# Patient Record
Sex: Male | Born: 1935 | Race: White | Hispanic: No
Health system: Southern US, Community
[De-identification: ages and names within clinical notes are randomized; demographics above are authoritative.]

## PROBLEM LIST (undated history)

## (undated) ENCOUNTER — Emergency Department (HOSPITAL_COMMUNITY): Payer: Self-pay | Source: Home / Self Care

## (undated) DIAGNOSIS — L578 Other skin changes due to chronic exposure to nonionizing radiation: Secondary | ICD-10-CM

## (undated) DIAGNOSIS — E785 Hyperlipidemia, unspecified: Secondary | ICD-10-CM

## (undated) DIAGNOSIS — I10 Essential (primary) hypertension: Secondary | ICD-10-CM

## (undated) DIAGNOSIS — I712 Thoracic aortic aneurysm, without rupture, unspecified: Secondary | ICD-10-CM

## (undated) DIAGNOSIS — K635 Polyp of colon: Secondary | ICD-10-CM

## (undated) DIAGNOSIS — K648 Other hemorrhoids: Secondary | ICD-10-CM

## (undated) DIAGNOSIS — I4891 Unspecified atrial fibrillation: Secondary | ICD-10-CM

## (undated) DIAGNOSIS — Z79899 Other long term (current) drug therapy: Secondary | ICD-10-CM

## (undated) DIAGNOSIS — J189 Pneumonia, unspecified organism: Secondary | ICD-10-CM

## (undated) DIAGNOSIS — I493 Ventricular premature depolarization: Secondary | ICD-10-CM

## (undated) DIAGNOSIS — I739 Peripheral vascular disease, unspecified: Secondary | ICD-10-CM

## (undated) DIAGNOSIS — R911 Solitary pulmonary nodule: Secondary | ICD-10-CM

## (undated) DIAGNOSIS — E7800 Pure hypercholesterolemia, unspecified: Secondary | ICD-10-CM

## (undated) DIAGNOSIS — K5730 Diverticulosis of large intestine without perforation or abscess without bleeding: Secondary | ICD-10-CM

## (undated) DIAGNOSIS — M1990 Unspecified osteoarthritis, unspecified site: Secondary | ICD-10-CM

## (undated) DIAGNOSIS — J449 Chronic obstructive pulmonary disease, unspecified: Secondary | ICD-10-CM

## (undated) DIAGNOSIS — C61 Malignant neoplasm of prostate: Secondary | ICD-10-CM

## (undated) DIAGNOSIS — I509 Heart failure, unspecified: Secondary | ICD-10-CM

## (undated) HISTORY — DX: Thoracic aortic aneurysm, without rupture, unspecified: I71.20

## (undated) HISTORY — DX: Pure hypercholesterolemia, unspecified: E78.00

## (undated) HISTORY — DX: Heart failure, unspecified: I50.9

## (undated) HISTORY — DX: Polyp of colon: K63.5

## (undated) HISTORY — DX: Diverticulosis of large intestine without perforation or abscess without bleeding: K57.30

## (undated) HISTORY — DX: Other hemorrhoids: K64.8

## (undated) HISTORY — DX: Other long term (current) drug therapy: Z79.899

## (undated) HISTORY — DX: Malignant neoplasm of prostate: C61

## (undated) HISTORY — DX: Solitary pulmonary nodule: R91.1

## (undated) HISTORY — DX: Unspecified atrial fibrillation: I48.91

## (undated) HISTORY — DX: Essential (primary) hypertension: I10

## (undated) HISTORY — DX: Hyperlipidemia, unspecified: E78.5

## (undated) HISTORY — DX: Ventricular premature depolarization: I49.3

## (undated) HISTORY — DX: Chronic obstructive pulmonary disease, unspecified: J44.9

## (undated) HISTORY — DX: Peripheral vascular disease, unspecified: I73.9

## (undated) HISTORY — DX: Pneumonia, unspecified organism: J18.9

## (undated) HISTORY — PX: JOINT REPLACEMENT: SHX530

## (undated) HISTORY — PX: APPENDECTOMY: SHX54

## (undated) HISTORY — DX: Other skin changes due to chronic exposure to nonionizing radiation: L57.8

## (undated) HISTORY — DX: Thoracic aortic aneurysm, without rupture: I71.2

## (undated) HISTORY — PX: TOTAL KNEE ARTHROPLASTY: SHX125

## (undated) HISTORY — DX: Unspecified osteoarthritis, unspecified site: M19.90

## (undated) HISTORY — PX: INGUINAL HERNIA REPAIR: SUR1180

---

## 1996-09-25 HISTORY — PX: ABDOMINAL AORTIC ANEURYSM REPAIR: SUR1152

## 1999-02-22 ENCOUNTER — Other Ambulatory Visit: Admission: RE | Admit: 1999-02-22 | Discharge: 1999-02-22 | Payer: Self-pay | Admitting: Internal Medicine

## 2003-10-13 ENCOUNTER — Inpatient Hospital Stay (HOSPITAL_COMMUNITY): Admission: RE | Admit: 2003-10-13 | Discharge: 2003-10-16 | Payer: Self-pay | Admitting: Orthopedic Surgery

## 2004-07-29 ENCOUNTER — Ambulatory Visit: Payer: Self-pay

## 2004-08-12 ENCOUNTER — Ambulatory Visit: Payer: Self-pay | Admitting: Cardiovascular Disease

## 2004-08-12 ENCOUNTER — Ambulatory Visit (HOSPITAL_COMMUNITY): Admission: RE | Admit: 2004-08-12 | Discharge: 2004-08-12 | Payer: Self-pay | Admitting: Cardiovascular Disease

## 2004-11-30 ENCOUNTER — Ambulatory Visit: Payer: Self-pay | Admitting: Pulmonary Disease

## 2005-01-11 ENCOUNTER — Ambulatory Visit: Payer: Self-pay | Admitting: Pulmonary Disease

## 2005-04-12 ENCOUNTER — Ambulatory Visit: Payer: Self-pay | Admitting: Pulmonary Disease

## 2005-04-14 ENCOUNTER — Ambulatory Visit: Payer: Self-pay | Admitting: Internal Medicine

## 2005-05-11 ENCOUNTER — Encounter: Admission: RE | Admit: 2005-05-11 | Discharge: 2005-05-11 | Payer: Self-pay | Admitting: Cardiothoracic Surgery

## 2005-05-31 ENCOUNTER — Ambulatory Visit: Payer: Self-pay | Admitting: Internal Medicine

## 2005-07-12 ENCOUNTER — Emergency Department (HOSPITAL_COMMUNITY): Admission: EM | Admit: 2005-07-12 | Discharge: 2005-07-12 | Payer: Self-pay | Admitting: Emergency Medicine

## 2005-08-14 ENCOUNTER — Ambulatory Visit: Payer: Self-pay | Admitting: Pulmonary Disease

## 2006-02-12 ENCOUNTER — Ambulatory Visit: Payer: Self-pay | Admitting: Pulmonary Disease

## 2006-02-13 ENCOUNTER — Ambulatory Visit: Payer: Self-pay | Admitting: Pulmonary Disease

## 2006-05-17 ENCOUNTER — Encounter: Admission: RE | Admit: 2006-05-17 | Discharge: 2006-05-17 | Payer: Self-pay | Admitting: Cardiothoracic Surgery

## 2006-08-15 ENCOUNTER — Ambulatory Visit: Payer: Self-pay | Admitting: Pulmonary Disease

## 2007-02-13 ENCOUNTER — Ambulatory Visit: Payer: Self-pay | Admitting: Pulmonary Disease

## 2007-02-13 LAB — CONVERTED CEMR LAB
ALT: 35 units/L (ref 0–40)
AST: 43 units/L — ABNORMAL HIGH (ref 0–37)
Albumin: 4 g/dL (ref 3.5–5.2)
Alkaline Phosphatase: 109 units/L (ref 39–117)
BUN: 16 mg/dL (ref 6–23)
Basophils Absolute: 0.1 10*3/uL (ref 0.0–0.1)
Basophils Relative: 1.3 % — ABNORMAL HIGH (ref 0.0–1.0)
Bilirubin, Direct: 0.4 mg/dL — ABNORMAL HIGH (ref 0.0–0.3)
CO2: 30 meq/L (ref 19–32)
Calcium: 9.4 mg/dL (ref 8.4–10.5)
Chloride: 108 meq/L (ref 96–112)
Cholesterol: 118 mg/dL (ref 0–200)
Creatinine, Ser: 1.1 mg/dL (ref 0.4–1.5)
Eosinophils Absolute: 0.1 10*3/uL (ref 0.0–0.6)
Eosinophils Relative: 1.7 % (ref 0.0–5.0)
GFR calc Af Amer: 83 mL/min
GFR calc non Af Amer: 68 mL/min
Glucose, Bld: 88 mg/dL (ref 70–99)
HCT: 44.8 % (ref 39.0–52.0)
HDL: 30.9 mg/dL — ABNORMAL LOW (ref >39.0)
Hemoglobin: 15.4 g/dL (ref 13.0–17.0)
LDL Cholesterol: 74 mg/dL (ref 0–99)
Lymphocytes Relative: 28.3 % (ref 12.0–46.0)
MCHC: 34.3 g/dL (ref 30.0–36.0)
MCV: 93.4 fL (ref 78.0–100.0)
Monocytes Absolute: 0.8 10*3/uL — ABNORMAL HIGH (ref 0.2–0.7)
Monocytes Relative: 13 % — ABNORMAL HIGH (ref 3.0–11.0)
Neutro Abs: 3.2 10*3/uL (ref 1.4–7.7)
Neutrophils Relative %: 55.7 % (ref 43.0–77.0)
PSA: 3.52 ng/mL (ref 0.10–4.00)
Platelets: 196 10*3/uL (ref 150–400)
Potassium: 4.8 meq/L (ref 3.5–5.1)
RBC: 4.8 M/uL (ref 4.22–5.81)
RDW: 13.2 % (ref 11.5–14.6)
Sodium: 142 meq/L (ref 135–145)
TSH: 2.81 microintl units/mL (ref 0.35–5.50)
Total Bilirubin: 2.9 mg/dL — ABNORMAL HIGH (ref 0.3–1.2)
Total CHOL/HDL Ratio: 3.8
Total Protein: 7 g/dL (ref 6.0–8.3)
Triglycerides: 66 mg/dL (ref 0–149)
VLDL: 13 mg/dL (ref 0–40)
WBC: 5.9 10*3/uL (ref 4.5–10.5)

## 2007-03-18 ENCOUNTER — Ambulatory Visit (HOSPITAL_COMMUNITY): Admission: RE | Admit: 2007-03-18 | Discharge: 2007-03-18 | Payer: Self-pay | Admitting: Urology

## 2007-04-01 ENCOUNTER — Ambulatory Visit: Admission: RE | Admit: 2007-04-01 | Discharge: 2007-06-25 | Payer: Self-pay | Admitting: Radiation Oncology

## 2007-07-25 ENCOUNTER — Ambulatory Visit: Payer: Self-pay | Admitting: Cardiothoracic Surgery

## 2007-07-25 ENCOUNTER — Encounter: Admission: RE | Admit: 2007-07-25 | Discharge: 2007-07-25 | Payer: Self-pay | Admitting: Cardiothoracic Surgery

## 2007-08-16 ENCOUNTER — Ambulatory Visit: Payer: Self-pay | Admitting: Pulmonary Disease

## 2007-08-19 ENCOUNTER — Encounter: Payer: Self-pay | Admitting: Pulmonary Disease

## 2007-08-19 DIAGNOSIS — E7800 Pure hypercholesterolemia, unspecified: Secondary | ICD-10-CM | POA: Insufficient documentation

## 2007-08-19 DIAGNOSIS — I10 Essential (primary) hypertension: Secondary | ICD-10-CM | POA: Insufficient documentation

## 2007-08-19 DIAGNOSIS — M1990 Unspecified osteoarthritis, unspecified site: Secondary | ICD-10-CM | POA: Insufficient documentation

## 2007-10-23 ENCOUNTER — Encounter: Payer: Self-pay | Admitting: Pulmonary Disease

## 2007-10-25 ENCOUNTER — Ambulatory Visit: Admission: RE | Admit: 2007-10-25 | Discharge: 2008-01-23 | Payer: Self-pay | Admitting: Radiation Oncology

## 2007-10-29 ENCOUNTER — Encounter: Payer: Self-pay | Admitting: Pulmonary Disease

## 2007-12-12 ENCOUNTER — Encounter: Payer: Self-pay | Admitting: Pulmonary Disease

## 2008-01-23 ENCOUNTER — Ambulatory Visit: Admission: RE | Admit: 2008-01-23 | Discharge: 2008-02-09 | Payer: Self-pay | Admitting: Radiation Oncology

## 2008-02-04 ENCOUNTER — Encounter: Payer: Self-pay | Admitting: Pulmonary Disease

## 2008-02-12 ENCOUNTER — Ambulatory Visit: Payer: Self-pay | Admitting: Pulmonary Disease

## 2008-02-12 DIAGNOSIS — I739 Peripheral vascular disease, unspecified: Secondary | ICD-10-CM | POA: Insufficient documentation

## 2008-02-12 DIAGNOSIS — J4489 Other specified chronic obstructive pulmonary disease: Secondary | ICD-10-CM | POA: Insufficient documentation

## 2008-02-12 DIAGNOSIS — K5730 Diverticulosis of large intestine without perforation or abscess without bleeding: Secondary | ICD-10-CM | POA: Insufficient documentation

## 2008-02-12 DIAGNOSIS — L578 Other skin changes due to chronic exposure to nonionizing radiation: Secondary | ICD-10-CM | POA: Insufficient documentation

## 2008-02-12 DIAGNOSIS — D126 Benign neoplasm of colon, unspecified: Secondary | ICD-10-CM | POA: Insufficient documentation

## 2008-02-12 DIAGNOSIS — J449 Chronic obstructive pulmonary disease, unspecified: Secondary | ICD-10-CM | POA: Insufficient documentation

## 2008-02-12 DIAGNOSIS — C61 Malignant neoplasm of prostate: Secondary | ICD-10-CM | POA: Insufficient documentation

## 2008-02-16 LAB — CONVERTED CEMR LAB
ALT: 32 units/L (ref 0–53)
AST: 40 units/L — ABNORMAL HIGH (ref 0–37)
Albumin: 4.1 g/dL (ref 3.5–5.2)
Alkaline Phosphatase: 95 units/L (ref 39–117)
BUN: 15 mg/dL (ref 6–23)
Basophils Absolute: 0 10*3/uL (ref 0.0–0.1)
Basophils Relative: 0.2 % (ref 0.0–1.0)
Bilirubin, Direct: 0.4 mg/dL — ABNORMAL HIGH (ref 0.0–0.3)
CO2: 30 meq/L (ref 19–32)
Calcium: 9.8 mg/dL (ref 8.4–10.5)
Chloride: 105 meq/L (ref 96–112)
Cholesterol: 126 mg/dL (ref 0–200)
Creatinine, Ser: 1.3 mg/dL (ref 0.4–1.5)
Eosinophils Absolute: 0.1 10*3/uL (ref 0.0–0.7)
Eosinophils Relative: 1.6 % (ref 0.0–5.0)
GFR calc Af Amer: 68 mL/min
GFR calc non Af Amer: 56 mL/min
Glucose, Bld: 108 mg/dL — ABNORMAL HIGH (ref 70–99)
HCT: 48.5 % (ref 39.0–52.0)
HDL: 37.1 mg/dL — ABNORMAL LOW (ref >39.0)
Hemoglobin: 16.1 g/dL (ref 13.0–17.0)
LDL Cholesterol: 75 mg/dL (ref 0–99)
Lymphocytes Relative: 14.4 % (ref 12.0–46.0)
MCHC: 33.3 g/dL (ref 30.0–36.0)
MCV: 96.1 fL (ref 78.0–100.0)
Monocytes Absolute: 0.9 10*3/uL (ref 0.1–1.0)
Monocytes Relative: 11.5 % (ref 3.0–12.0)
Neutro Abs: 5.5 10*3/uL (ref 1.4–7.7)
Neutrophils Relative %: 72.3 % (ref 43.0–77.0)
Platelets: 176 10*3/uL (ref 150–400)
Potassium: 5.1 meq/L (ref 3.5–5.1)
RBC: 5.04 M/uL (ref 4.22–5.81)
RDW: 13.5 % (ref 11.5–14.6)
Sodium: 142 meq/L (ref 135–145)
TSH: 3 microintl units/mL (ref 0.35–5.50)
Total Bilirubin: 2.6 mg/dL — ABNORMAL HIGH (ref 0.3–1.2)
Total CHOL/HDL Ratio: 3.4
Total Protein: 7.3 g/dL (ref 6.0–8.3)
Triglycerides: 71 mg/dL (ref 0–149)
VLDL: 14 mg/dL (ref 0–40)
WBC: 7.6 10*3/uL (ref 4.5–10.5)

## 2008-03-11 ENCOUNTER — Encounter: Payer: Self-pay | Admitting: Pulmonary Disease

## 2008-03-16 ENCOUNTER — Telehealth (INDEPENDENT_AMBULATORY_CARE_PROVIDER_SITE_OTHER): Payer: Self-pay | Admitting: *Deleted

## 2008-03-17 ENCOUNTER — Ambulatory Visit: Payer: Self-pay | Admitting: Pulmonary Disease

## 2008-03-17 ENCOUNTER — Encounter: Payer: Self-pay | Admitting: Adult Health

## 2008-03-17 DIAGNOSIS — I4949 Other premature depolarization: Secondary | ICD-10-CM | POA: Insufficient documentation

## 2008-03-18 ENCOUNTER — Ambulatory Visit: Payer: Self-pay | Admitting: Cardiovascular Disease

## 2008-03-24 ENCOUNTER — Ambulatory Visit: Payer: Self-pay

## 2008-03-24 ENCOUNTER — Encounter: Payer: Self-pay | Admitting: Cardiovascular Disease

## 2008-03-24 ENCOUNTER — Encounter: Payer: Self-pay | Admitting: Pulmonary Disease

## 2008-05-07 ENCOUNTER — Ambulatory Visit: Payer: Self-pay | Admitting: Cardiovascular Disease

## 2008-05-07 LAB — CONVERTED CEMR LAB
BUN: 22 mg/dL (ref 6–23)
CO2: 28 meq/L (ref 19–32)
Calcium: 9.4 mg/dL (ref 8.4–10.5)
Chloride: 107 meq/L (ref 96–112)
Creatinine, Ser: 1.1 mg/dL (ref 0.4–1.5)
GFR calc Af Amer: 82 mL/min
GFR calc non Af Amer: 68 mL/min
Glucose, Bld: 98 mg/dL (ref 70–99)
Potassium: 4.4 meq/L (ref 3.5–5.1)
Sodium: 141 meq/L (ref 135–145)

## 2008-07-29 ENCOUNTER — Ambulatory Visit: Payer: Self-pay | Admitting: Cardiovascular Disease

## 2008-08-07 ENCOUNTER — Encounter: Payer: Self-pay | Admitting: Pulmonary Disease

## 2008-08-18 ENCOUNTER — Encounter: Payer: Self-pay | Admitting: Pulmonary Disease

## 2008-08-26 ENCOUNTER — Ambulatory Visit: Payer: Self-pay | Admitting: Pulmonary Disease

## 2009-01-14 ENCOUNTER — Encounter: Payer: Self-pay | Admitting: Pulmonary Disease

## 2009-01-14 ENCOUNTER — Ambulatory Visit: Payer: Self-pay | Admitting: Cardiothoracic Surgery

## 2009-01-14 ENCOUNTER — Encounter: Admission: RE | Admit: 2009-01-14 | Discharge: 2009-01-14 | Payer: Self-pay | Admitting: Cardiothoracic Surgery

## 2009-02-08 ENCOUNTER — Encounter: Payer: Self-pay | Admitting: Pulmonary Disease

## 2009-02-10 ENCOUNTER — Ambulatory Visit: Payer: Self-pay | Admitting: Cardiovascular Disease

## 2009-03-15 ENCOUNTER — Ambulatory Visit: Payer: Self-pay | Admitting: Pulmonary Disease

## 2009-03-17 LAB — CONVERTED CEMR LAB
ALT: 30 units/L (ref 0–53)
AST: 41 units/L — ABNORMAL HIGH (ref 0–37)
Albumin: 3.8 g/dL (ref 3.5–5.2)
Alkaline Phosphatase: 120 units/L — ABNORMAL HIGH (ref 39–117)
BUN: 16 mg/dL (ref 6–23)
Basophils Absolute: 0 10*3/uL (ref 0.0–0.1)
Basophils Relative: 0.1 % (ref 0.0–3.0)
Bilirubin, Direct: 0.4 mg/dL — ABNORMAL HIGH (ref 0.0–0.3)
CO2: 30 meq/L (ref 19–32)
Calcium: 9.2 mg/dL (ref 8.4–10.5)
Chloride: 104 meq/L (ref 96–112)
Cholesterol: 116 mg/dL (ref 0–200)
Creatinine, Ser: 1.1 mg/dL (ref 0.4–1.5)
Eosinophils Absolute: 0.2 10*3/uL (ref 0.0–0.7)
Eosinophils Relative: 3.3 % (ref 0.0–5.0)
GFR calc non Af Amer: 67.88 mL/min (ref >60)
Glucose, Bld: 99 mg/dL (ref 70–99)
HCT: 42.4 % (ref 39.0–52.0)
HDL: 39.7 mg/dL (ref >39.00)
Hemoglobin: 14.9 g/dL (ref 13.0–17.0)
LDL Cholesterol: 62 mg/dL (ref 0–99)
Lymphocytes Relative: 19.6 % (ref 12.0–46.0)
Lymphs Abs: 1.4 10*3/uL (ref 0.7–4.0)
MCHC: 35.1 g/dL (ref 30.0–36.0)
MCV: 95.8 fL (ref 78.0–100.0)
Monocytes Absolute: 0.7 10*3/uL (ref 0.1–1.0)
Monocytes Relative: 10.4 % (ref 3.0–12.0)
Neutro Abs: 4.6 10*3/uL (ref 1.4–7.7)
Neutrophils Relative %: 66.6 % (ref 43.0–77.0)
Platelets: 171 10*3/uL (ref 150.0–400.0)
Potassium: 4.6 meq/L (ref 3.5–5.1)
RBC: 4.43 M/uL (ref 4.22–5.81)
RDW: 13.5 % (ref 11.5–14.6)
Sodium: 143 meq/L (ref 135–145)
TSH: 2.61 microintl units/mL (ref 0.35–5.50)
Total Bilirubin: 2.8 mg/dL — ABNORMAL HIGH (ref 0.3–1.2)
Total CHOL/HDL Ratio: 3
Total Protein: 7.1 g/dL (ref 6.0–8.3)
Triglycerides: 73 mg/dL (ref 0.0–149.0)
VLDL: 14.6 mg/dL (ref 0.0–40.0)
WBC: 6.9 10*3/uL (ref 4.5–10.5)

## 2009-08-11 ENCOUNTER — Encounter: Payer: Self-pay | Admitting: Pulmonary Disease

## 2009-09-13 ENCOUNTER — Ambulatory Visit: Payer: Self-pay | Admitting: Pulmonary Disease

## 2009-10-13 ENCOUNTER — Telehealth (INDEPENDENT_AMBULATORY_CARE_PROVIDER_SITE_OTHER): Payer: Self-pay | Admitting: *Deleted

## 2009-10-13 ENCOUNTER — Ambulatory Visit: Payer: Self-pay | Admitting: Pulmonary Disease

## 2009-10-25 ENCOUNTER — Ambulatory Visit: Payer: Self-pay | Admitting: Pulmonary Disease

## 2009-10-25 ENCOUNTER — Telehealth (INDEPENDENT_AMBULATORY_CARE_PROVIDER_SITE_OTHER): Payer: Self-pay | Admitting: *Deleted

## 2009-10-25 DIAGNOSIS — R0602 Shortness of breath: Secondary | ICD-10-CM | POA: Insufficient documentation

## 2009-10-25 LAB — CONVERTED CEMR LAB
BUN: 15 mg/dL (ref 6–23)
Basophils Absolute: 0 10*3/uL (ref 0.0–0.1)
Basophils Relative: 0 % (ref 0.0–3.0)
Bilirubin Urine: NEGATIVE
CO2: 30 meq/L (ref 19–32)
Calcium: 9.6 mg/dL (ref 8.4–10.5)
Chloride: 103 meq/L (ref 96–112)
Creatinine, Ser: 1.2 mg/dL (ref 0.4–1.5)
Eosinophils Absolute: 0.1 10*3/uL (ref 0.0–0.7)
Eosinophils Relative: 1.2 % (ref 0.0–5.0)
Folate: 16.5 ng/mL
GFR calc non Af Amer: 61.3 mL/min (ref >60)
Glucose, Bld: 92 mg/dL (ref 70–99)
HCT: 44.1 % (ref 39.0–52.0)
Hemoglobin, Urine: NEGATIVE
Hemoglobin: 14.3 g/dL (ref 13.0–17.0)
Iron: 75 ug/dL (ref 42–165)
Ketones, ur: NEGATIVE mg/dL
Leukocytes, UA: NEGATIVE
Lymphocytes Relative: 16 % (ref 12.0–46.0)
Lymphs Abs: 1.3 10*3/uL (ref 0.7–4.0)
MCHC: 32.4 g/dL (ref 30.0–36.0)
MCV: 99.8 fL (ref 78.0–100.0)
Monocytes Absolute: 0.9 10*3/uL (ref 0.1–1.0)
Monocytes Relative: 10.9 % (ref 3.0–12.0)
Neutro Abs: 6 10*3/uL (ref 1.4–7.7)
Neutrophils Relative %: 71.9 % (ref 43.0–77.0)
Nitrite: NEGATIVE
Platelets: 179 10*3/uL (ref 150.0–400.0)
Potassium: 4.8 meq/L (ref 3.5–5.1)
Pro B Natriuretic peptide (BNP): 586 pg/mL — ABNORMAL HIGH (ref 0.0–100.0)
RBC: 4.42 M/uL (ref 4.22–5.81)
RDW: 13.3 % (ref 11.5–14.6)
Saturation Ratios: 22.2 % (ref 20.0–50.0)
Sodium: 141 meq/L (ref 135–145)
Specific Gravity, Urine: 1.015 (ref 1.000–1.030)
TSH: 3.52 microintl units/mL (ref 0.35–5.50)
Total Protein, Urine: 30 mg/dL
Transferrin: 241.7 mg/dL (ref 212.0–360.0)
Urine Glucose: NEGATIVE mg/dL
Urobilinogen, UA: 1 (ref 0.0–1.0)
Vitamin B-12: 652 pg/mL (ref 211–911)
WBC: 8.3 10*3/uL (ref 4.5–10.5)
pH: 6.5 (ref 5.0–8.0)

## 2009-10-26 ENCOUNTER — Ambulatory Visit: Payer: Self-pay | Admitting: Cardiovascular Disease

## 2009-10-29 ENCOUNTER — Telehealth: Payer: Self-pay | Admitting: Cardiovascular Disease

## 2009-10-29 ENCOUNTER — Ambulatory Visit: Payer: Self-pay | Admitting: Cardiology

## 2009-10-29 ENCOUNTER — Inpatient Hospital Stay (HOSPITAL_COMMUNITY): Admission: EM | Admit: 2009-10-29 | Discharge: 2009-11-05 | Payer: Self-pay | Admitting: Emergency Medicine

## 2009-10-30 ENCOUNTER — Encounter (INDEPENDENT_AMBULATORY_CARE_PROVIDER_SITE_OTHER): Payer: Self-pay | Admitting: Dermatology

## 2009-11-02 ENCOUNTER — Encounter: Payer: Self-pay | Admitting: Cardiovascular Disease

## 2009-11-02 ENCOUNTER — Ambulatory Visit: Payer: Self-pay | Admitting: Cardiothoracic Surgery

## 2009-11-03 ENCOUNTER — Encounter: Payer: Self-pay | Admitting: Cardiology

## 2009-11-04 ENCOUNTER — Encounter (INDEPENDENT_AMBULATORY_CARE_PROVIDER_SITE_OTHER): Payer: Self-pay | Admitting: *Deleted

## 2009-11-05 ENCOUNTER — Encounter: Payer: Self-pay | Admitting: Cardiology

## 2009-11-05 ENCOUNTER — Telehealth (INDEPENDENT_AMBULATORY_CARE_PROVIDER_SITE_OTHER): Payer: Self-pay | Admitting: *Deleted

## 2009-11-08 ENCOUNTER — Ambulatory Visit: Payer: Self-pay | Admitting: Cardiology

## 2009-11-08 DIAGNOSIS — I4891 Unspecified atrial fibrillation: Secondary | ICD-10-CM | POA: Insufficient documentation

## 2009-11-08 LAB — CONVERTED CEMR LAB
INR: 12.6 (ref 0.8–1.0)
POC INR: 12.6
Prothrombin Time: 128.3 s (ref 9.1–11.7)

## 2009-11-11 ENCOUNTER — Ambulatory Visit: Payer: Self-pay | Admitting: Cardiology

## 2009-11-11 LAB — CONVERTED CEMR LAB: POC INR: 3.3

## 2009-11-16 ENCOUNTER — Ambulatory Visit: Payer: Self-pay | Admitting: Cardiology

## 2009-11-16 ENCOUNTER — Ambulatory Visit: Payer: Self-pay | Admitting: Pulmonary Disease

## 2009-11-16 DIAGNOSIS — I509 Heart failure, unspecified: Secondary | ICD-10-CM | POA: Insufficient documentation

## 2009-11-16 LAB — CONVERTED CEMR LAB
BUN: 17 mg/dL (ref 6–23)
CO2: 31 meq/L (ref 19–32)
Calcium: 9.5 mg/dL (ref 8.4–10.5)
Chloride: 102 meq/L (ref 96–112)
Creatinine, Ser: 1.4 mg/dL (ref 0.4–1.5)
GFR calc non Af Amer: 51.31 mL/min (ref >60)
Glucose, Bld: 98 mg/dL (ref 70–99)
POC INR: 2.6
Potassium: 4.1 meq/L (ref 3.5–5.1)
Pro B Natriuretic peptide (BNP): 630 pg/mL — ABNORMAL HIGH (ref 0.0–100.0)
Sodium: 141 meq/L (ref 135–145)

## 2009-11-23 ENCOUNTER — Ambulatory Visit: Payer: Self-pay | Admitting: Cardiovascular Disease

## 2009-11-23 LAB — CONVERTED CEMR LAB: POC INR: 2.1

## 2009-11-30 ENCOUNTER — Ambulatory Visit: Payer: Self-pay | Admitting: Cardiology

## 2009-11-30 LAB — CONVERTED CEMR LAB: POC INR: 2.4

## 2009-12-07 ENCOUNTER — Ambulatory Visit: Payer: Self-pay | Admitting: Cardiology

## 2009-12-07 LAB — CONVERTED CEMR LAB: POC INR: 2.3

## 2009-12-16 ENCOUNTER — Ambulatory Visit: Payer: Self-pay | Admitting: Cardiovascular Disease

## 2009-12-16 ENCOUNTER — Ambulatory Visit: Payer: Self-pay | Admitting: Cardiology

## 2009-12-16 LAB — CONVERTED CEMR LAB: POC INR: 1.9

## 2009-12-23 ENCOUNTER — Ambulatory Visit: Payer: Self-pay | Admitting: Cardiovascular Disease

## 2009-12-23 ENCOUNTER — Ambulatory Visit: Payer: Self-pay | Admitting: Cardiology

## 2009-12-23 LAB — CONVERTED CEMR LAB
BUN: 16 mg/dL (ref 6–23)
CO2: 29 meq/L (ref 19–32)
Calcium: 9.4 mg/dL (ref 8.4–10.5)
Chloride: 101 meq/L (ref 96–112)
Creatinine, Ser: 1.3 mg/dL (ref 0.4–1.5)
GFR calc non Af Amer: 55.87 mL/min (ref >60)
Glucose, Bld: 86 mg/dL (ref 70–99)
INR: 2.1 — ABNORMAL HIGH (ref 0.8–1.0)
POC INR: 2
Potassium: 4.1 meq/L (ref 3.5–5.1)
Prothrombin Time: 21.9 s — ABNORMAL HIGH (ref 9.1–11.7)
Sodium: 140 meq/L (ref 135–145)

## 2009-12-24 ENCOUNTER — Ambulatory Visit (HOSPITAL_COMMUNITY): Admission: RE | Admit: 2009-12-24 | Discharge: 2009-12-24 | Payer: Self-pay | Admitting: Cardiovascular Disease

## 2009-12-24 ENCOUNTER — Ambulatory Visit: Payer: Self-pay | Admitting: Cardiovascular Disease

## 2009-12-31 ENCOUNTER — Ambulatory Visit: Payer: Self-pay | Admitting: Internal Medicine

## 2009-12-31 LAB — CONVERTED CEMR LAB: POC INR: 2.6

## 2010-01-06 ENCOUNTER — Ambulatory Visit: Payer: Self-pay | Admitting: Cardiovascular Disease

## 2010-01-21 ENCOUNTER — Inpatient Hospital Stay (HOSPITAL_COMMUNITY): Admission: EM | Admit: 2010-01-21 | Discharge: 2010-01-22 | Payer: Self-pay | Admitting: Emergency Medicine

## 2010-01-24 ENCOUNTER — Telehealth (INDEPENDENT_AMBULATORY_CARE_PROVIDER_SITE_OTHER): Payer: Self-pay | Admitting: *Deleted

## 2010-01-28 ENCOUNTER — Ambulatory Visit: Payer: Self-pay | Admitting: Cardiology

## 2010-01-28 LAB — CONVERTED CEMR LAB
INR: 7.1
INR: 7.1 (ref 0.8–1.0)
POC INR: 6
Prothrombin Time: 72.8 s (ref 9.1–11.7)

## 2010-01-31 ENCOUNTER — Telehealth: Payer: Self-pay | Admitting: Cardiovascular Disease

## 2010-02-01 ENCOUNTER — Ambulatory Visit: Payer: Self-pay | Admitting: Cardiology

## 2010-02-01 LAB — CONVERTED CEMR LAB: POC INR: 3.6

## 2010-02-03 ENCOUNTER — Ambulatory Visit: Payer: Self-pay | Admitting: Pulmonary Disease

## 2010-02-10 ENCOUNTER — Ambulatory Visit: Payer: Self-pay | Admitting: Internal Medicine

## 2010-02-10 LAB — CONVERTED CEMR LAB: POC INR: 2.9

## 2010-02-11 ENCOUNTER — Encounter: Payer: Self-pay | Admitting: Pulmonary Disease

## 2010-02-25 ENCOUNTER — Ambulatory Visit: Payer: Self-pay | Admitting: Cardiovascular Disease

## 2010-03-03 ENCOUNTER — Ambulatory Visit: Payer: Self-pay | Admitting: Cardiovascular Disease

## 2010-03-10 ENCOUNTER — Ambulatory Visit: Payer: Self-pay | Admitting: Pulmonary Disease

## 2010-03-10 ENCOUNTER — Telehealth (INDEPENDENT_AMBULATORY_CARE_PROVIDER_SITE_OTHER): Payer: Self-pay | Admitting: *Deleted

## 2010-03-10 LAB — CONVERTED CEMR LAB
INR: 5.8 (ref 0.8–1.0)
Prothrombin Time: 62 s

## 2010-03-11 ENCOUNTER — Ambulatory Visit: Payer: Self-pay | Admitting: Cardiology

## 2010-03-11 LAB — CONVERTED CEMR LAB
BUN: 17 mg/dL (ref 6–23)
Basophils Absolute: 0.1 10*3/uL (ref 0.0–0.1)
Basophils Relative: 0.8 % (ref 0.0–3.0)
CO2: 30 meq/L (ref 19–32)
Calcium: 9.4 mg/dL (ref 8.4–10.5)
Chloride: 106 meq/L (ref 96–112)
Creatinine, Ser: 1.2 mg/dL (ref 0.4–1.5)
Eosinophils Absolute: 0.4 10*3/uL (ref 0.0–0.7)
Eosinophils Relative: 4.3 % (ref 0.0–5.0)
GFR calc non Af Amer: 63.06 mL/min (ref >60)
Glucose, Bld: 93 mg/dL (ref 70–99)
HCT: 41.5 % (ref 39.0–52.0)
Hemoglobin: 13.9 g/dL (ref 13.0–17.0)
Lymphocytes Relative: 18.7 % (ref 12.0–46.0)
Lymphs Abs: 1.6 10*3/uL (ref 0.7–4.0)
MCHC: 33.3 g/dL (ref 30.0–36.0)
MCV: 100 fL (ref 78.0–100.0)
Monocytes Absolute: 0.8 10*3/uL (ref 0.1–1.0)
Monocytes Relative: 9.6 % (ref 3.0–12.0)
Neutro Abs: 5.7 10*3/uL (ref 1.4–7.7)
Neutrophils Relative %: 66.6 % (ref 43.0–77.0)
Platelets: 221 10*3/uL (ref 150.0–400.0)
Potassium: 4.8 meq/L (ref 3.5–5.1)
RBC: 4.15 M/uL — ABNORMAL LOW (ref 4.22–5.81)
RDW: 16.5 % — ABNORMAL HIGH (ref 11.5–14.6)
Sodium: 143 meq/L (ref 135–145)
WBC: 8.6 10*3/uL (ref 4.5–10.5)

## 2010-03-15 ENCOUNTER — Ambulatory Visit: Payer: Self-pay | Admitting: Pulmonary Disease

## 2010-03-15 ENCOUNTER — Encounter: Payer: Self-pay | Admitting: Cardiology

## 2010-03-15 DIAGNOSIS — J984 Other disorders of lung: Secondary | ICD-10-CM | POA: Insufficient documentation

## 2010-03-16 ENCOUNTER — Telehealth (INDEPENDENT_AMBULATORY_CARE_PROVIDER_SITE_OTHER): Payer: Self-pay | Admitting: *Deleted

## 2010-03-16 LAB — CONVERTED CEMR LAB
ALT: 31 units/L (ref 0–53)
AST: 43 units/L — ABNORMAL HIGH (ref 0–37)
Albumin: 3.8 g/dL (ref 3.5–5.2)
Alkaline Phosphatase: 125 units/L — ABNORMAL HIGH (ref 39–117)
BUN: 19 mg/dL (ref 6–23)
Basophils Absolute: 0.1 10*3/uL (ref 0.0–0.1)
Basophils Relative: 0.7 % (ref 0.0–3.0)
Bilirubin, Direct: 0.5 mg/dL — ABNORMAL HIGH (ref 0.0–0.3)
CEA: 7.5 ng/mL — ABNORMAL HIGH (ref 0.0–5.0)
CO2: 29 meq/L (ref 19–32)
Calcium: 9 mg/dL (ref 8.4–10.5)
Chloride: 106 meq/L (ref 96–112)
Creatinine, Ser: 1.1 mg/dL (ref 0.4–1.5)
Eosinophils Absolute: 0.3 10*3/uL (ref 0.0–0.7)
Eosinophils Relative: 3.1 % (ref 0.0–5.0)
GFR calc non Af Amer: 67.71 mL/min (ref >60)
Glucose, Bld: 95 mg/dL (ref 70–99)
HCT: 40.9 % (ref 39.0–52.0)
Hemoglobin: 13.8 g/dL (ref 13.0–17.0)
INR: 1.4 — ABNORMAL HIGH (ref 0.8–1.0)
Lymphocytes Relative: 13.7 % (ref 12.0–46.0)
Lymphs Abs: 1.2 10*3/uL (ref 0.7–4.0)
MCHC: 33.8 g/dL (ref 30.0–36.0)
MCV: 99.2 fL (ref 78.0–100.0)
Monocytes Absolute: 0.8 10*3/uL (ref 0.1–1.0)
Monocytes Relative: 8.9 % (ref 3.0–12.0)
Neutro Abs: 6.4 10*3/uL (ref 1.4–7.7)
Neutrophils Relative %: 73.6 % (ref 43.0–77.0)
Platelets: 182 10*3/uL (ref 150.0–400.0)
Potassium: 4.1 meq/L (ref 3.5–5.1)
Prothrombin Time: 15.5 s — ABNORMAL HIGH (ref 9.7–11.8)
RBC: 4.12 M/uL — ABNORMAL LOW (ref 4.22–5.81)
RDW: 16.1 % — ABNORMAL HIGH (ref 11.5–14.6)
Sed Rate: 27 mm/hr — ABNORMAL HIGH (ref 0–22)
Sodium: 143 meq/L (ref 135–145)
Total Bilirubin: 2.6 mg/dL — ABNORMAL HIGH (ref 0.3–1.2)
Total Protein: 7.2 g/dL (ref 6.0–8.3)
WBC: 8.7 10*3/uL (ref 4.5–10.5)

## 2010-03-23 ENCOUNTER — Ambulatory Visit: Payer: Self-pay | Admitting: Cardiology

## 2010-03-23 LAB — CONVERTED CEMR LAB: POC INR: 1.4

## 2010-03-31 ENCOUNTER — Ambulatory Visit: Payer: Self-pay | Admitting: Internal Medicine

## 2010-03-31 LAB — CONVERTED CEMR LAB: POC INR: 3.4

## 2010-04-11 ENCOUNTER — Ambulatory Visit: Payer: Self-pay | Admitting: Cardiovascular Disease

## 2010-04-11 LAB — CONVERTED CEMR LAB: POC INR: 3.9

## 2010-04-25 ENCOUNTER — Ambulatory Visit: Payer: Self-pay | Admitting: Internal Medicine

## 2010-04-25 LAB — CONVERTED CEMR LAB: POC INR: 4.3

## 2010-05-04 ENCOUNTER — Ambulatory Visit: Payer: Self-pay | Admitting: Pulmonary Disease

## 2010-05-04 LAB — CONVERTED CEMR LAB: CEA: 6.5 ng/mL — ABNORMAL HIGH (ref 0.0–5.0)

## 2010-05-05 ENCOUNTER — Encounter (INDEPENDENT_AMBULATORY_CARE_PROVIDER_SITE_OTHER): Payer: Self-pay | Admitting: *Deleted

## 2010-05-09 ENCOUNTER — Ambulatory Visit: Payer: Self-pay | Admitting: Cardiology

## 2010-05-09 ENCOUNTER — Telehealth: Payer: Self-pay | Admitting: Pulmonary Disease

## 2010-05-09 LAB — CONVERTED CEMR LAB: POC INR: 3.2

## 2010-05-20 ENCOUNTER — Encounter: Payer: Self-pay | Admitting: Pulmonary Disease

## 2010-05-23 ENCOUNTER — Ambulatory Visit: Payer: Self-pay | Admitting: Cardiology

## 2010-05-23 LAB — CONVERTED CEMR LAB: POC INR: 2.8

## 2010-05-25 ENCOUNTER — Telehealth (INDEPENDENT_AMBULATORY_CARE_PROVIDER_SITE_OTHER): Payer: Self-pay | Admitting: *Deleted

## 2010-05-27 ENCOUNTER — Ambulatory Visit: Payer: Self-pay | Admitting: Cardiovascular Disease

## 2010-05-27 ENCOUNTER — Ambulatory Visit: Payer: Self-pay | Admitting: Cardiology

## 2010-05-27 LAB — CONVERTED CEMR LAB: POC INR: 2.2

## 2010-06-13 ENCOUNTER — Ambulatory Visit: Payer: Self-pay | Admitting: Internal Medicine

## 2010-06-13 LAB — CONVERTED CEMR LAB: POC INR: 2.8

## 2010-06-22 ENCOUNTER — Telehealth (INDEPENDENT_AMBULATORY_CARE_PROVIDER_SITE_OTHER): Payer: Self-pay | Admitting: *Deleted

## 2010-06-22 IMAGING — CR DG CHEST 2V
2 series · 2 of 2 positions shown · non-contrast
Comparison: 01/20/2010

CLINICAL DATA: Follow-up pneumonia.  COPD.  Cough.

CHEST - 2 VIEW

[view not recorded (1 of 2)]
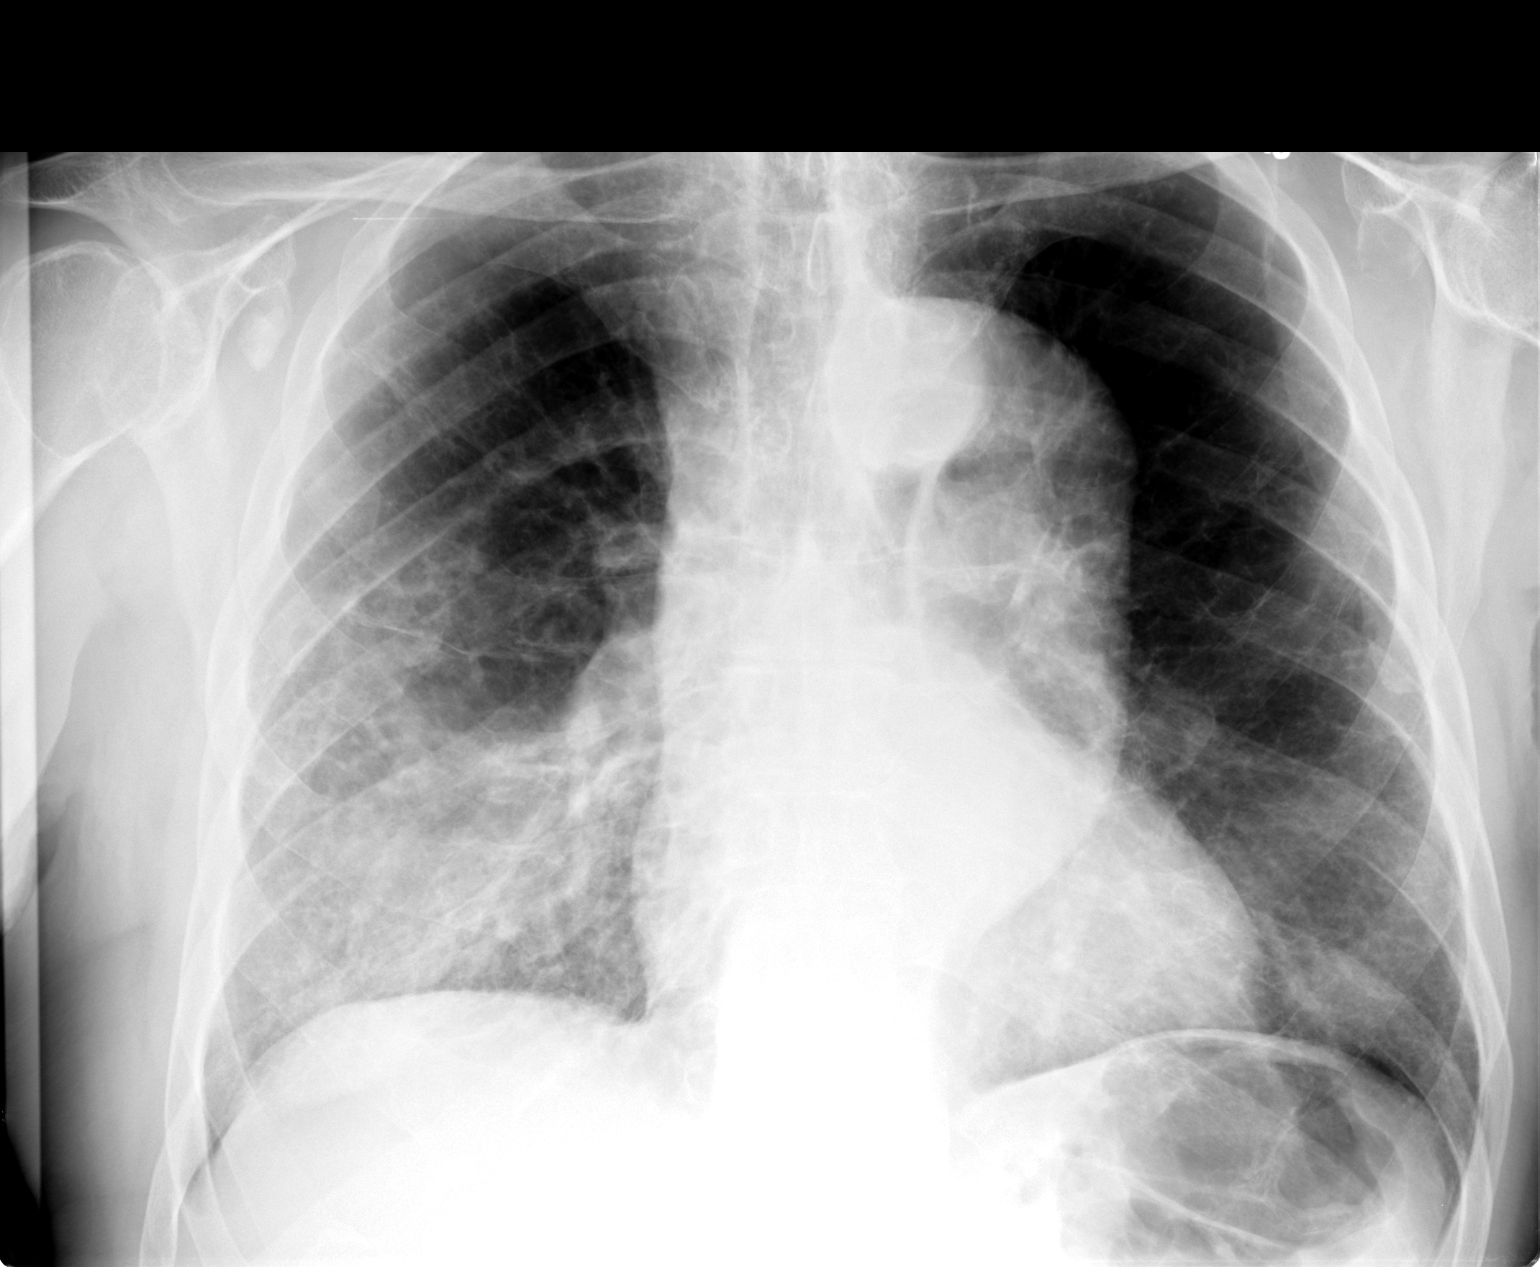

[view not recorded (2 of 2)]
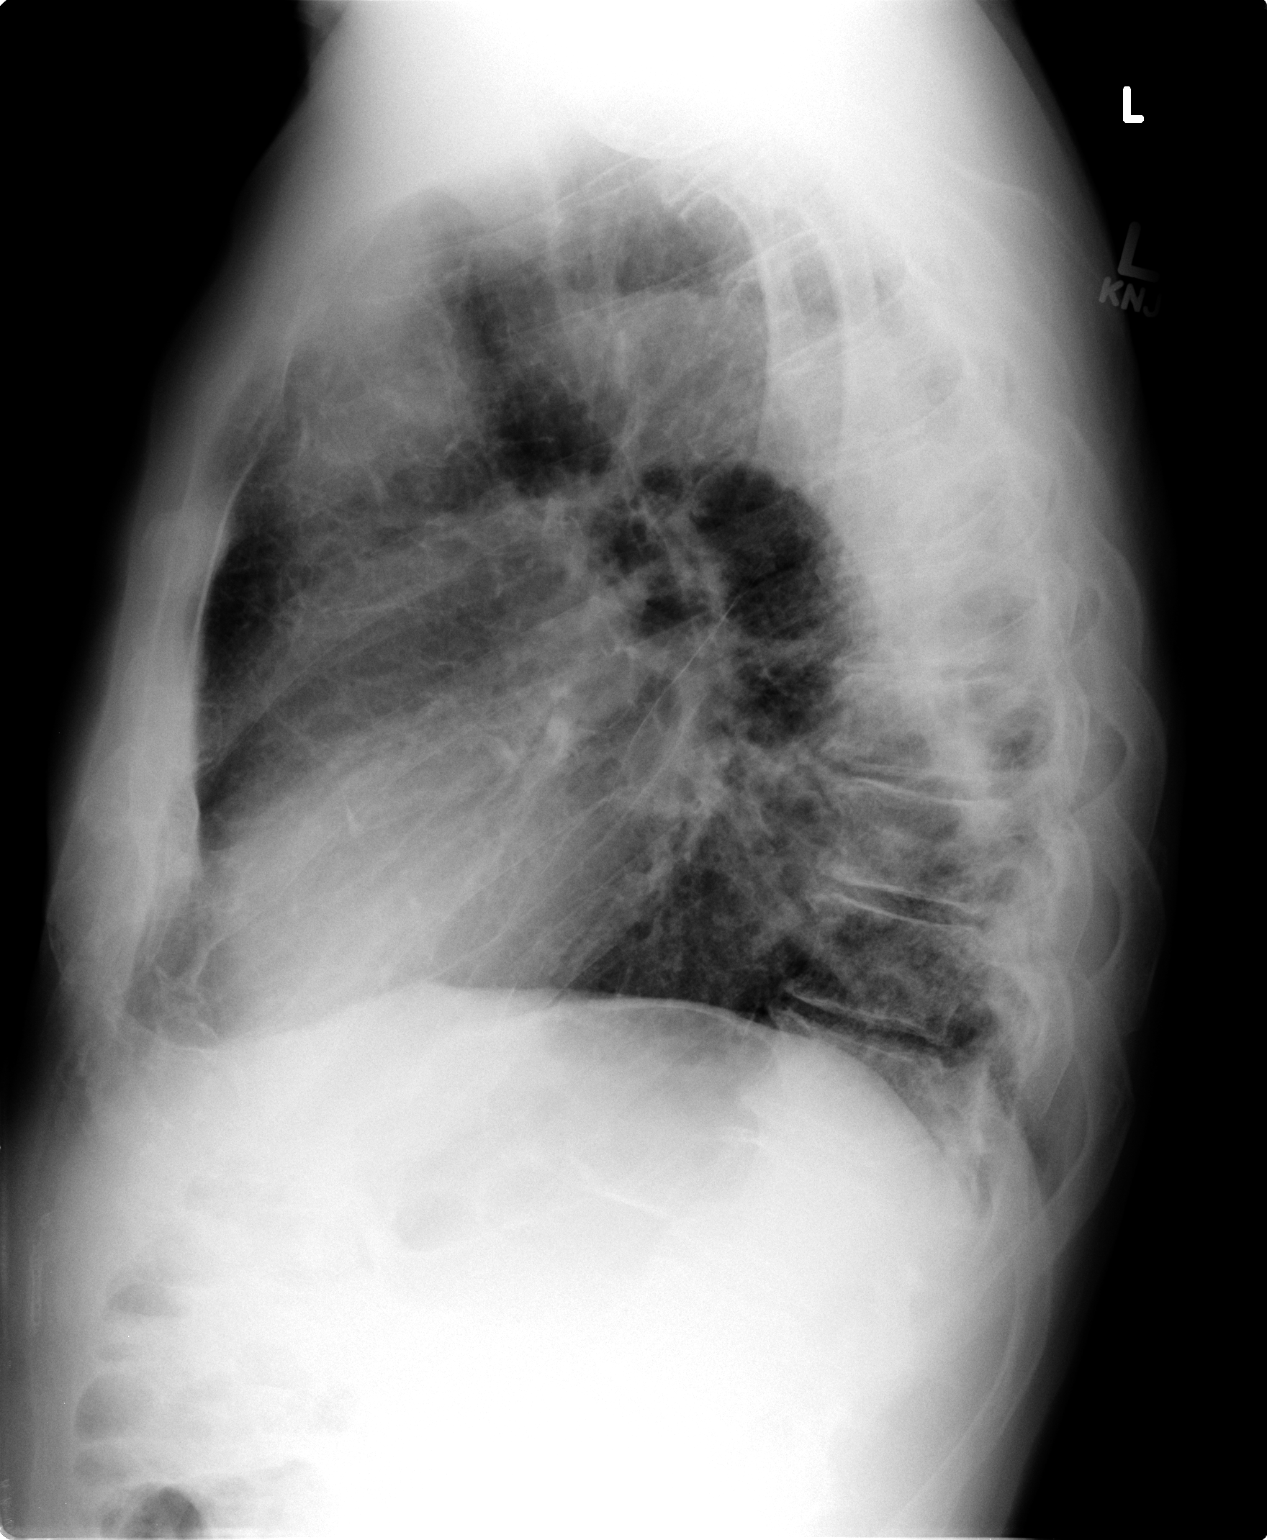

[2 of 2 positions shown; findings below may reference images not displayed]

FINDINGS: Further mild increase in right lower lobe airspace
disease is seen since previous study.  No evidence of pleural
effusion.  Heart size is normal.  Ectasia of the thoracic aorta
remains stable.
IMPRESSION: Further interval worsening of right lower lobe airspace disease.
Consider chest CT with contrast for further evaluation.

## 2010-07-04 ENCOUNTER — Ambulatory Visit: Payer: Self-pay | Admitting: Cardiology

## 2010-07-04 ENCOUNTER — Ambulatory Visit: Payer: Self-pay | Admitting: Pulmonary Disease

## 2010-07-04 LAB — CONVERTED CEMR LAB
BUN: 14 mg/dL (ref 6–23)
CO2: 26 meq/L (ref 19–32)
Calcium: 9.6 mg/dL (ref 8.4–10.5)
Chloride: 104 meq/L (ref 96–112)
Creatinine, Ser: 1.1 mg/dL (ref 0.4–1.5)
GFR calc non Af Amer: 66.96 mL/min (ref >60)
Glucose, Bld: 92 mg/dL (ref 70–99)
POC INR: 3.4
Potassium: 4.2 meq/L (ref 3.5–5.1)
Sodium: 140 meq/L (ref 135–145)

## 2010-07-12 ENCOUNTER — Ambulatory Visit: Payer: Self-pay | Admitting: Internal Medicine

## 2010-07-14 ENCOUNTER — Ambulatory Visit: Payer: Self-pay | Admitting: Cardiothoracic Surgery

## 2010-07-14 ENCOUNTER — Encounter: Payer: Self-pay | Admitting: Pulmonary Disease

## 2010-07-15 ENCOUNTER — Ambulatory Visit: Payer: Self-pay | Admitting: Pulmonary Disease

## 2010-07-15 DIAGNOSIS — R1319 Other dysphagia: Secondary | ICD-10-CM | POA: Insufficient documentation

## 2010-07-20 ENCOUNTER — Ambulatory Visit (HOSPITAL_COMMUNITY): Admission: RE | Admit: 2010-07-20 | Discharge: 2010-07-20 | Payer: Self-pay | Admitting: Pulmonary Disease

## 2010-07-25 ENCOUNTER — Ambulatory Visit: Payer: Self-pay | Admitting: Internal Medicine

## 2010-07-25 LAB — CONVERTED CEMR LAB: POC INR: 1.5

## 2010-08-15 ENCOUNTER — Telehealth: Payer: Self-pay | Admitting: Cardiovascular Disease

## 2010-08-16 ENCOUNTER — Ambulatory Visit: Payer: Self-pay | Admitting: Cardiology

## 2010-08-16 LAB — CONVERTED CEMR LAB: POC INR: 3

## 2010-08-17 ENCOUNTER — Telehealth (INDEPENDENT_AMBULATORY_CARE_PROVIDER_SITE_OTHER): Payer: Self-pay | Admitting: *Deleted

## 2010-08-24 ENCOUNTER — Ambulatory Visit: Payer: Self-pay | Admitting: Internal Medicine

## 2010-08-24 LAB — CONVERTED CEMR LAB: POC INR: 3.2

## 2010-09-02 ENCOUNTER — Ambulatory Visit: Payer: Self-pay | Admitting: Internal Medicine

## 2010-09-02 LAB — CONVERTED CEMR LAB: POC INR: 2.5

## 2010-09-13 ENCOUNTER — Ambulatory Visit: Payer: Self-pay | Admitting: Cardiology

## 2010-09-13 LAB — CONVERTED CEMR LAB: POC INR: 3.3

## 2010-10-04 ENCOUNTER — Ambulatory Visit: Admission: RE | Admit: 2010-10-04 | Discharge: 2010-10-04 | Payer: Self-pay | Source: Home / Self Care

## 2010-10-04 LAB — CONVERTED CEMR LAB: POC INR: 2.8

## 2010-10-15 ENCOUNTER — Encounter: Payer: Self-pay | Admitting: Pulmonary Disease

## 2010-10-16 ENCOUNTER — Encounter: Payer: Self-pay | Admitting: Cardiothoracic Surgery

## 2010-10-25 NOTE — Medication Information (Signed)
Summary: rov/sp  Anticoagulant Therapy  Managed by: Weston Brass, PharmD Referring MD: [name] MD, Lavera Guise MD: Gala Romney MD, Reuel Boom Indication 1: Atrial Fibrillation Lab Used: [facility] [place] Site: [address] INR POC 6.0 INR RANGE 2.0-3.0  Dietary changes: no    Health status changes: no    Bleeding/hemorrhagic complications: no    Recent/future hospitalizations: yes       Details: discharged 4/30 with PNA  Any changes in medication regimen? yes       Details: was on Avelox for 5 days last week for PNA  Recent/future dental: no  Any missed doses?: no       Is patient compliant with meds? yes       Allergies: No Known Drug Allergies  Anticoagulation Management History:      The patient is taking warfarin and comes in today for a routine follow up visit.  Positive risk factors for bleeding include an age of 75 years or older.  The bleeding index is 'intermediate risk'.  Positive CHADS2 values include History of CHF, History of HTN, and Age > 102 years old.  His last INR was 2.1 ratio and today's INR is 7.1.  Anticoagulation responsible provider: Bensimhon MD, Reuel Boom.  INR POC: 6.0.  Exp: 01/2011.    Anticoagulation Management Assessment/Plan:      The patient's current anticoagulation dose is Warfarin sodium 2.5 mg tabs: take as directed.  The target INR is 2.0-3.0.  The next INR is due 02/01/2010.  Anticoagulation instructions were given to patient.  Results were reviewed/authorized by Weston Brass, PharmD.  He was notified by Weston Brass PharmD.         Prior Anticoagulation Instructions: INR 2.6  Continue same dose of 1 tablet every day except 1/2 tablet on Friday   Current Anticoagulation Instructions: INR 6.0- sent to Lab INR 7.1 from lab  Spoke with pt.  Hold Coumadin today, tomorrow, and Sunday then take 1/2 tablet on Monday.  Appt with [facility]in Clinic on Tuesday.  He is aware to go to ER with any signs of bleeding

## 2010-10-25 NOTE — Medication Information (Signed)
Summary: rov/sp  Anticoagulant Therapy  Managed by: Romeo Rabon, PharmD Referring MD: Johnsie Cancel MD, Evelena Asa MD: Haroldine Laws MD, Quillian Quince Indication 1: Atrial Fibrillation Lab Used: [facility] Site: [place] INR POC 2.9 INR RANGE 2.0-3.0  Dietary changes: no    Health status changes: no    Bleeding/hemorrhagic complications: no    Recent/future hospitalizations: no    Any changes in medication regimen? no    Recent/future dental: no  Any missed doses?: no       Is patient compliant with meds? yes       Current Medications (verified): 1)  Advair Diskus 250-50 Mcg/dose Aepb (Fluticasone-Salmeterol) .[name].. 1 Inhalation Two Times A Day... 2)  Spiriva Handihaler 18 Mcg Caps (Tiotropium Bromide Monohydrate) .... Inhale Contents of One Capsule By Handihaler Daily 3)  Warfarin Sodium 2.5 Mg Tabs (Warfarin Sodium) .... Take As Directed 4)  Adult Aspirin Ec Low Strength 81 Mg  Tbec (Aspirin) .... Once Daily 5)  Atenolol 50 Mg  Tabs (Atenolol) .... Take 1 Tab By Mouth Once Daily.Marland KitchenMarland Kitchen 6)  Losartan Potassium 100 Mg Tabs (Losartan Potassium) .... Take 1 Tab By Mouth Once Daily.Marland KitchenMarland Kitchen 7)  Lasix 20 Mg Tabs (Furosemide) .... Take 2 Tabs By Mouth Each Am... 8)  Klor-Con 10 10 Meq Cr-Tabs (Potassium Chloride) .... Take 1 Tablet By Mouth Once A Day 9)  Lipitor 10 Mg  Tabs (Atorvastatin Calcium) .... Take 1 Tab By Mouth Once Daily... 10)  Multivitamins  Tabs (Multiple Vitamin) .... Take 1 Tablet By Mouth Once A Day 11)  Mucinex D 60-600 Mg Xr12h-Tab (Pseudoephedrine-Guaifenesin) .... Take 1-2 Tablets Every 12 Hours As Needed  Allergies (verified): No Known Drug Allergies  Anticoagulation Management History:      The patient is taking warfarin and comes in today for a routine follow up visit.  Positive risk factors for bleeding include an age of 75 years or older.  The bleeding index is 'intermediate risk'.  Positive CHADS2 values include History of CHF, History of  HTN, and Age > 75 years old.  His last INR was 7.1 ratio.  Anticoagulation responsible provider: Nevae Pinnix MD, Quillian Quince.  INR POC: 2.9.  Cuvette Lot#: [id number].  Exp: 04/2011.    Anticoagulation Management Assessment/Plan:      The patient's current anticoagulation dose is Warfarin sodium 2.5 mg tabs: take as directed.  The target INR is 2.0-3.0.  The next INR is due 03/03/2010.  Anticoagulation instructions were given to patient.  Results were reviewed/authorized by Romeo Rabon, PharmD.  He was notified by Romeo Rabon, PharmD.         Prior Anticoagulation Instructions: INR 3.6  Skip today's dose of Coumadin then decrease dose to 1 tablet every day except 1/2 tablet on Monday and Friday   Current Anticoagulation Instructions: The patient is to continue with the same dose of coumadin.  This dosage includes: '5mg'$  daily except 2.'5mg'$  Mon & Fri.

## 2010-10-25 NOTE — Assessment & Plan Note (Signed)
Summary: PER CHECK OUT/SF   CC:  no complaints, pt states he develped a little tremer in hand. I noticed it in this left hand, pt states hes doing ok, and he states he thinks its related to medication.  History of Present Illness: Ryan Hess is seen today for F/ U of afib.  he was recently hospitalized for CHF/  He had a normal cath with EF around 40%.  We attempted a TEE [facility] but he had LAA clot.  he was anticoagulated for another 5 weeks and repeat TEE showed no clot with moderate biatrial enlargement, mild MR, EF 40%.  He was cardioverted with a single 200J biphasic shock and converted.  He has felt well since D/C and his INR is Rx.  He has no bleeding problems or TIA symptoms.  His breathing is better and is now limited by COPD with his inhalers helping the most.  He denies, PND, orthopnea, edema, palpitations or syncope.   Current Problems (verified): 1)  Dyspnea  (ICD-786.05) 2)  Weakness  (ICD-780.79) 3)  COPD  (ICD-496) 4)  Hypertension  (ICD-401.9) 5)  Congestive Heart Failure  (ICD-428.0) 6)  Atrial Fibrillation  (ICD-427.31) 7)  Premature Ventricular Contractions, Frequent  (ICD-427.69) 8)  Peripheral Vascular Disease  (ICD-443.9) 9)  Thoracic Aortic Aneurysm  (ICD-441.2) 10)  Hypercholesterolemia  (ICD-272.0) 11)  Diverticulosis of Colon  (ICD-562.10) 12)  Colonic Polyps  (ICD-211.3) 13)  Prostate Cancer  (ICD-185) 14)  Degenerative Joint Disease  (ICD-715.90) 15)  Actinic Skin Damage  (ICD-692.70)  Current Medications (verified): 1)  Advair Diskus 250-50 Mcg/dose Aepb (Fluticasone-Salmeterol) .[name].. 1 Inhalation Two Times A Day... 2)  Spiriva Handihaler 18 Mcg Caps (Tiotropium Bromide Monohydrate) .... Inhale Contents of One Capsule By Handihaler Daily 3)  Warfarin Sodium 2.5 Mg Tabs (Warfarin Sodium) .... Take As Directed 4)  Adult Aspirin Ec Low Strength 81 Mg  Tbec (Aspirin) .... Once Daily 5)  Atenolol 50 Mg  Tabs (Atenolol) .... Take 1 Tab By Mouth Once Daily.Marland KitchenMarland Kitchen 6)  Losartan  Potassium 100 Mg Tabs (Losartan Potassium) .... Take 1 Tab By Mouth Once Daily.Marland KitchenMarland Kitchen 7)  Lasix 20 Mg Tabs (Furosemide) .... Take 2 Tabs By Mouth Each Am... 8)  Klor-Con 10 10 Meq Cr-Tabs (Potassium Chloride) .... Take 1 Tablet By Mouth Once A Day 9)  Lipitor 10 Mg  Tabs (Atorvastatin Calcium) .... Take 1 Tab By Mouth Once Daily... 10)  Multivitamins  Tabs (Multiple Vitamin) .... Take 1 Tablet By Mouth Once A Day  Allergies (verified): No Known Drug Allergies  Past History:  Past Medical History: Last updated: 11/16/2009  COPD (ICD-496) HYPERTENSION (ICD-401.9) CONGESTIVE HEART FAILURE (ICD-428.0) ATRIAL FIBRILLATION (ICD-427.31) PREMATURE VENTRICULAR CONTRACTIONS, FREQUENT (ICD-427.69) PERIPHERAL VASCULAR DISEASE (ICD-443.9) THORACIC AORTIC ANEURYSM (ICD-441.2) HYPERCHOLESTEROLEMIA (ICD-272.0) DIVERTICULOSIS OF COLON (ICD-562.10) COLONIC POLYPS (ICD-211.3) PROSTATE CANCER (ICD-185) DEGENERATIVE JOINT DISEASE (ICD-715.90) ACTINIC SKIN DAMAGE (ICD-692.70)  Past Surgical History: Last updated: 11/16/2009 S/P bilat TKR's S/P Abdominal Aortic Aneurysm Repair w/ Ao Bi-iliac graft in 1998 S/P appendectomy & left inguinal hernia repair  Family History: Last updated: 04-12-09 mother died at age 43 w/ senile dementia father died at age 68 from a stroke 6 siblings: 2 Bothers died- one age 99 w/ DM, vasc dis, heart dis; one age 63 w/ obesity & heart problems... 4 Sisters died- alzheimer's, heart dis, cancer ("in her back" ?myeloma)...  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   Vital Signs:  Patient profile:   77  year old male Height:      69 inches Weight:      195 pounds Pulse rate:   74 / minute Pulse rhythm:   regular Resp:     14 per minute BP sitting:   118 / 70  (left arm)  Vitals Entered By: Kem Parkinson (January 06, 2010 2:36 PM)  Physical Exam  General:  Affect appropriate Healthy:  appears stated age HEENT: normal Neck supple with no adenopathy JVP normal no bruits no thyromegaly Lungs clear with no wheezing and good diaphragmatic motion Heart:  S1/S2 no murmur,rub, gallop or click PMI normal Abdomen: benighn, BS positve, no tenderness, no AAA no bruit.  No HSM or HJR Distal pulses intact with no bruits No edema Neuro non-focal Skin warm and dry    Impression & Recommendations:  Problem # 1:  ATRIAL FIBRILLATION (ICD-427.31) Succesful DCC on 4/1 with resolution of LAA clot.  Continue coumadin  F/U 8 weeks His updated medication list for this problem includes:    Warfarin Sodium 2.5 Mg Tabs (Warfarin sodium) .[name]... Take as directed    Adult Aspirin Ec Low Strength 81 Mg Tbec (Aspirin) ..... Once daily    Atenolol 50 Mg Tabs (Atenolol) .[name]... Take 1 tab by mouth once daily...  Problem # 2:  COPD (ICD-496) F/U [place] and continue inhalers.  Add claritin or zyrtec during spring season His updated medication list for this problem includes:    Advair Diskus 250-50 Mcg/dose Aepb (Fluticasone-salmeterol) .[name]... 1 inhalation two times a day...    Spiriva Handihaler 18 Mcg Caps (Tiotropium bromide monohydrate) ..... Inhale contents of one capsule by handihaler daily  Problem # 3:  HYPERTENSION (ICD-401.9) Well contorlled His updated medication list for this problem includes:    Adult Aspirin Ec Low Strength 81 Mg Tbec (Aspirin) ..... Once daily    Atenolol 50 Mg Tabs (Atenolol) .[name]... Take 1 tab by mouth once daily...    Losartan Potassium 100 Mg Tabs (Losartan potassium) .[name]... Take 1 tab by mouth once daily...    Lasix 20 Mg Tabs (Furosemide) .[name]... Take 2 tabs by mouth each  am...  Problem # 4:  CONGESTIVE HEART FAILURE (ICD-428.0) Appears euvolemic on current dose of Lasix.  Consdier BNP 8 weeks post [facility].  Continue BB and ARB His updated medication list for this problem includes:    Warfarin Sodium 2.5 Mg Tabs (Warfarin sodium) .[name]... Take as directed    Adult Aspirin Ec Low Strength 81 Mg Tbec (Aspirin) ..... Once daily    Atenolol 50 Mg Tabs (Atenolol) .[name]... Take 1 tab by mouth once daily...    Losartan Potassium 100 Mg Tabs (Losartan potassium) .[name]... Take 1 tab by mouth once daily...    Lasix 20 Mg Tabs (Furosemide) .[name]... Take 2 tabs by mouth each am...  Problem # 5:  COUMADIN THERAPY (ICD-V58.61) Given age, moderate LAE, previous LAA clot may keep on long term.  F/U coumadin clinic 4 weeks  Patient Instructions: 1)  Your physician recommends that you schedule a follow-up appointment in: 8 WEEKS   Echocardiogram Report  Procedure date:  01/06/2010  Findings:      NSR low atrial focus Occasional PVC Inferolateral T wave changes QT 414/437

## 2010-10-25 NOTE — Letter (Signed)
Summary: [facility]   Imported By: Sherian Rein 02/24/2010 14:45:45  _____________________________________________________________________  External Attachment:    Type:   Image     Comment:   External Document

## 2010-10-25 NOTE — Letter (Signed)
Summary: [facility]  [facility]   Imported By: Bubba Hales 02/15/2009 07:34:04  _____________________________________________________________________  External Attachment:    Type:   Image     Comment:   External Document

## 2010-10-25 NOTE — Medication Information (Signed)
Summary: rov-tp  Anticoagulant Therapy  Managed by: Elaina Pattee, PharmD Referring MD: [name] MD, Lavera Guise MD: [name] MD, Theron Arista Indication 1: Atrial Fibrillation Lab Used: [facility] [place] Site: [address] INR RANGE 2.0-3.0  Dietary changes: no    Health status changes: no    Bleeding/hemorrhagic complications: no    Recent/future hospitalizations: no    Any changes in medication regimen? no    Recent/future dental: no  Any missed doses?: no       Is patient compliant with meds? yes       Allergies: No Known Drug Allergies  Anticoagulation Management History:      The patient is taking warfarin and comes in today for a routine follow up visit.  Positive risk factors for bleeding include an age of 75 years or older.  The bleeding index is 'intermediate risk'.  Positive CHADS2 values include History of CHF, History of HTN, and Age > 49 years old.  His last INR was 7.1 ratio.  Anticoagulation responsible provider: [name] MD, Theron Arista.  Cuvette Lot#: [id number].  Exp: 04/2011.    Anticoagulation Management Assessment/Plan:      The patient's current anticoagulation dose is Warfarin sodium 2.5 mg tabs: take as directed.  The target INR is 2.0-3.0.  The next INR is due 03/17/2010.  Anticoagulation instructions were given to patient.  Results were reviewed/authorized by Elaina Pattee, PharmD.  He was notified by Elaina Pattee, PharmD.         Prior Anticoagulation Instructions: The patient is to continue with the same dose of coumadin.  This dosage includes: 5mg  daily except 2.5mg  Mon & Fri.  Current Anticoagulation Instructions: INR 3.3. Take 1/2 tablet on Tues, Thurs, Sat and 1 tablet on other days.  Recheck in 2-3 weeks.

## 2010-10-25 NOTE — Medication Information (Signed)
Summary: [facility]  Anticoagulant Therapy  Managed by: Inactive Referring MD: [name] MD, Lavera Guise MD: [name] MD, Theron Arista Indication 1: Atrial Fibrillation Lab Used: [facility] [place] Site: [address] INR RANGE 2.0-3.0          Comments: Coumadin on hold per Dr. Kriste Basque due to pt coughing up blood.  Pending lung biopsy.  Dr. Jodelle Green office to call when/if Coumadin restarted.   Allergies: No Known Drug Allergies  Anticoagulation Management History:      Positive risk factors for bleeding include an age of 75 years or older.  The bleeding index is 'intermediate risk'.  Positive CHADS2 values include History of CHF, History of HTN, and Age > 19 years old.  His last INR was 5.8 ratio.  Anticoagulation responsible provider: [name] MD, Theron Arista.  Exp: 04/2011.    Anticoagulation Management Assessment/Plan:      The patient's current anticoagulation dose is Warfarin sodium 2.5 mg tabs: *** HOLD ***.  The target INR is 2.0-3.0.  The next INR is due 03/17/2010.  Anticoagulation instructions were given to patient.  Results were reviewed/authorized by Inactive.         Prior Anticoagulation Instructions: INR 3.3. Take 1/2 tablet on Tues, Thurs, Sat and 1 tablet on other days.  Recheck in 2-3 weeks.

## 2010-10-25 NOTE — Letter (Signed)
Summary: [facility]   Imported By: Edmonia James 01/11/2009 14:00:52  _____________________________________________________________________  External Attachment:    Type:   Image     Comment:   External Document

## 2010-10-25 NOTE — Assessment & Plan Note (Signed)
Summary: 6 month/apc   CC:  6 month ROV 7 review of mult medical problems....  History of Present Illness: 77 y/o WM here for a follow up visit... he has multiple medical problems as noted below...     ~  March 15, 2009:  58mofollow up doing well- no new complaints or concerns... he saw DrGearhart 4/10- f/u thoracoabdominal aneurysm (s/p AAA &right common iliac aneurysm repair 1998 by DSheryn Bison... 164moT scan showed  ~no change in the 5cm aneurysm & he rec keep BP under control & f/u in another 1885mo he also had f/u DrWrenn for Urology- f/u Prostate Cancer s/p XRT finished 5/09... doing well w/o symptoms and PSA was down to 0.30- plans f/u another 31mo52mofinally saw DrNiCherly Hensen 50yr 65yriac f/u doing well- no change in meds and yearly ROV is planned...   ~  September 13, 2009:  he's had a good 31mo- 38mo c/o some right hip pain... known severe DJD w/ bilat TKR's DrOlin & pt will continue Tylenol/ OTC meds and f/u w/ Ortho... BP controlled on the Aten/ Norvasc/ Atacand but would like cheaper subst to the ARB- change to LOSARTAN... Chol well controlled on diet + Lip10... OK Flu shot today...    Current Problem List:  COPD (ICD-[id number] is an ex-smoker, having quit in 1990 after 40 yrs of smoking... he has been exercising regularly and walking daily  ~1mi 5-98mays per week...  ~  baseline CXR & CTChest w/ marked emphysema, atheromatous calcif, 5cm desc thorAA...   ~  6/10: denies cough, sputum, hemoptysis, worsening dyspnea, wheezing, chest pains, snoring, daytime hypersomnolence, etc...   HYPERTENSION (ICD-401.9) - on ATENOLOL '50mg'$ /d, NORVASC '10mg'$ /d, & ATACAND '8mg'$ /d... BP= 142/84 and feeling well... home BP checks are "all good" w/ high= 140... hx ACE cough in the past on Lisinopril... denies HA, fatigue, visual changes, CP, palipit, syncope, dyspnea, edema, etc...  ~  NuclearStressTest 1/05 was neg- no ischemia or infarct (+diaphrag attenuation), EF=56%...  ~  repeat Nuclear study 6/09 was neg-  no ischemia, mild inferoapic thinning, not gated due to PVCs...  ~  2DEcho 6/09 showed mild dilated LV w/ EF= 45-50% but no regional wall motion abn, mild AoV calcif & AI, LA mild dil...  ~  12/10:  changed from Atacand to LOSARTAN to save $$  PREMATURE VENTRICULAR CONTRACTIONS, FREQUENT (ICD-427.69) - followed by DrNishaCherly Hensenable on above meds... he is asymptomatic w/o CP, palpit, dizzy, etc...  PERIPHERAL VASCULAR DISEASE (ICD-443.9) - on ASA '81mg'$ /d... he is s/p infrarenal AAA repair w/ right common iliac aneurysm repair (via Ao Bi-iliac graft) in 1998 by DrLawson... also has a known desc thor AA measuring  ~5cm and followed by DrGearhardt every 18 months...   ~  seen by DrGearhart 4/10 w/ CT scan showing  ~5cm thoracoabdominal aneurysm w/o signif change... f/u planned in another 53mo...331moERCHOLESTEROLEMIA (ICD-272.0) - on LIPITOR '10mg'$ /d...   ~  [place] TChol 115, Tg 58, HDL 36, LDL 67  ~  FLP 5/08 showed TChol 118, TG 66, HDL 31, LDL 74  ~  FLP 5/09 showed TChol 126, TG 71, HDL 37, LDL 75  ~  FLP 6/10 showed TChol 116, TG 73, HDL 40, LDL 62  DIVERTICULOSIS OF COLON (ICD-562.10) & COLONIC POLYPS (ICD-211.3) - hx polyps in 2003 = tubular adenoma...  ~  last colonoscopy 9/06 showed divertics, hems, no polyps.  PROSTATE CANCER (ICD-185) - eval by DrWrenn and they decided on XRT by DrKinard- finished  5/09 and he states that DrKinard "released me"... saw DrWrenn 11/09 and was doing well- f/u planned Q71mo..  ~  5/10: f/u by DrWrenn doing well... PSA= 0.30  ~  11/10:  f/u DrWrenn w/ PSA= 0.04  DEGENERATIVE JOINT DISEASE (ICD-715.90) - s/p bilat TKR's, [facility]- DrOlin...  ACTINIC SKIN DAMAGE (ICD-692.70) - followed by DBrunetta Jeans he knows to avoid sun exposure, use sun screen, etc...    Allergies (verified): No Known Drug Allergies  Comments:  Nurse/Medical Assistant: The patient's medications and allergies were reviewed with the patient and were updated in the  Medication and Allergy Lists.  Past History:  Past Medical History: COPD (ICD-496) HYPERTENSION (ICD-401.9) PREMATURE VENTRICULAR CONTRACTIONS, FREQUENT (ICD-427.69) PERIPHERAL VASCULAR DISEASE (ICD-443.9) THORACIC AORTIC ANEURYSM (ICD-441.2) HYPERCHOLESTEROLEMIA (ICD-272.0) DIVERTICULOSIS OF COLON (ICD-562.10) COLONIC POLYPS (ICD-211.3) PROSTATE CANCER (ICD-185) DEGENERATIVE JOINT DISEASE (ICD-715.90) ACTINIC SKIN DAMAGE (ICD-692.70)  Past Surgical History: S/P bilat TKR's...  S/P Abdominal Aortic Aneurysm Repair w/ Ao Bi-iliac graft in 1998 S/P appendectomy & left inguinal hernia repair  Family History: Reviewed history from 03/15/2009 and no changes required. mother died at age 5457w/ senile dementia father died at age 4664from a stroke 6 siblings: 2 Bothers died- one age 536w/ DM, vasc dis, heart dis; one age 4679w/ obesity & heart problems... 4 Sisters died- alzheimer's, heart dis, cancer ("in her back" ?myeloma)...  Social History: Reviewed history from 03/15/2009 and no changes required. Married to [employer]3 children Ex-smoker- quit 1990 Social alcohol Retired sAdministrator, Civil Service Review of Systems      See HPI       The patient complains of dyspnea on exertion.  The patient denies anorexia, fever, weight loss, weight gain, vision loss, decreased hearing, hoarseness, chest pain, syncope, peripheral edema, prolonged cough, headaches, hemoptysis, abdominal pain, melena, hematochezia, severe indigestion/heartburn, hematuria, incontinence, muscle weakness, suspicious skin lesions, transient blindness, difficulty walking, depression, unusual weight change, abnormal bleeding, enlarged lymph nodes, and angioedema.    Vital Signs:  Patient profile:   77year old male Height:      69 inches Weight:      218 pounds O2 Sat:      96 % on Room air Temp:     97.0 degrees F oral Pulse rate:   77 / minute BP sitting:   142 / 84  (left arm) Cuff size:   regular  Vitals Entered  By: LElita BooneCMA (September 13, 2009 11:16 AM)  O2 Sat at Rest %:  96 O2 Flow:  Room air CC: 6 month ROV 7 review of mult medical problems... Comments NO CHANGES IN MEDS   Physical Exam  Additional Exam:  WD, WN, 77y/o WM in NAD... GENERAL:  Alert & oriented; pleasant & cooperative... HEENT:  [place]/AT, EOM-wnl, PERRLA, EACs-clear, TMs-wnl, NOSE-clear, THROAT-clear & wnl. NECK:  Supple w/ fairROM; no JVD; normal carotid impulses w/o bruits; no thyromegaly or nodules palpated; no lymphadenopathy. CHEST:  decr BS bilat, clear to P & A; without wheezes/ rales/ or rhonchi. HEART:  regular rhythm, gr1/6 SEM without rubs or gallops heard... ABDOMEN:  Soft & nontender; normal bowel sounds; no organomegaly or masses detected. EXT: s/p bilat TKR's, mod arthritic changes; no varicose veins/ +venous insuffic/ no edema. NEURO:  CN's intact;  gait abn; no focal neuro deficits... DERM:  No lesions noted; no rash etc...     Impression & Recommendations:  Problem # 1:  COPD (ICD-496) Stable-  continue exercise program...  Problem # 2:  HYPERTENSION (ICD-401.9)  Controlled-  we will change the Atacand to LOSARTAN100... His updated medication list for this problem includes:    Atenolol 50 Mg Tabs (Atenolol) .[name]... Take 1 tab by mouth once daily...    Norvasc 10 Mg Tabs (Amlodipine besylate) .[name]... Take 1/2 tab by mouth once daily...    Losartan Potassium 100 Mg Tabs (Losartan potassium) .[name]... Take 1 tab by mouth once daily...  Problem # 3:  PERIPHERAL VASCULAR DISEASE (ICD-443.9) Followed by DrGearhart... notes reviewed.  Problem # 4:  HYPERCHOLESTEROLEMIA (ICD-272.0) Stable on diet + Lip10... His updated medication list for this problem includes:    Lipitor 10 Mg Tabs (Atorvastatin calcium) .[name]... Take 1 tab by mouth once daily...  Problem # 5:  PROSTATE CANCER (ICD-185) Followed by DrWrenn & stable...  Problem # 6:  DEGENERATIVE JOINT DISEASE (ICD-715.90) He will f/u w/ drOlin Prn... His  updated medication list for this problem includes:    Adult Aspirin Ec Low Strength 81 Mg Tbec (Aspirin) ..... Once daily  Problem # 7:  OTHER MEDICAL PROBLEMS AS LISTED>>> OK flu shot...  Complete Medication List: 1)  Adult Aspirin Ec Low Strength 81 Mg Tbec (Aspirin) .... Once daily 2)  Atenolol 50 Mg Tabs (Atenolol) .... Take 1 tab by mouth once daily.Marland KitchenMarland Kitchen 3)  Norvasc 10 Mg Tabs (Amlodipine besylate) .... Take 1/2 tab by mouth once daily.Marland KitchenMarland Kitchen 4)  Losartan Potassium 100 Mg Tabs (Losartan potassium) .... Take 1 tab by mouth once daily.Marland KitchenMarland Kitchen 5)  Lipitor 10 Mg Tabs (Atorvastatin calcium) .... Take 1 tab by mouth once daily.Marland KitchenMarland Kitchen 6)  Multivitamins Tabs (Multiple vitamin) .... Take 1 tablet by mouth once a day  Other Orders: Admin 1st Vaccine [id number]) Flu Vaccine 7yr + ([id number]  Patient Instructions: 1)  Today we updated your med list- see below.... 2)  We decided to change the Atacand to generic LOSARTAN to save $$$... watch your BP at home and call for any problems..Marland KitchenMarland Kitchen3)  We gave you the 2010 Flu vaccine today... 4)  Call for any problems..Marland KitchenMarland Kitchen5)  Please schedule a follow-up appointment in 6 months, and we will plan FASTING blood work at that time... Prescriptions: LOSARTAN POTASSIUM 100 MG TABS (LOSARTAN POTASSIUM) take 1 tab by mouth once daily...  #90 x 4   Entered and Authorized by:   SNoralee SpaceMD   Signed by:   SNoralee SpaceMD on 09/13/2009   Method used:   Print then Give to Patient   RxID:[id number] Flu Vaccine Consent Questions     Do you have a history of severe allergic reactions to this vaccine? no    Any prior history of allergic reactions to egg and/or gelatin? no    Do you have a sensitivity to the preservative Thimersol? no    Do you have a past history of Guillan-Barre Syndrome? no    Do you currently have an acute febrile illness? no    Have you ever had a severe reaction to latex? no    Vaccine information given and explained to patient? yes    Are you  currently pregnant? no    Lot Number:[id number]   Exp Date:03/24/2010   Site Given  Left Deltoid IMlbflu   LElita BooneCMA  September 13, 2009 12:09 PM

## 2010-10-25 NOTE — Letter (Signed)
Summary: PSA fallen0.77;voiding wo complaints; prostate ca/[facility]  PSA fallen0.77;voiding wo complaints; prostate ca/[facility]   Imported By: Bubba Hales 08/18/2008 13:54:01  _____________________________________________________________________  External Attachment:    Type:   Image     Comment:   External Document

## 2010-10-25 NOTE — Medication Information (Signed)
Summary: rov/jk  Anticoagulant Therapy  Managed by: Cloyde Reams, RN, BSN Referring MD: [name] MD, Lavera Guise MD: Myrtis Ser MD, Tinnie Gens Indication 1: Atrial Fibrillation Lab Used: [facility] [place] Site: [address] INR POC 2.8 INR RANGE 2.0-3.0  Dietary changes: no    Health status changes: no    Bleeding/hemorrhagic complications: no    Recent/future hospitalizations: no    Any changes in medication regimen? no    Recent/future dental: no  Any missed doses?: no       Is patient compliant with meds? yes       Allergies: No Known Drug Allergies  Anticoagulation Management History:      The patient is taking warfarin and comes in today for a routine follow up visit.  Positive risk factors for bleeding include an age of 75 years or older.  The bleeding index is 'intermediate risk'.  Positive CHADS2 values include History of CHF, History of HTN, and Age > 65 years old.  His last INR was 1.4 ratio.  Anticoagulation responsible provider: Myrtis Ser MD, Tinnie Gens.  INR POC: 2.8.  Cuvette Lot#: [id number].  Exp: 06/2011.    Anticoagulation Management Assessment/Plan:      The patient's current anticoagulation dose is Warfarin sodium 2.5 mg tabs: as directed  by the coumadin clinic.  The target INR is 2.0-3.0.  The next INR is due 06/13/2010.  Anticoagulation instructions were given to patient.  Results were reviewed/authorized by Cloyde Reams, RN, BSN.  He was notified by Cloyde Reams RN.         Prior Anticoagulation Instructions: INR 3.2  Take 1/2 tablet (2.5mg ) every day except take 1 tablet (5mg ) on Fridays.  Recheck in 2 weeks.     Current Anticoagulation Instructions: INR 2.8  Continue on same dosage 1/2 tablet daily except 1 tablet on Fridays. Recheck in 3 weeks.

## 2010-10-25 NOTE — Letter (Signed)
Summary: Ryan Hess   Imported By: Jerrye Noble D'jimraou 12/30/2007 14:06:03  _____________________________________________________________________  External Attachment:    Type:   Image     Comment:   External Document

## 2010-10-25 NOTE — Assessment & Plan Note (Signed)
Summary: 6 month follow up/lw   CC:  6 month ROV & review of mult medical problems....  History of Present Illness: 77 y/o WM here for a follow up visit... he has multiple medical problems as noted below...     ~  March 15, 2009:  8mofollow up doing well- no new complaints or concerns... he saw DrGearhart 4/10- f/u thoracoabdominal aneurysm (s/p AAA &right common iliac aneurysm repair 1998 by DSheryn Bison... 199moT scan showed  ~no change in the 5cm aneurysm & he rec keep BP under control & f/u in another 1849mo he also had f/u DrWrenn for Urology- f/u Prostate Cancer s/p XRT finished 5/09... doing well w/o symptoms and PSA was down to 0.30- plans f/u another 41mo27mofinally saw DrNiCherly Hensen 89yr 2106yriac f/u doing well- no change in meds and yearly ROV is planned...    Current Problem List:  COPD (ICD-[id number] is an ex-smoker, having quit in 1990 after 40 yrs of smoking... he has been exercising regularly and walking daily  ~1mi 525mdays per week...  ~  baseline CXR & CTChest w/ marked emphysema, atheromatous calcif, 5cm desc thorAA...   ~  6/10: denies cough, sputum, hemoptysis, worsening dyspnea,  wheezing, chest pains, snoring, daytime hypersomnolence, etc...   HYPERTENSION (ICD-401.9) - on ATENOLOL '50mg'$ /d, NORVASC '10mg'$ /d, & ATACAND '8mg'$ /d... BP= 136/80 and feeling well... home BP checks are "all good" w/ high= 140... hx ACE cough in the past on Lisinopril... denies HA, fatigue, visual changes, CP, palipit, syncope, dyspnea, edema, etc...  ~  NuclearStressTest 1/05 was neg- no ischemia or infarct (+diaphrag attenuation), EF=56%...  ~  repeat Nuclear study 6/09 was neg- no ischemia, mild inferoapic thinning, not gated due to PVCs...  ~  2DEcho 6/09 showed mild dilated LV w/ EF= 45-50% but no regional wall motion abn, mild AoV calcif & AI, LA mild dil...  PREMATURE VENTRICULAR CONTRACTIONS, FREQUENT (ICD-427.69) - followed by DrNishCherly Hensentable on above meds... he is asymptomatic w/o CP,  palpit, dizzy, etc...  PERIPHERAL VASCULAR DISEASE (ICD-443.9) - on ASA '81mg'$ /d... he is s/p infrarenal AAA repair w/ right common iliac aneurysm repair (via Ao Bi-iliac graft) in 1998 by DrLawson... also has a known desc thor AA measuring  ~5cm and followed by DrGearhardt every 18 months...   ~  seen by DrGearhart 4/10 w/ CT scan showing  ~5cm thoracoabdominal aneurysm w/o signif change... f/u planned in another 18mo..441moPERCHOLESTEROLEMIA (ICD-272.0) - on LIPITOR '10mg'$ /d...   ~  FLP 5/0[place] TChol 115, Tg 58, HDL 36, LDL 67...  ~  [place] T[place]ol 118, TG 66, HDL 31, LDL 74...  ~  FLP 5/0[place] TChol 126, TG 71, HDL 37, LDL 75...  ~  FLP 6/10 showed TChol =   DIVERTICULOSIS OF COLON (ICD-562.10) & COLONIC POLYPS (ICD-211.3) - hx polyps in 2003 = tubular adenoma...  ~  last colonoscopy 9/06 showed divertics, hems, no polyps.  PROSTATE CANCER (ICD-185) - eval by DrWrenn and they decided on XRT by DrKinard- finished 5/09 and he states that DrKinard "released me"... saw DrWrenn 11/09 and was doing well- f/u planned Q41mo... 19mo5/10: f/u by DrWrenn doing well... PSA= 0.30  DEGENERATIVE JOINT DISEASE (ICD-715.90) - s/p bilat TKR's...  ACTINIC SKIN DAMAGE (ICD-692.70) - followed by DrHoustoBrunetta Jeansws to avoid sun exposure, use sun screen, etc...   Allergies (verified): No Known Drug Allergies  Comments:  Nurse/Medical Assistant: The patient's medications and allergies were reviewed with the patient and  were updated in the Medication and Allergy Lists.  Past History:  Past Medical History: COPD (ICD-496) HYPERTENSION (ICD-401.9) PREMATURE VENTRICULAR CONTRACTIONS, FREQUENT (ICD-427.69) PERIPHERAL VASCULAR DISEASE (ICD-443.9) THORACIC AORTIC ANEURYSM (ICD-441.2) HYPERCHOLESTEROLEMIA (ICD-272.0) DIVERTICULOSIS OF COLON (ICD-562.10) COLONIC POLYPS (ICD-211.3) PROSTATE CANCER (ICD-185) DEGENERATIVE JOINT DISEASE (ICD-715.90) ACTINIC SKIN DAMAGE (ICD-692.70)  Past  Surgical History: S/P bilat TKR's...  S/P Abdominal Aortic Aneurysm Repair w/ Ao Bi-iliac graft in 1998 S/P appendectomy & left inguinal hernia repair  Family History: mother died at age 57 w/ senile dementia father died at age 65 from a stroke 6 siblings: 2 Bothers died- one age 71 w/ DM, vasc dis, heart dis; one age 61 w/ obesity & heart problems... 4 Sisters died- alzheimer's, heart dis, cancer ("in her back" ?myeloma)...  Social History: Married to [employer] 3 children Ex-smoker- quit 1990 Social alcohol Retired Administrator, Civil Service  Review of Systems      See HPI  The patient denies anorexia, fever, weight loss, weight gain, vision loss, decreased hearing, hoarseness, chest pain, syncope, dyspnea on exertion, peripheral edema, prolonged cough, headaches, hemoptysis, abdominal pain, melena, hematochezia, severe indigestion/heartburn, hematuria, incontinence, muscle weakness, suspicious skin lesions, transient blindness, difficulty walking, depression, unusual weight change, abnormal bleeding, enlarged lymph nodes, and angioedema.    Vital Signs:  Patient profile:   77 year old male Height:      69 inches Weight:      214.25 pounds O2 Sat:      94 % on Room air Temp:     97.6 degrees F oral Pulse rate:   57 / minute BP sitting:   136 / 80  (right arm) Cuff size:   regular  Vitals Entered By: Ramiro Harvest CMA (March 15, 2009 2:07 PM)  O2 Sat at Rest %:  94 O2 Flow:  Room air CC: 6 month ROV & review of mult medical problems... Is Patient Diabetic? No Pain Assessment Patient in pain? no      Comments no changes in meds   Physical Exam  Additional Exam:  WD, WN, 77 y/o WM in NAD... GENERAL:  Alert & oriented; pleasant & cooperative... HEENT:  /AT, EOM-wnl, PERRLA, EACs-clear, TMs-wnl, NOSE-clear, THROAT-clear & wnl. NECK:  Supple w/ fairROM; no JVD; normal carotid impulses w/o bruits; no thyromegaly or nodules palpated; no lymphadenopathy. CHEST:  decr BS bilat,  clear to P & A; without wheezes/ rales/ or rhonchi. HEART:  regular rhythm, gr1/6 SEM without rubs or gallops heard... ABDOMEN:  Soft & nontender; normal bowel sounds; no organomegaly or masses detected. EXT: s/p bilat TKR's, mod arthritic changes; no varicose veins/ +venous insuffic/ no edema. NEURO:  CN's intact;  gait abn; no focal neuro deficits... DERM:  No lesions noted; no rash etc...     Impression & Recommendations:  Problem # 1:  COPD (ICD-496) Stable-  he walks 1 mi daily and not SOB...  Problem # 2:  HYPERTENSION (ICD-401.9) Controlled on Rx... he monitors at home.[name] His updated medication list for this problem includes:    Atenolol 50 Mg Tabs (Atenolol) .[name]... Take 1 tab by mouth once daily...    Norvasc 10 Mg Tabs (Amlodipine besylate) .[name]... Take 1/2 tab by mouth once daily...    Atacand 8 Mg Tabs (Candesartan cilexetil) .[name]... Take 1 tab by mouth once daily  Orders: Venipuncture [id number]) TLB-Lipid Panel (80061-LIPID) TLB-BMP (Basic Metabolic Panel-BMET) (99991111) TLB-CBC Platelet - w/Differential (85025-CBCD) TLB-Hepatic/Liver Function Pnl (80076-HEPATIC) TLB-TSH (Thyroid Stimulating Hormone) (84443-TSH)  Problem # 3:  THORACIC AORTIC ANEURYSM (ICD-441.2) Followed  by DrGearhart as noted-  stable CT Angio...  Problem # 4:  HYPERCHOLESTEROLEMIA (ICD-272.0) Due for f/u FLP... His updated medication list for this problem includes:    Lipitor 10 Mg Tabs (Atorvastatin calcium) .[name]... Take 1 tab by mouth once daily...  Problem # 5:  DIVERTICULOSIS OF COLON (ICD-562.10) GI is stable...  Problem # 6:  PROSTATE CANCER (ICD-185) GU is stable and followed by Urology...  Problem # 7:  DEGENERATIVE JOINT DISEASE (ICD-715.90) He gets along very well onhis bionic knees! His updated medication list for this problem includes:    Adult Aspirin Ec Low Strength 81 Mg Tbec (Aspirin) ..... Once daily  Complete Medication List: 1)  Adult Aspirin Ec Low Strength 81 Mg Tbec  (Aspirin) .... Once daily 2)  Atenolol 50 Mg Tabs (Atenolol) .... Take 1 tab by mouth once daily.Marland KitchenMarland Kitchen 3)  Norvasc 10 Mg Tabs (Amlodipine besylate) .... Take 1/2 tab by mouth once daily.Marland KitchenMarland Kitchen 4)  Atacand 8 Mg Tabs (Candesartan cilexetil) .... Take 1 tab by mouth once daily 5)  Lipitor 10 Mg Tabs (Atorvastatin calcium) .... Take 1 tab by mouth once daily.Marland KitchenMarland Kitchen 6)  Multivitamins Tabs (Multiple vitamin) .... Take 1 tablet by mouth once a day  Patient Instructions: 1)  Today we updated your med list- see below.... 2)  Continue your current meds the same... 3)  Keep up the great work w/ your exercise program... 4)  Today we did your follow up FASTING blood work... please call the "phone tree" in a few days for your lab results.Marland KitchenMarland Kitchen 5)  Call for any problems.Marland KitchenMarland Kitchen 6)  Please schedule a follow-up appointment in 6 months.

## 2010-10-25 NOTE — Assessment & Plan Note (Signed)
Summary: 6-8 week follow up--cxr prior to ov/la   CC:  7 week ROV w/ f/u CXR....  History of Present Illness: 77 y/o WM here for a follow up visit... he has multiple medical problems as noted below...     ~  Jun10:  70mo follow up doing well- no new complaints or concerns... he saw DrGearhart 4/10- f/u thoracoabdominal aneurysm (s/p AAA &right common iliac aneurysm repair 1998 by Windell Moulding)... 39mo CT scan showed  ~no change in the 5cm aneurysm & he rec keep BP under control & f/u in another 39mo... he also had f/u DrWrenn for Urology- f/u Prostate Cancer s/p XRT finished 5/09... doing well w/o symptoms and PSA was down to 0.30- plans f/u another 70mo... finally saw Walker Kehr for 68yr cardiac f/u doing well- no change in meds and yearly ROV is planned...   ~  November 16, 2009:  Hosp 2/4 - 11/05/09 for CP- felt to be noncardiac in origin w/ cath showing nonobstructive CAD but he had new AFib, CHF w/ EF ~45% w/ HK & TEE showing left atrial appendage clot... he was diuresed & meds adjusted, placed on Coumadin w/ careful f/u by Walker Kehr & the CC... he had PV f/u w/ CT to check his thoracoabd aneurysm= 5.6cm (min larger serially) & f/u planned 70mo...  from the pulm standpoint he had a bronchitic exac treated w/ Doxy, and reminded to take the Advair/ Spiriva regularly...  currently feeling better w/ Lasix/ KCl added & using inhalers regularly...   ~  March 15, 2010:  he was hosp 4/11 by [facility] w/ RLL Pneum- NOS, treated empirically w/ Vanc/ Zosyn, then Po Avelox & outpt follow up... RLL has not cleared & he developed some hemoptysis (on Coumadin for AFib) & CT Chest 03/11/10 showed emphysema w/ 6cm cavitary lesion RLL & a nodular component inferiorly, +right hilar adenop, no mediastinal adenop, ThorAA measures 5.8cm, etc...  he is an ex-smoker quit 1990 w/ 75yrs up to 2ppd prior... states he feels pretty good- no CP, mild cough, beige phlegm, no blood off the Coumadin x several days now... **we discussed f/u labs  (OK-reviewed), check tumor markers CEA=7.5, Ca19-9=11), contact IR re: needle bx of this lesion;  in the interim> Rx w/ Augmentin 875mg Bid.   **NOTE: IR, DrSchick, felt bx was hi risk w/ his emphysema (prob get pneumothorax, chest tube, etc) & rec holding off on bx for now.   ~  May 04, 2010:  continues to feel well- "I'm doing good"... no CP, no SOB, min cough w/ yellow sput (no change, no blood), walking for exercise... f/u CXR looks similar w/ no change in RLL opacity & we discussed f/u CEA level today and ROV 32mo w/ CT Chest at that time.   Current Problem List:  COPD (ICD-496) PNEUMONIA, ORGANISM UNSPECIFIED (ICD-486) PULMONARY NODULE (ICD-518.89) - he is an ex-smoker, having quit in 1990 after 40 yrs of smoking... he has been exercising regularly and walking daily  ~45mi 5-6 days per week... on ADVAIR 250Bid & SPIRIVA daily, +MUCINEX 2Bid w/ Fluids...  ~  baseline CXR & CTChest w/ marked emphysema, atheromatous calcif, 5cm desc thorAA...   ~  CXR & CTA 2/11 in hosp showed cardiomeg + coronary calcif, advanced atherosclerosis of Ao w/ aneuryms 5.8cm into abd, underlying emphysema w/ interst edema & bilat effusions, no PE...  ~  4/11:  RLL pneumonia which was slow to clear;  ~  6/11:  f/u CXR & CT Chest 6/11 w/ 6cm cavitary area RLL &  nodular component inferiorly, +hilar adenopathy, no mediastinal nodes, etc; Tumor markers showed CEA=7.5 & Ca19-9= 10.8; > IR felt lesion was too hi risk for bx, therefore contin Rx & observe.  ~  8/11:  f/u CXR w/o change in RLL opac (no worsening)>> repeat CEA=    ; plan f/u CT Chest in 72mo.  HYPERTENSION (ICD-401.9)                               **followed by Walker Kehr for Cards... CONGESTIVE HEART FAILURE (ICD-428.0) ATRIAL FIBRILLATION (ICD-427.31) w/ clot in left atrial appendage >> resolved on f/u TEE, on COUMADIN per Cards. PREMATURE VENTRICULAR CONTRACTIONS, FREQUENT (ICD-427.69) - on ATENOLOL 50mg /d, LOSARTAN 100mg /d, & LASIX 20mg -2AM + KCl 7mEq/d  (prev Norvasc discontinued)... BP= 116/64 and feeling better... home BP checks are "all good" w/ high= 140... hx ACE cough in the past on Lisinopril... denies HA, fatigue, visual changes, recurrentCP, palipit, syncope, dyspnea, edema, etc...  ~  NuclearStressTest 1/05 was neg- no ischemia or infarct (+diaphrag attenuation), EF=56%...  ~  repeat Nuclear study 6/09 was neg- no ischemia, mild inferoapic thinning, not gated due to PVCs...  ~  2DEcho 6/09 showed mild dilated LV w/ EF= 45-50% but no regional wall motion abn, mild AoV calcif & AI, LA mild dil...  ~  12/10:  changed from Atacand to LOSARTAN to save $$  ~  2/11:  in hosp- Norvasc stopped & Lasix started... on Coumadin for AFib.  PERIPHERAL VASCULAR DISEASE (ICD-443.9) - on ASA 81mg /d... he is s/p infrarenal AAA repair w/ right common iliac aneurysm repair (via Ao Bi-iliac graft) in 1998 by DrLawson... also has a known desc thor AA measuring  ~5+cm and followed by DrGearhardt every 18 months...   ~  seen by DrGearhart 4/10 w/ CT scan showing  ~5cm thoracoabdominal aneurysm w/o signif change...   ~  seen by DrGearhart 2/11 in hosp w/ sl incr size of AAA- f/u planned 107mo.  ~  also followed by [facility]- DrCooper.  HYPERCHOLESTEROLEMIA (ICD-272.0) - on LIPITOR 10mg /d...   ~  FLP 5/07 showed TChol 115, Tg 58, HDL 36, LDL 67  ~  FLP 5/08 showed TChol 118, TG 66, HDL 31, LDL 74  ~  FLP 5/09 showed TChol 126, TG 71, HDL 37, LDL 75  ~  FLP 6/10 showed TChol 116, TG 73, HDL 40, LDL 62  ~  2/11:  FLP not checked during the hospitalization...  ~  FLP 4/11 in hosp showed TChol 94, TG 52, HDL 23, LDL 61  DIVERTICULOSIS OF COLON (ICD-562.10) & COLONIC POLYPS (ICD-211.3) - hx polyps in 2003 = tubular adenoma...  ~  last colonoscopy 9/06 showed divertics, hems, no polyps.  PROSTATE CANCER (ICD-185) - eval by DrWrenn and they decided on XRT by DrKinard- finished 5/09 and he states that DrKinard "released me"... saw DrWrenn 11/09 and was doing well- f/u planned  Q89mo...  ~  5/10: f/u by DrWrenn doing well... PSA= 0.30  ~  11/10:  f/u DrWrenn w/ PSA= 0.04  DEGENERATIVE JOINT DISEASE (ICD-715.90) - s/p bilat TKR's, [facility]- DrOlin...  ACTINIC SKIN DAMAGE (ICD-692.70) - followed by Elnora Morrison, he knows to avoid sun exposure, use sun screen, etc...   Preventive Screening-Counseling & Management  Alcohol-Tobacco     Smoking Status: quit     Year Quit: 1990  Caffeine-Diet-Exercise     Does Patient Exercise: yes  Allergies (verified): No Known Drug Allergies  Comments:  Nurse/Medical  Assistant: The patient's medications and allergies were reviewed with the patient and were updated in the Medication and Allergy Lists.  Past History:  Past Medical History: COPD (ICD-496) PNEUMONIA, ORGANISM UNSPECIFIED (ICD-486) PULMONARY NODULE (ICD-518.89) HYPERTENSION (ICD-401.9) CONGESTIVE HEART FAILURE (ICD-428.0) ATRIAL FIBRILLATION (ICD-427.31) COUMADIN THERAPY (ICD-V58.61) PREMATURE VENTRICULAR CONTRACTIONS, FREQUENT (ICD-427.69) PERIPHERAL VASCULAR DISEASE (ICD-443.9) THORACIC AORTIC ANEURYSM (ICD-441.2) HYPERCHOLESTEROLEMIA (ICD-272.0) DIVERTICULOSIS OF COLON (ICD-562.10) COLONIC POLYPS (ICD-211.3) PROSTATE CANCER (ICD-185) DEGENERATIVE JOINT DISEASE (ICD-715.90) ACTINIC SKIN DAMAGE (ICD-692.70)  Past Surgical History: S/P bilat TKR's S/P Abdominal Aortic Aneurysm Repair w/ Ao Bi-iliac graft in 1998 S/P appendectomy & left inguinal hernia repair  Family History: Reviewed history from 03/15/2009 and no changes required. mother died at age 36 w/ senile dementia father died at age 22 from a stroke 6 siblings: 2 Bothers died- one age 22 w/ DM, vasc dis, heart dis; one age 44 w/ obesity & heart problems... 4 Sisters died- alzheimer's, heart dis, cancer ("in her back" ?myeloma)...  Social History: Reviewed history from 10/25/2009 and no changes required. 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  The patient denies anorexia, fever, weight loss, weight gain, vision loss, decreased hearing, hoarseness, chest pain, syncope, peripheral edema, prolonged cough, headaches, hemoptysis, abdominal pain, melena, hematochezia, severe indigestion/heartburn, hematuria, incontinence, muscle weakness, suspicious skin lesions, transient blindness, difficulty walking, depression, unusual weight change, abnormal bleeding, enlarged lymph nodes, and angioedema.    Vital Signs:  Patient profile:   77 year old male Height:      69 inches Weight:      191 pounds BMI:     28.31 O2 Sat:      95 % on Room air Temp:     98.5 degrees F oral Pulse rate:   53 / minute BP sitting:   116 / 64  (right arm) Cuff size:   regular  Vitals Entered By: Randell Loop CMA (May 04, 2010 2:55 PM)  O2 Sat at Rest %:  95 O2 Flow:  Room air CC: 7 week ROV w/ f/u CXR... Is Patient Diabetic? No Pain Assessment Patient in pain? no      Comments meds updated today with pt   Physical Exam  Additional Exam:  WD, WN, 77 y/o WM in NAD... GENERAL:  Alert & oriented; pleasant & cooperative... HEENT:  /AT, EOM-wnl, PERRLA, EACs-clear, TMs-wnl, NOSE-clear, THROAT-clear & wnl. NECK:  Supple w/ fairROM; no JVD; normal carotid impulses w/o bruits; no thyromegaly or nodules palpated; no lymphadenopathy. CHEST:  decr BS bilat, scat bibasilar rales, w/o wheezing/ rhonchi/ signs of consolidation. HEART:  irregular rhythm, gr1/6 diast murmur in Ao area, without rubs or gallops detected... ABDOMEN:  Soft & nontender; normal bowel sounds; no organomegaly or masses palpated... EXT: s/p bilat TKR's, mod arthritic changes; no varicose veins/ +venous insuffic/ no edema. NEURO:  CN's intact;  gait abn; no focal neuro deficits... DERM:  No lesions noted; no rash etc...    CXR  Procedure date:   05/04/2010  Findings:      CHEST - 2 VIEW Comparison: 03/11/2010   Findings: Stable patchy right mid and lower lung zone airspace opacities.  Lungs are hyperaerated.  Aorta remains markedly tortuous.  Heart is upper normal in size.  No pneumothorax. Irregular pleural thickening at the left apex is unchanged. Irregular linear densities  at the left base are stable.   IMPRESSION: Stable right lung airspace disease.   Read By:  Jolaine Click,  M.D.   Impression & Recommendations:  Problem # 1:  PULMONARY NODULE (ICD-518.89) He has COPD, Hx Pneumonia w/ unresolved XRay and CT showing emphysema, cavitary RLL lesion & a nodular component inferiorly... labs showed sl elev CEA at 7.5, otherw neg... IR declined Bx (felt to be high risk) therefore following this area conservatively>> we discussed f/u CEA to see if trending up, and plan f/u CT Chest in 2 mo... Orders: TLB-CEA (Carcinoembryonic Antigen) ([id number])  Problem # 2:  HYPERTENSION (ICD-401.9) BP controlled>  same meds. His updated medication list for this problem includes:    Atenolol 50 Mg Tabs (Atenolol) .[name]... Take 1 tab by mouth once daily...    Losartan Potassium 100 Mg Tabs (Losartan potassium) .[name]... Take 1 tab by mouth once daily...    Lasix 20 Mg Tabs (Furosemide) .[name]... Take 2 tabs by mouth each am...  Problem # 3:  ATRIAL FIBRILLATION (ICD-427.31) Continue meds and Coumadin per Cards... The following medications were removed from the medication list:    Adult Aspirin Ec Low Strength 81 Mg Tbec (Aspirin) ..... Once daily His updated medication list for this problem includes:    Warfarin Sodium 2.5 Mg Tabs (Warfarin sodium) .[name]... As directed  by the coumadin clinic    Atenolol 50 Mg Tabs (Atenolol) .[name]... Take 1 tab by mouth once daily...  Problem # 4:  THORACIC AORTIC ANEURYSM (ICD-441.2) Known ASPVD w/ ThorAA followed by DrCooper & DrGearhardt...  Problem # 5:  MULT MEDICAL PROBLEMS AS NOTED>>>  Complete Medication  List: 1)  Advair Diskus 250-50 Mcg/dose Aepb (Fluticasone-salmeterol) .[name].. 1 inhalation two times a day... 2)  Spiriva Handihaler 18 Mcg Caps (Tiotropium bromide monohydrate) .... Inhale contents of one capsule by handihaler daily 3)  Warfarin Sodium 2.5 Mg Tabs (Warfarin sodium) .... As directed  by the coumadin clinic 4)  Atenolol 50 Mg Tabs (Atenolol) .... Take 1 tab by mouth once daily.Marland KitchenMarland Kitchen 5)  Losartan Potassium 100 Mg Tabs (Losartan potassium) .... Take 1 tab by mouth once daily.Marland KitchenMarland Kitchen 6)  Lasix 20 Mg Tabs (Furosemide) .... Take 2 tabs by mouth each am... 7)  Klor-con 10 10 Meq Cr-tabs (Potassium chloride) .... Take 1 tablet by mouth once a day 8)  Lipitor 10 Mg Tabs (Atorvastatin calcium) .... Take 1 tab by mouth once daily.Marland KitchenMarland Kitchen 9)  Multivitamins Tabs (Multiple vitamin) .... Take 1 tablet by mouth once a day 10)  Mucinex 600 Mg Xr12h-tab (Guaifenesin) .... Take 2 tabs by mouth two times a day w/ plenty of fluids...  Patient Instructions: 1)  Today we updated your med list- see below.... 2)  Continue your current meds the same... 3)  Today we did a follow up on the CEA level (tumor marker) & we will call you w/ this result when avail.Marland KitchenMarland Kitchen 4)  Let's plan a follow up visit in about 2 months w/ a CT Chest scan (w/ contrast)  to be done 2-3d before that visit.Marland KitchenMarland Kitchen 5)  Let me know if you are having any new or recurrent problems in the interim.Marland KitchenMarland Kitchen

## 2010-10-25 NOTE — Medication Information (Signed)
Summary: rov/ewj  Anticoagulant Therapy  Managed by: Weston Brass, PharmD Referring MD: [name] MD, Lavera Guise MD: Tenny Craw MD, Gunnar Fusi Indication 1: Atrial Fibrillation Lab Used: [facility] [place] Site: [address] INR POC 2.8 INR RANGE 2.0-3.0  Dietary changes: no    Health status changes: no    Bleeding/hemorrhagic complications: no    Recent/future hospitalizations: no    Any changes in medication regimen? no    Recent/future dental: no  Any missed doses?: no       Is patient compliant with meds? yes       Allergies: No Known Drug Allergies  Anticoagulation Management History:      Positive risk factors for bleeding include an age of 15 years or older.  The bleeding index is 'intermediate risk'.  Positive CHADS2 values include History of CHF, History of HTN, and Age > 67 years old.  His last INR was 1.4 ratio.  Anticoagulation responsible provider: Tenny Craw MD, Gunnar Fusi.  INR POC: 2.8.  Cuvette Lot#: [id number].  Exp: 07/2011.    Anticoagulation Management Assessment/Plan:      The patient's current anticoagulation dose is Warfarin sodium 2.5 mg tabs: as directed  by the coumadin clinic.  The target INR is 2.0-3.0.  The next INR is due 07/04/2010.  Anticoagulation instructions were given to patient.  Results were reviewed/authorized by Weston Brass, PharmD.  He was notified by Kennieth Francois.         Prior Anticoagulation Instructions: INR 2.2 Continue 2.5mg s daily except 5mg s on Friday. Recheck on 05/13/10.   Current Anticoagulation Instructions: INR 2.8  Continue taking one-half tablet every day except for one tablet on Friday.  Return to clinic in three weeks.

## 2010-10-25 NOTE — Consult Note (Signed)
Summary: follow up note/[facility]  follow up note/[facility]   Imported By: Bubba Hales 04/17/2008 11:18:31  _____________________________________________________________________  External Attachment:    Type:   Image     Comment:   External Document

## 2010-10-25 NOTE — Progress Notes (Signed)
Summary: talk to nurse  Phone Note Call from Patient Call back at [phone]   Caller: Vara Guardian (daughter) Call For: nadel Summary of Call: pt not getting any better. she wants to talk to a nurse ONLY beacuse she wants to be advised.   Initial call taken by: Valinda Hoar,  Jan 24, 2010 1:00 PM  Follow-up for Phone Call        Spoke with pt' daughter.  She states that pt was just d/c'ed from hospital on 4/30.  Now pt is c/o chest tightness since this am.  This is new for him so she is very concerned.  Advised ov- sched him to see TP this afternoon at 4:15 pm and advised ER sooner if she feels that this is needed. Follow-up by: Vernie Murders,  Jan 24, 2010 2:24 PM

## 2010-10-25 NOTE — Medication Information (Signed)
Summary: rov/sp  Anticoagulant Therapy  Managed by: Bethena Midget, RN, BSN Referring MD: [name] MD, Lavera Guise MD: Riley Kill MD, Maisie Fus Indication 1: Atrial Fibrillation Lab Used: [facility] [place] Site: [address] INR POC 1.4 INR RANGE 2.0-3.0  Dietary changes: no    Health status changes: no    Bleeding/hemorrhagic complications: no    Recent/future hospitalizations: no    Any changes in medication regimen? yes       Details: Augmentin 1 tab BID started last Wednesday.   Recent/future dental: no  Any missed doses?: no       Is patient compliant with meds? yes      Comments: Restarted on 03/16/10  Allergies: No Known Drug Allergies  Anticoagulation Management History:      The patient is taking warfarin and comes in today for a routine follow up visit.  Positive risk factors for bleeding include an age of 75 years or older.  The bleeding index is 'intermediate risk'.  Positive CHADS2 values include History of CHF, History of HTN, and Age > 56 years old.  His last INR was 1.4 ratio.  Anticoagulation responsible provider: Riley Kill MD, Maisie Fus.  INR POC: 1.4.  Cuvette Lot#: [id number].  Exp: 05/2011.    Anticoagulation Management Assessment/Plan:      The patient's current anticoagulation dose is Warfarin sodium 2.5 mg tabs: *** HOLD ***.  The target INR is 2.0-3.0.  The next INR is due 03/31/2010.  Anticoagulation instructions were given to patient.  Results were reviewed/authorized by Bethena Midget, RN, BSN.  He was notified by Bethena Midget, RN, BSN.         Prior Anticoagulation Instructions: INR 3.3. Take 1/2 tablet on Tues, Thurs, Sat and 1 tablet on other days.  Recheck in 2-3 weeks.  Current Anticoagulation Instructions: INR 1.4 Take 5mg s tomorrow then resume 5mg s everyday except 2.5mg s on Tuesdays, Thursdays and Saturdays. Recheck in one week.

## 2010-10-25 NOTE — Progress Notes (Signed)
Summary: appointment  Phone Note Call from Patient   Caller: Patient Call For: Ryan Hess Summary of Call: dr [name] want pt to see dr Kriste Basque in 2 weeks Initial call taken by: Rickard Patience,  November 05, 2009 4:40 PM  Follow-up for Phone Call        Please advise when I can add pt to sched. Vernie Murders  November 05, 2009 4:49 PM   called and spoke with pt and explained to him that he already has appt with SN on feb 22 at 3pm. Randell Loop CMA  November 05, 2009 4:58 PM

## 2010-10-25 NOTE — Letter (Signed)
Summary: [place]   Imported By: Edmonia James 10/05/2008 15:21:28  _____________________________________________________________________  External Attachment:    Type:   Image     Comment:   External Document

## 2010-10-25 NOTE — Progress Notes (Signed)
Summary: hemoptysis  Phone Note Call from Patient Call back at Home Phone [phone]   Caller: Patient Call For: nadel Summary of Call: pt has been coughing up blood. says that it is red in color. says it began a wk ago with "a little red discharge" but has increased in size ea day. blood in each cough. pt had pna 2 months ago and was coughing up blood at that time as well. denies fever. no N or V. he has been using singulair and advair ea day- twice daily.  Initial call taken by: Tivis Ringer, CNA,  March 10, 2010 12:15 PM  Follow-up for Phone Call        pt states he has been coughing up blood on and off x 1 week, about 3 hrs ago had coughing spell with over a tsp of blood and bright red, pt states his b/p is low too and just doesn't feel good  ov with tp today at 3:15 Follow-up by: Philipp Deputy CMA,  March 10, 2010 1:59 PM

## 2010-10-25 NOTE — Medication Information (Signed)
Summary: rov/sp  Anticoagulant Therapy  Managed by: Weston Brass, PharmD Referring MD: [name] MD, Lavera Guise MD: Tenny Craw MD, Gunnar Fusi Indication 1: Atrial Fibrillation Lab Used: [facility] [place] Site: [address] INR POC 1.5 INR RANGE 2.0-3.0  Dietary changes: no    Health status changes: no    Bleeding/hemorrhagic complications: no    Recent/future hospitalizations: yes       Details: Had a CAT scan at the hospital, but doesn't recall why.  Pt reports everything was normal, except some mornings he wakes up with a dry throat and has some trouble swallowing.   Any changes in medication regimen? yes       Details: Pt has been confused on his coumadin dose, because his wife is also on Coumadin and he says there was an error at the drug store with mismatching the doses between his and his wifes prescriptions.    Recent/future dental: no  Any missed doses?: no       Is patient compliant with meds? yes       Allergies: No Known Drug Allergies  Anticoagulation Management History:      The patient is taking warfarin and comes in today for a routine follow up visit.  Positive risk factors for bleeding include an age of 35 years or older.  The bleeding index is 'intermediate risk'.  Positive CHADS2 values include History of CHF, History of HTN, and Age > 27 years old.  His last INR was 1.4 ratio.  Anticoagulation responsible provider: Tenny Craw MD, Gunnar Fusi.  INR POC: 1.5.  Cuvette Lot#: [id number].  Exp: 07/2011.    Anticoagulation Management Assessment/Plan:      The patient's current anticoagulation dose is Warfarin sodium 2.5 mg tabs: as directed  by the coumadin clinic.  The target INR is 2.0-3.0.  The next INR is due 08/16/2010.  Anticoagulation instructions were given to patient.  Results were reviewed/authorized by Weston Brass, PharmD.  He was notified by Haynes Hoehn, PharmD Candidate.         Prior Anticoagulation Instructions: INR 3.4  Skip today's dose of Coumadin  then resume same dose of 1/2 tablet every day except 1 tablet on Friday.  Recheck INR in 3 weeks.   Current Anticoagulation Instructions: INR 1.5  Today, take 2 tablets.  Thereafter, take 1 tablet every day of the week, except 2 tablets on Friday. Return to clinic in 2-3 weeks.

## 2010-10-25 NOTE — Assessment & Plan Note (Signed)
Summary: 2 week follow up///Ryan Hess   CC:  Post hospital ROV....  History of Present Illness: 77 y/o WM here for a follow up visit... he has multiple medical problems as noted below...     ~  March 15, 2009:  39mofollow up doing well- no new complaints or concerns... he saw DrGearhart 4/10- f/u thoracoabdominal aneurysm (s/p AAA &right common iliac aneurysm repair 1998 by DSheryn Bison... 134moT scan showed  ~no change in the 5cm aneurysm & he rec keep BP under control & f/u in another 1859mo he also had f/u DrWrenn for Urology- f/u Prostate Cancer s/p XRT finished 5/09... doing well w/o symptoms and PSA was down to 0.30- plans f/u another 62mo14mofinally saw DrNiCherly Hensen 54yr 352yriac f/u doing well- no change in meds and yearly ROV is planned...   ~  September 13, 2009:  he's had a good 62mo- 48mo c/o some right hip pain... known severe DJD w/ bilat TKR's DrOlin & pt will continue Tylenol/ OTC meds and f/u w/ Ortho... BP controlled on the Aten/ Norvasc/ Atacand but would like cheaper subst to the [facility]- change to LOSARTAN... Chol well controlled on diet + Lip10... OK Flu shot today...   ~  November 16, 2009:  Hosp 2/4 - 11/05/09 for CP- felt to be noncardiac in origin w/ cath showing nonobstructive CAD but he had new AFib, CHF w/ EF ~45% w/ HK & TEE showing left atrial appendage clot... he was diuresed & meds adjusted, placed on Coumadin w/ careful f/u by DrNishCherly Hensen CC... he had PV f/u w/ CT to check his thoracoabd aneurysm= 5.6cm (min larger serially) & f/u planned 62mo...37moom the pulm standpoint he had a bronchitic exac treated w/ Doxy, and reminded to take the Advair/ Spiriva regularly...  currently feeling better w/ Lasix/ KCl added & using inhalers regularly...  we will check BMet/ BNP today- all questions answered.    Current Problem List:  COPD (ICD-[id number] an ex-smoker, having quit in 1990 after 40 yrs of smoking... he has been exercising regularly and walking daily  ~1mi 5-669mys per week...  on ADVAIR 2[place].  ~  baseline CXR & CTChest w/ marked emphysema, atheromatous calcif, 5cm desc thorAA...   ~  CXR & CTA 2/11 in hosp showed cardiomeg + coronary calcif, advanced atherosclerosis of Ao w/ aneuryms 5.8cm into abd, underlying emphysema w/ interst edema & bilat effusions, no PE...  HYPERTENSION (ICD-401.9)                               **followed by DrNishanCherly Hensends... CONGESTIVE HEART FAILURE (ICD-428.0) ATRIAL FIBRILLATION (ICD-427.31) w/ clot in left atrial appendage... PREMATURE VENTRICULAR CONTRACTIONS, FREQUENT (ICD-427.69) - on ATENOLOL '50mg'$ /d, LOSARTAN '100mg'$ /d, & LASIx '20mg'$ Bid + KCl 10mEq/d 20mv Norvasc discontinued)... BP= 116/68 and feeling betterl... home BP checks are "all good" w/ high= 140... hx ACE cough in the past on Lisinopril... denies HA, fatigue, visual changes, recurrentCP, palipit, syncope, dyspnea, edema, etc...  ~  NuclearStressTest 1/05 was neg- no ischemia or infarct (+diaphrag attenuation), EF=56%...  ~  repeat Nuclear study 6/09 was neg- no ischemia, mild inferoapic thinning, not gated due to PVCs...  ~  2DEcho 6/09 showed mild dilated LV w/ EF= 45-50% but no regional wall motion abn, mild AoV calcif & AI, LA mild dil...  ~  12/10:  changed from Atacand to LOSARTAN to save $$  ~  2/11:  in [place] stopped & Lasix started...  PERIPHERAL VASCULAR DISEASE (ICD-443.9) - on ASA '81mg'$ /d... he is s/p infrarenal AAA repair w/ right common iliac aneurysm repair (via Ao Bi-iliac graft) in 1998 by DrLawson... also has a known desc thor AA measuring  ~5+cm and followed by DrGearhardt every 18 months...   ~  seen by DrGearhart 4/10 w/ CT scan showing  ~5cm thoracoabdominal aneurysm w/o signif change...   ~  seen by DrGearhart 2/11 in hosp w/ sl incr size of AAA- f/u planned 44mo  HYPERCHOLESTEROLEMIA (ICD-272.0) - on LIPITOR '10mg'$ /d...   ~  [place]/07 showed TChol 115, Tg 58, HDL 36, LDL 67  ~  FLP 5/08 showed TChol 118, TG 66, HDL 31, LDL 74   ~  FLP 5/09 showed TChol 126, TG 71, HDL 37, LDL 75  ~  FLP 6/10 showed TChol 116, TG 73, HDL 40, LDL 62  ~  2/11:  FLP not checked during the hospitalization...  DIVERTICULOSIS OF COLON (ICD-562.10) & COLONIC POLYPS (ICD-211.3) - hx polyps in 2003 = tubular adenoma...  ~  last colonoscopy 9/06 showed divertics, hems, no polyps.  PROSTATE CANCER (ICD-185) - eval by DrWrenn and they decided on XRT by DrKinard- finished 5/09 and he states that DrKinard "released me"... saw DrWrenn 11/09 and was doing well- f/u planned Q633mo.  ~  5/10: f/u by DrWrenn doing well... PSA= 0.30  ~  11/10:  f/u DrWrenn w/ PSA= 0.04  DEGENERATIVE JOINT DISEASE (ICD-715.90) - s/p bilat TKR's, [facility]- DrOlin...  ACTINIC SKIN DAMAGE (ICD-692.70) - followed by DrBrunetta Jeanshe knows to avoid sun exposure, use sun screen, etc...   Allergies (verified): No Known Drug Allergies  Past History:  Past Medical History:  COPD (ICD-496) HYPERTENSION (ICD-401.9) CONGESTIVE HEART FAILURE (ICD-428.0) ATRIAL FIBRILLATION (ICD-427.31) PREMATURE VENTRICULAR CONTRACTIONS, FREQUENT (ICD-427.69) PERIPHERAL VASCULAR DISEASE (ICD-443.9) THORACIC AORTIC ANEURYSM (ICD-441.2) HYPERCHOLESTEROLEMIA (ICD-272.0) DIVERTICULOSIS OF COLON (ICD-562.10) COLONIC POLYPS (ICD-211.3) PROSTATE CANCER (ICD-185) DEGENERATIVE JOINT DISEASE (ICD-715.90) ACTINIC SKIN DAMAGE (ICD-692.70)  Past Surgical History: S/P bilat TKR's S/P Abdominal Aortic Aneurysm Repair w/ Ao Bi-iliac graft in 1998 S/P appendectomy & left inguinal hernia repair  Family History: Reviewed history from 03/15/2009 and no changes required. mother died at age 75/ senile dementia father died at age 5842rom a stroke 6 siblings: 2 Bothers died- one age 929/ DM, vasc dis, heart dis; one age 75/ obesity & heart problems... 4 Sisters died- alzheimer's, heart dis, cancer ("in her back" ?myeloma)...  Social History: Reviewed history from 10/25/2009 and no changes  required. Married to [employer] children Ex-smoker- quit 1990, [id number] Social alcohol Retired steAdministrator, [employer]      See HPI       The patient complains of decreased hearing, dyspnea on exertion, and difficulty walking.  The patient denies anorexia, fever, weight loss, weight gain, vision loss, hoarseness, chest pain, syncope, peripheral edema, prolonged cough, headaches, hemoptysis, abdominal pain, melena, hematochezia, severe indigestion/heartburn, hematuria, incontinence, muscle weakness, suspicious skin lesions, transient blindness, depression, unusual weight change, abnormal bleeding, enlarged lymph nodes, and angioedema.    Vital Signs:  Patient profile:   86 7ar old male Height:      69 inches Weight:      202.50 pounds O2 Sat:      95 % on Room air Temp:     96.8 degrees F oral Pulse rate:   89 / minute BP sitting:   116 /  68  (left arm) Cuff size:   regular  Vitals Entered By: Elita Boone CMA (November 16, 2009 2:51 PM)  O2 Sat at Rest %:  95 O2 Flow:  Room air CC: Post hospital ROV... Is Patient Diabetic? No Pain Assessment Patient in pain? no      Comments meds updated today   Physical Exam  Additional Exam:  WD, WN, 77 y/o WM in NAD... GENERAL:  Alert & oriented; pleasant & cooperative... HEENT:  [place]/AT, EOM-wnl, PERRLA, EACs-clear, TMs-wnl, NOSE-clear, THROAT-clear & wnl. NECK:  Supple w/ fairROM; no JVD; normal carotid impulses w/o bruits; no thyromegaly or nodules palpated; no lymphadenopathy. CHEST:  decr BS bilat, clear to P & A; without wheezes/ rales/ or rhonchi. HEART:  irregular rhythm, gr1/6 SEM without rubs or gallops heard... ABDOMEN:  Soft & nontender; normal bowel sounds; no organomegaly or masses detected. EXT: s/p bilat TKR's, mod arthritic changes; no varicose veins/ +venous insuffic/ no edema. NEURO:  CN's intact;  gait abn; no focal neuro deficits... DERM:  No lesions noted; no rash  etc...    MISC. Report  Procedure date:  11/16/2009  Findings:      BMP (METABOL)   Sodium                    141 mEq/L                   135-145   Potassium                 4.1 mEq/L                   3.5-5.1   Chloride                  102 mEq/L                   96-112   Carbon Dioxide            31 mEq/L                    19-32   Glucose                   98 mg/dL                    70-99   BUN                       17 mg/dL                    6-23   Creatinine                1.4 mg/dL                   0.4-1.5   Calcium                   9.5 mg/dL                   8.4-10.5   GFR                       51.31 mL/min                >60   B-Type Natiuretic Peptide (BNPR)  B-Type Natriuetic Peptide                        [  H]  630.0 pg/mL                 0.0-100.0   Impression & Recommendations:  Problem # 1:  COPD (ICD-496) Stable w/ severe disease... continue Advair & Spiriva... His updated medication list for this problem includes:    Advair Diskus 250-50 Mcg/dose Aepb (Fluticasone-salmeterol) .[name]... 1 inhalation two times a day...    Spiriva Handihaler 18 Mcg Caps (Tiotropium bromide monohydrate) ..... Inhale contents of one capsule by handihaler daily  Problem # 2:  HYPERTENSION (ICD-401.9) Controlled-  same meds... The following medications were removed from the medication list:    Norvasc 10 Mg Tabs (Amlodipine besylate) .[name]... Take 1/2 tab by mouth once daily... His updated medication list for this problem includes:    Atenolol 50 Mg Tabs (Atenolol) .[name]... Take 1 tab by mouth once daily...    Losartan Potassium 100 Mg Tabs (Losartan potassium) .[name]... Take 1 tab by mouth once daily...    Lasix 20 Mg Tabs (Furosemide) .[name]... Take 2 tabs by mouth each am...  Orders: TLB-BMP (Basic Metabolic Panel-BMET) (99991111) TLB-BNP (B-Natriuretic Peptide) (83880-BNPR)  Problem # 3:  ATRIAL FIBRILLATION (ICD-427.31) Per Cherly Hensen-  on coumadin via CC, etc... The following  medications were removed from the medication list:    Norvasc 10 Mg Tabs (Amlodipine besylate) .[name]... Take 1/2 tab by mouth once daily... His updated medication list for this problem includes:    Warfarin Sodium 2.5 Mg Tabs (Warfarin sodium) .[name]... Take as directed    Adult Aspirin Ec Low Strength 81 Mg Tbec (Aspirin) ..... Once daily    Atenolol 50 Mg Tabs (Atenolol) .[name]... Take 1 tab by mouth once daily...  Problem # 4:  PERIPHERAL VASCULAR DISEASE (ICD-443.9) Followed by DrGearhart & felt to be a poor surg candidaste...  Problem # 5:  HYPERCHOLESTEROLEMIA (ICD-272.0) Controlled... same med + diet... His updated medication list for this problem includes:    Lipitor 10 Mg Tabs (Atorvastatin calcium) .[name]... Take 1 tab by mouth once daily...  Problem # 6:  PROSTATE CANCER (ICD-185) Followed by Urology & stable...  Problem # 7:  OTHER MEDICAL PROBLEMS AS NOTED>>> New CHF as noted>> labs show BNP still elevated at 630... REC-  change Lasix to '40mg'$  Qam, no salt, etc... He has f/u w/ Cherly Hensen for post hosp soon...  Complete Medication List: 1)  Advair Diskus 250-50 Mcg/dose Aepb (Fluticasone-salmeterol) .[name].. 1 inhalation two times a day... 2)  Spiriva Handihaler 18 Mcg Caps (Tiotropium bromide monohydrate) .... Inhale contents of one capsule by handihaler daily 3)  Warfarin Sodium 2.5 Mg Tabs (Warfarin sodium) .... Take as directed 4)  Adult Aspirin Ec Low Strength 81 Mg Tbec (Aspirin) .... Once daily 5)  Atenolol 50 Mg Tabs (Atenolol) .... Take 1 tab by mouth once daily.Marland KitchenMarland Kitchen 6)  Losartan Potassium 100 Mg Tabs (Losartan potassium) .... Take 1 tab by mouth once daily.Marland KitchenMarland Kitchen 7)  Lasix 20 Mg Tabs (Furosemide) .... Take 2 tabs by mouth each am... 8)  Klor-con 10 10 Meq Cr-tabs (Potassium chloride) .... Take 1 tablet by mouth once a day 9)  Lipitor 10 Mg Tabs (Atorvastatin calcium) .... Take 1 tab by mouth once daily... 10)  Multivitamins Tabs (Multiple vitamin) .... Take 1 tablet by mouth once a  day  Other Orders: Prescription Created Electronically [phone])  Patient Instructions: 1)  Today we updated your med list- see below.... 2)  We refilled your meds as well... 3)  today we did your follow up blood chemistries... please call the "  phone tree" in a few days for your lab results.Marland KitchenMarland Kitchen 4)  Continue your follow up in the coumadin clinic for careful regulation of your blood thinner... 5)  Call for any questions.Marland KitchenMarland Kitchen 6)  Keep your follow up appt in June... Prescriptions: KLOR-CON 10 10 MEQ CR-TABS (POTASSIUM CHLORIDE) Take 1 tablet by mouth once a day  #30 x prn   Entered and Authorized by:   Noralee Space MD   Signed by:   Noralee Space MD on 11/16/2009   Method used:   Print then Give to Patient   RxID:   [id number] LASIX 20 MG TABS (FUROSEMIDE) take 1 tab by mouth two times a day as directed...  #60 x prn   Entered and Authorized by:   Noralee Space MD   Signed by:   Noralee Space MD on 11/16/2009   Method used:   Print then Give to Patient   RxID:   [id number] SPIRIVA HANDIHALER 18 MCG CAPS (TIOTROPIUM BROMIDE MONOHYDRATE) inhale contents of one capsule by handihaler daily  #30 x prn   Entered and Authorized by:   Noralee Space MD   Signed by:   Noralee Space MD on 11/16/2009   Method used:   Print then Give to Patient   RxID:   [id number] ADVAIR DISKUS 250-50 MCG/DOSE AEPB (FLUTICASONE-SALMETEROL) 1 inhalation two times a day...  #1 disc x prn   Entered and Authorized by:   Noralee Space MD   Signed by:   Noralee Space MD on 11/16/2009   Method used:   Print then Give to Patient   RxID:   [id number]

## 2010-10-25 NOTE — Progress Notes (Signed)
Summary: come off coumadin- tooth extraction  Phone Note From Other Clinic Call back at [facility] Phone [phone]   Caller: lisa office [phone] Request: Talk with Nurse Summary of Call: pt need to come off coumadin - extraction of tooth.  Initial call taken by: Lorne Skeens,  August 15, 2010 3:28 PM  Follow-up for Phone Call        spoke with lisa at dr Onalee Hua taylor's office, pt has a tooth that has broken off and they are going to have to go into the bone to get it out. they would like to know if pt can hold coumadin for the procedure. will foward for dr [name] review .fax # [phone] Deliah Goody, RN  August 15, 2010 4:22 PM   Additional Follow-up for Phone Call Additional follow up Details #1::        Needs to coordinate lovenox overlap as he has had documented LAA clot in the past. Additional Follow-up by: Colon Branch, MD, [facility],  August 16, 2010 6:14 PM

## 2010-10-25 NOTE — Progress Notes (Signed)
Summary: 90 day refill on Advair to [facility]  Phone Note Call from Patient Call back at [phone]   Caller: Patient Call For: nadel Reason for Call: Refill Medication Summary of Call: Need rx for advair diskus 250-30mcg.//[facility]  Initial call taken by: Darletta Moll,  June 22, 2010 9:46 AM  Follow-up for Phone Call        per EMR, we sent rx for this to [facility] on 02/02/2010.  Called and spoke with pt.  Pt states he never got his meds and therefore requests a new rx be sent to [facility].  Informed pt rx sent.  Aundra Millet Reynolds LPN  June 22, 2010 10:14 AM     Prescriptions: ADVAIR DISKUS 250-50 MCG/DOSE AEPB (FLUTICASONE-SALMETEROL) 1 inhalation two times a day...  #180 x 3   Entered by:   Arman Filter LPN   Authorized by:   Michele Mcalpine MD   Signed by:   Arman Filter LPN on 45/40/9811   Method used:   Electronically to        [facility]* (retail)             ,          Ph: [phone]       Fax: [phone]   RxID:   [id number]

## 2010-10-25 NOTE — Medication Information (Signed)
Summary: rov/ln  Anticoagulant Therapy  Managed by: Bethena Midget, RN, BSN Referring MD: [name] MD, Lavera Guise MD: Gala Romney MD, Reuel Boom Indication 1: Atrial Fibrillation Lab Used: [facility] [place] Site: [address] INR POC 4.3 INR RANGE 2.0-3.0  Dietary changes: no    Health status changes: no    Bleeding/hemorrhagic complications: no    Recent/future hospitalizations: no    Any changes in medication regimen? no    Recent/future dental: no  Any missed doses?: no       Is patient compliant with meds? yes       Allergies: No Known Drug Allergies  Anticoagulation Management History:      The patient is taking warfarin and comes in today for a routine follow up visit.  Positive risk factors for bleeding include an age of 75 years or older.  The bleeding index is 'intermediate risk'.  Positive CHADS2 values include History of CHF, History of HTN, and Age > 15 years old.  His last INR was 1.4 ratio.  Anticoagulation responsible provider: Bensimhon MD, Reuel Boom.  INR POC: 4.3.  Cuvette Lot#: [id number].  Exp: 06/2011.    Anticoagulation Management Assessment/Plan:      The patient's current anticoagulation dose is Warfarin sodium 2.5 mg tabs: *** HOLD ***.  The target INR is 2.0-3.0.  The next INR is due 05/09/2010.  Anticoagulation instructions were given to patient.  Results were reviewed/authorized by Bethena Midget, RN, BSN.  He was notified by Bethena Midget, RN, BSN.         Prior Anticoagulation Instructions: INR 3.9  Hold Coumadin today and then change to 1/2 tab on Sunday, Tuesday, Thursday, and Saturday and 1 tab on Monday, Wednesday, and Friday.  Re-check INR in 2 weeks.     Current Anticoagulation Instructions: INR 4.3 Skip todays dose then change dose to 2.5mg s  everyday except 5mg s on Mondays and Fridays. Recheck in 2 weeks.   Appended Document: Orders Update    Clinical Lists Changes  Orders: Added new Test order of T-2 View CXR  ([id number]) - Signed

## 2010-10-25 NOTE — Assessment & Plan Note (Signed)
Summary: FOLLOW UP/ MBW   Chief Complaint:  6 month ROV....  History of Present Illness: 77 y/o WM here for a follow up visit... he has mult med problems- and has been stable over the last 6 months, and he has just completed his 40th and last XRT treatmernt for his prostate cancer...   Current Problem List:  COPD ([id number]) - he is an ex-smoker, having quit in 1990 after 40 yrs of smoking... he has been exercising regularly and walking daily...  ~  baseline CXR & CTChest w/ marked emphysema, atheromatous calcif, 5cm desc thorAA...   HYPERTENSION (ICD-401.9) - on ATENOLOL '50mg'$ /d, NORVASC '10mg'$ /d, & VERAPAMIL '240mg'$ /d... BP= 120/72 and feeling well... denies HA, fatigue, visual changes, CP, palipit, dizziness, syncope, dyspnea, edema, etc...  ~  NuclearStressTest 1/05 was neg- no ischemia or infarct (+diaphrag attenuation), EF=56%...  PERIPHERAL VASCULAR DISEASE (ICD-443.9) - on ASA '81mg'$ /d... he is s/p AAA repair and Ao-bifem bypass in 1998 by DrLawson... also hasa known desc thor AA measuring 4.9cm and followed by DrGearhardt... last CTChest = 10/08 no change...  HYPERCHOLESTEROLEMIA (ICD-272.0) - on LIPITOR '10mg'$ /d...   ~  [place] 5/07 showed TChol 115, Tg 58, HDL 36, LDL 67...  DIVERTICULOSIS OF COLON (ICD-562.10) COLONIC POLYPS (ICD-211.3) - hx polyps in 2003 = tubular adenoma...  ~  last colonoscopy 9/06 showed divertics, hems, no polyps.  PROSTATE CANCER (ICD-185) - eval by DrWrenn and they decided on XRT- currently finishing his 40th treatment from [facility]...  DEGENERATIVE JOINT DISEASE (ICD-715.90) - s/p bilat TKR's...  ACTINIC SKIN DAMAGE (ICD-692.70)       Current Allergies: No known allergies   Past Medical History:        COPD (ICD-496)    HYPERTENSION (ICD-401.9)    PERIPHERAL VASCULAR DISEASE (ICD-443.9)    HYPERCHOLESTEROLEMIA (ICD-272.0)    DIVERTICULOSIS OF COLON (ICD-562.10)    COLONIC POLYPS (ICD-211.3)    PROSTATE CANCER (ICD-185)    DEGENERATIVE JOINT  DISEASE (ICD-715.90)    ACTINIC SKIN DAMAGE (ICD-692.70)      Past Surgical History:    S/P bilat TKR's...     S/P Abdominal Aortic Aneurysm Repair    S/P appendectomy & left inguinal hernia repair   Family History:    mother died at age 15    father died at age 65 from a stroke    2 siblings    1 brother died at age 65 from heart problems    1 brother died at age 43  Social History:    pt Administrator, Civil Service    quit smoking in 1990    pt is married to [employer]    3 children    Review of Systems  The patient denies anorexia, fever, weight loss, weight gain, vision loss, decreased hearing, hoarseness, chest pain, syncope, peripheral edema, prolonged cough, hemoptysis, abdominal pain, melena, hematochezia, severe indigestion/heartburn, hematuria, incontinence, muscle weakness, suspicious skin lesions, transient blindness, difficulty walking, depression, unusual weight change, abnormal bleeding, enlarged lymph nodes, and angioedema.     Vital Signs:  Patient Profile:   77 Years Old Male Weight:      214.25 pounds O2 Sat:      95 % O2 treatment:    Room Air Temp:     97.0 degrees F oral Pulse rate:   54 / minute BP sitting:   120 / 72  (left arm)  Vitals Entered By: Ramiro Harvest CMA (Feb 12, 2008 10:44 AM)  Physical Exam  WD, WN, 77 y/o WM in NAD... GENERAL:  Alert & oriented; pleasant & cooperative... HEENT:  [place]/AT, EOM-wnl, PERRLA, EACs-clear, TMs-wnl, NOSE-clear, THROAT-clear & wnl. NECK:  Supple w/ fairROM; no JVD; normal carotid impulses w/o bruits; no thyromegaly or nodules palpated; no lymphadenopathy. CHEST:  decr BS bilat, clear to P & A; without wheezes/ rales/ or rhonchi. HEART:  Regular Rhythm;  gr1/6 SEM without rubs or gallops heard... ABDOMEN:  Soft & nontender; normal bowel sounds; no organomegaly or masses detected. EXT: s/p bilat TKR's, mod arthritic changes; no varicose veins/ +venous insuffic/ no edema. NEURO:  CN's intact;  gait  abn; no focal neuro deficits... DERM:  No lesions noted; no rash etc...       Impression & Recommendations:  Problem # 1:  COPD (ICD-496) Assessment: Unchanged Stable... continue same. Orders: Venipuncture [id number]) TLB-Lipid Panel (80061-LIPID) TLB-BMP (Basic Metabolic Panel-BMET) (99991111) TLB-CBC Platelet - w/Differential (85025-CBCD) TLB-Hepatic/Liver Function Pnl (80076-HEPATIC) TLB-TSH (Thyroid Stimulating Hormone) (84443-TSH)   Problem # 2:  HYPERTENSION (ICD-401.9) Assessment: Unchanged Stable... same meds. His updated medication list for this problem includes:    Atenolol 50 Mg Tabs (Atenolol) .[name]... Take 1 tab by mouth once daily...    Norvasc 10 Mg Tabs (Amlodipine besylate) .[name]... Take 1/2 tab by mouth once daily...    Verapamil Hcl Cr 240 Mg Cp24 (Verapamil hcl) .[name]... Take 1 tablet by mouth at bedtime  Problem # 3:  PERIPHERAL VASCULAR DISEASE (ICD-443.9) Assessment: Unchanged His ThorAA is stable and being followed...  Problem # 4:  HYPERCHOLESTEROLEMIA (ICD-272.0) Due for f/u FLP... His updated medication list for this problem includes:    Lipitor 10 Mg Tabs (Atorvastatin calcium) .[name]... Take 1 tab by mouth once daily...   Problem # 5:  PROSTATE CANCER (ICD-185) Assessment: Unchanged Currently finishing XRT from DrKinard...  Problem # 6:  DEGENERATIVE JOINT DISEASE (ICD-715.90) S/P bilat TKR's doing well... His updated medication list for this problem includes:    Adult Aspirin Ec Low Strength 81 Mg Tbec (Aspirin) ..... Once daily   Complete Medication List: 1)  Adult Aspirin Ec Low Strength 81 Mg Tbec (Aspirin) .... Once daily 2)  Atenolol 50 Mg Tabs (Atenolol) .... Take 1 tab by mouth once daily.Marland KitchenMarland Kitchen 3)  Norvasc 10 Mg Tabs (Amlodipine besylate) .... Take 1/2 tab by mouth once daily.Marland KitchenMarland Kitchen 4)  Verapamil Hcl Cr 240 Mg Cp24 (Verapamil hcl) .... Take 1 tablet by mouth at bedtime 5)  Lipitor 10 Mg Tabs (Atorvastatin calcium) .... Take 1 tab by mouth once  daily...   Patient Instructions: 1)  Today we updated your med list and refilled your perscriptions for 2009... 2)  We also did your follow up blood work... please call the "phone tree" in a few days for your lab results.Marland KitchenMarland Kitchen 3)  Stay as active as you possible can... 4)  Call fior any problems.... 5)  Please schedule a follow-up appointment in 6 months.   Prescriptions: LIPITOR 10 MG  TABS (ATORVASTATIN CALCIUM) take 1 tab by mouth once daily...  #30 x prn   Entered and Authorized by:   Noralee Space MD   Signed by:   Noralee Space MD on 02/12/2008   Method used:   Print then Give to Patient   RxID:   [id number] VERAPAMIL HCL CR 240 MG  CP24 (VERAPAMIL HCL) Take 1 tablet by mouth at bedtime  #30 x prn   Entered and Authorized by:   Noralee Space MD   Signed by:   Nicki Reaper  Roddie Mc MD on 02/12/2008   Method used:   Print then Give to Patient   RxID:   [id number] NORVASC 10 MG  TABS (AMLODIPINE BESYLATE) take 1/2 tab by mouth once daily...  #30 x prn   Entered and Authorized by:   Noralee Space MD   Signed by:   Noralee Space MD on 02/12/2008   Method used:   Print then Give to Patient   RxID:   [id number] ATENOLOL 50 MG  TABS (ATENOLOL) take 1 tab by mouth once daily...  #30 x prn   Entered and Authorized by:   Noralee Space MD   Signed by:   Noralee Space MD on 02/12/2008   Method used:   Print then Give to Patient   RxIDHX:[id number]  ]

## 2010-10-25 NOTE — Medication Information (Signed)
Summary: rov/tm  Anticoagulant Therapy  Managed by: Cloyde Reams, RN, BSN Referring MD: [name] MD, Lavera Guise MD: Ladona Ridgel MD, Sharlot Gowda Indication 1: Atrial Fibrillation Lab Used: [facility] [place] Site: [address] INR POC 3.4 INR RANGE 2.0-3.0  Dietary changes: no    Health status changes: no    Bleeding/hemorrhagic complications: no    Recent/future hospitalizations: no    Any changes in medication regimen? no    Recent/future dental: no  Any missed doses?: no       Is patient compliant with meds? yes       Allergies: No Known Drug Allergies  Anticoagulation Management History:      The patient is taking warfarin and comes in today for a routine follow up visit.  Positive risk factors for bleeding include an age of 75 years or older.  The bleeding index is 'intermediate risk'.  Positive CHADS2 values include History of CHF, History of HTN, and Age > 15 years old.  His last INR was 1.4 ratio.  Anticoagulation responsible provider: Ladona Ridgel MD, Sharlot Gowda.  INR POC: 3.4.  Cuvette Lot#: [id number].  Exp: 05/2011.    Anticoagulation Management Assessment/Plan:      The patient's current anticoagulation dose is Warfarin sodium 2.5 mg tabs: *** HOLD ***.  The target INR is 2.0-3.0.  The next INR is due 04/11/2010.  Anticoagulation instructions were given to patient.  Results were reviewed/authorized by Cloyde Reams, RN, BSN.  He was notified by Cloyde Reams RN.         Prior Anticoagulation Instructions: INR 1.4 Take 5mg s tomorrow then resume 5mg s everyday except 2.5mg s on Tuesdays, Thursdays and Saturdays. Recheck in one week.   Current Anticoagulation Instructions: INR 3.4  Skip tomorrow's dosage of coumadin, then resume same dosage 1 tablet daily except 1/2 tablet on Tuesdays, Thursdays, and Saturdays.  Recheck 10 days.

## 2010-10-25 NOTE — Progress Notes (Signed)
Summary: chest pain  Phone Note Call from Patient Call back at Home Phone [phone]   Caller: Daughter Reason for Call: Talk to Nurse Summary of Call: Per pt daughter Lupita Leash. pt had really bad chest pain last night with SOB. Pt still having severe SOB.  Initial call taken by: Edman Circle,  October 29, 2009 12:56 PM  Follow-up for Phone Call        I spoke with the pt and he said around 9:30 last night he had an unbearable pain in the center of his chest, "felt like I got shot in the chest". Family member also said he was SOB "like hyperventilating" with episode. The pt's BP was 143/77 and he developed chills.  The pt wrapped up in blankets, got in bed and the   episode resolved after a couple of hours.  Today the pt does not feel like getting out of bed.  The pt states he feels a tightness and thickness in his chest when he breaths.  Vitals at this time pulse 104, BP 123/87.  The pt is coughing up clear and yellow sputum.  The pt denies fever (has not checked temperature).  SOB is about the same as when he saw Dr [name].  I will discuss this pt with the DOD. Follow-up by: Julieta Gutting, RN, BSN,  October 29, 2009 1:36 PM  Additional Follow-up for Phone Call Additional follow up Details #1::        Please refer the patient to the ER.  Given his "severe" chest pain, I am concerned for ACS.  He has been seen twice  (Dr [name] and Ms Clent Ridges) within the last week as an outpatient without resolution.  If his symptoms are escalating, then he should go to the ER.  Otherwise, he will follow as scheduled with Dr [name] on Monday. Additional Follow-up by: Hillis Range, MD,  October 29, 2009 1:48 PM    Additional Follow-up for Phone Call Additional follow up Details #2::    I spoke with the pt's daughter and she will take the pt to the [facility] ER for evaluation.  Ward Givens notified. Follow-up by: Julieta Gutting, RN, BSN,  October 29, 2009 1:52 PM

## 2010-10-25 NOTE — Medication Information (Signed)
Summary: rov/jm  Anticoagulant Therapy  Managed by: Weston Brass, PharmD Referring MD: [name] MD, Lavera Guise MD: Antoine Poche MD, Fayrene Fearing Indication 1: Atrial Fibrillation Lab Used: [facility] [place] Site: [address] INR POC 3.4 INR RANGE 2.0-3.0  Dietary changes: no    Health status changes: no    Bleeding/hemorrhagic complications: no    Recent/future hospitalizations: no    Any changes in medication regimen? no    Recent/future dental: no  Any missed doses?: no       Is patient compliant with meds? yes       Allergies: No Known Drug Allergies  Anticoagulation Management History:      The patient is taking warfarin and comes in today for a routine follow up visit.  Positive risk factors for bleeding include an age of 51 years or older.  The bleeding index is 'intermediate risk'.  Positive CHADS2 values include History of CHF, History of HTN, and Age > 63 years old.  His last INR was 1.4 ratio.  Anticoagulation responsible provider: Antoine Poche MD, Fayrene Fearing.  INR POC: 3.4.  Cuvette Lot#: [id number].  Exp: 07/2011.    Anticoagulation Management Assessment/Plan:      The patient's current anticoagulation dose is Warfarin sodium 2.5 mg tabs: as directed  by the coumadin clinic.  The target INR is 2.0-3.0.  The next INR is due 07/25/2010.  Anticoagulation instructions were given to patient.  Results were reviewed/authorized by Weston Brass, PharmD.  He was notified by Weston Brass PharmD.         Prior Anticoagulation Instructions: INR 2.8  Continue taking one-half tablet every day except for one tablet on Friday.  Return to clinic in three weeks.    Current Anticoagulation Instructions: INR 3.4  Skip today's dose of Coumadin then resume same dose of 1/2 tablet every day except 1 tablet on Friday.  Recheck INR in 3 weeks.

## 2010-10-25 NOTE — Assessment & Plan Note (Signed)
Summary: Acute NP office visit - SOB   CC:  increased SOB, wheezing, and prod cough with clear/yellow mucus x1week - denies f/c/s.  History of Present Illness: 77 y/o WM  with known hx of HTN, PVD  09/13/09--here for a follow up visit... he has mult med problems- including HTN - on ATENOLOL '50mg'$ /d, NORVASC '10mg'$ /d, & VERAPAMIL '240mg'$ /d... ~  NuclearStressTest 1/05 was neg- no ischemia or infarct (+diaphrag attenuation), EF=56%...and PERIPHERAL VASCULAR DISEASE (ICD-443.9) - on ASA '81mg'$ /d... he is s/p AAA repair and Ao-bifem bypass in 1998 by DrLawson... also hasa known desc thor AA measuring 4.9cm and followed by DrGearhardt... last CTChest = 10/08 no change... and has been stable over the last 6 months, and he has just completed his 40th and last XRT treatmernt for his prostate cancer..  October 13, 2009 --Presents for increased SOB, wheezing, prod cough with clear/yellow mucus x1week. OTC not helping. Denies chest pain, orthopnea, hemoptysis, fever, n/v/d, edema, headache,recent travel or antibiotics.  Wears out easily. Congestion is thick at times.  Symptoms worse last 2 days. Got dyspneic taking trash out this am.     Medications Prior to Update: 1)  Adult Aspirin Ec Low Strength 81 Mg  Tbec (Aspirin) .... Once Daily 2)  Atenolol 50 Mg  Tabs (Atenolol) .... Take 1 Tab By Mouth Once Daily.Marland KitchenMarland Kitchen 3)  Norvasc 10 Mg  Tabs (Amlodipine Besylate) .... Take 1/2 Tab By Mouth Once Daily.Marland KitchenMarland Kitchen 4)  Losartan Potassium 100 Mg Tabs (Losartan Potassium) .... Take 1 Tab By Mouth Once Daily.Marland KitchenMarland Kitchen 5)  Lipitor 10 Mg  Tabs (Atorvastatin Calcium) .... Take 1 Tab By Mouth Once Daily.Marland KitchenMarland Kitchen 6)  Multivitamins  Tabs (Multiple Vitamin) .... Take 1 Tablet By Mouth Once A Day  Current Medications (verified): 1)  Adult Aspirin Ec Low Strength 81 Mg  Tbec (Aspirin) .... Once Daily 2)  Atenolol 50 Mg  Tabs (Atenolol) .... Take 1 Tab By Mouth Once Daily.Marland KitchenMarland Kitchen 3)  Norvasc 10 Mg  Tabs (Amlodipine Besylate) .... Take 1/2 Tab By Mouth Once  Daily.Marland KitchenMarland Kitchen 4)  Losartan Potassium 100 Mg Tabs (Losartan Potassium) .... Take 1 Tab By Mouth Once Daily.Marland KitchenMarland Kitchen 5)  Lipitor 10 Mg  Tabs (Atorvastatin Calcium) .... Take 1 Tab By Mouth Once Daily.Marland KitchenMarland Kitchen 6)  Multivitamins  Tabs (Multiple Vitamin) .... Take 1 Tablet By Mouth Once A Day  Allergies (verified): No Known Drug Allergies  Past History:  Past Medical History: Last updated: 09/13/2009 COPD (ICD-496) HYPERTENSION (ICD-401.9) PREMATURE VENTRICULAR CONTRACTIONS, FREQUENT (ICD-427.69) PERIPHERAL VASCULAR DISEASE (ICD-443.9) THORACIC AORTIC ANEURYSM (ICD-441.2) HYPERCHOLESTEROLEMIA (ICD-272.0) DIVERTICULOSIS OF COLON (ICD-562.10) COLONIC POLYPS (ICD-211.3) PROSTATE CANCER (ICD-185) DEGENERATIVE JOINT DISEASE (ICD-715.90) ACTINIC SKIN DAMAGE (ICD-692.70)  Past Surgical History: Last updated: 09/13/2009 S/P bilat TKR's...  S/P Abdominal Aortic Aneurysm Repair w/ Ao Bi-iliac graft in 1998 S/P appendectomy & left inguinal hernia repair  Family History: Last updated: 03-20-2009 mother died at age 59 w/ senile dementia father died at age 68 from a stroke 6 siblings: 2 Bothers died- one age 84 w/ DM, vasc dis, heart dis; one age 77 w/ obesity & heart problems... 4 Sisters died- alzheimer's, heart dis, cancer ("in her back" ?myeloma)...  Social History: Last updated: 03/20/2009 Married to Gilmore Tsuda 3 children Ex-smoker- quit 1990 Social alcohol Retired Administrator, [employer] Service  Risk Factors: Exercise: yes (02/09/2009)  Risk Factors: Smoking Status: quit (08/26/2008)  Review of Systems      See HPI  Vital Signs:  Patient profile:   77 year old male Height:      69 inches  Weight:      210 pounds O2 Sat:      97 % on Room air Temp:     97 degrees F oral Pulse rate:   50 / minute BP sitting:   130 / 74  (left arm) Cuff size:   regular  Vitals Entered By: Parke Poisson CNA (October 13, 2009 10:11 AM)  O2 Flow:  Room air CC: increased SOB, wheezing, prod cough with clear/yellow  mucus x1week - denies f/c/s Is Patient Diabetic? No Comments Medications reviewed with patient Daytime contact number verified with patient. Parke Poisson CNA  October 13, 2009 10:11 AM    Physical Exam  Additional Exam:  WD, WN, 77 y/o WM in NAD... GENERAL:  Alert & oriented; pleasant & cooperative... HEENT:  [place]/AT, EOM-wnl, PERRLA, EACs-clear, TMs-wnl, NOSE-clear, THROAT-clear & wnl. NECK:  Supple w/ fairROM; no JVD; normal carotid impulses w/o bruits; no thyromegaly or nodules palpated; no lymphadenopathy. CHEST:  decr BS bilat, clear to P & A; without wheezes/ rales/ or rhonchi. HEART:  regular rhythm, gr1/6 SEM without rubs or gallops heard... ABDOMEN:  Soft & nontender; normal bowel sounds; no organomegaly or masses detected. EXT: s/p bilat TKR's, mod arthritic changes; no varicose veins/ +venous insuffic/ no edema. NEURO:  CN's intact;  gait abn; no focal neuro deficits... DERM:  No lesions noted; no rash etc...     Impression & Recommendations:  Problem # 1:  COPD (ICD-496) Exacerbation REC: xopenex neb today in office  Augmentin '875mg'$  two times a day for 7 days Mucinex DM two times a day as needed cough/congestion Please contact office for sooner follow up if symptoms do not improve or worsen  follow up Dr. Lenna Gilford as scheduled.   Medications Added to Medication List This Visit: 1)  Augmentin 875-125 Mg Tabs (Amoxicillin-pot clavulanate) .[name].. 1 by mouth two times a day  Complete Medication List: 1)  Adult Aspirin Ec Low Strength 81 Mg Tbec (Aspirin) .... Once daily 2)  Atenolol 50 Mg Tabs (Atenolol) .... Take 1 tab by mouth once daily.Marland KitchenMarland Kitchen 3)  Norvasc 10 Mg Tabs (Amlodipine besylate) .... Take 1/2 tab by mouth once daily.Marland KitchenMarland Kitchen 4)  Losartan Potassium 100 Mg Tabs (Losartan potassium) .... Take 1 tab by mouth once daily.Marland KitchenMarland Kitchen 5)  Lipitor 10 Mg Tabs (Atorvastatin calcium) .... Take 1 tab by mouth once daily.Marland KitchenMarland Kitchen 6)  Multivitamins Tabs (Multiple vitamin) .... Take 1 tablet by mouth  once a day 7)  Augmentin 875-125 Mg Tabs (Amoxicillin-pot clavulanate) .[name].. 1 by mouth two times a day  Other Orders: Est. Patient Level III [id number])  Patient Instructions: 1)  Augmentin '875mg'$  two times a day for 7 days 2)  Mucinex DM two times a day as needed cough/congestion 3)  Please contact office for sooner follow up if symptoms do not improve or worsen  4)  follow up Dr. Lenna Gilford as scheduled.  Prescriptions: AUGMENTIN 875-125 MG TABS (AMOXICILLIN-POT CLAVULANATE) 1 by mouth two times a day  #14 x 0   Entered and Authorized by:   Rexene Edison NP   Signed by:   Jazzmin Newbold NP on 10/13/2009   Method used:   Electronically to        [facility]. # [id number]* (retail)       [address]       [place], [place]  [address]       Ph: [phone] or [phone]       Fax: [phone]  [id number]    Immunization History:  Pneumovax Immunization History:    Pneumovax:  historical (09/25/2006)   Appended Document: neb treatment documentation    Clinical Lists Changes  Orders: Added new Service order of Nebulizer Tx [phone]) - Signed

## 2010-10-25 NOTE — Progress Notes (Signed)
Summary: restart coumdain//follow up appt in august  Phone Note Other Incoming   Summary of Call: per SN----he is starting pt back on the coumadin therapy---will start with coumdain 5mg    1/2 tab x 3 days--monday, wednesday and friday and 1 tablet x 4 days--tuesday, thursday, saturday and sunday---called and spoke with sally at coumadin clinic and she is aware of restart---SN would like to keep the INR around 2.0 and made an appt for wednesday 6-29 at 11:45 for 1 week recheck.    2.   called [facility] on [address] and given 1 refill to the augmentin 875.   3.   appt made for 6-8 wk follow up with SN for 8-10 at 3pm.  pt is aware to come in early for repeat cxr.

## 2010-10-25 NOTE — Medication Information (Signed)
Summary: rov/sp  Anticoagulant Therapy  Managed by: Weston Brass, PharmD Referring MD: [name] MD, Lavera Guise MD: Tenny Craw MD, Gunnar Fusi Indication 1: Atrial Fibrillation Lab Used: [facility] [place] Site: [address] INR POC 2.5 INR RANGE 2.0-3.0  Dietary changes: no    Health status changes: no    Bleeding/hemorrhagic complications: no    Recent/future hospitalizations: no    Any changes in medication regimen? yes       Details: Has been on Lovenox injections  Recent/future dental: no  Any missed doses?: yes     Details: Restarted Coumadin on Tuesday after procedure  Is patient compliant with meds? yes       Allergies: No Known Drug Allergies  Anticoagulation Management History:      Positive risk factors for bleeding include an age of 75 years or older.  The bleeding index is 'intermediate risk'.  Positive CHADS2 values include History of CHF, History of HTN, and Age > 75 years old.  His last INR was 1.4 ratio.  Anticoagulation responsible provider: Tenny Craw MD, Gunnar Fusi.  INR POC: 2.5.  Exp: 07/2011.    Anticoagulation Management Assessment/Plan:      The patient's current anticoagulation dose is Warfarin sodium 2.5 mg tabs: as directed  by the coumadin clinic.  The target INR is 2.0-3.0.  The next INR is due 09/13/2010.  Anticoagulation instructions were given to patient.  Results were reviewed/authorized by Weston Brass, PharmD.  He was notified by Weston Brass PharmD.         Prior Anticoagulation Instructions: INR 3.2  11/30- Coumadin 1/2 tablet  12/1- Coumadin 1 tablet 12/2- No Coumadin or Lovenox 12/3- Inject Lovenox 120mg  syringe into stomach once daily in AM 12/4- Lovenox 120mg  injection in AM  12/5- Lovenox 120mg  injection in AM (prior to 10am) 12/6- Day of Procedure.  That pm, restart Coumadin with 2 tablets. 12/7- Lovenox 120mg  injection once daily and Coumadin 2 tablets 12/8- Lovenox 120mg  injection once daily and Coumadin 1 tablet 12/9- Coumadin  Clinic Appt.   Current Anticoagulation Instructions: INR 2.5  Stop Lovenox.  Continue same dose of Coumadin- 1 tablet every day except 2 tablets on Friday.  Recheck INR in 2 weeks.

## 2010-10-25 NOTE — Progress Notes (Signed)
Summary: SOB  Phone Note Call from Patient Call back at Home Phone [phone]   Caller: Patient Call For: nadel Reason for Call: Talk to Nurse Summary of Call: pt says he is having trouble sleeping, sob. Initial call taken by: Eugene Gavia,  October 13, 2009 8:30 AM  Follow-up for Phone Call        called, spoke with pt.  Pt states approx 1 wk ago he started to notice SOB and has never had this problem before.  states he will wake up in the middle of the night and "can't breath."  Is also having some SOB with activty but it's worse when laying down.  Informed pt he should come in to be seen since this is a new onset.  He is ok with this.  OV schedule with TP for this morning at 10:15.  Pt aware.   Follow-up by: Gweneth Dimitri RN,  October 13, 2009 9:28 AM

## 2010-10-25 NOTE — Medication Information (Signed)
Summary: rov/sp  Anticoagulant Therapy  Managed by: Weston Brass, PharmD Referring MD: [name] MD, Lavera Guise MD: Jens Som MD, Arlys John Indication 1: Atrial Fibrillation Lab Used: [facility] [place] Site: [address] INR POC 3.6 INR RANGE 2.0-3.0  Dietary changes: no    Health status changes: no    Bleeding/hemorrhagic complications: no    Recent/future hospitalizations: no    Any changes in medication regimen? yes       Details: off antibiotics   Recent/future dental: no  Any missed doses?: yes     Details: held Coumadin x 3 days after INR 7.1  Is patient compliant with meds? yes       Allergies: No Known Drug Allergies  Anticoagulation Management History:      The patient is taking warfarin and comes in today for a routine follow up visit.  Positive risk factors for bleeding include an age of 7 years or older.  The bleeding index is 'intermediate risk'.  Positive CHADS2 values include History of CHF, History of HTN, and Age > 85 years old.  His last INR was 7.1 ratio.  Anticoagulation responsible provider: Jens Som MD, Arlys John.  INR POC: 3.6.  Cuvette Lot#: [id number].  Exp: 04/2011.    Anticoagulation Management Assessment/Plan:      The patient's current anticoagulation dose is Warfarin sodium 2.5 mg tabs: take as directed.  The target INR is 2.0-3.0.  The next INR is due 02/10/2010.  Anticoagulation instructions were given to patient.  Results were reviewed/authorized by Weston Brass, PharmD.  He was notified by Weston Brass PharmD.         Prior Anticoagulation Instructions: INR 6.0- sent to Lab INR 7.1 from lab  Spoke with pt.  Hold Coumadin today, tomorrow, and Sunday then take 1/2 tablet on Monday.  Appt with [facility] on Tuesday.  He is aware to go to ER with any signs of bleeding  Current Anticoagulation Instructions: INR 3.6  Skip today's dose of Coumadin then decrease dose to 1 tablet every day except 1/2 tablet on Monday and Friday

## 2010-10-25 NOTE — Medication Information (Signed)
Summary: rov/eac  Anticoagulant Therapy  Managed by: Cloyde Reams, RN, BSN Referring MD: [name] MD, Lavera Guise MD: Myrtis Ser MD, Tinnie Gens Indication 1: Atrial Fibrillation Lab Used: [facility] [place] Site: [address] INR POC 2.4 INR RANGE 2.0-3.0  Dietary changes: no    Health status changes: no    Bleeding/hemorrhagic complications: no    Recent/future hospitalizations: no    Any changes in medication regimen? no    Recent/future dental: no  Any missed doses?: no       Is patient compliant with meds? yes       Allergies (verified): No Known Drug Allergies  Anticoagulation Management History:      The patient is taking warfarin and comes in today for a routine follow up visit.  Positive risk factors for bleeding include an age of 75 years or older.  The bleeding index is 'intermediate risk'.  Positive CHADS2 values include History of CHF, History of HTN, and Age > 75 years old.  His last INR was 12.6 ratio.  Anticoagulation responsible provider: Myrtis Ser MD, Tinnie Gens.  INR POC: 2.4.  Cuvette Lot#: [id number].  Exp: 01/2011.    Anticoagulation Management Assessment/Plan:      The patient's current anticoagulation dose is Warfarin sodium 2.5 mg tabs: take as directed.  The target INR is 2.0-3.0.  The next INR is due 12/07/2009.  Anticoagulation instructions were given to patient.  Results were reviewed/authorized by Cloyde Reams, RN, BSN.  He was notified by Cloyde Reams RN.         Prior Anticoagulation Instructions: INR 2.1  Take 1 tablet today.  Then take 1/2 tablet on Friday and Sunday and take 1 tablet all other days.  Return to clinic in 1 week.    Current Anticoagulation Instructions: INR 2.4  Continue on same dosage 1 tablet daily except 1/2 tablet on Sundays and Fridays.  Recheck in 1 week.

## 2010-10-25 NOTE — Medication Information (Signed)
Summary: rov/tm  Anticoagulant Therapy  Managed by: Weston Brass, PharmD Referring MD: [name] MD, Lavera Guise MD: Myrtis Ser MD, Tinnie Gens Indication 1: Atrial Fibrillation Lab Used: [facility] [place] Site: [address] INR POC 3.2 INR RANGE 2.0-3.0  Dietary changes: yes       Details: Has eaten extra leafy greens this past week to pull down INR  Health status changes: no    Bleeding/hemorrhagic complications: no    Recent/future hospitalizations: no    Any changes in medication regimen? no    Recent/future dental: no  Any missed doses?: no       Is patient compliant with meds? yes       Allergies: No Known Drug Allergies  Anticoagulation Management History:      The patient is taking warfarin and comes in today for a routine follow up visit.  Positive risk factors for bleeding include an age of 55 years or older.  The bleeding index is 'intermediate risk'.  Positive CHADS2 values include History of CHF, History of HTN, and Age > 11 years old.  His last INR was 1.4 ratio.  Anticoagulation responsible provider: Myrtis Ser MD, Tinnie Gens.  INR POC: 3.2.  Cuvette Lot#: [id number].  Exp: 06/2011.    Anticoagulation Management Assessment/Plan:      The patient's current anticoagulation dose is Warfarin sodium 2.5 mg tabs: as directed  by the coumadin clinic.  The target INR is 2.0-3.0.  The next INR is due 05/23/2010.  Anticoagulation instructions were given to patient.  Results were reviewed/authorized by Weston Brass, PharmD.  He was notified by Gweneth Fritter, PharmD Candidate.         Prior Anticoagulation Instructions: INR 4.3 Skip todays dose then change dose to 2.5mg s  everyday except 5mg s on Mondays and Fridays. Recheck in 2 weeks.   Current Anticoagulation Instructions: INR 3.2  Take 1/2 tablet (2.5mg ) every day except take 1 tablet (5mg ) on Fridays.  Recheck in 2 weeks.

## 2010-10-25 NOTE — Medication Information (Signed)
Summary: rov/ewj  Anticoagulant Therapy  Managed by: Weston Brass, PharmD Referring MD: [name] MD, Lavera Guise MD: [name] MD, Theron Arista Indication 1: Atrial Fibrillation Lab Used: [facility] [place] Site: [address] INR POC 3.9 INR RANGE 2.0-3.0  Dietary changes: yes       Details: pt states he may have been eating more greens than usual  Health status changes: no    Bleeding/hemorrhagic complications: no    Recent/future hospitalizations: no    Any changes in medication regimen? no    Recent/future dental: no  Any missed doses?: no       Is patient compliant with meds? yes       Allergies: No Known Drug Allergies  Anticoagulation Management History:      The patient is taking warfarin and comes in today for a routine follow up visit.  Positive risk factors for bleeding include an age of 47 years or older.  The bleeding index is 'intermediate risk'.  Positive CHADS2 values include History of CHF, History of HTN, and Age > 52 years old.  His last INR was 1.4 ratio.  Anticoagulation responsible provider: [name] MD, Theron Arista.  INR POC: 3.9.  Cuvette Lot#: [id number].  Exp: 06/2011.    Anticoagulation Management Assessment/Plan:      The patient's current anticoagulation dose is Warfarin sodium 2.5 mg tabs: *** HOLD ***.  The target INR is 2.0-3.0.  The next INR is due 04/25/2010.  Anticoagulation instructions were given to patient.  Results were reviewed/authorized by Weston Brass, PharmD.  He was notified by Dillard Cannon.         Prior Anticoagulation Instructions: INR 3.4  Skip tomorrow's dosage of coumadin, then resume same dosage 1 tablet daily except 1/2 tablet on Tuesdays, Thursdays, and Saturdays.  Recheck 10 days.  Current Anticoagulation Instructions: INR 3.9  Hold Coumadin today and then change to 1/2 tab on Sunday, Tuesday, Thursday, and Saturday and 1 tab on Monday, Wednesday, and Friday.  Re-check INR in 2 weeks.

## 2010-10-25 NOTE — Letter (Signed)
[facility]: [facility]   Imported By: Sherian Rein 05/31/2010 14:15:40  _____________________________________________________________________  External Attachment:    Type:   Image     Comment:   External Document

## 2010-10-25 NOTE — Medication Information (Signed)
Summary: Ryan Hess  Anticoagulant Therapy  Managed by: Weston Brass, PharmD Referring MD: [name] MD, Lavera Guise MD: Gala Romney MD, Reuel Boom Indication 1: Atrial Fibrillation Lab Used: [facility] [place] Site: [address] INR POC 2.6 INR RANGE 2.0-3.0  Dietary changes: no    Health status changes: no    Bleeding/hemorrhagic complications: no    Recent/future hospitalizations: yes       Details: DCCV last week; f/u with Dr. [name] next week   Any changes in medication regimen? no    Recent/future dental: no  Any missed doses?: no       Is patient compliant with meds? yes       Allergies: No Known Drug Allergies  Anticoagulation Management History:      The patient is taking warfarin and comes in today for a routine follow up visit.  Positive risk factors for bleeding include an age of 75 years or older.  The bleeding index is 'intermediate risk'.  Positive CHADS2 values include History of CHF, History of HTN, and Age > 61 years old.  His last INR was 2.1 ratio.  Anticoagulation responsible provider: Bensimhon MD, Reuel Boom.  INR POC: 2.6.  Cuvette Lot#: [id number].  Exp: 01/2011.    Anticoagulation Management Assessment/Plan:      The patient's current anticoagulation dose is Warfarin sodium 2.5 mg tabs: take as directed.  The target INR is 2.0-3.0.  The next INR is due 01/28/2010.  Anticoagulation instructions were given to patient.  Results were reviewed/authorized by Weston Brass, PharmD.  He was notified by Weston Brass PharmD.         Prior Anticoagulation Instructions: INR 2.0  Take 2 tablets today then resume same dosage 1 tablet daily except 1/2 tablet on Fridays.  Recheck in 1 week.    Current Anticoagulation Instructions: INR 2.6  Continue same dose of 1 tablet every day except 1/2 tablet on Friday

## 2010-10-25 NOTE — Assessment & Plan Note (Signed)
Summary: F3M/DM   Visit Type:  3 months follow up  CC:   coughing up blood with mucus.  History of Present Illness: Ryan Hess is seen today in F/U for afib.  He was initially seen in 4/11 during a pneumonia where he had afib.  Initial TEE showed LAA clot.  F/U TEE about a month ago showed no clot and he was successfully cardioverted.  He has been doing well with baseline dyspnea from his lung disease.  He denies SSCP, palpitations, syncope, edema or TIA's  ECG today confirms maint. of NSR with occasional PVC's.  His INR's have been Rx.  He had a non-ischemic myovue with normal EF on 1/11.  He has a history of prostate CA and sees Dr Annabell Howells.  His PSA has bumped slightly and is being followed closely.  Since his pneumonia he has lost about 15 lbs but is stable now.  He has had some hemoptysis and was just started on Augmentin by Jeanmarie Plant.  He needs his PT rechecked today.  He has a history of aortobifem for aneurysm and this is followed by Dr Hart Rochester  Current Problems (verified): 1)  Dyspnea  (ICD-786.05) 2)  Weakness  (ICD-780.79) 3)  COPD  (ICD-496) 4)  Pneumonia, Organism Unspecified  (ICD-486) 5)  Pulmonary Nodule  (ICD-518.89) 6)  Hypertension  (ICD-401.9) 7)  Congestive Heart Failure  (ICD-428.0) 8)  Atrial Fibrillation  (ICD-427.31) 9)  Coumadin Therapy  (ICD-V58.61) 10)  Premature Ventricular Contractions, Frequent  (ICD-427.69) 11)  Peripheral Vascular Disease  (ICD-443.9) 12)  Thoracic Aortic Aneurysm  (ICD-441.2) 13)  Hypercholesterolemia  (ICD-272.0) 14)  Diverticulosis of Colon  (ICD-562.10) 15)  Colonic Polyps  (ICD-211.3) 16)  Prostate Cancer  (ICD-185) 17)  Degenerative Joint Disease  (ICD-715.90) 18)  Actinic Skin Damage  (ICD-692.70)  Current Medications (verified): 1)  Advair Diskus 250-50 Mcg/dose Aepb (Fluticasone-Salmeterol) .[name].. 1 Inhalation Two Times A Day... 2)  Spiriva Handihaler 18 Mcg Caps (Tiotropium Bromide Monohydrate) .... Inhale Contents of One Capsule By  Handihaler Daily 3)  Warfarin Sodium 2.5 Mg Tabs (Warfarin Sodium) .... As Directed  By [facility] 4)  Atenolol 50 Mg  Tabs (Atenolol) .... Take 1 Tab By Mouth Once Daily.Marland KitchenMarland Kitchen 5)  Losartan Potassium 100 Mg Tabs (Losartan Potassium) .... Take 1 Tab By Mouth Once Daily.Marland KitchenMarland Kitchen 6)  Lasix 20 Mg Tabs (Furosemide) .... Take 2 Tabs By Mouth Each Am... 7)  Klor-Con 10 10 Meq Cr-Tabs (Potassium Chloride) .... Take 1 Tablet By Mouth Once A Day 8)  Lipitor 10 Mg  Tabs (Atorvastatin Calcium) .... Take 1 Tab By Mouth Once Daily.Marland KitchenMarland Kitchen 9)  Multivitamins  Tabs (Multiple Vitamin) .... Take 1 Tablet By Mouth Once A Day 10)  Mucinex 600 Mg Xr12h-Tab (Guaifenesin) .... Take 2 Tabs By Mouth Two Times A Day W/ Plenty of Fluids... 11)  Augmentin 875-125 Mg Tabs (Amoxicillin-Pot Clavulanate) .... Take 1 Tablet By Mouth Two Times A Day X10days 12)  Aspirin 81 Mg Tbec (Aspirin) .... Take One Tablet By Mouth Daily  Allergies (verified): No Known Drug Allergies  Past History:  Past Medical History: Last updated: 05/04/2010 COPD (ICD-496) PNEUMONIA, ORGANISM UNSPECIFIED (ICD-486) PULMONARY NODULE (ICD-518.89) HYPERTENSION (ICD-401.9) CONGESTIVE HEART FAILURE (ICD-428.0) ATRIAL FIBRILLATION (ICD-427.31) COUMADIN THERAPY (ICD-V58.61) PREMATURE VENTRICULAR CONTRACTIONS, FREQUENT (ICD-427.69) PERIPHERAL VASCULAR DISEASE (ICD-443.9) THORACIC AORTIC ANEURYSM (ICD-441.2) HYPERCHOLESTEROLEMIA (ICD-272.0) DIVERTICULOSIS OF COLON (ICD-562.10) COLONIC POLYPS (ICD-211.3) PROSTATE CANCER (ICD-185) DEGENERATIVE JOINT DISEASE (ICD-715.90) ACTINIC SKIN DAMAGE (ICD-692.70)  Past Surgical History: Last updated: 05/04/2010 S/P bilat TKR's S/P Abdominal  Aortic Aneurysm Repair w/ Ao Bi-iliac graft in 1998 S/P appendectomy & left inguinal hernia repair  Family History: Last updated: May 16, 2010 mother died at age 8 w/ senile dementia father died at age 71 from a stroke 6 siblings: 2 Bothers died- one age 27 w/ DM, vasc  dis, heart dis; one age 18 w/ obesity & heart problems... 4 Sisters died- alzheimer's, heart dis, cancer ("in her back" ?myeloma)...  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   Vital Signs:  Patient profile:   77 year old male Height:      69 inches Weight:      192.50 pounds BMI:     28.53 Pulse rate:   76 / minute Pulse rhythm:   irregular Resp:     18 per minute BP sitting:   114 / 64  (left arm) Cuff size:   large  Vitals Entered By: Vikki Ports (May 27, 2010 11:47 AM)  Physical Exam  General:  Affect appropriate Healthy:  appears stated age HEENT: normal Neck supple with no adenopathy JVP normal no bruits no thyromegaly Lungs rhonchi with no wheezing and good diaphragmatic motion Heart:  S1/S2 no murmur,rub, gallop or click PMI normal Abdomen: benighn, BS positve, no tenderness, no AAA no bruit.  No HSM or HJR Distal pulses intact with no bruits No edema Neuro non-focal Skin warm and dry    Impression & Recommendations:  Problem # 1:  DYSPNEA (ICD-786.05) F/U pulmonry continue Augmentin His updated medication list for this problem includes:    Atenolol 50 Mg Tabs (Atenolol) .[name]... Take 1 tab by mouth once daily...    Losartan Potassium 100 Mg Tabs (Losartan potassium) .[name]... Take 1 tab by mouth once daily...    Lasix 20 Mg Tabs (Furosemide) .[name]... Take 2 tabs by mouth each am...    Aspirin 81 Mg Tbec (Aspirin) .[name]... Take one tablet by mouth daily  Problem # 2:  HYPERTENSION (ICD-401.9) Well contorlled His updated medication list for this problem includes:    Atenolol 50 Mg Tabs (Atenolol) .[name]... Take  1 tab by mouth once daily...    Losartan Potassium 100 Mg Tabs (Losartan potassium) .[name]... Take 1 tab by mouth once daily...    Lasix 20 Mg Tabs (Furosemide) .[name]... Take 2 tabs by mouth each am...    Aspirin 81 Mg Tbec (Aspirin) .[name]... Take one tablet by mouth daily  Problem # 3:  ATRIAL FIBRILLATION (ICD-427.31) S/P DCC continue coumadin  Check today since he is on antibiotics.  INR 2.8 on Monday His updated medication list for this problem includes:    Warfarin Sodium 2.5 Mg Tabs (Warfarin sodium) .[name]... As directed  by the coumadin clinic    Atenolol 50 Mg Tabs (Atenolol) .[name]... Take 1 tab by mouth once daily...    Aspirin 81 Mg Tbec (Aspirin) .[name]... Take one tablet by mouth daily  Problem # 4:  PERIPHERAL VASCULAR DISEASE (ICD-443.9) Stable with no palp mass and ambulating F/U CT surveilance per Dr Hart Rochester

## 2010-10-25 NOTE — Medication Information (Signed)
Summary: ccn/ gd  Anticoagulant Therapy  Managed by: Shelby Dubin, PharmD, BCPS, CPP Referring MD: [name] MD, Lavera Guise MD: Shirlee Latch MD, Otis Burress Indication 1: Atrial Fibrillation Lab Used: [facility] [place] Site: [address] INR POC 12.6  Dietary changes: no    Health status changes: no    Bleeding/hemorrhagic complications: yes       Details: Scant amt on pillow this morning  Recent/future hospitalizations: no    Any changes in medication regimen? no    Recent/future dental: no  Any missed doses?: no       Is patient compliant with meds? yes      Comments: New Pat. seen today for 1st cvrr visit. Hx: Pulm HTN, CHF, CAD, Afib, Lt. Atrial Appendage, Prostate CA, hyperlipidemia, ThoracoAbdominal Aneurysm, Premature Vent. contractions.  States he was educated in hospital, but I still then new pt. education again.   Allergies: No Known Drug Allergies  Anticoagulation Management History:      The patient comes in today for his initial visit for anticoagulation therapy.  Positive risk factors for bleeding include an age of 45 years or older.  The bleeding index is 'intermediate risk'.  Positive CHADS2 values include History of HTN and Age > 25 years old.  Anticoagulation responsible provider: Shirlee Latch MD, Helana Macbride.  INR POC: 12.6.  Cuvette Lot#: [id number].  Exp: 12/2010.    Anticoagulation Management Assessment/Plan:      The next INR is due 11/11/2009.  Anticoagulation instructions were given to patient.  Results were reviewed/authorized by Shelby Dubin, PharmD, BCPS, CPP.  He was notified by Shelby Dubin PharmD, BCPS, CPP.         Current Anticoagulation Instructions: INR 8.0 sent to lab. D/c on 11/05/09 alt. 5mg s w/2.5mg s. Took 5mg s, 2.5mg s, and 5mg s. None today. He's aware to hold. Bethena Midget, RN, BSN  November 08, 2009 3:47 PM  Received lab call at 550 pm 11/08/2009.  Called pt at 552 pm 11/08/2009.  No bleeding noted.  Patient verbalizes understanding that if he  notes bleeding, he must report to the ER / seek emergency help immediately.  He agrees to hold warfarin for 3 days and return to clinic for recheck on Thursday.

## 2010-10-25 NOTE — Assessment & Plan Note (Signed)
Summary: Acute NP office visit - hemoptysis, bradycardia   CC:  hemoptysis on and off x 1 week mixed with brownish mucus.  states about 3 hrs ago had coughing spell with over a tsp of blood and bright red.  History of Present Illness: 77 y/o WM  with known hx of HTN, PVD  09/13/09--here for a follow up visit... he has mult med problems- including HTN - on ATENOLOL 50mg /d, NORVASC 10mg /d, & VERAPAMIL 240mg /d... ~  NuclearStressTest 1/05 was neg- no ischemia or infarct (+diaphrag attenuation), EF=56%...and PERIPHERAL VASCULAR DISEASE (ICD-443.9) - on ASA 81mg /d... he is s/p AAA repair and Ao-bifem bypass in 1998 by DrLawson... also hasa known desc thor AA measuring 4.9cm and followed by DrGearhardt... last CTChest = 10/08 no change... and has been stable over the last 6 months, and he has just completed his 40th and last XRT treatmernt for his prostate cancer..  October 13, 2009 --Presents for increased SOB, wheezing, prod cough with clear/yellow mucus x1week. OTC not helping. Denies chest pain, orthopnea, hemoptysis, fever, n/v/d, edema, headache,recent travel or antibiotics.  Wears out easily. Congestion is thick at times.  Symptoms worse last 2 days. Got dyspneic taking trash out this am.   October 25, 2009--Returns for persistent symptoms of dyspnea. Last visit with Bronchitic changes- tx w/ Augmentin. Cough and congestion is better. But dyspnea is not. Over the last 2 months, he has had a gradual decline. Prior to holidays he walked  1 mile daily without stopping. Now he can barely walk 100 ft without stopping d/t dyspnea. In office today, he had some desaturations but only down to 89% on room air. Does wear out easily. No dyspnea at rest. No associated chest pain. Dyspnea is worse with actiivty and during night. Wakes him up in middle of night short of breath. He has not noticed increased leg swelling. Last echo showed EF 45-50% w/ no wall motion defects , mild dilated LV in 6/09. No new meds ,  recent travel or calf pain. Denies chest pain,  hemoptysis, fever, n/v/d, edema, headache.    Feb 03, 2010--Returns for post hospital follow up. He was admitted to hospital 2 weeks ago for RLL Pneumonia, tx as HCAP (recent hospitalization for cardioversion for afib). Tx w/ IV abx, discharged on additional Avelox. He is feeling better but still weak. has some congestion, mainly clear mucus. Denies chest pain, dyspnea, orthopnea, hemoptysis, fever, n/v/d, edema, headache.    March 10, 2010--Presents for acute work in visit. Complains that he has coughed up blood on/off for 1 week. Seen last month for post hospital for PNA. Xray showed residual RLL infiltrate. Pt is on coumadin, today his INR was supratherapeutic at 5.8. He says he feels fines and regaining his strength. Mucus is mixed w/ clear/brown/blood tinged mucus.  Denies chest pain, dyspnea, orthopnea,  fever, n/v/d, edema, headache, bloody stools, or hematuria. Cough is getting better but not gone. Herby Abraham shows persistent RLL aspdz.    Medications Prior to Update: 1)  Advair Diskus 250-50 Mcg/dose Aepb (Fluticasone-Salmeterol) .[name].. 1 Inhalation Two Times A Day... 2)  Spiriva Handihaler 18 Mcg Caps (Tiotropium Bromide Monohydrate) .... Inhale Contents of One Capsule By Handihaler Daily 3)  Warfarin Sodium 2.5 Mg Tabs (Warfarin Sodium) .... Take As Directed 4)  Adult Aspirin Ec Low Strength 81 Mg  Tbec (Aspirin) .... Once Daily 5)  Atenolol 50 Mg  Tabs (Atenolol) .... Take 1 Tab By Mouth Once Daily.Marland KitchenMarland Kitchen 6)  Losartan Potassium 100 Mg Tabs (Losartan Potassium) .Marland KitchenMarland KitchenMarland Kitchen  Take 1 Tab By Mouth Once Daily.Marland KitchenMarland Kitchen 7)  Lasix 20 Mg Tabs (Furosemide) .... Take 2 Tabs By Mouth Each Am... 8)  Klor-Con 10 10 Meq Cr-Tabs (Potassium Chloride) .... Take 1 Tablet By Mouth Once A Day 9)  Lipitor 10 Mg  Tabs (Atorvastatin Calcium) .... Take 1 Tab By Mouth Once Daily... 10)  Multivitamins  Tabs (Multiple Vitamin) .... Take 1 Tablet By Mouth Once A Day 11)  Mucinex D 60-600 Mg  Xr12h-Tab (Pseudoephedrine-Guaifenesin) .... Take 1-2 Tablets Every 12 Hours As Needed  Current Medications (verified): 1)  Advair Diskus 250-50 Mcg/dose Aepb (Fluticasone-Salmeterol) .[name].. 1 Inhalation Two Times A Day... 2)  Spiriva Handihaler 18 Mcg Caps (Tiotropium Bromide Monohydrate) .... Inhale Contents of One Capsule By Handihaler Daily 3)  Warfarin Sodium 2.5 Mg Tabs (Warfarin Sodium) .... Take As Directed 4)  Adult Aspirin Ec Low Strength 81 Mg  Tbec (Aspirin) .... Once Daily 5)  Atenolol 50 Mg  Tabs (Atenolol) .... Take 1 Tab By Mouth Once Daily.Marland KitchenMarland Kitchen 6)  Losartan Potassium 100 Mg Tabs (Losartan Potassium) .... Take 1 Tab By Mouth Once Daily.Marland KitchenMarland Kitchen 7)  Lasix 20 Mg Tabs (Furosemide) .... Take 2 Tabs By Mouth Each Am... 8)  Klor-Con 10 10 Meq Cr-Tabs (Potassium Chloride) .... Take 1 Tablet By Mouth Once A Day 9)  Lipitor 10 Mg  Tabs (Atorvastatin Calcium) .... Take 1 Tab By Mouth Once Daily... 10)  Multivitamins  Tabs (Multiple Vitamin) .... Take 1 Tablet By Mouth Once A Day 11)  Mucinex D 60-600 Mg Xr12h-Tab (Pseudoephedrine-Guaifenesin) .... Take 1-2 Tablets Every 12 Hours As Needed  Allergies (verified): No Known Drug Allergies  Past History:  Family History: Last updated: 03-21-2009 mother died at age 61 w/ senile dementia father died at age 41 from a stroke 6 siblings: 2 Bothers died- one age 66 w/ DM, vasc dis, heart dis; one age 35 w/ obesity & heart problems... 4 Sisters died- alzheimer's, heart dis, cancer ("in her back" ?myeloma)...  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 .. on ADVAIR 250Bid & SPIRIVA daily...  ~  baseline CXR & CTChest w/  marked emphysema, atheromatous calcif, 5cm desc thorAA...   ~  CXR & CTA 2/11 in hosp showed cardiomeg + coronary calcif, advanced atherosclerosis of Ao w/ aneuryms 5.8cm into abd, underlying emphysema w/ interst edema & bilat effusions, no PE...  HYPERTENSION (ICD-401.9)                               **followed by Walker Kehr for Cards... CONGESTIVE HEART FAILURE (ICD-428.0) ATRIAL FIBRILLATION (ICD-427.31) w/ clot in left atrial appendage... PREMATURE VENTRICULAR CONTRACTIONS, FREQUENT (ICD-427.69) - on ATENOLOL 50mg /d, LOSARTAN 100mg /d, & LASIx 20mg Bid + KCl 24mEq/d (prev Norvasc discontinued)...   ~  NuclearStressTest 1/05 was neg- no ischemia or infarct (+diaphrag attenuation), EF=56%...  ~  repeat Nuclear study 6/09 was neg- no ischemia, mild inferoapic thinning, not gated due to PVCs...  ~  2DEcho 6/09 showed mild dilated LV w/ EF= 45-50% but no regional wall motion abn, mild AoV calcif & AI, LA mild dil...  ~  12/10:  changed from Atacand to LOSARTAN to save $$  ~  2/11:  in hosp- Norvasc stopped & Lasix started...  PERIPHERAL VASCULAR DISEASE (ICD-443.9) - on ASA 81mg /d... he is s/p infrarenal AAA repair w/ right common iliac aneurysm repair (via Ao Bi-iliac graft) in 1998 by DrLawson... also has a known desc thor AA measuring  ~5+cm and followed by DrGearhardt every 18 months...   ~  seen by DrGearhart 4/10 w/ CT scan showing  ~5cm thoracoabdominal aneurysm w/o signif change...   ~  seen by DrGearhart 2/11 in hosp w/ sl incr size of AAA- f/u planned 71mo.  HYPERCHOLESTEROLEMIA (ICD-272.0) - on LIPITOR 10mg /d...     Past Surgical History: PMH CONTINUED ---------------------------------------------------------------------- DIVERTICULOSIS OF COLON (ICD-562.10) & COLONIC POLYPS (ICD-211.3) - hx polyps in 2003 = tubular adenoma...  ~  last colonoscopy 9/06 showed divertics, hems, no polyps.  PROSTATE CANCER (ICD-185) - eval by DrWrenn and they decided on XRT by DrKinard- finished 5/09 and  he states that DrKinard "released me"... saw DrWrenn 11/09 and was doing well- f/u planned Q20mo...  ~  5/10: f/u by DrWrenn doing well... PSA= 0.30  ~  11/10:  f/u DrWrenn w/ PSA= 0.04  DEGENERATIVE JOINT DISEASE (ICD-715.90) - s/p bilat TKR's, [facility]- DrOlin...  ACTINIC SKIN DAMAGE (ICD-692.70) - followed by Elnora Morrison, he knows to avoid sun exposure, use sun screen, etc...   SURGICAL HX BEGINS-----------------------------------  S/P bilat TKR's S/P Abdominal Aortic Aneurysm Repair w/ Ao Bi-iliac graft in 1998 S/P appendectomy & left inguinal hernia repair  Review of Systems      See HPI  Vital Signs:  Patient profile:   77 year old male Height:      69 inches Weight:      191 pounds BMI:     28.31 O2 Sat:      95 % on Room air Temp:     98.2 degrees F oral Pulse rate:   41 / minute BP sitting:   96 / 58  (left arm) Cuff size:   regular  Vitals Entered By: Boone Master CNA/MA (March 10, 2010 3:36 PM)  O2 Flow:  Room air CC: hemoptysis on and off x 1 week mixed with brownish mucus.  states about 3 hrs ago had coughing spell with over a tsp of blood and bright red Is Patient Diabetic? No Comments Medications reviewed with patient Daytime contact number verified with patient. Boone Master CNA/MA  March 10, 2010 3:36 PM    Physical Exam  Additional Exam:  WD, WN, 77 y/o WM in NAD... GENERAL:  Alert & oriented; pleasant & cooperative... HEENT:  [place]/AT, EOM-wnl, PERRLA, EACs-clear, TMs-wnl, NOSE-clear, THROAT-clear & wnl. NECK:  Supple w/ fairROM; no JVD; normal carotid impulses w/o bruits; no thyromegaly or nodules palpated; no lymphadenopathy. CHEST:  decr BS bilat, clear to P & A; without wheezes/ rales/ or rhonchi. HEART:  regular rhythm, etopic beats noted. , gr1/6 SEM without rubs or gallops heard... ABDOMEN:  Soft & nontender; normal bowel sounds; no organomegaly or masses detected. EXT: s/p bilat TKR's, mod arthritic changes; no varicose veins/ +venous insuffic/  no edema. NEURO:  CN's intact;  gait abn; no focal neuro deficits... DERM:  No lesions noted; no rash etc...        Impression & Recommendations:  Problem # 1:  PNEUMONIA, ORGANISM UNSPECIFIED (ICD-486) Persistent RLL aspdz now w/ hemoptysis. His hemoptysis suspect is due to supratherpeutic coumdain.  will hold coumadin for few days then repeat.  He had recent PNA early this year with improvement w/ CT chest at that time  w/ no mention of nodule/mass.  follow up cxr showed impromvent in bilateral aspdz w/ residual atx . Then he was hospitlaized again at end of April  w/ New PNA in RLL. xray cont w /minimal improvment. Will go ahead and rescan w/ CT chest.  and follow up . He does have hx of smoking. Also if CT is ok, may need to consider possible aspiration ? for recurrent PNA.   Orders: Radiology Referral (Radiology) TLB-PT (Protime) (85610-PTP) TLB-CBC Platelet - w/Differential (85025-CBCD) TLB-BMP (Basic Metabolic Panel-BMET) (80048-METABOL) T-2 View CXR (71020TC) Est. Patient Level V (96295)  Problem # 2:  COUMADIN THERAPY (ICD-V58.61) Supratherapeutic on coumadin, now w/ hemoptysis case discussed w/ Dr. Kriste Basque ,  pt to hold coumadin and asa until seen back in office on 6/21  will have repeat on 6/21 w/ Dr. Kriste Basque  Orders: TLB-PT (Protime) (85610-PTP) Est. Patient Level V (28413)  Problem # 3:  ATRIAL FIBRILLATION (ICD-427.31)  Hx of A. fib now in sinus rhythm.  EKG shows bigemny w/ HR of 54. He is on beta blocker. will cont to montor and follow up w/ cards.  reviewed EKG w/ Dr. Kriste Basque  His updated medication list for this problem includes:    Warfarin Sodium 2.5 Mg Tabs (Warfarin sodium) .[name]... Take as directed    Adult Aspirin Ec Low Strength 81 Mg Tbec (Aspirin) ..... Once daily    Atenolol 50 Mg Tabs (Atenolol) .[name]... Take 1 tab by mouth once daily...  Orders: Est. Patient Level V (24401)  Complete Medication List: 1)  Advair Diskus 250-50 Mcg/dose Aepb  (Fluticasone-salmeterol) .[name].. 1 inhalation two times a day... 2)  Spiriva Handihaler 18 Mcg Caps (Tiotropium bromide monohydrate) .... Inhale contents of one capsule by handihaler daily 3)  Warfarin Sodium 2.5 Mg Tabs (Warfarin sodium) .... Take as directed 4)  Adult Aspirin Ec Low Strength 81 Mg Tbec (Aspirin) .... Once daily 5)  Atenolol 50 Mg Tabs (Atenolol) .... Take 1 tab by mouth once daily.Marland KitchenMarland Kitchen 6)  Losartan Potassium 100 Mg Tabs (Losartan potassium) .... Take 1 tab by mouth once daily.Marland KitchenMarland Kitchen 7)  Lasix 20 Mg Tabs (Furosemide) .... Take 2 tabs by mouth each am... 8)  Klor-con 10 10 Meq Cr-tabs (Potassium chloride) .... Take 1 tablet by mouth once a day 9)  Lipitor 10 Mg Tabs (Atorvastatin calcium) .... Take 1 tab by mouth once daily... 10)  Multivitamins Tabs (Multiple vitamin) .... Take 1 tablet by mouth once a day 11)  Mucinex D 60-600 Mg Xr12h-tab (Pseudoephedrine-guaifenesin) .... Take 1-2 tablets every 12 hours as needed  Patient Instructions: 1)  Hold coumadin and aspirin until seen back in office 03/15/10 for physical w/ Dr. Kriste Basque  2)  We are setting you up for a CT scan of your lungs.  We will discuss results at follow up next week.  3)  I bleeding increases or worsens contact us immediately or go to ER.  4)  Please contact office for sooner follow up if symptoms do not improve or worsen    Card[employer]Perfect ECG  ID: [id number] Patient: TEX, CONROY DOB: [birth year] Age: 77 Years Old Sex: Male Race: White Physician: Rubye Oaks NP Technician: Boone Master CNA/MA Height: 69 Weight: 191 Status: Unconfirmed Past Medical History:   COPD (ICD-496) HYPERTENSION (ICD-401.9) CONGESTIVE HEART FAILURE (ICD-428.0) ATRIAL FIBRILLATION (ICD-427.31) PREMATURE VENTRICULAR CONTRACTIONS, FREQUENT (ICD-427.69) PERIPHERAL VASCULAR DISEASE (ICD-443.9) THORACIC AORTIC ANEURYSM (ICD-441.2) HYPERCHOLESTEROLEMIA (ICD-272.0) DIVERTICULOSIS OF COLON (ICD-562.10) COLONIC POLYPS  (ICD-211.3) PROSTATE CANCER (ICD-185) DEGENERATIVE JOINT DISEASE (ICD-715.90) ACTINIC SKIN DAMAGE (  ICD-692.70)  Recorded: 03/10/2010 3:53 PM P/PR: 152 ms / 193 ms - Heart rate (maximum exercise) QRS: 94 QT/QTc/QTd: 430 ms / 420 ms / 87 ms - Heart rate (maximum exercise)  P/QRS/T axis: 62 deg / -27 deg / 31 deg - Heart rate (maximum exercise)  Heartrate: 54 bpm  Interpretation:   sinus rhythm  premature ventricular complexes  intra-atrial conduction delay  horizontal axis  minimal left-precordial repolarization disturbance, probably aspecific change   flat or low negative T in V5 V6   Borderline ECG

## 2010-10-25 NOTE — Progress Notes (Signed)
Summary: hemoptysis - augmentin rx   Phone Note Call from Patient   Caller: Patient Call For: nadel Summary of Call: coughing up blood with mucus [facility] [address] Initial call taken by: Rickard Patience,  May 25, 2010 10:01 AM  Follow-up for Phone Call        called spoke with patient who c/o hemoptysis 3-4 times a day, bright red mixed with yellow mucus x2days.  denies wheezing, SOB, f/c/s.    patient is on coumadin, takes it at 4pm every day.  had INR checked monday and it was 2.8.  TP is out of office today, SN has no openings.  Additional Follow-up for Phone Call Additional follow up Details #1::        per SN----if not pcn allergy---give augmemtin 875mg   #20  1 by mouth two times a day until gone. thanks Randell Loop CMA  May 25, 2010 11:23 AM   called spoke with patient, advised of SN's recs as stated above.  pt verbalized his understanding.  pt aware that if his symptoms do not improve or worsen to call or go to the ER.  rx sent to pt's verified pharmacy. Additional Follow-up by: Boone Master CNA/MA,  May 25, 2010 11:33 AM    New/Updated Medications: AUGMENTIN 875-125 MG TABS (AMOXICILLIN-POT CLAVULANATE) Take 1 tablet by mouth two times a day x10days Prescriptions: AUGMENTIN 875-125 MG TABS (AMOXICILLIN-POT CLAVULANATE) Take 1 tablet by mouth two times a day x10days  #20 x 0   Entered by:   Boone Master CNA/MA   Authorized by:   Michele Mcalpine MD   Signed by:   Boone Master CNA/MA on 05/25/2010   Method used:   Electronically to        [facility]  [address] [id number]* (retail)       [address]       [place], [place]  [address]       Ph: [phone] or [phone]       Fax: [phone]   RxID:   [id number]

## 2010-10-25 NOTE — Letter (Signed)
Summary: [facility]   Imported By: Jerrye Noble D'jimraou 03/10/2008 13:46:13  _____________________________________________________________________  External Attachment:    Type:   Image     Comment:   External Document

## 2010-10-25 NOTE — Letter (Signed)
[facility]   Imported By: Bubba Hales 03/09/2009 09:38:05  _____________________________________________________________________  External Attachment:    Type:   Image     Comment:   External Document

## 2010-10-25 NOTE — Letter (Signed)
Summary: [facility]   Imported By: Phillis Knack 08/23/2009 08:05:01  _____________________________________________________________________  External Attachment:    Type:   Image     Comment:   External Document

## 2010-10-25 NOTE — Assessment & Plan Note (Signed)
Summary: Hospital follow up   CC:  post hospital. .  History of Present Illness: 77 y/o WM  with known hx of HTN, PVD  09/13/09--here for a follow up visit... he has mult med problems- including HTN - on ATENOLOL 50mg /d, NORVASC 10mg /d, & VERAPAMIL 240mg /d... ~  NuclearStressTest 1/05 was neg- no ischemia or infarct (+diaphrag attenuation), EF=56%...and PERIPHERAL VASCULAR DISEASE (ICD-443.9) - on ASA 81mg /d... he is s/p AAA repair and Ao-bifem bypass in 1998 by DrLawson... also hasa known desc thor AA measuring 4.9cm and followed by DrGearhardt... last CTChest = 10/08 no change... and has been stable over the last 6 months, and he has just completed his 40th and last XRT treatmernt for his prostate cancer..  October 13, 2009 --Presents for increased SOB, wheezing, prod cough with clear/yellow mucus x1week. OTC not helping. Denies chest pain, orthopnea, hemoptysis, fever, n/v/d, edema, headache,recent travel or antibiotics.  Wears out easily. Congestion is thick at times.  Symptoms worse last 2 days. Got dyspneic taking trash out this am.   October 25, 2009--Returns for persistent symptoms of dyspnea. Last visit with Bronchitic changes- tx w/ Augmentin. Cough and congestion is better. But dyspnea is not. Over the last 2 months, he has had a gradual decline. Prior to holidays he walked  1 mile daily without stopping. Now he can barely walk 100 ft without stopping d/t dyspnea. In office today, he had some desaturations but only down to 89% on room air. Does wear out easily. No dyspnea at rest. No associated chest pain. Dyspnea is worse with actiivty and during night. Wakes him up in middle of night short of breath. He has not noticed increased leg swelling. Last echo showed EF 45-50% w/ no wall motion defects , mild dilated LV in 6/09. No new meds , recent travel or calf pain. Denies chest pain,  hemoptysis, fever, n/v/d, edema, headache.   Feb 03, 2010--Returns for post hospital follow up. He was  admitted to hospital 2 weeks ago for RLL Pneumonia, tx as HCAP (recent hospitalization for cardioversion for afib). Tx w/ IV abx, discharged on additional Avelox. He is feeling better but still weak. has some congestion, mainly clear mucus. Denies chest pain, dyspnea, orthopnea, hemoptysis, fever, n/v/d, edema, headache.    Medications Prior to Update: 1)  Advair Diskus 250-50 Mcg/dose Aepb (Fluticasone-Salmeterol) .[name].. 1 Inhalation Two Times A Day... 2)  Spiriva Handihaler 18 Mcg Caps (Tiotropium Bromide Monohydrate) .... Inhale Contents of One Capsule By Handihaler Daily 3)  Warfarin Sodium 2.5 Mg Tabs (Warfarin Sodium) .... Take As Directed 4)  Adult Aspirin Ec Low Strength 81 Mg  Tbec (Aspirin) .... Once Daily 5)  Atenolol 50 Mg  Tabs (Atenolol) .... Take 1 Tab By Mouth Once Daily.Marland KitchenMarland Kitchen 6)  Losartan Potassium 100 Mg Tabs (Losartan Potassium) .... Take 1 Tab By Mouth Once Daily.Marland KitchenMarland Kitchen 7)  Lasix 20 Mg Tabs (Furosemide) .... Take 2 Tabs By Mouth Each Am... 8)  Klor-Con 10 10 Meq Cr-Tabs (Potassium Chloride) .... Take 1 Tablet By Mouth Once A Day 9)  Lipitor 10 Mg  Tabs (Atorvastatin Calcium) .... Take 1 Tab By Mouth Once Daily... 10)  Multivitamins  Tabs (Multiple Vitamin) .... Take 1 Tablet By Mouth Once A Day  Current Medications (verified): 1)  Advair Diskus 250-50 Mcg/dose Aepb (Fluticasone-Salmeterol) .[name].. 1 Inhalation Two Times A Day... 2)  Spiriva Handihaler 18 Mcg Caps (Tiotropium Bromide Monohydrate) .... Inhale Contents of One Capsule By Handihaler Daily 3)  Warfarin Sodium 2.5 Mg Tabs (Warfarin Sodium) .Marland KitchenMarland KitchenMarland Kitchen  Take As Directed 4)  Adult Aspirin Ec Low Strength 81 Mg  Tbec (Aspirin) .... Once Daily 5)  Atenolol 50 Mg  Tabs (Atenolol) .... Take 1 Tab By Mouth Once Daily.Marland KitchenMarland Kitchen 6)  Losartan Potassium 100 Mg Tabs (Losartan Potassium) .... Take 1 Tab By Mouth Once Daily.Marland KitchenMarland Kitchen 7)  Lasix 20 Mg Tabs (Furosemide) .... Take 2 Tabs By Mouth Each Am... 8)  Klor-Con 10 10 Meq Cr-Tabs (Potassium Chloride) ....  Take 1 Tablet By Mouth Once A Day 9)  Lipitor 10 Mg  Tabs (Atorvastatin Calcium) .... Take 1 Tab By Mouth Once Daily... 10)  Multivitamins  Tabs (Multiple Vitamin) .... Take 1 Tablet By Mouth Once A Day 11)  Mucinex D 60-600 Mg Xr12h-Tab (Pseudoephedrine-Guaifenesin) .... Take 1-2 Tablets Every 12 Hours As Needed  Allergies (verified): No Known Drug Allergies  Past History:  Past Medical History: Last updated: 11/16/2009  COPD (ICD-496) HYPERTENSION (ICD-401.9) CONGESTIVE HEART FAILURE (ICD-428.0) ATRIAL FIBRILLATION (ICD-427.31) PREMATURE VENTRICULAR CONTRACTIONS, FREQUENT (ICD-427.69) PERIPHERAL VASCULAR DISEASE (ICD-443.9) THORACIC AORTIC ANEURYSM (ICD-441.2) HYPERCHOLESTEROLEMIA (ICD-272.0) DIVERTICULOSIS OF COLON (ICD-562.10) COLONIC POLYPS (ICD-211.3) PROSTATE CANCER (ICD-185) DEGENERATIVE JOINT DISEASE (ICD-715.90) ACTINIC SKIN DAMAGE (ICD-692.70)  Past Surgical History: Last updated: 11/16/2009 S/P bilat TKR's S/P Abdominal Aortic Aneurysm Repair w/ Ao Bi-iliac graft in 1998 S/P appendectomy & left inguinal hernia repair  Family History: Last updated: 04/07/2009 mother died at age 44 w/ senile dementia father died at age 47 from a stroke 6 siblings: 2 Bothers died- one age 43 w/ DM, vasc dis, heart dis; one age 60 w/ obesity & heart problems... 4 Sisters died- alzheimer's, heart dis, cancer ("in her back" ?myeloma)...  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  Is Patient Diabetic? No Comments Medications reviewed with patient Daytime contact number verified with patient. Boone Master CNA  Feb 03, 2010 2:58 PM    Physical Exam  Additional Exam:  WD, WN, 77 y/o WM in NAD... GENERAL:  Alert & oriented; pleasant & cooperative... HEENT:  [place]/AT, EOM-wnl, PERRLA, EACs-clear, TMs-wnl, NOSE-clear, THROAT-clear & wnl. NECK:  Supple w/ fairROM; no JVD; normal carotid impulses w/o bruits; no thyromegaly or nodules palpated; no lymphadenopathy. CHEST:  decr BS bilat, clear to P & A; without wheezes/ rales/ or rhonchi. HEART:  regular rhythm, etopic beats noted. , gr1/6 SEM without rubs or gallops heard... ABDOMEN:  Soft & nontender; normal bowel sounds; no organomegaly or masses detected. EXT: s/p bilat TKR's, mod arthritic changes; no varicose veins/ +venous insuffic/ no edema. NEURO:  CN's intact;  gait abn; no focal neuro deficits... DERM:  No lesions noted; no rash etc...        Impression & Recommendations:  Problem # 1:  PNEUMONIA, ORGANISM  UNSPECIFIED (ICD-486) Recent RLL infiltrate c/w PNA, now improving w/ abx will repeat xray today for clearance.  REC:  Continue on current regimen.  Advance activity slowly and as tolerated.  I will call with xray results.  follow up Dr. Kriste Basque in 4 weeks  Please contact office for sooner follow up if symptoms do not improve or worsen   Orders: T-2 View CXR (71020TC) Est. Patient Level IV (59563)  Medications Added to Medication List This Visit: 1)  Mucinex D 60-600 Mg Xr12h-tab (Pseudoephedrine-guaifenesin) .... Take 1-2 tablets every 12 hours as needed  Complete Medication List: 1)  Advair Diskus 250-50 Mcg/dose Aepb (Fluticasone-salmeterol) .[name].. 1 inhalation two times a day... 2)  Spiriva Handihaler 18 Mcg Caps (Tiotropium bromide monohydrate) .... Inhale contents of one  capsule by handihaler daily 3)  Warfarin Sodium 2.5 Mg Tabs (Warfarin sodium) .... Take as directed 4)  Adult Aspirin Ec Low Strength 81 Mg Tbec (Aspirin) .... Once daily 5)  Atenolol 50 Mg Tabs (Atenolol) .... Take 1 tab by mouth once daily.Marland KitchenMarland Kitchen 6)  Losartan Potassium 100 Mg Tabs (Losartan potassium) .... Take 1 tab by mouth once daily.Marland KitchenMarland Kitchen 7)  Lasix 20 Mg Tabs (Furosemide) .... Take 2 tabs by mouth each am... 8)  Klor-con 10 10 Meq Cr-tabs (Potassium chloride) .... Take 1 tablet by mouth once a day 9)  Lipitor 10 Mg Tabs (Atorvastatin calcium) .... Take 1 tab by mouth once daily... 10)  Multivitamins Tabs (Multiple vitamin) .... Take 1 tablet by mouth once a day 11)  Mucinex D 60-600 Mg Xr12h-tab (Pseudoephedrine-guaifenesin) .... Take 1-2 tablets every 12 hours as needed  Patient Instructions: 1)  Continue on current regimen.  2)  Advance activity slowly and as tolerated.  3)  I will call with xray results.  4)  follow up Dr. Kriste Basque in 4 weeks  5)  Please contact office for sooner follow up if symptoms do not improve or worsen

## 2010-10-25 NOTE — Letter (Signed)
Summary: Appointment - Missed  [facility], Main Office  [address]   [place], [place] [address]   Phone: [phone]  Fax: [phone]     November 04, 2009 MRN: [id number]   LAKSHYA MCGILLICUDDY [address] [place], [place]  [address]   Dear Mr. Everetts,  Our records indicate you missed your appointment on  11/01/2009 with Dr. [name]. It is very important that we reach you to reschedule this appointment. We look forward to participating in your health care needs. Please contact us at the number listed above at your earliest convenience to reschedule this appointment.     Sincerely,   Migdalia Dk [facility] Scheduling Team

## 2010-10-25 NOTE — Progress Notes (Signed)
Summary: CT  Phone Note Call from Patient Call back at Home Phone [phone]   Caller: Patient Call For: Atilla Zollner Reason for Call: Talk to Nurse Summary of Call: pt called and has a CT of chest schedulled w/Gerhardt on 05/26/2010 @ Dr. Abran Duke office.  Does pt still need CT done in October for SN at [facility]?  Please notify pt and PCC. Initial call taken by: Eugene Gavia,  May 09, 2010 11:20 AM  Follow-up for Phone Call        Pt has annual follow up CT chest with contrast and abdomen/pelvic with contrast. Okay to cancel SN order for CT chest with contrast 07/12/2010? Please advise. Thanks. Zackery Barefoot CMA  May 09, 2010 11:51 AM   Per Dr. Kriste Basque, he spoke with Dr. Tyrone Sage about CT Chest, Abd & Pelvis scheduled for 05/26/10. Per Dr. Tyrone Sage this is an 39 month f/u and we can cancel his CT Chest, Abd & Pelvis if we just add an Abd and Pelvis with contrast to our CT Chest in Oct. 18. Will call [facility] and schedule CT Abd & Pelvis and see if I can cancel Dr. Dennie Maizes CT's. If not, will need to contact his office to do this. Will call pt and let him know after this process is completed. Alfonso Ramus  May 10, 2010 5:03 PM   Additional Follow-up for Phone Call Additional follow up Details #1::        Spoke with Revonda Standard at Dr. Dennie Maizes office and she stated that she would cancel the CT Chest, Abd & Pelvis for 05/26/10 at [facility]. Called pt and advised him that the CT for Dr. Tyrone Sage has been cancelled and we would add CT Abd/Pelvis to his CT Chest order on 07/12/10 at [facility]. Advised pt that he would need to pick up 2 bottles of oral contrast and that he could do that on the day he came for labs. Just be sure he came up to our floor, ask for Bjorn Loser or Almyra Free to get this contrast before appt on 07/12/10. Pt verbalized understanding. Advised pt that Revonda Standard would call and r/s his appt with dr gerhardt for 07/14/10 to follow up on his issue. Alfonso Ramus  May 11, 2010 10:59 AM  Additional  Follow-up by: Alfonso Ramus,  May 11, 2010 10:59 AM     Appended Document: Orders Update    Clinical Lists Changes  Orders: Added new Referral order of Radiology Referral (Radiology) - Signed

## 2010-10-25 NOTE — Progress Notes (Signed)
  Phone Note Outgoing Call   Call placed by: Deliah Goody, RN,  August 17, 2010 10:09 AM Summary of Call: pt needing lovenox bridge prior to dental procedure. left message for pt to call. he will need to start the lovenox 5-7 days prior to the procedure Deliah Goody, RN  August 17, 2010 10:11 AM   Follow-up for Phone Call        pt returning call- [phone] Glynda Jaeger  August 17, 2010 10:17 AM  spoke with pt, he will see the coumadin clinic wednesday 08-24-10 to get started on lovenox. dr taylor's office aware of need to do procedure in 5-7 days after starting lovenox Deliah Goody, RN  August 17, 2010 11:23 AM

## 2010-10-25 NOTE — Medication Information (Signed)
Summary: ROV/CS  Anticoagulant Therapy  Managed by: Reina Fuse, PharmD Referring MD: [name] MD, Lavera Guise MD: Shirlee Latch MD, Hjalmer Iovino Indication 1: Atrial Fibrillation Lab Used: [facility] Site: [address] INR POC 3.0 INR RANGE 2.0-3.0  Dietary changes: no    Health status changes: no    Bleeding/hemorrhagic complications: no    Recent/future hospitalizations: no    Any changes in medication regimen? no    Recent/future dental: no  Any missed doses?: no       Is patient compliant with meds? yes      Comments: Upcoming dental procedure to repair tooth. Requested that pt come off Coumadin. Appt has not been scheduled yet.   Allergies: No Known Drug Allergies  Anticoagulation Management History:      The patient is taking warfarin and comes in today for a routine follow up visit.  Positive risk factors for bleeding include an age of 75 years or older.  The bleeding index is 'intermediate risk'.  Positive CHADS2 values include History of CHF, History of HTN, and Age > 75 years old.  His last INR was 1.4 ratio.  Anticoagulation responsible provider: Shirlee Latch MD, Saabir Blyth.  INR POC: 3.0.  Cuvette Lot#: [id number].  Exp: 07/2011.    Anticoagulation Management Assessment/Plan:      The patient's current anticoagulation dose is Warfarin sodium 2.5 mg tabs: as directed  by the coumadin clinic.  The target INR is 2.0-3.0.  The next INR is due 09/13/2010.  Anticoagulation instructions were given to patient.  Results were reviewed/authorized by Reina Fuse, PharmD.  He was notified by Reina Fuse PharmD.         Prior Anticoagulation Instructions: INR 1.5  Today, take 2 tablets.  Thereafter, take 1 tablet every day of the week, except 2 tablets on Friday. Return to clinic in 2-3 weeks.   Current Anticoagulation Instructions: INR 3.0  Continue taking Coumadin 1 tab (2.5 mg) on all days except for Coumadin 2 tabs (5 mg) on Fridays. Return to clinic in 4 weeks.

## 2010-10-25 NOTE — Assessment & Plan Note (Signed)
Summary: low pulse rate/td   Chief Complaint:  bradycardia.  History of Present Illness: 77 y/o WM here for a follow up visit... he has mult med problems- including HTN - on ATENOLOL '50mg'$ /d, NORVASC '10mg'$ /d, & VERAPAMIL '240mg'$ /d... ~  NuclearStressTest 1/05 was neg- no ischemia or infarct (+diaphrag attenuation), EF=56%...and PERIPHERAL VASCULAR DISEASE (ICD-443.9) - on ASA '81mg'$ /d... he is s/p AAA repair and Ao-bifem bypass in 1998 by DrLawson... also hasa known desc thor AA measuring 4.9cm and followed by DrGearhardt... last CTChest = 10/08 no change... and has been stable over the last 6 months, and he has just completed his 40th and last XRT treatmernt for his prostate cancer.Ryan Hess today for low heart rate over last 2 days Has felt tired since finishing prostate XRT. yesterday checked blood pressure and noticed pulse in low 30's. Denies chest pain, dyspnea, orthopnea, hemoptysis, fever, n/v/d, edema.          Prior Medication List:  ADULT ASPIRIN EC LOW STRENGTH 81 MG  TBEC (ASPIRIN) once daily ATENOLOL 50 MG  TABS (ATENOLOL) take 1 tab by mouth once daily... NORVASC 10 MG  TABS (AMLODIPINE BESYLATE) take 1/2 tab by mouth once daily... VERAPAMIL HCL CR 240 MG  CP24 (VERAPAMIL HCL) Take 1 tablet by mouth at bedtime LIPITOR 10 MG  TABS (ATORVASTATIN CALCIUM) take 1 tab by mouth once daily...   Current Allergies (reviewed today): No known allergies   Past Medical History:    Reviewed history from 02/12/2008 and no changes required:       COPD (ICD-496) - he is an ex-smoker, having quit in 1990 after 40 yrs of smoking... he has been exercising regularly and walking daily...        ~  baseline CXR & CTChest w/ marked emphysema, atheromatous calcif, 5cm desc thorAA...               HYPERTENSION (ICD-401.9) - on ATENOLOL '50mg'$ /d, NORVASC '10mg'$ /d, & VERAPAMIL '240mg'$ /d... BP= 120/72 and feeling well... denies HA, fatigue, visual changes, CP, palipit, dizziness, syncope, dyspnea, edema,  etc...        ~  NuclearStressTest 1/05 was neg- no ischemia or infarct (+diaphrag attenuation), EF=56%...              PERIPHERAL VASCULAR DISEASE (ICD-443.9) - on ASA '81mg'$ /d... he is s/p AAA repair and Ao-bifem bypass in 1998 by DrLawson... also hasa known desc thor AA measuring 4.9cm and followed by DrGearhardt... last CTChest = 10/08 no change...              HYPERCHOLESTEROLEMIA (ICD-272.0) - on LIPITOR '10mg'$ /d...         ~  [place] 5/07 showed TChol 115, Tg 58, HDL 36, LDL 67...              DIVERTICULOSIS OF COLON (ICD-562.10)       COLONIC POLYPS (ICD-211.3) - hx polyps in 2003 = tubular adenoma...        ~  last colonoscopy 9/06 showed divertics, hems, no polyps.              PROSTATE CANCER (ICD-185) - eval by DrWrenn and they finished XRT x 40 tx 5/09          Family History:    Reviewed history from 02/12/2008 and no changes required:       mother died at age 54       father died at age 36 from a stroke       2 siblings  1 brother died at age 53 from heart problems       1 brother died at age 26  Social History:    Reviewed history from 02/12/2008 and no changes required:       pt Administrator, Civil Service       quit smoking in 1990       pt is married to [employer]       3 children   Risk Factors: Tobacco use:  quit   Review of Systems      See HPI   Vital Signs:  Patient Profile:   77 Years Old Male Weight:      217 pounds O2 Sat:      95 % O2 treatment:    Room Air Temp:     97.1 degrees F oral Pulse rate:   68 / minute BP sitting:   126 / 68  (left arm)  Vitals Entered By: Freddrick March RN (March 17, 2008 2:34 PM)             Comments Pt is here today for bradycardia.Recently completed radiation tx for prostate cancer.  Pt sttea HR down to 30 felt very tired, fatigued.  Walks a mile everyday, unable to do yesterday d/t decr HR. Medications reviewed Freddrick March RN  March 17, 2008 2:42 PM      Physical Exam  WD, WN, 77 y/o WM in NAD... GENERAL:  Alert  & oriented; pleasant & cooperative... HEENT:  [place]/AT, EOM-wnl, PERRLA, EACs-clear, TMs-wnl, NOSE-clear, THROAT-clear & wnl. NECK:  Supple w/ fairROM; no JVD; normal carotid impulses w/o bruits; no thyromegaly or nodules palpated; no lymphadenopathy. CHEST:  decr BS bilat, clear to P & A; without wheezes/ rales/ or rhonchi. HEART:  SB;  gr1/6 SEM without rubs or gallops heard... ABDOMEN:  Soft & nontender; normal bowel sounds; no organomegaly or masses detected. EXT: s/p bilat TKR's, mod arthritic changes; no varicose veins/ +venous insuffic/ no edema. NEURO:  CN's intact;  gait abn; no focal neuro deficits... DERM:  No lesions noted; no rash etc...       Impression & Recommendations:  Problem # 1:  PREMATURE VENTRICULAR CONTRACTIONS, FREQUENT (ICD-427.69) Frequent PVC on EKG-trigemny- unclear etiology with associated symptamology:  REC: Refer to cardiology.  Avoid decongestants, decrease caffeine.  Please contact office for sooner follow up if symptoms do not improve or worsen    His updated medication list for this problem includes:    Adult Aspirin Ec Low Strength 81 Mg Tbec (Aspirin) ..... Once daily    Atenolol 50 Mg Tabs (Atenolol) .[name]... Take 1 tab by mouth once daily...  Orders: Cardiology Referral (Cardiology) Est. Patient Level III [id number])  Labs Reviewed: CL: 105 (02/12/2008)    Ca: 9.8 (02/12/2008)   TSH: 3.00 (02/12/2008)      Problem # 2:  HYPERTENSION (ICD-401.9) Controlled on rx His updated medication list for this problem includes:    Atenolol 50 Mg Tabs (Atenolol) .[name]... Take 1 tab by mouth once daily...    Norvasc 10 Mg Tabs (Amlodipine besylate) .[name]... Take 1/2 tab by mouth once daily...    Verapamil Hcl Cr 240 Mg Cp24 (Verapamil hcl) .[name]... Take 1 tablet by mouth at bedtime  BP today: 126/68 Prior BP: 120/72 (02/12/2008)  Labs Reviewed: Creat: 1.3 (02/12/2008) Chol: 126 (02/12/2008)   HDL: 37.1 (02/12/2008)   LDL: 75 (02/12/2008)   TG: 71  (02/12/2008)    Patient Instructions: 1)  follow up cardiology as scheduled.  2)  Avoid decongestants,  decrease caffeine.  3)  Please contact office for sooner follow up if symptoms do not improve or worsen    ]

## 2010-10-25 NOTE — Assessment & Plan Note (Signed)
Summary: 6 MONTH ROV/SL      Allergies Added: NKDA  CC:  no compliants pt is unsure of dosages  of meds does not no the dose of his atenolol 25 or 50.  History of Present Illness: Ryan Hess is seen today in followup for hypertension hypercholesterolemia and vascular disease.  He is status post AAA repair.  He has a residual thoracic aneurysm.  He is followed closely by Ryan Hess.  His residual aneurysm is 5 3 x 5.1.  His been stable.  I reviewed his last CT scan from 422 2010.  Is not having significant chest pain PND or orthopnea he is still active.  His wife's health is fairly poor.  They've been married 75 years.  He is due to see Ryan Hess next month to check his cholesterol and liver.  Because his residual aneurysm involves the thoracic aorta not sure he would be a stent candidate.  Otherwise he walks a regular basis.  He does all activities of daily living.  He is quite busy doing yard work including heavy gardening with his cameillia's  Current Problems (verified): 1)  COPD  (ICD-496) 2)  Hypertension  (ICD-401.9) 3)  Premature Ventricular Contractions, Frequent  (ICD-427.69) 4)  Peripheral Vascular Disease  (ICD-443.9) 5)  Hypercholesterolemia  (ICD-272.0) 6)  Diverticulosis of Colon  (ICD-562.10) 7)  Colonic Polyps  (ICD-211.3) 8)  Prostate Cancer  (ICD-185) 9)  Degenerative Joint Disease  (ICD-715.90) 10)  Actinic Skin Damage  (ICD-692.70)  Current Medications (verified): 1)  Adult Aspirin Ec Low Strength 81 Mg  Tbec (Aspirin) .... Once Daily 2)  Atenolol 50 Mg  Tabs (Atenolol) .... Take 1 Tab By Mouth Once Daily.Marland KitchenMarland Kitchen 3)  Norvasc 10 Mg  Tabs (Amlodipine Besylate) .... Take 1/2 Tab By Mouth Once Daily.Marland KitchenMarland Kitchen 4)  Atacand 8 Mg Tabs (Candesartan Cilexetil) .... Take 1 Tab By Mouth Once Daily 5)  Lipitor 10 Mg  Tabs (Atorvastatin Calcium) .... Take 1 Tab By Mouth Once Daily.Marland KitchenMarland Kitchen 6)  Multivitamins  Tabs (Multiple Vitamin) .... Take 1 Tablet By Mouth Once A Day  Allergies (verified): No  Known Drug Allergies  Past History:  Past Medical History:    COPD (ICD-496)    HYPERTENSION (ICD-401.9)    PREMATURE VENTRICULAR CONTRACTIONS, FREQUENT (ICD-427.69)    PERIPHERAL VASCULAR DISEASE (ICD-443.9)    HYPERCHOLESTEROLEMIA (ICD-272.0)    DIVERTICULOSIS OF COLON (ICD-562.10)    COLONIC POLYPS (ICD-211.3)    PROSTATE CANCER (ICD-185)    DEGENERATIVE JOINT DISEASE (ICD-715.90)    ACTINIC SKIN DAMAGE (ICD-692.70\par    Thoracic Aneurysm:    AAA Repair     (02/09/2009)  Past Surgical History:    S/P bilat TKR's...     S/P Abdominal Aortic Aneurysm Repair w/ Ao Bi-iliac graft in 1998    S/P appendectomy & left inguinal hernia repair     (09-17-08)  Family History:    mother died at age 61    father died at age 58 from a stroke    2 siblings:    1 brother died at age 22 from heart problems    1 brother died at age 42 (September 17, 2008)  Social History:    pt Administrator, Civil Service    quit smoking in 1990    pt is married to Ryan Hess    3 children    Alcohol Use - no    Regular Exercise - yes    Drug Use - no     (02/09/2009)  Review of Systems  Denies fever, malais, weight loss, blurry vision, decreased visual acuity, cough, sputum, SOB, hemoptysis, pleuritic pain, palpitaitons, heartburn, abdominal pain, melena, lower extremity edema, claudication, or rash. All other systems reviewed and negative  Vital Signs:  Patient profile:   77 year old male Height:      69 inches Weight:      213 pounds BMI:     31.57 Pulse rate:   70 / minute Resp:     12 per minute BP sitting:   142 / 68  (left arm)  Vitals Entered By: Ryan Hess (Feb 10, 2009 2:20 PM)  Physical Exam  General:  Affect appropriate Healthy:  appears stated age 77: normal Neck supple with no adenopathy JVP normal no bruits no thyromegaly Lungs clear with no wheezing and good diaphragmatic motion Heart:  S1/S2 no murmur,rub, gallop or click PMI normal Abdomen: benighn, BS positve, no  tenderness, no AAA repair scar no bruit.  No HSM or HJR Distal pulses intact with no bruits No edema Neuro non-focal Skin warm and dry    Impression & Recommendations:  Problem # 1:  HYPERTENSION (ICD-401.9) Well controlled continue low sodium diet His updated medication list for this problem includes:    Adult Aspirin Ec Low Strength 81 Mg Tbec (Aspirin) ..... Once daily    Atenolol 50 Mg Tabs (Atenolol) .[name]... Take 1 tab by mouth once daily...    Norvasc 10 Mg Tabs (Amlodipine besylate) .[name]... Take 1/2 tab by mouth once daily...    Atacand 8 Mg Tabs (Candesartan cilexetil) .[name]... Take 1 tab by mouth once daily  Problem # 2:  PREMATURE VENTRICULAR CONTRACTIONS, FREQUENT (ICD-427.69) Stable with no NSVT or syncope.  Continue BB His updated medication list for this problem includes:    Adult Aspirin Ec Low Strength 81 Mg Tbec (Aspirin) ..... Once daily    Atenolol 50 Mg Tabs (Atenolol) .[name]... Take 1 tab by mouth once daily...    Norvasc 10 Mg Tabs (Amlodipine besylate) .[name]... Take 1/2 tab by mouth once daily...  Problem # 3:  HYPERCHOLESTEROLEMIA (ICD-272.0) F/U lipid and liver with Ryan Hess in 4 weeks His updated medication list for this problem includes:    Lipitor 10 Mg Tabs (Atorvastatin calcium) .[name]... Take 1 tab by mouth once daily...  Problem # 4:  THORACIC AORTIC ANEURYSM (ICD-441.2) F/.U Ryan Hess.  CT recently done and reviewed.  No symptoms.  Good BP control  Patient Instructions: 1)  F/U Ryan Hess 1 year

## 2010-10-25 NOTE — Consult Note (Signed)
Summary: [facility]   Imported By: Roderic Ovens 11/15/2009 14:18:51  _____________________________________________________________________  External Attachment:    Type:   Image     Comment:   External Document

## 2010-10-25 NOTE — Medication Information (Signed)
Summary: rov/tm  Anticoagulant Therapy  Managed by: Freddrick March, RN, BSN Referring MD: Johnsie Cancel MD, Evelena Asa MD: Ron Parker MD, Dellis Filbert Indication 1: Atrial Fibrillation Lab Used: [facility] [place] Site: [place] INR POC 3.3 INR RANGE 2.0-3.0  Dietary changes: no    Health status changes: no    Bleeding/hemorrhagic complications: no    Recent/future hospitalizations: no    Any changes in medication regimen? no    Recent/future dental: no  Any missed doses?: yes     Details: Holding coumadin currently.    Is patient compliant with meds? yes      Comments: Verified pt has '5mg'$  tablets.  Pt was alternating 2 tablets/1 tablet every other day.  So pt was actually taking '10mg'$ /'5mg'$  alternating!  Allergies: No Known Drug Allergies  Anticoagulation Management History:      The patient is taking warfarin and comes in today for a routine follow up visit.  Positive risk factors for bleeding include an age of 75 years or older.  The bleeding index is 'intermediate risk'.  Positive CHADS2 values include History of HTN and Age > 67 years old.  His last INR was 12.6 ratio.  Anticoagulation responsible provider: Ron Parker MD, Dellis Filbert.  INR POC: 3.3.  Cuvette Lot#: [id number].  Exp: 12/2010.    Anticoagulation Management Assessment/Plan:      The target INR is 2.0-3.0.  The next INR is due 11/16/2009.  Anticoagulation instructions were given to patient.  Results were reviewed/authorized by Freddrick March, RN, BSN.  He was notified by Freddrick March RN.         Prior Anticoagulation Instructions: INR 8.0 sent to lab. D/c on 11/05/09 alt. '5mg'$ s w/2.'5mg'$ s. Took '5mg'$ s, 2.'5mg'$ s, and '5mg'$ s. None today. He's aware to hold. Tula Nakayama, RN, BSN  November 08, 2009 3:47 PM  Received lab call at 550 pm 11/08/2009.  Called pt at 552 pm 11/08/2009.  No bleeding noted.  Patient verbalizes understanding that if he notes bleeding, he must report to the ER / seek emergency help immediately.  He agrees  to hold warfarin for 3 days and return to clinic for recheck on Thursday.     Current Anticoagulation Instructions: INR 3.3    Start alternating 1whole tablet and 1/2 tablet every other day. Recheck on 11/16/09.

## 2010-10-25 NOTE — Medication Information (Signed)
Summary: rov/ewj  Anticoagulant Therapy  Managed by: Cloyde Reams, RN, BSN Referring MD: [name] MD, Lavera Guise MD: Daleen Squibb MD, Maisie Fus Indication 1: Atrial Fibrillation Lab Used: [facility] [place] Site: [address] INR POC 2.6 INR RANGE 2.0-3.0  Dietary changes: no    Health status changes: no    Bleeding/hemorrhagic complications: no    Recent/future hospitalizations: no    Any changes in medication regimen? no    Recent/future dental: no  Any missed doses?: no       Is patient compliant with meds? yes       Allergies (verified): No Known Drug Allergies  Anticoagulation Management History:      The patient is taking warfarin and comes in today for a routine follow up visit.  Positive risk factors for bleeding include an age of 75 years or older.  The bleeding index is 'intermediate risk'.  Positive CHADS2 values include History of HTN and Age > 75 years old.  His last INR was 12.6 ratio.  Anticoagulation responsible provider: Daleen Squibb MD, Maisie Fus.  INR POC: 2.6.  Cuvette Lot#: [id number].  Exp: 12/2010.    Anticoagulation Management Assessment/Plan:      The target INR is 2.0-3.0.  The next INR is due 11/23/2009.  Anticoagulation instructions were given to patient.  Results were reviewed/authorized by Cloyde Reams, RN, BSN.  He was notified by Cloyde Reams RN.         Prior Anticoagulation Instructions: INR 3.3    Start alternating 1whole tablet and 1/2 tablet every other day. Recheck on 11/16/09.    Current Anticoagulation Instructions: INR 2.6  Continue on same dosage alternating 1 whole tablet and 1/2 tablet every other day.  Recheck in 1 week.

## 2010-10-25 NOTE — Assessment & Plan Note (Signed)
Summary: rov/apc   Chief Complaint:  7 month ROV....  History of Present Illness: 77 y/o WM here for a follow up visit... he has multiple medical problems as noted below...  he has had several visits w/ Cherly Hensen for Cardiology in the interim w/ some medication adjustments- off Verapamil and tried on Lisinopril, but developed ACE cough, therefore switched to Atacand... he had some PVC's , mild bradycardia, & weakness- now improved but notes some intermittent dizziness that seems to be positional... we will Rx w/ Meclizine Prn.    Current Problem List:  COPD ([id number]) - he is an ex-smoker, having quit in 1990 after 40 yrs of smoking... he has been exercising regularly and walking daily...  ~  baseline CXR & CTChest w/ marked emphysema, atheromatous calcif, 5cm desc thorAA...   HYPERTENSION (ICD-401.9) - on ATENOLOL '50mg'$ /d, NORVASC '10mg'$ /d, & ATACAND '8mg'$ /d... BP= 116/82 and feeling well... denies HA, fatigue, visual changes, CP, palipit, syncope, dyspnea, edema, etc...  ~  NuclearStressTest 1/05 was neg- no ischemia or infarct (+diaphrag attenuation), EF=56%...  ~  repeat Nuclear study 6/09 was neg- no ischemia, mild inferoapic thinning, not gated due to PVCs...  ~  2DEcho 6/09 showed mild dilated LV w/ EF= 45-50% but no regional wall motion abn, mild AoV calcif & AI, LA mild dil...  PERIPHERAL VASCULAR DISEASE (ICD-443.9) - on ASA '81mg'$ /d... he is s/p infrarenal AAA repair w/ right common iliac aneurysm repair (via Ao Bi-iliac graft) in 1998 by DrLawson... also has a known desc thor AA measuring 4.9cm and followed by DrGearhardt every 18 months... last CTChest = 10/08 no change...  HYPERCHOLESTEROLEMIA (ICD-272.0) - on LIPITOR '10mg'$ /d...   ~  [place] 5/07 showed TChol 115, Tg 58, HDL 36, LDL 67...  ~  [place] 5/08 showed TChol 118, TG 66, HDL 31, LDL 74...  ~  [place] 5/09 showed TChol 126, TG 71, HDL 37, LDL 75...  DIVERTICULOSIS OF COLON (ICD-562.10) & COLONIC POLYPS (ICD-211.3) - hx polyps in 2003 =  tubular adenoma...  ~  last colonoscopy 9/06 showed divertics, hems, no polyps.  PROSTATE CANCER (ICD-185) - eval by DrWrenn and they decided on XRT by DrKinard- finished 5/09 and he states that DrKinard "released me"... saw DrWrenn 11/09 and was doing well- f/u planned Q89mo..  DEGENERATIVE JOINT DISEASE (ICD-715.90) - s/p bilat TKR's...  ACTINIC SKIN DAMAGE (ICD-692.70)      Current Allergies (reviewed today): No known allergies   Past Medical History:        COPD (ICD-496)    HYPERTENSION (ICD-401.9)    PREMATURE VENTRICULAR CONTRACTIONS, FREQUENT (ICD-427.69)    PERIPHERAL VASCULAR DISEASE (ICD-443.9)    HYPERCHOLESTEROLEMIA (ICD-272.0)    DIVERTICULOSIS OF COLON (ICD-562.10)    COLONIC POLYPS (ICD-211.3)    PROSTATE CANCER (ICD-185)    DEGENERATIVE JOINT DISEASE (ICD-715.90)    ACTINIC SKIN DAMAGE (ICD-692.70)      Past Surgical History:    S/P bilat TKR's...     S/P Abdominal Aortic Aneurysm Repair w/ Ao Bi-iliac graft in 1998    S/P appendectomy & left inguinal hernia repair   Family History:    Reviewed history from 02/12/2008 and no changes required:       mother died at age 75      father died at age 2268from a stroke       2 siblings:       1 brother died at age 228from heart problems       1 brother died at age 867  Social History:    Reviewed history from 02/12/2008 and no changes required:       pt Administrator, Civil Service       quit smoking in 1990       pt is married to peggy Hennon       3 children   Risk Factors:  Tobacco use:  quit   Review of Systems       The patient complains of dyspnea on exertion.  The patient denies anorexia, fever, weight loss, weight gain, vision loss, decreased hearing, hoarseness, chest pain, syncope, peripheral edema, prolonged cough, headaches, hemoptysis, abdominal pain, melena, hematochezia, severe indigestion/heartburn, hematuria, incontinence, muscle weakness, suspicious skin lesions, transient blindness, difficulty  walking, depression, unusual weight change, abnormal bleeding, enlarged lymph nodes, and angioedema.     Vital Signs:  Patient Profile:   77 Years Old Male Weight:      217 pounds O2 Sat:      98 % O2 treatment:    Room Air Temp:     96.9 degrees F oral Pulse rate:   62 / minute BP sitting:   116 / 82  (left arm) Cuff size:   regular  Vitals Entered By: Ramiro Harvest CMA (August 26, 2008 4:02 PM)                 Physical Exam  WD, WN, 77 y/o WM in NAD... GENERAL:  Alert & oriented; pleasant & cooperative... HEENT:  /AT, EOM-wnl, PERRLA, EACs-clear, TMs-wnl, NOSE-clear, THROAT-clear & wnl. NECK:  Supple w/ fairROM; no JVD; normal carotid impulses w/o bruits; no thyromegaly or nodules palpated; no lymphadenopathy. CHEST:  decr BS bilat, clear to P & A; without wheezes/ rales/ or rhonchi. HEART:  SB;  gr1/6 SEM without rubs or gallops heard... ABDOMEN:  Soft & nontender; normal bowel sounds; no organomegaly or masses detected. EXT: s/p bilat TKR's, mod arthritic changes; no varicose veins/ +venous insuffic/ no edema. NEURO:  CN's intact;  gait abn; no focal neuro deficits... DERM:  No lesions noted; no rash etc...        Impression & Recommendations:  Problem # 1:  COPD (ICD-496) Stable w/ mod severe underlying COPD/ Emphysema... he is very well compensated... continue exerc program.  Problem # 2:  HYPERTENSION (ICD-401.9) Meds adjusted by drNishan... he is stable- continue same. His updated medication list for this problem includes:    Atenolol 50 Mg Tabs (Atenolol) .[name]... Take 1 tab by mouth once daily...    Norvasc 10 Mg Tabs (Amlodipine besylate) .[name]... Take 1/2 tab by mouth once daily...    Atacand 8 Mg Tabs (Candesartan cilexetil) .[name]... Take 1 tab by mouth once daily   Problem # 3:  PERIPHERAL VASCULAR DISEASE (ICD-443.9) He will f/u w/ DrGearhardt in 2010... same meds.  Problem # 4:  HYPERCHOLESTEROLEMIA (ICD-272.0) Stable-  continue Lipitor. His  updated medication list for this problem includes:    Lipitor 10 Mg Tabs (Atorvastatin calcium) .[name]... Take 1 tab by mouth once daily...   Problem # 5:  PROSTATE CANCER (ICD-185) Stable-  followed Q75moby DrWrenn...  Problem # 6:  DEGENERATIVE JOINT DISEASE (ICD-715.90) Stable w/ severe DJD, s/p bilat TKRs... His updated medication list for this problem includes:    Adult Aspirin Ec Low Strength 81 Mg Tbec (Aspirin) ..... Once daily   Problem # 7:  DIZZINESS--- We will try Meclizine '25mg'$ - 1/2 to 1 tab by mouth Prn dizziness...  Complete Medication List: 1)  Adult Aspirin Ec Low Strength 81 Mg  Tbec (Aspirin) .... Once daily 2)  Atenolol 50 Mg Tabs (Atenolol) .... Take 1 tab by mouth once daily.Marland KitchenMarland Kitchen 3)  Norvasc 10 Mg Tabs (Amlodipine besylate) .... Take 1/2 tab by mouth once daily.Marland KitchenMarland Kitchen 4)  Atacand 8 Mg Tabs (Candesartan cilexetil) .... Take 1 tab by mouth once daily 5)  Lipitor 10 Mg Tabs (Atorvastatin calcium) .... Take 1 tab by mouth once daily.Marland KitchenMarland Kitchen 6)  Multivitamins Tabs (Multiple vitamin) .... Take 1 tablet by mouth once a day 7)  Meclizine Hcl 25 Mg Tabs (Meclizine hcl) .... Take 1/2 to 1 tab by mouth every 6 h as needed for dizziness...   Patient Instructions: 1)  Today we updated your med list- see below.... 2)  Continue your same medications... 3)  We wrote a new perscription for Meclizine to take 1/2 - 1 tab as needed for dizziness... 4)  Call for any problems.Marland KitchenMarland Kitchen 5)  Let's plan a follow up appt in 6 months, and we will check FASTING blood work at that time.Marland KitchenMarland Kitchen 6)  HAPPY HOLIDAYS!!!   Prescriptions: MECLIZINE HCL 25 MG TABS (MECLIZINE HCL) take 1/2 to 1 tab by mouth every 6 H as needed for dizziness...  #30 x prn   Entered and Authorized by:   Noralee Space MD   Signed by:   Noralee Space MD on 08/26/2008   Method used:   Print then Give to Patient   RxID:   [id number]  ]

## 2010-10-25 NOTE — Medication Information (Signed)
Summary: rov/tm  Anticoagulant Therapy  Managed by: Bethena Midget, RN, BSN Referring MD: [name] MD, Lavera Guise MD: Daleen Squibb MD, Maisie Fus Indication 1: Atrial Fibrillation Lab Used: [facility] [place] Site: [address] INR POC 1.9 INR RANGE 2.0-3.0  Dietary changes: no    Health status changes: no    Bleeding/hemorrhagic complications: no    Recent/future hospitalizations: no    Any changes in medication regimen? no    Recent/future dental: no  Any missed doses?: no       Is patient compliant with meds? yes      Comments: Pending TEE DCCV on 12/24/09 with Dr. [name]. Pt. saw Dr [name] today.   Allergies: No Known Drug Allergies  Anticoagulation Management History:      The patient is taking warfarin and comes in today for a routine follow up visit.  Positive risk factors for bleeding include an age of 27 years or older.  The bleeding index is 'intermediate risk'.  Positive CHADS2 values include History of CHF, History of HTN, and Age > 86 years old.  His last INR was 12.6 ratio.  Anticoagulation responsible provider: Daleen Squibb MD, Maisie Fus.  INR POC: 1.9.  Cuvette Lot#: [id number].  Exp: 01/2011.    Anticoagulation Management Assessment/Plan:      The patient's current anticoagulation dose is Warfarin sodium 2.5 mg tabs: take as directed.  The target INR is 2.0-3.0.  The next INR is due 12/23/2009.  Anticoagulation instructions were given to patient.  Results were reviewed/authorized by Bethena Midget, RN, BSN.  He was notified by Bethena Midget, RN, BSN.         Prior Anticoagulation Instructions: INR 2.3 Continue  1 pill everyday except 1/2 pill on Sundays and Fridays. Recheck in one week.   Current Anticoagulation Instructions: INR 1.9 Today take 1.5 tablets then change dose to 1 tablet everyday except 1/2 on Fridays. Recheck INR in one week.

## 2010-10-25 NOTE — Progress Notes (Signed)
Summary: talk to nurse  Phone Note Call from Patient Call back at Home Phone [phone]   Reason for Call: Talk to Nurse Summary of Call: pt checked their blood pressure and pulse this morning and their puls was kinda low it was 30. he did not want an apt to see tammy but just wanted to talk to a nurse about this.  Initial call taken by: Adin Hector,  March 16, 2008 9:13 AM  Follow-up for Phone Call        ov scheduled with tammy parrett for 6/23 at 2:45 told pt to go to er if further symptoms or if he feels someone needs to address this today Follow-up by: [place],  March 16, 2008 1:07 PM

## 2010-10-25 NOTE — Progress Notes (Signed)
Summary: pre-med before dental work  Phone Note From Other Clinic   Caller: lisa dr[facility]david taylor office [phone] Request: Talk with Nurse Summary of Call: pt has appt on 5/25 does pt need pre-med, also does pt need to come off coumadin. Initial call taken by: Lorne Skeens,  Jan 31, 2010 11:59 AM  Follow-up for Phone Call        spoke with lisa, pt does not need pre-med for dental work and he does not need to stop coumadin for general cleaning. they wanted to know if he could hold his coumadin prior to a tooth extraction. will foward to dr [name] for his review Deliah Goody, RN  Feb 01, 2010 4:19 PM   Additional Follow-up for Phone Call Additional follow up Details #1::        He was just cardioverted 12/24/09  I prefer him to not stop his coumadin for at least 8 weeks post cardioversion.  Additional Follow-up by: Colon Branch, MD, [facility],  Feb 02, 2010 9:11 AM     Appended Document: pre-med before dental work spoke with lisa, she is aware pt needs to cont coumadin for 8 weeks

## 2010-10-25 NOTE — Letter (Signed)
Summary: Cardioversion/TEE Instructions  Architectural technologist, Main Office  [address]   [place], [place] [address]   Phone: [phone]  Fax: [phone]    Cardioversion / TEE Cardioversion Instructions  You are scheduled for a Cardioversion / TEE Cardioversion on Friday, December 24, 2009 with Dr. [name].   Please arrive at the [facility] of [facility] at 10:30 a.m.  on the day of your procedure.  1)   DIET:  A)   Nothing to eat or drink after midnight except your medications with a sip of water.   2)   Come to the [place] office on December 23, 2009 for lab work. The lab at [facility] is open from 8:30 a.m. to 1:30 p.m. and 2:30 p.m. to 5:00 p.m. The lab at [facility] is open from 7:30 a.m. to 5:30 p.m. You do not have to be fasting.  3)   MAKE SURE YOU TAKE YOUR COUMADIN.  4)   A)   DO NOT TAKE these medications before your procedure:      Hold Atenolol morning of procedure.  B)   YOU MAY TAKE ALL of your remaining medications with a small amount of water.   5)  Must have a responsible person to drive you home.  6)   Bring a current list of your medications and current insurance cards.   * Special Note:  Every effort is made to have your procedure done on time. Occasionally there are emergencies that present themselves at the hospital that may cause delays. Please be patient if a delay does occur.  * If you have any questions after you get home, please call the office at [phone].

## 2010-10-25 NOTE — Assessment & Plan Note (Signed)
Summary: 2 month follow up--review ct scan/la   CC:  2 month ROV & review of recent f/u CT scan....  History of Present Illness: 77 y/o WM here for a follow up visit... he has multiple medical problems as noted below...     ~  Jun10:  23mo follow up doing well- no new complaints or concerns... he saw DrGearhart 4/10- f/u thoracoabdominal aneurysm (s/p AAA &right common iliac aneurysm repair 1998 by Windell Moulding)... 68mo CT scan showed  ~no change in the 5cm aneurysm & he rec keep BP under control & f/u in another 68mo... he also had f/u DrWrenn for Urology- f/u Prostate Cancer s/p XRT finished 5/09... doing well w/o symptoms and PSA was down to 0.30- plans f/u another 23mo... finally saw Walker Kehr for 24yr cardiac f/u doing well- no change in meds and yearly ROV is planned...   ~  November 16, 2009:  Hosp 2/4 - 11/05/09 for CP- felt to be noncardiac in origin w/ cath showing nonobstructive CAD but he had new AFib, CHF w/ EF ~45% w/ HK & TEE showing left atrial appendage clot... he was diuresed & meds adjusted, placed on Coumadin w/ careful f/u by Walker Kehr & the CC... he had PV f/u w/ CT to check his thoracoabd aneurysm= 5.6cm (min larger serially) & f/u planned 23mo...  from the pulm standpoint he had a bronchitic exac treated w/ Doxy, and reminded to take the Advair/ Spiriva regularly...  currently feeling better w/ Lasix/ KCl added & using inhalers regularly...   ~  March 15, 2010:  he was hosp 4/11 by [facility] w/ RLL Pneum- NOS, treated empirically w/ Vanc/ Zosyn, then Po Avelox & outpt follow up... RLL has not cleared & he developed some hemoptysis (on Coumadin for AFib) & CT Chest 03/11/10 showed emphysema w/ 6cm cavitary lesion RLL & a nodular component inferiorly, +right hilar adenop, no mediastinal adenop, ThorAA measures 5.8cm, etc...  he is an ex-smoker quit 1990 w/ 105yrs up to 2ppd prior... states he feels pretty good- no CP, mild cough, beige phlegm, no blood off the Coumadin x several days now... **we  discussed f/u labs (OK-reviewed), check tumor markers CEA=7.5, Ca19-9=11), contact IR re: needle bx of this lesion;  in the interim> Rx w/ Augmentin 875mg Bid.   **NOTE: IR, DrSchick, felt bx was hi risk w/ his emphysema (prob get pneumothorax, chest tube, etc) & rec holding off on bx for now.   ~  May 04, 2010:  continues to feel well- "I'm doing good"... no CP, no SOB, min cough w/ yellow sput (no change, no blood), walking for exercise... f/u CXR looks similar w/ no change in RLL opacity & we discussed f/u CEA level (6.5) and ROV 39mo w/ CT Chest at that time.   ~  July 15, 2010:  CT Chest shows interval decrease in size of RLL cavitary lesion & the inferiorly placed soft tissue component; otherw there is severe emphysema, cardiomeg, coronary calcif, thoracoabd aneurysm w/o change; there was an air-fluid level in a dilated esoph & pt notes some intermittent choking w/ liquids & "when my mouth is dry"> we decided to proceed w/ Ba Esophagram (he wants to hold on speech path swallowing eval for now)... he continues to feel well- min yellow phlegm, w/o cough/ CP/ ch in dyspnea/ fever/ etc...   He saw DrWrenn 8/11- f/u PSA was back down to 0.07 & plans f/u 6 mo.   He saw Walker Kehr 9/11- cardiac stable, BP controlled, holding NSR s/p DCC on  Coumadin.   He saw DrGearhardt yest- thoracoabd aneurysm stable & f/u 1 yr.   Current Problem List:  COPD (ICD-496) Hx of PNEUMONIA, ORGANISM UNSPECIFIED (ICD-486) PULMONARY NODULE & CAVITY RLL (ICD-518.89) - he is an ex-smoker, having quit in 1990 after 40 yrs of smoking... he has been exercising regularly and walking daily  ~70mi 5-6 days per week... on ADVAIR 250Bid & SPIRIVA daily, +MUCINEX 2Bid w/ Fluids...  ~  baseline CXR & CTChest w/ marked emphysema, atheromatous calcif, 5cm desc thorAA...   ~  CXR & CTA 2/11 in hosp showed cardiomeg + coronary calcif, advanced atherosclerosis of Ao w/ aneuryms 5.8cm into abd, underlying emphysema w/ interst edema & bilat  effusions, no PE...  ~  4/11:  RLL pneumonia which was slow to clear...  ~  6/11:  f/u CXR & CT Chest 6/11 w/ 6cm cavitary area RLL & nodular component inferiorly, +hilar adenopathy, no mediastinal nodes, etc; Tumor markers showed CEA=7.5 & Ca19-9= 10.8; > IR felt lesion was too hi risk for bx, therefore contin Rx & observe.  ~  8/11:  f/u CXR w/o change in RLL opac (no worsening)>> repeat CEA=6.5; plan f/u CT Chest in 170mo.  ~  10/11: f/u CT Chest showed interval decrease in size of RLL cavitary lesion & the inferiorly placed soft tissue component; otherw there is severe emphysema, cardiomeg, coronary calcif, thoracoabd aneurysm w/o change; there was an air-fluid level in a dilated esoph> check Ba Esophagram- pending.  HYPERTENSION (ICD-401.9)                               **followed by Walker Kehr for Cards... CONGESTIVE HEART FAILURE (ICD-428.0) ATRIAL FIBRILLATION (ICD-427.31) w/ clot in left atrial appendage >> resolved on f/u TEE, on COUMADIN per Cards & cardioverted. PREMATURE VENTRICULAR CONTRACTIONS, FREQUENT (ICD-427.69) - on ATENOLOL 50mg /d, LOSARTAN 100mg /d, & LASIX 20mg -2AM + KCl 53mEq/d (prev Norvasc discontinued)... BP= 126/82 and feeling better... home BP checks are "all good" w/ high= 140... hx ACE cough in the past on Lisinopril... denies HA, fatigue, visual changes, recurrentCP, palipit, syncope, dyspnea, edema, etc...  ~  NuclearStressTest 1/05 was neg- no ischemia or infarct (+diaphrag attenuation), EF=56%...  ~  repeat Nuclear study 6/09 was neg- no ischemia, mild inferoapic thinning, not gated due to PVCs...  ~  2DEcho 6/09 showed mild dilated LV w/ EF= 45-50% but no regional wall motion abn, mild AoV calcif & AI, LA mild dil...  ~  12/10:  changed from Atacand to LOSARTAN to save $$  ~  2/11:  in hosp- Cath= mild nonobstructive 3 vessel CAD, mod LVD w/ EF=40-45%Norvasc stopped & Lasix started... on Coumadin for AFib.  ~  Subseq successful [facility] & holding NSR w/ PVCs...  PERIPHERAL  VASCULAR DISEASE (ICD-443.9) - on ASA 81mg /d... he is s/p infrarenal AAA repair w/ right common iliac aneurysm repair (via Ao Bi-iliac graft) in 1998 by DrLawson... also has a known desc thor AA measuring  ~5+cm and followed by DrGearhardt every 18 months...   ~  seen by DrGearhart 4/10 w/ CT scan showing  ~5cm thoracoabdominal aneurysm w/o signif change...   ~  seen by DrGearhart 2/11 in hosp w/ sl incr size of AAA- f/u planned 70mo.  ~  also followed by PV- DrCooper.  ~  seen by DrGearhardt 10/11 & stable, no change, f/u 1 yr.  HYPERCHOLESTEROLEMIA (ICD-272.0) - on LIPITOR 10mg /d...   ~  FLP 5/07 showed TChol 115, Tg 58,  HDL 36, LDL 67  ~  FLP 5/08 showed TChol 118, TG 66, HDL 31, LDL 74  ~  FLP 5/09 showed TChol 126, TG 71, HDL 37, LDL 75  ~  FLP 6/10 showed TChol 116, TG 73, HDL 40, LDL 62  ~  2/11:  FLP not checked during the hospitalization...  ~  FLP 4/11 in hosp showed TChol 94, TG 52, HDL 23, LDL 61  DIVERTICULOSIS OF COLON (ICD-562.10) & COLONIC POLYPS (ICD-211.3) - hx polyps in 2003 = tubular adenoma...  ~  last colonoscopy 9/06 showed divertics, hems, no polyps.  PROSTATE CANCER (ICD-185) - eval by DrWrenn and they decided on XRT by DrKinard- finished 5/09 and he states that DrKinard "released me"... he sees DrWrenn every 6 months & they follow PSA closely (notes reviewed)...  DEGENERATIVE JOINT DISEASE (ICD-715.90) - s/p bilat TKR's, [facility]- DrOlin...  ACTINIC SKIN DAMAGE (ICD-692.70) - followed by Elnora Morrison, he knows to avoid sun exposure, use sun screen, etc...   Preventive Screening-Counseling & Management  Alcohol-Tobacco     Smoking Status: quit     Packs/Day: 2.0     Year Started: 1944     Year Quit: 1990  Caffeine-Diet-Exercise     Does Patient Exercise: yes  Allergies (verified): No Known Drug Allergies  Comments:  Nurse/Medical Assistant: The patient's medications and allergies were reviewed with the patient and were updated in the Medication and  Allergy Lists.  Past History:  Past Medical History: COPD (ICD-496) PNEUMONIA, ORGANISM UNSPECIFIED (ICD-486) PULMONARY NODULE (ICD-518.89) HYPERTENSION (ICD-401.9) CONGESTIVE HEART FAILURE (ICD-428.0) ATRIAL FIBRILLATION (ICD-427.31) COUMADIN THERAPY (ICD-V58.61) PREMATURE VENTRICULAR CONTRACTIONS, FREQUENT (ICD-427.69) PERIPHERAL VASCULAR DISEASE (ICD-443.9) THORACIC AORTIC ANEURYSM (ICD-441.2) HYPERCHOLESTEROLEMIA (ICD-272.0) DIVERTICULOSIS OF COLON (ICD-562.10) COLONIC POLYPS (ICD-211.3) PROSTATE CANCER (ICD-185) DEGENERATIVE JOINT DISEASE (ICD-715.90) ACTINIC SKIN DAMAGE (ICD-692.70)  Past Surgical History: S/P bilat TKR's S/P Abdominal Aortic Aneurysm Repair w/ Ao Bi-iliac graft in 1998 S/P appendectomy & left inguinal hernia repair  Social History: Packs/Day:  2.0  Vital Signs:  Patient profile:   77 year old male Height:      69 inches Weight:      195 pounds O2 Sat:      97 % on Room air Temp:     97.0 degrees F oral Pulse rate:   75 / minute BP sitting:   126 / 82  (left arm) Cuff size:   regular  Vitals Entered By: Randell Loop CMA (July 15, 2010 12:05 PM)  O2 Sat at Rest %:  97 O2 Flow:  Room air CC: 2 month ROV & review of recent f/u CT scan... Is Patient Diabetic? No Pain Assessment Patient in pain? no      Comments meds updated today with pt    Impression & Recommendations:  Problem # 1:  PULMONARY NODULE & CAVITY (ICD-518.89) Recent CT Chest showed improvement in cavitary RLL lesion & the inferiorly placed nodular component... we will continue to follow w/ planned CXR in 3 mo & another CT 28mo after that if all goes well...  Problem # 2:  OTHER DYSPHAGIA (ICD-787.29) CT showed air-fluid level in esoph & on careful questioning he has some mild intermittent dysphagia/ choking on liquids... we discussed poss Ba Esophagram & poss Speech Path swallowing eval> he agrees to proceed w/ the former but wants to hold off on the latter for  now... Orders: Radiology Referral (Radiology)  Problem # 3:  COPD (ICD-496) Assessment: Deteriorated He has severe emphysema on the scan & Rx w/ Advair, Spiriva, Mucinex.Marland KitchenMarland Kitchen  His updated medication list for this problem includes:    Advair Diskus 250-50 Mcg/dose Aepb (Fluticasone-salmeterol) .[name]... 1 inhalation two times a day...    Spiriva Handihaler 18 Mcg Caps (Tiotropium bromide monohydrate) ..... Inhale contents of one capsule by handihaler daily  Problem # 4:  HYPERTENSION (ICD-401.9) Controlled>  same Rx. His updated medication list for this problem includes:    Atenolol 50 Mg Tabs (Atenolol) .[name]... Take 1 tab by mouth once daily...    Losartan Potassium 100 Mg Tabs (Losartan potassium) .[name]... Take 1 tab by mouth once daily...    Lasix 20 Mg Tabs (Furosemide) .[name]... Take 2 tabs by mouth each am...  Problem # 5:  ATRIAL FIBRILLATION (ICD-427.31) He was successfully cardioverted>  maintaining NSR w/ PVCs noted... His updated medication list for this problem includes:    Aspirin 81 Mg Tbec (Aspirin) .[name]... Take one tablet by mouth daily    Warfarin Sodium 2.5 Mg Tabs (Warfarin sodium) .[name]... As directed  by the coumadin clinic    Atenolol 50 Mg Tabs (Atenolol) .[name]... Take 1 tab by mouth once daily...  Problem # 6:  OTHER MEDICAL PROBLEMS AS NOTED>>>  Complete Medication List: 1)  Advair Diskus 250-50 Mcg/dose Aepb (Fluticasone-salmeterol) .[name].. 1 inhalation two times a day... 2)  Spiriva Handihaler 18 Mcg Caps (Tiotropium bromide monohydrate) .... Inhale contents of one capsule by handihaler daily 3)  Mucinex 600 Mg Xr12h-tab (Guaifenesin) .... Take 2 tabs by mouth two times a day w/ plenty of fluids.Marland KitchenMarland Kitchen 4)  Aspirin 81 Mg Tbec (Aspirin) .... Take one tablet by mouth daily 5)  Warfarin Sodium 2.5 Mg Tabs (Warfarin sodium) .... As directed  by the coumadin clinic 6)  Atenolol 50 Mg Tabs (Atenolol) .... Take 1 tab by mouth once daily.Marland KitchenMarland Kitchen 7)  Losartan Potassium 100 Mg Tabs (Losartan potassium)  .... Take 1 tab by mouth once daily.Marland KitchenMarland Kitchen 8)  Lasix 20 Mg Tabs (Furosemide) .... Take 2 tabs by mouth each am... 9)  Klor-con 10 10 Meq Cr-tabs (Potassium chloride) .... Take 1 tablet by mouth once a day 10)  Lipitor 10 Mg Tabs (Atorvastatin calcium) .... Take 1 tab by mouth once daily... 11)  Multivitamins Tabs (Multiple vitamin) .... Take 1 tablet by mouth once a day  Patient Instructions: 1)  Today we updated your med list- see below.... 2)  Continue your current meds the same for now... 3)  We will arrange for a Barium Esophagram to check your swallowing mechanism & we will call you w/ the results.Marland KitchenMarland Kitchen 4)  Let's plan a follow up visit w/ CXR & labs in 3 months.Marland KitchenMarland Kitchen

## 2010-10-25 NOTE — Progress Notes (Signed)
Summary: breathing problem  Phone Note Call from Patient   Caller: Patient Call For: parrett Summary of Call: difficulty breathing [facility] [address] Initial call taken by: Rickard Patience,  October 25, 2009 8:36 AM  Follow-up for Phone Call        called, spoke with pt.  Pt states he finished a 7 day coarse of augmentin on Jan 26 but is still having SOB in the middle of the night and with exertion, still wheezing at night, and still having a prod cough with clear mucus.  ok to come in today to see TP.  ov scheduled for today at 11:00.  Pt aware.  Follow-up by: Gweneth Dimitri RN,  October 25, 2009 9:04 AM

## 2010-10-25 NOTE — Assessment & Plan Note (Signed)
Summary: Acute NP office visit - dyspnea   CC:  pt last seen 10-13-09, states still having dyspnea, and wheezing.  still prod cough but mucus has cleared.  finished augmentin 10-20-09.  denies f/c/s.  History of Present Illness: 77 y/o WM  with known hx of HTN, PVD  09/13/09--here for a follow up visit... he has mult med problems- including HTN - on ATENOLOL 50mg /d, NORVASC 10mg /d, & VERAPAMIL 240mg /d... ~  NuclearStressTest 1/05 was neg- no ischemia or infarct (+diaphrag attenuation), EF=56%...and PERIPHERAL VASCULAR DISEASE (ICD-443.9) - on ASA 81mg /d... he is s/p AAA repair and Ao-bifem bypass in 1998 by DrLawson... also hasa known desc thor AA measuring 4.9cm and followed by DrGearhardt... last CTChest = 10/08 no change... and has been stable over the last 6 months, and he has just completed his 40th and last XRT treatmernt for his prostate cancer..  October 13, 2009 --Presents for increased SOB, wheezing, prod cough with clear/yellow mucus x1week. OTC not helping. Denies chest pain, orthopnea, hemoptysis, fever, n/v/d, edema, headache,recent travel or antibiotics.  Wears out easily. Congestion is thick at times.  Symptoms worse last 2 days. Got dyspneic taking trash out this am.   October 25, 2009--Returns for persistent symptoms of dyspnea. Last visit with Bronchitic changes- tx w/ Augmentin. Cough and congestion is better. But dyspnea is not. Over the last 2 months, he has had a gradual decline. Prior to holidays he walked  1 mile daily without stopping. Now he can barely walk 100 ft without stopping d/t dyspnea. In office today, he had some desaturations but only down to 89% on room air. Does wear out easily. No dyspnea at rest. No associated chest pain. Dyspnea is worse with actiivty and during night. Wakes him up in middle of night short of breath. He has not noticed increased leg swelling. Last echo showed EF 45-50% w/ no wall motion defects , mild dilated LV in 6/09. No new meds , recent  travel or calf pain. Denies chest pain,  hemoptysis, fever, n/v/d, edema, headache.     Medications Prior to Update: 1)  Adult Aspirin Ec Low Strength 81 Mg  Tbec (Aspirin) .... Once Daily 2)  Atenolol 50 Mg  Tabs (Atenolol) .... Take 1 Tab By Mouth Once Daily.Marland KitchenMarland Kitchen 3)  Norvasc 10 Mg  Tabs (Amlodipine Besylate) .... Take 1/2 Tab By Mouth Once Daily.Marland KitchenMarland Kitchen 4)  Losartan Potassium 100 Mg Tabs (Losartan Potassium) .... Take 1 Tab By Mouth Once Daily.Marland KitchenMarland Kitchen 5)  Lipitor 10 Mg  Tabs (Atorvastatin Calcium) .... Take 1 Tab By Mouth Once Daily.Marland KitchenMarland Kitchen 6)  Multivitamins  Tabs (Multiple Vitamin) .... Take 1 Tablet By Mouth Once A Day 7)  Augmentin 875-125 Mg Tabs (Amoxicillin-Pot Clavulanate) .[name].. 1 By Mouth Two Times A Day  Current Medications (verified): 1)  Adult Aspirin Ec Low Strength 81 Mg  Tbec (Aspirin) .... Once Daily 2)  Atenolol 50 Mg  Tabs (Atenolol) .... Take 1 Tab By Mouth Once Daily.Marland KitchenMarland Kitchen 3)  Norvasc 10 Mg  Tabs (Amlodipine Besylate) .... Take 1/2 Tab By Mouth Once Daily.Marland KitchenMarland Kitchen 4)  Losartan Potassium 100 Mg Tabs (Losartan Potassium) .... Take 1 Tab By Mouth Once Daily.Marland KitchenMarland Kitchen 5)  Lipitor 10 Mg  Tabs (Atorvastatin Calcium) .... Take 1 Tab By Mouth Once Daily.Marland KitchenMarland Kitchen 6)  Multivitamins  Tabs (Multiple Vitamin) .... Take 1 Tablet By Mouth Once A Day  Allergies (verified): No Known Drug Allergies  Past History:  Past Surgical History: Last updated: 09/13/2009 S/P bilat TKR's...  S/P Abdominal Aortic Aneurysm Repair w/ Ao  Bi-iliac graft in 1998 S/P appendectomy & left inguinal hernia repair  Family History: Last updated: 04/12/09 mother died at age 39 w/ senile dementia father died at age 30 from a stroke 6 siblings: 2 Bothers died- one age 51 w/ DM, vasc dis, heart dis; one age 55 w/ obesity & heart problems... 4 Sisters died- alzheimer's, heart dis, cancer ("in her back" ?myeloma)...  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 he has been exercising regularly and walking daily  ~11mi 5-6 days per week...  ~  baseline CXR & CTChest w/ marked emphysema, atheromatous calcif, 5cm desc thorAA...   ~  6/10: denies cough, sputum, hemoptysis, worsening dyspnea, wheezing, chest pains, snoring, daytime hypersomnolence, etc...   HYPERTENSION (ICD-401.9) - on ATENOLOL 50mg /d, NORVASC 10mg /d, & ATACAND 8mg /d... BP= 142/84 and feeling well... home BP checks are "all good" w/ high= 140... hx ACE cough in the past on Lisinopril... denies HA, fatigue, visual changes, CP, palipit, syncope, dyspnea, edema, etc...  ~  NuclearStressTest 1/05 was neg- no ischemia or infarct (+diaphrag attenuation), EF=56%...  ~  repeat Nuclear study 6/09 was neg- no ischemia, mild inferoapic thinning, not gated due to PVCs...  ~  2DEcho 6/09 showed mild dilated LV w/ EF= 45-50% but no regional wall motion abn, mild AoV calcif & AI, LA mild dil...  ~  12/10:  changed from Atacand to LOSARTAN to save $$  PREMATURE VENTRICULAR CONTRACTIONS, FREQUENT (ICD-427.69) - followed by Walker Kehr a   PERIPHERAL VASCULAR DISEASE (ICD-443.9) - on ASA 81mg /d... he is s/p infrarenal AAA repair w/ right common iliac aneurysm repair (via Ao Bi-iliac graft) in 1998 by DrLawson... also has a known desc thor AA measuring  ~5cm and followed by DrGearhardt every 18 months...   ~  seen by DrGearhart 4/10 w/ CT scan showing  ~5cm thoracoabdominal aneurysm w/o signif change... f/u planned in another 55mo...  HYPERCHOLESTEROLEMIA (ICD-272.0) - on LIPITOR 10mg /d...   ~  FLP 5/07 showed TChol 115, Tg 58, HDL 36, LDL 67  ~  FLP 5/08 showed TChol 118, TG 66, HDL 31, LDL 74  ~  FLP 5/09 showed TChol 126, TG 71, HDL 37, LDL 75  ~  FLP 6/10  showed TChol 116, TG 73, HDL 40, LDL 62  DIVERTICULOSIS OF COLON (ICD-562.10) & COLONIC POLYPS (ICD-211.3) - hx polyps in 2003 = tubular adenoma...    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  still prod cough but mucus has cleared.  finished augmentin 10-20-09.  denies f/c/s Is Patient Diabetic? No Comments Medications reviewed with patient Daytime contact number verified with patient. Boone Master CNA  October 25, 2009 10:53 AM    Ambulatory Pulse Oximetry  Resting; HR_84____    02 Sat__93%___  Lap1 (185 feet)   HR_93____   02 Sat_92%____ Lap2 (185 feet)   HR_96____   02 Sat_89%____    Lap3 (185 feet)   HR_94____   02 Sat_90%____  _XX__Test Completed without Difficulty ___Test Stopped due to:   pt resting after test oxygen level at 91% and HR 97 Randell Loop CMA  October 25, 2009 11:26 AM    Physical Exam  Additional Exam:  WD, WN, 77 y/o WM in NAD... GENERAL:  Alert & oriented; pleasant & cooperative... HEENT:  [place]/AT, EOM-wnl, PERRLA, EACs-clear, TMs-wnl, NOSE-clear, THROAT-clear & wnl. NECK:  Supple w/ fairROM; no JVD; normal carotid impulses w/o bruits; no thyromegaly or nodules palpated; no lymphadenopathy. CHEST:  decr BS bilat, clear to P & A; without wheezes/ rales/ or rhonchi. HEART:  regular rhythm, etopic beats noted. , gr1/6 SEM without rubs or gallops heard... ABDOMEN:  Soft & nontender; normal bowel sounds; no organomegaly or  masses detected. EXT: s/p bilat TKR's, mod arthritic changes; no varicose veins/ +venous insuffic/ no edema. NEURO:  CN's intact;  gait abn; no focal neuro deficits... DERM:  No lesions noted; no rash etc...   EKG w/ junctional rhythm w/ HR at 64     Impression & Recommendations:  Problem # 1:  COPD (ICD-496) Progressive delcine in activity tolerance w/ some desaturation down to 89% on room air.  Will start Spiriva once daily  Waiting on chart to review PFTs, may need repeat in future.  No O2 at this time.  If not improving may need to set up for ONO if not improving   Problem # 2:  DYSPNEA (ICD-786.05)  Progressive DOE and nocturnal Dyspnea ? etilogy may have component of volume overload.  CXR pending.  EKG shows a probable junctional rhythm w/ bradycardia vs slow a-fib  will tx w/ gentle diuresis , check labs.  refer to cardiology for evaluation.  follow up 2 weeks   Orders: T-2 View CXR (71020TC) Cardiology Referral (Cardiology)  Medications Added to Medication List This Visit: 1)  Lasix 20 Mg Tabs (Furosemide) .[name].. 1 by mouth once daily as needed leg swelling  Complete Medication List: 1)  Adult Aspirin Ec Low Strength 81 Mg Tbec (Aspirin) .... Once daily 2)  Atenolol 50 Mg Tabs (Atenolol) .... Take 1 tab by mouth once daily.Marland KitchenMarland Kitchen 3)  Norvasc 10 Mg Tabs (Amlodipine besylate) .... Take 1/2 tab by mouth once daily.Marland KitchenMarland Kitchen 4)  Losartan Potassium 100 Mg Tabs (Losartan potassium) .... Take 1 tab by mouth once daily.Marland KitchenMarland Kitchen 5)  Lipitor 10 Mg Tabs (Atorvastatin calcium) .... Take 1 tab by mouth once daily.Marland KitchenMarland Kitchen 6)  Multivitamins Tabs (Multiple vitamin) .... Take 1 tablet by mouth once a day 7)  Lasix 20 Mg Tabs (Furosemide) .[name].. 1 by mouth once daily as needed leg swelling  Other Orders: TLB-BMP (Basic Metabolic Panel-BMET) (80048-METABOL) TLB-CBC Platelet - w/Differential (85025-CBCD) TLB-TSH (Thyroid Stimulating Hormone) (84443-TSH) TLB-B12 + Folate Pnl (09811_91478-G95/AOZ) TLB-IBC  Pnl (Iron/FE;Transferrin) (83550-IBC) TLB-Udip ONLY (81003-UDIP) TLB-Udip w/ Micro (81001-URINE) TLB-BNP (B-Natriuretic Peptide) (83880-BNPR)  Patient Instructions: 1)  Lasix 20mg  once daily for 1 week, then 1 once daily as needed leg swelling.  2)  Add Spiriva once daily - 1 capsule in inhaler - then 2 puffs once daily  3)  I will  call with labs.  4)  follow up 2 weeks Dr. Kriste Basque  5)  Please contact office for sooner follow up if symptoms do not improve or worsen  Prescriptions: LASIX 20 MG TABS (FUROSEMIDE) 1 by mouth once daily as needed leg swelling  #30 x 5   Entered and Authorized by:   Rubye Oaks NP   Signed by:   Esa Raden NP on 10/25/2009   Method used:   Electronically to        [id number]* (retail)       [address]       [place], [place]  [address]       Ph: [phone] or [phone]       Fax: [phone]   RxID:   [id number]      CardioPerfect ECG  ID: [id number] Patient: Ryan Hess, Ryan Hess DOB: 02/03/1924 Age: 77 Years Old Sex: Male Race: White Physician: Lavonte Palos,t Technician: Randell Loop CMA Height: 69 Weight: 219.38 Status: Unconfirmed Past Medical History:  COPD (ICD-496) HYPERTENSION (ICD-401.9) PREMATURE VENTRICULAR CONTRACTIONS, FREQUENT (ICD-427.69) PERIPHERAL VASCULAR DISEASE (ICD-443.9) THORACIC AORTIC ANEURYSM (ICD-441.2) HYPERCHOLESTEROLEMIA (ICD-272.0) DIVERTICULOSIS OF COLON (ICD-562.10) COLONIC POLYPS (ICD-211.3) PROSTATE CANCER (ICD-185) DEGENERATIVE JOINT DISEASE (ICD-715.90) ACTINIC SKIN DAMAGE (ICD-692.70)   Recorded: 10/25/2009 11:16 AM QRS: 103 QT/QTc/QTd: 408 ms / 415 ms / 80 ms - Heart rate (maximum exercise)  / -21 deg / -67 deg - Heart rate (maximum exercise)  Heartrate: 64 bpm  Interpretation:   accelerated AV junctional rhythm (no atrial activity detected)  multiform premature ventricular complexes  horizontal axis  moderate inferior repolarization  disturbance, consider ischemia or LV overload   negative T in aVF    with negative T in II III   Abnormal ECG

## 2010-10-25 NOTE — Medication Information (Signed)
Summary: rov/tm  Anticoagulant Therapy  Managed by: Bethena Midget, RN, BSN Referring MD: [name] MD, Lavera Guise MD: Daleen Squibb MD, Maisie Fus Indication 1: Atrial Fibrillation Lab Used: [facility]  Site: [address] INR POC 2.2 INR RANGE 2.0-3.0  Dietary changes: no    Health status changes: no    Bleeding/hemorrhagic complications: yes       Details: Dr [name] states that pt verbalized he was having Hemoptsis  Recent/future hospitalizations: no    Any changes in medication regimen? yes       Details: Augmentin  Recent/future dental: no  Any missed doses?: no       Is patient compliant with meds? yes      Comments: Saw Dr [name] today a  Allergies: No Known Drug Allergies  Anticoagulation Management History:      The patient is taking warfarin and comes in today for a routine follow up visit.  Positive risk factors for bleeding include an age of 74 years or older.  The bleeding index is 'intermediate risk'.  Positive CHADS2 values include History of CHF, History of HTN, and Age > 28 years old.  His last INR was 1.4 ratio.  Anticoagulation responsible Chevy Sweigert: Daleen Squibb MD, Maisie Fus.  INR POC: 2.2.  Cuvette Lot#: [id number].  Exp: 06/2011.    Anticoagulation Management Assessment/Plan:      The patient's current anticoagulation dose is Warfarin sodium 2.5 mg tabs: as directed  by the coumadin clinic.  The target INR is 2.0-3.0.  The next INR is due 06/13/2010.  Anticoagulation instructions were given to patient.  Results were reviewed/authorized by Bethena Midget, RN, BSN.  He was notified by Bethena Midget, RN, BSN.         Prior Anticoagulation Instructions: INR 2.8  Continue on same dosage 1/2 tablet daily except 1 tablet on Fridays. Recheck in 3 weeks.      Current Anticoagulation Instructions: INR 2.2 Continue 2.5mg s daily except 5mg s on Friday. Recheck on 05/13/10.

## 2010-10-25 NOTE — Assessment & Plan Note (Signed)
Summary: F2M/DM   History of Present Illness: Ryan Hess is seen today in F/U for afib.  He was initially seen in 4/11 during a pneumonia where he had afib.  Initial TEE showed LAA clot.  F/U TEE about a month ago showed no clot and he was successfully cardioverted.  He has been doing well with baseline dyspnea from his lung disease.  He denies SSCP, palpitations, syncope, edema or TIA's  ECG today confirms maint. of NSR with occasional PVC's.  His INR's have been Rx.  He had a non-ischemic myovue with normal EF on 1/11.  He has a history of prostate CA and sees Dr Jeffie Pollock.  His PSA has bumped slightly and is being followed closely.  Since his pneumonia he has lost about 15 lbs but is stable now.    Current Problems (verified): 1)  Pneumonia, Organism Unspecified  (ICD-486) 2)  Coumadin Therapy  (ICD-V58.61) 3)  Dyspnea  (ICD-786.05) 4)  Weakness  (ICD-780.79) 5)  COPD  (ICD-496) 6)  Hypertension  (ICD-401.9) 7)  Congestive Heart Failure  (ICD-428.0) 8)  Atrial Fibrillation  (ICD-427.31) 9)  Premature Ventricular Contractions, Frequent  (ICD-427.69) 10)  Peripheral Vascular Disease  (ICD-443.9) 11)  Thoracic Aortic Aneurysm  (ICD-441.2) 12)  Hypercholesterolemia  (ICD-272.0) 13)  Diverticulosis of Colon  (ICD-562.10) 14)  Colonic Polyps  (ICD-211.3) 15)  Prostate Cancer  (ICD-185) 16)  Degenerative Joint Disease  (ICD-715.90) 17)  Actinic Skin Damage  (ICD-692.70)  Current Medications (verified): 1)  Advair Diskus 250-50 Mcg/dose Aepb (Fluticasone-Salmeterol) .[name].. 1 Inhalation Two Times A Day... 2)  Spiriva Handihaler 18 Mcg Caps (Tiotropium Bromide Monohydrate) .... Inhale Contents of One Capsule By Handihaler Daily 3)  Warfarin Sodium 2.5 Mg Tabs (Warfarin Sodium) .... Take As Directed 4)  Adult Aspirin Ec Low Strength 81 Mg  Tbec (Aspirin) .... Once Daily 5)  Atenolol 50 Mg  Tabs (Atenolol) .... Take 1 Tab By Mouth Once Daily.Marland KitchenMarland Kitchen 6)  Losartan Potassium 100 Mg Tabs (Losartan Potassium) ....  Take 1 Tab By Mouth Once Daily.Marland KitchenMarland Kitchen 7)  Lasix 20 Mg Tabs (Furosemide) .... Take 2 Tabs By Mouth Each Am... 8)  Klor-Con 10 10 Meq Cr-Tabs (Potassium Chloride) .... Take 1 Tablet By Mouth Once A Day 9)  Lipitor 10 Mg  Tabs (Atorvastatin Calcium) .... Take 1 Tab By Mouth Once Daily... 10)  Multivitamins  Tabs (Multiple Vitamin) .... Take 1 Tablet By Mouth Once A Day 11)  Mucinex D 60-600 Mg Xr12h-Tab (Pseudoephedrine-Guaifenesin) .... Take 1-2 Tablets Every 12 Hours As Needed  Allergies (verified): No Known Drug Allergies  Past History:  Past Medical History: Last updated: 11/16/2009  COPD (ICD-496) HYPERTENSION (ICD-401.9) CONGESTIVE HEART FAILURE (ICD-428.0) ATRIAL FIBRILLATION (ICD-427.31) PREMATURE VENTRICULAR CONTRACTIONS, FREQUENT (ICD-427.69) PERIPHERAL VASCULAR DISEASE (ICD-443.9) THORACIC AORTIC ANEURYSM (ICD-441.2) HYPERCHOLESTEROLEMIA (ICD-272.0) DIVERTICULOSIS OF COLON (ICD-562.10) COLONIC POLYPS (ICD-211.3) PROSTATE CANCER (ICD-185) DEGENERATIVE JOINT DISEASE (ICD-715.90) ACTINIC SKIN DAMAGE (ICD-692.70)  Past Surgical History: Last updated: 11/16/2009 S/P bilat TKR's S/P Abdominal Aortic Aneurysm Repair w/ Ao Bi-iliac graft in 1998 S/P appendectomy & left inguinal hernia repair  Family History: Last updated: 03/16/2009 mother died at age 75 w/ senile dementia father died at age 92 from a stroke 6 siblings: 2 Bothers died- one age 32 w/ DM, vasc dis, heart dis; one age 7 w/ obesity & heart problems... 4 Sisters died- alzheimer's, heart dis, cancer ("in her back" ?myeloma)...  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   Vital Signs:  Patient profile:   77 year old male Weight:      189 pounds Pulse rate:   60 / minute Pulse rhythm:   regular BP sitting:   100 / 60  (left arm) Cuff size:   regular  Vitals Entered By: Fredia Beets, RN (February 25, 2010 7:55 AM)  Physical Exam  General:  Affect appropriate Healthy:  appears stated age 77: normal Neck supple with no adenopathy JVP normal no bruits no thyromegaly Lungs clear with no wheezing and good diaphragmatic motion Heart:  S1/S2 no murmur,rub, gallop or click PMI normal Abdomen: benighn, BS positve, no tenderness, no AAA no bruit.  No HSM or HJR Distal pulses intact with no bruits No edema Neuro non-focal Skin warm and dry    Impression & Recommendations:  Problem # 1:  ATRIAL FIBRILLATION (ICD-427.31) Maint NSR post [facility] 4/11 with resolution of LAA clot His updated medication list for this problem includes:    Warfarin Sodium 2.5 Mg Tabs (Warfarin sodium) .[name]... Take as directed    Adult Aspirin Ec Low Strength 81 Mg Tbec (Aspirin) ..... Once daily    Atenolol 50 Mg Tabs (Atenolol) .[name]... Take 1 tab by mouth once daily...  Problem # 2:  COUMADIN THERAPY (ICD-V58.61) Continue given PAF, age and HTN.  f/U clinic.  Has been Rx  Problem # 3:  DYSPNEA (ICD-786.05) COPD with resolved pneumonia.  f/U [place] His updated medication list for this problem includes:    Adult Aspirin Ec Low Strength 81 Mg Tbec (Aspirin) ..... Once daily    Atenolol 50 Mg Tabs (Atenolol) .[name]... Take 1 tab by mouth once daily...    Losartan Potassium 100 Mg Tabs (Losartan potassium) .[name]... Take 1 tab by mouth once daily...    Lasix 20 Mg Tabs (Furosemide) .[name]... Take 2 tabs by mouth each am...  Problem # 4:  HYPERTENSION (ICD-401.9) Well controlled His updated medication list for this problem includes:    Adult Aspirin Ec Low Strength 81 Mg Tbec (Aspirin) ..... Once daily    Atenolol 50 Mg Tabs (Atenolol) .[name]... Take 1 tab by mouth once  daily...    Losartan Potassium 100 Mg Tabs (Losartan potassium) .[name]... Take 1 tab by mouth once daily...    Lasix 20 Mg Tabs (Furosemide) .[name]... Take 2 tabs by mouth each am...  Patient Instructions: 1)  Your physician recommends that you schedule a follow-up appointment in: 3 MONTHS   EKG Report  Procedure date:  02/25/2010  Findings:      NSR 78 PCV's LAD Nonspecific ST/T wave changes

## 2010-10-25 NOTE — Consult Note (Signed)
Summary: [facility]  [facility]   Imported By: Jerrye Noble D'jimraou 11/08/2007 10:39:05  _____________________________________________________________________  External Attachment:    Type:   Image     Comment:   External Document

## 2010-10-25 NOTE — Letter (Signed)
Summary: [facility]   [facility]   Imported By: Lennie Odor 08/05/2010 10:51:39  _____________________________________________________________________  External Attachment:    Type:   Image     Comment:   External Document

## 2010-10-25 NOTE — Medication Information (Signed)
Summary: rov/ewj  Anticoagulant Therapy  Managed by: Eda Keys, PharmD Referring MD: [name] MD, Lavera Guise MD: Clifton James MD, Cristal Deer Indication 1: Atrial Fibrillation Lab Used: [facility] [place] Site: [address] INR POC 2.1 INR RANGE 2.0-3.0  Dietary changes: no    Health status changes: no    Bleeding/hemorrhagic complications: no    Recent/future hospitalizations: no    Any changes in medication regimen? no    Recent/future dental: no  Any missed doses?: no       Is patient compliant with meds? yes      Comments: Pt pending cardioversion, next appt with Dr. [name] on March 24th to reassess.  See weekly  Allergies: No Known Drug Allergies  Anticoagulation Management History:      The patient is taking warfarin and comes in today for a routine follow up visit.  Positive risk factors for bleeding include an age of 75 years or older.  The bleeding index is 'intermediate risk'.  Positive CHADS2 values include History of CHF, History of HTN, and Age > 25 years old.  His last INR was 12.6 ratio.  Anticoagulation responsible provider: Clifton James MD, Cristal Deer.  INR POC: 2.1.  Cuvette Lot#: [id number].  Exp: 01/2011.    Anticoagulation Management Assessment/Plan:      The patient's current anticoagulation dose is Warfarin sodium 2.5 mg tabs: take as directed.  The target INR is 2.0-3.0.  The next INR is due 11/30/2009.  Anticoagulation instructions were given to patient.  Results were reviewed/authorized by Eda Keys, PharmD.  He was notified by Eda Keys.         Prior Anticoagulation Instructions: INR 2.6  Continue on same dosage alternating 1 whole tablet and 1/2 tablet every other day.  Recheck in 1 week.    Current Anticoagulation Instructions: INR 2.1  Take 1 tablet today.  Then take 1/2 tablet on Friday and Sunday and take 1 tablet all other days.  Return to clinic in 1 week.

## 2010-10-25 NOTE — Medication Information (Signed)
Summary: rov/sl  Anticoagulant Therapy  Managed by: Weston Brass, PharmD Referring MD: [name] MD, Lavera Guise MD: Gala Romney MD, Reuel Boom Indication 1: Atrial Fibrillation Lab Used: [facility] [place] Site: [address] INR POC 3.2 INR RANGE 2.0-3.0  Dietary changes: no    Health status changes: no    Bleeding/hemorrhagic complications: no    Recent/future hospitalizations: no    Any changes in medication regimen? no    Recent/future dental: no  Any missed doses?: yes     Details: pt having tooth pulled next week and needs to be off Coumadin.  Per Dr. [name], will bridge with Lovenox.   Is patient compliant with meds? yes      Comments: weight- 88 kg, SCr- 1.1  CrCl- 49 mL/min  Allergies: No Known Drug Allergies  Anticoagulation Management History:      The patient is taking warfarin and comes in today for a routine follow up visit.  Positive risk factors for bleeding include an age of 46 years or older.  The bleeding index is 'intermediate risk'.  Positive CHADS2 values include History of CHF, History of HTN, and Age > 61 years old.  His last INR was 1.4 ratio.  Anticoagulation responsible Mahlia Fernando: Bensimhon MD, Reuel Boom.  INR POC: 3.2.  Cuvette Lot#: [id number].  Exp: 07/2011.    Anticoagulation Management Assessment/Plan:      The patient's current anticoagulation dose is Warfarin sodium 2.5 mg tabs: as directed  by the coumadin clinic.  The target INR is 2.0-3.0.  The next INR is due 09/02/2010.  Anticoagulation instructions were given to patient.  Results were reviewed/authorized by Weston Brass, PharmD.         Prior Anticoagulation Instructions: INR 3.0  Continue taking Coumadin 1 tab (2.5 mg) on all days except for Coumadin 2 tabs (5 mg) on Fridays. Return to clinic in 4 weeks.   Current Anticoagulation Instructions: INR 3.2  11/30- Coumadin 1/2 tablet  12/1- Coumadin 1 tablet 12/2- No Coumadin or Lovenox 12/3- Inject Lovenox 120mg  syringe into  stomach once daily in AM 12/4- Lovenox 120mg  injection in AM  12/5- Lovenox 120mg  injection in AM (prior to 10am) 12/6- Day of Procedure.  That pm, restart Coumadin with 2 tablets. 12/7- Lovenox 120mg  injection once daily and Coumadin 2 tablets 12/8- Lovenox 120mg  injection once daily and Coumadin 1 tablet 12/9- Coumadin Clinic Appt.

## 2010-10-25 NOTE — Letter (Signed)
Summary: [facility]  [facility]   Imported By: Sherian Rein 08/11/2010 14:02:18  _____________________________________________________________________  External Attachment:    Type:   Image     Comment:   External Document

## 2010-10-25 NOTE — Medication Information (Signed)
Summary: rov/tm  Anticoagulant Therapy  Managed by: Cloyde Reams, RN, BSN Referring MD: [name] MD, Lavera Guise MD: Jens Som MD, Arlys John Indication 1: Atrial Fibrillation Lab Used: [facility] [place] Site: [address] INR POC 2.0 INR RANGE 2.0-3.0  Dietary changes: no    Health status changes: no    Bleeding/hemorrhagic complications: no    Recent/future hospitalizations: yes       Details: Pending DCCV tomorrow.  Any changes in medication regimen? no    Recent/future dental: no  Any missed doses?: no       Is patient compliant with meds? yes       Allergies (verified): No Known Drug Allergies  Anticoagulation Management History:      The patient is taking warfarin and comes in today for a routine follow up visit.  Positive risk factors for bleeding include an age of 75 years or older.  The bleeding index is 'intermediate risk'.  Positive CHADS2 values include History of CHF, History of HTN, and Age > 56 years old.  His last INR was 12.6 ratio.  Anticoagulation responsible provider: Jens Som MD, Arlys John.  INR POC: 2.0.  Cuvette Lot#: [id number].  Exp: 01/2011.    Anticoagulation Management Assessment/Plan:      The patient's current anticoagulation dose is Warfarin sodium 2.5 mg tabs: take as directed.  The target INR is 2.0-3.0.  The next INR is due 12/31/2009.  Anticoagulation instructions were given to patient.  Results were reviewed/authorized by Cloyde Reams, RN, BSN.  He was notified by Cloyde Reams RN.         Prior Anticoagulation Instructions: INR 1.9 Today take 1.5 tablets then change dose to 1 tablet everyday except 1/2 on Fridays. Recheck INR in one week.   Current Anticoagulation Instructions: INR 2.0  Take 2 tablets today then resume same dosage 1 tablet daily except 1/2 tablet on Fridays.  Recheck in 1 week.

## 2010-10-25 NOTE — Assessment & Plan Note (Signed)
Summary: 6m reck/klw   CC:  4 month ROV & post-hosp follow up....  History of Present Illness: 77 y/o WM here for a follow up visit... he has multiple medical problems as noted below...     ~  Jun10:  58mo follow up doing well- no new complaints or concerns... he saw DrGearhart 4/10- f/u thoracoabdominal aneurysm (s/p AAA &right common iliac aneurysm repair 1998 by Windell Moulding)... 35mo CT scan showed  ~no change in the 5cm aneurysm & he rec keep BP under control & f/u in another 35mo... he also had f/u DrWrenn for Urology- f/u Prostate Cancer s/p XRT finished 5/09... doing well w/o symptoms and PSA was down to 0.30- plans f/u another 58mo... finally saw Walker Kehr for 44yr cardiac f/u doing well- no change in meds and yearly ROV is planned...  ~  Dec10:  he's had a good 58mo- only c/o some right hip pain... known severe DJD w/ bilat TKR's DrOlin & pt will continue Tylenol/ OTC meds and f/u w/ Ortho... BP controlled on the Aten/ Norvasc/ Atacand but would like cheaper subst to the ARB- change to LOSARTAN... Chol well controlled on diet + Lip10... OK Flu shot today...   ~  November 16, 2009:  Hosp 2/4 - 11/05/09 for CP- felt to be noncardiac in origin w/ cath showing nonobstructive CAD but he had new AFib, CHF w/ EF ~45% w/ HK & TEE showing left atrial appendage clot... he was diuresed & meds adjusted, placed on Coumadin w/ careful f/u by Walker Kehr & the CC... he had PV f/u w/ CT to check his thoracoabd aneurysm= 5.6cm (min larger serially) & f/u planned 58mo...  from the pulm standpoint he had a bronchitic exac treated w/ Doxy, and reminded to take the Advair/ Spiriva regularly...  currently feeling better w/ Lasix/ KCl added & using inhalers regularly...  we will check BMet/ BNP today- all questions answered.   ~  March 15, 2010:  he was hosp 4/11 by [facility] w/ RLL Pneum- NOS, treated empirically w/ Vanc/ Zosyn, then Po Avelox & outpt follow up... RLL has not cleared & he developed some hemoptysis (on Coumadin for AFib)  & CT Chest 03/11/10 showed emphysema w/ 6cm cavitary lesion RLL & a nodular component inferiorly, +right hilar adenop, no mediastinal adenop, ThorAA measures 5.8cm, etc...  he is an ex-smoker quit 1990 w/ 56yrs up to 2ppd prior... states he feels pretty good- no CP, mild cough, beige phlegm, no blood off the Coumadin x several days now... **we discussed f/u labs, check tumor markers, contact IR re: needle bx of this lesion;  in the interim> Rx w/ Augmentin 875mg Bid, & leave him off the Coumadin for now.   **NOTE: IR, DrSchick, felt bx was hi risk w/ his emphysema (prob get pneumothorax, chest tube, etc) & rec holding off on bx for now.   Current Problem List:  COPD (ICD-496) - he is an ex-smoker, having quit in 1990 after 40 yrs of smoking... he has been exercising regularly and walking daily  ~6mi 5-6 days per week... on ADVAIR 250Bid & SPIRIVA daily, +MUCINEX 2Bid w/ Fluids...  ~  baseline CXR & CTChest w/ marked emphysema, atheromatous calcif, 5cm desc thorAA...   ~  CXR & CTA 2/11 in hosp showed cardiomeg + coronary calcif, advanced atherosclerosis of Ao w/ aneuryms 5.8cm into abd, underlying emphysema w/ interst edema & bilat effusions, no PE...  ~  4/11:  RLL pneumonia which was slow to clear; f/u CXR & CT Chest 6/11 w/  6cm cavitary area RLL & nodular component inferiorly, +hilar adenopathy, no mediastinal nodes, etc >>  ~  6/11:  IR felt lesion was too hi risk for bx, therefore try continued antibiotic Rx, Mucinex, Advair, Spiriva, etc...  HYPERTENSION (ICD-401.9)                               **followed by Walker Kehr for Cards... CONGESTIVE HEART FAILURE (ICD-428.0) ATRIAL FIBRILLATION (ICD-427.31) w/ clot in left atrial appendage >> resolved on f/u TEE, on COUMADIN per Cards. PREMATURE VENTRICULAR CONTRACTIONS, FREQUENT (ICD-427.69) - on ATENOLOL 50mg /d, LOSARTAN 100mg /d, & LASIX 20mg -2AM + KCl 69mEq/d (prev Norvasc discontinued)... BP= 106/72 and feeling better... home BP checks are "all good"  w/ high= 140... hx ACE cough in the past on Lisinopril... denies HA, fatigue, visual changes, recurrentCP, palipit, syncope, dyspnea, edema, etc...  ~  NuclearStressTest 1/05 was neg- no ischemia or infarct (+diaphrag attenuation), EF=56%...  ~  repeat Nuclear study 6/09 was neg- no ischemia, mild inferoapic thinning, not gated due to PVCs...  ~  2DEcho 6/09 showed mild dilated LV w/ EF= 45-50% but no regional wall motion abn, mild AoV calcif & AI, LA mild dil...  ~  12/10:  changed from Atacand to LOSARTAN to save $$  ~  2/11:  in hosp- Norvasc stopped & Lasix started... on Coumadin for AFib.  PERIPHERAL VASCULAR DISEASE (ICD-443.9) - on ASA 81mg /d... he is s/p infrarenal AAA repair w/ right common iliac aneurysm repair (via Ao Bi-iliac graft) in 1998 by DrLawson... also has a known desc thor AA measuring  ~5+cm and followed by DrGearhardt every 18 months...   ~  seen by DrGearhart 4/10 w/ CT scan showing  ~5cm thoracoabdominal aneurysm w/o signif change...   ~  seen by DrGearhart 2/11 in hosp w/ sl incr size of AAA- f/u planned 90mo.  ~  also followed by PV- DrCooper.  HYPERCHOLESTEROLEMIA (ICD-272.0) - on LIPITOR 10mg /d...   ~  FLP 5/07 showed TChol 115, Tg 58, HDL 36, LDL 67  ~  FLP 5/08 showed TChol 118, TG 66, HDL 31, LDL 74  ~  FLP 5/09 showed TChol 126, TG 71, HDL 37, LDL 75  ~  FLP 6/10 showed TChol 116, TG 73, HDL 40, LDL 62  ~  2/11:  FLP not checked during the hospitalization...  ~  FLP 4/11 in hosp showed TChol 94, TG 52, HDL 23, LDL 61  DIVERTICULOSIS OF COLON (ICD-562.10) & COLONIC POLYPS (ICD-211.3) - hx polyps in 2003 = tubular adenoma...  ~  last colonoscopy 9/06 showed divertics, hems, no polyps.  PROSTATE CANCER (ICD-185) - eval by DrWrenn and they decided on XRT by DrKinard- finished 5/09 and he states that DrKinard "released me"... saw DrWrenn 11/09 and was doing well- f/u planned Q19mo...  ~  5/10: f/u by DrWrenn doing well... PSA= 0.30  ~  11/10:  f/u DrWrenn w/ PSA=  0.04  DEGENERATIVE JOINT DISEASE (ICD-715.90) - s/p bilat TKR's, [facility]- DrOlin...  ACTINIC SKIN DAMAGE (ICD-692.70) - followed by Elnora Morrison, he knows to avoid sun exposure, use sun screen, etc...   Preventive Screening-Counseling & Management  Alcohol-Tobacco     Smoking Status: quit     Year Quit: 1990  Allergies (verified): No Known Drug Allergies  Comments:  Nurse/Medical Assistant: The patient's medications and allergies were reviewed with the patient and were updated in the Medication and Allergy Lists.  Past History:  Past Medical History: COPD (ICD-496)  PNEUMONIA, ORGANISM UNSPECIFIED (ICD-486) PULMONARY NODULE (ICD-518.89) HYPERTENSION (ICD-401.9) CONGESTIVE HEART FAILURE (ICD-428.0) ATRIAL FIBRILLATION (ICD-427.31) COUMADIN THERAPY (ICD-V58.61) PREMATURE VENTRICULAR CONTRACTIONS, FREQUENT (ICD-427.69) PERIPHERAL VASCULAR DISEASE (ICD-443.9) THORACIC AORTIC ANEURYSM (ICD-441.2) HYPERCHOLESTEROLEMIA (ICD-272.0) DIVERTICULOSIS OF COLON (ICD-562.10) COLONIC POLYPS (ICD-211.3) PROSTATE CANCER (ICD-185) DEGENERATIVE JOINT DISEASE (ICD-715.90) ACTINIC SKIN DAMAGE (ICD-692.70)  Past Surgical History: S/P bilat TKR's S/P Abdominal Aortic Aneurysm Repair w/ Ao Bi-iliac graft in 1998 S/P appendectomy & left inguinal hernia repair  Family History: Reviewed history from 03/15/2009 and no changes required. mother died at age 28 w/ senile dementia father died at age 5 from a stroke 6 siblings: 2 Bothers died- one age 8 w/ DM, vasc dis, heart dis; one age 72 w/ obesity & heart problems... 4 Sisters died- alzheimer's, heart dis, cancer ("in her back" ?myeloma)...  Social History: Reviewed history from 10/25/2009 and no changes required. 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  The patient denies anorexia, fever, weight loss, weight gain, vision loss, decreased hearing, hoarseness, chest pain, syncope, peripheral edema, headaches, abdominal pain, melena, hematochezia, severe indigestion/heartburn, hematuria, incontinence, suspicious skin lesions, transient blindness, difficulty walking, depression, unusual weight change, abnormal bleeding, enlarged lymph nodes, and angioedema.    Vital Signs:  Patient profile:   77 year old male Height:      69 inches Weight:      191.31 pounds BMI:     28.35 O2 Sat:      96 % on Room air Temp:     98.7 degrees F oral Pulse rate:   60 / minute BP sitting:   106 / 72  (left arm) Cuff size:   regular  Vitals Entered By: Randell Loop CMA (March 15, 2010 8:42 AM)  O2 Sat at Rest %:  96 O2 Flow:  Room air CC: 4 month ROV & post-hosp follow up... Is Patient Diabetic? No Pain Assessment Patient in pain? no      Comments meds updated today with pt---coumadin is on hold for now   Physical Exam  Additional Exam:  WD, WN, 77 y/o WM in NAD... GENERAL:  Alert & oriented; pleasant & cooperative... HEENT:  Helen/AT, EOM-wnl, PERRLA, EACs-clear, TMs-wnl, NOSE-clear, THROAT-clear & wnl. NECK:  Supple w/ fairROM; no JVD; normal carotid impulses w/o bruits; no thyromegaly or nodules palpated; no lymphadenopathy. CHEST:  decr BS bilat, clear to P & A; without wheezes/ rales/ or rhonchi heard... HEART:  irregular rhythm, gr1/6 SEM without rubs or gallops detected... ABDOMEN:  Soft & nontender; normal bowel sounds; no organomegaly or masses palpated... EXT: s/p bilat TKR's, mod arthritic changes; no varicose veins/ +venous insuffic/ no edema. NEURO:  CN's intact;  gait abn; no focal neuro deficits... DERM:  No lesions noted; no rash etc...    MISC. Report  Procedure date:  03/15/2010  Findings:      DATA REVIEWED:   ~  Prev EMR notes...  ~  [facility] Summaries from 2/11 & 4/11...  ~  XRays and  CT Scans from 2/11 & 6/11...  ~  Lab data, etc...   ~  Discussed w/ IR re: lung biopsy...   Impression & Recommendations:  Problem # 1:  PULMONARY NODULE (ICD-518.89) He has a complex lesion w/ COPD/ Emphysema, recent RLL pneumonia, Bullae & 6cm RLL  cavitary area & more solid component inferiorly... we discussed f/u labs & Tumor markers (CEA= 7.5, Ca 19-9=10.8 ), ?proceed w/ needle bx as he is off the Coumadin due to hemoptysis... Continue Advair, Spiriva, Mucinex, and add Augmentin... ** Discussed w/ IR, DrSchick, & he feels pt too hi risk for complic w/ bx (pneumothorax etc)... he prefers continue Rx & f/u CXR/ scans...  Orders: T-CA 19-9 ([address]-60454) TLB-CBC Platelet - w/Differential (85025-CBCD) TLB-BMP (Basic Metabolic Panel-BMET) (80048-METABOL) TLB-Hepatic/Liver Function Pnl (80076-HEPATIC) TLB-CEA (Carcinoembryonic Antigen) (82378-CEA) TLB-PT (Protime) (85610-PTP) TLB-Sedimentation Rate (ESR) (85652-ESR)  Problem # 2:  HYPERTENSION (ICD-401.9) BP controlled>  continue same meds. His updated medication list for this problem includes:    Atenolol 50 Mg Tabs (Atenolol) .[name]... Take 1 tab by mouth once daily...    Losartan Potassium 100 Mg Tabs (Losartan potassium) .[name]... Take 1 tab by mouth once daily...    Lasix 20 Mg Tabs (Furosemide) .[name]... Take 2 tabs by mouth each am...  Problem # 3:  ATRIAL FIBRILLATION (ICD-427.31) Coumadin on HOLD now due to hemoptysis.[name].  since we are not going to bx> restart Coumdin via CC. His updated medication list for this problem includes:    Warfarin Sodium 2.5 Mg Tabs (Warfarin sodium) .[name]... *** hold ***    Adult Aspirin Ec Low Strength 81 Mg Tbec (Aspirin) ..... Once daily    Atenolol 50 Mg Tabs (Atenolol) .[name]... Take 1 tab by mouth once daily...  Problem # 4:  PERIPHERAL VASCULAR DISEASE (ICD-443.9) Known ThorAA being follwed by DrGearhardt & [facility] Cards...  Problem # 5:  HYPERCHOLESTEROLEMIA (ICD-272.0) Stable on the Lip10... His updated  medication list for this problem includes:    Lipitor 10 Mg Tabs (Atorvastatin calcium) .[name]... Take 1 tab by mouth once daily...  Problem # 6:  PROSTATE CANCER (ICD-185) Stable & followed by DrWrenn...  Problem # 7:  DEGENERATIVE JOINT DISEASE (ICD-715.90) He is s/p bilat TKR's...  His updated medication list for this problem includes:    Adult Aspirin Ec Low Strength 81 Mg Tbec (Aspirin) ..... Once daily  Problem # 8:  OTHER MEDICAL PROBLEMS AS NOTED>>>  Complete Medication List: 1)  Advair Diskus 250-50 Mcg/dose Aepb (Fluticasone-salmeterol) .[name].. 1 inhalation two times a day... 2)  Spiriva Handihaler 18 Mcg Caps (Tiotropium bromide monohydrate) .... Inhale contents of one capsule by handihaler daily 3)  Warfarin Sodium 2.5 Mg Tabs (Warfarin sodium) .... *** hold *** 4)  Adult Aspirin Ec Low Strength 81 Mg Tbec (Aspirin) .... Once daily 5)  Atenolol 50 Mg Tabs (Atenolol) .... Take 1 tab by mouth once daily.Marland KitchenMarland Kitchen 6)  Losartan Potassium 100 Mg Tabs (Losartan potassium) .... Take 1 tab by mouth once daily.Marland KitchenMarland Kitchen 7)  Lasix 20 Mg Tabs (Furosemide) .... Take 2 tabs by mouth each am... 8)  Klor-con 10 10 Meq Cr-tabs (Potassium chloride) .... Take 1 tablet by mouth once a day 9)  Lipitor 10 Mg Tabs (Atorvastatin calcium) .... Take 1 tab by mouth once daily... 10)  Multivitamins Tabs (Multiple vitamin) .... Take 1 tablet by mouth once a day 11)  Mucinex 600 Mg Xr12h-tab (Guaifenesin) .... Take 2 tabs by mouth two times a day w/ plenty of fluids... 12)  Augmentin 875-125 Mg Tabs (Amoxicillin-pot clavulanate) .... Take 1 tab by mouth two times a day...  Patient Instructions: 1)  Today we updated your med list- see below.... 2)  We discussed continuing the ADVAIR & SPIRIVA regularly... 3)  Add the MUCINEX (Guaifenesin) 1200mg  two times a day w/ plenty of fluids.Marland KitchenMarland Kitchen  4)  Add the AUGMENTIN antibiotic twice daily for now... 5)  Cough & deep breathe trying to clear the phlegm... 6)  Today we did some f/u  blood work, and we will arrange for a needle biopsy of the spot on your right lung... 7)  We will call you w/ the results when available & then decide on further treatment... 8)  Call for any questions... Prescriptions: AUGMENTIN 875-125 MG TABS (AMOXICILLIN-POT CLAVULANATE) take 1 tab by mouth two times a day...  #28 x 0   Entered and Authorized by:   Michele Mcalpine MD   Signed by:   Michele Mcalpine MD on 03/15/2010   Method used:   Print then Give to Patient   RxID:   [id number]

## 2010-10-25 NOTE — Letter (Signed)
Summary: Colonoscopy-Changed to Office Visit Letter   Gastroenterology  [address] [place], [place] [address]   Phone: [phone]  Fax: [phone]      May 05, 2010 MRN: [id number]   KAREN KINNARD [address] [place], [place]  [address]   Dear Mr. Sponsel,   According to our records, it is time for you to schedule a Colonoscopy. However, after reviewing your medical record, I feel that an office visit would be most appropriate to more completely evaluate you and determine your need for a repeat procedure.  Please call [phone] (option #2) at your convenience to schedule an office visit. If you have any questions, concerns, or feel that this letter is in error, we would appreciate your call.   Sincerely,  Hedwig Morton. Juanda Chance, M.D.  [facility] [phone]

## 2010-10-25 NOTE — Assessment & Plan Note (Signed)
Summary: eph. gd   History of Present Illness: Ryan Hess is seen today for F/ U of afib.  he was recently hospitalized for CHF/  He had a normal cath with EF around 40%.  We attempted a TEE [facility] but he had LAA clot.  I reviewed his INR chart and he has been Rx for 4 weeks.  We discussed a repeat TEE with possible [facility] if the clot is gone and he is in agreement.  We will decrease his Atenolol today and have him hold it the day of the procedure since he is bradycardic  His dyspnea is improved and he has no SSCP or palpitations.  Risk of procedure including stroke discussed.    Current Problems (verified): 1)  Dyspnea  (ICD-786.05) 2)  Weakness  (ICD-780.79) 3)  COPD  (ICD-496) 4)  Hypertension  (ICD-401.9) 5)  Congestive Heart Failure  (ICD-428.0) 6)  Atrial Fibrillation  (ICD-427.31) 7)  Premature Ventricular Contractions, Frequent  (ICD-427.69) 8)  Peripheral Vascular Disease  (ICD-443.9) 9)  Thoracic Aortic Aneurysm  (ICD-441.2) 10)  Hypercholesterolemia  (ICD-272.0) 11)  Diverticulosis of Colon  (ICD-562.10) 12)  Colonic Polyps  (ICD-211.3) 13)  Prostate Cancer  (ICD-185) 14)  Degenerative Joint Disease  (ICD-715.90) 15)  Actinic Skin Damage  (ICD-692.70)  Current Medications (verified): 1)  Advair Diskus 250-50 Mcg/dose Aepb (Fluticasone-Salmeterol) .[name].. 1 Inhalation Two Times A Day... 2)  Spiriva Handihaler 18 Mcg Caps (Tiotropium Bromide Monohydrate) .... Inhale Contents of One Capsule By Handihaler Daily 3)  Warfarin Sodium 2.5 Mg Tabs (Warfarin Sodium) .... Take As Directed 4)  Adult Aspirin Ec Low Strength 81 Mg  Tbec (Aspirin) .... Once Daily 5)  Atenolol 50 Mg  Tabs (Atenolol) .... Take 1 Tab By Mouth Once Daily.Marland KitchenMarland Kitchen 6)  Losartan Potassium 100 Mg Tabs (Losartan Potassium) .... Take 1 Tab By Mouth Once Daily.Marland KitchenMarland Kitchen 7)  Lasix 20 Mg Tabs (Furosemide) .... Take 2 Tabs By Mouth Each Am... 8)  Klor-Con 10 10 Meq Cr-Tabs (Potassium Chloride) .... Take 1 Tablet By Mouth Once A Day 9)  Lipitor  10 Mg  Tabs (Atorvastatin Calcium) .... Take 1 Tab By Mouth Once Daily... 10)  Multivitamins  Tabs (Multiple Vitamin) .... Take 1 Tablet By Mouth Once A Day  Allergies (verified): No Known Drug Allergies  Past History:  Past Medical History: Last updated: 11/16/2009  COPD (ICD-496) HYPERTENSION (ICD-401.9) CONGESTIVE HEART FAILURE (ICD-428.0) ATRIAL FIBRILLATION (ICD-427.31) PREMATURE VENTRICULAR CONTRACTIONS, FREQUENT (ICD-427.69) PERIPHERAL VASCULAR DISEASE (ICD-443.9) THORACIC AORTIC ANEURYSM (ICD-441.2) HYPERCHOLESTEROLEMIA (ICD-272.0) DIVERTICULOSIS OF COLON (ICD-562.10) COLONIC POLYPS (ICD-211.3) PROSTATE CANCER (ICD-185) DEGENERATIVE JOINT DISEASE (ICD-715.90) ACTINIC SKIN DAMAGE (ICD-692.70)  Past Surgical History: Last updated: 11/16/2009 S/P bilat TKR's S/P Abdominal Aortic Aneurysm Repair w/ Ao Bi-iliac graft in 1998 S/P appendectomy & left inguinal hernia repair  Family History: Last updated: 09-Apr-2009 mother died at age 59 w/ senile dementia father died at age 58 from a stroke 6 siblings: 2 Bothers died- one age 62 w/ DM, vasc dis, heart dis; one age 82 w/ obesity & heart problems... 4 Sisters died- alzheimer's, heart dis, cancer ("in her back" ?myeloma)...  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 All other systems reviewed and negative  Vital Signs:  Patient profile:   77 year old male Height:      69 inches Weight:      200 pounds Pulse rate:   105 / minute Pulse rhythm:   irregularly irregular Resp:     14 per minute BP sitting:   120 / 75  (left arm)  Vitals Entered By:  Kem Parkinson (December 16, 2009 3:15 PM)  Physical Exam  General:  Affect appropriate Healthy:  appears stated age HEENT: normal Neck supple with no adenopathy JVP normal no bruits no thyromegaly Lungs clear with no wheezing and good diaphragmatic motion Heart:  S1/S2 no murmur,rub, gallop or click PMI normal Abdomen: benighn, BS positve, no tenderness, no AAA no bruit.  No HSM or HJR Distal pulses intact with no bruits No edema Neuro non-focal Skin warm and dry    Impression & Recommendations:  Problem # 1:  DYSPNEA (ICD-786.05) improved.  Multifactorial with some CHF and arrythmia His updated medication list for this problem includes:    Adult Aspirin Ec Low Strength 81 Mg Tbec (Aspirin) ..... Once daily    Atenolol 50 Mg Tabs (Atenolol) .[name]... Take 1 tab by mouth once daily...    Losartan Potassium 100 Mg Tabs (Losartan potassium) .[name]... Take 1 tab by mouth once daily...    Lasix 20 Mg Tabs (Furosemide) .[name]... Take 2 tabs by mouth each am...  Problem # 2:  HYPERTENSION (ICD-401.9) Well controlled His updated medication list for this problem includes:    Adult Aspirin Ec Low Strength 81 Mg Tbec (Aspirin) ..... Once daily    Atenolol 50 Mg Tabs (Atenolol) .[name]... Take 1 tab by mouth once daily...    Losartan Potassium 100 Mg Tabs (Losartan potassium) .[name]... Take 1 tab by mouth once daily...    Lasix 20 Mg Tabs (Furosemide) .[name]... Take 2 tabs by mouth each am...  Problem # 5:  ATRIAL FIBRILLATION (ICD-427.31) LAA clot by TEE.  INR's Rx for 4 weeks.  F/U TEE with [facility] if clot gone His updated medication list for this problem includes:    Warfarin Sodium 2.5 Mg Tabs (Warfarin sodium) .[name]... Take as directed    Adult Aspirin Ec Low Strength 81 Mg Tbec (Aspirin) ..... Once daily    Atenolol 50 Mg Tabs (Atenolol) .[name]... Take 1 tab by mouth once daily...  Orders: Trans Esophageal Echo Cardioversion (TEE-Cardioversion)  Problem # 6:  PREMATURE VENTRICULAR CONTRACTIONS, FREQUENT  (ICD-427.69) No CAD at cath and EF >35%.  Continue BB His updated medication list for this problem includes:    Warfarin Sodium 2.5 Mg Tabs (Warfarin sodium) .[name]... Take as directed    Adult Aspirin Ec Low Strength 81 Mg Tbec (Aspirin) ..... Once daily    Atenolol 50 Mg Tabs (Atenolol) .[name]... Take 1 tab by mouth once daily...  Patient Instructions: 1)  Your physician has requested that you have a TEE.  During a TEE, sound waves are used to create images of your heart. It provides your doctor with information about the size and shape of your heart and how well your heart's chambers and valves are working. In this test, a transducer is attached to the end of a flexible tube that's guided down your throat and into your esophagus (the tube leading from your mouth to your stomach) to get a more detailed image of your heart. You are not awake for the procedure. Please see the instruction sheet given to you today.  For further information please visit [email or web address] 2)  Your physician has recommended that  you have a cardioversion (DCCV).  Electrical cardioversion uses a jolt of electricity to your heart either through paddles or wired patches attached to your chest. This is a controlled, usually prescheduled, procedure. Defibrillation is done under light anesthesia in the hospital, and you usually go home the day of the procedure. This is done to get your heart back into a normal rhythm. You are not awake for the procedure. Please see the instruction sheet given to you today. 3)  Your physician recommends that you return for lab work on Friday, December 23, 2009 BMP and PT/INR dx 427.31 4)  Your physician recommends that you continue on your current medications as directed. Please refer to the Current Medication list given to you today. 5)  Your physician recommends that you schedule a follow-up appointment in: 2-3 weeks with Dr [name].

## 2010-10-25 NOTE — Assessment & Plan Note (Signed)
Summary: ROV/BRADY   CC:  sob.  History of Present Illness: Ryan Hess is seen today at the request of Tammy Parret.  He has had increasing SOB with signs of CHF.  His BNP was over 500.  He has had a myovue in 2008 with EF 45% and no ischemia.  He has vascular disease with previous AAA and Aobifem.  Apparantly there is a residual thoracic aneurysm that is being followed by Dr Tyrone Sage.  He was started on Lasix 20mg  with good results and slept well last night.  He has significant COPD and is followed in the pulmonary department regularly.  He denies fever cough SSCP palpitations or diaphoresis.  There is no documented CAD and he has not presented with CHF before.  He tends towards bradycardia and has had PVC's in the past  Current Problems (verified): 1)  Dyspnea  (ICD-786.05) 2)  Weakness  (ICD-780.79) 3)  COPD  (ICD-496) 4)  Hypertension  (ICD-401.9) 5)  Premature Ventricular Contractions, Frequent  (ICD-427.69) 6)  Peripheral Vascular Disease  (ICD-443.9) 7)  Thoracic Aortic Aneurysm  (ICD-441.2) 8)  Hypercholesterolemia  (ICD-272.0) 9)  Diverticulosis of Colon  (ICD-562.10) 10)  Colonic Polyps  (ICD-211.3) 11)  Prostate Cancer  (ICD-185) 12)  Degenerative Joint Disease  (ICD-715.90) 13)  Actinic Skin Damage  (ICD-692.70)  Current Medications (verified): 1)  Adult Aspirin Ec Low Strength 81 Mg  Tbec (Aspirin) .... Once Daily 2)  Atenolol 50 Mg  Tabs (Atenolol) .... Take 1 Tab By Mouth Once Daily.Marland KitchenMarland Kitchen 3)  Norvasc 10 Mg  Tabs (Amlodipine Besylate) .... Take 1/2 Tab By Mouth Once Daily.Marland KitchenMarland Kitchen 4)  Losartan Potassium 100 Mg Tabs (Losartan Potassium) .... Take 1 Tab By Mouth Once Daily.Marland KitchenMarland Kitchen 5)  Lipitor 10 Mg  Tabs (Atorvastatin Calcium) .... Take 1 Tab By Mouth Once Daily.Marland KitchenMarland Kitchen 6)  Multivitamins  Tabs (Multiple Vitamin) .... Take 1 Tablet By Mouth Once A Day 7)  Lasix 20 Mg Tabs (Furosemide) .[name].. 1 By Mouth Once Daily As Needed Leg Swelling  Allergies (verified): No Known Drug Allergies  Past  History:  Past Medical History: Last updated: 10/25/2009 COPD (ICD-496) - he is an ex-smoker, having quit in 1990 after 40 yrs of smoking... he has been exercising regularly and walking daily  ~84mi 5-6 days per week...  ~  baseline CXR & CTChest w/ marked emphysema, atheromatous calcif, 5cm desc thorAA...   ~  6/10: denies cough, sputum, hemoptysis, worsening dyspnea, wheezing, chest pains, snoring, daytime hypersomnolence, etc...   HYPERTENSION (ICD-401.9) - on ATENOLOL 50mg /d, NORVASC 10mg /d, & ATACAND 8mg /d... BP= 142/84 and feeling well... home BP checks are "all good" w/ high= 140... hx ACE cough in the past on Lisinopril... denies HA, fatigue, visual changes, CP, palipit, syncope, dyspnea, edema, etc...  ~  NuclearStressTest 1/05 was neg- no ischemia or infarct (+diaphrag attenuation), EF=56%...  ~  repeat Nuclear study 6/09 was neg- no ischemia, mild inferoapic thinning, not gated due to PVCs...  ~  2DEcho 6/09 showed mild dilated LV w/ EF= 45-50% but no regional wall motion abn, mild AoV calcif & AI, LA mild dil...  ~  12/10:  changed from Atacand to LOSARTAN to save $$  PREMATURE VENTRICULAR CONTRACTIONS, FREQUENT (ICD-427.69) - followed by Walker Kehr a   PERIPHERAL VASCULAR DISEASE (ICD-443.9) - on ASA 81mg /d... he is s/p infrarenal AAA repair w/ right common iliac aneurysm repair (via Ao Bi-iliac graft) in 1998 by DrLawson... also has a known desc thor AA measuring  ~5cm and followed by DrGearhardt every 18 months...   ~  seen by DrGearhart 4/10 w/ CT scan showing  ~5cm thoracoabdominal aneurysm w/o signif change... f/u planned in another 52mo...  HYPERCHOLESTEROLEMIA (ICD-272.0) - on LIPITOR 10mg /d...   ~  FLP 5/07 showed TChol 115, Tg 58, HDL 36, LDL 67  ~  FLP 5/08 showed TChol 118, TG 66, HDL 31, LDL 74  ~  FLP 5/09 showed TChol 126, TG 71, HDL 37, LDL 75  ~  FLP 6/10 showed TChol 116, TG 73, HDL 40, LDL 62  DIVERTICULOSIS OF COLON (ICD-562.10) & COLONIC POLYPS (ICD-211.3) - hx  polyps in 2003 = tubular adenoma...    Past Surgical History: Last updated: 09/13/2009 S/P bilat TKR's...  S/P Abdominal Aortic Aneurysm Repair w/ Ao Bi-iliac graft in 1998 S/P appendectomy & left inguinal hernia repair  Family History: Last updated: 03-23-2009 mother died at age 4 w/ senile dementia father died at age 42 from a stroke 6 siblings: 2 Bothers died- one age 43 w/ DM, vasc dis, heart dis; one age 78 w/ obesity & heart problems... 4 Sisters died- alzheimer's, heart dis, cancer ("in her back" ?myeloma)...  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 All other systems reviewed and negative  Vital Signs:  Patient profile:   77 year old male Height:      69 inches Weight:      211 pounds Pulse rate:   55 / minute Resp:     14 per minute BP sitting:   126 / 77  (left arm)  Vitals Entered By: Kem Parkinson (October 26, 2009 9:38 AM)  Physical Exam  General:  Affect appropriate Healthy:  appears stated age HEENT: normal Neck supple with no adenopathy JVP normal no bruits no thyromegaly Lungs scattered rhonchi no wheezing and good diaphragmatic motion Heart:  S1/S2 no murmur,rub, gallop or click PMI normal Abdomen: benighn, BS positve, no tenderness, no AAA repair scar no bruit.  No HSM or HJR Distal pulses intact with no bruits Trace edema Neuro non-focal Skin warm and dry    Impression & Recommendations:  Problem # 1:  DYSPNEA (ICD-786.05) New onset CHF in addition to COPD.  Continue Lasix.  Check echo.  If marked change in EF consdier Right and Left cath. His updated medication list for  this problem includes:    Adult Aspirin Ec Low Strength 81 Mg Tbec (Aspirin) ..... Once daily    Atenolol 50 Mg Tabs (Atenolol) .[name]... Take 1 tab by mouth once daily...    Norvasc 10 Mg Tabs (Amlodipine besylate) .[name]... Take 1/2 tab by mouth once daily...    Losartan Potassium 100 Mg Tabs (Losartan potassium) .[name]... Take 1 tab by mouth once daily...    Lasix 20 Mg Tabs (Furosemide) .[name]... 1 by mouth once daily as needed leg swelling  Orders: Echocardiogram (Echo)  Problem # 2:  HYPERTENSION (ICD-401.9) Well controlled His updated medication list for this problem includes:    Adult Aspirin Ec Low Strength 81 Mg Tbec (Aspirin) ..... Once daily    Atenolol 50 Mg Tabs (Atenolol) .[name]... Take 1 tab by mouth once daily...    Norvasc 10 Mg Tabs (Amlodipine besylate) .[name]... Take 1/2 tab by mouth once daily...    Losartan Potassium  100 Mg Tabs (Losartan potassium) .[name]... Take 1 tab by mouth once daily...    Lasix 20 Mg Tabs (Furosemide) .[name]... 1 by mouth once daily as needed leg swelling  Problem # 3:  PREMATURE VENTRICULAR CONTRACTIONS, FREQUENT (ICD-427.69) Asymptomatic.  Significance will depend on EF.  Echo Continue BB His updated medication list for this problem includes:    Adult Aspirin Ec Low Strength 81 Mg Tbec (Aspirin) ..... Once daily    Atenolol 50 Mg Tabs (Atenolol) .[name]... Take 1 tab by mouth once daily...    Norvasc 10 Mg Tabs (Amlodipine besylate) .[name]... Take 1/2 tab by mouth once daily...  Problem # 4:  PERIPHERAL VASCULAR DISEASE (ICD-443.9) F/U with Dr Tyrone Sage.  No AR on exam and at his age would more likely be a stent graft candidate  Patient Instructions: 1)  Your physician recommends that you schedule a follow-up appointment in: ONE WEEK 2)  Your physician has requested that you have an echocardiogram.  Echocardiography is a painless test that uses sound waves to create images of your heart. It provides your doctor with information about the size and shape of your heart and how well  your heart's chambers and valves are working.  This procedure takes approximately one hour. There are no restrictions for this procedure.SCHEDULE SAMEDAY AS APPT WITH Diaz Crago

## 2010-10-25 NOTE — Medication Information (Signed)
Summary: rov/ewj  Anticoagulant Therapy  Managed by: Bethena Midget, RN, BSN Referring MD: [name] MD, Lavera Guise MD: Myrtis Ser MD, Tinnie Gens Indication 1: Atrial Fibrillation Lab Used: [facility] [place] Site: [address] INR POC 2.3 INR RANGE 2.0-3.0  Dietary changes: no    Health status changes: no    Bleeding/hemorrhagic complications: no    Recent/future hospitalizations: no    Any changes in medication regimen? no    Recent/future dental: no  Any missed doses?: no       Is patient compliant with meds? yes      Comments: Poss. DCCV, sees Dr. [name] on 12/16/09  Allergies: No Known Drug Allergies  Anticoagulation Management History:      The patient is taking warfarin and comes in today for a routine follow up visit.  Positive risk factors for bleeding include an age of 75 years or older.  The bleeding index is 'intermediate risk'.  Positive CHADS2 values include History of CHF, History of HTN, and Age > 65 years old.  His last INR was 12.6 ratio.  Anticoagulation responsible provider: Myrtis Ser MD, Tinnie Gens.  INR POC: 2.3.  Cuvette Lot#: [id number].  Exp: 01/2011.    Anticoagulation Management Assessment/Plan:      The patient's current anticoagulation dose is Warfarin sodium 2.5 mg tabs: take as directed.  The target INR is 2.0-3.0.  The next INR is due 12/16/2009.  Anticoagulation instructions were given to patient.  Results were reviewed/authorized by Bethena Midget, RN, BSN.  He was notified by Bethena Midget, RN, BSN.         Prior Anticoagulation Instructions: INR 2.4  Continue on same dosage 1 tablet daily except 1/2 tablet on Sundays and Fridays.  Recheck in 1 week.    Current Anticoagulation Instructions: INR 2.3 Continue  1 pill everyday except 1/2 pill on Sundays and Fridays. Recheck in one week.

## 2010-10-27 NOTE — Medication Information (Signed)
Summary: rov/tm  Anticoagulant Therapy  Managed by: Weston Brass, PharmD Referring MD: [name] MD, Lavera Guise MD: Elease Hashimoto, MD Indication 1: Atrial Fibrillation Lab Used: [facility] [place] Site: [address] INR POC 2.8 INR RANGE 2.0-3.0  Dietary changes: yes       Details: Extra Vit K this week   Health status changes: no    Bleeding/hemorrhagic complications: yes       Details: Nose bleed this morning lasting <10 minutes   Recent/future hospitalizations: no    Any changes in medication regimen? no    Recent/future dental: no  Any missed doses?: no       Is patient compliant with meds? yes       Allergies: No Known Drug Allergies  Anticoagulation Management History:      The patient is taking warfarin and comes in today for a routine follow up visit.  Positive risk factors for bleeding include an age of 75 years or older.  The bleeding index is 'intermediate risk'.  Positive CHADS2 values include History of CHF, History of HTN, and Age > 1 years old.  His last INR was 1.4 ratio.  Anticoagulation responsible provider: Nahser, MD.  INR POC: 2.8.  Cuvette Lot#: [id number].  Exp: 10/2011.    Anticoagulation Management Assessment/Plan:      The patient's current anticoagulation dose is Warfarin sodium 2.5 mg tabs: as directed  by the coumadin clinic.  The target INR is 2.0-3.0.  The next INR is due 11/01/2010.  Anticoagulation instructions were given to patient.  Results were reviewed/authorized by Weston Brass, PharmD.  He was notified by Stephannie Peters, PharmD Candidate .         Prior Anticoagulation Instructions: INR 3.3 Skip today's dose, then resume 2.5mg s daily except 5mg s on Fridays. Recheck in 3 weeks.   Current Anticoagulation Instructions: INR 2.8  Coumadin 2.5 mg tablets - Continue 1 tablet every day except 2 tablets on Friday

## 2010-10-27 NOTE — Medication Information (Signed)
Summary: rov/sp  Anticoagulant Therapy  Managed by: Bethena Midget, RN, BSN Referring MD: [name] MD, Lavera Guise MD: Patty Sermons MD Indication 1: Atrial Fibrillation Lab Used: [facility] [place] Site: [address] INR POC 3.3 INR RANGE 2.0-3.0  Dietary changes: no    Health status changes: no    Bleeding/hemorrhagic complications: no    Recent/future hospitalizations: no    Any changes in medication regimen? no    Recent/future dental: no  Any missed doses?: no       Is patient compliant with meds? yes       Allergies: No Known Drug Allergies  Anticoagulation Management History:      The patient is taking warfarin and comes in today for a routine follow up visit.  Positive risk factors for bleeding include an age of 75 years or older.  The bleeding index is 'intermediate risk'.  Positive CHADS2 values include History of CHF, History of HTN, and Age > 26 years old.  His last INR was 1.4 ratio.  Anticoagulation responsible provider: Larkin Alfred MD.  INR POC: 3.3.  Cuvette Lot#: [id number].  Exp: 09/2011.    Anticoagulation Management Assessment/Plan:      The patient's current anticoagulation dose is Warfarin sodium 2.5 mg tabs: as directed  by the coumadin clinic.  The target INR is 2.0-3.0.  The next INR is due 10/04/2010.  Anticoagulation instructions were given to patient.  Results were reviewed/authorized by Bethena Midget, RN, BSN.  He was notified by Bethena Midget, RN, BSN.         Prior Anticoagulation Instructions: INR 2.5  Stop Lovenox.  Continue same dose of Coumadin- 1 tablet every day except 2 tablets on Friday.  Recheck INR in 2 weeks.   Current Anticoagulation Instructions: INR 3.3 Skip today's dose, then resume 2.5mg s daily except 5mg s on Fridays. Recheck in 3 weeks.

## 2010-11-01 ENCOUNTER — Encounter (INDEPENDENT_AMBULATORY_CARE_PROVIDER_SITE_OTHER): Payer: Medicare Other

## 2010-11-01 ENCOUNTER — Encounter: Payer: Self-pay | Admitting: Cardiology

## 2010-11-01 DIAGNOSIS — Z7901 Long term (current) use of anticoagulants: Secondary | ICD-10-CM

## 2010-11-01 DIAGNOSIS — I4891 Unspecified atrial fibrillation: Secondary | ICD-10-CM

## 2010-11-01 LAB — CONVERTED CEMR LAB: POC INR: 3.7

## 2010-11-10 NOTE — Medication Information (Signed)
Summary: Coumadin Clinic  Anticoagulant Therapy  Managed by: Windell Hummingbird, RN Referring MD: [name] MD, Lavera Guise MD: Jens Som MD, Arlys John Indication 1: Atrial Fibrillation Lab Used: [facility] [place] Site: [address] INR POC 3.7 INR RANGE 2.0-3.0  Dietary changes: no    Health status changes: no    Bleeding/hemorrhagic complications: no    Recent/future hospitalizations: no    Any changes in medication regimen? no    Recent/future dental: no  Any missed doses?: no       Is patient compliant with meds? yes       Allergies: No Known Drug Allergies  Anticoagulation Management History:      The patient is taking warfarin and comes in today for a routine follow up visit.  Positive risk factors for bleeding include an age of 75 years or older.  The bleeding index is 'intermediate risk'.  Positive CHADS2 values include History of CHF, History of HTN, and Age > 75 years old.  His last INR was 1.4 ratio.  Anticoagulation responsible provider: Jens Som MD, Arlys John.  INR POC: 3.7.  Cuvette Lot#: [id number].  Exp: 10/2011.    Anticoagulation Management Assessment/Plan:      The patient's current anticoagulation dose is Warfarin sodium 2.5 mg tabs: Use as directed by Anticoagualtion Clinic.  The target INR is 2.0-3.0.  The next INR is due 11/15/2010.  Anticoagulation instructions were given to patient.  Results were reviewed/authorized by Windell Hummingbird, RN.  He was notified by Windell Hummingbird, RN.         Prior Anticoagulation Instructions: INR 2.8  Coumadin 2.5 mg tablets - Continue 1 tablet every day except 2 tablets on Friday   Current Anticoagulation Instructions: INR 3.7 Skip today's dose. Then start taking 1 tablet every day. Recheck in 2 weeks.

## 2010-11-15 ENCOUNTER — Encounter: Payer: Self-pay | Admitting: Cardiovascular Disease

## 2010-11-15 ENCOUNTER — Encounter (INDEPENDENT_AMBULATORY_CARE_PROVIDER_SITE_OTHER): Payer: Medicare Other

## 2010-11-15 DIAGNOSIS — I4891 Unspecified atrial fibrillation: Secondary | ICD-10-CM

## 2010-11-15 DIAGNOSIS — Z7901 Long term (current) use of anticoagulants: Secondary | ICD-10-CM

## 2010-11-15 LAB — CONVERTED CEMR LAB: POC INR: 2.7

## 2010-11-21 ENCOUNTER — Encounter: Payer: Self-pay | Admitting: Pulmonary Disease

## 2010-11-22 NOTE — Medication Information (Signed)
Summary: rov/pc  Anticoagulant Therapy  Managed by: Weston Brass, PharmD Referring MD: [name] MD, Lavera Guise MD: Excell Seltzer MD, Casimiro Needle Indication 1: Atrial Fibrillation Lab Used: [facility] [place] Site: [address] INR POC 2.7 INR RANGE 2.0-3.0  Dietary changes: no    Health status changes: no    Bleeding/hemorrhagic complications: no    Recent/future hospitalizations: no    Any changes in medication regimen? yes       Details: patient started on an antibiotic about a week ago and is taking it 4 times a day.  He is not sure of the name of it but does know that it is 500mg  and 4 times a day. He also thinks the bottle had 50 capsules to start with in it so we think he has 3-4 days left.   Recent/future dental: no  Any missed doses?: no       Is patient compliant with meds? yes       Allergies: No Known Drug Allergies  Anticoagulation Management History:      The patient is taking warfarin and comes in today for a routine follow up visit.  Positive risk factors for bleeding include an age of 14 years or older.  The bleeding index is 'intermediate risk'.  Positive CHADS2 values include History of CHF, History of HTN, and Age > 48 years old.  His last INR was 1.4 ratio.  Anticoagulation responsible provider: Excell Seltzer MD, Casimiro Needle.  INR POC: 2.7.  Cuvette Lot#: [id number].  Exp: 09/2011.    Anticoagulation Management Assessment/Plan:      The patient's current anticoagulation dose is Warfarin sodium 2.5 mg tabs: Use as directed by Anticoagualtion Clinic.  The target INR is 2.0-3.0.  The next INR is due 12/06/2010.  Anticoagulation instructions were given to patient.  Results were reviewed/authorized by Weston Brass, PharmD.  He was notified by Margot Chimes PharmD Candidate.         Prior Anticoagulation Instructions: INR 3.7 Skip today's dose. Then start taking 1 tablet every day. Recheck in 2 weeks.  Current Anticoagulation Instructions: INR 2.7  Continue  to take 1 tablet everyday. Recheck INR in 3 weeks.

## 2010-12-06 ENCOUNTER — Encounter (INDEPENDENT_AMBULATORY_CARE_PROVIDER_SITE_OTHER): Payer: Medicare Other

## 2010-12-06 ENCOUNTER — Encounter: Payer: Self-pay | Admitting: Cardiovascular Disease

## 2010-12-06 DIAGNOSIS — I4891 Unspecified atrial fibrillation: Secondary | ICD-10-CM

## 2010-12-06 DIAGNOSIS — Z7901 Long term (current) use of anticoagulants: Secondary | ICD-10-CM

## 2010-12-06 LAB — CONVERTED CEMR LAB: POC INR: 2.4

## 2010-12-06 NOTE — Letter (Signed)
[facility]: [facility]   Imported By: Sherian Rein 11/29/2010 15:22:05  _____________________________________________________________________  External Attachment:    Type:   Image     Comment:   External Document

## 2010-12-07 ENCOUNTER — Encounter: Payer: Self-pay | Admitting: Cardiovascular Disease

## 2010-12-13 LAB — PROTIME-INR
INR: 2.25 — ABNORMAL HIGH (ref 0.00–1.49)
INR: 2.63 — ABNORMAL HIGH (ref 0.00–1.49)
Prothrombin Time: 24.7 seconds — ABNORMAL HIGH (ref 11.6–15.2)
Prothrombin Time: 27.9 seconds — ABNORMAL HIGH (ref 11.6–15.2)

## 2010-12-13 LAB — LIPID PANEL
Cholesterol: 78 mg/dL (ref 0–200)
Cholesterol: 94 mg/dL (ref 0–200)
HDL: 16 mg/dL — ABNORMAL LOW (ref >39)
HDL: 23 mg/dL — ABNORMAL LOW (ref >39)
LDL Cholesterol: 53 mg/dL (ref 0–99)
LDL Cholesterol: 61 mg/dL (ref 0–99)
Total CHOL/HDL Ratio: 4.1 RATIO
Total CHOL/HDL Ratio: 4.9 RATIO
Triglycerides: 45 mg/dL (ref <150)
Triglycerides: 52 mg/dL (ref <150)
VLDL: 10 mg/dL (ref 0–40)
VLDL: 9 mg/dL (ref 0–40)

## 2010-12-13 LAB — URINALYSIS, ROUTINE W REFLEX MICROSCOPIC
Bilirubin Urine: NEGATIVE
Glucose, UA: NEGATIVE mg/dL
Hgb urine dipstick: NEGATIVE
Ketones, ur: NEGATIVE mg/dL
Nitrite: NEGATIVE
Protein, ur: NEGATIVE mg/dL
Specific Gravity, Urine: 1.021 (ref 1.005–1.030)
Urobilinogen, UA: 4 mg/dL — ABNORMAL HIGH (ref 0.0–1.0)
pH: 6 (ref 5.0–8.0)

## 2010-12-13 LAB — CBC
HCT: 37.7 % — ABNORMAL LOW (ref 39.0–52.0)
HCT: 41.5 % (ref 39.0–52.0)
HCT: 44.2 % (ref 39.0–52.0)
Hemoglobin: 13.1 g/dL (ref 13.0–17.0)
Hemoglobin: 14.1 g/dL (ref 13.0–17.0)
Hemoglobin: 15.4 g/dL (ref 13.0–17.0)
MCHC: 34 g/dL (ref 30.0–36.0)
MCHC: 34.7 g/dL (ref 30.0–36.0)
MCHC: 34.7 g/dL (ref 30.0–36.0)
MCV: 94.9 fL (ref 78.0–100.0)
MCV: 96.1 fL (ref 78.0–100.0)
MCV: 97 fL (ref 78.0–100.0)
Platelets: 159 10*3/uL (ref 150–400)
Platelets: 171 10*3/uL (ref 150–400)
Platelets: 205 10*3/uL (ref 150–400)
RBC: 3.92 MIL/uL — ABNORMAL LOW (ref 4.22–5.81)
RBC: 4.28 MIL/uL (ref 4.22–5.81)
RBC: 4.66 MIL/uL (ref 4.22–5.81)
RDW: 15.8 % — ABNORMAL HIGH (ref 11.5–15.5)
RDW: 15.9 % — ABNORMAL HIGH (ref 11.5–15.5)
RDW: 16 % — ABNORMAL HIGH (ref 11.5–15.5)
WBC: 10.3 10*3/uL (ref 4.0–10.5)
WBC: 11.2 10*3/uL — ABNORMAL HIGH (ref 4.0–10.5)
WBC: 13.3 10*3/uL — ABNORMAL HIGH (ref 4.0–10.5)

## 2010-12-13 LAB — COMPREHENSIVE METABOLIC PANEL
ALT: 37 U/L (ref 0–53)
ALT: 41 U/L (ref 0–53)
ALT: 54 U/L — ABNORMAL HIGH (ref 0–53)
AST: 103 U/L — ABNORMAL HIGH (ref 0–37)
AST: 63 U/L — ABNORMAL HIGH (ref 0–37)
AST: 70 U/L — ABNORMAL HIGH (ref 0–37)
Albumin: 2.2 g/dL — ABNORMAL LOW (ref 3.5–5.2)
Albumin: 2.5 g/dL — ABNORMAL LOW (ref 3.5–5.2)
Albumin: 2.9 g/dL — ABNORMAL LOW (ref 3.5–5.2)
Alkaline Phosphatase: 140 U/L — ABNORMAL HIGH (ref 39–117)
Alkaline Phosphatase: 158 U/L — ABNORMAL HIGH (ref 39–117)
Alkaline Phosphatase: 191 U/L — ABNORMAL HIGH (ref 39–117)
BUN: 17 mg/dL (ref 6–23)
BUN: 18 mg/dL (ref 6–23)
BUN: 21 mg/dL (ref 6–23)
CO2: 20 mEq/L (ref 19–32)
CO2: 23 mEq/L (ref 19–32)
CO2: 25 mEq/L (ref 19–32)
Calcium: 7.8 mg/dL — ABNORMAL LOW (ref 8.4–10.5)
Calcium: 8 mg/dL — ABNORMAL LOW (ref 8.4–10.5)
Calcium: 8.4 mg/dL (ref 8.4–10.5)
Chloride: 103 mEq/L (ref 96–112)
Chloride: 105 mEq/L (ref 96–112)
Chloride: 105 mEq/L (ref 96–112)
Creatinine, Ser: 1.09 mg/dL (ref 0.4–1.5)
Creatinine, Ser: 1.22 mg/dL (ref 0.4–1.5)
Creatinine, Ser: 1.39 mg/dL (ref 0.4–1.5)
GFR calc Af Amer: 59 mL/min — ABNORMAL LOW (ref >60)
GFR calc Af Amer: >60 mL/min (ref >60)
GFR calc Af Amer: >60 mL/min (ref >60)
GFR calc non Af Amer: 49 mL/min — ABNORMAL LOW (ref >60)
GFR calc non Af Amer: 57 mL/min — ABNORMAL LOW (ref >60)
GFR calc non Af Amer: >60 mL/min (ref >60)
Glucose, Bld: 123 mg/dL — ABNORMAL HIGH (ref 70–99)
Glucose, Bld: 83 mg/dL (ref 70–99)
Glucose, Bld: 91 mg/dL (ref 70–99)
Potassium: 4.2 mEq/L (ref 3.5–5.1)
Potassium: 4.6 mEq/L (ref 3.5–5.1)
Potassium: 4.8 mEq/L (ref 3.5–5.1)
Sodium: 134 mEq/L — ABNORMAL LOW (ref 135–145)
Sodium: 134 mEq/L — ABNORMAL LOW (ref 135–145)
Sodium: 137 mEq/L (ref 135–145)
Total Bilirubin: 3 mg/dL — ABNORMAL HIGH (ref 0.3–1.2)
Total Bilirubin: 3.2 mg/dL — ABNORMAL HIGH (ref 0.3–1.2)
Total Bilirubin: 3.3 mg/dL — ABNORMAL HIGH (ref 0.3–1.2)
Total Protein: 5.1 g/dL — ABNORMAL LOW (ref 6.0–8.3)
Total Protein: 5.7 g/dL — ABNORMAL LOW (ref 6.0–8.3)
Total Protein: 6.6 g/dL (ref 6.0–8.3)

## 2010-12-13 LAB — CK TOTAL AND CKMB (NOT AT ARMC)
CK, MB: 1.1 ng/mL (ref 0.3–4.0)
Relative Index: INVALID (ref 0.0–2.5)
Total CK: 57 U/L (ref 7–232)

## 2010-12-13 LAB — URINE CULTURE
Colony Count: NO GROWTH
Culture: NO GROWTH

## 2010-12-13 LAB — CARDIAC PANEL(CRET KIN+CKTOT+MB+TROPI)
CK, MB: 0.7 ng/mL (ref 0.3–4.0)
CK, MB: 1.2 ng/mL (ref 0.3–4.0)
Relative Index: INVALID (ref 0.0–2.5)
Relative Index: INVALID (ref 0.0–2.5)
Total CK: 52 U/L (ref 7–232)
Total CK: 52 U/L (ref 7–232)
Troponin I: 0.02 ng/mL (ref 0.00–0.06)
Troponin I: 0.03 ng/mL (ref 0.00–0.06)

## 2010-12-13 LAB — BASIC METABOLIC PANEL
BUN: 19 mg/dL (ref 6–23)
CO2: 22 mEq/L (ref 19–32)
Calcium: 8.3 mg/dL — ABNORMAL LOW (ref 8.4–10.5)
Chloride: 104 mEq/L (ref 96–112)
Creatinine, Ser: 1.19 mg/dL (ref 0.4–1.5)
GFR calc Af Amer: >60 mL/min (ref >60)
GFR calc non Af Amer: 58 mL/min — ABNORMAL LOW (ref >60)
Glucose, Bld: 105 mg/dL — ABNORMAL HIGH (ref 70–99)
Potassium: 4.3 mEq/L (ref 3.5–5.1)
Sodium: 134 mEq/L — ABNORMAL LOW (ref 135–145)

## 2010-12-13 LAB — CULTURE, RESPIRATORY W GRAM STAIN: Culture: NORMAL

## 2010-12-13 LAB — GRAM STAIN

## 2010-12-13 LAB — DIFFERENTIAL
Basophils Absolute: 0.1 10*3/uL (ref 0.0–0.1)
Basophils Relative: 0 % (ref 0–1)
Eosinophils Absolute: 0.6 10*3/uL (ref 0.0–0.7)
Eosinophils Relative: 4 % (ref 0–5)
Lymphocytes Relative: 6 % — ABNORMAL LOW (ref 12–46)
Lymphs Abs: 0.8 10*3/uL (ref 0.7–4.0)
Monocytes Absolute: 1.6 10*3/uL — ABNORMAL HIGH (ref 0.1–1.0)
Monocytes Relative: 12 % (ref 3–12)
Neutro Abs: 10.4 10*3/uL — ABNORMAL HIGH (ref 1.7–7.7)
Neutrophils Relative %: 78 % — ABNORMAL HIGH (ref 43–77)

## 2010-12-13 LAB — BRAIN NATRIURETIC PEPTIDE: Pro B Natriuretic peptide (BNP): 491 pg/mL — ABNORMAL HIGH (ref 0.0–100.0)

## 2010-12-13 LAB — TROPONIN I: Troponin I: 0.02 ng/mL (ref 0.00–0.06)

## 2010-12-13 LAB — APTT: aPTT: 40 seconds — ABNORMAL HIGH (ref 24–37)

## 2010-12-13 LAB — MAGNESIUM: Magnesium: 2.2 mg/dL (ref 1.5–2.5)

## 2010-12-13 NOTE — Medication Information (Signed)
Summary: rov/sp  Anticoagulant Therapy  Managed by: Bethena Midget, RN, BSN Referring MD: [name] MD, Lavera Guise MD: Clifton James MD, Cristal Deer Indication 1: Atrial Fibrillation Lab Used: [facility] [place] Site: [address] INR POC 2.4 INR RANGE 2.0-3.0  Dietary changes: no    Health status changes: no    Bleeding/hemorrhagic complications: no    Recent/future hospitalizations: no    Any changes in medication regimen? no    Recent/future dental: no  Any missed doses?: no       Is patient compliant with meds? yes       Allergies: No Known Drug Allergies  Anticoagulation Management History:      The patient is taking warfarin and comes in today for a routine follow up visit.  Positive risk factors for bleeding include an age of 75 years or older.  The bleeding index is 'intermediate risk'.  Positive CHADS2 values include History of CHF, History of HTN, and Age > 45 years old.  His last INR was 1.4 ratio.  Anticoagulation responsible provider: Clifton James MD, Cristal Deer.  INR POC: 2.4.  Cuvette Lot#: [id number].  Exp: 11/2011.    Anticoagulation Management Assessment/Plan:      The patient's current anticoagulation dose is Warfarin sodium 2.5 mg tabs: Use as directed by Anticoagualtion Clinic.  The target INR is 2.0-3.0.  The next INR is due 01/03/2011.  Anticoagulation instructions were given to patient.  Results were reviewed/authorized by Bethena Midget, RN, BSN.  He was notified by Bethena Midget, RN, BSN.         Prior Anticoagulation Instructions: INR 2.7  Continue to take 1 tablet everyday. Recheck INR in 3 weeks.   Current Anticoagulation Instructions: INR 2.4 Continue 1 pill everyday. Recheck in 4 weeks.

## 2010-12-14 LAB — CBC
HCT: 47 % (ref 39.0–52.0)
Hemoglobin: 16 g/dL (ref 13.0–17.0)
MCHC: 34.1 g/dL (ref 30.0–36.0)
MCV: 96.8 fL (ref 78.0–100.0)
Platelets: 155 10*3/uL (ref 150–400)
RBC: 4.86 MIL/uL (ref 4.22–5.81)
RDW: 15.7 % — ABNORMAL HIGH (ref 11.5–15.5)
WBC: 7.5 10*3/uL (ref 4.0–10.5)

## 2010-12-14 LAB — PROTIME-INR
INR: 2.29 — ABNORMAL HIGH (ref 0.00–1.49)
Prothrombin Time: 25 seconds — ABNORMAL HIGH (ref 11.6–15.2)

## 2010-12-14 LAB — APTT: aPTT: 38 seconds — ABNORMAL HIGH (ref 24–37)

## 2010-12-15 LAB — POCT I-STAT 3, VENOUS BLOOD GAS (G3P V)
Acid-base deficit: 2 mmol/L (ref 0.0–2.0)
Bicarbonate: 23.1 mEq/L (ref 20.0–24.0)
Bicarbonate: 24.8 mEq/L — ABNORMAL HIGH (ref 20.0–24.0)
O2 Saturation: 60 %
O2 Saturation: 63 %
TCO2: 24 mmol/L (ref 0–100)
TCO2: 26 mmol/L (ref 0–100)
pCO2, Ven: 40.5 mmHg — ABNORMAL LOW (ref 45.0–50.0)
pCO2, Ven: 41.5 mmHg — ABNORMAL LOW (ref 45.0–50.0)
pH, Ven: 7.364 — ABNORMAL HIGH (ref 7.250–7.300)
pH, Ven: 7.384 — ABNORMAL HIGH (ref 7.250–7.300)
pO2, Ven: 32 mmHg (ref 30.0–45.0)
pO2, Ven: 33 mmHg (ref 30.0–45.0)

## 2010-12-15 LAB — HEPARIN LEVEL (UNFRACTIONATED)
Heparin Unfractionated: 0.25 IU/mL — ABNORMAL LOW (ref 0.30–0.70)
Heparin Unfractionated: 0.28 IU/mL — ABNORMAL LOW (ref 0.30–0.70)
Heparin Unfractionated: 0.32 IU/mL (ref 0.30–0.70)
Heparin Unfractionated: 0.35 IU/mL (ref 0.30–0.70)
Heparin Unfractionated: 0.48 IU/mL (ref 0.30–0.70)
Heparin Unfractionated: 0.48 IU/mL (ref 0.30–0.70)
Heparin Unfractionated: 0.49 IU/mL (ref 0.30–0.70)

## 2010-12-15 LAB — URINALYSIS, ROUTINE W REFLEX MICROSCOPIC
Glucose, UA: NEGATIVE mg/dL
Hgb urine dipstick: NEGATIVE
Ketones, ur: 15 mg/dL — AB
Nitrite: NEGATIVE
Protein, ur: NEGATIVE mg/dL
Specific Gravity, Urine: 1.015 (ref 1.005–1.030)
Urobilinogen, UA: 2 mg/dL — ABNORMAL HIGH (ref 0.0–1.0)
pH: 6 (ref 5.0–8.0)

## 2010-12-15 LAB — CBC
HCT: 40.7 % (ref 39.0–52.0)
HCT: 40.8 % (ref 39.0–52.0)
HCT: 41 % (ref 39.0–52.0)
HCT: 41.3 % (ref 39.0–52.0)
HCT: 41.5 % (ref 39.0–52.0)
HCT: 41.6 % (ref 39.0–52.0)
HCT: 42.3 % (ref 39.0–52.0)
Hemoglobin: 13.7 g/dL (ref 13.0–17.0)
Hemoglobin: 13.7 g/dL (ref 13.0–17.0)
Hemoglobin: 14 g/dL (ref 13.0–17.0)
Hemoglobin: 14 g/dL (ref 13.0–17.0)
Hemoglobin: 14 g/dL (ref 13.0–17.0)
Hemoglobin: 14 g/dL (ref 13.0–17.0)
Hemoglobin: 14.3 g/dL (ref 13.0–17.0)
MCHC: 33.5 g/dL (ref 30.0–36.0)
MCHC: 33.6 g/dL (ref 30.0–36.0)
MCHC: 33.6 g/dL (ref 30.0–36.0)
MCHC: 33.7 g/dL (ref 30.0–36.0)
MCHC: 33.8 g/dL (ref 30.0–36.0)
MCHC: 33.8 g/dL (ref 30.0–36.0)
MCHC: 34.3 g/dL (ref 30.0–36.0)
MCV: 97.4 fL (ref 78.0–100.0)
MCV: 97.9 fL (ref 78.0–100.0)
MCV: 98.3 fL (ref 78.0–100.0)
MCV: 98.5 fL (ref 78.0–100.0)
MCV: 99 fL (ref 78.0–100.0)
MCV: 99.2 fL (ref 78.0–100.0)
MCV: 99.9 fL (ref 78.0–100.0)
Platelets: 142 10*3/uL — ABNORMAL LOW (ref 150–400)
Platelets: 143 10*3/uL — ABNORMAL LOW (ref 150–400)
Platelets: 144 10*3/uL — ABNORMAL LOW (ref 150–400)
Platelets: 170 10*3/uL (ref 150–400)
Platelets: 174 10*3/uL (ref 150–400)
Platelets: 198 10*3/uL (ref 150–400)
Platelets: 214 10*3/uL (ref 150–400)
RBC: 4.11 MIL/uL — ABNORMAL LOW (ref 4.22–5.81)
RBC: 4.15 MIL/uL — ABNORMAL LOW (ref 4.22–5.81)
RBC: 4.16 MIL/uL — ABNORMAL LOW (ref 4.22–5.81)
RBC: 4.18 MIL/uL — ABNORMAL LOW (ref 4.22–5.81)
RBC: 4.24 MIL/uL (ref 4.22–5.81)
RBC: 4.24 MIL/uL (ref 4.22–5.81)
RBC: 4.25 MIL/uL (ref 4.22–5.81)
RDW: 13.4 % (ref 11.5–15.5)
RDW: 13.5 % (ref 11.5–15.5)
RDW: 13.5 % (ref 11.5–15.5)
RDW: 13.5 % (ref 11.5–15.5)
RDW: 13.9 % (ref 11.5–15.5)
RDW: 13.9 % (ref 11.5–15.5)
RDW: 14 % (ref 11.5–15.5)
WBC: 10.9 10*3/uL — ABNORMAL HIGH (ref 4.0–10.5)
WBC: 12.4 10*3/uL — ABNORMAL HIGH (ref 4.0–10.5)
WBC: 6.8 10*3/uL (ref 4.0–10.5)
WBC: 7.5 10*3/uL (ref 4.0–10.5)
WBC: 7.7 10*3/uL (ref 4.0–10.5)
WBC: 8.1 10*3/uL (ref 4.0–10.5)
WBC: 9.2 10*3/uL (ref 4.0–10.5)

## 2010-12-15 LAB — PROTIME-INR
INR: 1.19 (ref 0.00–1.49)
INR: 1.28 (ref 0.00–1.49)
INR: 1.4 (ref 0.00–1.49)
INR: 1.86 — ABNORMAL HIGH (ref 0.00–1.49)
INR: 3.67 — ABNORMAL HIGH (ref 0.00–1.49)
Prothrombin Time: 15 seconds (ref 11.6–15.2)
Prothrombin Time: 15.9 seconds — ABNORMAL HIGH (ref 11.6–15.2)
Prothrombin Time: 17 seconds — ABNORMAL HIGH (ref 11.6–15.2)
Prothrombin Time: 21.3 seconds — ABNORMAL HIGH (ref 11.6–15.2)
Prothrombin Time: 36.2 seconds — ABNORMAL HIGH (ref 11.6–15.2)

## 2010-12-15 LAB — DIFFERENTIAL
Basophils Absolute: 0 10*3/uL (ref 0.0–0.1)
Basophils Absolute: 0.1 10*3/uL (ref 0.0–0.1)
Basophils Absolute: 0.1 10*3/uL (ref 0.0–0.1)
Basophils Absolute: 0.1 10*3/uL (ref 0.0–0.1)
Basophils Relative: 0 % (ref 0–1)
Basophils Relative: 1 % (ref 0–1)
Basophils Relative: 1 % (ref 0–1)
Basophils Relative: 1 % (ref 0–1)
Eosinophils Absolute: 0 10*3/uL (ref 0.0–0.7)
Eosinophils Absolute: 0.2 10*3/uL (ref 0.0–0.7)
Eosinophils Absolute: 0.3 10*3/uL (ref 0.0–0.7)
Eosinophils Absolute: 0.3 10*3/uL (ref 0.0–0.7)
Eosinophils Relative: 0 % (ref 0–5)
Eosinophils Relative: 3 % (ref 0–5)
Eosinophils Relative: 3 % (ref 0–5)
Eosinophils Relative: 4 % (ref 0–5)
Lymphocytes Relative: 18 % (ref 12–46)
Lymphocytes Relative: 20 % (ref 12–46)
Lymphocytes Relative: 20 % (ref 12–46)
Lymphocytes Relative: 8 % — ABNORMAL LOW (ref 12–46)
Lymphs Abs: 1 10*3/uL (ref 0.7–4.0)
Lymphs Abs: 1.5 10*3/uL (ref 0.7–4.0)
Lymphs Abs: 1.5 10*3/uL (ref 0.7–4.0)
Lymphs Abs: 1.5 10*3/uL (ref 0.7–4.0)
Monocytes Absolute: 0.7 10*3/uL (ref 0.1–1.0)
Monocytes Absolute: 0.8 10*3/uL (ref 0.1–1.0)
Monocytes Absolute: 0.9 10*3/uL (ref 0.1–1.0)
Monocytes Absolute: 1.2 10*3/uL — ABNORMAL HIGH (ref 0.1–1.0)
Monocytes Relative: 10 % (ref 3–12)
Monocytes Relative: 10 % (ref 3–12)
Monocytes Relative: 10 % (ref 3–12)
Monocytes Relative: 11 % (ref 3–12)
Neutro Abs: 10.1 10*3/uL — ABNORMAL HIGH (ref 1.7–7.7)
Neutro Abs: 5 10*3/uL (ref 1.7–7.7)
Neutro Abs: 5.1 10*3/uL (ref 1.7–7.7)
Neutro Abs: 5.5 10*3/uL (ref 1.7–7.7)
Neutrophils Relative %: 66 % (ref 43–77)
Neutrophils Relative %: 66 % (ref 43–77)
Neutrophils Relative %: 68 % (ref 43–77)
Neutrophils Relative %: 81 % — ABNORMAL HIGH (ref 43–77)

## 2010-12-15 LAB — URINE CULTURE: Colony Count: 80000

## 2010-12-15 LAB — BASIC METABOLIC PANEL
BUN: 16 mg/dL (ref 6–23)
BUN: 17 mg/dL (ref 6–23)
BUN: 17 mg/dL (ref 6–23)
BUN: 18 mg/dL (ref 6–23)
BUN: 18 mg/dL (ref 6–23)
BUN: 19 mg/dL (ref 6–23)
BUN: 19 mg/dL (ref 6–23)
BUN: 19 mg/dL (ref 6–23)
CO2: 22 mEq/L (ref 19–32)
CO2: 24 mEq/L (ref 19–32)
CO2: 24 mEq/L (ref 19–32)
CO2: 24 mEq/L (ref 19–32)
CO2: 25 mEq/L (ref 19–32)
CO2: 28 mEq/L (ref 19–32)
CO2: 28 mEq/L (ref 19–32)
CO2: 30 mEq/L (ref 19–32)
Calcium: 8.5 mg/dL (ref 8.4–10.5)
Calcium: 8.6 mg/dL (ref 8.4–10.5)
Calcium: 8.7 mg/dL (ref 8.4–10.5)
Calcium: 8.7 mg/dL (ref 8.4–10.5)
Calcium: 8.8 mg/dL (ref 8.4–10.5)
Calcium: 8.9 mg/dL (ref 8.4–10.5)
Calcium: 9.2 mg/dL (ref 8.4–10.5)
Calcium: 9.3 mg/dL (ref 8.4–10.5)
Chloride: 100 mEq/L (ref 96–112)
Chloride: 104 mEq/L (ref 96–112)
Chloride: 104 mEq/L (ref 96–112)
Chloride: 104 mEq/L (ref 96–112)
Chloride: 105 mEq/L (ref 96–112)
Chloride: 106 mEq/L (ref 96–112)
Chloride: 107 mEq/L (ref 96–112)
Chloride: 107 mEq/L (ref 96–112)
Creatinine, Ser: 0.99 mg/dL (ref 0.4–1.5)
Creatinine, Ser: 1.04 mg/dL (ref 0.4–1.5)
Creatinine, Ser: 1.07 mg/dL (ref 0.4–1.5)
Creatinine, Ser: 1.08 mg/dL (ref 0.4–1.5)
Creatinine, Ser: 1.1 mg/dL (ref 0.4–1.5)
Creatinine, Ser: 1.2 mg/dL (ref 0.4–1.5)
Creatinine, Ser: 1.23 mg/dL (ref 0.4–1.5)
Creatinine, Ser: 1.36 mg/dL (ref 0.4–1.5)
GFR calc Af Amer: >60 mL/min (ref >60)
GFR calc Af Amer: >60 mL/min (ref >60)
GFR calc Af Amer: >60 mL/min (ref >60)
GFR calc Af Amer: >60 mL/min (ref >60)
GFR calc Af Amer: >60 mL/min (ref >60)
GFR calc Af Amer: >60 mL/min (ref >60)
GFR calc Af Amer: >60 mL/min (ref >60)
GFR calc Af Amer: >60 mL/min (ref >60)
GFR calc non Af Amer: 50 mL/min — ABNORMAL LOW (ref >60)
GFR calc non Af Amer: 56 mL/min — ABNORMAL LOW (ref >60)
GFR calc non Af Amer: 58 mL/min — ABNORMAL LOW (ref >60)
GFR calc non Af Amer: >60 mL/min (ref >60)
GFR calc non Af Amer: >60 mL/min (ref >60)
GFR calc non Af Amer: >60 mL/min (ref >60)
GFR calc non Af Amer: >60 mL/min (ref >60)
GFR calc non Af Amer: >60 mL/min (ref >60)
Glucose, Bld: 101 mg/dL — ABNORMAL HIGH (ref 70–99)
Glucose, Bld: 104 mg/dL — ABNORMAL HIGH (ref 70–99)
Glucose, Bld: 105 mg/dL — ABNORMAL HIGH (ref 70–99)
Glucose, Bld: 78 mg/dL (ref 70–99)
Glucose, Bld: 85 mg/dL (ref 70–99)
Glucose, Bld: 87 mg/dL (ref 70–99)
Glucose, Bld: 92 mg/dL (ref 70–99)
Glucose, Bld: 95 mg/dL (ref 70–99)
Potassium: 3.8 mEq/L (ref 3.5–5.1)
Potassium: 3.8 mEq/L (ref 3.5–5.1)
Potassium: 3.9 mEq/L (ref 3.5–5.1)
Potassium: 4 mEq/L (ref 3.5–5.1)
Potassium: 4.1 mEq/L (ref 3.5–5.1)
Potassium: 4.2 mEq/L (ref 3.5–5.1)
Potassium: 4.2 mEq/L (ref 3.5–5.1)
Potassium: 4.5 mEq/L (ref 3.5–5.1)
Sodium: 135 mEq/L (ref 135–145)
Sodium: 136 mEq/L (ref 135–145)
Sodium: 138 mEq/L (ref 135–145)
Sodium: 138 mEq/L (ref 135–145)
Sodium: 138 mEq/L (ref 135–145)
Sodium: 138 mEq/L (ref 135–145)
Sodium: 140 mEq/L (ref 135–145)
Sodium: 140 mEq/L (ref 135–145)

## 2010-12-15 LAB — POCT I-STAT, CHEM 8
BUN: 18 mg/dL (ref 6–23)
Calcium, Ion: 1.17 mmol/L (ref 1.12–1.32)
Chloride: 105 mEq/L (ref 96–112)
Creatinine, Ser: 1 mg/dL (ref 0.4–1.5)
Glucose, Bld: 120 mg/dL — ABNORMAL HIGH (ref 70–99)
HCT: 43 % (ref 39.0–52.0)
Hemoglobin: 14.6 g/dL (ref 13.0–17.0)
Potassium: 4.6 mEq/L (ref 3.5–5.1)
Sodium: 139 mEq/L (ref 135–145)
TCO2: 28 mmol/L (ref 0–100)

## 2010-12-15 LAB — CK TOTAL AND CKMB (NOT AT ARMC)
CK, MB: 1.3 ng/mL (ref 0.3–4.0)
Relative Index: 1.1 (ref 0.0–2.5)
Total CK: 122 U/L (ref 7–232)

## 2010-12-15 LAB — POCT CARDIAC MARKERS
CKMB, poc: <1 ng/mL — ABNORMAL LOW (ref 1.0–8.0)
Myoglobin, poc: 192 ng/mL (ref 12–200)
Troponin i, poc: <0.05 ng/mL (ref 0.00–0.09)

## 2010-12-15 LAB — POCT I-STAT 3, ART BLOOD GAS (G3+)
Acid-base deficit: 1 mmol/L (ref 0.0–2.0)
Bicarbonate: 23 mEq/L (ref 20.0–24.0)
O2 Saturation: 96 %
TCO2: 24 mmol/L (ref 0–100)
pCO2 arterial: 35.9 mmHg (ref 35.0–45.0)
pH, Arterial: 7.416 (ref 7.350–7.450)
pO2, Arterial: 79 mmHg — ABNORMAL LOW (ref 80.0–100.0)

## 2010-12-15 LAB — BRAIN NATRIURETIC PEPTIDE
Pro B Natriuretic peptide (BNP): 512 pg/mL — ABNORMAL HIGH (ref 0.0–100.0)
Pro B Natriuretic peptide (BNP): 672 pg/mL — ABNORMAL HIGH (ref 0.0–100.0)

## 2010-12-15 LAB — CARDIAC PANEL(CRET KIN+CKTOT+MB+TROPI)
CK, MB: 1.4 ng/mL (ref 0.3–4.0)
CK, MB: 1.4 ng/mL (ref 0.3–4.0)
Relative Index: 0.9 (ref 0.0–2.5)
Relative Index: 1 (ref 0.0–2.5)
Total CK: 144 U/L (ref 7–232)
Total CK: 153 U/L (ref 7–232)
Troponin I: 0.07 ng/mL — ABNORMAL HIGH (ref 0.00–0.06)
Troponin I: 0.1 ng/mL — ABNORMAL HIGH (ref 0.00–0.06)

## 2010-12-15 LAB — SAMPLE TO BLOOD BANK

## 2010-12-15 LAB — MRSA PCR SCREENING: MRSA by PCR: NEGATIVE

## 2010-12-15 LAB — APTT: aPTT: 33 seconds (ref 24–37)

## 2010-12-15 LAB — TROPONIN I: Troponin I: 0.1 ng/mL — ABNORMAL HIGH (ref 0.00–0.06)

## 2010-12-20 ENCOUNTER — Other Ambulatory Visit: Payer: Self-pay | Admitting: Dermatology

## 2010-12-21 ENCOUNTER — Ambulatory Visit (INDEPENDENT_AMBULATORY_CARE_PROVIDER_SITE_OTHER): Payer: Medicare Other | Admitting: Cardiovascular Disease

## 2010-12-21 ENCOUNTER — Encounter: Payer: Self-pay | Admitting: Cardiovascular Disease

## 2010-12-21 VITALS — BP 170/84 | HR 60 | Ht 69.0 in | Wt 195.8 lb

## 2010-12-21 DIAGNOSIS — I10 Essential (primary) hypertension: Secondary | ICD-10-CM

## 2010-12-21 DIAGNOSIS — I4891 Unspecified atrial fibrillation: Secondary | ICD-10-CM

## 2010-12-21 DIAGNOSIS — C61 Malignant neoplasm of prostate: Secondary | ICD-10-CM

## 2010-12-21 DIAGNOSIS — E7800 Pure hypercholesterolemia, unspecified: Secondary | ICD-10-CM

## 2010-12-21 DIAGNOSIS — I739 Peripheral vascular disease, unspecified: Secondary | ICD-10-CM

## 2010-12-21 NOTE — Patient Instructions (Signed)
Your physician recommends that you schedule a follow-up appointment in:  6 months with Dr. [name] Your physician has requested that you have a lower or extremity arterial duplex. This test is an ultrasound of the arteries in the legs or arms. It looks at arterial blood flow in the legs and arms. Allow one hour for Lower and Upper Arterial scans. There are no restrictions or special instructions

## 2010-12-21 NOTE — Assessment & Plan Note (Signed)
Lab Results  Component Value Date   LDLCALC  Value: 53        Total Cholesterol/HDL:CHD Risk Coronary Heart Disease Risk Table                     Men   Women  1/2 Average Risk   3.4   3.3  Average Risk       5.0   4.4  2 X Average Risk   9.6   7.1  3 X Average Risk  23.4   11.0        Use the calculated Patient Ratio above and the CHD Risk Table to determine the patient's CHD Risk.        ATP III CLASSIFICATION (LDL):  <100     mg/dL   Optimal  308-657  mg/dL   Near or Above                    Optimal  130-159  mg/dL   Borderline  846-962  mg/dL   High  >952     mg/dL   Very High 8/41/3244   At goal with no side effects

## 2010-12-21 NOTE — Assessment & Plan Note (Signed)
Maint NSR S/P [facility] 4/11  Continue coumadin clinic

## 2010-12-21 NOTE — Progress Notes (Signed)
Ryan Hess is seen today in F/U for afib.  He was initially seen in 4/11 during a pneumonia where he had afib.  Initial TEE showed LAA clot.  F/U TEE about a month ago showed no clot and he was successfully cardioverted.  He has been doing well with baseline dyspnea from his lung disease.  He denies SSCP, palpitations, syncope, edema or TIA's  ECG today confirms maint. of NSR with occasional PVC's.  His INR's have been Rx.  He had a non-ischemic myovue with normal EF on 1/11.  He has a history of prostate CA and sees Dr Annabell Howells.  His PSA has bumped slightly and is being followed closely.  Since his pneumonia he has lost about 15 lbs but is stable now.  He has had some hemoptysis and was just started on Augmentin by Jeanmarie Plant.  He needs his PT rechecked today.  He has a history of aortobifem for aneurysm and has not been see by Dr Hart Rochester in years.  He had some trauma to his right foot that was slow to heal.  Mild claudication.  ROS: Denies fever, malais, weight loss, blurry vision, decreased visual acuity, cough, sputum, SOB, hemoptysis, pleuritic pain, palpitaitons, heartburn, abdominal pain, melena, lower extremity edema, claudication, or rash.   General: Affect appropriate Healthy:  appears stated age HEENT: normal Neck supple with no adenopathy JVP normal no bruits no thyromegaly Lungs clear with no wheezing and good diaphragmatic motion Heart:  S1/S2 no murmur,rub, gallop or click PMI normal Abdomen: benighn, BS positve, no tenderness, no AAA Bilateral femoral  bruit.  No HSM or HJR Distal pulses intact with no bruits No edema Neuro non-focal Skin warm and dry No muscular weakness   Current Outpatient Prescriptions  Medication Sig Dispense Refill  . aspirin 81 MG tablet Take 81 mg by mouth daily.        [name] atenolol (TENORMIN) 50 MG tablet Take 50 mg by mouth daily.        [name] atorvastatin (LIPITOR) 10 MG tablet Take 10 mg by mouth daily.        . Fluticasone-Salmeterol (ADVAIR DISKUS)  250-50 MCG/DOSE AEPB Inhale 1 puff into the lungs every 12 (twelve) hours.        . furosemide (LASIX) 20 MG tablet Take 40 mg by mouth daily.        [name] guaiFENesin (MUCINEX) 600 MG 12 hr tablet Take 1,200 mg by mouth 2 (two) times daily as needed.        [name] losartan (COZAAR) 100 MG tablet Take 100 mg by mouth daily.        . Multiple Vitamin (MULTIVITAMIN) tablet Take 1 tablet by mouth daily.        . potassium chloride (K-DUR,KLOR-CON) 10 MEQ tablet Take 1 tablet by mouth Daily.      . potassium chloride (KLOR-CON) 10 MEQ CR tablet Take 10 mEq by mouth daily.        [name] tiotropium (SPIRIVA) 18 MCG inhalation capsule Place 18 mcg into inhaler and inhale daily.        [name] warfarin (COUMADIN) 2.5 MG tablet Take by mouth as directed.          Allergies  Review of patient's allergies indicates not on file. No allergies per patient today  Assessment and Plan

## 2010-12-21 NOTE — Assessment & Plan Note (Signed)
Well controlled.  Continue current medications and low sodium Dash type diet.    

## 2010-12-21 NOTE — Assessment & Plan Note (Signed)
Just had digital exam with Dr Annabell Howells. PSA stable

## 2010-12-21 NOTE — Assessment & Plan Note (Signed)
Slow healing with bruits and previous AAA surgery.  ABI"s and LE arterial duplex

## 2011-01-03 ENCOUNTER — Encounter: Payer: Medicare Other | Admitting: *Deleted

## 2011-01-04 ENCOUNTER — Encounter: Payer: Medicare Other | Admitting: *Deleted

## 2011-01-05 ENCOUNTER — Other Ambulatory Visit: Payer: Self-pay | Admitting: *Deleted

## 2011-01-05 ENCOUNTER — Ambulatory Visit (INDEPENDENT_AMBULATORY_CARE_PROVIDER_SITE_OTHER): Payer: Medicare Other | Admitting: *Deleted

## 2011-01-05 ENCOUNTER — Encounter (INDEPENDENT_AMBULATORY_CARE_PROVIDER_SITE_OTHER): Payer: Medicare Other | Admitting: *Deleted

## 2011-01-05 DIAGNOSIS — I4891 Unspecified atrial fibrillation: Secondary | ICD-10-CM

## 2011-01-05 DIAGNOSIS — I739 Peripheral vascular disease, unspecified: Secondary | ICD-10-CM

## 2011-01-05 LAB — POCT INR: INR: 2.6

## 2011-01-09 ENCOUNTER — Encounter: Payer: Self-pay | Admitting: Cardiovascular Disease

## 2011-01-25 ENCOUNTER — Telehealth: Payer: Self-pay | Admitting: Cardiovascular Disease

## 2011-01-25 NOTE — Telephone Encounter (Signed)
Pt is having a tooth pulled 5/7 at 8:30 and they need to know if he needs to come off his coumadin or what he needs to do

## 2011-01-25 NOTE — Telephone Encounter (Signed)
Spoke with lisa at dr taylor's office. Pt has a tooth broken off at the gum line and may need surgical removal of that tooth. Question if pt can hold coumadin prior to the procedure and if he needs pre-med. Will forward for dr [name] review Ryan Hess

## 2011-01-27 NOTE — Telephone Encounter (Signed)
Ok to come off coumadin and no antibiotics needed

## 2011-01-30 NOTE — Telephone Encounter (Signed)
Per lisa, pt at office now for a dental appointment. Status of coumadin , and pre med. pls advise [id number].

## 2011-01-30 NOTE — Telephone Encounter (Signed)
Spoke with lisa, she is aware pt okay to hold coumadin Deliah Goody

## 2011-02-02 ENCOUNTER — Ambulatory Visit (INDEPENDENT_AMBULATORY_CARE_PROVIDER_SITE_OTHER): Payer: Medicare Other | Admitting: *Deleted

## 2011-02-02 DIAGNOSIS — I4891 Unspecified atrial fibrillation: Secondary | ICD-10-CM

## 2011-02-02 LAB — POCT INR: INR: 1.7

## 2011-02-07 ENCOUNTER — Other Ambulatory Visit: Payer: Self-pay | Admitting: Cardiovascular Disease

## 2011-02-07 ENCOUNTER — Other Ambulatory Visit: Payer: Self-pay | Admitting: Pulmonary Disease

## 2011-02-07 NOTE — Assessment & Plan Note (Signed)
OFFICE VISIT   Ryan Hess, Ryan Hess  DOB:  09/14/1924                                        July 25, 2007  CHART #:  [id number]   The patient returns today approximately a year and a half after his last  visit in August of 2008.  In 1998 he underwent resection of an  infrarenal abdominal aortic aneurysm and a large right common iliac  aneurysm by Dr. Kellie Simmering.  At that time was noted to have dilatation of  his descending aorta to about 4.9 cm incidentally found in 2005.  Since  that time he has been followed intermittently with CT scans of the  chest.  The last scan done in August of 2007 showed a fusiform  dilatation of the thoracoabdominal aorta in the vicinity of the  diaphragm and he also has known COPD and although symptomatic, has  calcification of his coronary arteries.  Since I last saw him he has  been diagnosed with prostate cancer and is considering his treatment  options.  He has also had cataract surgery.  He has no angina or  evidence of congestive heart failure.  He notes that he walks up to a  mild a day.   ON EXAM:  His blood pressure is 124/73, pulse 53, respiratory rate is  18, O2 saturation is 93%.  LUNGS:  Clear.  NECK:  He has no carotid bruits.  ABDOMEN:  Benign exam.  In the upper abdomen a prominent aortic pulse is  faintly palpable.  He has easily palpable femoral pulses. His abdominal  incision is well-healed.  EXTREMITIES:  He has palpable DP and PT pulses.   Followup CT scan was done that shows the suprarenal portion of his aorta  to be about the same size as it was on the previous scan at about 5 cm.  The distal thoracic aorta tapers back to about 3.6 cm.   At this point with the patient's age of 89 years and an unchanging  thoracoabdominal aneurysm with the operative risk involved and placed in  the site of the aneurysm where stenting would be difficult, I have not  recommended any operative intervention.  The patient is  agreeable with  this.  I do plan to see him back in approximately 18 months.  He is to see Dr.  Jeffie Pollock for consideration of treatment of his prostate cancer.   Lanelle Bal, MD  Electronically Signed   EG/MEDQ  D:  07/25/2007  T:  07/25/2007  Job:  [id number]   cc:   Marshall Cork. Jeffie Pollock, M.D.

## 2011-02-07 NOTE — Assessment & Plan Note (Signed)
[place]                            CARDIOLOGY OFFICE NOTE   NAME:Ryan Hess, Ryan Hess                     MRN:          [id number]  DATE:03/18/2008                            DOB:          [birth year]    An 77 year old patient referred for weakness, bradycardia, and PVCs.  Ryan Hess is a delightful 77 year old patient with history of  aneurysmal disease.  He sees Dr. Servando Snare on a regular basis.  He has  had a triple A repair and is having a descending thoracic aneurysm  followed; it measures about 4.9 cm.  It has been followed since 2005.   He is currently asymptomatic from this.  He says he has never had his  heart worked up.  He is on multiple blood pressure pills that can slow  his heart rate down.  He is on atenolol 50 a day, Norvasc 10 a day, and  verapamil 240 a day.  He and his family have noticed that his pulse has  been somewhat slow and weak.  He said it got down to the 65s recently.   He was noted to have PVCs on his EKG and was referred here.  He is not  having any significant chest pain.  He does get exertional dyspnea which  seems functional and related to age.  However, he may have some  chronotropic incompetence given all of his negatively chronotropically  active medicines.   He has not had any frank syncope.  He has not had any prolonged  palpitations or diaphoresis.   In regards to his dyspnea, it is stable.  It is not progressive.  There  is no associated cough or pleuritic pain.  He has not had lower  extremity edema.   He sees Dr. Servando Snare on a regular basis for his aneurysm followup.   He has not had a stress test or echo in the past that I can see.  His  review of systems is otherwise negative.   The past medical history is primarily remarkable for bilateral knee  replacements, left 15 years ago, in the right 4 years ago, and triple A  repair.   The patient is retired.  His wife's health is not very good.  He has  3  children, two of whom check in on him on a regular basis.  He continues  to do all activities of daily living and drives.  He quit smoking in  1990.  He does not drink excessively.  He tries to walk on a regular  basis.   His family history is remarkable for father dying at age 19 of a stroke,  otherwise negative.  His current medications include:  1. An aspirin a day.  2. Atenolol 50 a day, to be decreased to 25 a day.  3. Norvasc 5 a day.  4. Verapamil 240 a day, to be discontinued.  5. Lipitor 10 a day.  6. Multivitamins.  7. Lisinopril 10 a day to be started.   PHYSICAL EXAMINATION:  Remarkable for an elderly white male who looks  better than his stated age.  His  blood pressure is 130/75, pulse is 55  with occasional PVCs, afebrile, respiratory rate 14, weight 214.  HEENT:  Unremarkable.  NECK:  Carotids are normal without bruit.  No lymphadenopathy,  thyromegaly, or JVP elevation.  LUNGS:  Clear with good diaphragmatic motion.  No wheezing.  HEART:  S1 and S2 with normal heart sounds, PMI normal.  ABDOMEN:  Benign.  Status post triple A.  No tenderness, no bruit, no  hepatosplenomegaly, or hepatojugular reflux.  EXTREMITIES:  Distal pulses are intact, with no edema.  NEURO:  Nonfocal.  SKIN:  Warm and dry.  No muscular weakness.  Status post bilateral knee  replacements.   EKG shows sinus rhythm with left axis deviation and PVCs.   IMPRESSION:  1. Premature ventricular contractions, not sure of the etiology.      Check 2D echocardiogram to assess right ventricular and left      ventricular function.  Also check adenosine Myoview to rule out      coronary artery disease.  2. Relative bradycardia, some symptomatic, with heart rate down into      the 30s.  We will decrease atenolol to 25 a day and stop      verapamil.  3. Hypertension in the setting of thoracic and abdominal aneurysm      history, blood pressure control important.  Since we are cutting      back his  atenolol and stopping his verapamil, I would add      lisinopril 10 mg a day.  We will check his BMET in 6 to 8 weeks.  4. Hyperlipidemia in the setting of vascular disease of the aorta.      Continue Lipitor 10 mg a day.  Lipid and liver profile in 6 months.  5. History of osteoarthritis with bilateral knee pain.  Continue      p.r.n. nonsteroidals, Naprosyn would probably be the best, as well      as aspirin therapy as needed.   I will see him back in 6-8 weeks to follow up his echo and Myoview and  see how his blood pressure and PVCs are responding to his medications  changes.     Wallis Bamberg. Johnsie Cancel, MD, [facility]  Electronically Signed    PCN/MedQ  DD: 03/18/2008  DT: 03/19/2008  Job #: [id number]   cc:   Lanelle Bal, MD  Deborra Medina. Lenna Gilford, MD

## 2011-02-07 NOTE — Assessment & Plan Note (Signed)
OFFICE VISIT   BURLIN, MCNAIR  DOB:  05/15/1924                                        January 14, 2009  CHART #:  [id number]   The patient returns to the office today with a followup CT scan for  measurement of his thoracoabdominal aneurysm.  The patient comes in  today.  Since last seen, he has undergone radiation treatment for  prostate cancer and so far he has had no evidence of recurrence.  I have  followed him since December 2005 for a thoracoabdominal aneurysm  involving the distal thoracic aorta at the hiatus.  He returns today  with a followup CT scan.  Overall, considering his age at 24 years, he  seems to be active without angina or evidence of congestive heart  failure.   PHYSICAL EXAMINATION:  VITAL SIGNS:  His blood pressure is 165/87, pulse  is 59, respiratory rate is 18, and O2 sats 95%.  NECK:  He has no carotid bruits.  ABDOMEN:  Benign.  He has a healed midline abdominal incision and  femoral incisions.  He has a prominent aortic pulse, but cannot feel the  abdominal aorta being enlarged.  LUNGS:  Clear.  EXTREMITIES:  He has palpable DP and PT pulses.   Followup CT scan was done that shows suprarenal portion of the aorta  about the same size between 5.1 and 5.3 cm just above the previous  abdominal aortic aneurysm repair done by Dr. Hart Rochester approximately 10  years ago.  Overall, the patient has stable size of his aorta with  dilatation and suprarenal and distal thoracic aorta segment with his age  of 57 years and no significant change on his CT scan over 18 months.  I  would recommend to continue followup  with good blood pressure control and we will rescan him in another 18  months.  This has been discussed and explained to the patient in detail  and he is agreeable with this approach.   Sheliah Plane, MD  Electronically Signed   EG/MEDQ  D:  01/14/2009  T:  01/15/2009  Job:  [id number]   cc:   Lonzo Cloud. Kriste Basque, MD  Quita Skye  Hart Rochester, M.D.

## 2011-02-07 NOTE — Assessment & Plan Note (Signed)
[facility]                            CARDIOLOGY OFFICE NOTE   NAME:Ryan Hess, Ryan Hess                     MRN:          [id number]  DATE:05/07/2008                            DOB:          [birth year]    Mr. Ryan Hess returns today for followup.  He has a history of normal  aortic aneurysm repair by Dr. Tyrone Sage.  He has a residual descending  thoracic aneurysm, being followed by Dr. Tyrone Sage.  He was initially  referred to me for weakness, bradycardia, and PVCs.  We adjusted his  medications and stopped his atenolol and verapamil.  We gave him  lisinopril for his blood pressure, however, he developed a cough on  this.  I told him that we will try to switch him to an ARB with a lack  of bradykinin pathway.  Hopefully, his cough will go away, as I think  the ARB is an excellent blood pressure choice in someone who has had a  previous AAA and has a residual aneurysm.   Otherwise, he has been doing well.  He has not had any significant  weakness or bradycardia, now.  His pulse seems better off the beta-  blocker and verapamil.  He is having no chest pain, PND, or orthopnea.  In regards to his PVCs, we did a stress Myoview study which was  nonischemic.  It was not gated due to multiple PVCs.   There was minor thinning of the inferior wall at the base.   His echocardiogram showed an EF of 45-50%   There were no discrete regional wall motion abnormalities.  The EF was a  little bit difficult to calculate due to his PVCs.   I had a long discussion with Ryan Hess.  I did not think there was an  indication for heart cath.  His Myoview appeared benign.  He may have  bradycardia-mediated PVCs, which may improve off his verapamil and beta-  blocker.  He certainly appears to have some sick sinus syndrome, but  there is no indication for a pacer.  We will currently try to adjust his  blood pressure pills and get rid of his cough by switching him into an  ARB.  I  will see him back in 3 months.   REVIEW OF SYSTEMS:  Otherwise negative.  He is active.  He is walking at  least a mile every day and not having any significant weakness,  shortness of breath, or chest pain.   CURRENT MEDICATION:  1. Aspirin a day.  2. Atenolol, which has been decreased to 25 a day.  3. Norvasc 5 a day.  4. Lipitor 10 a day.  5. Multivitamins.  6. Lisinopril 20 a day, which he is going to be switched.  7. Atacand 8 mg a day.   PHYSICAL EXAMINATION:  VITAL SIGNS:  Exam is remarkable for a pulse 16  and regular; blood pressure 140/80; and respiratory rate 14, afebrile.  HEENT:  Unremarkable.  NECK:  Carotids are normal without bruit.  No lymphadenopathy,  thyromegaly, or JVP elevation.  LUNGS:  Clear, good diaphragmatic motion.  No  wheezing.  HEART:  S1 and S2.  Normal heart sounds.  PMI normal.  ABDOMEN:  Benign.  Status post AAA surgery.  He has a residual palpable  aorta, which is nontender.  No hepatosplenomegaly or hepatojugular  reflux.  No tenderness.  No bruit.  EXTREMITIES:  Distal pulses are intact.  No edema.  NEURO:  Nonfocal.  SKIN:  Warm and dry.  MUSCULOSKELETAL:  No muscular weakness.   IMPRESSION:  1. Premature ventricular contractions appear to be benign.  No active      ischemia on Myoview.  Relatively good left ventricular function.      Continue to monitor.  2. Hypertension.  Switch lisinopril to ARB.  See if his cough goes      away.  Follow up in 3 months.  3. Hypercholesterolemia in the setting of vascular disease with      history of abdominal aortic aneurysm.  Continue Lipitor.  He will      get his lipid and liver profile today.  Overall, I think Ryan Hess is      doing well and we will try to adjust his blood pressure pills      staying away from anything that could cause excessive bradycardia.     Noralyn Pick. [name], MD, [facility]  Electronically Signed    PCN/MedQ  DD: 05/07/2008  DT: 05/07/2008  Job #: [id number]

## 2011-02-07 NOTE — Assessment & Plan Note (Signed)
[facility] OFFICE NOTE   NAME:Kakar, HOLLY MAUCH                     MRN:          [id number]  DATE:07/29/2008                            DOB:          07-23-1924    Emswiler returns today for followup.  He is doing fairly well.  He has a  history of aortic aneurysm repair by Dr. Servando Snare.  He has residual  descending thoracic aneurysm that he is following.  He has hypertension.  The last time I saw him had a bit of a cough and we stopped his  lisinopril and switched him to Atacand.  His cough seems to be improved.  He is not having any chest pain, PND, or orthopnea.  He is active.  He  walks on a regular basis.  He tends towards bradycardia and has had  occasional PVCs.  He had a Myoview study done March 24, 2008, which was  nonischemic.  EF was not calculated due to PVCs.  His EF by echo on March 24, 2008, was 45-50%.  We had him on low-dose atenolol and this seems to  have settled him down.  His review of systems is negative.  He needs a  flu shot.  He is on an aspirin a day, atenolol 50 a day, Norvasc 5 a  day, Atacand 8 a day, Lipitor 10 a day.   PHYSICAL EXAMINATION:  GENERAL:  Remarkable for an elderly white male in  no distress.  VITAL SIGNS:  Blood pressure is 130/80, pulse 64 and regular,  respiratory rate 14, afebrile.  HEENT:  Unremarkable.  Carotids normal without bruit.  No  lymphadenopathy, thyromegaly, JVP elevation.  LUNGS:  Clear.  Good diaphragmatic motion.  No wheezing.  S1 and S2,  normal heart sounds, PMI normal.  ABDOMEN:  Benign.  Bowel sounds positive.  No AAA, no tenderness, no  bruit, no hepatosplenomegaly, no hepatojugular reflux tenderness.  EXTREMITIES:  Distal pulses intact.  No edema.  NEURO:  Nonfocal.  SKIN:  Warn and dry.  MUSCULOSKELETAL:  No muscle weakness present status post thoracic  aneurysm repair, follow up with Dr. Servando Snare.   1. Continued to have blood pressure  control.  2. Hypertension, seems to be responding to Atacand, continue this with      low-sodium diet.  3. History of PVCs, fairly good LV function, nonischemic Myoview.      Continue low-dose beta-blocker.  4. Hyperlipidemia in the setting of vascular disease.  Continue      Lipitor.  Lipid and liver profile in 6 months.  Overall, I think      Ilai is doing well.  We will get him his flu shot today and see      him back in 6 months time.     Wallis Bamberg. Johnsie Cancel, MD, [facility]  Electronically Signed    PCN/MedQ  DD: 07/29/2008  DT: 07/29/2008  Job #: [id number]

## 2011-02-07 NOTE — Assessment & Plan Note (Signed)
OFFICE VISIT   Ryan Hess, Ryan Hess  DOB:  [birth year]                                        July 14, 2010  CHART #:  [id number]   HISTORY:  The patient returns to the office today with a followup scan  evaluating the size of his thoracoabdominal aneurysm.  The patient have  been followed by me since 2005, for thoracoabdominal aneurysm in his  descending thoracic aorta extending into the diaphragm down to the  previous abdominal aortic aneurysm was noted in 2005, to be  approximately 4.9 cm.  In February 2011, he was admitted with paroxysmal  atrial fibrillation, left atrial clot, nonobstructive coronary disease  following document by catheterization November 01, 2009, and evidence of  left ventricular dysfunction with acute congestive systolic and  diastolic heart failure with ejection fraction of 40%.  In June of this  year, a CT scan was done by Dr. Kriste Basque that showed a question of cavitary  lesion and lung mass in the right lobe.  A followup scan now shows this  area to be decreasing likely an inflammatory mass and not malignancy.  The patient overall for his age, now 34 years, says he feels good,  though he does note some increasing trouble swallowing, sometimes  coughing when eating cereal.  His heart failure symptoms appear to be  stable and compensated.  He denies pedal edema.  He has known underlying  severe emphysematous changes in his lungs.   PHYSICAL EXAMINATION:  Today, his blood pressure is 142/90, pulse 64,  respiratory rate is 18, and O2 sats 94%.  His breath sounds are distant.  He has no active wheezing.  Abdominal exam is benign without palpable  masses or tenderness.  Cardiac exam reveals a regular rate and rhythm  without murmur or gallop.  His heart rate is 64.  Lower extremities  without edema.   DIAGNOSTIC TESTS:  The patient's CT scan is reviewed.  The descending  thoracic aorta appears similar, actually by measurements now may  have  decreased some is 5.4 x 5.6 at the height as previous the 5.8 x 5.8.  There is improvement in the cavitary right lower lobe process favoring  inflammatory mass versus malignancy.  He has severe centrilobular  emphysema and dilated esophagus.   IMPRESSION:  With the patient's age, now 34 years with both evidence of  heart failure and significant centrilobular emphysema, I would not  recommend any open resection of his descending aorta and  thoracoabdominal aneurysm.  He notes that he is to see Dr. Kriste Basque  tomorrow for followup of the right lower lobe lung lesion.  He will  mention to him also about some difficulty in swallowing likely an  esophageal dysmotility disorder.  I plan to see him back in 1 year or  sooner at Dr. Jodelle Green request.   Sheliah Plane, MD  Electronically Signed   EG/MEDQ  D:  07/14/2010  T:  07/15/2010  Job:  [id number]   cc:   Quita Skye. Hart Rochester, MD  Lonzo Cloud. Kriste Basque, MD

## 2011-02-10 NOTE — Op Note (Signed)
NAME:  Ryan Hess, MESA                        ACCOUNT NO.:  [id number]   MEDICAL RECORD NO.:  [id number]                   PATIENT TYPE:  INP   LOCATION:  X003                                 FACILITY:  [facility]   PHYSICIAN:  Madlyn Frankel. Charlann Boxer, M.D.               DATE OF BIRTH:  May 20, 1924   DATE OF PROCEDURE:  10/13/2003  DATE OF DISCHARGE:                                 OPERATIVE REPORT   PREOPERATIVE DIAGNOSIS:  End-stage right knee osteoarthritis.   POSTOPERATIVE DIAGNOSIS:  End-stage right knee osteoarthritis.   PROCEDURE:  Right total knee replacement.   COMPONENTS USED:  Depuy rotating platform system with a size 6 femur, size 5  tibia with a 6 x 12 1/2 mm polyethylene liner and a 38 mm patella.   SURGEON:  Madlyn Frankel. Charlann Boxer, M.D.   ASSISTANT:  Clarene Reamer, P.A.-C   ANESTHESIA:  Spinal plus MAC.   ESTIMATED BLOOD LOSS:  100 mL   TOURNIQUET TIME:  75 minutes at 300 mmHg.   FLUIDS:  1500 mL lactated Ringer's.   DRAINS:  Drains x1 Hemovac.   COMPLICATIONS:  None apparent.   DISPOSITION:  Extubated stable to the recovery room.   INDICATIONS FOR PROCEDURE:  Mr. Shreve is a pleasant 77 year old white male  who is now 10 years status post left total knee replacement.  He has been  followed considerably over the past years for right knee osteoarthritis.  He  has received several injections and has failed otherwise conservative  management.   Preoperative examination of the right knee under anesthesia revealed a  flexion contracture of 10-15 degrees with significant varus deformity.  After reviewing the risks and benefits of the procedure, he had consented  for right total knee replacement.   DESCRIPTION OF PROCEDURE:  The patient was brought to the operative theatre.  Once adequate anesthesia and preoperative antibiotics were administered, 1 g  of Ancef, the patient was positioned supine, over the proximal thigh a right  tourniquet. The right lower extremity was then  prepped and draped in sterile  fashion. A midline incision was made followed by medial parapatellar  arthrotomy.  Following knee exposure, attention was directed to the femur.  Osteophytes were debrided off of the notch.  An intramedullary device was  placed with 5 degrees of valgus, 11 mm was then cut off of the distal femur  at 5 degrees valgus. A sizing jig was then placed determining that a size 6  femur was best for this femur.  A sizing external rotation was matched to  epicondylar axis as well as the __________.  A size 6 cutting block was  placed, anterior chamfer and posterior cut was made.  A box cutting guide  was then placed on the lateral aspect of the medial femoral condyle. A box  cut was then made.  Final chamfer cuts were recut off the anterior and  posterior chamfers. Trial femur placed  and noted to fit without difficulty.  At this point, attention well-developed directed to the tibia. The tibia was  subluxated anteriorly to allow for full exposure.  The patient was noted to  have significant varus deformity with significant sclerosis on the medial  side of the tibial plateau. An initial cut was made with 10 mm taken off the  lateral side. This was performed in the perpendicular plane in the AP as  well as neutral position __________.  At this point, initial trial reduction  was carried out and the patient was noted to be very stiff, lacking about 20  degrees of full extension and tightened flexion as well with the components  spreading out. Based on this, the revision components were placed and  initial 7 mm bone was cut off the proximal tibia.  This actually allowed [place]  to get below just at the level of the significant sclerotic bone on the  medial side of the tibial plateau.  Following this recut, the trial  reduction was carried out and the patient actually came to full extension  and was very stable with a 12 1/2 mm poly.  With the trial components in  place,  attention was directed at the patella, precut measurement was 24 mm,  9 mm of bone was resected leaving about 14-15 mm of patella. The 38 patella  measured perfectly.  These holes were then drilled. Trial reduction was  carried out. The patient was noted to have little lateral tilt with flexion.  The fibrous tissue off of the lateral retinaculum was released externally  and then the patella tracked without thumbs.  At this point, the trial  components were removed, the tibial rotation was marked with the fixed  barium prosthesis and attention was directed at final preparation of the  tibia. The final rotating platform tibial side was then prepared with the  rotation matched with the fixed barium prosthesis.  The size 5 tibial tray  was rotated appropriately and slid a little laterally to allow for excision  of some of the medial bone to allow for release of the medial collateral  ligaments.  Following this, tibial preparation was carried out, trial  reduction was then carried out and the patient was noted to have very little  rotation of the tibial tray.  The trial components were then removed and  attention was directed to placement of the final components. The knee was  copiously irrigated with pulse lavage solution.  The cement was prepared.  Once the cement was ready, the tibial component was cemented in first. The  femoral component was then cemented in next and a trial liner placed and the  knee brought to full extension. With the knee in full extension, the patella  was cemented to fully cure the knee.  It was copiously irrigated with normal  saline solution. The trial liner was removed, excessive cement was removed  using osteotomes as needed and the final 12 1/2 x 6 rotating platform liner  placed.  Following this, the tourniquet was let down, hemostasis obtained.  The knee was again irrigated and a medium Hemovac drain placed.  The extension mechanism was reapproximated using #1  PDS.  The remainder of the  wound was closed in layers with deep subcutaneous 2-0 followed by 2-0 layer  and running 4-0 Monocryl. The knee was cleaned, dried and dressed sterilely  with Steri-Strips, dressing and bulky dry dressing.  Postoperatively the  patient was noted to come to full extension  and obtained at least 110  degrees of flexion. The patient was brought to the recovery room in stable  condition.                                               Madlyn Frankel Charlann Boxer, M.D.    MDO/MEDQ  D:  10/13/2003  T:  10/13/2003  Job:  [id number]

## 2011-02-10 NOTE — H&P (Signed)
NAME:  Ryan Hess, Ryan Hess                        ACCOUNT NO.:  [id number]   MEDICAL RECORD NO.:  [id number]                   PATIENT TYPE:  INP   LOCATION:  0456                                 FACILITY:  [facility]   PHYSICIAN:  Madlyn Frankel. Charlann Boxer, M.D.               DATE OF BIRTH:  1924-01-16   DATE OF ADMISSION:  10/13/2003  DATE OF DISCHARGE:                                HISTORY & PHYSICAL   CHIEF COMPLAINT:  Right knee pain.   HISTORY OF PRESENT ILLNESS:  The patient is a 77 year old male who saw Dr.  Charlann Boxer for evaluation of his right knee.  He was initially seen on July 29, 2003 when he was clinically and radiographically diagnosed with right knee  end stage osteoarthritis.  At that time he received an injection of  corticosteroid.  On his follow-up visit he states the injection did not  help.  He also goes on to state that in addition to this he had a twisting  injury that increased the severity of his right knee pain.  At this time, he  is ready to proceed with a total knee arthroplasty and Dr. Charlann Boxer agrees.  The  risks and benefits of the surgery have been discussed with the patient and  the patient wishes to proceed.   PAST MEDICAL HISTORY:  1. Hypertension.  2. Hypercholesterolemia.   PAST SURGICAL HISTORY:  1. Left total knee arthroplasty in 1994.  2. Abdominal aortic aneurysm repair.  3. Appendectomy.   MEDICATIONS:  1. Lipitor 10 mg one p.o. q.h.s.  2. Norvasc 5 mg one p.o. q.a.m.  3. Atenolol 50 mg one p.o. q.a.m.  4. Aspirin 81 mg one p.o. q.a.m. which he was instructed to stop taking.  5. Tylenol p.r.n.   ALLERGIES:  No known drug allergies.   SOCIAL HISTORY:  The patient denies any tobacco or alcohol use.  He is  married and he lives in a one story house.   FAMILY HISTORY:  Father with cerebrovascular accident.  Mother had diabetes  mellitus.   REVIEW OF SYMPTOMS:  GENERAL:  He denies fevers, chills, night sweats or  bleeding tendencies.  CNS:  He denies  blurry or double vision, seizures,  headaches or paralysis.  RESPIRATORY:  He denies shortness of breath,  productive cough or hemoptysis.  CARDIOVASCULAR:  He denies chest pain,  angina or orthopnea.  GI:  He denies nausea, vomiting, diarrhea,  constipation, melena or bloody stool.  GU:  He denies dysuria, hematuria or  discharge.  MUSCULOSKELETAL:  Please refer to history of present illness.   PHYSICAL EXAMINATION:  VITAL SIGNS:  Temperature 98.3, pulse 76,  respirations 20, blood pressure 160/80.  GENERAL:  Well developed, well nourished 77 year old male.  HEENT:  Normocephalic, atraumatic.  Pupils equal, round and reactive to  light.  The neck is supple.  There is no carotid bruit noted.  CHEST:  Clear to auscultation bilaterally  with no wheezes or crackles.  HEART:  Regular rate and rhythm with no murmurs, rubs or gallops.  ABDOMEN:  Soft, non-tender and non-distended with positive bowel sounds  times four.  EXTREMITIES:  The patient has a flexion contracture of 5 to 10 degrees.  He  is able to flex to 120 degrees with pain at full flexion.  He has pseudo-  laxity of the medial collateral ligament.  SKIN:  No rashes or lesions.   LABORATORY DATA:  X-rays reveal end stage osteoarthritis of the right knee  and the medial compartment with bone on bone.   IMPRESSION:  1. Osteoarthritis of the right knee.  2. Hypertension.  3. Hypercholesterolemia.   PLAN:  The patient will be admitted to [facility] on October 13, 2003 and undergo a right total knee arthroplasty by Dr. Durene Romans.     Clarene Reamer, P.A.-C.                   Madlyn Frankel Charlann Boxer, M.D.    SW/MEDQ  D:  10/13/2003  T:  10/13/2003  Job:  [id number]   cc:   Lonzo Cloud. Kriste Basque, M.D. [facility]

## 2011-02-10 NOTE — Discharge Summary (Signed)
NAME:  Ryan Hess, Ryan Hess                        ACCOUNT NO.:  [id number]   MEDICAL RECORD NO.:  [id number]                   PATIENT TYPE:  INP   LOCATION:  0451                                 [facility]ACILITY:  [facility]   PHYSICIAN:  Madlyn Frankel. Charlann Boxer, M.D.               DATE OF BIRTH:  Jul 16, 1924   DATE OF ADMISSION:  10/13/2003  DATE OF DISCHARGE:  10/16/2003                                 DISCHARGE SUMMARY   ADMISSION DIAGNOSES:  1. Osteoarthritis of the right knee.  2. Hypertension.  3. Hypercholesterolemia.   DISCHARGE DIAGNOSES:  1. Osteoarthritis of the right knee, status post right total knee     arthroplasty.  2. Hypertension.  3. Hypercholesterolemia.  4. Postoperative hemorrhagic anemia, stable at the time of discharge.   PROCEDURES:  The patient was taken to the operating room on October 13, 2003, and underwent a right total knee arthroplasty.  The surgeon was Dr.  Durene Romans.  Assistant was [employer], P.A.-C.  The surgery was done  under spinal anesthesia, and a Hemovac drain x1 was placed at the time of  surgery.   CONSULTS:  1. Physical therapy.  2. Occupational therapy.  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   BRIEF HISTORY:  The patient is a 77 year old male who had seen Dr. Charlann Boxer for  evaluation of his right knee.  He was initially seen on July 29, 2003  when he was clinically and radiographically diagnosed with right end-stage  osteoarthritis of the knee.  At that time, he received an injection of  corticosteroid.  On his follow up visit, he stated the injection did not  help.  He went on to say that in addition to this he had a twisting injury  that increased the severity of the right knee pain.  At this time, he is  ready to proceed with a total knee, and Dr. Charlann Boxer agrees.  Risks and benefits  of the surgery were discussed with the patient, and the patient opted to  proceed.   LABORATORY DATA:  CBC on admission showed a hemoglobin of 15.7, hematocrit  45.2,  white blood cell count 7.5, red blood cell count 4.8.  Two serial  hemoglobins and hematocrit were followed throughout the hospital stay.  Hemoglobin and hematocrit did decline to 12.3 and 36.6 on October 16, 2003,  but was stable at the time of discharge.  Differential on admission was all  within normal limits.  Coagulation studies on admission were all within  normal limits.  PT and INR at the time of discharge were 22.2 and 2.6  respectively on Coumadin therapy.  Routine chemistry on admission was all  within normal limits.  The glucose did range from a low of 93 on admission  to a high of 156 on October 14, 2003.  Sodium also ranged from a low of 131  on October 16, 2003, to a high of 144 on October 07, 2003, but was also  stable at the time of discharge.  Urinalysis on admission was all within  normal limits.  The patient's blood type is O positive with antibody screen  negative.  EKG showed sinus bradycardia with otherwise normal ECG.  Preoperative chest x-ray revealed tortuous aorta with probable aneurysmal  dilatation of the aortic arch and descending thoracic aorta with no acute  cardiopulmonary disease.  Postoperative x-rays of the right knee revealed  good position and alignment following a right total knee replacement.   HOSPITAL COURSE:  The patient was admitted to [facility] and taken  to the operating room.  He underwent the above-stated procedure without  complication.  The patient tolerated the procedure well, and was allowed to  return to the recovery room and the orthopedic floor to continue  postoperative care.   On postoperative day #1, the patient was doing okay.  Hematocrit was 40.  The dressing was clean, dry, and intact.  He was neurovascularly intact for  the right lower extremity.  The patient was to continue working physical  therapy, occupational therapy, and plans for being discharged home.  Hemovac  was discontinued on this day.   On  postoperative day #2, October 15, 2003, the patient was resting  comfortably with no complaints.  Hemoglobin and hematocrit were 12.5 and  36.8.  He was afebrile, neurovascularly intact to the right lower extremity.  Incision was clean, dry, and intact.  The dressing was changed on this day.  The patient continued working with physical therapy and occupational  therapy.  PCA was also discontinued on this day, and IV was heplocked on  this day.   On postoperative day #3, October 16, 2003, the patient was doing well and  was ready for discharge.  Hemoglobin and hematocrit were 12.3 and 36.6.  He  was neurovascularly intact to the right lower extremity.  He was afebrile.  The incision was clean, dry, and intact.  The patient was to be discharged  home on this date.   DISCHARGE MEDICATIONS:  1. Vicodin, #50, 1-2 p.o. q.4-6h. p.r.n. pain.  2. Robaxin 500 mg, #50, one p.o. q.6-8h. p.r.n. spasm.  3. Coumadin per pharmacy protocol.   DIET:  As tolerated.   ACTIVITY:  The patient is weightbearing as tolerated.  Total knee  precautions to the right knee.  [facility] for home care.   WOUND CARE:  The patient is to perform daily dressing changed until no  drainage.  He may shower when no drainage, or postoperative day #5,  whichever is later.   FOLLOW UP:  He is to follow up with Dr. Charlann Boxer 2 weeks from the day of  surgery.  He is to call the office for an appointment at [phone].   CONDITION ON DISCHARGE:  Stable.     Clarene Reamer, P.A.-C.                   Madlyn Frankel Charlann Boxer, M.D.    SW/MEDQ  D:  11/13/2003  T:  11/14/2003  Job:  [id number]

## 2011-02-13 ENCOUNTER — Ambulatory Visit (INDEPENDENT_AMBULATORY_CARE_PROVIDER_SITE_OTHER): Payer: Medicare Other | Admitting: *Deleted

## 2011-02-13 DIAGNOSIS — I4891 Unspecified atrial fibrillation: Secondary | ICD-10-CM

## 2011-02-13 LAB — POCT INR: INR: 2.9

## 2011-02-14 ENCOUNTER — Other Ambulatory Visit: Payer: Self-pay | Admitting: Pulmonary Disease

## 2011-02-27 ENCOUNTER — Other Ambulatory Visit: Payer: Self-pay | Admitting: Pulmonary Disease

## 2011-02-27 ENCOUNTER — Other Ambulatory Visit: Payer: Self-pay | Admitting: Cardiovascular Disease

## 2011-03-13 ENCOUNTER — Ambulatory Visit (INDEPENDENT_AMBULATORY_CARE_PROVIDER_SITE_OTHER): Payer: Medicare Other | Admitting: *Deleted

## 2011-03-13 DIAGNOSIS — I4891 Unspecified atrial fibrillation: Secondary | ICD-10-CM

## 2011-03-13 LAB — POCT INR: INR: 3.3

## 2011-03-22 ENCOUNTER — Other Ambulatory Visit: Payer: Self-pay | Admitting: Pulmonary Disease

## 2011-03-30 ENCOUNTER — Other Ambulatory Visit: Payer: Self-pay | Admitting: Cardiovascular Disease

## 2011-04-04 ENCOUNTER — Encounter: Payer: Self-pay | Admitting: Internal Medicine

## 2011-04-10 ENCOUNTER — Ambulatory Visit (INDEPENDENT_AMBULATORY_CARE_PROVIDER_SITE_OTHER): Payer: Medicare Other | Admitting: *Deleted

## 2011-04-10 DIAGNOSIS — I4891 Unspecified atrial fibrillation: Secondary | ICD-10-CM

## 2011-04-10 LAB — POCT INR: INR: 2.9

## 2011-05-01 ENCOUNTER — Encounter: Payer: Self-pay | Admitting: Cardiovascular Disease

## 2011-05-08 ENCOUNTER — Ambulatory Visit (INDEPENDENT_AMBULATORY_CARE_PROVIDER_SITE_OTHER): Payer: Medicare Other | Admitting: *Deleted

## 2011-05-08 DIAGNOSIS — I4891 Unspecified atrial fibrillation: Secondary | ICD-10-CM

## 2011-05-08 LAB — POCT INR: INR: 3.1

## 2011-05-15 ENCOUNTER — Ambulatory Visit (INDEPENDENT_AMBULATORY_CARE_PROVIDER_SITE_OTHER): Payer: Medicare Other | Admitting: Internal Medicine

## 2011-05-15 ENCOUNTER — Encounter: Payer: Self-pay | Admitting: Internal Medicine

## 2011-05-15 VITALS — BP 100/68 | HR 76 | Ht 69.0 in | Wt 193.2 lb

## 2011-05-15 DIAGNOSIS — D689 Coagulation defect, unspecified: Secondary | ICD-10-CM

## 2011-05-15 DIAGNOSIS — Z86010 Personal history of colonic polyps: Secondary | ICD-10-CM

## 2011-05-15 NOTE — Patient Instructions (Signed)
Dr [name], Dr Kriste Basque

## 2011-05-15 NOTE — Progress Notes (Signed)
Ryan Hess 04-15-1924 MRN [id number]    History of Present Illness:  This is an 77 year old, white male who is due for a recall colonoscopy. He has a history of adenomatous polyps in 2003. There were  no polyps on his most recent colonoscopy in September 2006 which showed diverticulosis and internal hemorrhoids. He has had radiation to the prostate in 2009. He denies visible blood per rectum, diarrhea, incontinence or abdominal pain. He has been on Coumadin for atrial fibrillation. He has a history of an infrarenal aortic aneurysm which was resected in 1998. He has a 5.6 cm descending aortic aneurysm which is being followed. He has an esophageal dysmotility as per barium esophagram in 2011 which showed poor primary esophageal contractions but no stricture. The esophagus was mildly distended. Patient denies any significant problems with swallowing.   Past Medical History  Diagnosis Date  . COPD (chronic obstructive pulmonary disease)   . Pneumonia, organism unspecified   . Pulmonary nodule   . HTN (hypertension)   . CHF (congestive heart failure)   . Atrial fibrillation   . Drug therapy   . PVC (premature ventricular contraction)   . PVD (peripheral vascular disease)   . Thoracic aortic aneurysm   . Hypercholesteremia   . Diverticulosis of colon   . Colon polyps   . Prostate cancer   . DJD (degenerative joint disease)   . Actinic skin damage   . Hyperlipidemia   . Internal hemorrhoids    Past Surgical History  Procedure Date  . Total knee arthroplasty     bilateral  . Abdominal aortic aneurysm repair 1998  . Appendectomy   . Inguinal hernia repair     left    reports that he quit smoking about 22 years ago. His smoking use included Cigarettes. He has a 88 pack-year smoking history. He has never used smokeless tobacco. He reports that he does not drink alcohol or use illicit drugs. family history includes Alzheimer's disease in his sister; Cancer in his sister; Dementia in  his mother; Diabetes in his brother; Heart disease in his brothers and sister; Obesity in his brother; and Stroke in his father.  There is no history of Colon cancer. No Known Allergies      Review of Systems: Occasional dysphagia. No odynophagia. Weight has been stable. Denies abdominal pain. Denies rectal bleeding  The remainder of the 10  point ROS is negative except as outlined in H&P   Physical Exam: General appearance  Well developed, in no distress. Eyes- non icteric. HEENT nontraumatic, normocephalic. Mouth no lesions, tongue papillated, no cheilosis. Neck supple without adenopathy, thyroid not enlarged, no carotid bruits, no JVD. Lungs Clear to auscultation bilaterally. Cor normal S1 normal S2,ir regular rhythm , no murmur,  quiet precordium. Abdomen soft nontender with post aneurysm scar. No murmur. No distention. Rectal: Soft Hemoccult negative stool. Extremities no pedal edema. Skin no lesions. Neurological alert and oriented x 3. Psychological normal mood and affect.  Assessment and Plan:  Problem #41 77 year old asymptomatic white male with major cardiovascular problems. He is on Coumadin. He had one polyp in 2003 and no polyps 6 years ago. I would not recommend a screening colonoscopy because of possible risks associated with stopping his Coumadin and bridging him with Lovenox. He remains in atrial fibrillation by my exam today. We will not be planning a recall colonoscopy. He agreed with our plan.   05/15/2011 Ryan Hess

## 2011-05-30 ENCOUNTER — Ambulatory Visit (INDEPENDENT_AMBULATORY_CARE_PROVIDER_SITE_OTHER): Payer: Medicare Other | Admitting: Cardiovascular Disease

## 2011-05-30 ENCOUNTER — Encounter: Payer: Self-pay | Admitting: Cardiovascular Disease

## 2011-05-30 ENCOUNTER — Ambulatory Visit (INDEPENDENT_AMBULATORY_CARE_PROVIDER_SITE_OTHER): Payer: Medicare Other | Admitting: *Deleted

## 2011-05-30 DIAGNOSIS — I712 Thoracic aortic aneurysm, without rupture, unspecified: Secondary | ICD-10-CM

## 2011-05-30 DIAGNOSIS — I10 Essential (primary) hypertension: Secondary | ICD-10-CM

## 2011-05-30 DIAGNOSIS — I509 Heart failure, unspecified: Secondary | ICD-10-CM

## 2011-05-30 DIAGNOSIS — E7800 Pure hypercholesterolemia, unspecified: Secondary | ICD-10-CM

## 2011-05-30 DIAGNOSIS — I4891 Unspecified atrial fibrillation: Secondary | ICD-10-CM

## 2011-05-30 LAB — POCT INR: INR: 3.1

## 2011-05-30 NOTE — Assessment & Plan Note (Signed)
Well controlled.  Continue current medications and low sodium Dash type diet.    

## 2011-05-30 NOTE — Assessment & Plan Note (Signed)
Euvolemic EF 40-45% continue current dose of diuretic and ARB

## 2011-05-30 NOTE — Assessment & Plan Note (Signed)
No chest or abdominal pain.  F/U Dr Tyrone Sage  Consider CT/Abdominal US in 6 months

## 2011-05-30 NOTE — Progress Notes (Signed)
Ryan Hess returns today for followup. He is doing fairly well. He has a  history of aortic aneurysm repair by Dr. Servando Snare. He has residual  descending thoracic aneurysm that he is following. He has hypertension.  The last time I saw him had a bit of a cough and we stopped his  lisinopril and switched him to Atacand. His cough seems to be improved.  He is not having any chest pain, PND, or orthopnea. He is active. He  walks on a regular basis. He tends towards bradycardia and has had  occasional PVCs.  Chronic afib on coumadin with no bleeding problems He had a Myoview study done March 24, 2008, which was  nonischemic. EF was not calculated due to PVCs. His EF by echo on March 24, 2008, was 45-50%. We had him on low-dose atenolol and this seems to  have settled him down.   ROS: Denies fever, malais, weight loss, blurry vision, decreased visual acuity, cough, sputum, SOB, hemoptysis, pleuritic pain, palpitaitons, heartburn, abdominal pain, melena, lower extremity edema, claudication, or rash.  All other systems reviewed and negative  General: Affect appropriate Healthy:  appears stated age 77: normal Neck supple with no adenopathy JVP normal no bruits no thyromegaly Lungs clear with no wheezing and good diaphragmatic motion Heart:  S1/S2 no murmur,rub, gallop or click PMI normal Abdomen: benighn, BS positve, no tenderness, no AAA no bruit.  No HSM or HJR Distal pulses intact with no bruits Plus one bilateral edema Neuro non-focal Skin warm and dry No muscular weakness   Current Outpatient Prescriptions  Medication Sig Dispense Refill  . aspirin 81 MG tablet Take 81 mg by mouth daily.        [name] atenolol (TENORMIN) 50 MG tablet TAKE 1 TABLET ONCE DAILY  90 tablet  3  . atorvastatin (LIPITOR) 10 MG tablet TAKE 1 TABLET DAILY  90 tablet  3  . Fluticasone-Salmeterol (ADVAIR DISKUS) 250-50 MCG/DOSE AEPB Inhale 1 puff into the lungs every 12 (twelve) hours.        . furosemide (LASIX)  20 MG tablet TAKE 2 TABLETS EVERY MORNING  180 tablet  2  . guaiFENesin (MUCINEX) 600 MG 12 hr tablet Take 1,200 mg by mouth 2 (two) times daily as needed.        [name] losartan (COZAAR) 100 MG tablet TAKE 1 TABLET DAILY  90 tablet  3  . Multiple Vitamin (MULTIVITAMIN) tablet Take 1 tablet by mouth daily.        . potassium chloride (K-DUR,KLOR-CON) 10 MEQ tablet TAKE 1 TABLET DAILY  90 tablet  3  . SPIRIVA HANDIHALER 18 MCG inhalation capsule INHALE THE CONTENTS OF 1 CAPSULE BY HANDIHALER DAILY  1 capsule  3  . warfarin (COUMADIN) 2.5 MG tablet USE AS DIRECTED BY ANTICOAGULATION CLINIC.  35 tablet  3    Allergies  Review of patient's allergies indicates no known allergies.  Electrocardiogram:   afib LAD rate 77 PVC's and nospecific ST/T wave chagnes  Assessment and Plan

## 2011-05-30 NOTE — Assessment & Plan Note (Signed)
Cholesterol is at goal.  Continue current dose of statin and diet Rx.  No myalgias or side effects.  F/U  LFT's in 6 months. Lab Results  Component Value Date   LDLCALC  Value: 53        Total Cholesterol/HDL:CHD Risk Coronary Heart Disease Risk Table                     Men   Women  1/2 Average Risk   3.4   3.3  Average Risk       5.0   4.4  2 X Average Risk   9.6   7.1  3 X Average Risk  23.4   11.0        Use the calculated Patient Ratio above and the CHD Risk Table to determine the patient's CHD Risk.        ATP III CLASSIFICATION (LDL):  <100     mg/dL   Optimal  161-096  mg/dL   Near or Above                    Optimal  130-159  mg/dL   Borderline  045-409  mg/dL   High  >811     mg/dL   Very High 06/08/7828

## 2011-05-30 NOTE — Patient Instructions (Signed)
Your physician wants you to follow-up in:  6 months. You will receive a reminder letter in the mail two months in advance. If you don't receive a letter, please call our office to schedule the follow-up appointment.   

## 2011-05-30 NOTE — Assessment & Plan Note (Signed)
Good rate control and anticoagulation.  No indication for [facility].  F/U coumadin clinic today

## 2011-06-07 ENCOUNTER — Other Ambulatory Visit: Payer: Self-pay | Admitting: *Deleted

## 2011-06-07 MED ORDER — FUROSEMIDE 20 MG PO TABS: 20.0000 mg | ORAL_TABLET | Freq: Two times a day (BID) | ORAL | Status: DC

## 2011-06-27 ENCOUNTER — Encounter: Payer: Medicare Other | Admitting: *Deleted

## 2011-06-30 ENCOUNTER — Encounter: Payer: Medicare Other | Admitting: *Deleted

## 2011-07-04 ENCOUNTER — Ambulatory Visit (INDEPENDENT_AMBULATORY_CARE_PROVIDER_SITE_OTHER): Payer: Medicare Other | Admitting: *Deleted

## 2011-07-04 DIAGNOSIS — I4891 Unspecified atrial fibrillation: Secondary | ICD-10-CM

## 2011-07-04 LAB — POCT INR: INR: 3.4

## 2011-07-10 ENCOUNTER — Telehealth: Payer: Self-pay | Admitting: Pulmonary Disease

## 2011-07-10 MED ORDER — PANTOPRAZOLE SODIUM 40 MG PO TBEC: 40.0000 mg | DELAYED_RELEASE_TABLET | Freq: Every day | ORAL | Status: DC

## 2011-07-10 MED ORDER — MECLIZINE HCL 25 MG PO TABS: 25.0000 mg | ORAL_TABLET | Freq: Three times a day (TID) | ORAL | Status: DC | PRN

## 2011-07-10 NOTE — Telephone Encounter (Signed)
Spoke with patient, sent prescriptions in and patient is aware.

## 2011-07-10 NOTE — Telephone Encounter (Signed)
I spoke with pt and he c/o nausea, dizziness, and lots of indigestion x 3 days. Pt states he has vomited a couple times but none today. Pt denies nay fever, cough, wheezing, chest tightness. Pt states he is having laser eye surgery on left eye Thursday morning and wants to get this settle before his eye surgery. Pt is requesting recs from SN. Please advise Dr. Kriste Basque, thanks  No Known Allergies   Carver Fila, CMA

## 2011-07-10 NOTE — Telephone Encounter (Signed)
Per SN---recs are for 1.  PPI  protonix  40mg    1 daily   30 minutes prior to 1st meal of the day and 2.  antivert  25mg   #30   1 every 4 hours prn for dizziness.  thanks

## 2011-07-13 ENCOUNTER — Ambulatory Visit: Payer: Self-pay | Admitting: Cardiothoracic Surgery

## 2011-07-20 ENCOUNTER — Ambulatory Visit: Payer: Medicare Other | Admitting: Cardiothoracic Surgery

## 2011-07-24 ENCOUNTER — Other Ambulatory Visit: Payer: Self-pay | Admitting: Cardiovascular Disease

## 2011-07-25 ENCOUNTER — Ambulatory Visit (INDEPENDENT_AMBULATORY_CARE_PROVIDER_SITE_OTHER): Payer: Medicare Other | Admitting: *Deleted

## 2011-07-25 DIAGNOSIS — I4891 Unspecified atrial fibrillation: Secondary | ICD-10-CM

## 2011-07-25 LAB — POCT INR: INR: 2.6

## 2011-08-07 ENCOUNTER — Telehealth: Payer: Self-pay | Admitting: Pulmonary Disease

## 2011-08-07 MED ORDER — FLUTICASONE-SALMETEROL 250-50 MCG/DOSE IN AEPB: 1.0000 | INHALATION_SPRAY | Freq: Two times a day (BID) | RESPIRATORY_TRACT | Status: DC

## 2011-08-07 NOTE — Telephone Encounter (Signed)
I spoke with pt and he states he needed a 90 day supply of advair sent to [facility]. I advised pt will send rx for him. Nothing further was needed

## 2011-08-22 ENCOUNTER — Ambulatory Visit (INDEPENDENT_AMBULATORY_CARE_PROVIDER_SITE_OTHER): Payer: Medicare Other | Admitting: *Deleted

## 2011-08-22 DIAGNOSIS — I4891 Unspecified atrial fibrillation: Secondary | ICD-10-CM

## 2011-08-22 LAB — POCT INR: INR: 3.1

## 2011-08-24 ENCOUNTER — Encounter: Payer: Self-pay | Admitting: Cardiothoracic Surgery

## 2011-08-24 ENCOUNTER — Ambulatory Visit (INDEPENDENT_AMBULATORY_CARE_PROVIDER_SITE_OTHER): Payer: Medicare Other | Admitting: Cardiothoracic Surgery

## 2011-08-24 ENCOUNTER — Other Ambulatory Visit: Payer: Self-pay | Admitting: Cardiothoracic Surgery

## 2011-08-24 VITALS — BP 136/79 | HR 50 | Resp 16 | Ht 69.0 in | Wt 186.0 lb

## 2011-08-24 DIAGNOSIS — I712 Thoracic aortic aneurysm, without rupture, unspecified: Secondary | ICD-10-CM

## 2011-08-24 DIAGNOSIS — I716 Thoracoabdominal aortic aneurysm, without rupture, unspecified: Secondary | ICD-10-CM

## 2011-08-24 NOTE — Progress Notes (Signed)
[address]            [place] [address]          [phone]       [facility] Medical Record #[id number] Date of Birth: 1923-10-29  Referring: Michele Mcalpine, MD Primary Care: Michele Mcalpine, MD, MD  Chief Complaint:    Chief Complaint  Patient presents with  . Follow-up    1 yr for thoracoabdominal aneursym    History of Present Illness:     Patient returns for followup of a thoracoabdominal aneurysm. Because of his age it had been elected to not to pursue operative intervention. He denies any pain in overall feels well and remains active.     Past Medical History  Diagnosis Date  . COPD (chronic obstructive pulmonary disease)   . Pneumonia, organism unspecified   . Pulmonary nodule   . HTN (hypertension)   . CHF (congestive heart failure)   . Atrial fibrillation   . Drug therapy   . PVC (premature ventricular contraction)   . PVD (peripheral vascular disease)   . Thoracic aortic aneurysm   . Hypercholesteremia   . Diverticulosis of colon   . Colon polyps   . Prostate cancer   . DJD (degenerative joint disease)   . Actinic skin damage   . Hyperlipidemia   . Internal hemorrhoids     Past Surgical History  Procedure Date  . Total knee arthroplasty     bilateral  . Abdominal aortic aneurysm repair 1998  . Appendectomy   . Inguinal hernia repair     left    History  Smoking status  . Former Smoker -- 2.0 packs/day for 44 years  . Types: Cigarettes  . Quit date: 09/25/1988  Smokeless tobacco  . Never Used   History  Alcohol Use No    History   Social History  . Marital Status: Married    Spouse Name: N/A    Number of Children: 3  . Years of Education: N/A   Occupational History  . retired Haematologist   . [employer]    Social History Main Topics  . Smoking status: Former Smoker -- 2.0 packs/day for 44 years    Types: Cigarettes    Quit date: 09/25/1988  . Smokeless tobacco:  Never Used  . Alcohol Use: No  . Drug Use: No  . Sexually Active: Not on file   Other Topics Concern  . Not on file   Social History Narrative  . No narrative on file    No Known Allergies  Current Outpatient Prescriptions  Medication Sig Dispense Refill  . aspirin 81 MG tablet Take 81 mg by mouth daily.        [name] atenolol (TENORMIN) 50 MG tablet TAKE 1 TABLET ONCE DAILY  90 tablet  3  . atorvastatin (LIPITOR) 10 MG tablet TAKE 1 TABLET DAILY  90 tablet  3  . Fluticasone-Salmeterol (ADVAIR DISKUS) 250-50 MCG/DOSE AEPB Inhale 1 puff into the lungs every 12 (twelve) hours.  180 each  3  . furosemide (LASIX) 20 MG tablet Take 1 tablet (20 mg total) by mouth 2 (two) times daily.  180 tablet  2  . guaiFENesin (MUCINEX) 600 MG 12 hr tablet Take 1,200 mg by mouth 2 (two) times daily as needed.        [name] losartan (  COZAAR) 100 MG tablet TAKE 1 TABLET DAILY  90 tablet  3  . meclizine (ANTIVERT) 25 MG tablet Take 1 tablet (25 mg total) by mouth 3 (three) times daily as needed for dizziness or nausea.  30 tablet  1  . Multiple Vitamin (MULTIVITAMIN) tablet Take 1 tablet by mouth daily.        . pantoprazole (PROTONIX) 40 MG tablet Take 1 tablet (40 mg total) by mouth daily.  30 tablet  5  . potassium chloride (K-DUR,KLOR-CON) 10 MEQ tablet TAKE 1 TABLET DAILY  90 tablet  3  . SPIRIVA HANDIHALER 18 MCG inhalation capsule INHALE THE CONTENTS OF 1 CAPSULE BY HANDIHALER DAILY  1 capsule  3  . warfarin (COUMADIN) 2.5 MG tablet USE AS DIRECTED BY ANTICOAGULATION CLINIC.  35 tablet  3     (Not in a hospital admission)  Family History  Problem Relation Age of Onset  . Dementia Mother   . Stroke Father   . Diabetes Brother   . Heart disease Brother   . Heart disease Sister   . Heart disease Brother   . Obesity Brother   . Cancer Sister     back ??  . Alzheimer's disease Sister   . Colon cancer Neg Hx         Physical Exam: BP 136/79  Pulse 50  Resp 16  Ht 5\' 9"  (1.753 m)  Wt 186 lb  (84.369 kg)  BMI 27.47 kg/m2  SpO2 94%  General appearance: alert, cooperative and no distress Neurologic: intact Heart: regular rate and rhythm, S1, S2 normal, no murmur, click, rub or gallop, normal apical impulse and no click Lungs: clear to auscultation bilaterally Abdomen: soft, non-tender; bowel sounds normal; no masses,  no organomegaly Extremities: extremities normal, atraumatic, no cyanosis or edema, Homans sign is negative, no sign of DVT and no edema, redness or tenderness in the calves or thighs   Diagnostic Studies & Laboratory data:     Recent Radiology Findings:  Last ct scan was  07/12/2010 CT ANGIOGRAPHY CHEST, ABDOMEN AND PELVIS  Technique: Multidetector CT imaging through the chest, abdomen and  pelvis was performed using the standard protocol during bolus  administration of intravenous contrast. Multiplanar reconstructed  images including MIPs were obtained and reviewed to evaluate the  vascular anatomy.  Contrast: 100 ml Omnipaque 350  Comparison: Plain film chest of 05/04/2010. Chest CT of  03/11/2010. Abdominal CT of 05/11/2005.  CTA CHEST  Findings: Lung windows demonstrate severe centrilobular emphysema.  Probable scarring in the lateral right upper lobe on image 29.  Similar to on the prior exam.  Mild motion degradation at the lung bases.  Interval decrease in size of a cavitary lesion within the superior  segment right lower lobe. The cavitary portion measures 3.7 x 2.4  cm on image 41 versus 6.4 x 3.8 cm at the same level on the prior  exam. Similar wall thickness, measuring maximally 8 mm posteriorly  on image 46. The inferior soft tissue component is also decreased,  measuring 2.8 x 2.1 cm on image 55 versus 4.9 x 2.0 cm on the prior  exam. Along the lateral aspect of the soft tissue component a more  focal nodular area measures 1.1 cm on image 50 and was not  definitely present on the prior.  .  Soft tissue windows demonstrate normal caliber of  the ascending  aorta. Tortuous transverse and proximal descending aorta.  Fusiform dilatation of the descending segment. Wall thrombus is  eccentric to the right. At the level of the diaphragmatic hiatus,  this measures 5.4 x 5.6 cm on image 81 of series 5 versus 5.8 x 5.8  cm at the same level on the prior exam. No surrounding hemorrhage.  Cardiomegaly and coronary artery atherosclerosis. No pericardial or  pleural effusion. Stable small middle mediastinal lymph nodes. A  right infrahilar lymph node is similar 1.1 cm. Right hilar lymph  nodes measure up to 1.0 cm under also unchanged.  Dilated thoracic esophagus with air-fluid level within.  Review of the MIP images confirms the above findings.  IMPRESSION:  1. Similar appearance of descending thoracic aortic aneurysm.  2. Improvement in a partially cavitary right lower lobe process.  Favor resolving infection/abscess. This warrants ongoing follow-up  with CT approximately 3 months. On this exam, recommend special  attention to the apparent adjacent nodular density.  3. Severe centrilobular emphysema.  4. Dilated esophagus with air-fluid level. Question dysmotility.  This may predispose the patient to aspiration.  5. Similar thoracic adenopathy, likely reactive.  CTA ABDOMEN  Findings: Normal liver, spleen. Underdistended proximal stomach.  Normal pancreas, gallbladder, biliary tract, adrenal glands.  Upper pole right renal cyst. Mild bilateral renal cortical  atrophy.  Mild atherosclerotic narrowing at the left renal artery origin.  Repair of infrarenal abdominal aortic aneurysm. Extension of  thoracic aortic aneurysm into the upper abdomen. At the cranial  caudal level of the superior mesenteric artery, the aorta measures  4.2 x 4.3 cm on image 105 versus 4.0 x 3.7 cm on 05/11/2005. At  the level of the renal arteries, the aorta measures 4.2 x 3.7 cm.  No surrounding hemorrhage.  Mild motion degradation. No retroperitoneal or  retrocrural  adenopathy.  Normal abdominal bowel loops. No ascites.  Review of the MIP images confirms the above findings.  IMPRESSION:  1. Aneurysmal dilatation of the aorta continues into the  suprarenal and renal segments. Surgical changes within the  infrarenal segment. This aneurysmal dilatation is slightly  progressive since 05/11/2005.  2. Probable mild fatty infiltration of the liver.  CTA PELVIS  Findings: Normal pelvic small bowel loops. Iliac limbs of the  aortic repair opacify normally. No pelvic adenopathy. Normal  urinary bladder. Radiation seeds in the prostate. Question rectal  wall thickening on image 201. No significant free fluid. No acute  osseous abnormality. Trace L4-L5 anterolisthesis. A mild T11  compression deformity is unchanged. Review of the MIP images  confirms the above findings.  IMPRESSION:  1. Normal appearance of the iliac portions of the prior aneurysm  repair.  2. Radiation seeds in the prostate. 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   ESOPHOGRAM / BARIUM SWALLOW / BARIUM TABLET STUDY  Technique: Combined double contrast and single contrast  examination performed using effervescent crystals, thick barium  liquid, and thin barium liquid. The patient was observed with  fluoroscopy swallowing a 13mm barium sulphate tablet.  Fluoroscopy time: 1.3 minutes.  Comparison: 07/12/2010 CT  Findings: The pharynx is unremarkable.  Very poor primary esophageal contractions are noted throughout the  study.  There are no fixed filling defects, areas of fixed narrowing or  mucosal abnormalities noted within the esophagus.  The esophagus is minimally distended.  There is no evidence of hiatal hernia or gastroesophageal reflux.  IMPRESSION:  Nonspecific esophageal motility disorder with very poor primary  esophageal contractions. This is compatible with recent CT  findings.  No other significant abnormalities identified.  Provider: Windell Hummingbird, Rosendo Gros  Recent Lab  Findings: Lab Results  Component Value Date   WBC 8.7 03/15/2010   HGB 13.8 03/15/2010   HCT 40.9 03/15/2010   PLT 182.0 03/15/2010   GLUCOSE 92 07/04/2010   CHOL  Value: 78        ATP III CLASSIFICATION:  <200     mg/dL   Desirable  981-191  mg/dL   Borderline High  >=478    mg/dL   High        2/95/6213   TRIG 45 01/22/2010   HDL 16* 01/22/2010   LDLCALC  Value: 53        Total Cholesterol/HDL:CHD Risk Coronary Heart Disease Risk Table                     Men   Women  1/2 Average Risk   3.4   3.3  Average Risk       5.0   4.4  2 X Average Risk   9.6   7.1  3 X Average Risk  23.4   11.0        Use the calculated Patient Ratio above and the CHD Risk Table to determine the patient's CHD Risk.        ATP III CLASSIFICATION (LDL):  <100     mg/dL   Optimal  086-578  mg/dL   Near or Above                    Optimal  130-159  mg/dL   Borderline  469-629  mg/dL   High  >528     mg/dL   Very High 01/06/2439   ALT 31 03/15/2010   AST 43* 03/15/2010   NA 140 07/04/2010   K 4.2 07/04/2010   CL 104 07/04/2010   CREATININE 1.1 07/04/2010   BUN 14 07/04/2010   CO2 26 07/04/2010   TSH 3.52 10/25/2009   INR 3.1 08/22/2011      Assessment / Plan:     The patient appears stable without any specific complaints. Because o previous CT scan last year with pulmonary abnormalities I discussed with him a followup CT scan both to check the pulmonary situation and also to evaluate the size of his thoracoabdominal aneurysm. I'll plan to see him back just after the scan is completed       Delight Ovens MD 08/24/2011 12:11 PM

## 2011-08-28 ENCOUNTER — Ambulatory Visit (INDEPENDENT_AMBULATORY_CARE_PROVIDER_SITE_OTHER): Payer: Medicare Other | Admitting: Pulmonary Disease

## 2011-08-28 ENCOUNTER — Encounter: Payer: Self-pay | Admitting: Pulmonary Disease

## 2011-08-28 DIAGNOSIS — M1990 Unspecified osteoarthritis, unspecified site: Secondary | ICD-10-CM

## 2011-08-28 DIAGNOSIS — D126 Benign neoplasm of colon, unspecified: Secondary | ICD-10-CM

## 2011-08-28 DIAGNOSIS — I712 Thoracic aortic aneurysm, without rupture, unspecified: Secondary | ICD-10-CM

## 2011-08-28 DIAGNOSIS — R1319 Other dysphagia: Secondary | ICD-10-CM

## 2011-08-28 DIAGNOSIS — I4891 Unspecified atrial fibrillation: Secondary | ICD-10-CM

## 2011-08-28 DIAGNOSIS — J984 Other disorders of lung: Secondary | ICD-10-CM

## 2011-08-28 DIAGNOSIS — I10 Essential (primary) hypertension: Secondary | ICD-10-CM

## 2011-08-28 DIAGNOSIS — E7800 Pure hypercholesterolemia, unspecified: Secondary | ICD-10-CM

## 2011-08-28 DIAGNOSIS — J449 Chronic obstructive pulmonary disease, unspecified: Secondary | ICD-10-CM

## 2011-08-28 DIAGNOSIS — C61 Malignant neoplasm of prostate: Secondary | ICD-10-CM

## 2011-08-28 DIAGNOSIS — J4489 Other specified chronic obstructive pulmonary disease: Secondary | ICD-10-CM

## 2011-08-28 DIAGNOSIS — I509 Heart failure, unspecified: Secondary | ICD-10-CM

## 2011-08-28 DIAGNOSIS — Z23 Encounter for immunization: Secondary | ICD-10-CM

## 2011-08-28 DIAGNOSIS — K5730 Diverticulosis of large intestine without perforation or abscess without bleeding: Secondary | ICD-10-CM

## 2011-08-28 NOTE — Patient Instructions (Signed)
Today we updated your med list in our EPIC system...    Continue your current medications the same...  Please return to our office one day this week for your follow up fasting blood work...    Then please call the [facility]REE in a few days for your results...    Dial [id number] & when prompted enter your patient number followed by the # symbol...    Your patient number is:  [id number]  Stay as active as poss...  Call for any questions...  Let's continue our regular check ups.Marland KitchenMarland Kitchen

## 2011-08-29 ENCOUNTER — Telehealth: Payer: Self-pay | Admitting: Pulmonary Disease

## 2011-08-29 ENCOUNTER — Other Ambulatory Visit: Payer: Self-pay | Admitting: Cardiothoracic Surgery

## 2011-08-29 LAB — CREATININE, SERUM: Creat: 1.3 mg/dL (ref 0.50–1.35)

## 2011-08-29 LAB — BUN: BUN: 23 mg/dL (ref 6–23)

## 2011-08-29 NOTE — Telephone Encounter (Signed)
Pt not sure what Dr Sena Hitch ordered but he will just have labs drawn by Dr Kriste Basque also.  Will discuss at ov.

## 2011-08-30 ENCOUNTER — Other Ambulatory Visit (INDEPENDENT_AMBULATORY_CARE_PROVIDER_SITE_OTHER): Payer: Medicare Other

## 2011-08-30 DIAGNOSIS — E7800 Pure hypercholesterolemia, unspecified: Secondary | ICD-10-CM

## 2011-08-30 DIAGNOSIS — D126 Benign neoplasm of colon, unspecified: Secondary | ICD-10-CM

## 2011-08-30 DIAGNOSIS — I4891 Unspecified atrial fibrillation: Secondary | ICD-10-CM

## 2011-08-30 DIAGNOSIS — I10 Essential (primary) hypertension: Secondary | ICD-10-CM

## 2011-08-30 LAB — HEPATIC FUNCTION PANEL
ALT: 24 U/L (ref 0–53)
AST: 39 U/L — ABNORMAL HIGH (ref 0–37)
Albumin: 3.8 g/dL (ref 3.5–5.2)
Alkaline Phosphatase: 132 U/L — ABNORMAL HIGH (ref 39–117)
Bilirubin, Direct: 0.5 mg/dL — ABNORMAL HIGH (ref 0.0–0.3)
Total Bilirubin: 2.3 mg/dL — ABNORMAL HIGH (ref 0.3–1.2)
Total Protein: 6.8 g/dL (ref 6.0–8.3)

## 2011-08-30 LAB — CBC WITH DIFFERENTIAL/PLATELET
Basophils Absolute: 0.1 10*3/uL (ref 0.0–0.1)
Basophils Relative: 1.1 % (ref 0.0–3.0)
Eosinophils Absolute: 0.1 10*3/uL (ref 0.0–0.7)
Eosinophils Relative: 1.9 % (ref 0.0–5.0)
HCT: 41.7 % (ref 39.0–52.0)
Hemoglobin: 13.7 g/dL (ref 13.0–17.0)
Lymphocytes Relative: 19.7 % (ref 12.0–46.0)
Lymphs Abs: 1.4 10*3/uL (ref 0.7–4.0)
MCHC: 32.9 g/dL (ref 30.0–36.0)
MCV: 95.8 fl (ref 78.0–100.0)
Monocytes Absolute: 0.6 10*3/uL (ref 0.1–1.0)
Monocytes Relative: 9.1 % (ref 3.0–12.0)
Neutro Abs: 4.8 10*3/uL (ref 1.4–7.7)
Neutrophils Relative %: 68.2 % (ref 43.0–77.0)
Platelets: 186 10*3/uL (ref 150.0–400.0)
RBC: 4.35 Mil/uL (ref 4.22–5.81)
RDW: 15.4 % — ABNORMAL HIGH (ref 11.5–14.6)
WBC: 7.1 10*3/uL (ref 4.5–10.5)

## 2011-08-30 LAB — LIPID PANEL
Cholesterol: 107 mg/dL (ref 0–200)
HDL: 39.8 mg/dL (ref >39.00)
LDL Cholesterol: 60 mg/dL (ref 0–99)
Total CHOL/HDL Ratio: 3
Triglycerides: 38 mg/dL (ref 0.0–149.0)
VLDL: 7.6 mg/dL (ref 0.0–40.0)

## 2011-08-30 LAB — TSH: TSH: 6.1 u[IU]/mL — ABNORMAL HIGH (ref 0.35–5.50)

## 2011-08-30 LAB — BASIC METABOLIC PANEL
BUN: 22 mg/dL (ref 6–23)
CO2: 28 mEq/L (ref 19–32)
Calcium: 9.3 mg/dL (ref 8.4–10.5)
Chloride: 103 mEq/L (ref 96–112)
Creatinine, Ser: 1.2 mg/dL (ref 0.4–1.5)
GFR: 62.84 mL/min (ref >60.00)
Glucose, Bld: 85 mg/dL (ref 70–99)
Potassium: 4.5 mEq/L (ref 3.5–5.1)
Sodium: 140 mEq/L (ref 135–145)

## 2011-08-31 ENCOUNTER — Ambulatory Visit: Payer: Medicare Other | Admitting: Cardiothoracic Surgery

## 2011-08-31 ENCOUNTER — Encounter: Payer: Self-pay | Admitting: Cardiothoracic Surgery

## 2011-08-31 ENCOUNTER — Ambulatory Visit (INDEPENDENT_AMBULATORY_CARE_PROVIDER_SITE_OTHER): Payer: Medicare Other | Admitting: Cardiothoracic Surgery

## 2011-08-31 ENCOUNTER — Ambulatory Visit
Admission: RE | Admit: 2011-08-31 | Discharge: 2011-08-31 | Disposition: A | Discharge status: Home / Self Care | Payer: Medicare Other | Source: Ambulatory Visit | Attending: Cardiothoracic Surgery | Admitting: Cardiothoracic Surgery

## 2011-08-31 VITALS — BP 129/70 | HR 80 | Resp 16 | Ht 69.0 in | Wt 187.0 lb

## 2011-08-31 DIAGNOSIS — I712 Thoracic aortic aneurysm, without rupture, unspecified: Secondary | ICD-10-CM

## 2011-08-31 DIAGNOSIS — I716 Thoracoabdominal aortic aneurysm, without rupture, unspecified: Secondary | ICD-10-CM

## 2011-08-31 MED ORDER — IOHEXOL 300 MG/ML  SOLN
100.0000 mL | Freq: Once | INTRAMUSCULAR | Status: AC | PRN
  Administered 2011-08-31: 100 mL via INTRAVENOUS

## 2011-08-31 NOTE — Progress Notes (Signed)
[address]            [place] [address]          [phone]       [facility] Medical Record #[id number] Date of Birth: August 29, 1924  Referring: Michele Mcalpine, MD Primary Care: Michele Mcalpine, MD, MD  Chief Complaint:    Chief Complaint  Patient presents with  . Follow-up    thorocoabdominal anuerysm with ct chest    History of Present Illness:     Patient returns today for followup CT scan. He's had abdominal aortic aneurysm repair 1998 by Dr. Hart Rochester. At that time he was found to have an incidental 4.9 cm descending thoracic aneurysm at the level of the diaphragm. Followup over the years has showed this is slowly increased to just under 6 cm now. The patient is now 77 years old with some limitations due to emphysema and heart failure. In spite of this he remains active and cares for his wife.       Past Medical History  Diagnosis Date  . COPD (chronic obstructive pulmonary disease)   . Pneumonia, organism unspecified   . Pulmonary nodule   . HTN (hypertension)   . CHF (congestive heart failure)   . Atrial fibrillation   . Drug therapy   . PVC (premature ventricular contraction)   . PVD (peripheral vascular disease)   . Thoracic aortic aneurysm   . Hypercholesteremia   . Diverticulosis of colon   . Colon polyps   . Prostate cancer   . DJD (degenerative joint disease)   . Actinic skin damage   . Hyperlipidemia   . Internal hemorrhoids     Past Surgical History  Procedure Date  . Total knee arthroplasty     bilateral  . Abdominal aortic aneurysm repair 1998  . Appendectomy   . Inguinal hernia repair     left    History  Smoking status  . Former Smoker -- 2.0 packs/day for 44 years  . Types: Cigarettes  . Quit date: 09/25/1988  Smokeless tobacco  . Never Used   History  Alcohol Use No      No Known Allergies      Family History  Problem Relation Age of Onset  . Dementia Mother   .  Stroke Father   . Diabetes Brother   . Heart disease Brother   . Heart disease Sister   . Heart disease Brother   . Obesity Brother   . Cancer Sister     back ??  . Alzheimer's disease Sister   . Colon cancer Neg Hx       Physical Exam: BP 129/70  Pulse 80  Resp 16  Ht 5\' 9"  (1.753 m)  Wt 187 lb (84.823 kg)  BMI 27.62 kg/m2  SpO2 95%  General appearance: alert, cooperative and no distress Neurologic: intact Heart: regular rate and rhythm, S1, S2 normal, no murmur, click, rub or gallop Lungs: clear to auscultation bilaterally Abdomen: soft, non-tender; bowel sounds normal; no masses,  no organomegaly Extremities: extremities normal, atraumatic, no cyanosis or edema and Homans sign is negative, no sign of DVT On palpation the patient has no abdominal tenderness. He has a well-healed midline abdominal incision He has palpable femoral pulses bilaterally  Diagnostic Studies & Laboratory data:     Recent Radiology Findings:   Ct Angio Chest  W/cm &/or Wo Cm  08/31/2011  *RADIOLOGY REPORT*  Clinical Data:  Evaluate thoracic aneurysm.  CT ANGIOGRAPHY CHEST WITH CONTRAST  Technique:  Multidetector CT imaging of the chest was performed using the standard protocol during bolus administration of intravenous contrast.  Multiplanar CT image reconstructions including MIPs were obtained to evaluate the vascular anatomy.  Contrast: OMNIPAQUE IOHEXOL 300 MG/ML IV SOLN  Comparison:  Prior study 07/12/2010.  Findings:  The chest wall is unremarkable and stable.  Small scattered supraclavicular and axillary lymph nodes are noted.  No mass.  The bony thorax is intact.  Stable degenerative changes involving the thoracic spine.  The heart is mildly enlarged but stable.  Left atrial enlargement is noted.  No pericardial effusion.  No mediastinal or hilar lymphadenopathy.  There are scattered stable lymph nodes.  The esophagus is grossly normal. Mild stable esophageal dilatation with a small amount of  fluid.  The central pulmonary arteries are somewhat enlarged which may suggest pulmonary hypertension.  No findings for pulmonary emboli.  Stable marked tortuosity and ectasia of the thoracic aorta with atherosclerotic calcifications.  The ascending aorta is normal in caliber.  No dissection.  The aortic arch was tortuous and narrow extensive atherosclerotic changes.  The major arch branch vessels are unremarkable.  Stable coronary artery calcifications.  Stable aneurysmal dilatation of the distal thoracic aorta at the level of the diaphragm.  Maximal measurements are 5.9 x 5.6 cm.  This is stable. No dissection.  Examination of the lung parenchyma demonstrates severe emphysematous changes and pulmonary scarring.  There is bibasilar atelectasis and mild vascular crowding.  No focal infiltrate or edema.  The upper abdomen demonstrates fusiform aneurysmal dilatation of the ascending aorta with moderate atherosclerotic calcifications. This is stable.  Review of the MIP images confirms the above findings.  IMPRESSION:  1.  Stable markedly tortuous and ectatic thoracic aorta with aneurysmal dilatation of the descending thoracic aorta to maximum of 5.9 x 5.6 cm.  No dissection or other complications. 2.  Stable cardiac enlargement and dense coronary artery calcifications. 3.  Severe emphysematous changes and pulmonary scarring.  Original Report Authenticated By: P. Loralie Champagne, M.D.      Recent Lab Findings: Lab Results  Component Value Date   WBC 7.1 08/30/2011   HGB 13.7 08/30/2011   HCT 41.7 08/30/2011   PLT 186.0 08/30/2011   GLUCOSE 85 08/30/2011   CHOL 107 08/30/2011   TRIG 38.0 08/30/2011   HDL 39.80 08/30/2011   LDLCALC 60 08/30/2011   ALT 24 08/30/2011   AST 39* 08/30/2011   NA 140 08/30/2011   K 4.5 08/30/2011   CL 103 08/30/2011   CREATININE 1.2 08/30/2011   BUN 22 08/30/2011   CO2 28 08/30/2011   TSH 6.10* 08/30/2011   INR 3.1 08/22/2011      Assessment / Plan:     Stable appearance of  thoracoabdominal aneurysm primarily at the level of the diaphragm at its greatest diameter and an 77 -year-old male. The appearance and size of the aorta at the level of the diaphragm leaves [place] limited options as far as stent grafts. I do not believe the patient would be a candidate for open thoracoabdominal aneurysm repair. Dr. Hart Rochester has also been following the patient I will have him review the films for possible stent graft options.  I've had a extensive discussion with the patient and his son so they are clear on the diagnosis and risks of potential rupture in the future.  I plan  to see him back in 6 months or sooner depending on Dr. Candie Chroman input.        Delight Ovens MD 08/31/2011 1:04 PM

## 2011-09-11 ENCOUNTER — Encounter: Payer: Medicare Other | Admitting: *Deleted

## 2011-09-20 ENCOUNTER — Ambulatory Visit (INDEPENDENT_AMBULATORY_CARE_PROVIDER_SITE_OTHER): Payer: Medicare Other | Admitting: *Deleted

## 2011-09-20 DIAGNOSIS — I4891 Unspecified atrial fibrillation: Secondary | ICD-10-CM

## 2011-09-20 LAB — POCT INR: INR: 2.3

## 2011-09-22 ENCOUNTER — Encounter: Payer: Self-pay | Admitting: Pulmonary Disease

## 2011-09-22 NOTE — Progress Notes (Signed)
Subjective:     Patient ID: Ryan [facility], male   DOB: 1923/10/26, 77 y.o.   MRN: [id number]  HPI 77 y/o WM here for a follow up visit... he has multiple medical problems as noted below...    ~  Jun10:  147mo follow up doing well- no new complaints or concerns... he saw DrGearhart 4/10- f/u thoracoabdominal aneurysm (s/p AAA &right common iliac aneurysm repair 1998 by Windell Moulding)... 37mo CT scan showed ~no change in the 5cm aneurysm & he rec keep BP under control & f/u in another 37mo... he also had f/u DrWrenn for Urology- f/u Prostate Cancer s/p XRT finished 5/09... doing well w/o symptoms and PSA was down to 0.30- plans f/u another 147mo... finally saw Walker Kehr for 37yr cardiac f/u doing well- no change in meds and yearly ROV is planned...  ~  November 16, 2009:  Hosp 2/4 - 11/05/09 for CP- felt to be noncardiac in origin w/ cath showing nonobstructive CAD but he had new AFib, CHF w/ EF~45% w/ HK & TEE showing left atrial appendage clot... he was diuresed & meds adjusted, placed on Coumadin w/ careful f/u by Walker Kehr & the CC... he had PV f/u w/ CT to check his thoracoabd aneurysm= 5.6cm (min larger serially) & f/u planned 147mo...  from the pulm standpoint he had a bronchitic exac treated w/ Doxy, and reminded to take the Advair/ Spiriva regularly...  currently feeling better w/ Lasix/ KCl added & using inhalers regularly...  ~  March 15, 2010:  he was hosp 4/11 by [facility] w/ RLL Pneum- NOS, treated empirically w/ Vanc/ Zosyn, then Po Avelox & outpt follow up... RLL has not cleared & he developed some hemoptysis (on Coumadin for AFib) & CT Chest 03/11/10 showed emphysema w/ 6cm cavitary lesion RLL & a nodular component inferiorly, +right hilar adenop, no mediastinal adenop, ThorAA measures 5.8cm, etc...  he is an ex-smoker quit 1990 w/ 48yrs up to 2ppd prior... states he feels pretty good- no CP, mild cough, beige phlegm, no blood off the Coumadin x several days now... **we discussed f/u labs (OK-reviewed), check  tumor markers CEA=7.5, Ca19-9=11), contact IR re: needle bx of this lesion;  in the interim> Rx w/ Augmentin 875mg Bid.   **NOTE: IR, DrSchick, felt bx was hi risk w/ his emphysema (prob get pneumothorax, chest tube, etc) & rec holding off on bx for now.  ~  May 04, 2010:  continues to feel well- "I'm doing good"... no CP, no SOB, min cough w/ yellow sput (no change, no blood), walking for exercise... f/u CXR looks similar w/ no change in RLL opacity & we discussed f/u CEA level (6.5) and ROV 47mo w/ CT Chest at that time.  ~  July 15, 2010:  CT Chest shows interval decrease in size of RLL cavitary lesion & the inferiorly placed soft tissue component; otherw there is severe emphysema, cardiomeg, coronary calcif, thoracoabd aneurysm w/o change; there was an air-fluid level in a dilated esoph & pt notes some intermittent choking w/ liquids & "when my mouth is dry"> we decided to proceed w/ Ba Esophagram (nonspecific motility disorder/ presbyesoph, no HH or GERD seen, no stricture etc)... he continues to feel well- min yellow phlegm, w/o cough/ CP/ ch in dyspnea/ fever/ etc...   He saw DrWrenn 8/11- f/u PSA was back down to 0.07 & plans f/u 6 mo.   He saw Walker Kehr 9/11- cardiac stable, BP controlled, holding NSR s/p DCC on Coumadin.   He saw DrGearhardt yest- thoracoabd aneurysm  stable & f/u 1 yr.  ~  August 28, 2011:  13 month ROV & he is remarkably w/o complaints at 77 y/o w/ multisystem disease... BP well controlled, denies CP/ palpit/ ch in SOB, edema; LABS show FLP looks goodon Lip10, BMet wnl on Lasix20+K20, CBC ok & TSH borderline (we are following)...    He saw Walker Kehr 9/12 for f/u HBP, CAD, chr AFib on Coumadin, ASPVD> doing satis, cough resolved off Lisinopril (ch to Atacand), stable on meds & Coumadin- no changes made...    He saw DrGearhardt 11/12 for f/u thoraco-abdominal aneurysm, no surg due to age & comorbidities> f/u CT scan 12/12 showed severe emphysema & scarring w/ bibasilar atx  (no infiltrates no cavities), markedly tortuous & ectatic Thor Ao w/ extensive atherosclerotic changes & no change in decr thor aneurysm measuring 5.9 x 5.6cm, mild cardiomeg & dense coronary calcif as well...    He saw DrDBrodie 8/12 for f/u esoph dysmotility & hx colon polyps > known presbyesoph, no stricture, denies swallowing prob; he had neg colonoscopy 2006 (w/o recurrent polyps, +divertics/ hems); no f/u colon needed due to age & comorbidities...    He saw DrWrenn 8/12 for f/u prostate cancer dx in 2009 & treated w/ XRT completed 6/09; slowly rising PSA w/ doubling time 6-66mo; min voiding symptoms, he is considering androgen ablation if needed...          Problem List:  COPD (ICD-496) Hx of PNEUMONIA, ORGANISM UNSPECIFIED (ICD-486) PULMONARY NODULE & CAVITY RLL (ICD-518.89) - he is an ex-smoker, having quit in 1990 after 40 yrs of smoking... he has been exercising regularly and walking daily ~62mi 5-6 days per week... on ADVAIR 250Bid & SPIRIVA daily, +MUCINEX 2Bid w/ Fluids... ~  baseline CXR & CTChest w/ marked emphysema, atheromatous calcif, 5cm desc thorAA...  ~  CXR & CTA 2/11 in hosp showed cardiomeg + coronary calcif, advanced atherosclerosis of Ao w/ aneuryms 5.8cm into abd, underlying emphysema w/ interst edema & bilat effusions, no PE... ~  4/11:  RLL pneumonia which was slow to clear... ~  6/11:  f/u CXR & CT Chest 6/11 w/ 6cm cavitary area RLL & nodular component inferiorly, +hilar adenopathy, no mediastinal nodes, etc; Tumor markers showed CEA=7.5 & Ca19-9= 10.8; > IR felt lesion was too hi risk for bx, therefore contin Rx & observe. ~  8/11:  f/u CXR w/o change in RLL opac (no worsening)>> repeat CEA=6.5; plan f/u CT Chest in 55mo. ~  10/11: f/u CT Chest showed interval decrease in size of RLL cavitary lesion & the inferiorly placed soft tissue component; otherw there is severe emphysema, cardiomeg, coronary calcif, thoracoabd aneurysm w/o change; there was an air-fluid level in a  dilated esoph> check Ba Esophagram- pending. ~  12/12: f/u CTChest showed severe emphysema & scarring w/ bibasilar atx (no infiltrates no cavities), markedly tortuous & ectaticThorAo w/ extensive atherosclerotic changes & no change in decr thor aneurysm measuring 5.9 x 5.6cm, mild cardiomeg & dense coronary calcif as well...   HYPERTENSION (ICD-401.9)                               **followed by Walker Kehr for Cards... CONGESTIVE HEART FAILURE (ICD-428.0) ATRIAL FIBRILLATION (ICD-427.31) w/ clot in left atrial appendage >> resolved on f/u TEE, on COUMADIN per Cards & cardioverted. PREMATURE VENTRICULAR CONTRACTIONS, FREQUENT (ICD-427.69) - on ATENOLOL 50mg /d, LOSARTAN 100mg /d, & LASIX 20mg -2AM + KCl 68mEq/d (prev Norvasc discontinued)... BP= 126/82 and feeling better.Marland KitchenMarland Kitchen  home BP checks are "all good" w/ high= 140... hx ACE cough in the past on Lisinopril... denies HA, fatigue, visual changes, recurrentCP, palipit, syncope, dyspnea, edema, etc... ~  NuclearStressTest 1/05 was neg- no ischemia or infarct (+diaphrag attenuation), EF=56%... ~  repeat Nuclear study 6/09 was neg- no ischemia, mild inferoapic thinning, not gated due to PVCs... ~  2DEcho 6/09 showed mild dilated LV w/ EF= 45-50% but no regional wall motion abn, mild AoV calcif & AI, LA mild dil... ~  12/10:  changed from Atacand to LOSARTAN to save $$ ~  2/11:  in hosp- Cath= mild nonobstructive 3 vessel CAD, mod LVD w/ EF=40-45%Norvasc stopped & Lasix started... on Coumadin for AFib. ~  Subseq successful [facility] & holding NSR w/ PVCs...  PERIPHERAL VASCULAR DISEASE (ICD-443.9) - on ASA 81mg /d... he is s/p infrarenal AAA repair w/ right common iliac aneurysm repair (via Ao Bi-iliac graft) in 1998 by DrLawson... also has a known desc thor AA measuring ~5+cm and followed by DrGearhardt every 18 months...  ~  seen by DrGearhart 4/10 w/ CT scan showing ~5cm thoracoabdominal aneurysm w/o signif change...  ~  seen by DrGearhart 2/11 in hosp w/ sl incr  size of AAA- f/u planned 1mo. ~  also followed by PV- DrCooper. ~  seen by DrGearhardt 10/11 & stable, no change, f/u 1 yr. ~  Seen 12/12 by DrGearhardt & CTscan is stable, continue conservative approach...  HYPERCHOLESTEROLEMIA (ICD-272.0) - on LIPITOR 10mg /d...  ~  FLP 5/07 showed TChol 115, Tg 58, HDL 36, LDL 67 ~  FLP 5/08 showed TChol 118, TG 66, HDL 31, LDL 74 ~  FLP 5/09 showed TChol 126, TG 71, HDL 37, LDL 75 ~  FLP 6/10 showed TChol 116, TG 73, HDL 40, LDL 62 ~  2/11:  FLP not checked during the hospitalization... ~  FLP 4/11 in hosp showed TChol 94, TG 52, HDL 23, LDL 61 ~  FLP 12/12 on Lip10 showed TChol 107, TG 38, HDL 40, LDL 60  DIVERTICULOSIS OF COLON (ICD-562.10) & COLONIC POLYPS (ICD-211.3) - hx polyps in 2003 = tubular adenoma... ~  last colonoscopy 9/06 showed divertics, hems, no polyps.  PROSTATE CANCER (ICD-185) - eval by DrWrenn and they decided on XRT by DrKinard- finished 5/09 and he states that DrKinard "released me"... he sees DrWrenn every 6 months & they follow PSA closely (notes reviewed)... ~  8/12: f/u prostate cancer dx in 2009 & treated w/ XRT completed 6/09; slowly rising PSA w/ doubling time 6-11mo; min voiding symptoms, he is considering androgen ablation if needed...  DEGENERATIVE JOINT DISEASE (ICD-715.90) - s/p bilat TKR's, [facility]- DrOlin...  ACTINIC SKIN DAMAGE (ICD-692.70) - followed by Elnora Morrison, he knows to avoid sun exposure, use sun screen, etc...   Past Surgical History  Procedure Date  . Total knee arthroplasty     bilateral  . Abdominal aortic aneurysm repair 1998  . Appendectomy   . Inguinal hernia repair     left    Outpatient Encounter Prescriptions as of 08/28/2011  Medication Sig Dispense Refill  . aspirin 81 MG tablet Take 81 mg by mouth daily.        [name] atenolol (TENORMIN) 50 MG tablet TAKE 1 TABLET ONCE DAILY  90 tablet  3  . atorvastatin (LIPITOR) 10 MG tablet TAKE 1 TABLET DAILY  90 tablet  3  . Fluticasone-Salmeterol  (ADVAIR DISKUS) 250-50 MCG/DOSE AEPB Inhale 1 puff into the lungs every 12 (twelve) hours.  180 each  3  .  furosemide (LASIX) 20 MG tablet Take 1 tablet (20 mg total) by mouth 2 (two) times daily.  180 tablet  2  . guaiFENesin (MUCINEX) 600 MG 12 hr tablet Take 1,200 mg by mouth 2 (two) times daily as needed.        [name] losartan (COZAAR) 100 MG tablet TAKE 1 TABLET DAILY  90 tablet  3  . Multiple Vitamin (MULTIVITAMIN) tablet Take 1 tablet by mouth daily.        . pantoprazole (PROTONIX) 40 MG tablet Take 1 tablet (40 mg total) by mouth daily.  30 tablet  5  . potassium chloride (K-DUR,KLOR-CON) 10 MEQ tablet TAKE 1 TABLET DAILY  90 tablet  3  . SPIRIVA HANDIHALER 18 MCG inhalation capsule INHALE THE CONTENTS OF 1 CAPSULE BY HANDIHALER DAILY  1 capsule  3  . warfarin (COUMADIN) 2.5 MG tablet USE AS DIRECTED BY ANTICOAGULATION CLINIC.  35 tablet  3  . meclizine (ANTIVERT) 25 MG tablet Take 1 tablet (25 mg total) by mouth 3 (three) times daily as needed for dizziness or nausea.  30 tablet  1    No Known Allergies   Current Medications, Allergies, Past Medical History, Past Surgical History, Family History, and Social History were reviewed in [employer] record.   Review of Systems        See HPI - all other systems neg except as noted...  The patient complains of dyspnea on exertion.  The patient denies anorexia, fever, weight loss, weight gain, vision loss, decreased hearing, hoarseness, chest pain, syncope, peripheral edema, prolonged cough, headaches, hemoptysis, abdominal pain, melena, hematochezia, severe indigestion/heartburn, hematuria, incontinence, muscle weakness, suspicious skin lesions, transient blindness, difficulty walking, depression, unusual weight change, abnormal bleeding, enlarged lymph nodes, and angioedema.     Objective:   Physical Exam    WD, WN, 77 y/o WM in NAD... GENERAL:  Alert & oriented; pleasant & cooperative... HEENT:  [place]/AT, EOM-wnl,  PERRLA, EACs-clear, TMs-wnl, NOSE-clear, THROAT-clear & wnl. NECK:  Supple w/ fairROM; no JVD; normal carotid impulses w/o bruits; no thyromegaly or nodules palpated; no lymphadenopathy. CHEST:  decr BS bilat, scat bibasilar rales, w/o wheezing/ rhonchi/ signs of consolidation. HEART:  irregular rhythm, gr1/6 diast murmur in Ao area, without rubs or gallops detected... ABDOMEN:  Soft & nontender; normal bowel sounds; no organomegaly or masses palpated... EXT: s/p bilat TKR's, mod arthritic changes; no varicose veins/ +venous insuffic/ no edema. NEURO:  CN's intact;  gait abn; no focal neuro deficits... DERM:  No lesions noted; no rash etc...  RADIOLOGY DATA:  Reviewed in the EPIC EMR & discussed w/ the patient...  LABORATORY DATA:  Reviewed in the EPIC EMR & discussed w/ the patient...   Assessment:     COPD, Hx Pneumonia>  Prev CXR/CT abn has resolved back to baseline severe COPD/Emphysema; see f/u CT Chest report...  HBP>  Controlled on Aten50, Lasix20, Losartan 100, K10; continue same...  CAD/ CHF/ etc>  Followed by Walker Kehr & his notes are reviewed...  AFib>  On above + Coumadin followed in the CC; continue same...  Periph Vasc Dis>  S/p AAA repair 1998 by Windell Moulding; followed by DrGearhardt for thoraco-abd aneurysm which appears stable by serial CT scans...  CHOL>  FLP looks good on Lip10...  GI> Presbyesoph, Divertics, Hx polpyps>  Stable & followed by DrDBrodie...  Prostate Cancer>  Followed by DrWrenn  W/ slowly rising PSA as noted...  DJD>  Followed by DrOlin, s/p bilat TKRs...  Actinic skin changes>  Aware,  he is monitored by Derm...     Plan:     Patient's Medications  New Prescriptions   No medications on file  Previous Medications   ASPIRIN 81 MG TABLET    Take 81 mg by mouth daily.     ATENOLOL (TENORMIN) 50 MG TABLET    TAKE 1 TABLET ONCE DAILY   ATORVASTATIN (LIPITOR) 10 MG TABLET    TAKE 1 TABLET DAILY   FLUTICASONE-SALMETEROL (ADVAIR DISKUS) 250-50  MCG/DOSE AEPB    Inhale 1 puff into the lungs every 12 (twelve) hours.   FUROSEMIDE (LASIX) 20 MG TABLET    Take 1 tablet (20 mg total) by mouth 2 (two) times daily.   GUAIFENESIN (MUCINEX) 600 MG 12 HR TABLET    Take 1,200 mg by mouth 2 (two) times daily as needed.     LOSARTAN (COZAAR) 100 MG TABLET    TAKE 1 TABLET DAILY   MECLIZINE (ANTIVERT) 25 MG TABLET    Take 1 tablet (25 mg total) by mouth 3 (three) times daily as needed for dizziness or nausea.   MULTIPLE VITAMIN (MULTIVITAMIN) TABLET    Take 1 tablet by mouth daily.     PANTOPRAZOLE (PROTONIX) 40 MG TABLET    Take 1 tablet (40 mg total) by mouth daily.   POTASSIUM CHLORIDE (K-DUR,KLOR-CON) 10 MEQ TABLET    TAKE 1 TABLET DAILY   SPIRIVA HANDIHALER 18 MCG INHALATION CAPSULE    INHALE THE CONTENTS OF 1 CAPSULE BY HANDIHALER DAILY   WARFARIN (COUMADIN) 2.5 MG TABLET    USE AS DIRECTED BY ANTICOAGULATION CLINIC.  Modified Medications   No medications on file  Discontinued Medications   No medications on file

## 2011-10-09 ENCOUNTER — Telehealth: Payer: Self-pay | Admitting: Cardiothoracic Surgery

## 2011-10-18 ENCOUNTER — Ambulatory Visit (INDEPENDENT_AMBULATORY_CARE_PROVIDER_SITE_OTHER): Payer: Medicare Other | Admitting: *Deleted

## 2011-10-18 DIAGNOSIS — I4891 Unspecified atrial fibrillation: Secondary | ICD-10-CM

## 2011-10-18 LAB — POCT INR: INR: 2.5

## 2011-10-18 NOTE — Telephone Encounter (Signed)
x

## 2011-11-06 ENCOUNTER — Other Ambulatory Visit: Payer: Self-pay

## 2011-11-06 MED ORDER — FUROSEMIDE 20 MG PO TABS: 20.0000 mg | ORAL_TABLET | Freq: Two times a day (BID) | ORAL | Status: DC

## 2011-11-06 NOTE — Telephone Encounter (Signed)
..   Requested Prescriptions   Signed Prescriptions Disp Refills  . furosemide (LASIX) 20 MG tablet 180 tablet 2    Sig: Take 1 tablet (20 mg total) by mouth 2 (two) times daily.    Authorizing Provider: Wendall Stade    Ordering User: Christella Hartigan, Chibuikem Thang Judie Petit

## 2011-11-08 ENCOUNTER — Telehealth: Payer: Self-pay | Admitting: Cardiovascular Disease

## 2011-11-08 MED ORDER — FUROSEMIDE 20 MG PO TABS: 20.0000 mg | ORAL_TABLET | Freq: Two times a day (BID) | ORAL | Status: DC

## 2011-11-08 NOTE — Telephone Encounter (Signed)
Refill   Patient calling to f/u on furosemide (LASIX) 20 MG tablet refill .  Notes state order processed on 11/05/10.  This RX refill should have went to  Newly verified pharmacy [facility]. Patient can be reached at hm# should there be any additional questions.

## 2011-11-15 ENCOUNTER — Ambulatory Visit (INDEPENDENT_AMBULATORY_CARE_PROVIDER_SITE_OTHER): Payer: Medicare Other | Admitting: Pharmacist

## 2011-11-15 DIAGNOSIS — I4891 Unspecified atrial fibrillation: Secondary | ICD-10-CM

## 2011-11-15 LAB — POCT INR: INR: 3.3

## 2011-11-29 ENCOUNTER — Other Ambulatory Visit: Payer: Self-pay | Admitting: Cardiovascular Disease

## 2011-11-30 ENCOUNTER — Encounter: Payer: Self-pay | Admitting: Cardiovascular Disease

## 2011-11-30 ENCOUNTER — Ambulatory Visit (INDEPENDENT_AMBULATORY_CARE_PROVIDER_SITE_OTHER): Payer: Medicare Other | Admitting: Cardiovascular Disease

## 2011-11-30 ENCOUNTER — Ambulatory Visit (INDEPENDENT_AMBULATORY_CARE_PROVIDER_SITE_OTHER): Payer: Medicare Other | Admitting: *Deleted

## 2011-11-30 DIAGNOSIS — I712 Thoracic aortic aneurysm, without rupture, unspecified: Secondary | ICD-10-CM

## 2011-11-30 DIAGNOSIS — J449 Chronic obstructive pulmonary disease, unspecified: Secondary | ICD-10-CM

## 2011-11-30 DIAGNOSIS — I10 Essential (primary) hypertension: Secondary | ICD-10-CM

## 2011-11-30 DIAGNOSIS — J4489 Other specified chronic obstructive pulmonary disease: Secondary | ICD-10-CM

## 2011-11-30 DIAGNOSIS — Z79899 Other long term (current) drug therapy: Secondary | ICD-10-CM

## 2011-11-30 DIAGNOSIS — I4891 Unspecified atrial fibrillation: Secondary | ICD-10-CM

## 2011-11-30 LAB — BASIC METABOLIC PANEL
BUN: 22 mg/dL (ref 6–23)
CO2: 29 mEq/L (ref 19–32)
Calcium: 9.5 mg/dL (ref 8.4–10.5)
Chloride: 102 mEq/L (ref 96–112)
Creatinine, Ser: 1.4 mg/dL (ref 0.4–1.5)
GFR: 52.79 mL/min — ABNORMAL LOW (ref >60.00)
Glucose, Bld: 92 mg/dL (ref 70–99)
Potassium: 4.6 mEq/L (ref 3.5–5.1)
Sodium: 140 mEq/L (ref 135–145)

## 2011-11-30 LAB — POCT INR: INR: 1.9

## 2011-11-30 NOTE — Assessment & Plan Note (Signed)
Age an comorbidities preclude open repair.  Dr Tyrone Sage and Hart Rochester arranged stenting at [facility]

## 2011-11-30 NOTE — Patient Instructions (Addendum)
Your physician recommends that you schedule a follow-up appointment in: MAY  WITH DR [facility] Your physician recommends that you continue on your current medications as directed. Please refer to the Current Medication list given to you today. Your physician recommends that you return for lab work in: TODAY BMET  DX V58.69

## 2011-11-30 NOTE — Assessment & Plan Note (Signed)
Good rate control  Will stop coumadin 5 days before stent graft

## 2011-11-30 NOTE — Assessment & Plan Note (Signed)
Well controlled.  Continue current medications and low sodium Dash type diet.    

## 2011-11-30 NOTE — Progress Notes (Signed)
Granieri returns today for followup. He is doing fairly well. He has a  history of aortic aneurysm repair by Dr. Tyrone Sage. He has residual  descending thoracic aneurysm that he is following. He has hypertension.  The last time I saw him had a bit of a cough and we stopped his  lisinopril and switched him to Atacand. His cough seems to be improved.  He is not having any chest pain, PND, or orthopnea. He is active. He  walks on a regular basis. He tends towards bradycardia and has had  occasional PVCs. Chronic afib on coumadin with no bleeding problems  He had a Myoview study done March 24, 2008, which was  nonischemic. EF was not calculated due to PVCs. His EF by echo on March 24, 2008, was 45-50%. We had him on low-dose atenolol and this seems to  have settled him down.   Will see Dr Pattricia Boss at [facility] 4/18 for stent procedure  ROS: Denies fever, malais, weight loss, blurry vision, decreased visual acuity, cough, sputum, SOB, hemoptysis, pleuritic pain, palpitaitons, heartburn, abdominal pain, melena, lower extremity edema, claudication, or rash.  All other systems reviewed and negative  General: Affect appropriate Healthy:  appears stated age HEENT: normal Neck supple with no adenopathy JVP normal no bruits no thyromegaly Lungs clear with no wheezing and good diaphragmatic motion Heart:  S1/S2 no murmur, no rub, gallop or click PMI normal Abdomen: benighn, BS positve, no tenderness, AAA palpable not tender no bruit.  No HSM or HJR Distal pulses intact with no bruits No edema Neuro non-focal Skin warm and dry No muscular weakness   Current Outpatient Prescriptions  Medication Sig Dispense Refill  . aspirin 81 MG tablet Take 81 mg by mouth daily.        [name] atenolol (TENORMIN) 50 MG tablet TAKE 1 TABLET ONCE DAILY  90 tablet  3  . atorvastatin (LIPITOR) 10 MG tablet TAKE 1 TABLET DAILY  90 tablet  3  . Fluticasone-Salmeterol (ADVAIR DISKUS) 250-50 MCG/DOSE AEPB Inhale 1 puff into the  lungs every 12 (twelve) hours.  180 each  3  . furosemide (LASIX) 20 MG tablet Take 1 tablet (20 mg total) by mouth 2 (two) times daily.  180 tablet  2  . guaiFENesin (MUCINEX) 600 MG 12 hr tablet Take 1,200 mg by mouth 2 (two) times daily as needed.        [name] losartan (COZAAR) 100 MG tablet TAKE 1 TABLET DAILY  90 tablet  3  . Multiple Vitamin (MULTIVITAMIN) tablet Take 1 tablet by mouth daily.        . potassium chloride (K-DUR,KLOR-CON) 10 MEQ tablet TAKE 1 TABLET DAILY  90 tablet  3  . SPIRIVA HANDIHALER 18 MCG inhalation capsule INHALE THE CONTENTS OF 1 CAPSULE BY HANDIHALER DAILY  1 capsule  3  . warfarin (COUMADIN) 2.5 MG tablet USE AS DIRECTED BY ANTICOAGULATION CLINIC.  35 tablet  3    Allergies  Review of patient's allergies indicates no known allergies.  Electrocardiogram:  Afib rate 73  PVC;s  Assessment and Plan

## 2011-11-30 NOTE — Assessment & Plan Note (Signed)
Cholesterol is at goal.  Continue current dose of statin and diet Rx.  No myalgias or side effects.  F/U  LFT's in 6 months. Lab Results  Component Value Date   LDLCALC 60 08/30/2011             

## 2011-11-30 NOTE — Assessment & Plan Note (Signed)
Stable  Dyspnea improved since he has lost weight

## 2011-12-01 ENCOUNTER — Telehealth: Payer: Self-pay | Admitting: Cardiovascular Disease

## 2011-12-01 NOTE — Telephone Encounter (Signed)
Fu  Call Patient returning your call

## 2011-12-01 NOTE — Telephone Encounter (Signed)
PT AWARE OF LAB RESULTS./CY 

## 2011-12-19 ENCOUNTER — Ambulatory Visit (INDEPENDENT_AMBULATORY_CARE_PROVIDER_SITE_OTHER): Payer: Medicare Other | Admitting: Internal Medicine

## 2011-12-19 DIAGNOSIS — R0602 Shortness of breath: Secondary | ICD-10-CM

## 2011-12-19 DIAGNOSIS — J449 Chronic obstructive pulmonary disease, unspecified: Secondary | ICD-10-CM

## 2011-12-19 DIAGNOSIS — J4489 Other specified chronic obstructive pulmonary disease: Secondary | ICD-10-CM

## 2011-12-19 LAB — PULMONARY FUNCTION TEST

## 2011-12-19 NOTE — Progress Notes (Signed)
PFT done today. 

## 2011-12-25 ENCOUNTER — Ambulatory Visit (HOSPITAL_COMMUNITY): Payer: Medicare Other | Attending: Cardiology

## 2011-12-25 ENCOUNTER — Other Ambulatory Visit: Payer: Self-pay

## 2011-12-25 ENCOUNTER — Other Ambulatory Visit (HOSPITAL_COMMUNITY): Payer: Self-pay | Admitting: Pulmonary Disease

## 2011-12-25 DIAGNOSIS — E785 Hyperlipidemia, unspecified: Secondary | ICD-10-CM | POA: Insufficient documentation

## 2011-12-25 DIAGNOSIS — J4489 Other specified chronic obstructive pulmonary disease: Secondary | ICD-10-CM | POA: Insufficient documentation

## 2011-12-25 DIAGNOSIS — I716 Thoracoabdominal aortic aneurysm, without rupture, unspecified: Secondary | ICD-10-CM

## 2011-12-25 DIAGNOSIS — I712 Thoracic aortic aneurysm, without rupture, unspecified: Secondary | ICD-10-CM

## 2011-12-25 DIAGNOSIS — I4891 Unspecified atrial fibrillation: Secondary | ICD-10-CM

## 2011-12-25 DIAGNOSIS — J449 Chronic obstructive pulmonary disease, unspecified: Secondary | ICD-10-CM | POA: Insufficient documentation

## 2011-12-25 DIAGNOSIS — I10 Essential (primary) hypertension: Secondary | ICD-10-CM | POA: Insufficient documentation

## 2011-12-26 ENCOUNTER — Telehealth: Payer: Self-pay | Admitting: Pharmacist

## 2011-12-26 ENCOUNTER — Encounter (HOSPITAL_COMMUNITY): Payer: Self-pay | Admitting: Pulmonary Disease

## 2011-12-26 NOTE — Telephone Encounter (Signed)
Patient called to cancel his coumadin appointment on 4/4 since he is scheduled for surgery at [facility] on Monday, April 8th.  He is taking his last dose of coumadin today and being bridged with Lovenox (managed by Dr. Pattricia Boss at [facility]).  He was instructed to call us to schedule a follow-up appointment once he restarts his coumadin.

## 2012-01-05 ENCOUNTER — Observation Stay (HOSPITAL_COMMUNITY): Payer: Medicare Other

## 2012-01-05 ENCOUNTER — Observation Stay (HOSPITAL_COMMUNITY)
Admission: EM | Admit: 2012-01-05 | Discharge: 2012-01-05 | Disposition: A | Discharge status: Home / Self Care | Payer: Medicare Other | Attending: Emergency Medicine | Admitting: Emergency Medicine

## 2012-01-05 ENCOUNTER — Emergency Department (HOSPITAL_COMMUNITY): Payer: Medicare Other

## 2012-01-05 ENCOUNTER — Encounter (HOSPITAL_COMMUNITY): Payer: Self-pay | Admitting: Emergency Medicine

## 2012-01-05 DIAGNOSIS — J449 Chronic obstructive pulmonary disease, unspecified: Secondary | ICD-10-CM | POA: Insufficient documentation

## 2012-01-05 DIAGNOSIS — IMO0002 Reserved for concepts with insufficient information to code with codable children: Secondary | ICD-10-CM | POA: Insufficient documentation

## 2012-01-05 DIAGNOSIS — M47817 Spondylosis without myelopathy or radiculopathy, lumbosacral region: Principal | ICD-10-CM | POA: Insufficient documentation

## 2012-01-05 DIAGNOSIS — Y849 Medical procedure, unspecified as the cause of abnormal reaction of the patient, or of later complication, without mention of misadventure at the time of the procedure: Secondary | ICD-10-CM | POA: Insufficient documentation

## 2012-01-05 DIAGNOSIS — G8220 Paraplegia, unspecified: Secondary | ICD-10-CM

## 2012-01-05 DIAGNOSIS — R209 Unspecified disturbances of skin sensation: Secondary | ICD-10-CM | POA: Insufficient documentation

## 2012-01-05 DIAGNOSIS — J4489 Other specified chronic obstructive pulmonary disease: Secondary | ICD-10-CM | POA: Insufficient documentation

## 2012-01-05 DIAGNOSIS — M5137 Other intervertebral disc degeneration, lumbosacral region: Secondary | ICD-10-CM | POA: Insufficient documentation

## 2012-01-05 DIAGNOSIS — M51379 Other intervertebral disc degeneration, lumbosacral region without mention of lumbar back pain or lower extremity pain: Secondary | ICD-10-CM | POA: Insufficient documentation

## 2012-01-05 DIAGNOSIS — I10 Essential (primary) hypertension: Secondary | ICD-10-CM | POA: Insufficient documentation

## 2012-01-05 LAB — PROTIME-INR
INR: 1.12 (ref 0.00–1.49)
Prothrombin Time: 14.6 seconds (ref 11.6–15.2)

## 2012-01-05 LAB — CBC
HCT: 39.3 % (ref 39.0–52.0)
Hemoglobin: 13.9 g/dL (ref 13.0–17.0)
MCH: 32.1 pg (ref 26.0–34.0)
MCHC: 35.4 g/dL (ref 30.0–36.0)
MCV: 90.8 fL (ref 78.0–100.0)
Platelets: 162 10*3/uL (ref 150–400)
RBC: 4.33 MIL/uL (ref 4.22–5.81)
RDW: 15.2 % (ref 11.5–15.5)
WBC: 8.3 10*3/uL (ref 4.0–10.5)

## 2012-01-05 LAB — BASIC METABOLIC PANEL
BUN: 21 mg/dL (ref 6–23)
CO2: 23 mEq/L (ref 19–32)
Calcium: 9.2 mg/dL (ref 8.4–10.5)
Chloride: 97 mEq/L (ref 96–112)
Creatinine, Ser: 0.93 mg/dL (ref 0.50–1.35)
GFR calc Af Amer: 86 mL/min — ABNORMAL LOW (ref >90)
GFR calc non Af Amer: 74 mL/min — ABNORMAL LOW (ref >90)
Glucose, Bld: 110 mg/dL — ABNORMAL HIGH (ref 70–99)
Potassium: 4.2 mEq/L (ref 3.5–5.1)
Sodium: 134 mEq/L — ABNORMAL LOW (ref 135–145)

## 2012-01-05 LAB — APTT: aPTT: 37 seconds (ref 24–37)

## 2012-01-05 MED ORDER — SODIUM CHLORIDE 0.9 % IV SOLN
1000.0000 mL | INTRAVENOUS | Status: DC
  Administered 2012-01-05 (×2): 1000 mL via INTRAVENOUS

## 2012-01-05 MED ORDER — HYDROMORPHONE HCL PF 1 MG/ML IJ SOLN
1.0000 mg | Freq: Once | INTRAMUSCULAR | Status: AC
  Administered 2012-01-05: 1 mg via INTRAVENOUS

## 2012-01-05 MED ORDER — HYDROMORPHONE HCL PF 1 MG/ML IJ SOLN
1.0000 mg | Freq: Four times a day (QID) | INTRAMUSCULAR | Status: DC | PRN
  Administered 2012-01-05: 1 mg via INTRAVENOUS
  Filled 2012-01-05 (×2): qty 1

## 2012-01-05 MED ORDER — HYDROMORPHONE HCL PF 1 MG/ML IJ SOLN
INTRAMUSCULAR | Status: AC
  Administered 2012-01-05: 1 mg via INTRAVENOUS
  Filled 2012-01-05: qty 1

## 2012-01-05 MED ORDER — ACETAMINOPHEN 325 MG PO TABS: 650.0000 mg | ORAL_TABLET | ORAL | Status: DC | PRN

## 2012-01-05 MED ORDER — ONDANSETRON HCL 4 MG/2ML IJ SOLN
4.0000 mg | Freq: Four times a day (QID) | INTRAMUSCULAR | Status: DC | PRN
  Administered 2012-01-05: 4 mg via INTRAVENOUS
  Filled 2012-01-05: qty 2

## 2012-01-05 MED ORDER — OXYCODONE-ACETAMINOPHEN 5-325 MG PO TABS: 1.0000 | ORAL_TABLET | Freq: Four times a day (QID) | ORAL | Status: DC | PRN

## 2012-01-05 MED ORDER — HYDROMORPHONE HCL PF 1 MG/ML IJ SOLN
1.0000 mg | Freq: Once | INTRAMUSCULAR | Status: AC
  Administered 2012-01-05: 1 mg via INTRAVENOUS
  Filled 2012-01-05: qty 1

## 2012-01-05 MED ORDER — GADOBENATE DIMEGLUMINE 529 MG/ML IV SOLN
17.0000 mL | Freq: Once | INTRAVENOUS | Status: AC | PRN
  Administered 2012-01-05: 17 mL via INTRAVENOUS

## 2012-01-05 NOTE — ED Notes (Signed)
Called [facility] to check on bed status. They advise may be hours before pt gets a bed assigned

## 2012-01-05 NOTE — ED Notes (Signed)
[facility] being called to transport pt to [facility]

## 2012-01-05 NOTE — ED Notes (Signed)
Pt mucous membranes are dry. He has multiple bruises to his extremeties.; he denies abdominal pain. States he has had this pain since surgery Monday. Reports near fall on Thursday when leaving the hospital but family states he did not hit the ground. He denies diff urinating. Bowel sounds are present.

## 2012-01-05 NOTE — Progress Notes (Signed)
Observation review is complete. 

## 2012-01-05 NOTE — H&P (Signed)
Subjective: The patient is an 78 year old white male who underwent an endovascular repair of a thoracic aneurysm/stenting on 01/01/2012 at [facility]. As part of that procedure a lumbar drain was placed. This was removed on postop day #1. According to the patient's family the patient initially was ambulating well. But as the week went on the patient developed progressive back pain and weakness in his lower extremities. In fact they noted that when he was discharged yesterday he could hardly stand and had to be helped into the car. This has persisted and they brought the patient to the [facility] emergency department where he was evaluated by Dr. Windle Guard. The evaluation included a lumbar MRI which demonstrated a lumbar epidural versus subdural hematoma. A neurosurgical consultation was requested.  Presently the patient is accompanied by his son and daughter who provide a lot of the details. The patient has been on Lovenox since discharge his last dose was last evening. According to the family the patient has been weak in the legs as above over the last several days. The patient complains of back pain, he denies perineal numbness. He has normal perineal sensation and sphincter tone.   Past Medical History  Diagnosis Date  . COPD (chronic obstructive pulmonary disease)   . Pneumonia, organism unspecified   . Pulmonary nodule   . HTN (hypertension)   . CHF (congestive heart failure)   . Atrial fibrillation   . Drug therapy   . PVC (premature ventricular contraction)   . PVD (peripheral vascular disease)   . Thoracic aortic aneurysm   . Hypercholesteremia   . Diverticulosis of colon   . Colon polyps   . Prostate cancer   . DJD (degenerative joint disease)   . Actinic skin damage   . Hyperlipidemia   . Internal hemorrhoids     Past Surgical History  Procedure Date  . Total knee arthroplasty     bilateral  . Abdominal aortic aneurysm repair 1998  . Appendectomy   . Inguinal hernia repair      left    No Known Allergies  History  Substance Use Topics  . Smoking status: Former Smoker -- 2.0 packs/day for 44 years    Types: Cigarettes    Quit date: 09/25/1988  . Smokeless tobacco: Never Used  . Alcohol Use: No    Family History  Problem Relation Age of Onset  . Dementia Mother   . Stroke Father   . Diabetes Brother   . Heart disease Brother   . Heart disease Sister   . Heart disease Brother   . Obesity Brother   . Cancer Sister     back ??  . Alzheimer's disease Sister   . Colon cancer Neg Hx    Prior to Admission medications   Medication Sig Start Date End Date Taking? Authorizing Provider  aspirin 81 MG tablet Take 81 mg by mouth daily.     Yes Historical Provider, MD  atenolol (TENORMIN) 50 MG tablet TAKE 1 TABLET ONCE DAILY 03/30/11  Yes Josue Hector, MD  atorvastatin (LIPITOR) 10 MG tablet TAKE 1 TABLET DAILY 02/07/11  Yes Noralee Space, MD  Fluticasone-Salmeterol (ADVAIR DISKUS) 250-50 MCG/DOSE AEPB Inhale 1 puff into the lungs every 12 (twelve) hours. 08/07/11 08/06/12 Yes Noralee Space, MD  furosemide (LASIX) 20 MG tablet Take 1 tablet (20 mg total) by mouth 2 (two) times daily. 11/08/11  Yes Josue Hector, MD  guaiFENesin (MUCINEX) 600 MG 12 hr tablet Take 1,200  mg by mouth 2 (two) times daily as needed.     Yes Historical Provider, MD  losartan (COZAAR) 100 MG tablet TAKE 1 TABLET DAILY 03/22/11  Yes Noralee Space, MD  Multiple Vitamin (MULTIVITAMIN) tablet Take 1 tablet by mouth daily.     Yes Historical Provider, MD  potassium chloride (K-DUR,KLOR-CON) 10 MEQ tablet TAKE 1 TABLET DAILY 02/27/11  Yes Noralee Space, MD  SPIRIVA HANDIHALER 18 MCG inhalation capsule INHALE THE CONTENTS OF 1 CAPSULE BY HANDIHALER DAILY 02/14/11  Yes Noralee Space, MD  warfarin (COUMADIN) 2.5 MG tablet Take 2.5 mg by mouth daily.   Yes Historical Provider, MD     Review of Systems  Positive ROS: As above  All other systems have been reviewed and were otherwise negative with  the exception of those mentioned in the HPI and as above.  Objective: Vital signs in last 24 hours: Temp:  [98.5 F (36.9 C)-98.7 F (37.1 C)] 98.5 F (36.9 C) (04/12 1000) Pulse Rate:  [52-110] 52  (04/12 1000) Resp:  [17-22] 20  (04/12 1000) BP: (123-137)/(63-88) 123/63 mmHg (04/12 1000) SpO2:  [94 %-99 %] 95 % (04/12 1000)  General Appearance: Alert, cooperative, mildly somnolent, but easily arousable no distress, appears stated age Head: Normocephalic, without obvious abnormality, atraumatic Eyes: PERRL, conjunctiva/corneas clear, EOM's intact, fundi benign, both eyes      Ears: Normal TM's and external ear canals, both ears Throat: Lips, mucosa, and tongue normal; teeth and gums normal Neck: Supple, symmetrical, trachea midline, no adenopathy; thyroid: No enlargement/tenderness/nodules; no carotid bruit or JVD Back: Symmetric, no curvature, ROM normal, no CVA tenderness no obvious deformities Lungs: Clear to auscultation bilaterally, respirations unlabored Heart: Regular rate and rhythm, Abdomen: Soft, non-tender, bowel sounds active all four quadrants, no masses, no organomegaly Extremities: Extremities normal, atraumatic, no cyanosis or edema Pulses: Diminished in his lower extremities Skin: Skin color, texture, turgor normal, no rashes or lesions  NEUROLOGIC:   Mental status: alert and oriented, Motor Exam - grossly normal in his upper extremities. The patient's motor strength is 4-4+ over 5 in his bilateral psoas quadriceps and gastrocnemius. Patient has weakness in his bilateral dorsiflexors at 1/5. Sensory Exam - grossly normal to light touch in all 4 extremities. Reflexes: 1-2/4 in his bilateral biceps, abscess gastrocnemius. Coordination - grossly normal in his upper extremities Gait - not tested Balance - not tested  Cranial Nerves: I: smell Not tested  II: visual acuity  OS: Grossly normal    OD: Grossly normal   II: visual fields Full to confrontation  II:  pupils Equal, round, reactive to light  III,VII: ptosis None  III,IV,VI: extraocular muscles  Full ROM  V: mastication Normal  V: facial light touch sensation  Normal  V,VII: corneal reflex  Present  VII: facial muscle function - upper  Normal  VII: facial muscle function - lower Normal  VIII: hearing Not tested  IX: soft palate elevation  Normal  IX,X: gag reflex Present  XI: trapezius strength  5/5  XI: sternocleidomastoid strength 5/5  XI: neck flexion strength  5/5  XII: tongue strength  Normal    Data Review Lab Results  Component Value Date   WBC 8.3 01/05/2012   HGB 13.9 01/05/2012   HCT 39.3 01/05/2012   MCV 90.8 01/05/2012   PLT 162 01/05/2012   Lab Results  Component Value Date   NA 134* 01/05/2012   K 4.2 01/05/2012   CL 97 01/05/2012   CO2 23 01/05/2012  BUN 21 01/05/2012   CREATININE 0.93 01/05/2012   GLUCOSE 110* 01/05/2012   Lab Results  Component Value Date   INR 1.12 01/05/2012    Assessment/Plan: L1-2 disc degeneration, spondylosis, spondylosis, L2-3 epidural versus subdural hematoma: I discussed the situation with the patient and his children. It seems the patient has been weak for several days. The best I can tell his weakness has been stable in his lower extremities i.e. is not rapidly progressing. I have therefore recommended the patient be transferred back to [facility] for further management of their patient. If this is not possible or it cannot happen in a timely fashion, I will be glad to operate  on the patient here. I discussed the situation with Dr. Windle Guard. He is going to arrange transfer.   Daquan Crapps D 01/05/2012 11:20 AM

## 2012-01-05 NOTE — ED Notes (Addendum)
Patient with history of recent AAA repair at [facility], patient fell yesterday, having increased back pain since fall.  Patient has been taking pain meds, patient's family stopped given meds due to patient becoming "loopy".  Patient with bruise from epidural done on Wednesday for AAA repair.

## 2012-01-05 NOTE — ED Notes (Signed)
Patient transported to X-ray 

## 2012-01-05 NOTE — ED Provider Notes (Signed)
History     CSN: [id number]  Arrival date & time 01/05/12  0206   First MD Initiated Contact with Patient 01/05/12 [phone]      Chief Complaint  Patient presents with  . Back Pain    (Consider location/radiation/quality/duration/timing/severity/associated sxs/prior treatment) HPI Comments: The patient is an 78 year old male with a history of prior AAA repair 7 or 8 years ago, hypertension, CHF, hyperlipidemia, COPD. He presents to 3 days after having a repeat intravascular repair of his aneurysm. He states that on leaving the hospital at [facility] after his surgery, he fell when he stumbled trying to get into the car landing on his lower back. Since that time he has had significant lower back pain described as aching and severe and radiating down both of his legs. He has had difficulty walking and has fallen again while at home which causes his pain to be worse and severe. He denies dysuria, urinary retention or incontinence, numbness of the lower extremities or weakness of the lower extremities. He does note that it hurts his back to move his legs. The patient he received an epidural for the procedure. He denies abdominal pain, nausea vomiting shortness of breath headache.  The history is provided by the patient and the EMS personnel.    Past Medical History  Diagnosis Date  . COPD (chronic obstructive pulmonary disease)   . Pneumonia, organism unspecified   . Pulmonary nodule   . HTN (hypertension)   . CHF (congestive heart failure)   . Atrial fibrillation   . Drug therapy   . PVC (premature ventricular contraction)   . PVD (peripheral vascular disease)   . Thoracic aortic aneurysm   . Hypercholesteremia   . Diverticulosis of colon   . Colon polyps   . Prostate cancer   . DJD (degenerative joint disease)   . Actinic skin damage   . Hyperlipidemia   . Internal hemorrhoids     Past Surgical History  Procedure Date  . Total knee arthroplasty     bilateral  . Abdominal  aortic aneurysm repair 1998  . Appendectomy   . Inguinal hernia repair     left    Family History  Problem Relation Age of Onset  . Dementia Mother   . Stroke Father   . Diabetes Brother   . Heart disease Brother   . Heart disease Sister   . Heart disease Brother   . Obesity Brother   . Cancer Sister     back ??  . Alzheimer's disease Sister   . Colon cancer Neg Hx     History  Substance Use Topics  . Smoking status: Former Smoker -- 2.0 packs/day for 44 years    Types: Cigarettes    Quit date: 09/25/1988  . Smokeless tobacco: Never Used  . Alcohol Use: No      Review of Systems  All other systems reviewed and are negative.    Allergies  Review of patient's allergies indicates no known allergies.  Home Medications   Current Outpatient Rx  Name Route Sig Dispense Refill  . ASPIRIN 81 MG PO TABS Oral Take 81 mg by mouth daily.      . ATENOLOL 50 MG PO TABS  TAKE 1 TABLET ONCE DAILY 90 tablet 3  . ATORVASTATIN CALCIUM 10 MG PO TABS  TAKE 1 TABLET DAILY 90 tablet 3  . FLUTICASONE-SALMETEROL 250-50 MCG/DOSE IN AEPB Inhalation Inhale 1 puff into the lungs every 12 (twelve) hours. 180 each 3  .  FUROSEMIDE 20 MG PO TABS Oral Take 1 tablet (20 mg total) by mouth 2 (two) times daily. 180 tablet 2  . GUAIFENESIN ER 600 MG PO TB12 Oral Take 1,200 mg by mouth 2 (two) times daily as needed.      [name] LOSARTAN POTASSIUM 100 MG PO TABS  TAKE 1 TABLET DAILY 90 tablet 3  . ONE-DAILY MULTI VITAMINS PO TABS Oral Take 1 tablet by mouth daily.      [name] POTASSIUM CHLORIDE CRYS ER 10 MEQ PO TBCR  TAKE 1 TABLET DAILY 90 tablet 3  . SPIRIVA HANDIHALER 18 MCG IN CAPS  INHALE THE CONTENTS OF 1 CAPSULE BY HANDIHALER DAILY 1 capsule 3  . WARFARIN SODIUM 2.5 MG PO TABS  USE AS DIRECTED BY ANTICOAGULATION CLINIC. 35 tablet 3    There were no vitals taken for this visit.  Physical Exam  Nursing note and vitals reviewed. Constitutional: He appears well-developed and well-nourished.        Uncomfortable appearing  HENT:  Head: Normocephalic and atraumatic.  Mouth/Throat: Oropharynx is clear and moist. No oropharyngeal exudate.  Eyes: Conjunctivae and EOM are normal. Pupils are equal, round, and reactive to light. Right eye exhibits no discharge. Left eye exhibits no discharge. No scleral icterus.  Neck: Normal range of motion. Neck supple. No JVD present. No thyromegaly present.  Cardiovascular: Normal rate, regular rhythm, normal heart sounds and intact distal pulses.  Exam reveals no gallop and no friction rub.   No murmur heard.      Frequent ectopy  Pulmonary/Chest: Effort normal and breath sounds normal. No respiratory distress. He has no wheezes. He has no rales.  Abdominal: Soft. Bowel sounds are normal. He exhibits no distension and no mass. There is no tenderness.       Nontender abdomen, no pulsating mass palpated  Genitourinary:       Puncture wound sites in the bilateral groins, small incision to the right groin healing well, normal sensation in the perineum, normal rectal tone  Musculoskeletal: Normal range of motion. He exhibits tenderness ( Movement of the lower extremities elicits pain in the back). He exhibits no edema.       Tenderness to palpation in the mid to lower back, no obvious deformities, lumbar puncture site identified without any swelling redness warmth or deformity  Lymphadenopathy:    He has no cervical adenopathy.  Neurological: He is alert. Coordination normal.  Skin: Skin is warm and dry. No rash noted. No erythema.       Surgical sites as noted  Psychiatric: He has a normal mood and affect. His behavior is normal.    ED Course  Procedures (including critical care time)  ED ECG REPORT   Date: 01/05/2012   Rate: 119  Rhythm: atrial fibrillation  QRS Axis: left  Intervals: Atrial fibrillation, frequent PVCs, normal QRS appears less than 100 ms  ST/T Wave abnormalities: nonspecific T wave changes  Conduction Disutrbances:Frequent PVCs,  no other acute findings  Narrative Interpretation:   Old EKG Reviewed: changes noted compared with EKG from 01/22/2010, PVCs found and have persisted, atrial fibrillation appears new though there is frequent ectopy that prevents determining true rhythm from this EKG.   Labs Reviewed - No data to display No results found.   No diagnosis found.    MDM  Patient has back pain likely secondary to his fall, there is no focal neurologic deficits though he does have the sensation of paresthesias running down both of his legs. He appears  to be significantly uncomfortable and only has normal rectal tone and no urinary complaints all have to get imaging in the emergency department. Due to the hour MRI is not available but should be done in the morning. I do not suspect that this is a complication of his aneurysm repair but rather the trauma that ensued when he tried to leave the hospital. Labs pending including coagulation studies      Change of shift - care signed out to Dr. Fredricka Bonine and Kyung Bacca  Vida Roller, MD 01/05/12 [phone]

## 2012-01-05 NOTE — ED Notes (Signed)
Still no word from [facility] about bed assignment for pt. [facility] will not come for pt until bed is assigned.

## 2012-01-05 NOTE — ED Notes (Signed)
Pa in to see family and update them on plan of care

## 2012-01-05 NOTE — ED Notes (Signed)
Radiologist in to speak with family about findings on mri. Also to gain info about particular procedure pt had on his spine . Family verbalizes their concern if pt will have paralysis. Pa in and discussed plan of care with family. Pt is sleeping. Facial expression relaxed.

## 2012-01-05 NOTE — ED Notes (Signed)
Dr Doylene Canard in to speak with family. Neuro surgery paged to dr Doylene Canard.

## 2012-01-05 NOTE — ED Provider Notes (Signed)
12:15 PM The patient was seen and evaluated by me. He is at this time comfortable and reports no back pain. His MRI results show an epidural hematoma at approximately the L1-L2 level with spinal cord compression. The patient does have weakness in his lower extremities, although not severe. His left lower extremity is 4/5 strength in hip flexion and extension, knee flexion and extension, with 3+ out 5 strength at the left ankle and great toe dorsiflexion. The right lower extremity has 4/5 strength at hip flexion and extension, knee flexion and extension, ankle and great toe flexion and extension. The patient has intact light touch sensation bilaterally in the lower extremities but he reports that it is diminished. Her Dr. Rondel Baton prior note the patient has intact rectal tone and no perineal numbness. I have spoken with Dr. Lovell Sheehan of neurosurgery on call at [facility] and reviewed the case with him. He has reviewed the MRI images and has come to the emergency department to personally evaluate the patient. At this point, since the patient's problem seems to have arisen as a postoperative complication, Dr. Lovell Sheehan advises transfer and admission at [facility]. I have gotten in touch with Dr. Barron Alvine of neurosurgery at [facility] and discussed the case with him. He agrees to accept the patient for transfer and admission to [facility] under his service. The patient and his family were notified about this plan and they state their understanding of and agreement with that.  Felisa Bonier, MD 01/05/12 [phone]

## 2012-01-05 NOTE — ED Provider Notes (Signed)
Pt in CDU on back pain protocol.  MRI shows what appears to be a hematoma, likely intrathecal, w/ severe spinal stenosis, particularly at L2-L3.  INR 1.1 but patient was heparinized for vascular procedure performed 4 days ago.  The radiologist as well as myself and Dr. Fredricka Bonine have discussed results w/ patient's family.  Pt drowsy d/t pain medication administered to allow him to better tolerate MRI.  Dr. Fredricka Bonine consulted Dr. Lovell Sheehan (NS), he has seen patient in ED and recommends transfer to [facility].  Dr. Fredricka Bonine contacted Dr. Jodelle Gross, the neurosurgeon on call, and he has accepted patient's transfer.  Patient's family has been made aware.  Pt is currently stable and denies having pain at this time.  12:10 PM   Otilio Miu, PA 01/05/12 1800

## 2012-01-07 NOTE — ED Provider Notes (Signed)
Medical screening examination/treatment/procedure(s) were conducted as a shared visit with non-physician practitioner(s) and myself.  I personally evaluated the patient during the encounter  Please see my separate respective documentation pertaining to this patient encounter   Vida Roller, MD 01/07/12 [phone]

## 2012-01-09 ENCOUNTER — Ambulatory Visit: Payer: Medicare Other | Admitting: Pulmonary Disease

## 2012-01-11 ENCOUNTER — Encounter: Payer: Self-pay | Admitting: *Deleted

## 2012-01-11 ENCOUNTER — Inpatient Hospital Stay (HOSPITAL_COMMUNITY)
Admission: RE | Admit: 2012-01-11 | Discharge: 2012-01-30 | DRG: 945 | Disposition: A | Discharge status: Skilled Nursing Facility | Payer: Medicare Other | Source: Ambulatory Visit | Attending: Physical Medicine & Rehabilitation | Admitting: Physical Medicine & Rehabilitation

## 2012-01-11 ENCOUNTER — Other Ambulatory Visit: Payer: Self-pay | Admitting: Physician Assistant

## 2012-01-11 ENCOUNTER — Encounter (HOSPITAL_COMMUNITY): Payer: Self-pay

## 2012-01-11 ENCOUNTER — Ambulatory Visit: Payer: Medicare Other | Admitting: Pulmonary Disease

## 2012-01-11 DIAGNOSIS — Z7901 Long term (current) use of anticoagulants: Secondary | ICD-10-CM

## 2012-01-11 DIAGNOSIS — J4489 Other specified chronic obstructive pulmonary disease: Secondary | ICD-10-CM

## 2012-01-11 DIAGNOSIS — E7800 Pure hypercholesterolemia, unspecified: Secondary | ICD-10-CM

## 2012-01-11 DIAGNOSIS — I739 Peripheral vascular disease, unspecified: Secondary | ICD-10-CM

## 2012-01-11 DIAGNOSIS — E785 Hyperlipidemia, unspecified: Secondary | ICD-10-CM

## 2012-01-11 DIAGNOSIS — Z87891 Personal history of nicotine dependence: Secondary | ICD-10-CM

## 2012-01-11 DIAGNOSIS — R209 Unspecified disturbances of skin sensation: Secondary | ICD-10-CM

## 2012-01-11 DIAGNOSIS — M48061 Spinal stenosis, lumbar region without neurogenic claudication: Secondary | ICD-10-CM

## 2012-01-11 DIAGNOSIS — Z5189 Encounter for other specified aftercare: Principal | ICD-10-CM

## 2012-01-11 DIAGNOSIS — S064XAA Epidural hemorrhage with loss of consciousness status unknown, initial encounter: Secondary | ICD-10-CM

## 2012-01-11 DIAGNOSIS — G8220 Paraplegia, unspecified: Secondary | ICD-10-CM

## 2012-01-11 DIAGNOSIS — J449 Chronic obstructive pulmonary disease, unspecified: Secondary | ICD-10-CM

## 2012-01-11 DIAGNOSIS — Z79899 Other long term (current) drug therapy: Secondary | ICD-10-CM

## 2012-01-11 DIAGNOSIS — N390 Urinary tract infection, site not specified: Secondary | ICD-10-CM

## 2012-01-11 DIAGNOSIS — E871 Hypo-osmolality and hyponatremia: Secondary | ICD-10-CM

## 2012-01-11 DIAGNOSIS — I4891 Unspecified atrial fibrillation: Secondary | ICD-10-CM

## 2012-01-11 DIAGNOSIS — I10 Essential (primary) hypertension: Secondary | ICD-10-CM

## 2012-01-11 DIAGNOSIS — Z860100 Personal history of colon polyps, unspecified: Secondary | ICD-10-CM

## 2012-01-11 DIAGNOSIS — IMO0002 Reserved for concepts with insufficient information to code with codable children: Secondary | ICD-10-CM

## 2012-01-11 DIAGNOSIS — Y838 Other surgical procedures as the cause of abnormal reaction of the patient, or of later complication, without mention of misadventure at the time of the procedure: Secondary | ICD-10-CM

## 2012-01-11 DIAGNOSIS — R339 Retention of urine, unspecified: Secondary | ICD-10-CM

## 2012-01-11 DIAGNOSIS — Z7982 Long term (current) use of aspirin: Secondary | ICD-10-CM

## 2012-01-11 DIAGNOSIS — Z96659 Presence of unspecified artificial knee joint: Secondary | ICD-10-CM

## 2012-01-11 DIAGNOSIS — S064X9A Epidural hemorrhage with loss of consciousness of unspecified duration, initial encounter: Secondary | ICD-10-CM

## 2012-01-11 DIAGNOSIS — I509 Heart failure, unspecified: Secondary | ICD-10-CM

## 2012-01-11 DIAGNOSIS — B965 Pseudomonas (aeruginosa) (mallei) (pseudomallei) as the cause of diseases classified elsewhere: Secondary | ICD-10-CM

## 2012-01-11 LAB — MRSA PCR SCREENING: MRSA by PCR: NEGATIVE

## 2012-01-11 LAB — GLUCOSE, CAPILLARY: Glucose-Capillary: 139 mg/dL — ABNORMAL HIGH (ref 70–99)

## 2012-01-11 MED ORDER — HEPARIN SODIUM (PORCINE) 5000 UNIT/ML IJ SOLN
5000.0000 [IU] | Freq: Three times a day (TID) | INTRAMUSCULAR | Status: DC
  Administered 2012-01-11 – 2012-01-30 (×56): 5000 [IU] via SUBCUTANEOUS
  Filled 2012-01-11 (×59): qty 1

## 2012-01-11 MED ORDER — LOSARTAN POTASSIUM 50 MG PO TABS
100.0000 mg | ORAL_TABLET | Freq: Every day | ORAL | Status: DC
  Administered 2012-01-12 – 2012-01-30 (×16): 100 mg via ORAL
  Filled 2012-01-11 (×21): qty 2

## 2012-01-11 MED ORDER — TIOTROPIUM BROMIDE MONOHYDRATE 18 MCG IN CAPS
18.0000 ug | ORAL_CAPSULE | Freq: Every day | RESPIRATORY_TRACT | Status: DC
  Administered 2012-01-13 – 2012-01-30 (×15): 18 ug via RESPIRATORY_TRACT
  Filled 2012-01-11 (×4): qty 5

## 2012-01-11 MED ORDER — GUAIFENESIN ER 600 MG PO TB12
1200.0000 mg | ORAL_TABLET | Freq: Two times a day (BID) | ORAL | Status: DC
  Administered 2012-01-11 – 2012-01-30 (×38): 1200 mg via ORAL
  Filled 2012-01-11 (×41): qty 2

## 2012-01-11 MED ORDER — POLYETHYLENE GLYCOL 3350 17 G PO PACK
17.0000 g | PACK | Freq: Every day | ORAL | Status: DC | PRN
  Filled 2012-01-11: qty 1

## 2012-01-11 MED ORDER — ATENOLOL 50 MG PO TABS
50.0000 mg | ORAL_TABLET | Freq: Every day | ORAL | Status: DC
  Administered 2012-01-12 – 2012-01-30 (×17): 50 mg via ORAL
  Filled 2012-01-11 (×21): qty 1

## 2012-01-11 MED ORDER — ACETAMINOPHEN 325 MG PO TABS
325.0000 mg | ORAL_TABLET | ORAL | Status: DC | PRN
  Administered 2012-01-17 – 2012-01-21 (×6): 650 mg via ORAL
  Filled 2012-01-11 (×7): qty 2

## 2012-01-11 MED ORDER — TAMSULOSIN HCL 0.4 MG PO CAPS
0.4000 mg | ORAL_CAPSULE | Freq: Every day | ORAL | Status: DC
  Administered 2012-01-11 – 2012-01-27 (×16): 0.4 mg via ORAL
  Filled 2012-01-11 (×18): qty 1

## 2012-01-11 MED ORDER — OXYCODONE HCL 5 MG PO TABS
5.0000 mg | ORAL_TABLET | Freq: Four times a day (QID) | ORAL | Status: DC | PRN
  Administered 2012-01-12: 5 mg via ORAL
  Filled 2012-01-11: qty 1

## 2012-01-11 MED ORDER — ATORVASTATIN CALCIUM 10 MG PO TABS
10.0000 mg | ORAL_TABLET | Freq: Every day | ORAL | Status: DC
  Administered 2012-01-11 – 2012-01-29 (×18): 10 mg via ORAL
  Filled 2012-01-11 (×23): qty 1

## 2012-01-11 MED ORDER — SORBITOL 70 % SOLN
30.0000 mL | Freq: Every day | Status: DC | PRN
  Administered 2012-01-12: 30 mL via ORAL
  Filled 2012-01-11: qty 30

## 2012-01-11 MED ORDER — FLUTICASONE PROPIONATE 50 MCG/ACT NA SUSP
1.0000 | Freq: Two times a day (BID) | NASAL | Status: DC
  Administered 2012-01-12 – 2012-01-30 (×36): 1 via NASAL
  Filled 2012-01-11 (×3): qty 16

## 2012-01-11 MED ORDER — FUROSEMIDE 20 MG PO TABS
20.0000 mg | ORAL_TABLET | Freq: Two times a day (BID) | ORAL | Status: DC
  Administered 2012-01-11: 20 mg via ORAL
  Filled 2012-01-11 (×5): qty 1

## 2012-01-11 MED ORDER — SENNA 8.6 MG PO TABS
1.0000 | ORAL_TABLET | Freq: Every day | ORAL | Status: DC
  Administered 2012-01-12 – 2012-01-30 (×17): 8.6 mg via ORAL
  Filled 2012-01-11 (×23): qty 1

## 2012-01-11 MED ORDER — POTASSIUM CHLORIDE CRYS ER 10 MEQ PO TBCR
10.0000 meq | EXTENDED_RELEASE_TABLET | Freq: Every day | ORAL | Status: DC
  Administered 2012-01-12 – 2012-01-30 (×19): 10 meq via ORAL
  Filled 2012-01-11 (×21): qty 1

## 2012-01-11 MED ORDER — ADULT MULTIVITAMIN W/MINERALS CH
1.0000 | ORAL_TABLET | Freq: Every day | ORAL | Status: DC
  Administered 2012-01-12 – 2012-01-30 (×19): 1 via ORAL
  Filled 2012-01-11 (×21): qty 1

## 2012-01-11 NOTE — H&P (Incomplete)
Physical Medicine and Rehabilitation Admission H&P    No chief complaint on file. 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  He had a lumbar drain placed for 36 hours was discharged home in stable condition on 01/04/2012. He then presented 01/05/2012 from an outside hospital after being seen at [facility] emergency room with paresthesias, worsening back pain and lower extremity   weakness. MRI showed lumbar epidural hematoma. He was transferred to [facility] at that time per neurosurgery Dr. Newman Pies. He was taken to the operating room on 4/12 for multilevel laminectomy and epidural hematoma  evacuation by Dr. Alycia Patten. Patient completed Decadron protocol. Postoperative urinary retention with Foley catheter tube inserted 01/07/2012 and placed on Flomax. Bouts of hyponatremia 126 and monitored. For his history of atrial fibrillation he was continued on his home dose of atenolol however given his recent epidural hematoma and surgery his Coumadin was held. Per neurosurgery it will be safe to bridge Coumadin with Lovenox and restart aspirin one month from the date of his surgery.[name] He is presently maintained on subcutaneous heparin for deep vein thrombosis prophylaxis.  Review of Systems  Cardiovascular: Positive for palpitations and leg swelling.  Musculoskeletal: Positive for myalgias, back pain and joint pain.  All other systems reviewed and are negative.   Past Medical History  Diagnosis Date  . COPD (chronic obstructive pulmonary disease)   . Pneumonia, organism unspecified   . Pulmonary nodule   . HTN (hypertension)   . CHF (congestive heart failure)   . Atrial fibrillation   . Drug therapy   . PVC (premature ventricular contraction)    . PVD (peripheral vascular disease)   . Thoracic aortic aneurysm   . Hypercholesteremia   . Diverticulosis of colon   . Colon polyps   . Prostate cancer   . DJD (degenerative joint disease)   . Actinic skin damage   . Hyperlipidemia   . Internal hemorrhoids    Past Surgical History  Procedure Date  . Total knee arthroplasty     bilateral  . Abdominal aortic aneurysm repair 1998  . Appendectomy   . Inguinal hernia repair     left   Family History  Problem Relation Age of Onset  . Dementia Mother   . Stroke Father   . Diabetes Brother   . Heart disease Brother   . Heart disease Sister   . Heart disease Brother   . Obesity Brother   . Cancer Sister     back ??  . Alzheimer's disease Sister   . Colon cancer Neg Hx    Social History:  reports that he quit smoking about 23 years ago. His smoking use included Cigarettes. He has a 88 pack-year smoking history. He has never used smokeless tobacco. He reports that he does not drink alcohol or use illicit drugs. Allergies: No Known Allergies Medications Prior to Admission  Medication Sig Dispense Refill  . aspirin 81 MG tablet Take 81 mg by mouth daily.        [name] atenolol (TENORMIN) 50 MG tablet TAKE 1 TABLET ONCE DAILY  90 tablet  3  . atorvastatin (LIPITOR) 10 MG tablet TAKE 1 TABLET DAILY  90 tablet  3  . Fluticasone-Salmeterol (ADVAIR DISKUS) 250-50 MCG/DOSE AEPB Inhale 1 puff into the lungs every 12 (twelve)  hours.  180 each  3  . furosemide (LASIX) 20 MG tablet Take 1 tablet (20 mg total) by mouth 2 (two) times daily.  180 tablet  2  . guaiFENesin (MUCINEX) 600 MG 12 hr tablet Take 1,200 mg by mouth 2 (two) times daily as needed.        [name] losartan (COZAAR) 100 MG tablet TAKE 1 TABLET DAILY  90 tablet  3  . Multiple Vitamin (MULTIVITAMIN) tablet Take 1 tablet by mouth daily.        . potassium chloride (K-DUR,KLOR-CON) 10 MEQ tablet TAKE 1 TABLET DAILY  90 tablet  3  . SPIRIVA HANDIHALER 18 MCG inhalation capsule INHALE THE  CONTENTS OF 1 CAPSULE BY HANDIHALER DAILY  1 capsule  3  . warfarin (COUMADIN) 2.5 MG tablet Take 2.5 mg by mouth daily.      [name] DISCONTD: pantoprazole (PROTONIX) 40 MG tablet Take 1 tablet (40 mg total) by mouth daily.  30 tablet  5   No current facility-administered medications on file as of 01/11/2012.    Home:   Patient lives in [place]. He is married and is a caregiver for his wife who has poor health and limited mobility. Patient is retired. He was independent and active  prior to admission. They have a son who lives close who works but can take [employer] if needed. One level home with ramped entrance   Functional History:   independent prior to admission and retired in 1992. Patient does still drive  Functional Status:  Mobility: Minimal assist to contact-guard with rolling walker for ambulation short distances   needs min mod assist for sit to stand stand to sit        ADL: Minimal assistance for lower body activities of daily living    Cognition:   within functional limits     There were no vitals taken for this visit. '@PHYSEXAMBYAGE2'$ @  No results found for this or any previous visit (from the past 48 hour(s)). No results found.  Post Admission Physician Evaluation: 1. Functional deficits secondary  to ***. 2. Patient is admitted to receive collaborative, interdisciplinary care between the physiatrist, rehab nursing staff, and therapy team. 3. Patient's level of medical complexity and substantial therapy needs in context of that medical necessity cannot be provided at a lesser intensity of care such as a SNF. 4. Patient has experienced substantial functional loss from his/her baseline which was documented above under the "Functional History" and "Functional Status" headings.  Judging by the patient's diagnosis, physical exam, and functional history, the patient has potential for functional progress which will result in measurable gains while on inpatient rehab.  These gains  will be of substantial and practical use upon discharge  in facilitating mobility and self-care at the household level. 5. Physiatrist will provide 24 hour management of medical needs as well as oversight of the therapy plan/treatment and provide guidance as appropriate regarding the interaction of the two. 6. 24 hour rehab nursing will assist with {due WC:[id number]  and help integrate therapy concepts, techniques,education, etc. 7. PT will assess and treat for:  ***.  Goals are: ***. 8. OT will assess and treat for: ***.   Goals are: ***. 9. SLP will assess and treat for: ***.  Goals are: ***. 10. Case Management and Social Worker will assess and treat for psychological issues and discharge planning. 11. Team conference will be held weekly to assess progress toward goals and to determine barriers to discharge. 12.  Patient will receive  at least 3 hours of therapy per day at least 5 days per week. 13. ELOS and Prognosis: *** {potential:[id number]}   Medical Problem List and Plan: 1. lumbar epidural hematoma with paraparesis. 4 level laminectomy and evacuation of lumbar L2-3 epidural hematoma 01/05/2012 2. DVT Prophylaxis/Anticoagulation: Subcutaneous heparin. Monitor platelet counts and any signs of bleeding 3. Pain Management: Oxycodone 5 mg every 4-6 hours as needed 4. abdominal aortic aneurysm/thoraco abdominal aneurysm. Endovascularly repaired 01/01/2012. Will need followup at [facility] 5. Chronic atrial fibrillation. Cardiac rate control. Continue atenolol 50 mg daily. Coumadin and aspirin remain on hold for one month from date of surgery 01/05/2012 due to epidural hematoma 6. Urinary retention. Discontinue Foley catheter tube. Continue Flomax. Check PVRs x3 7. Congestive heart failure. Lasix 20 mg twice daily. Monitor for any signs of fluid overload 8. Hyponatremia. Latest sodium level 126 and followup labs in a.m. 9. COPD. Continue Spiriva and Flonase. 10. Hypertension. Continue losartan  100 mg daily. Monitor for any signs of orthostasis 11. Hyperlipidemia.atorvastatin   Sorayah Schrodt J. 01/11/2012, 11:25 AM

## 2012-01-11 NOTE — H&P (Signed)
Physical Medicine and Rehabilitation Admission H&P  No chief complaint on file.  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 He had a lumbar drain placed for 36 hours was discharged home in stable condition on 01/04/2012. He then presented 01/05/2012 from an outside hospital after being seen at [facility] emergency room with paresthesias, worsening back pain and lower extremity weakness. MRI showed lumbar epidural hematoma extending from L1-L3. It was severe compression of the L2 and L3 nerve roots due to the epidural hematoma. In addition there was moderately severe spinal stenosis at L3-4 and moderate spinal stenosis at L4-5.[name] He was transferred to [facility] at that time per neurosurgery Dr. Tressie Stalker. He was taken to the operating room on 4/12 for multilevel laminectomy and epidural hematoma evacuation by Dr. Damita Lack. Patient completed Decadron protocol. Postoperative urinary retention with Foley catheter tube inserted 01/07/2012 and placed on Flomax. Bouts of hyponatremia 126 and monitored. For his history of atrial fibrillation he was continued on his home dose of atenolol however given his recent epidural hematoma and surgery his Coumadin was held. Per neurosurgery it will be safe to bridge Coumadin with Lovenox and restart aspirin one month from the date of his surgery.[name] He is presently maintained on subcutaneous heparin for deep vein thrombosis prophylaxis.   Review of Systems  Cardiovascular: Positive for palpitations and leg swelling.  Musculoskeletal: Positive for myalgias, back pain and joint pain.  All other systems reviewed and are negative.   Past Medical History   Diagnosis  Date   .  COPD (chronic obstructive pulmonary disease)      .  Pneumonia, organism unspecified    .  Pulmonary nodule    .  HTN (hypertension)    .  CHF (congestive heart failure)    .  Atrial fibrillation    .  Drug therapy    .  PVC (premature ventricular contraction)    .  PVD (peripheral vascular disease)    .  Thoracic aortic aneurysm    .  Hypercholesteremia    .  Diverticulosis of colon    .  Colon polyps    .  Prostate cancer    .  DJD (degenerative joint disease)    .  Actinic skin damage    .  Hyperlipidemia    .  Internal hemorrhoids     Past Surgical History   Procedure  Date   .  Total knee arthroplasty      bilateral   .  Abdominal aortic aneurysm repair  1998   .  Appendectomy    .  Inguinal hernia repair      left    Family History   Problem  Relation  Age of Onset   .  Dementia  Mother    .  Stroke  Father    .  Diabetes  Brother    .  Heart disease  Brother    .  Heart disease  Sister    .  Heart disease  Brother    .  Obesity  Brother    .  Cancer  Sister       back ??    .  Alzheimer's disease  Sister    .  Colon cancer  Neg Hx  Social History: reports that he quit smoking about 23 years ago. His smoking use included Cigarettes. He has a 88 pack-year smoking history. He has never used smokeless tobacco. He reports that he does not drink alcohol or use illicit drugs.   Allergies: No Known Allergies   Medications Prior to Admission   Medication  Sig  Dispense  Refill   .  aspirin 81 MG tablet  Take 81 mg by mouth daily.     [name]  atenolol (TENORMIN) 50 MG tablet  TAKE 1 TABLET ONCE DAILY  90 tablet  3   .  atorvastatin (LIPITOR) 10 MG tablet  TAKE 1 TABLET DAILY  90 tablet  3   .  Fluticasone-Salmeterol (ADVAIR DISKUS) 250-50 MCG/DOSE AEPB  Inhale 1 puff into the lungs every 12 (twelve) hours.  180 each  3   .  furosemide (LASIX) 20 MG tablet  Take 1 tablet (20 mg total) by mouth 2 (two) times daily.  180 tablet  2   .  guaiFENesin (MUCINEX) 600 MG 12 hr tablet  Take 1,200 mg by mouth 2 (two) times  daily as needed.     [name]  losartan (COZAAR) 100 MG tablet  TAKE 1 TABLET DAILY  90 tablet  3   .  Multiple Vitamin (MULTIVITAMIN) tablet  Take 1 tablet by mouth daily.     .  potassium chloride (K-DUR,KLOR-CON) 10 MEQ tablet  TAKE 1 TABLET DAILY  90 tablet  3   .  SPIRIVA HANDIHALER 18 MCG inhalation capsule  INHALE THE CONTENTS OF 1 CAPSULE BY HANDIHALER DAILY  1 capsule  3   .  warfarin (COUMADIN) 2.5 MG tablet  Take 2.5 mg by mouth daily.     [name]  DISCONTD: pantoprazole (PROTONIX) 40 MG tablet  Take 1 tablet (40 mg total) by mouth daily.  30 tablet  5    No current facility-administered medications on file as of 01/11/2012.    Home:  Patient lives in [place]. He is married and is a caregiver for his wife who has poor health and limited mobility. Patient is retired. He was independent and active prior to admission. They have a son who lives close who works but can take [employer] if needed. One level home with ramped entrance  Functional History:  independent prior to admission and retired in 1992. Patient does still drive  Functional Status:  Mobility: Minimal assist to contact-guard with rolling walker for ambulation short distances  needs min mod assist for sit to stand stand to sit     ADL: Minimal assistance for lower body activities of daily living   Cognition:  within functional limits   112/70 P77 RR18 T 98.7 Physical Exam  Constitutional: He is oriented to person, place, and time. He appears well-developed.  HENT:  Head: Normocephalic.  Neck: Normal range of motion. Neck supple. No thyromegaly present.  Cardiovascular:       Controlled  Pulmonary/Chest: Breath sounds normal. He has no wheezes.  Abdominal: He exhibits no distension. There is no tenderness.  Musculoskeletal: He exhibits no edema.  Neurological: He is alert and oriented to person, place, and time.       Patient is hard of hearing and needs some subtle diffuse to hospital course  Skin:       Back incision clean  and dry with sutures  Psychiatric: He has a normal mood and affect.   motor strength is 4/5 in bilateral deltoid, biceps, triceps, grip Motor strength is 2  minus in the left hip flexor 3 in the right hip flexor 4 in bilateral quadriceps 3 in the left ankle dorsiflexor and 4 in the right ankle dorsiflexor. Sensation is intact to light touch in bilateral extremities tone without evidence of spasticity Mood and affect are appropriate although sometimes he is slow to respond. He does have hearing impairment  Post Admission Physician Evaluation:  1. Functional deficits secondary to lumbar epidural hematoma primarily causing compression of the L2 and L3 nerve roots. In addition he may have some neurogenic bladder however the conus medullaris looks intact on the MRI.. 2. Patient is admitted to receive collaborative, interdisciplinary care between the physiatrist, rehab nursing staff, and therapy team. 3. Patient's level of medical complexity and substantial therapy needs in context of that medical necessity cannot be provided at a lesser intensity of care such as a SNF. 4. Patient has experienced substantial functional loss from his/her baseline which was documented above under the "Functional History" and "Functional Status" headings. Judging by the patient's diagnosis, physical exam, and functional history, the patient has potential for functional progress which will result in measurable gains while on inpatient rehab. These gains will be of substantial and practical use upon discharge in facilitating mobility and self-care at the household level. 5. Physiatrist will provide 24 hour management of medical needs as well as oversight of the therapy plan/treatment and provide guidance as appropriate regarding the interaction of the two. 6. 24 hour rehab nursing will assist with bladder management, bowel management, safety, skin/wound care, disease management, medication administration, pain management and patient  education and help integrate therapy concepts, techniques,education, etc. 7. PT will assess and treat for: Pre-gait training gait training endurance safety equipment. Goals are: Supervision for mobility. 8. OT will assess and treat for: ADLs, equipment, safety, endurance. Goals are: Modified independent with ADLs. 9. SLP will assess and treat for: Not applicable. Goals are: Not applicable. 10. Case Management and Social Worker will assess and treat for psychological issues and discharge planning. 11. Team conference will be held weekly to assess progress toward goals and to determine barriers to discharge. 12. Patient will receive at least 3 hours of therapy per day at least 5 days per week. 13. ELOS and Prognosis: Ten-days excellent Medical Problem List and Plan:  1. lumbar epidural hematoma with paraparesis. 4 level laminectomy and evacuation of lumbar L2-3 epidural hematoma 01/05/2012  2. DVT Prophylaxis/Anticoagulation: Subcutaneous heparin. Monitor platelet counts and any signs of bleeding  3. Pain Management: Oxycodone 5 mg every 4-6 hours as needed  4. abdominal aortic aneurysm/thoraco abdominal aneurysm. Endovascularly repaired 01/01/2012. Will need followup at [facility]  5. Chronic atrial fibrillation. Cardiac rate control. Continue atenolol 50 mg daily. Coumadin and aspirin remain on hold for one month from date of surgery 01/05/2012 due to epidural hematoma  6. Urinary retention. Discontinue Foley catheter tube. Continue Flomax. Check PVRs x3  7. Congestive heart failure. Lasix 20 mg twice daily. Monitor for any signs of fluid overload  8. Hyponatremia. Latest sodium level 126 and followup labs in a.m.  9. COPD. Continue Spiriva and Flonase.  10. Hypertension. Continue losartan 100 mg daily. Monitor for any signs of orthostasis  11. Hyperlipidemia.atorvastatin  ANGIULLI,DANIEL J.  01/11/2012, 11:25 AM

## 2012-01-11 NOTE — Plan of Care (Signed)
Overall Plan of Care [facility]) Patient Details Name: Ryan Hess MRN: [id number] DOB: Jul 22, 1924  Diagnosis:   Rehabilitation for paraplegia Primary Diagnosis:    Epidural hematoma Co-morbidities:  COPD (chronic obstructive pulmonary disease)  .  Pneumonia, organism unspecified  .  Pulmonary nodule  .  HTN (hypertension)  .  CHF (congestive heart failure)  .  Atrial fibrillation  .  Drug therapy   Functional Problem List  Patient demonstrates impairments in the following areas: Balance, Endurance, Pain and Safety  Basic ADL's: grooming, bathing, dressing and toileting Advanced ADL's: simple meal preparation  Transfers:  bed mobility, bed to chair, toilet, tub/shower, car and furniture Locomotion:  ambulation, wheelchair mobility and stairs  Additional Impairments:  Other  Anticipated Outcomes Item Anticipated Outcome  Eating/Swallowing    Basic self-care  Supervision  Tolieting  Supervision  Bowel/Bladder  Mod I   Transfers  Supervision; min A car  Locomotion  Supervision gait, min A stairs  Communication    Cognition    Pain  <3  Safety/Judgment    Other     Therapy Plan: PT Frequency: 1-2 X/day, 60-90 minutes OT Frequency: 1-2 X/day, 60-90 minutes     Team Interventions: Item RN PT OT SLP SW TR Other  Self Care/Advanced ADL Retraining  x x      Neuromuscular Re-Education  x x      Therapeutic Activities  x x      UE/LE Strength Training/ROM  x x      UE/LE Coordination Activities  x x      Visual/Perceptual Remediation/Compensation         DME/Adaptive Equipment Instruction  x x      Therapeutic Exercise  x x      Balance/Vestibular Training  x x      Patient/Family Education  x x      Cognitive Remediation/Compensation   x      Functional Mobility Training  x x      Ambulation/Gait Training  x x      Stair Training  x       Wheelchair Propulsion/Positioning  x x      Functional Statistician  x       Community  Reintegration  x x      Dysphagia/Aspiration Film/video editor         Bladder Management x        Bowel Management x        Disease Management/Prevention x        Pain Management x x x      Medication Management x        Skin Care/Wound Management x x x      Splinting/Orthotics  x x      Discharge Planning x x x  x    Psychosocial Support x x x  x                       Team Discharge Planning: Destination:  Home Projected Follow-up:  PT and Home Health and additional occupational therapy is TBD Projected Equipment Needs:  Dan Humphreys, BSC? And tub transfer bench? Patient/family involved in discharge planning:  Yes  MD ELOS: 2-3 weeks Medical Rehab Prognosis:  Good Assessment: 78 year-old male who developed epidural hematoma related to epidural catheter now with paraplegia requiring CIR level PT, OT, 24 7 rehabilitation nursing and M.D.

## 2012-01-11 NOTE — PMR Pre-admission (Signed)
[employer] PMR Admission Coordinator Pre-Admission Assessment  Patient: Ryan Hess is an 78 y.o., male MRN: [id number] DOB: Jan 07, 1924 Height: 5\' 9"  (175.3 cm) Weight: 83.915 kg (185 lb)  Emergency Contact Information Contact Information    Name Relation Home Work Mobile   [place] Spouse [phone]     Dimitris, Shanahan   [phone]     Insurance Information: Approved by [facility] [employer]     PRIMARY:Blue MCR      Policy#:[id number]      Subscriber:patient CM Name:Shannon Thompson-Lamott      Phone#:[phone]     NUU#:[phone] Pre-Cert#:will be given after patient is admitted     Employer:Retired Benefits:  Phone #[phone]     Name:Tracey Eff. Date:09/26/11     Deduct:0      Out of Pocket Max:$3400/met $360.68      Life FIE:PPIRJ CIR:$170/day 1-6; $0/day 7 and beyond      SNF:$0/day 1-10; $50/day 11-100; full charges after 100 days Outpatient:no visit limit     Co-Pay:$35/visit Home Health:100%      Co-Pay:0 DME:80%     Co-Pay:20% Providers:in network   Current Medical History  Patient Admitting Diagnosis: S/P multi level laminectomy for epidural hematoma evacuation after AAA repair History of Present Illness: Patient was admitted to [facility] for endovascular AAA repair in which he was discharged home in care of family on 01/04/12. Patient and son state that patient developed severe lower back pain after coming home with a controlled fall at home due to lower extremity weakness. Patient then taken by family to [facility] ED for increased pain and weakness in which an MRI was done and noted that patient had an epidural hematoma around the L2 level with worsening paraesthesia. Patient was then transferred back to [facility] for emergent multi level laminectomy for epidural hematoma evacuation at the L2-L3 level. Patient tolerated surgery with JP drain and removal on 01/09/12. Patient was having some increased urinary retention after foley d/c'd and foley then restarted.  Patient continues with lower extremity weakness, but progressing with therapies.     Past Medical History  Past Medical History  Diagnosis Date  . COPD (chronic obstructive pulmonary disease)   . Pneumonia, organism unspecified   . Pulmonary nodule   . HTN (hypertension)   . CHF (congestive heart failure)   . Atrial fibrillation   . Drug therapy   . PVC (premature ventricular contraction)   . PVD (peripheral vascular disease)   . Thoracic aortic aneurysm   . Hypercholesteremia   . Diverticulosis of colon   . Colon polyps   . Prostate cancer   . DJD (degenerative joint disease)   . Actinic skin damage   . Hyperlipidemia   . Internal hemorrhoids     Family History  family history includes Alzheimer's disease in his sister; Cancer in his sister; Dementia in his mother; Diabetes in his brother; Heart disease in his brothers and sister; Obesity in his brother; and Stroke in his father.  There is no history of Colon cancer.  Prior Rehab/Hospitalizations: No prior CIR stays   Current Medications See H & P  Patients Current Diet:  Heart Healthy  Precautions / Restrictions Precautions Precautions: Fall;Other (comment) (lumbar lami on 01/05/12, no written back precautions) Precautions/Special Needs: Other Precaution Comments: foley for urinary retention; JP drain currently in place with minimal output Restrictions Weight Bearing Restrictions: No   Prior Activity Level Community (5-7x/wk): active in [employer] / Equipment Home Assistive Devices/Equipment: Bedside commode/3-in-1;Raised toilet seat with  rails;Walker (specify type)   Prior Functional Level Current Functional Level  Bed Mobility  Independent  Min assist   Transfers  Independent  Min assist   Mobility - Walk/Wheelchair  Independent  Min assist (RW approx. 20 ft)   Upper Body Dressing  Independent  Min assist   Lower Body Dressing  Independent  Max assist   Grooming   Independent   (set up)   Eating/Drinking  Independent   (set up)   Toilet Transfer  Independent  Min assist   Bladder Continence   Continent  foley for urinary retention   Bowel Management  WNL  good rectal tone per nursing, unsure of latest BM   Stair Climbing  Able to Take Stairs?: Yes   (not attempted)   Communication  WNL  WNL   Memory  WNL  WNL   Cooking/Meal Prep  light      Housework  light    Money Management  Independent    Driving  Yes     Previous Home Environment Living Arrangements: Spouse/significant other Lives With: Spouse Type of Home: House Home Layout: One level Home Access: Stairs to enter [employer] of Steps: 4-5 steps Bathroom Shower/Tub: Engineer, manufacturing systems: Handicapped height Bathroom Accessibility: Yes How Accessible: Accessible via walker Home Care Services: No  Discharge Living Setting Plans for Discharge Living Setting: Patient's home;House;Lives with (comment) (wife and son lives in apartment in back yard) Type of Home at Discharge: House Discharge Home Layout: One level Discharge Home Access: Stairs to enter Entrance Stairs-Number of Steps: 4-5 Discharge Bathroom Shower/Tub: Tub/shower unit Discharge Bathroom Toilet: Handicapped height Discharge Bathroom Accessibility: Yes How Accessible: Accessible via walker Do you have any problems obtaining your medications?: No  Social/Family/Support Systems Patient Roles: Spouse;Parent Contact Information: home [phone] Anticipated Caregiver: son- Teoman Giraud Anticipated Caregiver's Contact Information: [phone] Ability/Limitations of Caregiver: works but is planning on assisting as needed Caregiver Availability: Other (Comment) (in the beginning with medical leave of absence from work) Discharge Plan Discussed with Primary Caregiver: Yes Is Caregiver In Agreement with Plan?: Yes Does Caregiver/Family have Issues with  Lodging/Transportation while Pt is in Rehab?: No  Goals/Additional Needs Patient/Family Goal for Rehab: Mod I with PT/ Mod I with OT Expected length of stay: approx. 14 days Cultural Considerations: none Dietary Needs: Heart Healthy Equipment Needs: to be determined Pt/Family Agrees to Admission and willing to participate: Yes Program Orientation Provided & Reviewed with Pt/Caregiver Including Roles  & Responsibilities: Yes  Patient Condition: This patient was evaluated by the admission coordinator on 12/1711 and discussed with the Rehab MD, in which the Rehabilitation Physician determined and documented that the patient's condition is appropriate for intensive rehabilitative care in an inpatient rehabilitation facility.  Preadmission Screen Completed By:  Oletta Darter, 01/11/2012 11:41 AM ______________________________________________________________________   Discussed status with Dr. Jodean Lima on 01/11/12 at 0800 and received telephone approval for admission today.  Admission Coordinator:  Oletta Darter, time 12:25pm /Date4/18/13

## 2012-01-12 LAB — DIFFERENTIAL
Basophils Absolute: 0 10*3/uL (ref 0.0–0.1)
Basophils Relative: 0 % (ref 0–1)
Eosinophils Absolute: 0.1 10*3/uL (ref 0.0–0.7)
Eosinophils Relative: 1 % (ref 0–5)
Lymphocytes Relative: 9 % — ABNORMAL LOW (ref 12–46)
Lymphs Abs: 1.1 10*3/uL (ref 0.7–4.0)
Monocytes Absolute: 0.8 10*3/uL (ref 0.1–1.0)
Monocytes Relative: 7 % (ref 3–12)
Neutro Abs: 10.1 10*3/uL — ABNORMAL HIGH (ref 1.7–7.7)
Neutrophils Relative %: 83 % — ABNORMAL HIGH (ref 43–77)

## 2012-01-12 LAB — COMPREHENSIVE METABOLIC PANEL
ALT: 34 U/L (ref 0–53)
AST: 45 U/L — ABNORMAL HIGH (ref 0–37)
Albumin: 2.4 g/dL — ABNORMAL LOW (ref 3.5–5.2)
Alkaline Phosphatase: 172 U/L — ABNORMAL HIGH (ref 39–117)
BUN: 19 mg/dL (ref 6–23)
CO2: 25 mEq/L (ref 19–32)
Calcium: 8.1 mg/dL — ABNORMAL LOW (ref 8.4–10.5)
Chloride: 91 mEq/L — ABNORMAL LOW (ref 96–112)
Creatinine, Ser: 0.85 mg/dL (ref 0.50–1.35)
GFR calc Af Amer: 89 mL/min — ABNORMAL LOW (ref >90)
GFR calc non Af Amer: 77 mL/min — ABNORMAL LOW (ref >90)
Glucose, Bld: 100 mg/dL — ABNORMAL HIGH (ref 70–99)
Potassium: 3.9 mEq/L (ref 3.5–5.1)
Sodium: 125 mEq/L — ABNORMAL LOW (ref 135–145)
Total Bilirubin: 2.5 mg/dL — ABNORMAL HIGH (ref 0.3–1.2)
Total Protein: 5.3 g/dL — ABNORMAL LOW (ref 6.0–8.3)

## 2012-01-12 LAB — GLUCOSE, CAPILLARY: Glucose-Capillary: 104 mg/dL — ABNORMAL HIGH (ref 70–99)

## 2012-01-12 LAB — CBC
HCT: 36.6 % — ABNORMAL LOW (ref 39.0–52.0)
Hemoglobin: 12.7 g/dL — ABNORMAL LOW (ref 13.0–17.0)
MCH: 31.1 pg (ref 26.0–34.0)
MCHC: 34.7 g/dL (ref 30.0–36.0)
MCV: 89.5 fL (ref 78.0–100.0)
Platelets: 250 10*3/uL (ref 150–400)
RBC: 4.09 MIL/uL — ABNORMAL LOW (ref 4.22–5.81)
RDW: 14.7 % (ref 11.5–15.5)
WBC: 12.2 10*3/uL — ABNORMAL HIGH (ref 4.0–10.5)

## 2012-01-12 NOTE — Progress Notes (Signed)
Patient information reviewed and entered into UDS-PRO system by Brandelyn Henne, RN, CRRN, PPS Coordinator.  Information including medical coding and functional independence measure will be reviewed and updated through discharge.     Per nursing patient was given "Data Collection Information Summary for Patients in Inpatient Rehabilitation Facilities with attached "Privacy Act Statement-Health Care Records" upon admission.   

## 2012-01-12 NOTE — Evaluation (Signed)
Occupational Therapy Assessment and Plan and Session Notes  Patient Details  Name: Ryan Hess MRN: [id number] Date of Birth: 06-Jul-1924  OT Diagnosis: acute pain and muscle weakness (generalized) Rehab Potential: Rehab Potential: Good ELOS: 10-12 days  Today's Date: 01/12/2012  Problem List:  Patient Active Problem List  Diagnoses  . PROSTATE CANCER  . COLONIC POLYPS  . HYPERCHOLESTEROLEMIA  . HYPERTENSION  . ATRIAL FIBRILLATION  . PREMATURE VENTRICULAR CONTRACTIONS, FREQUENT  . CONGESTIVE HEART FAILURE  . THORACIC AORTIC ANEURYSM  . PERIPHERAL VASCULAR DISEASE  . PNEUMONIA, ORGANISM UNSPECIFIED  . COPD  . PULMONARY NODULE  . DIVERTICULOSIS OF COLON  . ACTINIC SKIN DAMAGE  . DEGENERATIVE JOINT DISEASE  . WEAKNESS  . DYSPNEA  . OTHER DYSPHAGIA  . Epidural hematoma    Past Medical History:  Past Medical History  Diagnosis Date  . COPD (chronic obstructive pulmonary disease)   . Pneumonia, organism unspecified   . Pulmonary nodule   . HTN (hypertension)   . CHF (congestive heart failure)   . Atrial fibrillation   . Drug therapy   . PVC (premature ventricular contraction)   . PVD (peripheral vascular disease)   . Thoracic aortic aneurysm   . Hypercholesteremia   . Diverticulosis of colon   . Colon polyps   . Prostate cancer   . DJD (degenerative joint disease)   . Actinic skin damage   . Hyperlipidemia   . Internal hemorrhoids    Past Surgical History:  Past Surgical History  Procedure Date  . Total knee arthroplasty     bilateral  . Abdominal aortic aneurysm repair 1998  . Appendectomy   . Inguinal hernia repair     left  . Joint replacement     ASSESSMENT AND PLAN  Clinical Impression: 78 year old right-handed white male with past medical history atrial fibrillation with chronic Coumadin therapy and abdominal aortic aneurysm repair 1998 and progressive thoraco abdominal aneurysm to 5.9 cm which was electively repaired at [facility]  endovascularly on 01/01/2012. He had a lumbar drain placed for 36 hours was discharged home in stable condition on 01/04/2012. He then presented 01/05/2012 from an outside hospital after being seen at [facility] emergency room with paresthesias, worsening back pain and lower extremity weakness. MRI showed lumbar epidural hematoma. He was transferred to [facility] at that time per neurosurgery Dr. Newman Pies. He was taken to the operating room on 4/12 for multilevel laminectomy and epidural hematoma evacuation by Dr. Alycia Patten. Patient completed Decadron protocol. Postoperative urinary retention with Foley catheter tube inserted 01/07/2012 and placed on Flomax. Bouts of hyponatremia 126 and monitored. For his history of atrial fibrillation he was continued on his home dose of atenolol however given his recent epidural hematoma and surgery his Coumadin was held. Per neurosurgery it will be safe to bridge Coumadin with Lovenox and restart aspirin one month from the date of his surgery.[name] He is presently maintained on subcutaneous heparin for deep vein thrombosis prophylaxis.Patient transferred to CIR on 01/11/2012 .    Patient currently requires min to total assist with basic self-care skills and IADL secondary to muscle weakness and decreased sitting balance, decreased standing balance, decreased postural control, decreased balance strategies and difficulty maintaining precautions.  Prior to hospitalization, patient was assisting with his wifes care, driving, and independent with ADLs and IADLs.  Patient will benefit from skilled intervention to increase independence with basic self-care skills prior to discharge home with wife, who currently needs min assist with BADLs and IADLs  AND son and daughter who seem to be supportive in his care.  Anticipate patient will require 24 hour supervision and additional occupatoional therapy is to be determined.  OT - End of Session Activity Tolerance: Tolerates 30+  min activity with multiple rests OT Assessment Rehab Potential: Good Barriers to Discharge: Decreased caregiver support Barriers to Discharge Comments: Patient states PTA he was assisting with wifes care.  OT Plan OT Frequency: 1-2 X/day, 60-90 minutes Estimated Length of Stay: 10-12 OT Treatment/Interventions: Balance/vestibular training;Cognitive remediation/compensation;Community reintegration;Discharge planning;Disease mangement/prevention;DME/adaptive equipment instruction;Neuromuscular re-education;Pain management;Patient/family education;Psychosocial support;Self Care/advanced ADL retraining;Skin care/wound managment;Splinting/orthotics;Therapeutic Activities;Therapeutic Exercise;UE/LE Strength taining/ROM;UE/LE Coordination activities;Wheelchair propulsion/positioning OT Recommendation Follow Up Recommendations:  (OT: TBD) Equipment Recommended: Rolling walker with 5" wheels (w/c TBD)  OT Evaluation Precautions/Restrictions  Precautions Precautions: Fall;Other (comment) (no written orders regarding back precautions) Restrictions Weight Bearing Restrictions: No  General Chart Reviewed: Yes  Pain Pain Assessment Pain Assessment: No/denies pain Pain Score: 0-No pain  Home Living/Prior Functioning Home Living Lives With: Spouse Available Help at Discharge: Family (son can take FMLA but not longterm; wife requires Assist) Type of Home: House Home Access: Stairs to enter Technical brewer of Steps: 4-5 Entrance Stairs-Rails: None (wide platform stairs where a walker will fit) Home Layout: One level Bathroom Shower/Tub: Chiropodist: Handicapped height Bathroom Accessibility: Yes How Accessible: Accessible via walker IADL History Homemaking Responsibilities: Yes Meal Prep Responsibility: Primary Laundry Responsibility: Primary Cleaning Responsibility: Primary Prior Function Level of Independence: Independent with basic ADLs;Independent with  gait;Independent with transfers Able to Take Stairs?: Yes Driving: Yes Vocation: Retired  ADL - See FIM  Vision/Perception  Vision - History Baseline Vision: Wears glasses all the time Patient Visual Report: No change from baseline Perception Perception: Within Functional Limits Praxis Praxis: Intact   Cognition Overall Cognitive Status: Impaired (due to lethargy; son believes related to anesthesia/meds) Arousal/Alertness: Lethargic Orientation Level: Oriented to place;Oriented to person;Oriented to situation Safety/Judgment: Impaired  Sensation Sensation Light Touch: Appears Intact (reports "a little off" in LEs) Stereognosis: Not tested Hot/Cold: Not tested Proprioception: Appears Intact Coordination Gross Motor Movements are Fluid and Coordinated: Yes Fine Motor Movements are Fluid and Coordinated: Yes  Trunk/Postural Assessment  Cervical Assessment Cervical Assessment:  (forward head; [facility]) Thoracic Assessment Thoracic Assessment:  (flexed posture) Lumbar Assessment Lumbar Assessment:  (not tested due to unclear if has back precautions) Postural Control Postural Control: Deficits on evaluation (decreased balance strategies; flexed posture with gait/stand)   Balance Balance Balance Assessed: Yes Static Sitting Balance Static Sitting - Level of Assistance: 4: Min assist Dynamic Sitting Balance Dynamic Sitting - Level of Assistance: 3: Mod assist (LOB posterior, unable to correct without ModA) Static Standing Balance Static Standing - Level of Assistance: 4: Min assist (with bilat UE support) Dynamic Standing Balance Dynamic Standing - Level of Assistance: 3: Mod assist  Extremity/Trunk Assessment RUE Assessment RUE Assessment: Within Functional Limits LUE Assessment LUE Assessment: Within Functional Limits  See FIM for current functional status  Refer to Care Plan for Long Term Goals  Recommendations for other services: None  Discharge Criteria:  Patient will be discharged from OT if patient refuses treatment 3 consecutive times without medical reason, if treatment goals not met, if there is a change in medical status, if patient makes no progress towards goals or if patient is discharged from hospital.  The above assessment, treatment plan, treatment alternatives and goals were discussed and mutually agreed upon: by patient  SESSION NOTES  Session #1 [id number] - 55 Minutes Individual Therapy No complaints  of pain 1:1 OT evaluation completed. Focused skilled intervention on bed mobility, UB/LB bathing and dressing in sit-> stand position from edge of bed using rolling walker, sit/stands using rolling walker, overall activity tolerance/endurance, edge of bed -> w/c stand pivot transfer using rolling walker, and grooming tasks seated at sink in w/c. Patient with more than reasonable amount of time to complete tasks and lethargic throughout session. See FIM for more information regarding assist level for ADLs.   Session #2 [id number] - 18 Minutes Individual Therapy No complaints of pain Upon entering room patient seated in recliner. Patient's son present in room. Patient continued to show signs of being lethargic during session, however patient still willing to participate. Engaged in functional ambulation using rolling walker throughout room for toilet transfer onto elevated toilet seat and toileting (clothing management and peri hygiene). Therapist then propelled patient -> tub room for simulated tub/shower transfer onto tub transfer bench with mod assist in/out tub.   Education provided to both son and patient, see Patient Education for more information.    Jazz Rogala 01/12/2012, 12:04 PM

## 2012-01-12 NOTE — Progress Notes (Signed)
Patient ID: Ryan Hess, male   DOB: [birth year], 78 y.o.   MRN: [id number]  Subjective/Complaints: 78 year old right-handed white male with past medical history atrial fibrillation with chronic Coumadin therapy and abdominal aortic aneurysm repair 1998 and progressive thoraco abdominal aneurysm to 5.9 cm which was electively repaired at [facility] endovascularly on 01/01/2012. He had a lumbar drain placed for 36 hours was discharged home in stable condition on 01/04/2012. He then presented 01/05/2012 from an outside hospital after being seen at [facility] emergency room with paresthesias, worsening back pain and lower extremity weakness. MRI showed lumbar epidural hematoma extending from L1-L3. It was severe compression of the L2 and L3 nerve roots due to the epidural hematoma. In addition there was moderately severe spinal stenosis at L3-4 and moderate spinal stenosis at L4-5.[name] He was transferred to [facility] at that time per neurosurgery Dr. Newman Pies. He was taken to the operating room on 4/12 for multilevel laminectomy and epidural hematoma evacuation by Dr. Alycia Patten Somnolent this am bur without c/os Review of Systems  Unable to perform ROS: mental acuity    Objective: Vital Signs: Blood pressure 128/87, pulse 101, temperature 98.7 F (37.1 C), temperature source Axillary, resp. rate 20, height '5\' 9"'$  (1.753 m), weight 85.367 kg (188 lb 3.2 oz), SpO2 94.00%. No results found. Results for orders placed during the hospital encounter of 01/11/12 (from the past 72 hour(s))  MRSA PCR SCREENING     Status: Normal   Collection Time   01/11/12  8:49 PM      Component Value Range Comment   MRSA by PCR NEGATIVE  NEGATIVE    GLUCOSE, CAPILLARY     Status: Abnormal   Collection Time   01/11/12  9:14 PM      Component Value Range Comment   Glucose-Capillary 139 (*) 70 - 99 (mg/dL)    Comment 1 Notify RN     CBC     Status: Abnormal   Collection Time   01/12/12  5:00 AM   Component Value Range Comment   WBC 12.2 (*) 4.0 - 10.5 (K/uL)    RBC 4.09 (*) 4.22 - 5.81 (MIL/uL)    Hemoglobin 12.7 (*) 13.0 - 17.0 (g/dL)    HCT 36.6 (*) 39.0 - 52.0 (%)    MCV 89.5  78.0 - 100.0 (fL)    MCH 31.1  26.0 - 34.0 (pg)    MCHC 34.7  30.0 - 36.0 (g/dL)    RDW 14.7  11.5 - 15.5 (%)    Platelets 250  150 - 400 (K/uL)   COMPREHENSIVE METABOLIC PANEL     Status: Abnormal   Collection Time   01/12/12  5:00 AM      Component Value Range Comment   Sodium 125 (*) 135 - 145 (mEq/L)    Potassium 3.9  3.5 - 5.1 (mEq/L)    Chloride 91 (*) 96 - 112 (mEq/L)    CO2 25  19 - 32 (mEq/L)    Glucose, Bld 100 (*) 70 - 99 (mg/dL)    BUN 19  6 - 23 (mg/dL)    Creatinine, Ser 0.85  0.50 - 1.35 (mg/dL)    Calcium 8.1 (*) 8.4 - 10.5 (mg/dL)    Total Protein 5.3 (*) 6.0 - 8.3 (g/dL)    Albumin 2.4 (*) 3.5 - 5.2 (g/dL)    AST 45 (*) 0 - 37 (U/L)    ALT 34  0 - 53 (U/L)    Alkaline Phosphatase 172 (*)  39 - 117 (U/L)    Total Bilirubin 2.5 (*) 0.3 - 1.2 (mg/dL)    GFR calc non Af Amer 77 (*) >90 (mL/min)    GFR calc Af Amer 89 (*) >90 (mL/min)   DIFFERENTIAL     Status: Abnormal   Collection Time   01/12/12  5:00 AM      Component Value Range Comment   Neutrophils Relative 83 (*) 43 - 77 (%)    Neutro Abs 10.1 (*) 1.7 - 7.7 (K/uL)    Lymphocytes Relative 9 (*) 12 - 46 (%)    Lymphs Abs 1.1  0.7 - 4.0 (K/uL)    Monocytes Relative 7  3 - 12 (%)    Monocytes Absolute 0.8  0.1 - 1.0 (K/uL)    Eosinophils Relative 1  0 - 5 (%)    Eosinophils Absolute 0.1  0.0 - 0.7 (K/uL)    Basophils Relative 0  0 - 1 (%)    Basophils Absolute 0.0  0.0 - 0.1 (K/uL)         Assessment/Plan: 1. Functional deficits secondary to Paraparesis due to epidural hematoma L2,L3 compression which require 3+ hours per day of interdisciplinary therapy in a comprehensive inpatient rehab setting. Physiatrist is providing close team supervision and 24 hour management of active medical problems listed  below. Physiatrist and rehab team continue to assess barriers to discharge/monitor patient progress toward functional and medical goals. FIM:                   Comprehension Comprehension Mode: Auditory Comprehension: 5-Follows basic conversation/direction: With extra time/assistive device  Expression Expression Mode: Verbal Expression: 5-Expresses basic 90% of the time/requires cueing < 10% of the time.  Social Interaction Social Interaction: 4-Interacts appropriately 75 - 89% of the time - Needs redirection for appropriate language or to initiate interaction.  Problem Solving Problem Solving: 5-Solves basic 90% of the time/requires cueing < 10% of the time  Memory Memory: 3-Recognizes or recalls 50 - 74% of the time/requires cueing 25 - 49% of the time  2. Anticoagulation/DVT prophylaxis with Pharmaceutical: Heparin  Medical Problem List and Plan:  1. lumbar epidural hematoma with paraparesis. 4 level laminectomy and evacuation of lumbar L2-3 epidural hematoma 01/05/2012  2. DVT Prophylaxis/Anticoagulation: Subcutaneous heparin. Monitor platelet counts and any signs of bleeding  3. Pain Management: Oxycodone 5 mg every 4-6 hours as needed  4. abdominal aortic aneurysm/thoraco abdominal aneurysm. Endovascularly repaired 01/01/2012. Will need followup at [facility]  5. Chronic atrial fibrillation. Cardiac rate control. Continue atenolol 50 mg daily. Coumadin and aspirin remain on hold for one month from date of surgery 01/05/2012 due to epidural hematoma  6. Urinary retention. Discontinue Foley catheter tube. Continue Flomax. Check PVRs x3  7. Congestive heart failure. Lasix 20 mg twice daily. Monitor for any signs of fluid overload  8. Hyponatremia. Latest sodium level 125 hold lasix. Recheck BMET in 3-4 days 9. COPD. Continue Spiriva and Flonase.  10. Hypertension. Continue losartan 100 mg daily. Monitor for any signs of orthostasis  11.  Somnolence suspect pain meds  monitor 11. Hyperlipidemia.atorvastatin    LOS (Days) 1 A FACE TO FACE EVALUATION WAS PERFORMED  Cian Costanzo E 01/12/2012, 7:08 AM

## 2012-01-12 NOTE — Evaluation (Signed)
Physical Therapy Assessment and Plan  Patient Details  Name: Ryan Hess MRN: [id number] Date of Birth: [birth year]  PT Diagnosis: Difficulty walking, Low back pain and Muscle weakness Rehab Potential: Good ELOS: 10-12 days   Today's Date: 01/12/2012 Time:1030-1130 60 minutes    Problem List:  Patient Active Problem List  Diagnoses  . PROSTATE CANCER  . COLONIC POLYPS  . HYPERCHOLESTEROLEMIA  . HYPERTENSION  . ATRIAL FIBRILLATION  . PREMATURE VENTRICULAR CONTRACTIONS, FREQUENT  . CONGESTIVE HEART FAILURE  . THORACIC AORTIC ANEURYSM  . PERIPHERAL VASCULAR DISEASE  . PNEUMONIA, ORGANISM UNSPECIFIED  . COPD  . PULMONARY NODULE  . DIVERTICULOSIS OF COLON  . ACTINIC SKIN DAMAGE  . DEGENERATIVE JOINT DISEASE  . WEAKNESS  . DYSPNEA  . OTHER DYSPHAGIA  . Epidural hematoma    Past Medical History:  Past Medical History  Diagnosis Date  . COPD (chronic obstructive pulmonary disease)   . Pneumonia, organism unspecified   . Pulmonary nodule   . HTN (hypertension)   . CHF (congestive heart failure)   . Atrial fibrillation   . Drug therapy   . PVC (premature ventricular contraction)   . PVD (peripheral vascular disease)   . Thoracic aortic aneurysm   . Hypercholesteremia   . Diverticulosis of colon   . Colon polyps   . Prostate cancer   . DJD (degenerative joint disease)   . Actinic skin damage   . Hyperlipidemia   . Internal hemorrhoids    Past Surgical History:  Past Surgical History  Procedure Date  . Total knee arthroplasty     bilateral  . Abdominal aortic aneurysm repair 1998  . Appendectomy   . Inguinal hernia repair     left  . Joint replacement     Assessment & Plan Clinical Impression: Patient is a 78 y.o. year old male with recent admission to the hospital on with past medical history atrial fibrillation with chronic Coumadin therapy and abdominal aortic aneurysm repair 1998 and progressive thoraco abdominal aneurysm to 5.9 cm which was  electively repaired at [facility] endovascularly on 01/01/2012. He had a lumbar drain placed for 36 hours was discharged home in stable condition on 01/04/2012. He then presented 01/05/2012 from an outside hospital after being seen at [facility] emergency room with paresthesias, worsening back pain and lower extremity weakness. MRI showed lumbar epidural hematoma. He was transferred to [facility] at that time per neurosurgery Dr. Tressie Stalker. He was taken to the operating room on 4/12 for multilevel laminectomy and epidural hematoma evacuation by Dr. Damita Lack. Patient completed Decadron protocol. Postoperative urinary retention with Foley catheter tube inserted 01/07/2012 and placed on Flomax. Bouts of hyponatremia 126 and monitored. For his history of atrial fibrillation he was continued on his home dose of atenolol however given his recent epidural hematoma and surgery his Coumadin was held. Per neurosurgery it will be safe to bridge Coumadin with Lovenox and restart aspirin one month from the date of his surgery.[name] He is presently maintained on subcutaneous heparin for deep vein thrombosis prophylaxis.  Patient transferred to CIR on 01/11/2012 .   Patient currently requires mod with mobility secondary to muscle weakness and decreased sitting balance, decreased standing balance, decreased postural control and decreased balance strategies.  Prior to hospitalization, patient was independent with mobility and lived with Spouse in a House home.  Home access is 4-5Stairs to enter.  Patient will benefit from skilled PT intervention to maximize safe functional mobility, minimize fall risk and decrease caregiver  burden for planned discharge home with 24 hour supervision.  Anticipate patient will benefit from follow up HH at discharge.  PT - End of Session Activity Tolerance: Decreased this session (lethargic during session with periods of alertness) PT Assessment Rehab Potential: Good Barriers to  Discharge: Decreased caregiver support (son can take FMLA but not longterm; wife requires assist ) PT Plan PT Frequency: 1-2 X/day, 60-90 minutes Estimated Length of Stay: 10-12 days PT Treatment/Interventions: Ambulation/gait training;Balance/vestibular training;Cognitive remediation/compensation;Community reintegration;Discharge planning;DME/adaptive equipment instruction;Functional electrical stimulation;Functional mobility training;Neuromuscular re-education;Pain management;Patient/family education;Psychosocial support;Splinting/orthotics;Stair training;Therapeutic Activities;Therapeutic Exercise;UE/LE Strength taining/ROM;UE/LE Coordination activities;Visual/perceptual remediation/compensation;Wheelchair propulsion/positioning PT Recommendation Follow Up Recommendations: Home health PT;24 hour supervision/assistance Equipment Recommended: Rolling walker with 5" wheels (w/c TBD)  PT Evaluation Precautions/Restrictions Precautions Precautions: Fall;Other (comment) (no written orders regarding back precautions) Restrictions Weight Bearing Restrictions: No   Pain C/o some discomfort in back with mobility. Premedicated  Home Living/Prior Functioning Home Living Lives With: Spouse Available Help at Discharge: Family (son can take FMLA but not longterm; wife requires Assist) Type of Home: House Home Access: Stairs to enter [employer] of Steps: 4-5 Entrance Stairs-Rails: None (wide platform stairs where a walker will fit) Home Layout: One level Bathroom Shower/Tub: Engineer, manufacturing systems: Handicapped height Bathroom Accessibility: Yes How Accessible: Accessible via walker Prior Function Level of Independence: Independent with basic ADLs;Independent with gait;Independent with transfers Able to Take Stairs?: Yes Driving: Yes Vocation: Retired Optometrist - History Baseline Vision: Wears glasses all the time Patient Visual Report: No change from  baseline Perception Perception: Within Functional Limits Praxis Praxis: Intact  Cognition Overall Cognitive Status: Impaired (due to lethargy; son believes related to anesthesia/meds) Arousal/Alertness: Lethargic Orientation Level: Oriented X4 Safety/Judgment: Impaired Sensation Sensation Light Touch: Appears Intact (reports "a little off" in LEs) Stereognosis: Not tested Hot/Cold: Not tested Proprioception: Appears Intact Coordination Gross Motor Movements are Fluid and Coordinated: Yes Fine Motor Movements are Fluid and Coordinated: Yes    Trunk/Postural Assessment  Cervical Assessment Cervical Assessment:  (forward head; [facility]) Thoracic Assessment Thoracic Assessment:  (flexed posture) Lumbar Assessment Lumbar Assessment:  (not tested due to unclear if has back precautions) Postural Control Postural Control: Deficits on evaluation (decreased balance strategies; flexed posture with gait/stand)  Balance Balance Balance Assessed: Yes Static Sitting Balance Static Sitting - Level of Assistance: 4: Min assist Dynamic Sitting Balance Dynamic Sitting - Level of Assistance: 3: Mod assist (LOB posterior, unable to correct without ModA) Static Standing Balance Static Standing - Level of Assistance: 4: Min assist (with bilat UE support) Dynamic Standing Balance Dynamic Standing - Level of Assistance: 3: Mod assist Extremity Assessment  RUE Assessment RUE Assessment: Within Functional Limits LUE Assessment LUE Assessment: Within Functional Limits RLE Assessment RLE Assessment:  (grossly 3+/5 strength; functionally weak in hips) LLE Assessment LLE Assessment:  (grossly 3+/5 strength; functionally weak in hips)  See FIM for current functional status Refer to Care Plan for Long Term Goals  Recommendations for other services: None  Discharge Criteria: Patient will be discharged from PT if patient refuses treatment 3 consecutive times without medical reason, if treatment goals  not met, if there is a change in medical status, if patient makes no progress towards goals or if patient is discharged from hospital.  The above assessment, treatment plan, treatment alternatives and goals were discussed and mutually agreed upon: by patient and by family  Individual treatment initiated with focus on sit to stands and transfer training with use of RW; cueing for technique and for safety. Pt with flexed  posture in standing  and progressed to more flexed as pt fatigued. Gait with RW with min/mod A x 50'; cueing for posture and hip extension; as fatigued started to sink down. Pt recognized he was unsteady on his feet. Son present during session and provided good insight into pt's level prior (very active, taking care of his wife). Son appears concerned with possibility of need for long term supervision after d/c due to him and his sister still work full time and pt's wife also requires some physical assist. Discussed goals at this time with pt's son and pt also appears to be lethargic still from medication/anesthesia and we would wait to see how the next few days progress.  Karolee Stamps [facility] 01/12/2012, 11:53 AM

## 2012-01-12 NOTE — Progress Notes (Addendum)
Inpatient Rehabilitation Center Individual Statement of Services  Patient Name:  Ryan Hess  Date:  01/12/2012  Welcome to the Inpatient Rehabilitation Center.  Our goal is to provide you with an individualized program based on your diagnosis and situation, designed to meet your specific needs.  With this comprehensive rehabilitation program, you will be expected to participate in at least 3 hours of rehabilitation therapies Monday-Friday, with modified therapy programming on the weekends.  Your rehabilitation program will include the following services:  Physical Therapy (PT), Occupational Therapy (OT), 24 hour per day rehabilitation nursing, Therapeutic Recreaction (TR), Case Management (RN and Child psychotherapist), Rehabilitation Medicine, Nutrition Services and Pharmacy Services  Weekly team conferences will be held on Tuesdays to discuss your progress.  Your RN Case Designer, television/film set will talk with you frequently to get your input and to update you on team discussions.  Team conferences with you and your family in attendance may also be held.  Expected length of stay: 10-12 days  Overall anticipated outcome: supervision  Depending on your progress and recovery, your program may change.  Your RN Case Estate agent will coordinate services and will keep you informed of any changes.  Your RN Sports coach and SW names and contact numbers are listed  below.  The following services may also be recommended but are not provided by the Inpatient Rehabilitation Center:   Driving Evaluations  Home Health Rehabiltiation Services  Outpatient Rehabilitatation [facility]  Vocational Rehabilitation   Arrangements will be made to provide these services after discharge if needed.  Arrangements include referral to agencies that provide these services.  Your insurance has been verified to [employer]   Your primary doctor is:  Dr Kriste Basque  Pertinent information will be shared with  your doctor and your insurance company.  Case Manager: Melanee Spry, [facility] [phone]  Social Worker:  [name], [place] [phone]  Information discussed withpt and daughter and copy given to patient by: Meryl Dare, 01/12/2012

## 2012-01-12 NOTE — Progress Notes (Signed)
Occupationall Therapy Note  Patient Details  Name: ARIO MCDIARMID MRN: [id number] Date of Birth: 1924/03/14 Today's Date: 01/12/2012 Time:  1430-1500  (30 min) Individual Therapy Pain:  None Pt. Engaged in Upper extremity exercises using 3 # weights He needed moderate assist to complete exercises due to fatigue.  OT transferred him to bed at end of session with minimal assist.  .    Humberto Seals 01/12/2012, 7:19 PM

## 2012-01-13 DIAGNOSIS — M48061 Spinal stenosis, lumbar region without neurogenic claudication: Secondary | ICD-10-CM

## 2012-01-13 DIAGNOSIS — G8220 Paraplegia, unspecified: Secondary | ICD-10-CM

## 2012-01-13 DIAGNOSIS — Z5189 Encounter for other specified aftercare: Secondary | ICD-10-CM

## 2012-01-13 NOTE — Progress Notes (Signed)
Per report from therapy patient alert with confusion at times. States  im just tired. Will continue to monitor

## 2012-01-13 NOTE — Progress Notes (Signed)
Physical Therapy Note  Patient Details  Name: TARRELL DEBES MRN: [id number] Date of Birth: 06-02-1924 Today's Date: 01/13/2012 Time: 1330-1430 (60') Pain: None Precautions: Spinal, Fall Risk  Patient was in long-sitting in bed with daughter present and assisting with feeding lunch.  Patient was needed continual verbal cues to stay awake/open eyes.  Patient was chewing for a very long time without swallowing food that was in his mouth.    Therapeutic Activity: (45') Bed Mobility Mod-A to initiate and to assist with sequencing for Right side lying to sit transfer and to scoot forward to edge of bed., Transfers sit<->stand x 5 with Mod-A and verbal cues for hand placement with hand held assist and to stand upright since patient tending to stand with flexed knees.  Transfers bed<->w/c Mod-A with hand held assist. Gait Training (15') Hand held assist 2 x 5' with Mod-A.                    Limited ability of patient to take steps secondary to fatigue/lethargy, weakness and possible medication reaction causing sleepiness.  Nursing to continue to monitor for mental status/alertness and daughter reporting that patient is extremely sensitive to any kind of pain pills (Narcotics) and desiring that patient get Tylenol if pain can be adequately managed this way.  Individual Therapy Session   Rex Kras 01/13/2012, 2:35 PM

## 2012-01-13 NOTE — Progress Notes (Signed)
Patient ID: Ryan Hess, male   DOB: 23-Dec-1923, 78 y.o.   MRN: [id number] Patient ID: Ryan Hess, male   DOB: 1924-06-16, 78 y.o.   MRN: [id number]  Subjective/Complaints:  4/20.  Alert and offers no complaints this morning. Had a comfortable night. Chest clear to auscultation. Cardiovascular- S1-S2 normal without tachycardia. Abdomen benign. Extremities no edema.  BP Readings from Last 3 Encounters:  01/13/12 138/71  01/05/12 123/79  11/30/11 21/43   78 year old right-handed white male with past medical history atrial fibrillation with chronic Coumadin therapy and abdominal aortic aneurysm repair 1998 and progressive thoraco abdominal aneurysm to 5.9 cm which was electively repaired at [facility] endovascularly on 01/01/2012. He had a lumbar drain placed for 36 hours was discharged home in stable condition on 01/04/2012. He then presented 01/05/2012 from an outside hospital after being seen at [facility] emergency room with paresthesias, worsening back pain and lower extremity weakness. MRI showed lumbar epidural hematoma extending from L1-L3. It was severe compression of the L2 and L3 nerve roots due to the epidural hematoma. In addition there was moderately severe spinal stenosis at L3-4 and moderate spinal stenosis at L4-5.[name] He was transferred to [facility] at that time per neurosurgery Dr. Tressie Stalker. He was taken to the operating room on 4/12 for multilevel laminectomy and epidural hematoma evacuation by Dr. Damita Lack Somnolent this am bur without c/os Review of Systems  Unable to perform ROS: mental acuity    Objective: Vital Signs: Blood pressure 138/71, pulse 67, temperature 97.9 F (36.6 C), temperature source Oral, resp. rate 19, height 5\' 9"  (1.753 m), weight 188 lb 3.2 oz (85.367 kg), SpO2 95.00%. No results found. Results for orders placed during the hospital encounter of 01/11/12 (from the past 72 hour(s))  GLUCOSE, CAPILLARY     Status: Abnormal   Collection Time   01/11/12  5:19 PM      Component Value Range Comment   Glucose-Capillary 104 (*) 70 - 99 (mg/dL)    Comment 1 Notify RN     MRSA PCR SCREENING     Status: Normal   Collection Time   01/11/12  8:49 PM      Component Value Range Comment   MRSA by PCR NEGATIVE  NEGATIVE    GLUCOSE, CAPILLARY     Status: Abnormal   Collection Time   01/11/12  9:14 PM      Component Value Range Comment   Glucose-Capillary 139 (*) 70 - 99 (mg/dL)    Comment 1 Notify RN     CBC     Status: Abnormal   Collection Time   01/12/12  5:00 AM      Component Value Range Comment   WBC 12.2 (*) 4.0 - 10.5 (K/uL)    RBC 4.09 (*) 4.22 - 5.81 (MIL/uL)    Hemoglobin 12.7 (*) 13.0 - 17.0 (g/dL)    HCT 56.2 (*) 13.0 - 52.0 (%)    MCV 89.5  78.0 - 100.0 (fL)    MCH 31.1  26.0 - 34.0 (pg)    MCHC 34.7  30.0 - 36.0 (g/dL)    RDW 86.5  78.4 - 69.6 (%)    Platelets 250  150 - 400 (K/uL)   COMPREHENSIVE METABOLIC PANEL     Status: Abnormal   Collection Time   01/12/12  5:00 AM      Component Value Range Comment   Sodium 125 (*) 135 - 145 (mEq/L)    Potassium 3.9  3.5 -  5.1 (mEq/L)    Chloride 91 (*) 96 - 112 (mEq/L)    CO2 25  19 - 32 (mEq/L)    Glucose, Bld 100 (*) 70 - 99 (mg/dL)    BUN 19  6 - 23 (mg/dL)    Creatinine, Ser 4.33  0.50 - 1.35 (mg/dL)    Calcium 8.1 (*) 8.4 - 10.5 (mg/dL)    Total Protein 5.3 (*) 6.0 - 8.3 (g/dL)    Albumin 2.4 (*) 3.5 - 5.2 (g/dL)    AST 45 (*) 0 - 37 (U/L)    ALT 34  0 - 53 (U/L)    Alkaline Phosphatase 172 (*) 39 - 117 (U/L)    Total Bilirubin 2.5 (*) 0.3 - 1.2 (mg/dL)    GFR calc non Af Amer 77 (*) >90 (mL/min)    GFR calc Af Amer 89 (*) >90 (mL/min)   DIFFERENTIAL     Status: Abnormal   Collection Time   01/12/12  5:00 AM      Component Value Range Comment   Neutrophils Relative 83 (*) 43 - 77 (%)    Neutro Abs 10.1 (*) 1.7 - 7.7 (K/uL)    Lymphocytes Relative 9 (*) 12 - 46 (%)    Lymphs Abs 1.1  0.7 - 4.0 (K/uL)    Monocytes Relative 7  3 - 12 (%)     Monocytes Absolute 0.8  0.1 - 1.0 (K/uL)    Eosinophils Relative 1  0 - 5 (%)    Eosinophils Absolute 0.1  0.0 - 0.7 (K/uL)    Basophils Relative 0  0 - 1 (%)    Basophils Absolute 0.0  0.0 - 0.1 (K/uL)         Assessment/Plan: 1. Functional deficits secondary to Paraparesis due to epidural hematoma L2,L3 compression which require 3+ hours per day of interdisciplinary therapy in a comprehensive inpatient rehab setting. Physiatrist is providing close team supervision and 24 hour management of active medical problems listed below. Physiatrist and rehab team continue to assess barriers to discharge/monitor patient progress toward functional and medical goals. FIM: FIM - Bathing Bathing Steps Patient Completed: Chest;Right Arm;Left Arm;Abdomen;Front perineal area;Buttocks;Right upper leg;Left upper leg Bathing: 4: Min-Patient completes 8-9 74f 10 parts or 75+ percent  FIM - Upper Body Dressing/Undressing Upper body dressing/undressing steps patient completed: Thread/unthread right sleeve of pullover shirt/dresss;Thread/unthread left sleeve of pullover shirt/dress;Put head through opening of pull over shirt/dress;Pull shirt over trunk Upper body dressing/undressing: 5: Set-up assist to: Obtain clothing/put away FIM - Lower Body Dressing/Undressing Lower body dressing/undressing steps patient completed: Pull underwear up/down Lower body dressing/undressing: 1: Total-Patient completed less than 25% of tasks  FIM - Toileting Toileting steps completed by patient: Performs perineal hygiene Toileting: 2: Max-Patient completed 1 of 3 steps  FIM - Diplomatic Services operational officer Devices: Elevated toilet seat Toilet Transfers: 3-To toilet/BSC: Mod A (lift or lower assist);3-From toilet/BSC: Mod A (lift or lower assist)  FIM - Bed/Chair Transfer Bed/Chair Transfer Assistive Devices: Manufacturing systems engineer Transfer: 3: Supine > Sit: Mod A (lifting assist/Pt. 50-74%/lift 2 legs;2: Supine >  Sit: Max A (lifting assist/Pt. 25-49%);3: Bed > Chair or W/C: Mod A (lift or lower assist);3: Chair or W/C > Bed: Mod A (lift or lower assist)  FIM - Locomotion: Wheelchair Locomotion: Wheelchair: 1: Total Assistance/staff pushes wheelchair (Pt<25%) (gait to be primary means of mobility) FIM - Locomotion: Ambulation Locomotion: Ambulation Assistive Devices: Walker - Rolling Locomotion: Ambulation: 2: Travels 50 - 149 ft with moderate assistance (Pt: 50 -  74%) (min to mod A; as fatigued more mod A)  Comprehension Comprehension Mode: Auditory Comprehension: 5-Follows basic conversation/direction: With extra time/assistive device  Expression Expression Mode: Verbal Expression: 5-Expresses basic needs/ideas: With extra time/assistive device  Social Interaction Social Interaction: 4-Interacts appropriately 75 - 89% of the time - Needs redirection for appropriate language or to initiate interaction.  Problem Solving Problem Solving: 5-Solves basic problems: With no assist  Memory Memory: 4-Recognizes or recalls 75 - 89% of the time/requires cueing 10 - 24% of the time  2. Anticoagulation/DVT prophylaxis with Pharmaceutical: Heparin  Medical Problem List and Plan:  1. lumbar epidural hematoma with paraparesis. 4 level laminectomy and evacuation of lumbar L2-3 epidural hematoma 01/05/2012  2. DVT Prophylaxis/Anticoagulation: Subcutaneous heparin. Monitor platelet counts and any signs of bleeding  3. Pain Management: Oxycodone 5 mg every 4-6 hours as needed  4. abdominal aortic aneurysm/thoraco abdominal aneurysm. Endovascularly repaired 01/01/2012. Will need follow[facility] at [facility]  5. Chronic atrial fibrillation. Cardiac rate control. Continue atenolol 50 mg daily. Coumadin and aspirin remain on hold for one month from date of surgery 01/05/2012 due to epidural hematoma  6. Urinary retention. Discontinue Foley catheter tube. Continue Flomax. Check PVRs x3  7. Congestive heart failure.  Lasix 20 mg twice daily. Monitor for any signs of fluid overload  8. Hyponatremia. Latest sodium level 125 hold lasix. Recheck BMET in 3-4 days 9. COPD. Continue Spiriva and Flonase.  10. Hypertension. Continue losartan 100 mg daily. Monitor for any signs of orthostasis  11.  Somnolence suspect pain meds monitor 11. Hyperlipidemia.atorvastatin    LOS (Days) 2 A FACE TO FACE EVALUATION WAS PERFORMED  Rogelia Boga 01/13/2012, 9:59 AM

## 2012-01-13 NOTE — Progress Notes (Signed)
Unable to void on own. 1400 void 0 cath 400 . Continues with periods of confusion. Tiredness . Arouses to touch.  Alert to self and place.

## 2012-01-13 NOTE — Progress Notes (Signed)
Physical Therapy Note  Patient Details  Name: GIULIO BERTINO MRN: [id number] Date of Birth: [birth year] Today's Date: 01/13/2012 Time: 1015-1100 Pain: Precautions: Spinal, Falls Risk Difficult to arouse with verbal and tactile stimuli Vitals: BP 131/78, HR 80, O2 Sats 92 on RA  Therapeutic Exercise: (15') B LE's in supine requiring P/AAROM Therapeutic Activity: (30')  Bed Mobility Total Assist required, monitored vitals, discussion with OTA and Nursing. Patient with ongoing difficulty to arouse and communication to nursing and previous OTA therapist to determine if this is indeed a decline in mental alertness and status.  Individual Therapy Session   Rex Kras 01/13/2012, 10:38 AM

## 2012-01-13 NOTE — Progress Notes (Signed)
Occupational Therapy Session Note  Patient Details  Name: Ryan Hess MRN: [id number] Date of Birth: Jul 14, 1924  Today's Date: 01/13/2012 Time: 0700-0800 Time Calculation (min): 60 min  Short Term Goals: Week 1:  OT Short Term Goal 1 (Week 1): Patient will perform LB dressing with min assist using AE prn OT Short Term Goal 2 (Week 1): Patient will perform toilet transfers with min assist using AD and DME prn OT Short Term Goal 3 (Week 1): Patient will perform tub/shower transfer with min assist using DME prn OT Short Term Goal 4 (Week 1): Patient will perform grooming tasks independently (LTG)  Skilled Therapeutic Interventions/Progress Updates:   ADL retraining including bathing and dressing w/c level at sink.  Pt initially did not c/o pain but when performing sit to stand pt stated that that first move always hurts.  Pt initially required mod A for standing but progressed to min A as session continued.  Pt required increased assistance with LB bathing and dressing.  Pt incontinent of loose bowel after pants donned and requested to use toilet.  Pt agreed that wearing a brief would be a good idea.  Pt requested to get back in bed after using toilet, stating "I am worn out."  Assisted pt to bed and placed call bell with him.  Notified RN on loose bowel movement.  Therapy Documentation Precautions:  Precautions Precautions: Fall;Other (comment) (no written orders regarding back precautions) Restrictions Weight Bearing Restrictions: No General:   Pain: Pain Assessment Pain Assessment: No/denies pain  See FIM for current functional status  Therapy/Group: Individual Therapy  Rich Brave 01/13/2012, 10:21 AM

## 2012-01-13 NOTE — Progress Notes (Signed)
Social Work  Social Work Assessment and Plan  Patient Details  Name: Ryan Hess MRN: [id number] Date of Birth: [birth year]  Today's Date: 01/13/2012  Problem List:  Patient Active Problem List  Diagnoses  . PROSTATE CANCER  . COLONIC POLYPS  . HYPERCHOLESTEROLEMIA  . HYPERTENSION  . ATRIAL FIBRILLATION  . PREMATURE VENTRICULAR CONTRACTIONS, FREQUENT  . CONGESTIVE HEART FAILURE  . THORACIC AORTIC ANEURYSM  . PERIPHERAL VASCULAR DISEASE  . PNEUMONIA, ORGANISM UNSPECIFIED  . COPD  . PULMONARY NODULE  . DIVERTICULOSIS OF COLON  . ACTINIC SKIN DAMAGE  . DEGENERATIVE JOINT DISEASE  . WEAKNESS  . DYSPNEA  . OTHER DYSPHAGIA  . Epidural hematoma   Past Medical History:  Past Medical History  Diagnosis Date  . COPD (chronic obstructive pulmonary disease)   . Pneumonia, organism unspecified   . Pulmonary nodule   . HTN (hypertension)   . CHF (congestive heart failure)   . Atrial fibrillation   . Drug therapy   . PVC (premature ventricular contraction)   . PVD (peripheral vascular disease)   . Thoracic aortic aneurysm   . Hypercholesteremia   . Diverticulosis of colon   . Colon polyps   . Prostate cancer   . DJD (degenerative joint disease)   . Actinic skin damage   . Hyperlipidemia   . Internal hemorrhoids    Past Surgical History:  Past Surgical History  Procedure Date  . Total knee arthroplasty     bilateral  . Abdominal aortic aneurysm repair 1998  . Appendectomy   . Inguinal hernia repair     left  . Joint replacement    Social History:  reports that he quit smoking about 23 years ago. His smoking use included Cigarettes. He has a 88 pack-year smoking history. He has never used smokeless tobacco. He reports that he does not drink alcohol or use illicit drugs.  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) Spouse/Significant Other: wife, Malakii Harl, @ [phone] Children: son, Ogden Malina @ [phone] who lives directly behind pt and wife; son, Carlo Hopkins of [place], [place]; daughter, Pollyann Savoy of [facility] Anticipated Caregiver: son, Coralyn Mark to take some time off to assist Ability/Limitations of Caregiver: local son and daughter both work f/t (son "on" 3 days/wk and "off" 3 days Caregiver Availability: 24/7 (initially pt's son to provide 24/7 ) Family Dynamics: son notes all children are close with their parents and will provide as much support as able given their work responsibilities - no stressors in family noted/ reported  Social History Preferred language: English Religion: [employer] Cultural Background: NA Education: HS grad plus 2 years in [employer] Read: Yes Write: Yes Employment Status: Retired Date Retired/Disabled/Unemployed: approx. 20 years ago Legal Hisotry/Current Legal Issues: none Guardian/Conservator: none   Abuse/Neglect Physical Abuse: Denies Verbal Abuse: Denies Sexual Abuse: Denies Exploitation of patient/patient's resources: Denies Self-Neglect: Denies  Emotional Status Pt's affect, behavior adn adjustment status: pt very fatigued, lying in bed and attempting to answer questions but dozing off - son provides information and notes that pt has not had very good sleep and having difficult time staying awake.  Son admits pt and family are frustrated with medical complications following AAA surgery, "...but we are gonna stay positive".  Son notes he has not witnessed any significant emotional distress from his father, but this SW will monitor throughout stay Recent Psychosocial Issues: no significant issues per son except general aging issues for parents and  overall declining function Pyschiatric History: none Substance Abuse History: none  Patient / Family Perceptions, Expectations & Goals Pt/Family understanding of illness & functional limitations: Pt able to report that he had complication with "bleeding in  my back" following initial surgery.  Son with better understanding of epidural hematoma and evacuation procedure perform plus resulting functional deficits. Premorbid pt/family roles/activities: Son reports that pt was providing primary support to wife (supervision overall) and home management/ financial needs.  Completely independent and still driving. While son and daughter are local and stay in close contact with pt and wife, they really were not needed to provide much assistance Anticipated changes in roles/activities/participation: Son aware that pt cannot fully resume primary Charity fundraiser" role initially upon return, therefore, he is prepared to assist as long as he is able Pt/family expectations/goals: Son hopeful that pt will be able to reach mod i function withing a few weeks as he will then have to be returning to work  [employer]: None Premorbid Home Care/DME Agencies: None Transportation available at discharge: yes  Discharge Planning Living Arrangements: Spouse/significant other;Children Support Systems: Spouse/significant other;Children Type of Residence: Private residence Insurance underwriter Resources: Medicare (**Corporate treasurer) Financial Resources: Radio broadcast assistant Screen Referred: No Living Expenses: Own Money Management: Patient Do you have any problems obtaining your medications?: No Home Management: patient - wife able to provide some minimal home management Patient/Family Preliminary Plans: Pt expected to return to his own home with son to stay initially to assist both parents Barriers to Discharge: Family Support (son's availability is time limited to a few weeks) Social Work Anticipated Follow Up Needs: HH/OP Expected length of stay: 14 days  Clinical Impression Elderly gentleman here after an epidural hematoma following AAA surgery.  Son at bedside and very supportive/ attentive. Son has good, basic understanding of medical issues, pt's  functional limitations and need for CIR and is hopeful pt will have ability to regain some independent function.  Son does have to return to work within a few weeks.  No significant emotional distress noted or reported by son, however, will monitor throughout pt's stay.  Kieffer Blatz  Trevan Messman, Thor 01/13/2012, 1:35 PM

## 2012-01-14 NOTE — Progress Notes (Signed)
  Patient ID: Ryan Hess, male DOB: [birth year], 78 y.o. MRN: [id number] Subjective: Much more interactive today - appropriate behavior, no complaints - reviewed family preference to avoid Oxy with rn  Objective: Vital signs in last 24 hours: Temp:  [97.9 F (36.6 C)-98.1 F (36.7 C)] 97.9 F (36.6 C) (04/21 0435) Pulse Rate:  [71-91] 91  (04/21 0435) Resp:  [18] 18  (04/21 0435) BP: (122-147)/(61-86) 122/61 mmHg (04/21 0435) SpO2:  [94 %-98 %] 95 % (04/21 0747) Weight change:  Last BM Date: 01/13/12  Intake/Output from previous day: 04/20 0701 - 04/21 0700 In: 720 [P.O.:720] Out: 1275 [Urine:1275] Last cbgs: CBG (last 3)   Basename 01/11/12 2114 01/11/12 1719  GLUCAP 139* 104*     Physical Exam General: No apparent distress    Lungs: Normal effort. Lungs clear to auscultation, no crackles or wheezes. Cardiovascular: irregular rate and rhythm, no edema Musculoskeletal:  Neurovascularly intact Neurological: No new neurological deficits Wounds: Clean, dry, intact. No signs of infection.   Lab Results:  Basename 01/12/12 0500  WBC 12.2*  HGB 12.7*  HCT 36.6*  PLT 250   BMET  Basename 01/12/12 0500  NA 125*  K 3.9  CL 91*  CO2 25  GLUCOSE 100*  BUN 19  CREATININE 0.85  CALCIUM 8.1*    Studies/Results: No results found.  Medications: I have reviewed the patient's current medications.  Assessment:  1. Functional deficits secondary to Paraparesis due to epidural hematoma L2,L3 compression which require 3+ hours per day of interdisciplinary therapy in a comprehensive inpatient rehab setting.  Physiatrist is providing close team supervision and 24 hour management of active medical problems listed below.  Physiatrist and rehab team continue to assess barriers to discharge/monitor patient progress toward functional and medical goals.  Plan: 1. lumbar epidural hematoma with paraparesis. 4 level laminectomy and evacuation of lumbar L2-3 epidural hematoma  01/05/2012  2. DVT Prophylaxis/Anticoagulation: Subcutaneous heparin. Monitor platelet counts and any signs of bleeding  3. Pain Management: avoid Oxycodone 4. abdominal aortic aneurysm/thoraco abdominal aneurysm. Endovascularly repaired 01/01/2012. Will need followup at [facility]  5. Chronic atrial fibrillation. Cardiac rate control. Continue atenolol 50 mg daily. Coumadin and aspirin remain on hold for one month from date of surgery 01/05/2012 due to epidural hematoma  6. Urinary retention. Continue Flomax. Check PVRs x3, i/o prn  7. Congestive heart failure. Lasix 20 mg twice daily. Monitor for any signs of fluid overload  8. Hyponatremia. Latest sodium level 125. Recheck BMET Monday. 9. COPD. Continue Spiriva and Flonase.  10. Hypertension. Continue losartan 100 mg daily. Monitor for any signs of orthostasis  11. Somnolence, resolved. Suspect d/t pain meds - monitor  12. Hyperlipidemia.atorvastatin   LOS days 3   IRETON,SUSAN C , PA-C 01/14/2012, 9:26 AM   I agree with above.   Rene Paci MD  01/14/2012, 9:32 AM

## 2012-01-14 NOTE — Progress Notes (Signed)
Physical Therapy Note  Patient Details  Name: Ryan Hess MRN: [id number] Date of Birth: 03/14/1924 Today's Date: 01/14/2012  1300-1355 (55 minutes) group Pain: no complaints of pain Pt participated in PT group session focusing on gait safety/endurance. Pt alert. Sit to stand variable min to mod assist with max cues for hand placement on armrests. Pt ambulates 40 feet X 2 mod assist using RW with increased forward lena on AD.Standing throwing horseshoes with mod to max assist to maintain erect standing with increasing bilateral knee flexion. Stand X 1 using shoulder arc on bedside table mod assist with max tactile cues to sit safely using armrests.   [name],JIM 01/14/2012, 2:30 PM

## 2012-01-14 NOTE — Progress Notes (Signed)
Patient noted with changing positions from side to side while in the bed, putting sheet over head and pulled shirt off half way. Lethargic at times but arousable. Slept off and on tonight.

## 2012-01-15 LAB — BASIC METABOLIC PANEL
BUN: 19 mg/dL (ref 6–23)
CO2: 21 mEq/L (ref 19–32)
Calcium: 8.2 mg/dL — ABNORMAL LOW (ref 8.4–10.5)
Chloride: 89 mEq/L — ABNORMAL LOW (ref 96–112)
Creatinine, Ser: 0.76 mg/dL (ref 0.50–1.35)
GFR calc Af Amer: >90 mL/min (ref >90)
GFR calc non Af Amer: 80 mL/min — ABNORMAL LOW (ref >90)
Glucose, Bld: 97 mg/dL (ref 70–99)
Potassium: 4.7 mEq/L (ref 3.5–5.1)
Sodium: 121 mEq/L — ABNORMAL LOW (ref 135–145)

## 2012-01-15 MED ORDER — TRAMADOL HCL 50 MG PO TABS
50.0000 mg | ORAL_TABLET | Freq: Four times a day (QID) | ORAL | Status: DC
  Administered 2012-01-15 – 2012-01-16 (×4): 50 mg via ORAL
  Filled 2012-01-15 (×5): qty 1

## 2012-01-15 NOTE — Progress Notes (Signed)
Occupational Therapy Session Notes  Patient Details  Name: Ryan Hess MRN: [id number] Date of Birth: 1924/06/08  Today's Date: 01/15/2012  Short Term Goals: Week 1:  OT Short Term Goal 1 (Week 1): Patient will perform LB dressing with min assist using AE prn OT Short Term Goal 2 (Week 1): Patient will perform toilet transfers with min assist using AD and DME prn OT Short Term Goal 3 (Week 1): Patient will perform tub/shower transfer with min assist using DME prn OT Short Term Goal 4 (Week 1): Patient will perform grooming tasks independently (LTG)  Skilled Therapeutic Interventions/Progress Updates:   Session #1 [id number] Minutes Individual Therapy No complaints of pain Treatment emphasis on ADL retraining at sink level. Focused skilled intervention on UB/LB bathing & dressing, sit/stands, stand pivot transfers using rolling walker, functional ambulation throughout room using rolling walker, toilet transfer using elevated toilet seat and rolling walker, toileting (clothing management & peri hygiene), and overall activity tolerance/endurance. Patient with increased time to complete tasks, fatigue, and decreased cognition (decreased awareness, decreased problem solving, decreased memory, decreased orientation). Patient with eyes shut during sitting and with deep breaths in/out. Assisted patient to recliner at end of session with call bell and phone within reach. Educated patient on use of call bell prn.   Session #2 Patient scheduled for therapy 1300-1345. Upon entering room patient seated in recliner with eyes shut. Therapist introduced self and encouraged patient to participate, while therapist talked patient's eyes remained shut and patient with mumbled words. Encouraged patient to eat lunch seated in recliner, patient unable to follow simple command of "lift your feet" to put tray closer to patient; therapist had to physically lift patients feet with total assist. Patient unable to  stay awake to focus on any tasks at this time. Notified RN and donned quick release belt in recliner.  Patient missed 45 minutes of skilled occupational therapy.   Precautions:  Precautions Precautions: Back;Fall Restrictions Weight Bearing Restrictions: No  See FIM for current functional status  [name] 01/15/2012, 12:14 PM

## 2012-01-15 NOTE — Progress Notes (Signed)
Physical Therapy Session Note  Patient Details  Name: Ryan Hess MRN: [id number] Date of Birth: Sep 10, 1924  Today's Date: 01/15/2012 Time: 1401-1410 Time Calculation (min): 9 min  Short Term Goals: Week 1:  PT Short Term Goal 1 (Week 1): Pt will be able to transfer min A with RW PT Short Term Goal 2 (Week 1): Pt will be able to demonstrate dynamic sitting balance with supervision PT Short Term Goal 3 (Week 1): Pt will be able to do curb step with RW to simulate home entry with mod A  Skilled Therapeutic Interventions/Progress Updates:   Met by nursing tech on way to pt's room who reporting that pt was sleeping and could not arouse.  Could not get pt to wake up enough to participate in therapy.  Pt would answer repeated calls of his name and then immediately fall back to sleep.  Barely opening his eyes.  With +2 assist performed sit to stand, but with pt falling asleep in standing, then +2 to turn to the bed with RW.  Pt not able to follow commands or assist with sit to supine.  Therapy Documentation Precautions:  Precautions Precautions: Back;Fall Restrictions Weight Bearing Restrictions: No Pain:  Not assessed secondary to pt too lethargic.  Pt did grimace a few times while being moved.  See FIM for current functional status  Therapy/Group: Individual Therapy  Georges Mouse 01/15/2012, 2:23 PM

## 2012-01-15 NOTE — Progress Notes (Signed)
Patient ID: Ryan Hess, male   DOB: 03-18-1924, 78 y.o.   MRN: [id number]  Subjective/Complaints: 78 year old right-handed white male with past medical history atrial fibrillation with chronic Coumadin  and progressive thoraco abdominal aneurysm to 5.9 cm which was electively repaired at [facility] endovascularly on 01/01/2012. He had a lumbar drain placed for 36 hours was discharged home in stable condition on 01/04/2012. He then presented 01/05/2012 from an outside hospital after being seen at [facility] emergency room with paresthesias, worsening back pain and lower extremity weakness. MRI showed lumbar epidural hematoma extending from L1-L3. It was severe compression of the L2 and L3 nerve roots due to the epidural hematoma. In addition there was moderately severe spinal stenosis at L3-4 and moderate spinal stenosis at L4-5.Marland KitchenHe was taken to the operating room on 4/12 for multilevel laminectomy and epidural hematoma evacuation by Dr. Alycia Patten at [facility].  Oriented to person, not place or time.  Back pain last noc Review of Systems  Unable to perform ROS: mental acuity    Objective: Vital Signs: Blood pressure 149/78, pulse 92, temperature 97.4 F (36.3 C), temperature source Axillary, resp. rate 16, height '5\' 9"'$  (1.753 m), weight 85.367 kg (188 lb 3.2 oz), SpO2 98.00%. No results found. No results found for this or any previous visit (from the past 72 hour(s)).      Assessment/Plan: 1. Functional deficits secondary to Paraparesis due to epidural hematoma L2,L3 compression which require 3+ hours per day of interdisciplinary therapy in a comprehensive inpatient rehab setting. Physiatrist is providing close team supervision and 24 hour management of active medical problems listed below. Physiatrist and rehab team continue to assess barriers to discharge/monitor patient progress toward functional and medical goals. FIM: FIM - Bathing Bathing Steps Patient Completed: Chest Bathing: 1:  Total-Patient completes 0-2 of 10 parts or less than 25%  FIM - Upper Body Dressing/Undressing Upper body dressing/undressing steps patient completed: Thread/unthread right sleeve of pullover shirt/dresss;Thread/unthread left sleeve of pullover shirt/dress;Put head through opening of pull over shirt/dress;Pull shirt over trunk Upper body dressing/undressing: 1: Two helpers FIM - Lower Body Dressing/Undressing Lower body dressing/undressing steps patient completed: Pull underwear up/down Lower body dressing/undressing: 1: Total-Patient completed less than 25% of tasks  FIM - Toileting Toileting steps completed by patient: Performs perineal hygiene Toileting: 1: Two helpers  FIM - Radio producer Devices: Elevated toilet seat Toilet Transfers: 3-To toilet/BSC: Mod A (lift or lower assist);3-From toilet/BSC: Mod A (lift or lower assist)  FIM - Bed/Chair Transfer Bed/Chair Transfer Assistive Devices: Bed rails Bed/Chair Transfer: 4: Supine > Sit: Min A (steadying Pt. > 75%/lift 1 leg)  FIM - Locomotion: Wheelchair Locomotion: Wheelchair: 1: Total Assistance/staff pushes wheelchair (Pt<25%) FIM - Locomotion: Ambulation Locomotion: Ambulation Assistive Devices: Administrator Ambulation/Gait Assistance: 3: Mod assist Locomotion: Ambulation: 1: Travels less than 50 ft with moderate assistance (Pt: 50 - 74%)  Comprehension Comprehension Mode: Auditory Comprehension: 4-Understands basic 75 - 89% of the time/requires cueing 10 - 24% of the time  Expression Expression Mode: Verbal Expression: 3-Expresses basic 50 - 74% of the time/requires cueing 25 - 50% of the time. Needs to repeat parts of sentences.  Social Interaction Social Interaction: 4-Interacts appropriately 75 - 89% of the time - Needs redirection for appropriate language or to initiate interaction.  Problem Solving Problem Solving: 4-Solves basic 75 - 89% of the time/requires cueing 10 - 24% of  the time  Memory Memory: 3-Recognizes or recalls 50 - 74% of the time/requires cueing  25 - 49% of the time  2. Anticoagulation/DVT prophylaxis with Pharmaceutical: Heparin  Medical Problem List and Plan:  1. lumbar epidural hematoma with paraparesis. 4 level laminectomy and evacuation of lumbar L2-3 epidural hematoma 01/05/2012  2. DVT Prophylaxis/Anticoagulation: Subcutaneous heparin. Monitor platelet counts and any signs of bleeding  3. Pain Management: Oxycodone 5 mg every 4-6 hours as needed change to tramadol and monitor 4. abdominal aortic aneurysm/thoraco abdominal aneurysm. Endovascularly repaired 01/01/2012. Will need followup at [facility]  5. Chronic atrial fibrillation. Cardiac rate control. Continue atenolol 50 mg daily. Coumadin and aspirin remain on hold for one month from date of surgery 01/05/2012 due to epidural hematoma  6. Urinary retention. Discontinue Foley catheter tube. Continue Flomax. Check PVRs x3  7. Congestive heart failure. Lasix 20 mg twice daily. Monitor for any signs of fluid overload  8. Hyponatremia. Latest sodium level 125 hold lasix. Recheck BMET in 3-4 days 9. COPD. Continue Spiriva and Flonase.  10. Hypertension. Continue losartan 100 mg daily. Monitor for any signs of orthostasis  11.  Somnolence suspect pain meds D/C oxy, monitor 11. Hyperlipidemia.atorvastatin    LOS (Days) 4 A FACE TO FACE EVALUATION WAS PERFORMED  Adelfo Diebel E 01/15/2012, 7:05 AM

## 2012-01-15 NOTE — Progress Notes (Signed)
Recreational Therapy Session Note  Patient Details  Name: Ryan Hess MRN: [id number] Date of Birth: 03/09/1924 Today's Date: 01/15/2012  Order received and chart reviewed.  Discussed pt with OT, pt placed on HOLD for TR services at this time due to low activity tolerance.  Will continue to monitor. Dmiya Malphrus 01/15/2012, 3:42 PM

## 2012-01-15 NOTE — Progress Notes (Addendum)
Physical Therapy Session Note  Patient Details  Name: Ryan Hess MRN: [id number] Date of Birth: 1924-03-26  Today's Date: 01/15/2012 Time: 6213-0865 Time Calculation (min): 51 min S:  Pt confused, did not know where he was or why. Thought the therapist was going to the doctor. Short Term Goals: Week 1:  PT Short Term Goal 1 (Week 1): Pt will be able to transfer min A with RW PT Short Term Goal 2 (Week 1): Pt will be able to demonstrate dynamic sitting balance with supervision PT Short Term Goal 3 (Week 1): Pt will be able to do curb step with RW to simulate home entry with mod A  Skilled Therapeutic Interventions/Progress Updates:    Session limited due to pt seemingly lethargic, disoriented, seemed SOB, but O2 sats above 90.  Monitoring HR while doing mild exercise on Nustep.  Pt having difficulty attending to task.  Increased difficulty with sit to stand causing pt to only perform squat pivot transfers during session.     Therapy Documentation Precautions:  Precautions Precautions: Back;Fall Restrictions Weight Bearing Restrictions: No Vital Signs: Oxygen Therapy O2 Device: None (Room air) HR vary from 66-131, RN in to give meds, discussed HR with her. Pain: Pain Assessment Pain Assessment: 0-10 Pain Score: 10-Worst pain ever Pain Location: Back Mobility:  Supine to sit with mod@, with verbal instructional and physical cues.  Sit to stand with multiple attempts, max@.  Mod@ to stand with RW and pull up pants.  Squat pivot transfers with mod@ Balance:  Sitting balance EOB with mod to max @. Exercises:  NuStep exercises to "wake" pt up and for gentle activity, due to pain.  Set on workload =1, pt needing cues to stay on task, easily distracted.   See FIM for current functional status  Therapy/Group: Individual Therapy  Georges Mouse 01/15/2012, 10:53 AM

## 2012-01-15 NOTE — Progress Notes (Signed)
Per State Regulation 482.30 This chart was reviewed for medical necessity with respect to the patient's Admission/Duration of stay. Pt unable to fully participate in therapies today due to somnolence/lethargy with some confusion. Pain meds adjusted today. Requiring I&O caths. LBM 4/21 incontinent.  Meryl Dare                 Nurse Care Manager            Next Review Date: 01/19/12

## 2012-01-16 ENCOUNTER — Inpatient Hospital Stay (HOSPITAL_COMMUNITY): Payer: Medicare Other

## 2012-01-16 LAB — BASIC METABOLIC PANEL
BUN: 22 mg/dL (ref 6–23)
CO2: 24 mEq/L (ref 19–32)
Calcium: 8.4 mg/dL (ref 8.4–10.5)
Chloride: 91 mEq/L — ABNORMAL LOW (ref 96–112)
Creatinine, Ser: 0.82 mg/dL (ref 0.50–1.35)
GFR calc Af Amer: >90 mL/min (ref >90)
GFR calc non Af Amer: 78 mL/min — ABNORMAL LOW (ref >90)
Glucose, Bld: 90 mg/dL (ref 70–99)
Potassium: 4 mEq/L (ref 3.5–5.1)
Sodium: 124 mEq/L — ABNORMAL LOW (ref 135–145)

## 2012-01-16 LAB — URINALYSIS, ROUTINE W REFLEX MICROSCOPIC
Glucose, UA: NEGATIVE mg/dL
Hgb urine dipstick: NEGATIVE
Ketones, ur: NEGATIVE mg/dL
Nitrite: NEGATIVE
Protein, ur: NEGATIVE mg/dL
Specific Gravity, Urine: 1.028 (ref 1.005–1.030)
Urobilinogen, UA: 4 mg/dL — ABNORMAL HIGH (ref 0.0–1.0)
pH: 5.5 (ref 5.0–8.0)

## 2012-01-16 LAB — URINE MICROSCOPIC-ADD ON

## 2012-01-16 NOTE — Significant Event (Signed)
Nurse called to room. Pt with 79% 02 sat. Pt c/o pain, and stated that bugs were crawling over him. BP 129/115. Pt placed on 02 at 3L N/C. Rechecked BP manually. BP 120/70. 02 sat at 99%. PA Pam Love aware. Order received for CXR PA/Lateral. Urine sample sent to lab .

## 2012-01-16 NOTE — Progress Notes (Signed)
Physical Therapy Session Note  Patient Details  Name: Ryan Hess MRN: [id number] Date of Birth: 19-Sep-1924  Today's Date: 01/16/2012 Time: 5621-3086 Time Calculation (min): 43 min  Short Term Goals: Week 1:  PT Short Term Goal 1 (Week 1): Pt will be able to transfer min A with RW PT Short Term Goal 2 (Week 1): Pt will be able to demonstrate dynamic sitting balance with supervision PT Short Term Goal 3 (Week 1): Pt will be able to do curb step with RW to simulate home entry with mod A  Skilled Therapeutic Interventions/Progress Updates: Pt very lethargic during session, responding mostly to his name and tactile stimulation for a few seconds at a time and would continue to fall asleep again. Pt in w/c upon therapist entering the room; disoriented to place, time and situation responding that he was "home and unlocked." Reoriented pt to date, place, and general situation. Attempted to arouse and engage patient with ball toss, participated in a few repetitions of throwing ball and catching and at basketball hoop less than 50% of the time and returned back to sleep. Max A + 2 for sit to stand and gait with RW x 20', pt maintained eyes shut and required mod to max A overall (+2 for safety); as fatigued total A with uncontrolled descent into w/c. Returned to pt room due to pt unable to maintain eyes open or participate functionally; gait to recliner with mod A + 2 again keeping eyes shut and continuing to sink (knees flexed) as progressed. Safety belt applied and legs elevated for positioning and safety; notified RN and Tech of pt's status and positioning. Pt asleep in chair when therapist left room. Unable to finish therapy session.     Therapy Documentation Precautions:  Precautions Precautions: Back;Fall Restrictions Weight Bearing Restrictions: No General: Amount of Missed PT Time (min): 17 Minutes Missed Time Reason: Patient fatigue;Other (comment) (see note; pt unable to maintain  alertness) Pain: Pt states "a little sore" but unable to state where or give a #; pt continuing to fall asleep during session  See FIM for current functional status  Therapy/Group: Individual Therapy  Karolee Stamps [facility] 01/16/2012, 11:26 AM

## 2012-01-16 NOTE — Progress Notes (Signed)
Lethargic most of shift,  unable to participate in  therapy. PA Harvel Ricks aware. Order received for a UA,and discontinuing of scheduled Tramadol 50mg . Last bowel movement 01/16/12. Incontinent of bowel. Expelled small amount of stool when standing, per therapy report. Requires I&O cath q 8hrs. Lumbar Incision with redness along incisional line, sutures intact, dry dressing to area. Red abrasion to R proximal end of the incision. Abrasion covered with tegaderm. R elbow with skin tear x 2. Tegaderm to area. LLE edematous, elevating when not in use.. Bruising to abdomen due to heparin injections.  Sacrum pink, blanchable. EPBC applied.

## 2012-01-16 NOTE — Progress Notes (Signed)
Patient ID: Ryan Hess, male   DOB: [birth year], 78 y.o.   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 He had a lumbar drain placed for 36 hours was discharged home in stable condition on 01/04/2012. He then presented 01/05/2012 from an outside hospital after being seen at [facility] emergency room with paresthesias, worsening back pain and lower extremity weakness. MRI showed lumbar epidural hematoma extending from L1-L3. It was severe compression of the L2 and L3 nerve roots due to the epidural hematoma. In addition there was moderately severe spinal stenosis at L3-4 and moderate spinal stenosis at L4-5.Marland KitchenHe was taken to the operating room on 4/12 for multilevel laminectomy and epidural hematoma evacuation by Dr. Damita Lack at [facility].  Oriented to person, not place or time.  Back pain no worse off Oxy Review of Systems  Unable to perform ROS: mental acuity    Objective: Vital Signs: Blood pressure 105/85, pulse 66, temperature 98.2 F (36.8 C), temperature source Oral, resp. rate 20, height 5\' 9"  (1.753 m), weight 85.367 kg (188 lb 3.2 oz), SpO2 94.00%. No results found. Results for orders placed during the hospital encounter of 01/11/12 (from the past 72 hour(s))  BASIC METABOLIC PANEL     Status: Abnormal   Collection Time   01/15/12  6:16 AM      Component Value Range Comment   Sodium 121 (*) 135 - 145 (mEq/L)    Potassium 4.7  3.5 - 5.1 (mEq/L) HEMOLYSIS AT THIS LEVEL MAY AFFECT RESULT   Chloride 89 (*) 96 - 112 (mEq/L)    CO2 21  19 - 32 (mEq/L)    Glucose, Bld 97  70 - 99 (mg/dL)    BUN 19  6 - 23 (mg/dL)    Creatinine, Ser 4.09  0.50 - 1.35 (mg/dL)    Calcium 8.2 (*) 8.4 - 10.5 (mg/dL)    GFR calc non Af Amer 80 (*) >90 (mL/min)    GFR calc Af Amer >90   >90 (mL/min)   BASIC METABOLIC PANEL     Status: Abnormal   Collection Time   01/16/12  5:09 AM      Component Value Range Comment   Sodium 124 (*) 135 - 145 (mEq/L)    Potassium 4.0  3.5 - 5.1 (mEq/L)    Chloride 91 (*) 96 - 112 (mEq/L)    CO2 24  19 - 32 (mEq/L)    Glucose, Bld 90  70 - 99 (mg/dL)    BUN 22  6 - 23 (mg/dL)    Creatinine, Ser 8.11  0.50 - 1.35 (mg/dL)    Calcium 8.4  8.4 - 10.5 (mg/dL)    GFR calc non Af Amer 78 (*) >90 (mL/min)    GFR calc Af Amer >90  >90 (mL/min)         Assessment/Plan: 1. Functional deficits secondary to Paraparesis due to epidural hematoma L2,L3 compression which require 3+ hours per day of interdisciplinary therapy in a comprehensive inpatient rehab setting. Physiatrist is providing close team supervision and 24 hour management of active medical problems listed below. Physiatrist and rehab team continue to assess barriers to discharge/monitor patient progress toward functional and medical goals. FIM: FIM - Bathing Bathing Steps Patient Completed: Chest;Right Arm;Left Arm;Abdomen;Front perineal area;Right upper leg;Left upper leg Bathing: 3: Mod-Patient completes 5-7 75f 10 parts  or 50-74%  FIM - Upper Body Dressing/Undressing Upper body dressing/undressing steps patient completed: Thread/unthread right sleeve of pullover shirt/dresss;Thread/unthread left sleeve of pullover shirt/dress;Put head through opening of pull over shirt/dress;Pull shirt over trunk Upper body dressing/undressing: 5: Set-up assist to: Obtain clothing/put away FIM - Lower Body Dressing/Undressing Lower body dressing/undressing steps patient completed: Pull underwear up/down Lower body dressing/undressing: 1: Total-Patient completed less than 25% of tasks  FIM - Toileting Toileting steps completed by patient: Performs perineal hygiene Toileting: 2: Max-Patient completed 1 of 3 steps  FIM - Diplomatic Services operational officer Devices: Elevated toilet  seat;Walker;Grab bars Toilet Transfers: 3-To toilet/BSC: Mod A (lift or lower assist);3-From toilet/BSC: Mod A (lift or lower assist)  FIM - Bed/Chair Transfer Bed/Chair Transfer Assistive Devices: Bed rails Bed/Chair Transfer: 3: Supine > Sit: Mod A (lifting assist/Pt. 50-74%/lift 2 legs;1: Sit > Supine: Total A (helper does all/Pt. < 25%);3: Bed > Chair or W/C: Mod A (lift or lower assist);1: Two helpers  FIM - Locomotion: Wheelchair Locomotion: Wheelchair: 1: Total Assistance/staff pushes wheelchair (Pt<25%) FIM - Locomotion: Ambulation Locomotion: Ambulation Assistive Devices: Designer, industrial/product Ambulation/Gait Assistance: 3: Mod assist Locomotion: Ambulation: 0: Activity did not occur  Comprehension Comprehension Mode: Auditory Comprehension: 3-Understands basic 50 - 74% of the time/requires cueing 25 - 50%  of the time  Expression Expression Mode: Verbal Expression: 3-Expresses basic 50 - 74% of the time/requires cueing 25 - 50% of the time. Needs to repeat parts of sentences.  Social Interaction Social Interaction: 2-Interacts appropriately 25 - 49% of time - Needs frequent redirection.  Problem Solving Problem Solving: 1-Solves basic less than 25% of the time - needs direction nearly all the time or does not effectively solve problems and may need a restraint for safety  Memory Memory: 1-Recognizes or recalls less than 25% of the time/requires cueing greater than 75% of the time  2. Anticoagulation/DVT prophylaxis with Pharmaceutical: Heparin  Medical Problem List and Plan:  1. lumbar epidural hematoma with paraparesis. 4 level laminectomy and evacuation of lumbar L2-3 epidural hematoma 01/05/2012  2. DVT Prophylaxis/Anticoagulation: Subcutaneous heparin. Monitor platelet counts and any signs of bleeding  3. Pain Management: Oxycodone 5 mg every 4-6 hours as needed change to tramadol and monitor 4. abdominal aortic aneurysm/thoraco abdominal aneurysm. Endovascularly  repaired 01/01/2012. Will need followup at [facility]  5. Chronic atrial fibrillation. Cardiac rate control. Continue atenolol 50 mg daily. Coumadin and aspirin remain on hold for one month from date of surgery 01/05/2012 due to epidural hematoma  6. Urinary retention. Discontinue Foley catheter tube. Continue Flomax. Check PVRs x3  7. Congestive heart failure. Lasix held for low Na, cont losartan Monitor for any signs of fluid overload  8. Hyponatremia. Latest sodium level 124 hold lasix. Recheck BMET in 3-4 days 9. COPD. Continue Spiriva and Flonase.  10. Hypertension. Continue losartan 100 mg daily. Monitor for any signs of orthostasis  11.  Somnolence improved but still confused  suspect pain meds off oxy on tramadol, monitor 11. Hyperlipidemia.atorvastatin    LOS (Days) 5 A FACE TO FACE EVALUATION WAS PERFORMED  Margarine Grosshans E 01/16/2012, 7:15 AM

## 2012-01-16 NOTE — Progress Notes (Signed)
Physical Therapy Note  Patient Details  Name: Ryan Hess MRN: [id number] Date of Birth: [birth year] Today's Date: 01/16/2012  NT had told this PT at 1230 that son requesting pt be permitted to sleep if he was still sleepy and not do pm PT. PT did attempt to see pt at 1345 and pt opened eyes briefly and PT oriented him to place and time but then pt closed eyes again, unable to stay aroused to particiapte, nurse informed. . Missed 45 min PT  Michaelene Song 01/16/2012, 1:43 PM

## 2012-01-16 NOTE — Progress Notes (Signed)
Occupational Therapy Session Note  Patient Details  Name: Ryan Hess MRN: [id number] Date of Birth: Dec 04, 1923  Today's Date: 01/16/2012 Time: 0903-1003 Time Calculation (min): 60 min  Second Session Time:  11:30-11:40 Time Calculation (min): 10 min  Short Term Goals: Week 1:  OT Short Term Goal 1 (Week 1): Patient will perform LB dressing with min assist using AE prn OT Short Term Goal 2 (Week 1): Patient will perform toilet transfers with min assist using AD and DME prn OT Short Term Goal 3 (Week 1): Patient will perform tub/shower transfer with min assist using DME prn OT Short Term Goal 4 (Week 1): Patient will perform grooming tasks independently (LTG)  Skilled Therapeutic Interventions/Progress Updates:    Session #1:  Focused on bathing and dressing at the sink sit to stand.  Pt very confused but follows one step commands.  Not oriented to time, place or situation.  Required max assist for transfer to the wheelchair from the bed stand pivot.  Able to perform sit to stand at the sink with mod assist 4 or 5 times but was incontinent of bowel during 2 of those attempts.  Able to use the reacher with max instructional cueing to remove his socks but needed mod assist to use the reacher to attempt donning his brief.  Pt overall needs max step by step instructional cueing to sequence through session.  At times when not engaged in an activity he would sit and close his eyes.    Session#2:  Pt's son present for attempted OT.  Voiced concerns of pt's cognition and level of arousal.  He reports pt was not confused prior to this admission and is not understanding now is father is "out of it".  Therapist only able to arouse pt for a few seconds then he would drift back off to sleep.  When therapist asked pt who was in the room he looked at his son and said it was his brother.  Attempted sit to stand however pt not able to participate and collapsed back in the bedside chair.  Notified PA of pt's  condition and the son's wishes to speak to someone about his father's condition.  Pt overall missed 20 mins of OT session secondary to lethargy and inability to participate. Therapy Documentation Precautions:  Precautions Precautions: Back;Fall Restrictions Weight Bearing Restrictions: No General: General Amount of Missed OT Time (min): 30 Minutes Missed Time Reason: Patient fatigue;Other (comment) (see note; pt unable to maintain alertness)  Pain: Pain Assessment Pain Assessment: Faces Faces Pain Scale: Hurts a little bit Pain Type: Surgical pain Pain Location: Back Pain Intervention(s): Repositioned Multiple Pain Sites: No ADL:  See FIM for current functional status  Therapy/Group: Individual Therapy  Maleigh Bagot 01/16/2012, 12:27 PM

## 2012-01-17 DIAGNOSIS — R339 Retention of urine, unspecified: Secondary | ICD-10-CM

## 2012-01-17 DIAGNOSIS — N390 Urinary tract infection, site not specified: Secondary | ICD-10-CM

## 2012-01-17 DIAGNOSIS — G8220 Paraplegia, unspecified: Secondary | ICD-10-CM

## 2012-01-17 DIAGNOSIS — Z5189 Encounter for other specified aftercare: Secondary | ICD-10-CM

## 2012-01-17 DIAGNOSIS — R4182 Altered mental status, unspecified: Secondary | ICD-10-CM

## 2012-01-17 DIAGNOSIS — M48061 Spinal stenosis, lumbar region without neurogenic claudication: Secondary | ICD-10-CM

## 2012-01-17 LAB — BASIC METABOLIC PANEL
BUN: 21 mg/dL (ref 6–23)
CO2: 26 mEq/L (ref 19–32)
Calcium: 8.7 mg/dL (ref 8.4–10.5)
Chloride: 92 mEq/L — ABNORMAL LOW (ref 96–112)
Creatinine, Ser: 0.87 mg/dL (ref 0.50–1.35)
GFR calc Af Amer: 88 mL/min — ABNORMAL LOW (ref >90)
GFR calc non Af Amer: 76 mL/min — ABNORMAL LOW (ref >90)
Glucose, Bld: 82 mg/dL (ref 70–99)
Potassium: 3.9 mEq/L (ref 3.5–5.1)
Sodium: 126 mEq/L — ABNORMAL LOW (ref 135–145)

## 2012-01-17 LAB — DIFFERENTIAL
Basophils Absolute: 0 10*3/uL (ref 0.0–0.1)
Basophils Relative: 0 % (ref 0–1)
Eosinophils Absolute: 0.1 10*3/uL (ref 0.0–0.7)
Eosinophils Relative: 1 % (ref 0–5)
Lymphocytes Relative: 8 % — ABNORMAL LOW (ref 12–46)
Lymphs Abs: 0.9 10*3/uL (ref 0.7–4.0)
Monocytes Absolute: 0.8 10*3/uL (ref 0.1–1.0)
Monocytes Relative: 7 % (ref 3–12)
Neutro Abs: 10.5 10*3/uL — ABNORMAL HIGH (ref 1.7–7.7)
Neutrophils Relative %: 85 % — ABNORMAL HIGH (ref 43–77)

## 2012-01-17 LAB — CBC
HCT: 35.8 % — ABNORMAL LOW (ref 39.0–52.0)
Hemoglobin: 12.4 g/dL — ABNORMAL LOW (ref 13.0–17.0)
MCH: 31.6 pg (ref 26.0–34.0)
MCHC: 34.6 g/dL (ref 30.0–36.0)
MCV: 91.1 fL (ref 78.0–100.0)
Platelets: 373 10*3/uL (ref 150–400)
RBC: 3.93 MIL/uL — ABNORMAL LOW (ref 4.22–5.81)
RDW: 15 % (ref 11.5–15.5)
WBC: 12.4 10*3/uL — ABNORMAL HIGH (ref 4.0–10.5)

## 2012-01-17 MED ORDER — CIPROFLOXACIN HCL 500 MG PO TABS
500.0000 mg | ORAL_TABLET | Freq: Two times a day (BID) | ORAL | Status: AC
  Administered 2012-01-17 – 2012-01-24 (×14): 500 mg via ORAL
  Filled 2012-01-17 (×14): qty 1

## 2012-01-17 NOTE — Progress Notes (Signed)
Notified Deatra Ina, PA was notified of nurse and respiratory therapist attempts to obtain O2 sats. Advise to keep checking patient and assist with keeping nasal cannula in place. Will continue to monitor.

## 2012-01-17 NOTE — Patient Care Conference (Signed)
Inpatient RehabilitationTeam Conference Note Date: 01/16/2012   Time: 2:22 PM    Patient Name: Ryan Hess      Medical Record Number: [id number]  Date of Birth: [birth year] Sex: Male         Room/Bed: 4002/4002-02 Payor Info: Payor: [employer] [place] [employer]  Plan: BLUE MEDICARE  Product Type: *No Product type*     Admitting Diagnosis: Lami from hematoma evac/paraparesis  Admit Date/Time:  01/11/2012  2:14 PM Admission Comments: No comment available   Primary Diagnosis:  Epidural hematoma Principal Problem: Epidural hematoma  Patient Active Problem List  Diagnoses Date Noted  . Epidural hematoma 01/11/2012  . OTHER DYSPHAGIA 07/15/2010  . PULMONARY NODULE 03/15/2010  . PNEUMONIA, ORGANISM UNSPECIFIED 02/03/2010  . CONGESTIVE HEART FAILURE 11/16/2009  . ATRIAL FIBRILLATION 11/08/2009  . WEAKNESS 10/25/2009  . DYSPNEA 10/25/2009  . THORACIC AORTIC ANEURYSM 02/10/2009  . PREMATURE VENTRICULAR CONTRACTIONS, FREQUENT 03/17/2008  . PROSTATE CANCER 02/12/2008  . COLONIC POLYPS 02/12/2008  . PERIPHERAL VASCULAR DISEASE 02/12/2008  . COPD 02/12/2008  . DIVERTICULOSIS OF COLON 02/12/2008  . ACTINIC SKIN DAMAGE 02/12/2008  . HYPERCHOLESTEROLEMIA 08/19/2007  . HYPERTENSION 08/19/2007  . DEGENERATIVE JOINT DISEASE 08/19/2007    Expected Discharge Date: Expected Discharge Date: 01/30/12 (possible SNF d/c)  Team Members Present: Physician: Dr. Claudette Laws Case Manager Present: Melanee Spry, RN Social Worker Present: [name], LCSW Nurse Present: Daryll Brod, RN PT Present: Karolee Stamps, PT OT Present: Edwin Cap, Felipa Eth, OT Other (Discipline and Name): Tora Duck, PPS Coordinator     Current Status/Progress Goal Weekly Team Focus  Medical   Paraplegia, confusion, pain  Maintain pain relief while minimizing confusion  Adjust medications   Bowel/Bladder   Incontinent of bowel; requiring I&O cath q8h  Mod Assist      Swallow/Nutrition/  Hydration             ADL's   fluctuates between min-total assist  overall supervision  awareness, overall activity tolerance/endurance,functional mobility with RW   Mobility   min to total A + 2 fluctuating with level of alertness  supervision to min A overall (ambulatory)  increased participation/arousal; functional strengthening; gait and balance with RW   Communication             Safety/Cognition/ Behavioral Observations            Pain   Denies pain         Skin   Dermabond to right groin incision; tegaderm to skin tear right elbow.  keep patient turned q2h; prevent new skin breakdown         *See Interdisciplinary Assessment and Plan and progress notes for long and short-term goals  Barriers to Discharge: Poor functional status as well as confusion    Possible Resolutions to Barriers:  See above, continue therapies    Discharge Planning/Teaching Needs:  home with wife - son, initially available for 24/7 but this is time limited.      Team Discussion: Txs limited by pt's lethargy & confusion.  All meds w/ mental dampening effects d/c'd.  Total assist bowel/bladder management.    Revisions to Treatment Plan: none    Continued Need for Acute Rehabilitation Level of Care: The patient requires daily medical management by a physician with specialized training in physical medicine and rehabilitation for the following conditions: Daily direction of a multidisciplinary physical rehabilitation program to ensure safe treatment while eliciting the highest outcome that is of practical value to the patient.:  Yes Daily medical management of patient stability for increased activity during participation in an intensive rehabilitation regime.: Yes Daily analysis of laboratory values and/or radiology reports with any subsequent need for medication adjustment of medical intervention for : Post surgical problems;Neurological problems  Ryan Hess 01/17/2012, 2:22 PM

## 2012-01-17 NOTE — Progress Notes (Signed)
Patient taking oxygen through nasal cannula off, frequent checks made by nurse to assist patient with placing O2 per nasal cannula on.  Attempts made to check O2 sats not able to get a constant reading. Respiratory therapist called to assess. Will continue to monitor.

## 2012-01-17 NOTE — Progress Notes (Signed)
Patient ID: Ryan Hess, male   DOB: 1924/02/25, 78 y.o.   MRN: [id number] Patient ID: Ryan Hess, male   DOB: 01/10/1924, 78 y.o.   MRN: [id number]  Subjective/Complaints: Review of Systems  Unable to perform ROS: mental acuity  still confused. No issues overnight per RN  Objective: Vital Signs: Blood pressure 126/81, pulse 62, temperature 97.5 F (36.4 C), temperature source Axillary, resp. rate 18, height '5\' 9"'$  (1.753 m), weight 83.734 kg (184 lb 9.6 oz), SpO2 99.00%. Dg Chest 2 View  01/16/2012  *RADIOLOGY REPORT*  Clinical Data: Hypoxia.  Altered mental status.  CHEST - 2 VIEW  Comparison: 01/05/2012 thoracic spine plain films.  08/31/2011 CT chest.  05/04/2010 chest x-ray.  Findings: Post endoluminal graft placed within the descending thoracic aorta/upper abdominal aorta.  Kink of this graft.  The angulation at the lower thoracic segment has progressed slightly since prior exam.  Cardiomegaly.  Pulmonary vascular prominence.  Chronic lung changes medial aspect lung bases.  Superimposed infiltrate, particularly within the right lower lobe not excluded.  Mild nodularity right upper lobe and mid to lower left lower lobe can be assessed on close follow-up chest x-ray examination or CT.  IMPRESSION: Post endoluminal graft placed within the descending thoracic aorta/upper abdominal aorta.  Kink of this graft.  The angulation at the lower thoracic segment has progressed slightly since prior exam.  Cardiomegaly.  Pulmonary vascular prominence.  Chronic lung changes medial aspect lung bases.  Superimposed infiltrate, particularly within the right lower lobe not excluded.  Mild nodularity right upper lobe and mid to lower left lower lobe can be assessed on close follow-up chest x-ray examination or CT.  Original Report Authenticated By: Doug Sou, M.D.   Results for orders placed during the hospital encounter of 01/11/12 (from the past 72 hour(s))  BASIC METABOLIC PANEL     Status: Abnormal     Collection Time   01/15/12  6:16 AM      Component Value Range Comment   Sodium 121 (*) 135 - 145 (mEq/L)    Potassium 4.7  3.5 - 5.1 (mEq/L) HEMOLYSIS AT THIS LEVEL MAY AFFECT RESULT   Chloride 89 (*) 96 - 112 (mEq/L)    CO2 21  19 - 32 (mEq/L)    Glucose, Bld 97  70 - 99 (mg/dL)    BUN 19  6 - 23 (mg/dL)    Creatinine, Ser 0.76  0.50 - 1.35 (mg/dL)    Calcium 8.2 (*) 8.4 - 10.5 (mg/dL)    GFR calc non Af Amer 80 (*) >90 (mL/min)    GFR calc Af Amer >90  >90 (mL/min)   BASIC METABOLIC PANEL     Status: Abnormal   Collection Time   01/16/12  5:09 AM      Component Value Range Comment   Sodium 124 (*) 135 - 145 (mEq/L)    Potassium 4.0  3.5 - 5.1 (mEq/L)    Chloride 91 (*) 96 - 112 (mEq/L)    CO2 24  19 - 32 (mEq/L)    Glucose, Bld 90  70 - 99 (mg/dL)    BUN 22  6 - 23 (mg/dL)    Creatinine, Ser 0.82  0.50 - 1.35 (mg/dL)    Calcium 8.4  8.4 - 10.5 (mg/dL)    GFR calc non Af Amer 78 (*) >90 (mL/min)    GFR calc Af Amer >90  >90 (mL/min)   URINALYSIS, ROUTINE W REFLEX MICROSCOPIC     Status: Abnormal  Collection Time   01/16/12  4:26 PM      Component Value Range Comment   Color, Urine AMBER (*) YELLOW  BIOCHEMICALS MAY BE AFFECTED BY COLOR   APPearance HAZY (*) CLEAR     Specific Gravity, Urine 1.028  1.005 - 1.030     pH 5.5  5.0 - 8.0     Glucose, UA NEGATIVE  NEGATIVE (mg/dL)    Hgb urine dipstick NEGATIVE  NEGATIVE     Bilirubin Urine SMALL (*) NEGATIVE     Ketones, ur NEGATIVE  NEGATIVE (mg/dL)    Protein, ur NEGATIVE  NEGATIVE (mg/dL)    Urobilinogen, UA 4.0 (*) 0.0 - 1.0 (mg/dL)    Nitrite NEGATIVE  NEGATIVE     Leukocytes, UA SMALL (*) NEGATIVE    URINE MICROSCOPIC-ADD ON     Status: Abnormal   Collection Time   01/16/12  4:26 PM      Component Value Range Comment   Squamous Epithelial / LPF RARE  RARE     WBC, UA 3-6  <3 (WBC/hpf)    Bacteria, UA FEW (*) RARE     Casts HYALINE CASTS (*) NEGATIVE     Urine-Other MUCOUS PRESENT     CBC     Status: Abnormal    Collection Time   01/17/12  6:05 AM      Component Value Range Comment   WBC 12.4 (*) 4.0 - 10.5 (K/uL)    RBC 3.93 (*) 4.22 - 5.81 (MIL/uL)    Hemoglobin 12.4 (*) 13.0 - 17.0 (g/dL)    HCT 35.8 (*) 39.0 - 52.0 (%)    MCV 91.1  78.0 - 100.0 (fL)    MCH 31.6  26.0 - 34.0 (pg)    MCHC 34.6  30.0 - 36.0 (g/dL)    RDW 15.0  11.5 - 15.5 (%)    Platelets 373  150 - 400 (K/uL)   DIFFERENTIAL     Status: Abnormal   Collection Time   01/17/12  6:05 AM      Component Value Range Comment   Neutrophils Relative 85 (*) 43 - 77 (%)    Neutro Abs 10.5 (*) 1.7 - 7.7 (K/uL)    Lymphocytes Relative 8 (*) 12 - 46 (%)    Lymphs Abs 0.9  0.7 - 4.0 (K/uL)    Monocytes Relative 7  3 - 12 (%)    Monocytes Absolute 0.8  0.1 - 1.0 (K/uL)    Eosinophils Relative 1  0 - 5 (%)    Eosinophils Absolute 0.1  0.0 - 0.7 (K/uL)    Basophils Relative 0  0 - 1 (%)    Basophils Absolute 0.0  0.0 - 0.1 (K/uL)   BASIC METABOLIC PANEL     Status: Abnormal   Collection Time   01/17/12  6:05 AM      Component Value Range Comment   Sodium 126 (*) 135 - 145 (mEq/L)    Potassium 3.9  3.5 - 5.1 (mEq/L)    Chloride 92 (*) 96 - 112 (mEq/L)    CO2 26  19 - 32 (mEq/L)    Glucose, Bld 82  70 - 99 (mg/dL)    BUN 21  6 - 23 (mg/dL)    Creatinine, Ser 0.87  0.50 - 1.35 (mg/dL)    Calcium 8.7  8.4 - 10.5 (mg/dL)    GFR calc non Af Amer 76 (*) >90 (mL/min)    GFR calc Af Amer 88 (*) >90 (mL/min)  Assessment/Plan: 1. Functional deficits secondary to Paraparesis due to epidural hematoma L2,L3 compression which require 3+ hours per day of interdisciplinary therapy in a comprehensive inpatient rehab setting. Physiatrist is providing close team supervision and 24 hour management of active medical problems listed below. Physiatrist and rehab team continue to assess barriers to discharge/monitor patient progress toward functional and medical goals. FIM: FIM - Bathing Bathing Steps Patient Completed: Chest;Right Arm;Left  Arm;Abdomen;Front perineal area;Right upper leg;Left upper leg Bathing: 3: Mod-Patient completes 5-7 [id number] parts or 50-74%  FIM - Upper Body Dressing/Undressing Upper body dressing/undressing steps patient completed: Pull shirt over trunk;Thread/unthread right sleeve of pullover shirt/dresss;Put head through opening of pull over shirt/dress;Thread/unthread left sleeve of pullover shirt/dress Upper body dressing/undressing: 5: Supervision: Safety issues/verbal cues FIM - Lower Body Dressing/Undressing Lower body dressing/undressing steps patient completed: Pull underwear up/down Lower body dressing/undressing: 1: Total-Patient completed less than 25% of tasks  FIM - Toileting Toileting steps completed by patient: Performs perineal hygiene Toileting: 2: Max-Patient completed 1 of 3 steps  FIM - TRadio producerDevices: Elevated toilet seat;Walker;Grab bars Toilet Transfers: 3-To toilet/BSC: Mod A (lift or lower assist);3-From toilet/BSC: Mod A (lift or lower assist)  FIM - Bed/Chair Transfer Bed/Chair Transfer Assistive Devices: Bed rails Bed/Chair Transfer: 3: Supine > Sit: Mod A (lifting assist/Pt. 50-74%/lift 2 legs;2: Bed > Chair or W/C: Max A (lift and lower assist)  FIM - Locomotion: Wheelchair Locomotion: Wheelchair: 1: Total Assistance/staff pushes wheelchair (Pt<25%) FIM - Locomotion: Ambulation Locomotion: Ambulation Assistive Devices: WAdministratorAmbulation/Gait Assistance: 3: Mod assist Locomotion: Ambulation: 1: Two helpers (pt mod A)  Comprehension Comprehension Mode: Auditory Comprehension: 4-Understands basic 75 - 89% of the time/requires cueing 10 - 24% of the time  Expression Expression Mode: Verbal Expression: 3-Expresses basic 50 - 74% of the time/requires cueing 25 - 50% of the time. Needs to repeat parts of sentences.  Social Interaction Social Interaction Mode: Asleep Social Interaction: 2-Interacts appropriately 25 - 49% of  time - Needs frequent redirection.  Problem Solving Problem Solving: 2-Solves basic 25 - 49% of the time - needs direction more than half the time to initiate, plan or complete simple activities  Memory Memory: 2-Recognizes or recalls 25 - 49% of the time/requires cueing 51 - 75% of the time  2. Anticoagulation/DVT prophylaxis with Pharmaceutical: Heparin  Medical Problem List and Plan:  1. lumbar epidural hematoma with paraparesis. 4 level laminectomy and evacuation of lumbar L2-3 epidural hematoma 01/05/2012  2. DVT Prophylaxis/Anticoagulation: Subcutaneous heparin. Monitor platelet counts and any signs of bleeding  3. Pain Management: will use tylenol due to AMS.   4. abdominal aortic aneurysm/thoraco abdominal aneurysm. Endovascularly repaired 01/01/2012. Will need followup at [facility] 5. Chronic atrial fibrillation. Cardiac rate control. Continue atenolol 50 mg daily. Coumadin and aspirin remain on hold for one month from date of surgery 01/05/2012 due to epidural hematoma  6. Urinary retention. Requiring occasional I/O cath. ucx pending. 7. Congestive heart failure. Lasix held for low Na, cont losartan Monitor for any signs of fluid overload  8. Hyponatremia. 126 today. Lasix held. Trending up 9. COPD. Continue Spiriva and Flonase.  10. Hypertension: Continue losartan 100 mg daily. Monitor for any signs of orthostasis  11.  Somnolence/confusion: still confused  -ua negative to equivocal.  -dc all neurosedating meds  -will discuss with team 11. Hyperlipidemia.atorvastatin    LOS (Days) 6 A FACE TO FACE EVALUATION WAS PERFORMED  Ryan Hess 01/17/2012, 7:52 AM

## 2012-01-17 NOTE — Progress Notes (Signed)
Physical Therapy Session Note  Patient Details  Name: Ryan Hess MRN: [id number] Date of Birth: July 06, 1924  Today's Date: 01/17/2012 Time: 1300-1400 Time Calculation (min): 60 min  Short Term Goals: Week 1:  PT Short Term Goal 1 (Week 1): Pt will be able to transfer min A with RW PT Short Term Goal 2 (Week 1): Pt will be able to demonstrate dynamic sitting balance with supervision PT Short Term Goal 3 (Week 1): Pt will be able to do curb step with RW to simulate home entry with mod A  Skilled Therapeutic Interventions/Progress Updates: Pt continued to be disoriented and confused during session with tendency to perseverate on things (pancakes flying in the sky, and with transfer attempt end of session pt perseverating on pushing the w/c away with his hand) . W/c propulsion with lower extremities with max A for coordination, attention to task,and LE movement/strengthening to gym. Transfers required from max A to +2 assist up to +3 assist at end when fatigued. Nustep for functional strengthening x 10 minutes on level 4; pt seemed to enjoy this activity with intermittent cueing needed to continue task and redirect focus from something else going on in the room. +3 assist needed with transfer from Nustep back to w/c and pt stuck on aspect of w/c making it difficult to coordinate transfer/follow commands. Returned to bed to rest as pt was continuing to decrease in alertness and starting to keep his eyes closed. Son and wife present to observe; son assisting physically with transfers and attempts to motivate pt.  Therapy Documentation Precautions:  Precautions Precautions: Back;Fall Restrictions Weight Bearing Restrictions: No   Pain:  complaint of back pain when attempts for standing initially but then did not complain rest of session.  See FIM for current functional status  Therapy/Group: Individual Therapy  Karolee Stamps [facility] 01/17/2012, 2:57 PM

## 2012-01-17 NOTE — Care Management Note (Signed)
Per State Regulation 482.30 This chart was reviewed for medical necessity with respect to the patient's Admission/Duration of stay. Pt not able to participate well or progress d/t his lethargy & confusion.  Labs & tests being done.  Son being updated.  Pt somewhat more awake today, but exhibiting much confusion.  He has been moved closer to nurses station for ease of observation.   Brock Ra                 Nurse Care Manager              Next Review Date: 01/19/12

## 2012-01-17 NOTE — Progress Notes (Signed)
Occupational Therapy Session Note  Patient Details  Name: Ryan Hess MRN: [id number] Date of Birth: May 30, 1924  Today's Date: 01/17/2012 Time: 7829-5621 Time Calculation (min): 56 min  Short Term Goals: Week 1:  OT Short Term Goal 1 (Week 1): Patient will perform LB dressing with min assist using AE prn OT Short Term Goal 2 (Week 1): Patient will perform toilet transfers with min assist using AD and DME prn OT Short Term Goal 3 (Week 1): Patient will perform tub/shower transfer with min assist using DME prn OT Short Term Goal 4 (Week 1): Patient will perform grooming tasks independently (LTG)  Skilled Therapeutic Interventions/Progress Updates:    Pt in bed eating breakfast but agreeable to get dressed although pt stated that he really didn't need to wash up.  Pt required mod A for bed to w/c transfer.  At sink pt washed face when presented with wash cloth and then stated that was all he needed to do.  Encouraged pt to stand up to bathe buttocks.  While standing pt incontinent of bladder with no awareness.  Amb with r/w to bathroom to use toilet with no success.  Pt required total A for LB dressing with no attempt to initiate task.  Pt initiated brushing teeth at sink with some perseveration noted.  Pt transferred to recliner with stand pivot transfer.  Pt required max verbal cues for initiation throughout session.  O2 sats >94% on RA throughout session.  Therapy Documentation Precautions:  Precautions Precautions: Back;Fall Restrictions Weight Bearing Restrictions: No General:   Pain: Pain Assessment Pain Assessment: No/denies pain  See FIM for current functional status  Therapy/Group: Individual Therapy  Rich Brave 01/17/2012, 9:42 AM

## 2012-01-17 NOTE — Progress Notes (Signed)
Occupational Therapy Note  Patient Details  Name: Ryan Hess MRN: [id number] Date of Birth: 1923-11-09 Today's Date: 01/17/2012  Pt missed 45 mins skilled OT services.  Pt had just returned to bed after previous therapy.  Son and wife at bedside.  Son stated that patient was "worn out."  Therapist attempted to engage pt to actively participate.  Pt not responding to 1 step commands.  Son inquired about progress and his concern on questionable cognitive status.  Referred son to speak with medical staff.  Pt's son stated that he was in ongoing communication with MD.   Rich Brave 01/17/2012, 2:45 PM

## 2012-01-17 NOTE — Progress Notes (Signed)
Physical Therapy Session Note  Patient Details  Name: Ryan Hess MRN: [id number] Date of Birth: 1924/04/02  Today's Date: 01/17/2012 Time: 1130-1200 Time Calculation (min): 30 min  Short Term Goals: Week 1:  PT Short Term Goal 1 (Week 1): Pt will be able to transfer min A with RW PT Short Term Goal 2 (Week 1): Pt will be able to demonstrate dynamic sitting balance with supervision PT Short Term Goal 3 (Week 1): Pt will be able to do curb step with RW to simulate home entry with mod A  Skilled Therapeutic Interventions/Progress Updates: Pt more alert today but with increased confusion noted. Pt grabbing at things in the air and disoriented to place/situation and tasks at hand. Total A + 2 for sit to stand from recliner; gait with RW with + 3 (2 person assist with gait and third person with O2 tank) with cueing for posture, assist with placement of RW for safety and cueing for technique x 40'. Therapist and pt's son with assist for sit to stand from mat and min A for stand step back into recliner. Pt continued to take O2 off during session; busy belt applied at end of session and family present.     Therapy Documentation Precautions:  Precautions Precautions: Back;Fall Restrictions Weight Bearing Restrictions: No  Pain: Unable to rate pain; did not appear in distress.  See FIM for current functional status  Therapy/Group: Individual Therapy  Karolee Stamps [facility] 01/17/2012, 12:19 PM

## 2012-01-18 LAB — BASIC METABOLIC PANEL
BUN: 20 mg/dL (ref 6–23)
CO2: 23 mEq/L (ref 19–32)
Calcium: 8.6 mg/dL (ref 8.4–10.5)
Chloride: 92 mEq/L — ABNORMAL LOW (ref 96–112)
Creatinine, Ser: 0.89 mg/dL (ref 0.50–1.35)
GFR calc Af Amer: 87 mL/min — ABNORMAL LOW (ref >90)
GFR calc non Af Amer: 75 mL/min — ABNORMAL LOW (ref >90)
Glucose, Bld: 88 mg/dL (ref 70–99)
Potassium: 4.3 mEq/L (ref 3.5–5.1)
Sodium: 124 mEq/L — ABNORMAL LOW (ref 135–145)

## 2012-01-18 NOTE — Progress Notes (Addendum)
Patient ID: KOLETON DUCHEMIN, male   DOB: April 17, 1924, 78 y.o.   MRN: [id number] Patient ID: CHRYSTOPHER STANGL, male   DOB: December 13, 1923, 77 y.o.   MRN: [id number] Patient ID: BION TODOROV, male   DOB: 1924-06-29, 78 y.o.   MRN: [id number]  Subjective/Complaints: Review of Systems  Unable to perform ROS: mental acuity  still some confusion. Slow to arouse this am.  Objective: Vital Signs: Blood pressure 141/84, pulse 100, temperature 98.5 F (36.9 C), temperature source Axillary, resp. rate 18, height 5\' 9"  (1.753 m), weight 83.734 kg (184 lb 9.6 oz), SpO2 96.00%. Dg Chest 2 View  01/16/2012  *RADIOLOGY REPORT*  Clinical Data: Hypoxia.  Altered mental status.  CHEST - 2 VIEW  Comparison: 01/05/2012 thoracic spine plain films.  08/31/2011 CT chest.  05/04/2010 chest x-ray.  Findings: Post endoluminal graft placed within the descending thoracic aorta/upper abdominal aorta.  Kink of this graft.  The angulation at the lower thoracic segment has progressed slightly since prior exam.  Cardiomegaly.  Pulmonary vascular prominence.  Chronic lung changes medial aspect lung bases.  Superimposed infiltrate, particularly within the right lower lobe not excluded.  Mild nodularity right upper lobe and mid to lower left lower lobe can be assessed on close follow-up chest x-ray examination or CT.  IMPRESSION: Post endoluminal graft placed within the descending thoracic aorta/upper abdominal aorta.  Kink of this graft.  The angulation at the lower thoracic segment has progressed slightly since prior exam.  Cardiomegaly.  Pulmonary vascular prominence.  Chronic lung changes medial aspect lung bases.  Superimposed infiltrate, particularly within the right lower lobe not excluded.  Mild nodularity right upper lobe and mid to lower left lower lobe can be assessed on close follow-up chest x-ray examination or CT.  Original Report Authenticated By: Fuller Canada, M.D.   Results for orders placed during the hospital encounter  of 01/11/12 (from the past 72 hour(s))  BASIC METABOLIC PANEL     Status: Abnormal   Collection Time   01/16/12  5:09 AM      Component Value Range Comment   Sodium 124 (*) 135 - 145 (mEq/L)    Potassium 4.0  3.5 - 5.1 (mEq/L)    Chloride 91 (*) 96 - 112 (mEq/L)    CO2 24  19 - 32 (mEq/L)    Glucose, Bld 90  70 - 99 (mg/dL)    BUN 22  6 - 23 (mg/dL)    Creatinine, Ser 0.86  0.50 - 1.35 (mg/dL)    Calcium 8.4  8.4 - 10.5 (mg/dL)    GFR calc non Af Amer 78 (*) >90 (mL/min)    GFR calc Af Amer >90  >90 (mL/min)   URINALYSIS, ROUTINE W REFLEX MICROSCOPIC     Status: Abnormal   Collection Time   01/16/12  4:26 PM      Component Value Range Comment   Color, Urine AMBER (*) YELLOW  BIOCHEMICALS MAY BE AFFECTED BY COLOR   APPearance HAZY (*) CLEAR     Specific Gravity, Urine 1.028  1.005 - 1.030     pH 5.5  5.0 - 8.0     Glucose, UA NEGATIVE  NEGATIVE (mg/dL)    Hgb urine dipstick NEGATIVE  NEGATIVE     Bilirubin Urine SMALL (*) NEGATIVE     Ketones, ur NEGATIVE  NEGATIVE (mg/dL)    Protein, ur NEGATIVE  NEGATIVE (mg/dL)    Urobilinogen, UA 4.0 (*) 0.0 - 1.0 (mg/dL)    Nitrite NEGATIVE  NEGATIVE     Leukocytes, UA SMALL (*) NEGATIVE    URINE CULTURE     Status: Normal (Preliminary result)   Collection Time   01/16/12  4:26 PM      Component Value Range Comment   Specimen Description URINE, CATHETERIZED      Special Requests NONE      Culture  Setup Time [id number]      Colony Count 80,000 COLONIES/ML      Culture GRAM NEGATIVE RODS      Report Status PENDING     URINE MICROSCOPIC-ADD ON     Status: Abnormal   Collection Time   01/16/12  4:26 PM      Component Value Range Comment   Squamous Epithelial / LPF RARE  RARE     WBC, UA 3-6  <3 (WBC/hpf)    Bacteria, UA FEW (*) RARE     Casts HYALINE CASTS (*) NEGATIVE     Urine-Other MUCOUS PRESENT     CBC     Status: Abnormal   Collection Time   01/17/12  6:05 AM      Component Value Range Comment   WBC 12.4 (*) 4.0 - 10.5 (K/uL)     RBC 3.93 (*) 4.22 - 5.81 (MIL/uL)    Hemoglobin 12.4 (*) 13.0 - 17.0 (g/dL)    HCT 81.1 (*) 91.4 - 52.0 (%)    MCV 91.1  78.0 - 100.0 (fL)    MCH 31.6  26.0 - 34.0 (pg)    MCHC 34.6  30.0 - 36.0 (g/dL)    RDW 78.2  95.6 - 21.3 (%)    Platelets 373  150 - 400 (K/uL)   DIFFERENTIAL     Status: Abnormal   Collection Time   01/17/12  6:05 AM      Component Value Range Comment   Neutrophils Relative 85 (*) 43 - 77 (%)    Neutro Abs 10.5 (*) 1.7 - 7.7 (K/uL)    Lymphocytes Relative 8 (*) 12 - 46 (%)    Lymphs Abs 0.9  0.7 - 4.0 (K/uL)    Monocytes Relative 7  3 - 12 (%)    Monocytes Absolute 0.8  0.1 - 1.0 (K/uL)    Eosinophils Relative 1  0 - 5 (%)    Eosinophils Absolute 0.1  0.0 - 0.7 (K/uL)    Basophils Relative 0  0 - 1 (%)    Basophils Absolute 0.0  0.0 - 0.1 (K/uL)   BASIC METABOLIC PANEL     Status: Abnormal   Collection Time   01/17/12  6:05 AM      Component Value Range Comment   Sodium 126 (*) 135 - 145 (mEq/L)    Potassium 3.9  3.5 - 5.1 (mEq/L)    Chloride 92 (*) 96 - 112 (mEq/L)    CO2 26  19 - 32 (mEq/L)    Glucose, Bld 82  70 - 99 (mg/dL)    BUN 21  6 - 23 (mg/dL)    Creatinine, Ser 0.86  0.50 - 1.35 (mg/dL)    Calcium 8.7  8.4 - 10.5 (mg/dL)    GFR calc non Af Amer 76 (*) >90 (mL/min)    GFR calc Af Amer 88 (*) >90 (mL/min)   BASIC METABOLIC PANEL     Status: Abnormal   Collection Time   01/18/12  6:25 AM      Component Value Range Comment   Sodium 124 (*) 135 - 145 (mEq/L)  Potassium 4.3  3.5 - 5.1 (mEq/L)    Chloride 92 (*) 96 - 112 (mEq/L)    CO2 23  19 - 32 (mEq/L)    Glucose, Bld 88  70 - 99 (mg/dL)    BUN 20  6 - 23 (mg/dL)    Creatinine, Ser 1.61  0.50 - 1.35 (mg/dL)    Calcium 8.6  8.4 - 10.5 (mg/dL)    GFR calc non Af Amer 75 (*) >90 (mL/min)    GFR calc Af Amer 87 (*) >90 (mL/min)         Assessment/Plan: 1. Functional deficits secondary to Paraparesis due to epidural hematoma L2,L3 compression which require 3+ hours per day of  interdisciplinary therapy in a comprehensive inpatient rehab setting. Physiatrist is providing close team supervision and 24 hour management of active medical problems listed below. Physiatrist and rehab team continue to assess barriers to discharge/monitor patient progress toward functional and medical goals. FIM: FIM - Bathing Bathing Steps Patient Completed: Chest;Right Arm;Left Arm;Abdomen;Front perineal area;Buttocks;Right upper leg;Left upper leg Bathing: 4: Min-Patient completes 8-9 19f 10 parts or 75+ percent  FIM - Upper Body Dressing/Undressing Upper body dressing/undressing steps patient completed: Thread/unthread right sleeve of pullover shirt/dresss;Put head through opening of pull over shirt/dress;Thread/unthread left sleeve of pullover shirt/dress;Pull shirt over trunk Upper body dressing/undressing: 5: Supervision: Safety issues/verbal cues FIM - Lower Body Dressing/Undressing Lower body dressing/undressing steps patient completed: Pull pants up/down Lower body dressing/undressing: 1: Total-Patient completed less than 25% of tasks  FIM - Toileting Toileting steps completed by patient: Performs perineal hygiene Toileting: 2: Max-Patient completed 1 of 3 steps  FIM - Diplomatic Services operational officer Devices: Art gallery manager Transfers: 4-To toilet/BSC: Min A (steadying Pt. > 75%);4-From toilet/BSC: Min A (steadying Pt. > 75%)  FIM - Bed/Chair Transfer Bed/Chair Transfer Assistive Devices: Bed rails Bed/Chair Transfer: 2: Chair or W/C > Bed: Max A (lift and lower assist);2: Sit > Supine: Max A (lifting assist/Pt. 25-49%)  FIM - Locomotion: Wheelchair Locomotion: Wheelchair: 2: Travels 50 - 149 ft with maximal assistance (Pt: 25 - 49%) FIM - Locomotion: Ambulation Locomotion: Ambulation Assistive Devices: Designer, industrial/product Ambulation/Gait Assistance: 3: Mod assist Locomotion: Ambulation: 1: Two helpers  Comprehension Comprehension Mode: Auditory Comprehension:  2-Understands basic 25 - 49% of the time/requires cueing 51 - 75% of the time  Expression Expression Mode: Verbal Expression: 2-Expresses basic 25 - 49% of the time/requires cueing 50 - 75% of the time. Uses single words/gestures.  Social Interaction Social Interaction Mode: Asleep Social Interaction: 2-Interacts appropriately 25 - 49% of time - Needs frequent redirection.  Problem Solving Problem Solving: 1-Solves basic less than 25% of the time - needs direction nearly all the time or does not effectively solve problems and may need a restraint for safety  Memory Memory: 1-Recognizes or recalls less than 25% of the time/requires cueing greater than 75% of the time  2. Anticoagulation/DVT prophylaxis with Pharmaceutical: Heparin  Medical Problem List and Plan:  1. lumbar epidural hematoma with paraparesis. 4 level laminectomy and evacuation of lumbar L2-3 epidural hematoma 01/05/2012  2. DVT Prophylaxis/Anticoagulation: Subcutaneous heparin. Monitor platelet counts and any signs of bleeding  3. Pain Management: will use tylenol due to AMS.   4. abdominal aortic aneurysm/thoraco abdominal aneurysm. Endovascularly repaired 01/01/2012. Will need followup at [facility]  5. Chronic atrial fibrillation. Cardiac rate control. Continue atenolol 50 mg daily. Coumadin and aspirin remain on hold for one month from date of surgery 01/05/2012 due to epidural hematoma  6.  Urinary retention. Requiring occasional I/O cath. ucx pending. 7. Congestive heart failure. Lasix held for low Na, cont losartan Monitor for any signs of fluid overload  8. Hyponatremia. Lasix held. Sodium hovering in mid 120's 9. COPD. Continue Spiriva and Flonase.  10. Hypertension: Continue losartan 100 mg daily. Monitor for any signs of orthostasis  11.  Somnolence/confusion: still confused  -likely due to UTI- empiric cipro started last night for 80k GNR  -sodium lower today (124) but close to baseline.   -continue FR 11.  Hyperlipidemia.atorvastatin    LOS (Days) 7 A FACE TO FACE EVALUATION WAS PERFORMED  Meghan Tiemann T 01/18/2012, 7:20 AM

## 2012-01-18 NOTE — Progress Notes (Signed)
Occupational Therapy Session Note  Patient Details  Name: Ryan Hess MRN: [id number] Date of Birth: 1924-01-27  Today's Date: 01/18/2012 Time: 1500-1530 Time Calculation (min): 30 min  Short Term Goals: Week 1:  OT Short Term Goal 1 (Week 1): Patient will perform LB dressing with min assist using AE prn OT Short Term Goal 2 (Week 1): Patient will perform toilet transfers with min assist using AD and DME prn OT Short Term Goal 3 (Week 1): Patient will perform tub/shower transfer with min assist using DME prn OT Short Term Goal 4 (Week 1): Patient will perform grooming tasks independently (LTG)  Skilled Therapeutic Interventions/Progress Updates:  Co-Treatment with physical therapy focusing on bed mobility and edge of bed -> recliner stand pivot transfer with total assist X2. Once in w/c worked on awareness, attention, and self-feeding. Therapist occasionally brought food to mouth for patient. Patient with eyes shut during most of therapy session and with language of confusion throughout session.   Precautions:  Precautions Precautions: Back;Fall Precaution Comments:   Restrictions Weight Bearing Restrictions: No  See FIM for current functional status  Therapy/Group: Co-Treatment with Physical Therapy   Alby Schwabe 01/18/2012, 3:33 PM

## 2012-01-18 NOTE — Progress Notes (Signed)
Physical Therapy Note  Patient Details  Name: Ryan Hess MRN: [id number] Date of Birth: [birth year] Today's Date: 01/18/2012  Pt missed 45 minutes of skilled PT due to pt unable to be aroused to participate. Upon entering room, pt with [place] off and therapist reapplied. Pt making sounds and moving his arms, opened eyes once for a second upon hearing his name but then was back to sleep snoring. NT aware. Bed alarm intact.    Karolee Stamps [facility] 01/18/2012, 11:32 AM

## 2012-01-18 NOTE — Progress Notes (Signed)
Occupational Therapy Session Note  Patient Details  Name: Ryan Hess MRN: [id number] Date of Birth: 06-17-1924  Today's Date: 01/18/2012 Time: 0901-1000 Time Calculation (min): 59 min   Skilled Therapeutic Interventions/Progress Updates:    Pt initially mumbling and reaching out into the air.  Unable to follow any one step commands and having visual hallucinations as well.  Helped pt into sitting with total assist and he became more awake and able to begin following specific commands (ex wash your face).  Able to perform sit to stand with min assist from EOB but only with hands pulling up on walker.  Also performed all bathing with step by step cueing sitting EOB, except washing below his knees secondary to back precautions.  Pt still not oriented to place, time, or situation.  Unable to utilize AE for LB selfcare secondary to confusion. Also was not able to get O2 sat reading, with multiple attempts throughout session.  Returned back to bed at end of session.  Therapy Documentation Precautions:  Precautions Precautions: Back;Fall Precaution Comments:   Restrictions Weight Bearing Restrictions: No General: General Missed Time Reason: Patient fatigue;Other (comment) (unable to arouse pt)  Pain: Pain Assessment Pain Assessment: 0-10 Pain Score:   3 Pain Type: Surgical pain Pain Location: Back  See FIM for current functional status  Therapy/Group: Individual Therapy  Shaylon Aden 01/18/2012, 12:18 PM

## 2012-01-18 NOTE — Progress Notes (Signed)
Physical Therapy Session Note  Patient Details  Name: Ryan Hess MRN: [id number] Date of Birth: April 27, 1924  Today's Date: 01/18/2012 Time: 1445-1500 Time Calculation (min): 15 min  Short Term Goals: Week 1:  PT Short Term Goal 1 (Week 1): Pt will be able to transfer min A with RW PT Short Term Goal 2 (Week 1): Pt will be able to demonstrate dynamic sitting balance with supervision PT Short Term Goal 3 (Week 1): Pt will be able to do curb step with RW to simulate home entry with mod A   Skilled Therapeutic Interventions/Progress Updates: Co-Treatment with occupational therapy focusing on bed mobility and edge of bed -> recliner stand pivot transfer with total assist X2; one step commands used for mobility with more success. Pt initially max A to sit EOB but progressed to supervision.  In recliner, worked on awareness, attention, and self-feeding. Therapist occasionally brought food to mouth for patient. Patient with eyes shut during most of therapy session and with language of confusion throughout session.   Pt able to participate better with basic functional tasks and simple commands due to current mental status.       Therapy Documentation Precautions:  Precautions Precautions: Back;Fall Precaution Comments:   Restrictions Weight Bearing Restrictions: No    Pain: Grimaced when initially supine to sit, states his back hurts; medication given. See FIM for current functional status  Therapy/Group: Individual Therapy and Co-Treatment  Karolee Stamps [facility] 01/18/2012, 3:41 PM

## 2012-01-18 NOTE — Progress Notes (Signed)
Patient alert, and restless. Hands in air grasping stating "those rabbits right there". Continuing to assist patient to keep nasal cannula on. Will continue to monitor, and follow plan of care.

## 2012-01-18 NOTE — Progress Notes (Signed)
Physical Therapy Note  Patient Details  Name: [name] MRN: [id number] Date of Birth: [birth year] Today's Date: 01/18/2012  Pt missed 60 minutes of skilled PT. Pt unable to be aroused; only making moaning sounds and snoring. Therapist with attempt at turning lights on, moving pt's extremities, using loud voice, and moving HOB with minimal response from pt (grunting but not opening eyes). O2 via [place]; took 2 readings with 77% and then 96% reading; unable to get a third reading. Notified RN of pt status and of vitals taken.   Karolee Stamps [facility] 01/18/2012, 2:21 PM

## 2012-01-18 NOTE — Progress Notes (Signed)
Physical Therapy Weekly Progress Note  Patient Details  Name: Ryan Hess MRN: [id number] Date of Birth: Jan 10, 1924  Today's Date: 01/18/2012  Patient has met 0 of 3 short term goals.  Pt has declined medically (need for continuous O2 via [place], positive UTI), cognitively, and physically since initial evaluation. Pt with very limited participation due to decreased alertness, decreased cognitive status, and increased need for physical assistance. Pt was min/mod A at eval but now requires total A + 2 for basic mobility due to lethargy, need for continuous O2 and difficulty with basic commands. Pt with hallucinations when awake as well. Pt has good strength when able to use it functionally. Barriers to discharge continue to be decreased caregiver support available; son able to take short time FMLA but not provide long term care. Pt's wife also with dementia and requires some assist as well. Son aware that pt will not be able to be primary caregiver of wife upon d/c from [facility]. Since pt has not been able to attempt stairs, home entry is a barrier at this time as well. Home v SNF for d/c plan due to above mentioned barriers and pt's decline in status. Will continue to monitor and be in discussion with pt's family and case management/social work.   Patient continues to demonstrate the following deficits: decreased activity tolerance, decreased strength, decreased balance, decreased safety and therefore will continue to benefit from skilled PT intervention to enhance overall performance with activity tolerance, balance and knowledge of precautions.  Patient not progressing toward long term goals. Goals remain at supervision/min A level; feel that if patient were able to participate functionally and mental status would clear, this would be appropriate. Will continue to monitor.  PT Short Term Goals Week 1:  PT Short Term Goal 1 (Week 1): Pt will be able to transfer min A with RW PT Short Term Goal 1 - Progress  (Week 1): Not met PT Short Term Goal 2 (Week 1): Pt will be able to demonstrate dynamic sitting balance with supervision PT Short Term Goal 2 - Progress (Week 1): Partly met (at times has met goal but overall inconsistent) PT Short Term Goal 3 (Week 1): Pt will be able to do curb step with RW to simulate home entry with mod A PT Short Term Goal 3 - Progress (Week 1): Not met Week 2:  PT Short Term Goal 1 (Week 2): Pt will be able to transfer min A with RW  PT Short Term Goal 2 (Week 2): Pt will be able to gait with RW x 50' with min A PT Short Term Goal 3 (Week 2): Pt will be able to do curb step with RW to simulate home entry with mod A  Skilled Therapeutic Interventions/Progress Updates:  Ambulation/gait training;Cognitive remediation/compensation;Balance/vestibular training;Discharge planning;DME/adaptive equipment instruction;Functional mobility training;Neuromuscular re-education;Pain management;Patient/family education;Psychosocial support;Therapeutic Activities;Stair training;Skin care/wound management;Therapeutic Exercise;UE/LE Strength taining/ROM;UE/LE Coordination activities;Visual/perceptual remediation/compensation;Wheelchair propulsion/positioning   Therapy Documentation Precautions:  Precautions Precautions: Back;Fall Precaution Comments:   Restrictions Weight Bearing Restrictions: No  See FIM for current functional status   Karolee Stamps [facility] 01/18/2012, 4:40 PM

## 2012-01-19 LAB — BASIC METABOLIC PANEL
BUN: 16 mg/dL (ref 6–23)
CO2: 25 mEq/L (ref 19–32)
Calcium: 8.6 mg/dL (ref 8.4–10.5)
Chloride: 94 mEq/L — ABNORMAL LOW (ref 96–112)
Creatinine, Ser: 0.87 mg/dL (ref 0.50–1.35)
GFR calc Af Amer: 88 mL/min — ABNORMAL LOW (ref >90)
GFR calc non Af Amer: 76 mL/min — ABNORMAL LOW (ref >90)
Glucose, Bld: 101 mg/dL — ABNORMAL HIGH (ref 70–99)
Potassium: 4.3 mEq/L (ref 3.5–5.1)
Sodium: 128 mEq/L — ABNORMAL LOW (ref 135–145)

## 2012-01-19 LAB — URINE CULTURE
Colony Count: 80000
Culture  Setup Time: 201304231812

## 2012-01-19 LAB — GLUCOSE, CAPILLARY: Glucose-Capillary: 122 mg/dL — ABNORMAL HIGH (ref 70–99)

## 2012-01-19 MED ORDER — ENSURE COMPLETE PO LIQD
237.0000 mL | ORAL | Status: DC
  Administered 2012-01-19 – 2012-01-29 (×8): 237 mL via ORAL

## 2012-01-19 NOTE — Progress Notes (Signed)
Physical Therapy Session Note  Patient Details  Name: Ryan Hess MRN: [id number] Date of Birth: 16-Nov-1923  Today's Date: 01/19/2012 Time: 1000-1058 Time Calculation (min): 58 min  Short Term Goals: Week 2:  PT Short Term Goal 1 (Week 2): Pt will be able to transfer min A with RW  PT Short Term Goal 2 (Week 2): Pt will be able to gait with RW x 50' with min A PT Short Term Goal 3 (Week 2): Pt will be able to do curb step with RW to simulate home entry with mod A  Skilled Therapeutic Interventions/Progress Updates:    Therapeutic activity- pt stood x 10 with alternating attention task to share a personal piece of information upon standing each time. Mod cue to do this after first 3 without cues and cues to develop ideas for information as pt speaking very generally but could give details if given certain topics. Repetitive cues for hand placement during transfers and pt does best pushing up with 1 and other on RW. Attempted stand pivot back to bed without RW but pt stated he couldn't get up but then was successful once RW in front of him again.  Gait training- pt unable to widen distance between feet with cues, steps; pt c/o fatigue  Therapy Documentation Precautions:  Precautions Precautions: Fall Precaution Comments: O2 Restrictions Weight Bearing Restrictions: No General:   Vital Signs: Oxygen Therapy SpO2: 92 % O2 Device: Nasal cannula O2 Flow Rate (L/min): 3 L/min Pain: Pain Assessment Pain Assessment: No/denies pain Mobility: Bed Mobility Sit to Supine: 3: Mod assist Sit to Supine - Details (indicate cue type and reason): Pt UNble to lift either leg into bed, very fatigued Transfers Sit to Stand: 4: Min assist;With armrests;With upper extremity assist Sit to Stand Details: Verbal cues for technique;Verbal cues for safe use of DME/AE;Manual facilitation for weight shifting Sit to Stand Details (indicate cue type and reason): assist for forward weight shift and cues  for hand placement Stand to Sit: 5: Supervision;4: Min assist Stand to Sit Details (indicate cue type and reason): Verbal cues for technique;Verbal cues for safe use of DME/AE Stand to Sit Details: for hand placment and at times to control descent Stand Pivot Transfers: 4: Min guard;With armrests (with RW) Locomotion : Ambulation Ambulation/Gait Assistance: 4: Min assist Ambulation/Gait Assistance Details: Verbal cues for precautions/safety;Verbal cues for gait pattern;Verbal cues for safe use of DME/AE Ambulation/Gait Assistance Details: cues to widen BOS- feet toouching, 20 ft indooors and 40 feet outside      Balance: Static Standing Balance Static Standing - Level of Assistance: 5: Stand by assistance (with B UE support but visable LE tremor with fatigue) See FIM for current functional status  Therapy/Group: Individual Therapy  Othelia Pulling 01/19/2012, 11:47 AM

## 2012-01-19 NOTE — Progress Notes (Signed)
Occupational Therapy Weekly Progress Note & Session Notes  Patient Details  Name: Ryan Hess MRN: [id number] Date of Birth: 1924-01-19  Today's Date: 01/19/2012  WEEKLY PROGRESS NOTE  Patient has met 1 of 4 short term goals.  Patient did not meet set LB dressing goal of min assist, tub/shower transfer goal of min assist, or performing grooming tasks independently. Patient did meet toilet transfer goal of min assist. Patient unable to meet LB dressing goal secondary to decreased cognition, at this time patient unable to use AE to assist with donning underwear, pants, and socks/shoes. Plan to introduce AE when cognition improves/increases. Patient did not meet tub/shower transfer goal secondary to therapist unable to attempt transfer secondary to fatigue and decreased cognition. Plan to attempt tub/shower transfer as soon as possible. Patient also did not meet grooming goal secondary to decreased cognition. Patient requires min verbal cues in order to perform tasks (patient tried using body was for toothpaste). Continue plan of care at this time, patient fluctuates with level of assistance needed from day to day and session to session secondary to decreased cognition and fatigue. When and if able, patient is able to perform sit/stands with min assist, transfers with min assist, and functional ambulation using rolling walker.  Patient continues to demonstrate the following deficits: decreased awareness, decreased attention, decreased problem solving, decreased memory, decreased dynamic & static sitting balance/tolerance, decreased dynamic & static standing balance/tolerance, decreased functional mobility, and decreased independence with transfers. Therefore, patient will continue to benefit from skilled OT intervention to enhance overall performance with BADL, iADL and Reduce care partner burden.  OT Short Term Goals Week 1:  OT Short Term Goal 1 (Week 1): Patient will perform LB dressing with min  assist using AE prn OT Short Term Goal 1 - Progress (Week 1): Not met OT Short Term Goal 2 (Week 1): Patient will perform toilet transfers with min assist using AD and DME prn OT Short Term Goal 2 - Progress (Week 1): Met OT Short Term Goal 3 (Week 1): Patient will perform tub/shower transfer with min assist using DME prn OT Short Term Goal 3 - Progress (Week 1): Not met OT Short Term Goal 4 (Week 1): Patient will perform grooming tasks independently (LTG) OT Short Term Goal 4 - Progress (Week 1): Not met  Week 2:  OT Short Term Goal 1 (Week 2): Patient will perform LB dressing with min assist using AE prn OT Short Term Goal 2 (Week 2): Patient will perform grooming tasks independently (LTG) OT Short Term Goal 3 (Week 2): Patient will perform tub/shower transfer with min assist using AD and DME prn OT Short Term Goal 4 (Week 2): Patient will maintain dynamic standing balance during functional tasks using AD prn with supervision  Skilled Therapeutic Interventions/Progress Updates:  Balance/vestibular training;Cognitive remediation/compensation;Community reintegration;Discharge planning;DME/adaptive equipment instruction;Functional mobility training;Neuromuscular re-education;Pain management;Patient/family education;Psychosocial support;Self Care/advanced ADL retraining;Skin care/wound managment;Splinting/orthotics;Therapeutic Activities;Therapeutic Exercise;UE/LE Strength taining/ROM;UE/LE Coordination activities;Wheelchair propulsion/positioning   Precautions:  Precautions Precautions: Back;Fall Precaution Comments:   Restrictions Weight Bearing Restrictions: No  See FIM for current functional status  SESSION NOTES  Session #1 0900-1000 - 60 Minutes Individual Therapy No complaints of pain Upon entering room patient supine in bed. Engaged in bed mobility for UB/LB bathing and dressing at bed level in sit->stand position, functional ambulation within room using rolling walker, and  grooming tasks seated at sink in w/c. Focused skilled intervention on overall activity tolerance/endurance, sit/stands, and dynamic standing. Patients 02 sats decreased ->76% on room air and  only increased ->84% with 2 liters of oxygen; increased oxygen ->3 liters and 02 sats increased to 97%.   Session #2 1430-1500 - 30 Minutes Individual Therapy No complaints of pain Therapeutic activity outside focusing on attention, awareness, and functional use of bilateral UEs. Patient more alert this pm, compared to yesterday pm.   Ryan Hess 01/19/2012, 10:27 AM

## 2012-01-19 NOTE — Progress Notes (Signed)
Physical Therapy Session Note  Patient Details  Name: Ryan Hess MRN: [id number] Date of Birth: Oct 26, 1923  Today's Date: 01/19/2012 Time: 4098-1191 Time Calculation (min): 45 min  Short Term Goals: Week 2:  PT Short Term Goal 1 (Week 2): Pt will be able to transfer min A with RW  PT Short Term Goal 2 (Week 2): Pt will be able to gait with RW x 50' with min A PT Short Term Goal 3 (Week 2): Pt will be able to do curb step with RW to simulate home entry with mod A  Skilled Therapeutic Interventions/Progress Updates:    Gait training for MRADL's in room, min A with RW to toilet and go to sink to wash hands, pt incontinent of bowel and unaware, NT informed. 10 m walk test performed 32.5 sec,Patient at increased fall risk as noted by decreased gait velocity.  (increased fall risk with velocity less than 1.8 ft/sec). Patient current velocity is 1.0 ft/sec with narrow BOS increasing tripping /fall risk.    Therapeutic activity- standing balance catching and throwing ball with min A for LOB, increase unsteadiness with fatigue. Attempted to perform alternating attention task and name an animal when pt had ball but total A for this. Decreased cognition and increased confusion as compared to morning session (pt thought he had gone to the movies) but pt fully alert. Cues 100% of transfers for hand placement.       Therapy Documentation Precautions:  Precautions Precautions: Fall Precaution Comments: O2 Restrictions Weight Bearing Restrictions: No General:unable to get O2 readings  3L/O2 Pain: Pain Assessment Pain Assessment: No/denies pain Mobility: Bed Mobility Supine to Sit: 5: Supervision;HOB elevated;With rails Sit to Stand: 4: Min assist;With armrests;With upper extremity assist Sit to Stand Details: Verbal cues for technique;Verbal cues for safe use of DME/AE;Manual facilitation for weight shifting Sit to Stand Details (indicate cue type and reason): assist for forward weight  shift and cues for hand placement Stand to Sit: 5: Supervision;4: Min assist Stand to Sit Details (indicate cue type and reason): Verbal cues for technique;Verbal cues for safe use of DME/AE Stand to Sit Details: for hand placment and at times to control descent Locomotion : Ambulation Ambulation/Gait Assistance: 4: Min assist Ambulation/Gait Assistance Details: Verbal cues for precautions/safety;Verbal cues for gait pattern;Verbal cues for safe use of DME/AE Ambulation/Gait Assistance Details: cues to widen BOS- 15 ft x 2, 10, 35 ft Gait Gait velocity: 1.00 ft/sec      Balance: Static Sitting Balance Static Sitting - Level of Assistance: 5: Stand by assistance Static Standing Balance Static Standing - Level of Assistance: 5: Stand by assistance Dynamic Standing Balance Dynamic Standing - Level of Assistance: 4: Min assist frequent LOB catching ball  Other Treatments:   Cognitive rehab for orientation, awareness, memory, problem solving and attentionSee FIM for current functional status  Therapy/Group: Individual Therapy  Michaelene Song 01/19/2012, 1:51 PM

## 2012-01-19 NOTE — Progress Notes (Signed)
Subjective/Complaints: Review of Systems  Unable to perform ROS: mental acuity  more appropriate today. Still a little slow to arouse. Follows simple command with better awareness.  Objective: Vital Signs: Blood pressure 130/70, pulse 68, temperature 96.6 F (35.9 C), temperature source Axillary, resp. rate 16, height 5\' 9"  (1.753 m), weight 83.734 kg (184 lb 9.6 oz), SpO2 100.00%. No results found. Results for orders placed during the hospital encounter of 01/11/12 (from the past 72 hour(s))  URINALYSIS, ROUTINE W REFLEX MICROSCOPIC     Status: Abnormal   Collection Time   01/16/12  4:26 PM      Component Value Range Comment   Color, Urine AMBER (*) YELLOW  BIOCHEMICALS MAY BE AFFECTED BY COLOR   APPearance HAZY (*) CLEAR     Specific Gravity, Urine 1.028  1.005 - 1.030     pH 5.5  5.0 - 8.0     Glucose, UA NEGATIVE  NEGATIVE (mg/dL)    Hgb urine dipstick NEGATIVE  NEGATIVE     Bilirubin Urine SMALL (*) NEGATIVE     Ketones, ur NEGATIVE  NEGATIVE (mg/dL)    Protein, ur NEGATIVE  NEGATIVE (mg/dL)    Urobilinogen, UA 4.0 (*) 0.0 - 1.0 (mg/dL)    Nitrite NEGATIVE  NEGATIVE     Leukocytes, UA SMALL (*) NEGATIVE    URINE CULTURE     Status: Normal   Collection Time   01/16/12  4:26 PM      Component Value Range Comment   Specimen Description URINE, CATHETERIZED      Special Requests NONE      Culture  Setup Time [id number]      Colony Count 80,000 COLONIES/ML      Culture PSEUDOMONAS AERUGINOSA      Report Status 01/19/2012 FINAL      Organism ID, Bacteria PSEUDOMONAS AERUGINOSA     URINE MICROSCOPIC-ADD ON     Status: Abnormal   Collection Time   01/16/12  4:26 PM      Component Value Range Comment   Squamous Epithelial / LPF RARE  RARE     WBC, UA 3-6  <3 (WBC/hpf)    Bacteria, UA FEW (*) RARE     Casts HYALINE CASTS (*) NEGATIVE     Urine-Other MUCOUS PRESENT     CBC     Status: Abnormal   Collection Time   01/17/12  6:05 AM      Component Value Range Comment   WBC  12.4 (*) 4.0 - 10.5 (K/uL)    RBC 3.93 (*) 4.22 - 5.81 (MIL/uL)    Hemoglobin 12.4 (*) 13.0 - 17.0 (g/dL)    HCT 95.6 (*) 21.3 - 52.0 (%)    MCV 91.1  78.0 - 100.0 (fL)    MCH 31.6  26.0 - 34.0 (pg)    MCHC 34.6  30.0 - 36.0 (g/dL)    RDW 08.6  57.8 - 46.9 (%)    Platelets 373  150 - 400 (K/uL)   DIFFERENTIAL     Status: Abnormal   Collection Time   01/17/12  6:05 AM      Component Value Range Comment   Neutrophils Relative 85 (*) 43 - 77 (%)    Neutro Abs 10.5 (*) 1.7 - 7.7 (K/uL)    Lymphocytes Relative 8 (*) 12 - 46 (%)    Lymphs Abs 0.9  0.7 - 4.0 (K/uL)    Monocytes Relative 7  3 - 12 (%)    Monocytes Absolute 0.8  0.1 - 1.0 (K/uL)    Eosinophils Relative 1  0 - 5 (%)    Eosinophils Absolute 0.1  0.0 - 0.7 (K/uL)    Basophils Relative 0  0 - 1 (%)    Basophils Absolute 0.0  0.0 - 0.1 (K/uL)   BASIC METABOLIC PANEL     Status: Abnormal   Collection Time   01/17/12  6:05 AM      Component Value Range Comment   Sodium 126 (*) 135 - 145 (mEq/L)    Potassium 3.9  3.5 - 5.1 (mEq/L)    Chloride 92 (*) 96 - 112 (mEq/L)    CO2 26  19 - 32 (mEq/L)    Glucose, Bld 82  70 - 99 (mg/dL)    BUN 21  6 - 23 (mg/dL)    Creatinine, Ser 4.09  0.50 - 1.35 (mg/dL)    Calcium 8.7  8.4 - 10.5 (mg/dL)    GFR calc non Af Amer 76 (*) >90 (mL/min)    GFR calc Af Amer 88 (*) >90 (mL/min)   BASIC METABOLIC PANEL     Status: Abnormal   Collection Time   01/18/12  6:25 AM      Component Value Range Comment   Sodium 124 (*) 135 - 145 (mEq/L)    Potassium 4.3  3.5 - 5.1 (mEq/L)    Chloride 92 (*) 96 - 112 (mEq/L)    CO2 23  19 - 32 (mEq/L)    Glucose, Bld 88  70 - 99 (mg/dL)    BUN 20  6 - 23 (mg/dL)    Creatinine, Ser 8.11  0.50 - 1.35 (mg/dL)    Calcium 8.6  8.4 - 10.5 (mg/dL)    GFR calc non Af Amer 75 (*) >90 (mL/min)    GFR calc Af Amer 87 (*) >90 (mL/min)         Assessment/Plan: 1. Functional deficits secondary to Paraparesis due to epidural hematoma L2,L3 compression which require 3+  hours per day of interdisciplinary therapy in a comprehensive inpatient rehab setting. Physiatrist is providing close team supervision and 24 hour management of active medical problems listed below. Physiatrist and rehab team continue to assess barriers to discharge/monitor patient progress toward functional and medical goals. FIM: FIM - Bathing Bathing Steps Patient Completed: Chest;Right Arm;Left Arm;Abdomen;Right upper leg;Left upper leg;Front perineal area;Buttocks Bathing: 4: Min-Patient completes 8-9 42f 10 parts or 75+ percent  FIM - Upper Body Dressing/Undressing Upper body dressing/undressing steps patient completed: Thread/unthread right sleeve of pullover shirt/dresss;Thread/unthread left sleeve of pullover shirt/dress;Put head through opening of pull over shirt/dress;Pull shirt over trunk Upper body dressing/undressing: 5: Supervision: Safety issues/verbal cues FIM - Lower Body Dressing/Undressing Lower body dressing/undressing steps patient completed: Pull pants up/down Lower body dressing/undressing: 1: Total-Patient completed less than 25% of tasks  FIM - Toileting Toileting steps completed by patient: Performs perineal hygiene Toileting: 2: Max-Patient completed 1 of 3 steps  FIM - Diplomatic Services operational officer Devices: Art gallery manager Transfers: 4-To toilet/BSC: Min A (steadying Pt. > 75%);4-From toilet/BSC: Min A (steadying Pt. > 75%)  FIM - Bed/Chair Transfer Bed/Chair Transfer Assistive Devices: Bed rails Bed/Chair Transfer: 1: Two helpers  FIM - Locomotion: Wheelchair Locomotion: Wheelchair: 2: Travels 50 - 149 ft with maximal assistance (Pt: 25 - 49%) FIM - Locomotion: Ambulation Locomotion: Ambulation Assistive Devices: Designer, industrial/product Ambulation/Gait Assistance: 3: Mod assist Locomotion: Ambulation: 1: Two helpers  Comprehension Comprehension Mode: Auditory Comprehension: 2-Understands basic 25 - 49% of the time/requires cueing 51 -  75% of the  time  Expression Expression Mode: Verbal Expression: 2-Expresses basic 25 - 49% of the time/requires cueing 50 - 75% of the time. Uses single words/gestures.  Social Interaction Social Interaction Mode: Asleep Social Interaction: 2-Interacts appropriately 25 - 49% of time - Needs frequent redirection.  Problem Solving Problem Solving: 1-Solves basic less than 25% of the time - needs direction nearly all the time or does not effectively solve problems and may need a restraint for safety  Memory Memory: 1-Recognizes or recalls less than 25% of the time/requires cueing greater than 75% of the time  2. Anticoagulation/DVT prophylaxis with Pharmaceutical: Heparin  Medical Problem List and Plan:  1. lumbar epidural hematoma with paraparesis. 4 level laminectomy and evacuation of lumbar L2-3 epidural hematoma 01/05/2012  2. DVT Prophylaxis/Anticoagulation: Subcutaneous heparin. Monitor platelet counts and any signs of bleeding  3. Pain Management: will use tylenol due to AMS.   4. abdominal aortic aneurysm/thoraco abdominal aneurysm. Endovascularly repaired 01/01/2012. Will need followup at [facility]  5. Chronic atrial fibrillation. Cardiac rate control. Continue atenolol 50 mg daily. Coumadin and aspirin remain on hold for one month from date of surgery 01/05/2012 due to epidural hematoma  6. Urinary retention. Requiring occasional I/O cath. ucx pending. 7. Congestive heart failure. Lasix held for low Na, cont losartan Monitor for any signs of fluid overload  8. Hyponatremia. Lasix held. Sodium hovering in mid 120's 9. COPD. Continue Spiriva and Flonase.  10. Hypertension: Continue losartan 100 mg daily. Monitor for any signs of orthostasis  11.  Somnolence/confusion: still confused  -pseudomonas UTI- cipro sensitive. Seems to be showing improvement  -sodium lower today (124) but close to baseline.   -continue FR and follow labs serially 11. Hyperlipidemia.atorvastatin    LOS (Days)  8 A FACE TO FACE EVALUATION WAS PERFORMED  Rhett Najera T 01/19/2012, 7:43 AM

## 2012-01-19 NOTE — Care Management Note (Signed)
Patient ID: Ryan Hess, male   DOB: [birth year], 78 y.o.   MRN: [id number] Update faxed to [facility] at [facility].

## 2012-01-19 NOTE — Progress Notes (Signed)
INITIAL ADULT NUTRITION ASSESSMENT Date: 01/19/2012   Time: 3:04 PM  Reason for Assessment: Low Braden  ASSESSMENT: Male 78 y.o.  Dx: Epidural hematoma  Hx:  Past Medical History  Diagnosis Date  . COPD (chronic obstructive pulmonary disease)   . Pneumonia, organism unspecified   . Pulmonary nodule   . HTN (hypertension)   . CHF (congestive heart failure)   . Atrial fibrillation   . Drug therapy   . PVC (premature ventricular contraction)   . PVD (peripheral vascular disease)   . Thoracic aortic aneurysm   . Hypercholesteremia   . Diverticulosis of colon   . Colon polyps   . Prostate cancer   . DJD (degenerative joint disease)   . Actinic skin damage   . Hyperlipidemia   . Internal hemorrhoids    Past Surgical History  Procedure Date  . Total knee arthroplasty     bilateral  . Abdominal aortic aneurysm repair 1998  . Appendectomy   . Inguinal hernia repair     left  . Joint replacement    Related Meds:     . atenolol  50 mg Oral Daily  . atorvastatin  10 mg Oral q1800  . ciprofloxacin  500 mg Oral BID  . fluticasone  1 spray Each Nare BID  . guaiFENesin  1,200 mg Oral BID  . heparin subcutaneous  5,000 Units Subcutaneous Q8H  . losartan  100 mg Oral Daily  . mulitivitamin with minerals  1 tablet Oral Daily  . potassium chloride  10 mEq Oral Daily  . senna  1 tablet Oral Daily  . Tamsulosin HCl  0.4 mg Oral QPC supper  . tiotropium  18 mcg Inhalation Daily   Ht: 5\' 9"  (175.3 cm)  Wt: 184 lb 9.6 oz (83.734 kg)  Ideal Wt: 72.7 kg % Ideal Wt: 115%  Wt Readings from Last 15 Encounters:  01/17/12 184 lb 9.6 oz (83.734 kg)  01/11/12 185 lb (83.915 kg)  11/30/11 186 lb 1.9 oz (84.423 kg)  08/31/11 187 lb (84.823 kg)  08/28/11 194 lb 6.4 oz (88.179 kg)  08/24/11 186 lb (84.369 kg)  05/30/11 196 lb (88.905 kg)  05/15/11 193 lb 3.2 oz (87.635 kg)  12/21/10 195 lb 12 oz (88.792 kg)  07/15/10 195 lb (88.451 kg)  05/27/10 192 lb 8 oz (87.317 kg)    05/04/10 191 lb (86.637 kg)  03/15/10 191 lb 5 oz (86.779 kg)  03/10/10 191 lb (86.637 kg)  02/25/10 189 lb (85.73 kg)  Usual Wt: 195 lb % Usual Wt: 94%  Body mass index is 27.26 kg/(m^2). Pt is overweight.  Food/Nutrition Related Hx: unable to obtain at this time  Labs:  CMP     Component Value Date/Time   NA 128* 01/19/2012 0640   K 4.3 01/19/2012 0640   CL 94* 01/19/2012 0640   CO2 25 01/19/2012 0640   GLUCOSE 101* 01/19/2012 0640   BUN 16 01/19/2012 0640   CREATININE 0.87 01/19/2012 0640   CREATININE 1.30 08/29/2011 1147   CALCIUM 8.6 01/19/2012 0640   PROT 5.3* 01/12/2012 0500   ALBUMIN 2.4* 01/12/2012 0500   AST 45* 01/12/2012 0500   ALT 34 01/12/2012 0500   ALKPHOS 172* 01/12/2012 0500   BILITOT 2.5* 01/12/2012 0500   GFRNONAA 76* 01/19/2012 0640   GFRAA 88* 01/19/2012 0640    Intake/Output Summary (Last 24 hours) at 01/19/12 1504 Last data filed at 01/19/12 1300  Gross per 24 hour  Intake    540 ml  Output   1851 ml  Net  -1311 ml   Diet Order: General  Supplements/Tube Feeding: none  IVF:    Estimated Nutritional Needs:   Kcal:  1800 - 2000 kcal  Protein:  85 - 100 grams Fluid:  1.8 - 2 L/d  Pt is s/p lumbar epidural hematoma with paraparesis. 4 level laminectomy and evacuation of lumbar L2-3 epidural hematoma 01/05/2012.   RD drawn to chart 2/2 Low Braden. Intake is variable, most recently intake has been 75 - 100%. RN reports that pt will do well with hand-on-hand initiation of feeding. Pt with 6% wt loss x 4 months. Attempted to discuss nutrition hx with patient, however he was confused and unable to answer questions. Pt is at nutrition risk given advanced age, declining weight, and variable PO intake.  NUTRITION DIAGNOSIS: -Increased nutrient needs (NI-5.1).  Status: Ongoing  RELATED TO: recent surgery and participation in therapy activities  AS EVIDENCE BY: estimated needs  MONITORING/EVALUATION(Goals): Goal: Pt to consume >/=75% of meals. Monitor: PO  intake, weights, labs, I/O's  EDUCATION NEEDS: -No education needs identified at this time  INTERVENTION: 1. Ensure Complete PO daily to help meet nutrition needs 2. RD to continue to follow  Dietitian #: [id number]  DOCUMENTATION CODES Per approved criteria  -Not Applicable   Adair Laundry 01/19/2012, 3:04 PM

## 2012-01-20 LAB — BASIC METABOLIC PANEL
BUN: 24 mg/dL — ABNORMAL HIGH (ref 6–23)
BUN: 15 mg/dL (ref 6–23)
CO2: 23 mEq/L (ref 19–32)
CO2: 24 mEq/L (ref 19–32)
Calcium: 9.2 mg/dL (ref 8.4–10.5)
Calcium: 8.5 mg/dL (ref 8.4–10.5)
Chloride: 95 mEq/L — ABNORMAL LOW (ref 96–112)
Chloride: 95 mEq/L — ABNORMAL LOW (ref 96–112)
Creatinine, Ser: 1.94 mg/dL — ABNORMAL HIGH (ref 0.50–1.35)
Creatinine, Ser: 0.97 mg/dL (ref 0.50–1.35)
GFR calc Af Amer: 34 mL/min — ABNORMAL LOW (ref >90)
GFR calc Af Amer: 84 mL/min — ABNORMAL LOW (ref >90)
GFR calc non Af Amer: 30 mL/min — ABNORMAL LOW (ref >90)
GFR calc non Af Amer: 73 mL/min — ABNORMAL LOW (ref >90)
Glucose, Bld: 87 mg/dL (ref 70–99)
Glucose, Bld: 95 mg/dL (ref 70–99)
Potassium: 4.3 mEq/L (ref 3.5–5.1)
Potassium: 4.9 mEq/L (ref 3.5–5.1)
Sodium: 129 mEq/L — ABNORMAL LOW (ref 135–145)
Sodium: 126 mEq/L — ABNORMAL LOW (ref 135–145)

## 2012-01-20 LAB — GLUCOSE, CAPILLARY
Glucose-Capillary: 102 mg/dL — ABNORMAL HIGH (ref 70–99)
Glucose-Capillary: 113 mg/dL — ABNORMAL HIGH (ref 70–99)
Glucose-Capillary: 103 mg/dL — ABNORMAL HIGH (ref 70–99)
Glucose-Capillary: 140 mg/dL — ABNORMAL HIGH (ref 70–99)

## 2012-01-20 MED ORDER — SODIUM CHLORIDE 0.45 % IV SOLN
INTRAVENOUS | Status: DC
  Administered 2012-01-20: 14:00:00 via INTRAVENOUS

## 2012-01-20 NOTE — Progress Notes (Signed)
Patient ID: Ryan Hess, male   DOB: [birth year], 78 y.o.   MRN: [id number]  Subjective/Complaints: Review of Systems  All other systems reviewed and are negative.  had a better day yesterday. More alert. Still confused at times. Had a chance to speak with his daughter yesterday  Subjective/Complaints: Review of Systems  Unable to perform ROS: mental acuity  more appropriate today. Still a little slow to arouse. Follows simple command with better awareness.  Objective: Vital Signs: Blood pressure 130/70, pulse 68, temperature 96.6 F (35.9 C), temperature source Axillary, resp. rate 16, height 5\' 9"  (1.753 m), weight 83.734 kg (184 lb 9.6 oz), SpO2 100.00%. No results found. Results for orders placed during the hospital encounter of 01/11/12 (from the past 72 hour(s))  URINALYSIS, ROUTINE W REFLEX MICROSCOPIC     Status: Abnormal   Collection Time   01/16/12  4:26 PM      Component Value Range Comment   Color, Urine AMBER (*) YELLOW  BIOCHEMICALS MAY BE AFFECTED BY COLOR   APPearance HAZY (*) CLEAR     Specific Gravity, Urine 1.028  1.005 - 1.030     pH 5.5  5.0 - 8.0     Glucose, UA NEGATIVE  NEGATIVE (mg/dL)    Hgb urine dipstick NEGATIVE  NEGATIVE     Bilirubin Urine SMALL (*) NEGATIVE     Ketones, ur NEGATIVE  NEGATIVE (mg/dL)    Protein, ur NEGATIVE  NEGATIVE (mg/dL)    Urobilinogen, UA 4.0 (*) 0.0 - 1.0 (mg/dL)    Nitrite NEGATIVE  NEGATIVE     Leukocytes, UA SMALL (*) NEGATIVE    URINE CULTURE     Status: Normal   Collection Time   01/16/12  4:26 PM      Component Value Range Comment   Specimen Description URINE, CATHETERIZED      Special Requests NONE      Culture  Setup Time [id number]      Colony Count 80,000 COLONIES/ML      Culture PSEUDOMONAS AERUGINOSA      Report Status 01/19/2012 FINAL      Organism ID, Bacteria PSEUDOMONAS AERUGINOSA     URINE MICROSCOPIC-ADD ON     Status: Abnormal   Collection Time   01/16/12  4:26 PM      Component Value Range  Comment   Squamous Epithelial / LPF RARE  RARE     WBC, UA 3-6  <3 (WBC/hpf)    Bacteria, UA FEW (*) RARE     Casts HYALINE CASTS (*) NEGATIVE     Urine-Other MUCOUS PRESENT     CBC     Status: Abnormal   Collection Time   01/17/12  6:05 AM      Component Value Range Comment   WBC 12.4 (*) 4.0 - 10.5 (K/uL)    RBC 3.93 (*) 4.22 - 5.81 (MIL/uL)    Hemoglobin 12.4 (*) 13.0 - 17.0 (g/dL)    HCT 96.2 (*) 95.2 - 52.0 (%)    MCV 91.1  78.0 - 100.0 (fL)    MCH 31.6  26.0 - 34.0 (pg)    MCHC 34.6  30.0 - 36.0 (g/dL)    RDW 84.1  32.4 - 40.1 (%)    Platelets 373  150 - 400 (K/uL)   DIFFERENTIAL     Status: Abnormal   Collection Time   01/17/12  6:05 AM      Component Value Range Comment   Neutrophils Relative 85 (*) 43 - 77 (%)  Neutro Abs 10.5 (*) 1.7 - 7.7 (K/uL)    Lymphocytes Relative 8 (*) 12 - 46 (%)    Lymphs Abs 0.9  0.7 - 4.0 (K/uL)    Monocytes Relative 7  3 - 12 (%)    Monocytes Absolute 0.8  0.1 - 1.0 (K/uL)    Eosinophils Relative 1  0 - 5 (%)    Eosinophils Absolute 0.1  0.0 - 0.7 (K/uL)    Basophils Relative 0  0 - 1 (%)    Basophils Absolute 0.0  0.0 - 0.1 (K/uL)   BASIC METABOLIC PANEL     Status: Abnormal   Collection Time   01/17/12  6:05 AM      Component Value Range Comment   Sodium 126 (*) 135 - 145 (mEq/L)    Potassium 3.9  3.5 - 5.1 (mEq/L)    Chloride 92 (*) 96 - 112 (mEq/L)    CO2 26  19 - 32 (mEq/L)    Glucose, Bld 82  70 - 99 (mg/dL)    BUN 21  6 - 23 (mg/dL)    Creatinine, Ser 4.09  0.50 - 1.35 (mg/dL)    Calcium 8.7  8.4 - 10.5 (mg/dL)    GFR calc non Af Amer 76 (*) >90 (mL/min)    GFR calc Af Amer 88 (*) >90 (mL/min)   BASIC METABOLIC PANEL     Status: Abnormal   Collection Time   01/18/12  6:25 AM      Component Value Range Comment   Sodium 124 (*) 135 - 145 (mEq/L)    Potassium 4.3  3.5 - 5.1 (mEq/L)    Chloride 92 (*) 96 - 112 (mEq/L)    CO2 23  19 - 32 (mEq/L)    Glucose, Bld 88  70 - 99 (mg/dL)    BUN 20  6 - 23 (mg/dL)    Creatinine,  Ser 8.11  0.50 - 1.35 (mg/dL)    Calcium 8.6  8.4 - 10.5 (mg/dL)    GFR calc non Af Amer 75 (*) >90 (mL/min)    GFR calc Af Amer 87 (*) >90 (mL/min)     Alert-slow to initiate early this am HEENT: PERRL, EOMI HRT: RRR, no M,R,G CHEST: clear with w,r,r, Abd: soft, non tender, BS + Neuro: moves all exts', alert to name and place. Follows simple commands.     Assessment/Plan: 1. Functional deficits secondary to Paraparesis due to epidural hematoma L2,L3 compression which require 3+ hours per day of interdisciplinary therapy in a comprehensive inpatient rehab setting. Physiatrist is providing close team supervision and 24 hour management of active medical problems listed below. Physiatrist and rehab team continue to assess barriers to discharge/monitor patient progress toward functional and medical goals. FIM: FIM - Bathing Bathing Steps Patient Completed: Chest;Right Arm;Left Arm;Abdomen;Right upper leg;Left upper leg;Front perineal area;Buttocks Bathing: 4: Min-Patient completes 8-9 47f 10 parts or 75+ percent  FIM - Upper Body Dressing/Undressing Upper body dressing/undressing steps patient completed: Thread/unthread right sleeve of pullover shirt/dresss;Thread/unthread left sleeve of pullover shirt/dress;Put head through opening of pull over shirt/dress;Pull shirt over trunk Upper body dressing/undressing: 5: Supervision: Safety issues/verbal cues FIM - Lower Body Dressing/Undressing Lower body dressing/undressing steps patient completed: Pull pants up/down Lower body dressing/undressing: 1: Total-Patient completed less than 25% of tasks  FIM - Toileting Toileting steps completed by patient: Performs perineal hygiene Toileting: 2: Max-Patient completed 1 of 3 steps  FIM - Diplomatic Services operational officer Devices: Art gallery manager Transfers: 4-To toilet/BSC: Min A (steadying  Pt. > 75%);4-From toilet/BSC: Min A (steadying Pt. > 75%)  FIM - Bed/Chair Transfer Bed/Chair  Transfer Assistive Devices: Bed rails Bed/Chair Transfer: 1: Two helpers  FIM - Locomotion: Wheelchair Locomotion: Wheelchair: 2: Travels 50 - 149 ft with maximal assistance (Pt: 25 - 49%) FIM - Locomotion: Ambulation Locomotion: Ambulation Assistive Devices: Designer, industrial/product Ambulation/Gait Assistance: 3: Mod assist Locomotion: Ambulation: 1: Two helpers  Comprehension Comprehension Mode: Auditory Comprehension: 2-Understands basic 25 - 49% of the time/requires cueing 51 - 75% of the time  Expression Expression Mode: Verbal Expression: 2-Expresses basic 25 - 49% of the time/requires cueing 50 - 75% of the time. Uses single words/gestures.  Social Interaction Social Interaction Mode: Asleep Social Interaction: 2-Interacts appropriately 25 - 49% of time - Needs frequent redirection.  Problem Solving Problem Solving: 1-Solves basic less than 25% of the time - needs direction nearly all the time or does not effectively solve problems and may need a restraint for safety  Memory Memory: 1-Recognizes or recalls less than 25% of the time/requires cueing greater than 75% of the time  2. Anticoagulation/DVT prophylaxis with Pharmaceutical: Heparin  Medical Problem List and Plan:  1. lumbar epidural hematoma with paraparesis. 4 level laminectomy and evacuation of lumbar L2-3 epidural hematoma 01/05/2012  2. DVT Prophylaxis/Anticoagulation: Subcutaneous heparin. Monitor platelet counts and any signs of bleeding  3. Pain Management: will use tylenol due to AMS.   4. abdominal aortic aneurysm/thoraco abdominal aneurysm. Endovascularly repaired 01/01/2012. Will need followup at [facility]  5. Chronic atrial fibrillation. Cardiac rate control. Continue atenolol 50 mg daily. Coumadin and aspirin remain on hold for one month from date of surgery 01/05/2012 due to epidural hematoma  6. Urinary retention. Requiring occasional I/O cath. ucx pending. 7. Congestive heart failure. Lasix held for low  Na, cont losartan Monitor for any signs of fluid overload  8. Hyponatremia. Lasix held. Sodium hovering in mid 120's 9. COPD. Continue Spiriva and Flonase.  10. Hypertension: Continue losartan 100 mg daily. Monitor for any signs of orthostasis  11.  Somnolence/confusion: still confused  -pseudomonas UTI- cipro sensitive. Showing clinical improvement  -sodium lower today (124) but close to baseline.   -continue FR and follow labs serially-if stable today can back off daily bmet 11. Hyperlipidemia.atorvastatin    LOS (Days) 8 A FACE TO FACE EVALUATION WAS PERFORMED  Lanea Vankirk T 01/19/2012, 7:43 AM              more appropriate today. Still a little slow to arouse. Follows simple command with better awareness.  Objective: Vital Signs: Blood pressure 115/84, pulse 82, temperature 98 F (36.7 C), temperature source Oral, resp. rate 12, height 5\' 9"  (1.753 m), weight 83.734 kg (184 lb 9.6 oz), SpO2 95.00%. No results found. Results for orders placed during the hospital encounter of 01/11/12 (from the past 72 hour(s))  BASIC METABOLIC PANEL     Status: Abnormal   Collection Time   01/18/12  6:25 AM      Component Value Range Comment   Sodium 124 (*) 135 - 145 (mEq/L)    Potassium 4.3  3.5 - 5.1 (mEq/L)    Chloride 92 (*) 96 - 112 (mEq/L)    CO2 23  19 - 32 (mEq/L)    Glucose, Bld 88  70 - 99 (mg/dL)    BUN 20  6 - 23 (mg/dL)    Creatinine, Ser 1.61  0.50 - 1.35 (mg/dL)    Calcium 8.6  8.4 - 10.5 (mg/dL)    GFR  calc non Af Amer 75 (*) >90 (mL/min)    GFR calc Af Amer 87 (*) >90 (mL/min)   BASIC METABOLIC PANEL     Status: Abnormal   Collection Time   01/19/12  6:40 AM      Component Value Range Comment   Sodium 128 (*) 135 - 145 (mEq/L)    Potassium 4.3  3.5 - 5.1 (mEq/L)    Chloride 94 (*) 96 - 112 (mEq/L)    CO2 25  19 - 32 (mEq/L)    Glucose, Bld 101 (*) 70 - 99 (mg/dL)    BUN 16  6 - 23 (mg/dL)    Creatinine, Ser 1.61  0.50 - 1.35 (mg/dL)    Calcium 8.6  8.4  - 10.5 (mg/dL)    GFR calc non Af Amer 76 (*) >90 (mL/min)    GFR calc Af Amer 88 (*) >90 (mL/min)   GLUCOSE, CAPILLARY     Status: Abnormal   Collection Time   01/19/12  8:43 PM      Component Value Range Comment   Glucose-Capillary 122 (*) 70 - 99 (mg/dL)    Comment 1 Notify RN           Assessment/Plan: 1. Functional deficits secondary to Paraparesis due to epidural hematoma L2,L3 compression which require 3+ hours per day of interdisciplinary therapy in a comprehensive inpatient rehab setting. Physiatrist is providing close team supervision and 24 hour management of active medical problems listed below. Physiatrist and rehab team continue to assess barriers to discharge/monitor patient progress toward functional and medical goals. FIM: FIM - Bathing Bathing Steps Patient Completed: Chest;Right Arm;Left Arm;Abdomen;Front perineal area;Right upper leg;Left upper leg Bathing: 3: Mod-Patient completes 5-7 49f 10 parts or 50-74%  FIM - Upper Body Dressing/Undressing Upper body dressing/undressing steps patient completed: Thread/unthread right sleeve of pullover shirt/dresss;Thread/unthread left sleeve of pullover shirt/dress;Put head through opening of pull over shirt/dress;Pull shirt over trunk Upper body dressing/undressing: 4: Steadying assist FIM - Lower Body Dressing/Undressing Lower body dressing/undressing steps patient completed: Pull pants up/down Lower body dressing/undressing: 1: Total-Patient completed less than 25% of tasks  FIM - Toileting Toileting steps completed by patient: Adjust clothing prior to toileting Toileting: 2: Max-Patient completed 1 of 3 steps  FIM - Diplomatic Services operational officer Devices: Art gallery manager Transfers: 2-To toilet/BSC: Max A (lift and lower assist);2-From toilet/BSC: Max A (lift and lower assist) (needs lift and lower A)  FIM - Bed/Chair Transfer Bed/Chair Transfer Assistive Devices: Bed rails Bed/Chair Transfer: 5: Supine >  Sit: Supervision (verbal cues/safety issues)  FIM - Locomotion: Wheelchair Locomotion: Wheelchair: 1: Total Assistance/staff pushes wheelchair (Pt<25%) FIM - Locomotion: Ambulation Locomotion: Ambulation Assistive Devices: Designer, industrial/product Ambulation/Gait Assistance: 4: Min assist Locomotion: Ambulation: 1: Travels less than 50 ft with minimal assistance (Pt.>75%)  Comprehension Comprehension Mode: Auditory Comprehension: 4-Understands basic 75 - 89% of the time/requires cueing 10 - 24% of the time  Expression Expression Mode: Verbal Expression: 2-Expresses basic 25 - 49% of the time/requires cueing 50 - 75% of the time. Uses single words/gestures.  Social Interaction Social Interaction Mode: Asleep Social Interaction: 2-Interacts appropriately 25 - 49% of time - Needs frequent redirection.  Problem Solving Problem Solving: 1-Solves basic less than 25% of the time - needs direction nearly all the time or does not effectively solve problems and may need a restraint for safety  Memory Memory: 1-Recognizes or recalls less than 25% of the time/requires cueing greater than 75% of the time  2. Anticoagulation/DVT prophylaxis with  Pharmaceutical: Heparin  Medical Problem List and Plan:  1. lumbar epidural hematoma with paraparesis. 4 level laminectomy and evacuation of lumbar L2-3 epidural hematoma 01/05/2012  2. DVT Prophylaxis/Anticoagulation: Subcutaneous heparin. Monitor platelet counts and any signs of bleeding  3. Pain Management: will use tylenol due to AMS.   4. abdominal aortic aneurysm/thoraco abdominal aneurysm. Endovascularly repaired 01/01/2012. Will need followup at [facility]  5. Chronic atrial fibrillation. Cardiac rate control. Continue atenolol 50 mg daily. Coumadin and aspirin remain on hold for one month from date of surgery 01/05/2012 due to epidural hematoma  6. Urinary retention. Requiring occasional I/O cath. ucx pending. 7. Congestive heart failure. Lasix held  for low Na, cont losartan Monitor for any signs of fluid overload  8. Hyponatremia. Lasix held. Sodium hovering in mid 120's 9. COPD. Continue Spiriva and Flonase.  10. Hypertension: Continue losartan 100 mg daily. Monitor for any signs of orthostasis  11.  Somnolence/confusion: still confused  -pseudomonas UTI- cipro sensitive. Seems to be showing improvement  -sodium lower today (124) but close to baseline.   -continue FR and follow labs serially 11. Hyperlipidemia.atorvastatin    LOS (Days) 9 A FACE TO FACE EVALUATION WAS PERFORMED  Anaid Haney T 01/20/2012, 6:47 AM

## 2012-01-20 NOTE — Progress Notes (Signed)
Physical Therapy Note  Patient Details  Name: Ryan Hess MRN: [id number] Date of Birth: [birth year] Today's Date: 01/20/2012 Time: 1015-1100 (45') Pain: Generalized Arthritis pain with moving wrong but currently 0/10  Therapeutic Exercise: (15') B LE's in supine Therapeutic Activity: (15') Bed Mobility with min-A, Transfers with Min-A including supine<->sit via logroll on R side and      with sit<->stand and with bed<->chair. Verbal and tactile cues for safety. Gait Training: (15') Using RW 3 x 10' with min-A for balance and verbal cues for posture.  Individual Therapy Session   Rex Kras 01/20/2012, 10:26 AM

## 2012-01-21 LAB — BASIC METABOLIC PANEL
BUN: 14 mg/dL (ref 6–23)
CO2: 24 mEq/L (ref 19–32)
Calcium: 8.5 mg/dL (ref 8.4–10.5)
Chloride: 95 mEq/L — ABNORMAL LOW (ref 96–112)
Creatinine, Ser: 0.87 mg/dL (ref 0.50–1.35)
GFR calc Af Amer: 88 mL/min — ABNORMAL LOW (ref >90)
GFR calc non Af Amer: 76 mL/min — ABNORMAL LOW (ref >90)
Glucose, Bld: 85 mg/dL (ref 70–99)
Potassium: 4.3 mEq/L (ref 3.5–5.1)
Sodium: 128 mEq/L — ABNORMAL LOW (ref 135–145)

## 2012-01-21 LAB — GLUCOSE, CAPILLARY
Glucose-Capillary: 86 mg/dL (ref 70–99)
Glucose-Capillary: 107 mg/dL — ABNORMAL HIGH (ref 70–99)
Glucose-Capillary: 106 mg/dL — ABNORMAL HIGH (ref 70–99)
Glucose-Capillary: 108 mg/dL — ABNORMAL HIGH (ref 70–99)

## 2012-01-21 NOTE — Progress Notes (Signed)
Occupational Therapy Session Note  Patient Details  Name: Ryan Hess MRN: [id number] Date of Birth: [birth year]  Today's Date: 01/21/2012 Time: 0730-0815 Time Calculation (min): 45 min  Skilled Therapeutic Interventions/Progress Updates: Patient scheduled for Bathing/Dressing and completed task supine in bed as he was difficult to keep aroused and participatory.  When he was awake, he was disoriented to place and situation.  He was able to stay awake long enough to wash his face and upper body with mod prompts to attend and participate.    Therapy Documentation Precautions:  Precautions Precautions: Fall Precaution Comments: O2 Restrictions Weight Bearing Restrictions: No Pain: none reported   See FIM for current functional status  Therapy/Group: Individual Therapy  Bud Face [facility] 01/21/2012, 3:34 PM

## 2012-01-21 NOTE — Progress Notes (Signed)
Patient ID: LAVARR PRESIDENT, male   DOB: Jan 29, 1924, 79 y.o.   MRN: [id number] Patient ID: MALIKYE REPPOND, male   DOB: [birth year], 78 y.o.   MRN: [id number]  Subjective/Complaints: Review of Systems  All other systems reviewed and are negative.    Subjective/Complaints: Review of Systems   more appropriate today. More alert. Still limited in insight and awareness.  Objective: Vital Signs: Blood pressure 130/70, pulse 68, temperature 96.6 F (35.9 C), temperature source Axillary, resp. rate 16, height 5\' 9"  (1.753 m), weight 83.734 kg (184 lb 9.6 oz), SpO2 100.00%. No results found.    Alert-slow to initiate early this am HEENT: PERRL, EOMI HRT: RRR, no M,R,G CHEST: clear with w,r,r, Abd: soft, non tender, BS + Neuro: moves all exts', alert to name and place. Follows simple commands.     Assessment/Plan: 1. Functional deficits secondary to Paraparesis due to epidural hematoma L2,L3 compression which require 3+ hours per day of interdisciplinary therapy in a comprehensive inpatient rehab setting. Physiatrist is providing close team supervision and 24 hour management of active medical problems listed below. Physiatrist and rehab team continue to assess barriers to discharge/monitor patient progress toward functional and medical goals. FIM: FIM - Bathing Bathing Steps Patient Completed: Chest;Right Arm;Left Arm;Abdomen;Right upper leg;Left upper leg;Front perineal area;Buttocks Bathing: 4: Min-Patient completes 8-9 31f 10 parts or 75+ percent  FIM - Upper Body Dressing/Undressing Upper body dressing/undressing steps patient completed: Thread/unthread right sleeve of pullover shirt/dresss;Thread/unthread left sleeve of pullover shirt/dress;Put head through opening of pull over shirt/dress;Pull shirt over trunk Upper body dressing/undressing: 5: Supervision: Safety issues/verbal cues FIM - Lower Body Dressing/Undressing Lower body dressing/undressing steps patient completed: Pull  pants up/down Lower body dressing/undressing: 1: Total-Patient completed less than 25% of tasks  FIM - Toileting Toileting steps completed by patient: Performs perineal hygiene Toileting: 2: Max-Patient completed 1 of 3 steps  FIM - Diplomatic Services operational officer Devices: Art gallery manager Transfers: 4-To toilet/BSC: Min A (steadying Pt. > 75%);4-From toilet/BSC: Min A (steadying Pt. > 75%)  FIM - Bed/Chair Transfer Bed/Chair Transfer Assistive Devices: Bed rails Bed/Chair Transfer: 1: Two helpers  FIM - Locomotion: Wheelchair Locomotion: Wheelchair: 2: Travels 50 - 149 ft with maximal assistance (Pt: 25 - 49%) FIM - Locomotion: Ambulation Locomotion: Ambulation Assistive Devices: Designer, industrial/product Ambulation/Gait Assistance: 3: Mod assist Locomotion: Ambulation: 1: Two helpers  Comprehension Comprehension Mode: Auditory Comprehension: 2-Understands basic 25 - 49% of the time/requires cueing 51 - 75% of the time  Expression Expression Mode: Verbal Expression: 2-Expresses basic 25 - 49% of the time/requires cueing 50 - 75% of the time. Uses single words/gestures.  Social Interaction Social Interaction Mode: Asleep Social Interaction: 2-Interacts appropriately 25 - 49% of time - Needs frequent redirection.  Problem Solving Problem Solving: 1-Solves basic less than 25% of the time - needs direction nearly all the time or does not effectively solve problems and may need a restraint for safety  Memory Memory: 1-Recognizes or recalls less than 25% of the time/requires cueing greater than 75% of the time  2. Anticoagulation/DVT prophylaxis with Pharmaceutical: Heparin  Medical Problem List and Plan:  1. lumbar epidural hematoma with paraparesis. 4 level laminectomy and evacuation of lumbar L2-3 epidural hematoma 01/05/2012  2. DVT Prophylaxis/Anticoagulation: Subcutaneous heparin. Monitor platelet counts and any signs of bleeding  3. Pain Management: will use tylenol due  to AMS.   4. abdominal aortic aneurysm/thoraco abdominal aneurysm. Endovascularly repaired 01/01/2012. Will need followup at [facility]  5. Chronic atrial fibrillation. Cardiac  rate control. Continue atenolol 50 mg daily. Coumadin and aspirin remain on hold for one month from date of surgery 01/05/2012 due to epidural hematoma  6. Urinary retention. Requiring intermittent caths still. May need to consider foley at discharge. uro f/ui as an outpt 7. Congestive heart failure. Lasix held for low Na, cont losartan Monitor for any signs of fluid overload  8. Hyponatremia. Lasix held. Sodium hovering in mid 120's 9. COPD. Continue Spiriva and Flonase.  10. Hypertension: Continue losartan 100 mg daily. Monitor for any signs of orthostasis  11.  Somnolence/confusion: still confused  -pseudomonas UTI- cipro sensitive. Showing clinical improvement  -sodium lower today (124) but close to baseline.  -relax FR and follow labs serially-dc daily bmet    11. Hyperlipidemia.atorvastatin    LOS (Days) 8 A FACE TO FACE EVALUATION WAS PERFORMED  Jasraj Lappe T 01/19/2012, 7:43 AM              more appropriate today. Still a little slow to arouse. Follows simple command with better awareness.  Objective: Vital Signs: Blood pressure 142/67, pulse 92, temperature 97.5 F (36.4 C), temperature source Axillary, resp. rate 16, height 5\' 9"  (1.753 m), weight 83.734 kg (184 lb 9.6 oz), SpO2 81.00%. No results found. Results for orders placed during the hospital encounter of 01/11/12 (from the past 72 hour(s))  BASIC METABOLIC PANEL     Status: Abnormal   Collection Time   01/19/12  6:40 AM      Component Value Range Comment   Sodium 128 (*) 135 - 145 (mEq/L)    Potassium 4.3  3.5 - 5.1 (mEq/L)    Chloride 94 (*) 96 - 112 (mEq/L)    CO2 25  19 - 32 (mEq/L)    Glucose, Bld 101 (*) 70 - 99 (mg/dL)    BUN 16  6 - 23 (mg/dL)    Creatinine, Ser 1.61  0.50 - 1.35 (mg/dL)    Calcium 8.6  8.4 - 10.5  (mg/dL)    GFR calc non Af Amer 76 (*) >90 (mL/min)    GFR calc Af Amer 88 (*) >90 (mL/min)   GLUCOSE, CAPILLARY     Status: Abnormal   Collection Time   01/19/12  8:43 PM      Component Value Range Comment   Glucose-Capillary 122 (*) 70 - 99 (mg/dL)    Comment 1 Notify RN     BASIC METABOLIC PANEL     Status: Abnormal   Collection Time   01/20/12  6:30 AM      Component Value Range Comment   Sodium 129 (*) 135 - 145 (mEq/L)    Potassium 4.3  3.5 - 5.1 (mEq/L)    Chloride 95 (*) 96 - 112 (mEq/L)    CO2 23  19 - 32 (mEq/L)    Glucose, Bld 87  70 - 99 (mg/dL)    BUN 24 (*) 6 - 23 (mg/dL)    Creatinine, Ser 0.96 (*) 0.50 - 1.35 (mg/dL) DELTA CHECK NOTED   Calcium 9.2  8.4 - 10.5 (mg/dL)    GFR calc non Af Amer 30 (*) >90 (mL/min)    GFR calc Af Amer 34 (*) >90 (mL/min)   GLUCOSE, CAPILLARY     Status: Abnormal   Collection Time   01/20/12  7:27 AM      Component Value Range Comment   Glucose-Capillary 102 (*) 70 - 99 (mg/dL)   GLUCOSE, CAPILLARY     Status: Abnormal   Collection Time  01/20/12 11:36 AM      Component Value Range Comment   Glucose-Capillary 113 (*) 70 - 99 (mg/dL)   BASIC METABOLIC PANEL     Status: Abnormal   Collection Time   01/20/12  4:38 PM      Component Value Range Comment   Sodium 126 (*) 135 - 145 (mEq/L)    Potassium 4.9  3.5 - 5.1 (mEq/L)    Chloride 95 (*) 96 - 112 (mEq/L)    CO2 24  19 - 32 (mEq/L)    Glucose, Bld 95  70 - 99 (mg/dL)    BUN 15  6 - 23 (mg/dL)    Creatinine, Ser 8.11  0.50 - 1.35 (mg/dL) DELTA CHECK NOTED   Calcium 8.5  8.4 - 10.5 (mg/dL)    GFR calc non Af Amer 73 (*) >90 (mL/min)    GFR calc Af Amer 84 (*) >90 (mL/min)   GLUCOSE, CAPILLARY     Status: Abnormal   Collection Time   01/20/12  4:52 PM      Component Value Range Comment   Glucose-Capillary 103 (*) 70 - 99 (mg/dL)   GLUCOSE, CAPILLARY     Status: Abnormal   Collection Time   01/20/12  7:55 PM      Component Value Range Comment   Glucose-Capillary 140 (*) 70 - 99  (mg/dL)    Comment 1 Notify RN     BASIC METABOLIC PANEL     Status: Abnormal   Collection Time   01/21/12  5:45 AM      Component Value Range Comment   Sodium 128 (*) 135 - 145 (mEq/L)    Potassium 4.3  3.5 - 5.1 (mEq/L)    Chloride 95 (*) 96 - 112 (mEq/L)    CO2 24  19 - 32 (mEq/L)    Glucose, Bld 85  70 - 99 (mg/dL)    BUN 14  6 - 23 (mg/dL)    Creatinine, Ser 9.14  0.50 - 1.35 (mg/dL)    Calcium 8.5  8.4 - 10.5 (mg/dL)    GFR calc non Af Amer 76 (*) >90 (mL/min)    GFR calc Af Amer 88 (*) >90 (mL/min)         Assessment/Plan: 1. Functional deficits secondary to Paraparesis due to epidural hematoma L2,L3 compression which require 3+ hours per day of interdisciplinary therapy in a comprehensive inpatient rehab setting. Physiatrist is providing close team supervision and 24 hour management of active medical problems listed below. Physiatrist and rehab team continue to assess barriers to discharge/monitor patient progress toward functional and medical goals. FIM: FIM - Bathing Bathing Steps Patient Completed: Chest;Right Arm;Left Arm;Abdomen;Front perineal area;Right upper leg;Left upper leg Bathing: 3: Mod-Patient completes 5-7 60f 10 parts or 50-74%  FIM - Upper Body Dressing/Undressing Upper body dressing/undressing steps patient completed: Thread/unthread right sleeve of pullover shirt/dresss;Thread/unthread left sleeve of pullover shirt/dress;Put head through opening of pull over shirt/dress;Pull shirt over trunk Upper body dressing/undressing: 4: Steadying assist FIM - Lower Body Dressing/Undressing Lower body dressing/undressing steps patient completed: Pull pants up/down Lower body dressing/undressing: 1: Total-Patient completed less than 25% of tasks  FIM - Toileting Toileting steps completed by patient: Adjust clothing prior to toileting Toileting: 2: Max-Patient completed 1 of 3 steps  FIM - Diplomatic Services operational officer Devices: Art gallery manager  Transfers: 2-To toilet/BSC: Max A (lift and lower assist);2-From toilet/BSC: Max A (lift and lower assist) (needs lift and lower A)  FIM - Banker  Devices: Bed rails Bed/Chair Transfer: 5: Supine > Sit: Supervision (verbal cues/safety issues)  FIM - Locomotion: Wheelchair Locomotion: Wheelchair: 1: Total Assistance/staff pushes wheelchair (Pt<25%) FIM - Locomotion: Ambulation Locomotion: Ambulation Assistive Devices: Designer, industrial/product Ambulation/Gait Assistance: 4: Min assist Locomotion: Ambulation: 1: Travels less than 50 ft with minimal assistance (Pt.>75%)  Comprehension Comprehension Mode: Auditory Comprehension: 4-Understands basic 75 - 89% of the time/requires cueing 10 - 24% of the time  Expression Expression Mode: Verbal Expression: 2-Expresses basic 25 - 49% of the time/requires cueing 50 - 75% of the time. Uses single words/gestures.  Social Interaction Social Interaction Mode: Asleep Social Interaction: 2-Interacts appropriately 25 - 49% of time - Needs frequent redirection.  Problem Solving Problem Solving: 1-Solves basic less than 25% of the time - needs direction nearly all the time or does not effectively solve problems and may need a restraint for safety  Memory Memory: 1-Recognizes or recalls less than 25% of the time/requires cueing greater than 75% of the time  2. Anticoagulation/DVT prophylaxis with Pharmaceutical: Heparin  Medical Problem List and Plan:  1. lumbar epidural hematoma with paraparesis. 4 level laminectomy and evacuation of lumbar L2-3 epidural hematoma 01/05/2012  2. DVT Prophylaxis/Anticoagulation: Subcutaneous heparin. Monitor platelet counts and any signs of bleeding  3. Pain Management: will use tylenol due to AMS.   4. abdominal aortic aneurysm/thoraco abdominal aneurysm. Endovascularly repaired 01/01/2012. Will need followup at [facility]  5. Chronic atrial fibrillation. Cardiac rate control.  Continue atenolol 50 mg daily. Coumadin and aspirin remain on hold for one month from date of surgery 01/05/2012 due to epidural hematoma  6. Urinary retention. Requiring occasional I/O cath. ucx pending. 7. Congestive heart failure. Lasix held for low Na, cont losartan Monitor for any signs of fluid overload  8. Hyponatremia. Lasix held. Sodium hovering in mid 120's 9. COPD. Continue Spiriva and Flonase.  10. Hypertension: Continue losartan 100 mg daily. Monitor for any signs of orthostasis  11.  Somnolence/confusion: still confused  -pseudomonas UTI- cipro sensitive. Seems to be showing improvement  -sodium lower today (124) but close to baseline.   -continue FR and follow labs serially 11. Hyperlipidemia.atorvastatin    LOS (Days) 10 A FACE TO FACE EVALUATION WAS PERFORMED  Abb Gobert T 01/21/2012, 7:43 AM

## 2012-01-21 NOTE — Progress Notes (Signed)
Patient did not sleep last night, very frigidity, taking of gown and diaper. BM x 1 in brief and patient picked at it and got it all over the bed requiring full linen and clothing change. Patient noted with legs hanging off the bed x 2 during the shift with alarm sounding simultaneously. Attempting to orient patient to surroundings but unsuccessful. Patient currently in bed with alarm on and door open for frequent observation.

## 2012-01-22 NOTE — Progress Notes (Signed)
Occupational Therapy Session Notes  Patient Details  Name: Ryan Hess MRN: [id number] Date of Birth: [birth year]  Today's Date: 01/22/2012  Short Term Goals: Week 1:  OT Short Term Goal 1 (Week 1): Patient will perform LB dressing with min assist using AE prn OT Short Term Goal 1 - Progress (Week 1): Not met OT Short Term Goal 2 (Week 1): Patient will perform toilet transfers with min assist using AD and DME prn OT Short Term Goal 2 - Progress (Week 1): Met OT Short Term Goal 3 (Week 1): Patient will perform tub/shower transfer with min assist using DME prn OT Short Term Goal 3 - Progress (Week 1): Not met OT Short Term Goal 4 (Week 1): Patient will perform grooming tasks independently (LTG) OT Short Term Goal 4 - Progress (Week 1): Not met  Week 2:  OT Short Term Goal 1 (Week 2): Patient will perform LB dressing with min assist using AE prn OT Short Term Goal 2 (Week 2): Patient will perform grooming tasks independently (LTG) OT Short Term Goal 3 (Week 2): Patient will perform tub/shower transfer with min assist using AD and DME prn OT Short Term Goal 4 (Week 2): Patient will maintain dynamic standing balance during functional tasks using AD prn with supervision  Skilled Therapeutic Interventions/Progress Updates:   Session #1 0900-1000 - 60 Minutes Individual Therapy No complaints of pain Upon entering room patient supine in bed. Therapist gave min verbal cues and min physical assist for patient to engage in bed mobility to sit edge of bed. Engaged in UB/LB bathing & dressing edge of bed. Focused skilled intervention on overall attention, overall awareness, memory, problem solving, overall activity tolerance/endurance, functional mobility using rolling walker, and grooming tasks seated at sink in w/c with close supervision and min verbal cues.   Session #2 [id number] - 25 Minutes Individual Therapy No complaints of pain Upon entering room patient seated in w/c and asking "where  is my wife?". Therapist oriented patient to place and discussed how his wife was at home, patient seemed to understand. Therapist then propelled patient from room -> ADL apartment for functional mobility using rolling walker, sit/stands emphasizing correct body mechanics, and attempted tub/shower transfer onto tub transfer bench. Once seated on tub transfer bench patient stated "I can't do this, I'm tired", and after therapist encouraged patient to participate and engage in transfer patient continued to refuse stating "I just don't want to do this right now". Therapist propelled patient back to room for a recliner transfer. Left patient in recliner with call bell and phone within reach. Patient on 2 liters of 02 during entire session.   Precautions:  Precautions Precautions: Fall Precaution Comments: O2 Restrictions Weight Bearing Restrictions: No  See FIM for current functional status  [name] 01/22/2012, 9:51 AM

## 2012-01-22 NOTE — Progress Notes (Signed)
Physical Therapy Session Note  Patient Details  Name: Ryan Hess MRN: [id number] Date of Birth: Mar 16, 1924  Today's Date: 01/22/2012 Time: 4098-1191 Time Calculation (min): 45 min  Short Term Goals: Week 1:  PT Short Term Goal 1 (Week 1): Pt will be able to transfer min A with RW PT Short Term Goal 1 - Progress (Week 1): Not met PT Short Term Goal 2 (Week 1): Pt will be able to demonstrate dynamic sitting balance with supervision PT Short Term Goal 2 - Progress (Week 1): Partly met (at times has met goal but overall inconsistent) PT Short Term Goal 3 (Week 1): Pt will be able to do curb step with RW to simulate home entry with mod A PT Short Term Goal 3 - Progress (Week 1): Not met Week 2:  PT Short Term Goal 1 (Week 2): Pt will be able to transfer min A with RW  PT Short Term Goal 2 (Week 2): Pt will be able to gait with RW x 50' with min A PT Short Term Goal 3 (Week 2): Pt will be able to do curb step with RW to simulate home entry with mod A  Skilled Therapeutic Interventions/Progress Updates: Focus on functional transfers, dynamic gait, and dynamic balance. Transfers from w/c, bed, and toilet with overall min A, max cueing needed for hand placement and safety with all transfers. Toileting overall steady A, pt adjusting pants prior and performing hygiene but needing assist to pull pants back up (continent BM) with intermittent use of grab bar for assist. Dynamic gait to find 5 horseshoes and pick up working on balance, dynamic gait, and reaching outside BOS with overall min A. Dynamic standing balance activity to toss horseshoes and work on sit to stands x 3 reps of 5 horseshoes with overall min A.      Therapy Documentation Precautions:  Precautions Precautions: Fall Precaution Comments: O2 Restrictions Weight Bearing Restrictions: No  Pain:  No complaints of pain.  See FIM for current functional status  Therapy/Group: Individual Therapy  Karolee Stamps  [facility] 01/22/2012, 3:37 PM

## 2012-01-22 NOTE — Progress Notes (Signed)
More alert today. Responded appropriately to questions. Memory deficit evident at times. Participating in therapy. Lumbar sutures removed per order, pt tolerated well. No unsafe behavior. Requires staff to anticipate needs. Receiving I&O cath q 8hrs.

## 2012-01-22 NOTE — Progress Notes (Signed)
Patient ID: Ryan Hess, male   DOB: 18-Jul-1924, 78 y.o.   MRN: [id number] Patient ID: Ryan Hess, male   DOB: 1923/12/15, 78 y.o.   MRN: [id number] Patient ID: Ryan Hess, male   DOB: 1924/08/13, 78 y.o.   MRN: [id number]  Subjective/Complaints: Review of Systems  All other systems reviewed and are negative.  fairly good night. Denies pain.    Objective: Vital Signs: Blood pressure 130/70, pulse 68, temperature 96.6 F (35.9 C), temperature source Axillary, resp. rate 16, height '5\' 9"'$  (1.753 m), weight 83.734 kg (184 lb 9.6 oz), SpO2 100.00%. No results found.    Alert-slow to initiate early this am HEENT: PERRL, EOMI HRT: RRR, no M,R,G CHEST: clear with w,r,r, Abd: soft, non tender, BS + Neuro: moves all exts', alert to name and place. Follows simple commands.  Wound clean and intact with sutures    Assessment/Plan: 1. Functional deficits secondary to Paraparesis due to epidural hematoma L2,L3 compression which require 3+ hours per day of interdisciplinary therapy in a comprehensive inpatient rehab setting. Physiatrist is providing close team supervision and 24 hour management of active medical problems listed below. Physiatrist and rehab team continue to assess barriers to discharge/monitor patient progress toward functional and medical goals.  ?SNF  FIM: FIM - Bathing Bathing Steps Patient Completed: Chest;Right Arm;Left Arm;Abdomen;Right upper leg;Left upper leg;Front perineal area;Buttocks Bathing: 4: Min-Patient completes 8-9 87f10 parts or 75+ percent  FIM - Upper Body Dressing/Undressing Upper body dressing/undressing steps patient completed: Thread/unthread right sleeve of pullover shirt/dresss;Thread/unthread left sleeve of pullover shirt/dress;Put head through opening of pull over shirt/dress;Pull shirt over trunk Upper body dressing/undressing: 5: Supervision: Safety issues/verbal cues FIM - Lower Body Dressing/Undressing Lower body  dressing/undressing steps patient completed: Pull pants up/down Lower body dressing/undressing: 1: Total-Patient completed less than 25% of tasks  FIM - Toileting Toileting steps completed by patient: Performs perineal hygiene Toileting: 2: Max-Patient completed 1 of 3 steps  FIM - TRadio producerDevices: WInsurance account managerTransfers: 4-To toilet/BSC: Min A (steadying Pt. > 75%);4-From toilet/BSC: Min A (steadying Pt. > 75%)  FIM - Bed/Chair Transfer Bed/Chair Transfer Assistive Devices: Bed rails Bed/Chair Transfer: 1: Two helpers  FIM - Locomotion: Wheelchair Locomotion: Wheelchair: 2: Travels 50 - 149 ft with maximal assistance (Pt: 25 - 49%) FIM - Locomotion: Ambulation Locomotion: Ambulation Assistive Devices: WAdministratorAmbulation/Gait Assistance: 3: Mod assist Locomotion: Ambulation: 1: Two helpers  Comprehension Comprehension Mode: Auditory Comprehension: 2-Understands basic 25 - 49% of the time/requires cueing 51 - 75% of the time  Expression Expression Mode: Verbal Expression: 2-Expresses basic 25 - 49% of the time/requires cueing 50 - 75% of the time. Uses single words/gestures.  Social Interaction Social Interaction Mode: Asleep Social Interaction: 2-Interacts appropriately 25 - 49% of time - Needs frequent redirection.  Problem Solving Problem Solving: 1-Solves basic less than 25% of the time - needs direction nearly all the time or does not effectively solve problems and may need a restraint for safety  Memory Memory: 1-Recognizes or recalls less than 25% of the time/requires cueing greater than 75% of the time  2. Anticoagulation/DVT prophylaxis with Pharmaceutical: Heparin  Medical Problem List and Plan:  1. lumbar epidural hematoma with paraparesis. 4 level laminectomy and evacuation of lumbar L2-3 epidural hematoma 01/05/2012  2. DVT Prophylaxis/Anticoagulation: Subcutaneous heparin. Monitor platelet counts and any signs of  bleeding  3. Pain Management: will use tylenol due to AMS.   4. abdominal aortic aneurysm/thoraco abdominal aneurysm. Endovascularly repaired  01/01/2012. Will need followup at [facility]  5. Chronic atrial fibrillation. Cardiac rate control. Continue atenolol 50 mg daily. Coumadin and aspirin remain on hold for one month from date of surgery 01/05/2012 due to epidural hematoma  6. Urinary retention. Requiring intermittent caths still. May need to consider foley at discharge. uro f/u as an outpt 7. Congestive heart failure. Lasix held for low Na, cont losartan Monitoring for any signs of fluid overload  8. Hyponatremia. Lasix held. Sodium improving. Recheck tomorrow 9. COPD. Continue Spiriva and Flonase.  10. Hypertension: Continue losartan 100 mg daily. Monitor for any signs of orthostasis  11.  Somnolence/confusion: still confused  -pseudomonas UTI- cipro sensitive. Showing clinical improvement  -sodium trending upward  -relax FR and follow labs serially-dc daily bmet    11. Hyperlipidemia.atorvastatin    A FACE TO FACE EVALUATION WAS PERFORMED  Ronnisha Felber T              more appropriate today. Still a little slow to arouse. Follows simple command with better awareness.  Objective: Vital Signs: Blood pressure 126/81, pulse 62, temperature 97.8 F (36.6 C), temperature source Axillary, resp. rate 16, height '5\' 9"'$  (1.753 m), weight 83.734 kg (184 lb 9.6 oz), SpO2 92.00%. No results found. Results for orders placed during the hospital encounter of 01/11/12 (from the past 72 hour(s))  GLUCOSE, CAPILLARY     Status: Abnormal   Collection Time   01/19/12  8:43 PM      Component Value Range Comment   Glucose-Capillary 122 (*) 70 - 99 (mg/dL)    Comment 1 Notify RN     BASIC METABOLIC PANEL     Status: Abnormal   Collection Time   01/20/12  6:30 AM      Component Value Range Comment   Sodium 129 (*) 135 - 145 (mEq/L)    Potassium 4.3  3.5 - 5.1 (mEq/L)    Chloride 95  (*) 96 - 112 (mEq/L)    CO2 23  19 - 32 (mEq/L)    Glucose, Bld 87  70 - 99 (mg/dL)    BUN 24 (*) 6 - 23 (mg/dL)    Creatinine, Ser 1.94 (*) 0.50 - 1.35 (mg/dL) DELTA CHECK NOTED   Calcium 9.2  8.4 - 10.5 (mg/dL)    GFR calc non Af Amer 30 (*) >90 (mL/min)    GFR calc Af Amer 34 (*) >90 (mL/min)   GLUCOSE, CAPILLARY     Status: Abnormal   Collection Time   01/20/12  7:27 AM      Component Value Range Comment   Glucose-Capillary 102 (*) 70 - 99 (mg/dL)   GLUCOSE, CAPILLARY     Status: Abnormal   Collection Time   01/20/12 11:36 AM      Component Value Range Comment   Glucose-Capillary 113 (*) 70 - 99 (mg/dL)   BASIC METABOLIC PANEL     Status: Abnormal   Collection Time   01/20/12  4:38 PM      Component Value Range Comment   Sodium 126 (*) 135 - 145 (mEq/L)    Potassium 4.9  3.5 - 5.1 (mEq/L)    Chloride 95 (*) 96 - 112 (mEq/L)    CO2 24  19 - 32 (mEq/L)    Glucose, Bld 95  70 - 99 (mg/dL)    BUN 15  6 - 23 (mg/dL)    Creatinine, Ser 0.97  0.50 - 1.35 (mg/dL) DELTA CHECK NOTED   Calcium 8.5  8.4 - 10.5 (  mg/dL)    GFR calc non Af Amer 73 (*) >90 (mL/min)    GFR calc Af Amer 84 (*) >90 (mL/min)   GLUCOSE, CAPILLARY     Status: Abnormal   Collection Time   01/20/12  4:52 PM      Component Value Range Comment   Glucose-Capillary 103 (*) 70 - 99 (mg/dL)   GLUCOSE, CAPILLARY     Status: Abnormal   Collection Time   01/20/12  7:55 PM      Component Value Range Comment   Glucose-Capillary 140 (*) 70 - 99 (mg/dL)    Comment 1 Notify RN     BASIC METABOLIC PANEL     Status: Abnormal   Collection Time   01/21/12  5:45 AM      Component Value Range Comment   Sodium 128 (*) 135 - 145 (mEq/L)    Potassium 4.3  3.5 - 5.1 (mEq/L)    Chloride 95 (*) 96 - 112 (mEq/L)    CO2 24  19 - 32 (mEq/L)    Glucose, Bld 85  70 - 99 (mg/dL)    BUN 14  6 - 23 (mg/dL)    Creatinine, Ser 0.87  0.50 - 1.35 (mg/dL)    Calcium 8.5  8.4 - 10.5 (mg/dL)    GFR calc non Af Amer 76 (*) >90 (mL/min)    GFR  calc Af Amer 88 (*) >90 (mL/min)   GLUCOSE, CAPILLARY     Status: Normal   Collection Time   01/21/12  7:41 AM      Component Value Range Comment   Glucose-Capillary 86  70 - 99 (mg/dL)    Comment 1 Notify RN      Comment 2 Documented in Chart     GLUCOSE, CAPILLARY     Status: Abnormal   Collection Time   01/21/12 11:18 AM      Component Value Range Comment   Glucose-Capillary 107 (*) 70 - 99 (mg/dL)    Comment 1 Notify RN      Comment 2 Documented in Chart     GLUCOSE, CAPILLARY     Status: Abnormal   Collection Time   01/21/12  5:01 PM      Component Value Range Comment   Glucose-Capillary 106 (*) 70 - 99 (mg/dL)    Comment 1 Notify RN     GLUCOSE, CAPILLARY     Status: Abnormal   Collection Time   01/21/12  8:13 PM      Component Value Range Comment   Glucose-Capillary 108 (*) 70 - 99 (mg/dL)    Comment 1 Notify RN           Assessment/Plan: 1. Functional deficits secondary to Paraparesis due to epidural hematoma L2,L3 compression which require 3+ hours per day of interdisciplinary therapy in a comprehensive inpatient rehab setting. Physiatrist is providing close team supervision and 24 hour management of active medical problems listed below. Physiatrist and rehab team continue to assess barriers to discharge/monitor patient progress toward functional and medical goals. FIM: FIM - Bathing Bathing Steps Patient Completed: Chest;Right Arm;Left Arm;Abdomen;Right upper leg;Left upper leg (in bed as pt. was lethargic & inattentive for approx 50% ) Bathing: 3: Mod-Patient completes 5-7 86f10 parts or 50-74%  FIM - Upper Body Dressing/Undressing Upper body dressing/undressing steps patient completed: Thread/unthread right sleeve of pullover shirt/dresss;Thread/unthread left sleeve of pullover shirt/dress;Put head through opening of pull over shirt/dress;Pull shirt over trunk Upper body dressing/undressing: 5: Supervision: Safety issues/verbal cues FIM -  Lower Body  Dressing/Undressing Lower body dressing/undressing steps patient completed:  (dressed in bed as patient inattentive & not abel to go EOB) Lower body dressing/undressing: 1: Total-Patient completed less than 25% of tasks  FIM - Toileting Toileting steps completed by patient: Adjust clothing prior to toileting Toileting Assistive Devices: Grab bar or rail for support Toileting: 2: Max-Patient completed 1 of 3 steps  FIM - Radio producer Devices: Insurance account manager Transfers: 0-Activity did not occur  FIM - Control and instrumentation engineer Devices: Bed rails Bed/Chair Transfer: 0: Activity did not occur  FIM - Locomotion: Wheelchair Locomotion: Wheelchair: 1: Total Assistance/staff pushes wheelchair (Pt<25%) FIM - Locomotion: Ambulation Locomotion: Ambulation Assistive Devices: Administrator Ambulation/Gait Assistance: 4: Min assist Locomotion: Ambulation: 1: Travels less than 50 ft with minimal assistance (Pt.>75%)  Comprehension Comprehension Mode: Auditory Comprehension: 1-Understands basic less than 25% of the time/requires cueing 75% of the time  Expression Expression Mode: Verbal Expression: 1-Expresses basis less than 25% of the time/requires cueing greater than 75% of the time.  Social Interaction Social Interaction Mode: Asleep Social Interaction: 2-Interacts appropriately 25 - 49% of time - Needs frequent redirection.  Problem Solving Problem Solving: 1-Solves basic less than 25% of the time - needs direction nearly all the time or does not effectively solve problems and may need a restraint for safety  Memory Memory: 1-Recognizes or recalls less than 25% of the time/requires cueing greater than 75% of the time  2. Anticoagulation/DVT prophylaxis with Pharmaceutical: Heparin  Medical Problem List and Plan:  1. lumbar epidural hematoma with paraparesis. 4 level laminectomy and evacuation of lumbar L2-3 epidural hematoma  01/05/2012  2. DVT Prophylaxis/Anticoagulation: Subcutaneous heparin. Monitor platelet counts and any signs of bleeding  3. Pain Management: will use tylenol due to AMS.   4. abdominal aortic aneurysm/thoraco abdominal aneurysm. Endovascularly repaired 01/01/2012. Will need followup at [facility]  5. Chronic atrial fibrillation. Cardiac rate control. Continue atenolol 50 mg daily. Coumadin and aspirin remain on hold for one month from date of surgery 01/05/2012 due to epidural hematoma  6. Urinary retention. Requiring occasional I/O cath. ucx pending. 7. Congestive heart failure. Lasix held for low Na, cont losartan Monitor for any signs of fluid overload  8. Hyponatremia. Lasix held. Sodium hovering in mid 120's 9. COPD. Continue Spiriva and Flonase.  10. Hypertension: Continue losartan 100 mg daily. Monitor for any signs of orthostasis  11.  Somnolence/confusion: still confused  -pseudomonas UTI- cipro sensitive. Seems to be showing improvement  -sodium lower today (124) but close to baseline.   -continue FR and follow labs serially 11. Hyperlipidemia.atorvastatin    LOS (Days) 11 A FACE TO FACE EVALUATION WAS PERFORMED  Deshante Cassell T 01/22/2012, 7:23 AM

## 2012-01-22 NOTE — Progress Notes (Addendum)
Physical Therapy Session Note  Patient Details  Name: Ryan Hess MRN: [id number] Date of Birth: [birth year]  Today's Date: 01/22/2012 Time: [id number]  Short Term Goals: Week 2:  PT Short Term Goal 1 (Week 2): Pt will be able to transfer min A with RW  PT Short Term Goal 2 (Week 2): Pt will be able to gait with RW x 50' with min A PT Short Term Goal 3 (Week 2): Pt will be able to do curb step with RW to simulate home entry with mod A  Skilled Therapeutic Interventions/Progress Updates:    Incontinent of bowel during session but pt aware this time. Decreased activity tolerance, very fatigued and closing eyes in w/c towards end of session but this was third therapy of the morning.  Pt assisted back to bed and son was educated on pt session and status.  Pt unable to remove shoes at end of session d/t severe fatigue. Tried to write a sign on front of walker for hand placement to stand to RW with poor results, needs total instructional cues and still does best with 1 hand on arm rest and one on RW>  Gait with total instructional cues with constant repetition to move feet apart as they are so close they kick each othe (on Friday unable to fix with cues, did try x 2 today but it didn't last)r, pt tends to look down  Therapy Documentation Precautions:  Precautions Precautions: Fall Precaution Comments: O2 Restrictions Weight Bearing Restrictions: No General:   Vital Signs: Therapy Vitals Pulse Rate: 44  BP: 98/59 mmHg Patient Position, if appropriate:  (74/43 standing) Oxygen Therapy SpO2: 92 % O2 Device: Nasal cannula O2 Flow Rate (L/min): 2 L/min Pain: Pain Assessment Pain Assessment: No/denies pain Mobility: Transfers Sit to Stand: 4: Min assist (written instruction for technique did not help) Sit to Stand Details: Verbal cues for technique;Verbal cues for precautions/safety Stand to Sit: 5: Supervision Locomotion : Stairs / Additional Locomotion Stairs: Yes Stairs  Assistance: 4: Min Armed forces technical officer Details (indicate cue type and reason): reciproacal and step to Stair Management Technique: Two rails Number of Stairs: 5           Other Treatments:  car transfer stand pivot with RW instructional cues min A  See FIM for current functional status  Therapy/Group: Individual Therapy Othelia Pulling 01/22/2012, 10:26 AM

## 2012-01-23 ENCOUNTER — Inpatient Hospital Stay (HOSPITAL_COMMUNITY): Payer: Medicare Other

## 2012-01-23 DIAGNOSIS — R339 Retention of urine, unspecified: Secondary | ICD-10-CM

## 2012-01-23 DIAGNOSIS — Z5189 Encounter for other specified aftercare: Secondary | ICD-10-CM

## 2012-01-23 DIAGNOSIS — G8220 Paraplegia, unspecified: Secondary | ICD-10-CM

## 2012-01-23 DIAGNOSIS — M48061 Spinal stenosis, lumbar region without neurogenic claudication: Secondary | ICD-10-CM

## 2012-01-23 DIAGNOSIS — R4182 Altered mental status, unspecified: Secondary | ICD-10-CM

## 2012-01-23 DIAGNOSIS — N390 Urinary tract infection, site not specified: Secondary | ICD-10-CM

## 2012-01-23 LAB — CORTISOL-AM, BLOOD: Cortisol - AM: 16.5 ug/dL (ref 4.3–22.4)

## 2012-01-23 LAB — BASIC METABOLIC PANEL
BUN: 20 mg/dL (ref 6–23)
CO2: 24 mEq/L (ref 19–32)
Calcium: 9.2 mg/dL (ref 8.4–10.5)
Chloride: 96 mEq/L (ref 96–112)
Creatinine, Ser: 0.99 mg/dL (ref 0.50–1.35)
GFR calc Af Amer: 83 mL/min — ABNORMAL LOW (ref >90)
GFR calc non Af Amer: 72 mL/min — ABNORMAL LOW (ref >90)
Glucose, Bld: 97 mg/dL (ref 70–99)
Potassium: 3.9 mEq/L (ref 3.5–5.1)
Sodium: 131 mEq/L — ABNORMAL LOW (ref 135–145)

## 2012-01-23 LAB — COMPREHENSIVE METABOLIC PANEL
ALT: 31 U/L (ref 0–53)
AST: 35 U/L (ref 0–37)
Albumin: 2.8 g/dL — ABNORMAL LOW (ref 3.5–5.2)
Alkaline Phosphatase: 183 U/L — ABNORMAL HIGH (ref 39–117)
BUN: 20 mg/dL (ref 6–23)
CO2: 22 mEq/L (ref 19–32)
Calcium: 8.9 mg/dL (ref 8.4–10.5)
Chloride: 96 mEq/L (ref 96–112)
Creatinine, Ser: 1.03 mg/dL (ref 0.50–1.35)
GFR calc Af Amer: 74 mL/min — ABNORMAL LOW (ref >90)
GFR calc non Af Amer: 64 mL/min — ABNORMAL LOW (ref >90)
Glucose, Bld: 85 mg/dL (ref 70–99)
Potassium: 4 mEq/L (ref 3.5–5.1)
Sodium: 131 mEq/L — ABNORMAL LOW (ref 135–145)
Total Bilirubin: 1.4 mg/dL — ABNORMAL HIGH (ref 0.3–1.2)
Total Protein: 5.8 g/dL — ABNORMAL LOW (ref 6.0–8.3)

## 2012-01-23 LAB — CBC
HCT: 38.7 % — ABNORMAL LOW (ref 39.0–52.0)
Hemoglobin: 13 g/dL (ref 13.0–17.0)
MCH: 31.1 pg (ref 26.0–34.0)
MCHC: 33.6 g/dL (ref 30.0–36.0)
MCV: 92.6 fL (ref 78.0–100.0)
Platelets: 243 10*3/uL (ref 150–400)
RBC: 4.18 MIL/uL — ABNORMAL LOW (ref 4.22–5.81)
RDW: 15.3 % (ref 11.5–15.5)
WBC: 8.2 10*3/uL (ref 4.0–10.5)

## 2012-01-23 LAB — T4, FREE: Free T4: 1.17 ng/dL (ref 0.80–1.80)

## 2012-01-23 LAB — T3: T3, Total: 65.2 ng/dl — ABNORMAL LOW (ref 80.0–204.0)

## 2012-01-23 LAB — T4: T4, Total: 8.6 ug/dL (ref 5.0–12.5)

## 2012-01-23 LAB — TSH: TSH: 7.052 u[IU]/mL — ABNORMAL HIGH (ref 0.350–4.500)

## 2012-01-23 LAB — VITAMIN B12: Vitamin B-12: 821 pg/mL (ref 211–911)

## 2012-01-23 NOTE — Progress Notes (Signed)
Occupational Therapy Session Note  Patient Details  Name: Ryan Hess MRN: [id number] Date of Birth: [birth year]  Today's Date: 01/23/2012  Short Term Goals: Week 1:  OT Short Term Goal 1 (Week 1): Patient will perform LB dressing with min assist using AE prn OT Short Term Goal 1 - Progress (Week 1): Not met OT Short Term Goal 2 (Week 1): Patient will perform toilet transfers with min assist using AD and DME prn OT Short Term Goal 2 - Progress (Week 1): Met OT Short Term Goal 3 (Week 1): Patient will perform tub/shower transfer with min assist using DME prn OT Short Term Goal 3 - Progress (Week 1): Not met OT Short Term Goal 4 (Week 1): Patient will perform grooming tasks independently (LTG) OT Short Term Goal 4 - Progress (Week 1): Not met  Week 2:  OT Short Term Goal 1 (Week 2): Patient will perform LB dressing with min assist using AE prn OT Short Term Goal 2 (Week 2): Patient will perform grooming tasks independently (LTG) OT Short Term Goal 3 (Week 2): Patient will perform tub/shower transfer with min assist using AD and DME prn OT Short Term Goal 4 (Week 2): Patient will maintain dynamic standing balance during functional tasks using AD prn with supervision  Skilled Therapeutic Interventions/Progress Updates:  1015-1055 - 40 Minutes No complaints of pain Upon entering room patient seated on elevated toilet seat with physical therapist present in room. Patient performed perineal hygiene and able to adjust clothing prior and post using restroom with steady assist from therapist. Ambulated from elevated toilet seat -> w/c that was placed near sink with steady assist using rolling walker. Patient then engaged in grooming tasks seated at sink in w/c with supervision for 3 tasks and needed assist with 1/4 tasks. Patient performed UB/LB bathing and dressing in sit->stand position; UB at supervision with max verbal cues and LB with total assist. Focused skilled intervention on overall  awareness, initiation, overall activity tolerance/endurance, sit/stands, dynamic standing balance/tolerance/endurance. Therapist left patient seated in w/c with quick-release belt donned and call bell & phone within reach.  Precautions:  Precautions Precautions: Fall Precaution Comments: O2 Restrictions Weight Bearing Restrictions: No  See FIM for current functional status  Therapy/Group: Individual Therapy  Allesha Aronoff 01/23/2012, 3:03 PM

## 2012-01-23 NOTE — Progress Notes (Signed)
Occupational Therapy Note  Patient Details  Name: Ryan Hess MRN: [id number] Date of Birth: 22-May-1924 Today's Date: 01/23/2012  Patient missed 30 minutes of skilled occupational therapy. Upon entering room patient supine in bed asleep. Therapist attempted to wake patient for therapy session. Patient opened eyes and immediately started reaching out and grabbing in the air, therapist then attempted to arouse/orient patient from sleep/?hallucination?. At one point patient tried grabbing for therapist and gritting teeth at same time. RN notified of behaviors. Recommend nursing attempt to assist patient -> w/c or recliner for dinner and to sit up awhile before bed time. Per RN, patient didn't sleep well last night; awake most of the night.   Parmvir Boomer 01/23/2012, 3:11 PM

## 2012-01-23 NOTE — Progress Notes (Signed)
Physical Therapy Session Note  Patient Details  Name: Ryan Hess MRN: [id number] Date of Birth: November 15, 1923  Today's Date: 01/23/2012 Time: 8413-2440 Time Calculation (min): 58 min  Short Term Goals: Week 2:  PT Short Term Goal 1 (Week 2): Pt will be able to transfer min A with RW  PT Short Term Goal 2 (Week 2): Pt will be able to gait with RW x 50' with min A PT Short Term Goal 3 (Week 2): Pt will be able to do curb step with RW to simulate home entry with mod A  Skilled Therapeutic Interventions/Progress Updates: Pt alert (intermittently lethargic where he would close his eyes), but disoriented and confused. Focus on participation for functional tasks: gait with RW with mod A x 75', total cueing for safety with AD with goal to "find the living room" per patient request. Once fatigued, used w/c mobility to continue the search for functional mobility and general activity tolerance, pt checking rooms but unable to identify ADL apartment/living room when brought into the room. In the gym pt reports there are couches and comfortable chairs in there, unable to determine that exercise equipment should not be in living room. Pt also with reports of seeing bugs crawling on the floor. Seated task to put together PVC pipe since pt with interest in building things, pt able to put parts together initially needing hand over hand assist with resemblance to the picture but not accurately attaching it to the stand. Again, pt falling asleep and assisted back to bed with min A to rest.      Therapy Documentation Precautions:  Precautions Precautions: Fall Precaution Comments: O2 Restrictions Weight Bearing Restrictions: No   Pain:  No complaints.  See FIM for current functional status  Therapy/Group: Individual Therapy  Karolee Stamps [facility] 01/23/2012, 2:11 PM

## 2012-01-23 NOTE — Progress Notes (Signed)
Physical Therapy Session Note  Patient Details  Name: Ryan Hess MRN: [id number] Date of Birth: 1924-03-31  Today's Date: 01/23/2012 Time: 9:22-10:15 ( ) Short Term Goals: Week 2:  PT Short Term Goal 1 (Week 2): Pt will be able to transfer min A with RW  PT Short Term Goal 2 (Week 2): Pt will be able to gait with RW x 50' with min A PT Short Term Goal 3 (Week 2): Pt will be able to do curb step with RW to simulate home entry with mod A  Skilled Therapeutic Interventions/Progress Updates: Tx focused on short distance gait with RW for functional tasks, transfer training with RW <>various surfaces, and static and dynamic balance. Gait in room with RW as pt searched drawers for clothing. Pt needed cues to come closer to dresser as he used it for support with 1 UE while reaching down to lowest drawer. Gait 3x10' in room with Mod A for turns and navigating obstacles. Pt needed cues for moving more slowly.   Pt propelled WCx75' with S and good navigation in busy environment. Sit<>stand from [facility] with Min A x4, from lower furniture with Mod A x5 and cues for safe hand placement 50% of the time going to stand and 100% of the time going to sit. Gait on carpet 2x30' with cues for posture and increasing step width with Min A overall. Pt was able to adjust gait quality momentarily, but not maintain. At one point pt stepped on own sock, and was able to catch his balance with RW, stop, and adjust step with Min A for steadying, demonstrating some emergent awareness.  Static standing balance, dual tasking with stating personal information when asked, which continues to be a challenge for pt. Static standing x60 sec with no UE assist, cone tapping 2x10 each with RW and rest breaks between. Pt had difficulty alternating steps, even with cues, continuing to tap same foot several times before switching.  Pt incontinent of bowels this tx, of which he was aware. Toilet transfer with Min A and step-by-step cues for  sequence and hand placement.      Therapy Documentation Precautions:  Precautions Precautions: Fall Precaution Comments: O2 Restrictions Weight Bearing Restrictions: No   See FIM for current functional status  Therapy/Group: Individual Therapy  Virl Cagey, PT 01/23/2012, 10:11 AM

## 2012-01-23 NOTE — Progress Notes (Signed)
Subjective/Complaints: Review of Systems  All other systems reviewed and are negative.  had been showing gradual progress over the last several days. Last night up most of the night. Confused this am   Objective: Vital Signs: Blood pressure 130/70, pulse 68, temperature 96.6 F (35.9 C), temperature source Axillary, resp. rate 16, height 5\' 9"  (1.753 m), weight 83.734 kg (184 lb 9.6 oz), SpO2 100.00%. No results found.    Alert-slow to initiate early this am HEENT: PERRL, EOMI HRT: RRR, no M,R,G CHEST: clear with w,r,r, Abd: soft, non tender, BS + Neuro: moves all exts', alert to name only, speech incoherent. Strength and sensory exam stable. Wound clean and intact with sutures    Assessment/Plan: 1. Functional deficits secondary to Paraparesis due to epidural hematoma L2,L3 compression which require 3+ hours per day of interdisciplinary therapy in a comprehensive inpatient rehab setting. Physiatrist is providing close team supervision and 24 hour management of active medical problems listed below. Physiatrist and rehab team continue to assess barriers to discharge/monitor patient progress toward functional and medical goals.  ?SNF  FIM: FIM - Bathing Bathing Steps Patient Completed: Chest;Right Arm;Left Arm;Abdomen;Right upper leg;Left upper leg;Front perineal area;Buttocks Bathing: 4: Min-Patient completes 8-9 53f 10 parts or 75+ percent  FIM - Upper Body Dressing/Undressing Upper body dressing/undressing steps patient completed: Thread/unthread right sleeve of pullover shirt/dresss;Thread/unthread left sleeve of pullover shirt/dress;Put head through opening of pull over shirt/dress;Pull shirt over trunk Upper body dressing/undressing: 5: Supervision: Safety issues/verbal cues FIM - Lower Body Dressing/Undressing Lower body dressing/undressing steps patient completed: Pull pants up/down Lower body dressing/undressing: 1: Total-Patient completed less than 25% of  tasks  FIM - Toileting Toileting steps completed by patient: Performs perineal hygiene Toileting: 2: Max-Patient completed 1 of 3 steps  FIM - Diplomatic Services operational officer Devices: Art gallery manager Transfers: 4-To toilet/BSC: Min A (steadying Pt. > 75%);4-From toilet/BSC: Min A (steadying Pt. > 75%)  FIM - Bed/Chair Transfer Bed/Chair Transfer Assistive Devices: Bed rails Bed/Chair Transfer: 1: Two helpers  FIM - Locomotion: Wheelchair Locomotion: Wheelchair: 2: Travels 50 - 149 ft with maximal assistance (Pt: 25 - 49%) FIM - Locomotion: Ambulation Locomotion: Ambulation Assistive Devices: Designer, industrial/product Ambulation/Gait Assistance: 3: Mod assist Locomotion: Ambulation: 1: Two helpers  Comprehension Comprehension Mode: Auditory Comprehension: 2-Understands basic 25 - 49% of the time/requires cueing 51 - 75% of the time  Expression Expression Mode: Verbal Expression: 2-Expresses basic 25 - 49% of the time/requires cueing 50 - 75% of the time. Uses single words/gestures.  Social Interaction Social Interaction Mode: Asleep Social Interaction: 2-Interacts appropriately 25 - 49% of time - Needs frequent redirection.  Problem Solving Problem Solving: 1-Solves basic less than 25% of the time - needs direction nearly all the time or does not effectively solve problems and may need a restraint for safety  Memory Memory: 1-Recognizes or recalls less than 25% of the time/requires cueing greater than 75% of the time  2. Anticoagulation/DVT prophylaxis with Pharmaceutical: Heparin  Medical Problem List and Plan:  1. lumbar epidural hematoma with paraparesis. 4 level laminectomy and evacuation of lumbar L2-3 epidural hematoma 01/05/2012  2. DVT Prophylaxis/Anticoagulation: Subcutaneous heparin. Monitor platelet counts and any signs of bleeding  3. Pain Management: will use tylenol due to AMS.   4. abdominal aortic aneurysm/thoraco abdominal aneurysm. Endovascularly  repaired 01/01/2012. [facility] at [facility]  5. Chronic atrial fibrillation. Cardiac rate control. Continue atenolol 50 mg daily. Coumadin and aspirin remain on hold for one month from date of surgery  01/05/2012 due to epidural hematoma  6. Urinary retention. Requiring intermittent caths still. May need to consider foley at discharge. uro f/u as an outpt 7. Congestive heart failure. Lasix held for low Na, cont losartan Monitoring for any signs of fluid overload  8. Hyponatremia. Lasix held. Sodium improving. Recheck tomorrow 9. COPD. Continue Spiriva and Flonase.  10. Hypertension: Continue losartan 100 mg daily. Monitor for any signs of orthostasis  11.  Somnolence/confusion: more confused last night after showing improvement for several days  -pseudomonas UTI- cipro   -sodium trending upward- recheck labs today  -check b12, folate, tsh, am cortisol and bmet/cbc  -check CT of the head  -all sedating meds held  -relax FR and follow labs serially-dc daily bmet    11. Hyperlipidemia.atorvastatin    A FACE TO FACE EVALUATION WAS PERFORMED  Ryan Hess T              more appropriate today. Still a little slow to arouse. Follows simple command with better awareness.  Objective: Vital Signs: Blood pressure 108/65, pulse 54, temperature 97.8 F (36.6 C), temperature source Oral, resp. rate 18, height 5\' 9"  (1.753 m), weight 83.734 kg (184 lb 9.6 oz), SpO2 98.00%. No results found. Results for orders placed during the hospital encounter of 01/11/12 (from the past 72 hour(s))  GLUCOSE, CAPILLARY     Status: Abnormal   Collection Time   01/20/12  7:27 AM      Component Value Range Comment   Glucose-Capillary 102 (*) 70 - 99 (mg/dL)   GLUCOSE, CAPILLARY     Status: Abnormal   Collection Time   01/20/12 11:36 AM      Component Value Range Comment   Glucose-Capillary 113 (*) 70 - 99 (mg/dL)   BASIC METABOLIC PANEL     Status: Abnormal   Collection Time   01/20/12   4:38 PM      Component Value Range Comment   Sodium 126 (*) 135 - 145 (mEq/L)    Potassium 4.9  3.5 - 5.1 (mEq/L)    Chloride 95 (*) 96 - 112 (mEq/L)    CO2 24  19 - 32 (mEq/L)    Glucose, Bld 95  70 - 99 (mg/dL)    BUN 15  6 - 23 (mg/dL)    Creatinine, Ser 1.61  0.50 - 1.35 (mg/dL) DELTA CHECK NOTED   Calcium 8.5  8.4 - 10.5 (mg/dL)    GFR calc non Af Amer 73 (*) >90 (mL/min)    GFR calc Af Amer 84 (*) >90 (mL/min)   GLUCOSE, CAPILLARY     Status: Abnormal   Collection Time   01/20/12  4:52 PM      Component Value Range Comment   Glucose-Capillary 103 (*) 70 - 99 (mg/dL)   GLUCOSE, CAPILLARY     Status: Abnormal   Collection Time   01/20/12  7:55 PM      Component Value Range Comment   Glucose-Capillary 140 (*) 70 - 99 (mg/dL)    Comment 1 Notify RN     BASIC METABOLIC PANEL     Status: Abnormal   Collection Time   01/21/12  5:45 AM      Component Value Range Comment   Sodium 128 (*) 135 - 145 (mEq/L)    Potassium 4.3  3.5 - 5.1 (mEq/L)    Chloride 95 (*) 96 - 112 (mEq/L)    CO2 24  19 - 32 (mEq/L)    Glucose, Bld 85  70 - 99 (  mg/dL)    BUN 14  6 - 23 (mg/dL)    Creatinine, Ser 1.47  0.50 - 1.35 (mg/dL)    Calcium 8.5  8.4 - 10.5 (mg/dL)    GFR calc non Af Amer 76 (*) >90 (mL/min)    GFR calc Af Amer 88 (*) >90 (mL/min)   GLUCOSE, CAPILLARY     Status: Normal   Collection Time   01/21/12  7:41 AM      Component Value Range Comment   Glucose-Capillary 86  70 - 99 (mg/dL)    Comment 1 Notify RN      Comment 2 Documented in Chart     GLUCOSE, CAPILLARY     Status: Abnormal   Collection Time   01/21/12 11:18 AM      Component Value Range Comment   Glucose-Capillary 107 (*) 70 - 99 (mg/dL)    Comment 1 Notify RN      Comment 2 Documented in Chart     GLUCOSE, CAPILLARY     Status: Abnormal   Collection Time   01/21/12  5:01 PM      Component Value Range Comment   Glucose-Capillary 106 (*) 70 - 99 (mg/dL)    Comment 1 Notify RN     GLUCOSE, CAPILLARY     Status:  Abnormal   Collection Time   01/21/12  8:13 PM      Component Value Range Comment   Glucose-Capillary 108 (*) 70 - 99 (mg/dL)    Comment 1 Notify RN           Assessment/Plan: 1. Functional deficits secondary to Paraparesis due to epidural hematoma L2,L3 compression which require 3+ hours per day of interdisciplinary therapy in a comprehensive inpatient rehab setting. Physiatrist is providing close team supervision and 24 hour management of active medical problems listed below. Physiatrist and rehab team continue to assess barriers to discharge/monitor patient progress toward functional and medical goals. FIM: FIM - Bathing Bathing Steps Patient Completed: Chest;Right Arm;Left Arm;Front perineal area;Abdomen;Right upper leg;Left upper leg Bathing: 3: Mod-Patient completes 5-7 43f 10 parts or 50-74%  FIM - Upper Body Dressing/Undressing Upper body dressing/undressing steps patient completed: Thread/unthread left sleeve of pullover shirt/dress;Put head through opening of pull over shirt/dress;Pull shirt over trunk;Thread/unthread right sleeve of pullover shirt/dresss Upper body dressing/undressing: 5: Supervision: Safety issues/verbal cues FIM - Lower Body Dressing/Undressing Lower body dressing/undressing steps patient completed: Thread/unthread right pants leg Lower body dressing/undressing: 1: Total-Patient completed less than 25% of tasks  FIM - Toileting Toileting steps completed by patient: Adjust clothing prior to toileting Toileting Assistive Devices: Grab bar or rail for support Toileting: 3: Mod-Patient completed 2 of 3 steps  FIM - Diplomatic Services operational officer Devices: Grab bars Toilet Transfers: 4-From toilet/BSC: Min A (steadying Pt. > 75%)  FIM - Banker Devices: Walker;Arm rests Bed/Chair Transfer: 5: Sit > Supine: Supervision (verbal cues/safety issues);4: Chair or W/C > Bed: Min A (steadying Pt. > 75%)  FIM -  Locomotion: Wheelchair Locomotion: Wheelchair: 1: Total Assistance/staff pushes wheelchair (Pt<25%) FIM - Locomotion: Ambulation Locomotion: Ambulation Assistive Devices: Designer, industrial/product Ambulation/Gait Assistance: 4: Min assist Locomotion: Ambulation: 2: Travels 50 - 149 ft with minimal assistance (Pt.>75%) (100 ft)  Comprehension Comprehension Mode: Auditory Comprehension: 4-Understands basic 75 - 89% of the time/requires cueing 10 - 24% of the time  Expression Expression Mode: Verbal Expression: 2-Expresses basic 25 - 49% of the time/requires cueing 50 - 75% of the time. Uses single words/gestures.  Social Interaction Social Interaction Mode: Asleep Social Interaction: 4-Interacts appropriately 75 - 89% of the time - Needs redirection for appropriate language or to initiate interaction.  Problem Solving Problem Solving: 2-Solves basic 25 - 49% of the time - needs direction more than half the time to initiate, plan or complete simple activities  Memory Memory: 2-Recognizes or recalls 25 - 49% of the time/requires cueing 51 - 75% of the time  2. Anticoagulation/DVT prophylaxis with Pharmaceutical: Heparin  Medical Problem List and Plan:  1. lumbar epidural hematoma with paraparesis. 4 level laminectomy and evacuation of lumbar L2-3 epidural hematoma 01/05/2012  2. DVT Prophylaxis/Anticoagulation: Subcutaneous heparin. Monitor platelet counts and any signs of bleeding  3. Pain Management: will use tylenol due to AMS.   4. abdominal aortic aneurysm/thoraco abdominal aneurysm. Endovascularly repaired 01/01/2012. Will need followup at [facility]  5. Chronic atrial fibrillation. Cardiac rate control. Continue atenolol 50 mg daily. Coumadin and aspirin remain on hold for one month from date of surgery 01/05/2012 due to epidural hematoma  6. Urinary retention. Requiring occasional I/O cath. ucx pending. 7. Congestive heart failure. Lasix held for low Na, cont losartan Monitor for any  signs of fluid overload  8. Hyponatremia. Lasix held. Sodium hovering in mid 120's 9. COPD. Continue Spiriva and Flonase.  10. Hypertension: Continue losartan 100 mg daily. Monitor for any signs of orthostasis  11.  Somnolence/confusion: still confused  -pseudomonas UTI- cipro sensitive. Seems to be showing improvement  -sodium lower today (124) but close to baseline.   -continue FR and follow labs serially 11. Hyperlipidemia.atorvastatin    LOS (Days) 12 A FACE TO FACE EVALUATION WAS PERFORMED  Ryan Hess T 01/23/2012, 7:08 AM

## 2012-01-24 DIAGNOSIS — N390 Urinary tract infection, site not specified: Secondary | ICD-10-CM

## 2012-01-24 DIAGNOSIS — G8220 Paraplegia, unspecified: Secondary | ICD-10-CM

## 2012-01-24 DIAGNOSIS — R339 Retention of urine, unspecified: Secondary | ICD-10-CM

## 2012-01-24 DIAGNOSIS — R4182 Altered mental status, unspecified: Secondary | ICD-10-CM

## 2012-01-24 DIAGNOSIS — M48061 Spinal stenosis, lumbar region without neurogenic claudication: Secondary | ICD-10-CM

## 2012-01-24 DIAGNOSIS — Z5189 Encounter for other specified aftercare: Secondary | ICD-10-CM

## 2012-01-24 LAB — FOLATE RBC: RBC Folate: 1511 ng/mL — ABNORMAL HIGH (ref >=366)

## 2012-01-24 NOTE — Progress Notes (Signed)
Patient ID: Ryan Hess, male   DOB: Feb 23, 1924, 78 y.o.   MRN: [id number]  Subjective/Complaints: Review of Systems  All other systems reviewed and are negative.  better night. Still disoriented but more appropriate and attentive early this am.   Objective: Vital Signs: Blood pressure 130/70, pulse 68, temperature 96.6 F (35.9 C), temperature source Axillary, resp. rate 16, height '5\' 9"'$  (1.753 m), weight 83.734 kg (184 lb 9.6 oz), SpO2 100.00%. No results found.    Alert-slow to initiate early this am HEENT: PERRL, EOMI HRT: RRR, no M,R,G CHEST: clear with w,r,r, Abd: soft, non tender, BS + Neuro: moves all exts', alert to name only, speech incoherent. Strength and sensory exam stable with equal strength all 4's Wound clean and intact with sutures    Assessment/Plan: 1. Functional deficits secondary to Paraparesis due to epidural hematoma L2,L3 compression which require 3+ hours per day of interdisciplinary therapy in a comprehensive inpatient rehab setting. Physiatrist is providing close team supervision and 24 hour management of active medical problems listed below. Physiatrist and rehab team continue to assess barriers to discharge/monitor patient progress toward functional and medical goals.  ?SNF  FIM: FIM - Bathing Bathing Steps Patient Completed: Chest;Right Arm;Left Arm;Abdomen;Right upper leg;Left upper leg;Front perineal area;Buttocks Bathing: 4: Min-Patient completes 8-9 10f10 parts or 75+ percent  FIM - Upper Body Dressing/Undressing Upper body dressing/undressing steps patient completed: Thread/unthread right sleeve of pullover shirt/dresss;Thread/unthread left sleeve of pullover shirt/dress;Put head through opening of pull over shirt/dress;Pull shirt over trunk Upper body dressing/undressing: 5: Supervision: Safety issues/verbal cues FIM - Lower Body Dressing/Undressing Lower body dressing/undressing steps patient completed: Pull pants up/down Lower body  dressing/undressing: 1: Total-Patient completed less than 25% of tasks  FIM - Toileting Toileting steps completed by patient: Performs perineal hygiene Toileting: 2: Max-Patient completed 1 of 3 steps  FIM - TRadio producerDevices: WInsurance account managerTransfers: 4-To toilet/BSC: Min A (steadying Pt. > 75%);4-From toilet/BSC: Min A (steadying Pt. > 75%)  FIM - Bed/Chair Transfer Bed/Chair Transfer Assistive Devices: Bed rails Bed/Chair Transfer: 1: Two helpers  FIM - Locomotion: Wheelchair Locomotion: Wheelchair: 2: Travels 50 - 149 ft with maximal assistance (Pt: 25 - 49%) FIM - Locomotion: Ambulation Locomotion: Ambulation Assistive Devices: WAdministratorAmbulation/Gait Assistance: 3: Mod assist Locomotion: Ambulation: 1: Two helpers  Comprehension Comprehension Mode: Auditory Comprehension: 2-Understands basic 25 - 49% of the time/requires cueing 51 - 75% of the time  Expression Expression Mode: Verbal Expression: 2-Expresses basic 25 - 49% of the time/requires cueing 50 - 75% of the time. Uses single words/gestures.  Social Interaction Social Interaction Mode: Asleep Social Interaction: 2-Interacts appropriately 25 - 49% of time - Needs frequent redirection.  Problem Solving Problem Solving: 1-Solves basic less than 25% of the time - needs direction nearly all the time or does not effectively solve problems and may need a restraint for safety  Memory Memory: 1-Recognizes or recalls less than 25% of the time/requires cueing greater than 75% of the time  2. Anticoagulation/DVT prophylaxis with Pharmaceutical: Heparin  Medical Problem List and Plan:  1. lumbar epidural hematoma with paraparesis. 4 level laminectomy and evacuation of lumbar L2-3 epidural hematoma 01/05/2012  2. DVT Prophylaxis/Anticoagulation: Subcutaneous heparin. Monitor platelet counts and any signs of bleeding  3. Pain Management: will use tylenol due to AMS.   4. abdominal  aortic aneurysm/thoraco abdominal aneurysm. Endovascularly repaired 01/01/2012. [facility] 5. Chronic atrial fibrillation. Cardiac rate control. Continue atenolol 50 mg  daily. Coumadin and aspirin remain on hold for one month from date of surgery 01/05/2012 due to epidural hematoma  6. Urinary retention. Requiring intermittent caths still. May need to consider foley at discharge. uro f/u as an outpt 7. Congestive heart failure. Lasix held for low Na, cont losartan Monitoring for any signs of fluid overload  8. Hyponatremia. Lasix held. Sodium improving. Recheck tomorrow 9. COPD. Continue Spiriva and Flonase.  10. Hypertension: Continue losartan 100 mg daily. Monitor for any signs of orthostasis  11.  Somnolence/confusion: more confused last night after showing improvement for several days  -pseudomonas UTI- cipro   -sodium trending upward-   -only lab abnl is an elevated TSH. Even with the normal T4 this could be a sign of hypothyroid state. Will check with endocrine or IM  -CT of head shows atrophy and SVD consistent with his age really  -all sedating meds held  -relaxed FR     LOS (Days) 13 A FACE TO FACE EVALUATION WAS PERFORMED  Annibelle Brazie T 01/24/2012, 6:51 AM

## 2012-01-24 NOTE — Progress Notes (Signed)
Occupational Therapy Session Note  Patient Details  Name: Ryan Hess MRN: [id number] Date of Birth: [birth year]  Today's Date: 01/24/2012 Time: 4098-1191 Time Calculation (min): 60 min  Short Term Goals: Week 1:  OT Short Term Goal 1 (Week 1): Patient will perform LB dressing with min assist using AE prn OT Short Term Goal 1 - Progress (Week 1): Not met OT Short Term Goal 2 (Week 1): Patient will perform toilet transfers with min assist using AD and DME prn OT Short Term Goal 2 - Progress (Week 1): Met OT Short Term Goal 3 (Week 1): Patient will perform tub/shower transfer with min assist using DME prn OT Short Term Goal 3 - Progress (Week 1): Not met OT Short Term Goal 4 (Week 1): Patient will perform grooming tasks independently (LTG) OT Short Term Goal 4 - Progress (Week 1): Not met  Week 2:  OT Short Term Goal 1 (Week 2): Patient will perform LB dressing with min assist using AE prn OT Short Term Goal 2 (Week 2): Patient will perform grooming tasks independently (LTG) OT Short Term Goal 3 (Week 2): Patient will perform tub/shower transfer with min assist using AD and DME prn OT Short Term Goal 4 (Week 2): Patient will maintain dynamic standing balance during functional tasks using AD prn with supervision  Skilled Therapeutic Interventions/Progress Updates:  No complaints of pain during session. Upon entering room patient seated in w/c with quick release belt maneuvering around room stating "I'm just wandering around". Engaged in functional ambulation -> tub transfer bench in room shower for ADL retraining at shower level. Focused skilled intervention on overall awareness, initiation throughout tasks, memory, UB/LB bathing & dressing, sit/stands, overall activity tolerance/endurance, toilet transfer from elevated toilet seat, toileting (peri hygiene & clothing management), and grooming tasks seated at sink in w/c. Left patient seated in w/c with quick release belt donned.    Precautions:  Precautions Precautions: Fall Precaution Comments: O2 Restrictions Weight Bearing Restrictions: No  See FIM for current functional status  Therapy/Group: Individual Therapy  [name] 01/24/2012, 10:16 AM

## 2012-01-24 NOTE — Progress Notes (Signed)
Nutrition Follow-up  Intake variable, will eat up to 100%. Intake this morning poor, per tech ate 10%. Pt states his appetite is great; remains disoriented. When showing him Ensure, he was able to identify it and said that he liked to drink it.  Diet Order:  Regular Supplements: Ensure Complete PO daily  Meds: Scheduled Meds:   . atenolol  50 mg Oral Daily  . atorvastatin  10 mg Oral q1800  . ciprofloxacin  500 mg Oral BID  . feeding supplement  237 mL Oral Q24H  . fluticasone  1 spray Each Nare BID  . guaiFENesin  1,200 mg Oral BID  . heparin subcutaneous  5,000 Units Subcutaneous Q8H  . losartan  100 mg Oral Daily  . mulitivitamin with minerals  1 tablet Oral Daily  . potassium chloride  10 mEq Oral Daily  . senna  1 tablet Oral Daily  . Tamsulosin HCl  0.4 mg Oral QPC supper  . tiotropium  18 mcg Inhalation Daily   Continuous Infusions:   . sodium chloride 75 mL/hr at 01/20/12 1332   PRN Meds:.acetaminophen, polyethylene glycol, sorbitol  Labs:  CMP     Component Value Date/Time   NA 131* 01/23/2012 0918   K 4.0 01/23/2012 0918   CL 96 01/23/2012 0918   CO2 22 01/23/2012 0918   GLUCOSE 85 01/23/2012 0918   BUN 20 01/23/2012 0918   CREATININE 1.03 01/23/2012 0918   CREATININE 1.30 08/29/2011 1147   CALCIUM 8.9 01/23/2012 0918   PROT 5.8* 01/23/2012 0918   ALBUMIN 2.8* 01/23/2012 0918   AST 35 01/23/2012 0918   ALT 31 01/23/2012 0918   ALKPHOS 183* 01/23/2012 0918   BILITOT 1.4* 01/23/2012 0918   GFRNONAA 64* 01/23/2012 0918   GFRAA 74* 01/23/2012 0918     Intake/Output Summary (Last 24 hours) at 01/24/12 1011 Last data filed at 01/24/12 0815  Gross per 24 hour  Intake    640 ml  Output   1200 ml  Net   -560 ml    Weight Status:  83.7 kg (4/24)  Estimated needs:  1800 - 2000 kcal, 85 - 100 grams protein  Nutrition Dx:  Increased nutrient needs r/t recent surgery and participation in therapy activities AEB estimated needs.  Goal:  Pt to consume >/= 75% of  meals.  Intervention:  Encourage at meal times. Obtain new weight as able.  Monitor:  Weights, PO intake, labs  Asencion Partridge Pager #:  [phone]

## 2012-01-24 NOTE — Care Management Note (Signed)
Patient ID: Ryan Hess, male   DOB: Mar 29, 1924, 78 y.o.   MRN: [id number] [name] Stands at [facility] & left her a vm about pt & change of d/c plan to SNF.  [name], LCSW has spoken w/ pt's son.

## 2012-01-24 NOTE — Progress Notes (Signed)
Physical Therapy Session Note  Patient Details  Name: Ryan Hess MRN: [id number] Date of Birth: [birth year]  Today's Date: 01/24/2012 Time: [id number] Time Calculation (min): 30 min  Short Term Goals:  Week 2:  PT Short Term Goal 1 (Week 2): Pt will be able to transfer min A with RW  PT Short Term Goal 2 (Week 2): Pt will be able to gait with RW x 50' with min A PT Short Term Goal 3 (Week 2): Pt will be able to do curb step with RW to simulate home entry with mod A     Skilled Therapeutic Interventions/Progress Updates: focus on functional activities  Bed mobility in hospital bed with [facility] elevated, using rail, with mod assist to sit on EOB.  Sitting balance on EOB while taking meds orally with water, with supervison.  Sit> stand from elevated, flat bed to RW with mod assist, pulling on RW unsafely.  Pt unable to remember to stand with 1 hand pushing up from bed even after demo and hand over hand assist.  Gait training x 25',  x 10',  to/from toilet with min assist, focusing on safety with O2 tubing, etc.  Toilet transfer with mod assist, total assist for toileting.  Pt attempted to do perineal hygiene with gown in the way.    Hand washing at sink in standing; focused attention on task, with visual cues for needed items.  Pt was fatigued and requested to sit x 2 while standing x < 2 minutes.       Therapy Documentation Precautions:  Precautions Precautions: Fall Precaution Comments: O2 Restrictions Weight Bearing Restrictions: No General:   Vital Signs: O2 Device: Nasal cannula O2 Flow Rate (L/min): 2 L/min Pain: Pain Assessment Pain Assessment: No/denies pain PAINAD (Pain Assessment in Advanced Dementia) Breathing: normal Negative Vocalization: none Facial Expression: smiling or inexpressive Body Language: relaxed Consolability: no need to console PAINAD Score: 0  Mobility:   Locomotion :    Trunk/Postural Assessment :    Balance:   Exercises:     Other Treatments:    See FIM for current functional status  Therapy/Group: Individual Therapy  Cynthea Zachman 01/24/2012, 10:22 AM

## 2012-01-24 NOTE — Patient Care Conference (Signed)
Inpatient RehabilitationTeam Conference Note Date: 01/23/2012   Time: 2:30 PM    Patient Name: Ryan Hess      Medical Record Number: [id number]  Date of Birth: 04-10-1924 Sex: Male         Room/Bed: 4002/4002-02 Payor Info: Payor: [employer] [place] MEDICARE  Plan: BLUE MEDICARE  Product Type: *No Product type*     Admitting Diagnosis: Lami from hematoma evac/paraparesis  Admit Date/Time:  01/11/2012  2:14 PM Admission Comments: No comment available   Primary Diagnosis:  Epidural hematoma Principal Problem: Epidural hematoma  Patient Active Problem List  Diagnoses Date Noted  . Epidural hematoma 01/11/2012  . OTHER DYSPHAGIA 07/15/2010  . PULMONARY NODULE 03/15/2010  . PNEUMONIA, ORGANISM UNSPECIFIED 02/03/2010  . CONGESTIVE HEART FAILURE 11/16/2009  . ATRIAL FIBRILLATION 11/08/2009  . WEAKNESS 10/25/2009  . DYSPNEA 10/25/2009  . THORACIC AORTIC ANEURYSM 02/10/2009  . PREMATURE VENTRICULAR CONTRACTIONS, FREQUENT 03/17/2008  . PROSTATE CANCER 02/12/2008  . COLONIC POLYPS 02/12/2008  . PERIPHERAL VASCULAR DISEASE 02/12/2008  . COPD 02/12/2008  . DIVERTICULOSIS OF COLON 02/12/2008  . ACTINIC SKIN DAMAGE 02/12/2008  . HYPERCHOLESTEROLEMIA 08/19/2007  . HYPERTENSION 08/19/2007  . DEGENERATIVE JOINT DISEASE 08/19/2007    Expected Discharge Date: Expected Discharge Date:  (?SNF)  Team Members Present: Physician: Dr. Alger Simons Case Manager Present: Lutricia Feil, RN Social Worker Present: Lennart Pall, LCSW Nurse Present: Other (comment) Henri Medal) PT Present: Canary Brim, PT OT Present: Chrys Racer, Lorelee Cover, OT SLP Present: Weston Anna, SLP Other (Discipline and Name): Daiva Nakayama, PPS Coordinator     Current Status/Progress Goal Weekly Team Focus  Medical   mental status improving until last night. up all night. probably will continue to demonstrate a degree of cognitive dysfunction until he returns home  imoprove cognition and  attention.   limit meds. rx uti   Bowel/Bladder   Incontinent of bowel, requirs I&O cath q 8hrs  Pt to remain continent of bowel and bladder  Initiate timed toileting   Swallow/Nutrition/ Hydration             ADL's   Supervision UB ADLs, Total assist LB ADLs, REQUIRES MAX VERBAL CUES  Overall supervision  awareness, memory, ADL retraining, functional mobility with rolling walker, overall activity tolerance/endurance   Mobility   min/mod A overall; total A for cueing and safety  supervision to min A overall  increased activity tolerance; functional mobility with RW; safety/awareness; strengthening   Communication             Safety/Cognition/ Behavioral Observations            Pain   No c/o  <3  Monitor for nonverbal cues   Skin   Tegaderm to bilateral elbow skin tear. Dermabond to R groin incision, OTA. Lumbar incision with steristrips intact  No additional skin breakdown  Turn q 2hrs      *See Interdisciplinary Assessment and Plan and progress notes for long and short-term goals  Barriers to Discharge: poor cognition and baseline? cog deficits    Possible Resolutions to Barriers:  supervised environment    Discharge Planning/Teaching Needs:  ? home with son to provide 24/7 initially, however this is time limited - may need SNF for longer term recovery      Team Discussion: Pt participating w/ txs, but requiring 24/7 min-total level of care.  Continues incontinent B/B  Will need to discuss SNF as d/c plan w/ pt & insurance.   Revisions to Treatment Plan: none  Continued Need for Acute Rehabilitation Level of Care: The patient requires daily medical management by a physician with specialized training in physical medicine and rehabilitation for the following conditions: Daily direction of a multidisciplinary physical rehabilitation program to ensure safe treatment while eliciting the highest outcome that is of practical value to the patient.: Yes Daily medical management  of patient stability for increased activity during participation in an intensive rehabilitation regime.: Yes Daily analysis of laboratory values and/or radiology reports with any subsequent need for medication adjustment of medical intervention for : Post surgical problems;Neurological problems;Other  [name] 01/24/2012, 2:49 PM

## 2012-01-24 NOTE — Progress Notes (Signed)
Physical Therapy Session Note  Patient Details  Name: Ryan Hess MRN: [id number] Date of Birth: 1924-06-08  Today's Date: 01/24/2012 Time: 8657-8469 Time Calculation (min): 45 min  Short Term Goals: Week 2:  PT Short Term Goal 1 (Week 2): Pt will be able to transfer min A with RW  PT Short Term Goal 2 (Week 2): Pt will be able to gait with RW x 50' with min A PT Short Term Goal 3 (Week 2): Pt will be able to do curb step with RW to simulate home entry with mod A  Skilled Therapeutic Interventions/Progress Updates: Basic stand step transfers with RW with min A, max cueing for technique and safety, a little impulsive this AM. Pt alert and oriented to place and self, but not situation or time. Pt able to recall birthday, but unable to state age accurately. Pt participated in standing balance activity to throw and catch football with steady A, multiple sit to stands with min A. Worked on PVC pipe puzzle again today, but pt unable to [employer] on photo to real puzzle; stated he would just build his own; attended to task ~ 3 minutes in non distracting environment. Gait with RW with min A, cueing for safety and posture; tending to push RW far in front of pt. W/c propulsion on unit for general endurance with supervision and positioned his chair in the room as he preferred with supervision. Safety belt applied with reminder to pt to call for assistance in which he verbalized understanding. States he is going to "stay up in the chair to think for a little bit."     Therapy Documentation Precautions:  Precautions Precautions: Fall Precaution Comments: O2 Restrictions Weight Bearing Restrictions: No General: Amount of Missed PT Time (min): 15 Minutes Missed Time Reason: Nursing care Vital Signs:  O2 Device: Nasal cannula O2 Flow Rate (L/min): 2 L/min Pain: No complaints.  See FIM for current functional status  Therapy/Group: Individual Therapy  Karolee Stamps [facility] 01/24/2012, 11:54 AM

## 2012-01-24 NOTE — Progress Notes (Signed)
Physical Therapy Session Note  Patient Details  Name: Ryan Hess MRN: [id number] Date of Birth: 04-24-1924  Today's Date: 01/24/2012 Time: 4098-1191 Time Calculation (min): 45 min  Short Term Goals: Week 2:  PT Short Term Goal 1 (Week 2): Pt will be able to transfer min A with RW  PT Short Term Goal 2 (Week 2): Pt will be able to gait with RW x 50' with min A PT Short Term Goal 3 (Week 2): Pt will be able to do curb step with RW to simulate home entry with mod A  Skilled Therapeutic Interventions/Progress Updates:    See below for details  Therapy Documentation Precautions:  Precautions Precautions: Fall Precaution Comments: O2 Restrictions Weight Bearing Restrictions: No Pain: Pain Assessment Pain Assessment: No/denies pain Mobility:  Supine to sit with supervision.  Bed to w/c, scoot transfers mod@.  Sit to stand with min@. Needed verbal cues 75% of the time to use one hand to push up to stand.  Transfer to toilet with min@. Locomotion :   Gait training on the unit with RW, 150 x 2 with min@.  Pt needing cues to stand up straight, but tending to get head forward and at risk to lose balance forward. Balance:  Dynamic standing balance to pitch horseshoes with RW and close supervision.  At one point pt was performing without support of UEs. Exercises:  Nustep, workload = 4 x 3 min x 2. See FIM for current functional status  Therapy/Group: Individual Therapy  Georges Mouse 01/24/2012, 3:54 PM

## 2012-01-24 NOTE — Progress Notes (Signed)
Social Work  Patient ID: Ryan Hess, male   DOB: [birth year], 79 y.o.   MRN: [id number]  Spoke with patient's son this afternoon to review team conference info and discuss d/c planning. Son was in yesterday and participated with therapies and agrees with team that pt's progress has been slow and will likely require 24/7 assistance for an indefinite period of time which family is unable to provide.  Son agrees that best option at this time is to change d/c plan to SNF and we discussed area facilities.  Will discuss with pt as well.  D/c plan now SNF.  [name]

## 2012-01-25 NOTE — Progress Notes (Signed)
Patient ID: Ryan Hess, male   DOB: Jul 12, 1924, 78 y.o.   MRN: [id number] Patient ID: Ryan Hess, male   DOB: [birth year], 78 y.o.   MRN: [id number]  Subjective/Complaints: Review of Systems  All other systems reviewed and are negative.  slept better, more alert today. Denies pain  Objective: Vital Signs: Blood pressure 130/70, pulse 68, temperature 96.6 F (35.9 C), temperature source Axillary, resp. rate 16, height 5\' 9"  (1.753 m), weight 83.734 kg (184 lb 9.6 oz), SpO2 100.00%. No results found.    Alert-slow to initiate early this am HEENT: PERRL, EOMI HRT: RRR, no M,R,G CHEST: clear with w,r,r, Abd: soft, non tender, BS + Neuro: moves all exts', alert to name only, speech incoherent. Strength and sensory exam stable with equal strength all 4's Wound clean and intact with sutures    Assessment/Plan: 1. Functional deficits secondary to Paraparesis due to epidural hematoma L2,L3 compression which require 3+ hours per day of interdisciplinary therapy in a comprehensive inpatient rehab setting. Physiatrist is providing close team supervision and 24 hour management of active medical problems listed below. Physiatrist and rehab team continue to assess barriers to discharge/monitor patient progress toward functional and medical goals.    FIM: FIM - Bathing Bathing Steps Patient Completed: Chest;Right Arm;Left Arm;Abdomen;Right upper leg;Left upper leg;Front perineal area;Buttocks Bathing: 4: Min-Patient completes 8-9 66f 10 parts or 75+ percent  FIM - Upper Body Dressing/Undressing Upper body dressing/undressing steps patient completed: Thread/unthread right sleeve of pullover shirt/dresss;Thread/unthread left sleeve of pullover shirt/dress;Put head through opening of pull over shirt/dress;Pull shirt over trunk Upper body dressing/undressing: 5: Supervision: Safety issues/verbal cues FIM - Lower Body Dressing/Undressing Lower body dressing/undressing steps patient  completed: Pull pants up/down Lower body dressing/undressing: 1: Total-Patient completed less than 25% of tasks  FIM - Toileting Toileting steps completed by patient: Performs perineal hygiene Toileting: 2: Max-Patient completed 1 of 3 steps  FIM - Diplomatic Services operational officer Devices: Art gallery manager Transfers: 4-To toilet/BSC: Min A (steadying Pt. > 75%);4-From toilet/BSC: Min A (steadying Pt. > 75%)  FIM - Bed/Chair Transfer Bed/Chair Transfer Assistive Devices: Bed rails Bed/Chair Transfer: 1: Two helpers  FIM - Locomotion: Wheelchair Locomotion: Wheelchair: 2: Travels 50 - 149 ft with maximal assistance (Pt: 25 - 49%) FIM - Locomotion: Ambulation Locomotion: Ambulation Assistive Devices: Designer, industrial/product Ambulation/Gait Assistance: 3: Mod assist Locomotion: Ambulation: 1: Two helpers  Comprehension Comprehension Mode: Auditory Comprehension: 2-Understands basic 25 - 49% of the time/requires cueing 51 - 75% of the time  Expression Expression Mode: Verbal Expression: 2-Expresses basic 25 - 49% of the time/requires cueing 50 - 75% of the time. Uses single words/gestures.  Social Interaction Social Interaction Mode: Asleep Social Interaction: 2-Interacts appropriately 25 - 49% of time - Needs frequent redirection.  Problem Solving Problem Solving: 1-Solves basic less than 25% of the time - needs direction nearly all the time or does not effectively solve problems and may need a restraint for safety  Memory Memory: 1-Recognizes or recalls less than 25% of the time/requires cueing greater than 75% of the time  2. Anticoagulation/DVT prophylaxis with Pharmaceutical: Heparin  Medical Problem List and Plan:  1. lumbar epidural hematoma with paraparesis. 4 level laminectomy and evacuation of lumbar L2-3 epidural hematoma 01/05/2012  2. DVT Prophylaxis/Anticoagulation: Subcutaneous heparin. Monitor platelet counts and any signs of bleeding  3. Pain Management:   tylenol due to AMS.   4. abdominal aortic aneurysm/thoraco abdominal aneurysm. Endovascularly repaired 01/01/2012. Will need followup at [facility]  5. Chronic  atrial fibrillation. Cardiac rate control. Continue atenolol 50 mg daily. Coumadin and aspirin remain on hold for one month from date of surgery 01/05/2012 due to epidural hematoma  6. Urinary retention. Requiring intermittent caths still. May need to consider foley at discharge. uro f/u as an outpt 7. Congestive heart failure. Lasix held for low Na, cont losartan Monitoring for any signs of fluid overload  8. Hyponatremia. Lasix held. Sodium improving. Recheck tomorrow 9. COPD. Continue Spiriva and Flonase.  10. Hypertension: Continue losartan 100 mg daily. Monitor for any signs of orthostasis  11.  Somnolence/confusion: more confused last night after showing improvement for several days  -pseudomonas UTI- rxed with cipro-comptlete. Recheck urine culture  -sodium trending upward-   -only lab abnl is an elevated TSH. Even with the normal T4 this could be a sign of hypothyroid state. Don't know that i would specifically treat  -CT of head shows atrophy and SVD consistent with his age really  -all sedating meds held  -relaxed FR     LOS (Days) 14 A FACE TO FACE EVALUATION WAS PERFORMED  Eleanore Junio T 01/25/2012, 6:45 AM

## 2012-01-25 NOTE — Plan of Care (Signed)
Problem: SCI BLADDER ELIMINATION Goal: RH STG MANAGE BLADDER WITH ASSISTANCE STG Manage Bladder With moderate Assistance  Outcome: Not Progressing No urge to void. I&O cath q8hrs

## 2012-01-25 NOTE — Progress Notes (Signed)
Occupational Therapy Session Note  Patient Details  Name: Ryan Hess MRN: [id number] Date of Birth: 1923/11/12  Today's Date: 01/25/2012 Time: 0915-1007 Time Calculation (min): 52 min   Skilled Therapeutic Interventions/Progress Updates:    Pt seen today for OT individual treatment session w/ focus on ADL's for bathe/dressing tasks. Pt requires vc's for safety and sequencing of tasks as well as problem solving during ADL's. He declined shower level ADL's but was agreeable to bathe/dress at sink. Pt benefited from step by step instruction/sequencing of tasks. Pt stood at sink for oral care w/ Min A and vc's for safety. Cont toward POC and goals w/ anticipated d/c to SNF when able.  Therapy Documentation Precautions:  Precautions Precautions: Fall Precaution Comments: O2 Restrictions Weight Bearing Restrictions: No     Pain: Pain Assessment Pain Assessment: No/denies pain Pain Score: 0-No pain        See FIM for current functional status  Therapy/Group: Individual Therapy  Alm Bustard 01/25/2012, 12:41 PM

## 2012-01-25 NOTE — Progress Notes (Addendum)
Physical Therapy Session Note  Patient Details  Name: Ryan Hess MRN: [id number] Date of Birth: [birth year]  Today's Date: 01/25/2012 1st tx: 10:33-11:30 ( )  Time: 6213-0865 Time Calculation (min): 45 min   Skilled Therapeutic Interventions/Progress Updates:  1st tx: Focus of tx on safety with gait and transfers, therex for LE strengthening and coordination, and standing balance. Pt reports being very fatigued today, "I'm just an old deadbeat." Pt provided with emotional support and encouraged with progress made thus far.  Had pt read sign on front of RW for hand placement, but still needs hand-over-hand cues for safety 90% of the time with min A for lifting.  Gait with RW 2x150 with Min A for steadying and especially for stands. Pt given verbal cues for RW proximity to self, wide step width, and posture. Pt able to adjust gait quality approximately 50%, but unable to maintain.  Nustep level 3 with bil UE and LE for total, with rest breaks every 2 min due to fatigue. Pt targeting 60spm avg with good success towards this goal. Pt given cognitive tasks during ex and during rests. Pt still disoriented to place and situation at times and able to recall personal information about 50% of the time.  Sit<>stand x5 with horseshoe tossing for dynamic balance. Pt stood on edge of wedge with dysom for stability to correct for his tendency for anterior lean. Initially, pt more off balance, but adjusted to this set-up and demonstrated improved posture over BOS.  Pt demonstrated improved step width during second walk to room.   2nd tx:  Pt still very fatigued, but agreeable to PT.  Still no carryover with safety cues for hand placement.  Gait 2x150 with Min A and same cues as above. Fit pt with taller RW with double wheels, which really improved upright posture and stability with turns.  Static standing balance x67min with focus on upright posture to limit anterior lean without RW, 1 HHA only  with Min A -manual facilitation at trunk.  Wii bowling for activity tolerance in standing and balance. Sit<>stand with RW, then stood with only 1 UE on RW and Min A for steadying. Pt able to coordinate controller about 50% of the time following instruction for first 8 min, but then was unable to operate it, so switched tasks to walking again. Pt took 3 seated rest breaks with safety cues during Wii and min A.  Sit>supine with S and increased time required.  Supine bridging with cues for technique to scoot to [facility]. Therex in supine: ankle pumps bil x10, bridging x6 for LE strengthening.  Pt continued to require rest breaks throughout tx and was provided education on safety, hand placement, and orientation. Pt challenged to find room and gym, but was unable without cues for direction.     Therapy Documentation Precautions:  Precautions Precautions: Fall Precaution Comments: O2 Restrictions Weight Bearing Restrictions: No    Pain: Pain Assessment Pain Assessment: No/denies pain Pain Score: 0-No pain Mobility:   Locomotion : Ambulation Ambulation/Gait Assistance: 4: Min assist  See FIM for current functional status  Therapy/Group: Individual Therapy  Virl Cagey, PT 01/25/2012, 1:57 PM

## 2012-01-25 NOTE — Progress Notes (Signed)
Occupational Therapy Session Note  Patient Details  Name: Ryan Hess MRN: [id number] Date of Birth: 10/08/1923  Today's Date: 01/25/2012 Time: 1130-1200 Time Calculation (min): 30 min   Skilled Therapeutic Interventions/Progress Updates:    Focus of session on orientation and emergent awareness, problem solving and sustained attention. Pt completed activity of using sign to find dayroom, complete simple task of watering 1 plant and then returning to room. Able to use external cue to locate dayroom. od vc to recall task, then found room independently. Placed signed in room to assist with orientation of place and situation.   Therapy Documentation Precautions:  Precautions Precautions: Fall Precaution Comments: O2 Restrictions Weight Bearing Restrictions: No   Pain: Pain Assessment Pain Assessment: No/denies pain ADL:   See FIM for current functional status  Therapy/Group: Individual Therapy  Tameshia Bonneville,HILLARY 01/25/2012, 3:17 PM [facility], OTR/L  [phone] 01/25/2012

## 2012-01-26 LAB — URINE CULTURE
Colony Count: NO GROWTH
Culture  Setup Time: 201305021500
Culture: NO GROWTH

## 2012-01-26 NOTE — Care Management Note (Signed)
Patient ID: Ryan Hess, male   DOB: 05-31-1924, 78 y.o.   MRN: [id number] Faxed update to [facility] at [facility].

## 2012-01-26 NOTE — Progress Notes (Signed)
Occupational Therapy Session Note  Patient Details  Name: Ryan Hess MRN: [id number] Date of Birth: September 18, 1924  Today's Date: 01/26/2012 Time: 1450-1520 Time Calculation (min): 30 min   Skilled Therapeutic Interventions/Progress Updates:    Worked on functional transfers and mobility using the RW.  Practiced toilet transfers using the RW with min assist and mod instructional cues to stay closer to the walker.  Pt tends to push the walker too far away making him lean forward. Also stood at the sink to perform one grooming task with close supervision (comb his hair).  Ambulated to the [facility] gym and after sitting to rest asked orientation questions.  Pt able to state month and that he was at the hospital but not aware of the name of the hospital or day of the week.  Instructed him to find his room number on the way back.  Pt needed mod questioning cues to complete this, however was able to remember the room number on 2 occasions that the therapist asked him.  Returned to bed at conclusion of session and bed alarm set.   Therapy Documentation Precautions:  Precautions Precautions: Fall Precaution Comments: 98% on RA this AM Restrictions Weight Bearing Restrictions: No  Pain: Pain Assessment Pain Assessment: No/denies pain ADL: See FIM for current functional status  Therapy/Group: Individual Therapy  Kendra Woolford 01/26/2012, 3:55 PM

## 2012-01-26 NOTE — Progress Notes (Signed)
Physical Therapy Weekly Progress Note  Patient Details  Name: KYAIR DITOMMASO MRN: [id number] Date of Birth: [birth year]  Today's Date: 01/26/2012 1st tx Time: 1020-1115 Time Calculation (min): 55 min 2nd tx  13:00-13:43 ( )   Patient has met 3 of 4 short term goals, performing all mobility with Min A fairly consistently, but does demonstrate variance day to day.   Patient continues to demonstrate the following deficits: generalized weakness, difficulty with gait, and cognitive impairments, and therefore will continue to benefit from skilled PT intervention to enhance overall performance with activity tolerance, balance and awareness.  Patient progressing toward long term goals..  Continue plan of care.  PT Short Term Goals Week 2:  PT Short Term Goal 1 (Week 2): Pt will be able to transfer min A with RW  PT Short Term Goal 1 - Progress (Week 2): Met PT Short Term Goal 2 (Week 2): Pt will be able to gait with RW x 50' with min A PT Short Term Goal 2 - Progress (Week 2): Met PT Short Term Goal 3 (Week 2): Pt will be able to do curb step with RW to simulate home entry with mod A PT Short Term Goal 3 - Progress (Week 2): Not met Week 3:  PT Short Term Goal 1 (Week 3): Pt will ambulate in home environment wih Min A x25'  PT Short Term Goal 1 - Progress (Week 3): Progressing toward goal PT Short Term Goal 2 (Week 3): Pt will go up/down >2 stairs with Min A PT Short Term Goal 2 - Progress (Week 3): Progressing toward goal PT Short Term Goal 3 (Week 3): Pt will perform curb step with Min A PT Short Term Goal 3 - Progress (Week 3): Progressing toward goal PT Short Term Goal 4 (Week 3): Pt will perform car transfer with Min A  Skilled Therapeutic Interventions/Progress Updates:  1st treatment:  Nursing working to wean pt from 02 this tx, but desats to 70's following gait so placed back on 2L during therapy.  Pt up in [facility], cheerful, able to recall therapist from yesterday.  Pt ambulated  1x75' and 1x100' with RW and Min A for steadying, especially with turns.  Sit<>stand from RW, pt reminded to use sign on walker for cues, and was able to follow the directions this tx 75% of the time.  Sit<>stand x5 from furniture with Min A for lifting. Played country music for pt, which seemed to lift spirits a bit.  Played horseshoes without UE assist for dynamic balance, with min-mod A for steadying to challenge dynamic standing balance and activity tolerance.  Seated therex for LE strengthening: ankle pumps, marhcing, and LAQ with increased difficulty and pain on L hip/knee.  Dynamic sitting balance with S for functional tasks donning/doffing shoes with set-up assist for task.  WC<>bed without device, Min A.  Sit>supine with S for safety.  Bridging in bed x 5 with cues for technique.   2nd tx:  Pt very fatigued in bed, not wanting to get up for therapy, but agreeable to bedside tx for strengthening.  Continued to try weaning pt off 02 during supine ex, with good success this tx, 02 ranging in mid-90s Performed the following ex 2x15 unless otherwise noted bilaterally with cues for technique:  Supine: Ankle pumps, quad sets, glute sets, heel slides, hip ABD/ADD, SLR, bridging Sidelyling: hip ABD with AAROM  Pt with difficulty staying awake between ex, but easily redirected with verbal cues.       See  FIM for current functional status  Therapy/Group: Individual Therapy  Astria Jordahl M 01/26/2012, 1:20 PM

## 2012-01-26 NOTE — Progress Notes (Signed)
Subjective/Complaints: Review of Systems  All other systems reviewed and are negative.  no new complaints. Reasonable night  Objective: Vital Signs: Blood pressure 130/70, pulse 68, temperature 96.6 F (35.9 C), temperature source Axillary, resp. rate 16, height 5\' 9"  (1.753 m), weight 83.734 kg (184 lb 9.6 oz), SpO2 100.00%. No results found.    Alert-slow to initiate early this am HEENT: PERRL, EOMI HRT: RRR, no M,R,G CHEST: clear with w,r,r, Abd: soft, non tender, BS + Neuro: moves all exts', alert to name and town with cues this am. Fairly alert. Strength and sensory exam stable with equal strength all 4's Wound clean and intact with sutures    Assessment/Plan: 1. Functional deficits secondary to Paraparesis due to epidural hematoma L2,L3 compression which require 3+ hours per day of interdisciplinary therapy in a comprehensive inpatient rehab setting. Physiatrist is providing close team supervision and 24 hour management of active medical problems listed below. Physiatrist and rehab team continue to assess barriers to discharge/monitor patient progress toward functional and medical goals.    FIM: FIM - Bathing Bathing Steps Patient Completed: Chest;Right Arm;Left Arm;Abdomen;Right upper leg;Left upper leg;Front perineal area;Buttocks Bathing: 4: Min-Patient completes 8-9 68f 10 parts or 75+ percent  FIM - Upper Body Dressing/Undressing Upper body dressing/undressing steps patient completed: Thread/unthread right sleeve of pullover shirt/dresss;Thread/unthread left sleeve of pullover shirt/dress;Put head through opening of pull over shirt/dress;Pull shirt over trunk Upper body dressing/undressing: 5: Supervision: Safety issues/verbal cues FIM - Lower Body Dressing/Undressing Lower body dressing/undressing steps patient completed: Pull pants up/down Lower body dressing/undressing: 1: Total-Patient completed less than 25% of tasks  FIM - Toileting Toileting steps  completed by patient: Performs perineal hygiene Toileting: 2: Max-Patient completed 1 of 3 steps  FIM - Diplomatic Services operational officer Devices: Art gallery manager Transfers: 4-To toilet/BSC: Min A (steadying Pt. > 75%);4-From toilet/BSC: Min A (steadying Pt. > 75%)  FIM - Bed/Chair Transfer Bed/Chair Transfer Assistive Devices: Bed rails Bed/Chair Transfer: 1: Two helpers  FIM - Locomotion: Wheelchair Locomotion: Wheelchair: 2: Travels 50 - 149 ft with maximal assistance (Pt: 25 - 49%) FIM - Locomotion: Ambulation Locomotion: Ambulation Assistive Devices: Designer, industrial/product Ambulation/Gait Assistance: 3: Mod assist Locomotion: Ambulation: 1: Two helpers  Comprehension Comprehension Mode: Auditory Comprehension: 2-Understands basic 25 - 49% of the time/requires cueing 51 - 75% of the time  Expression Expression Mode: Verbal Expression: 2-Expresses basic 25 - 49% of the time/requires cueing 50 - 75% of the time. Uses single words/gestures.  Social Interaction Social Interaction Mode: Asleep Social Interaction: 2-Interacts appropriately 25 - 49% of time - Needs frequent redirection.  Problem Solving Problem Solving: 1-Solves basic less than 25% of the time - needs direction nearly all the time or does not effectively solve problems and may need a restraint for safety  Memory Memory: 1-Recognizes or recalls less than 25% of the time/requires cueing greater than 75% of the time  2. Anticoagulation/DVT prophylaxis with Pharmaceutical: Heparin  Medical Problem List and Plan:  1. lumbar epidural hematoma with paraparesis. 4 level laminectomy and evacuation of lumbar L2-3 epidural hematoma 01/05/2012  2. DVT Prophylaxis/Anticoagulation: Subcutaneous heparin. Monitor platelet counts and any signs of bleeding  3. Pain Management:  tylenol due to AMS.   4. abdominal aortic aneurysm/thoraco abdominal aneurysm. Endovascularly repaired 01/01/2012. Will need followup at [facility]    5. Chronic atrial fibrillation. Cardiac rate control. Continue atenolol 50 mg daily. Coumadin and aspirin remain on hold for one month from date of surgery 01/05/2012 due to epidural hematoma  6. Urinary retention. Requiring intermittent caths still. Probably will need foley at time of placement 7. Congestive heart failure. Lasix held for low Na, cont losartan Monitoring for any signs of fluid overload  8. Hyponatremia. Lasix held. Sodium at 131 9. COPD. Continue Spiriva and Flonase.  10. Hypertension: Continue losartan 100 mg daily. Monitor for any signs of orthostasis  11.  Somnolence/confusion: overall patient is confused but not back to baseline  -pseudomonas UTI treated with re culture pending  -sodium trending upward and other labs ok  -only lab abnl is an elevated TSH. Even with the normal T4 this could be a sign of hypothyroid state. Don't know that i would specifically treat  -CT of head shows atrophy and SVD consistent with his age really  -all sedating meds held  -relaxed FR     LOS (Days) 15 A FACE TO FACE EVALUATION WAS PERFORMED  Vicy Medico T 01/26/2012, 6:49 AM

## 2012-01-26 NOTE — Progress Notes (Signed)
Per report pt had large continent bowel movement on toilet this morning. Pt was still unable to void.  Pt has been having high I&O cath volumes, changed order to Q6hrs I&O cath if no void per doctor order.  Pt has been weaning off O2. At 1000 with bathing and dressing pt O2 stat was 97% on RA.  While in physical therapy attempted to exercise while on RA with stats being in low 80s.  Pt was put on 2L O2 with stats rising to high 90s.   At 1600 pt BP manually was 92/50. Notified Marissa Nestle, PA Cont. To monitor patient.

## 2012-01-26 NOTE — Progress Notes (Signed)
Occupational Therapy Session Note  Patient Details  Name: Ryan Hess MRN: [id number] Date of Birth: May 03, 1924  Today's Date: 01/26/2012 Time:  9:01- 10:03    Short Term Goals:   Skilled Therapeutic Interventions/Progress Updates: Focus today was on therapeutic B/D/grooming. Pt did quite well with B/D tasks post set-up and Supervision; however once this was completed I asked him what else he needed to do and his response was" I think I will go and visit my mom". Asked him if he needed to brush his teeth and shave and he said yes. Brushed teeth post set-up then respiratory came in to give him his inhaler and then he had to be reminded that he needed to shave. Decreased safety with when he chose to lock and unlock the wheelchair.     Therapy Documentation Precautions:  Precautions Precautions: Fall Precaution Comments: 98% on RA this AM Restrictions Weight Bearing Restrictions: No General:   Vital Signs: Oxygen Therapy SpO2: 99 % O2 Device: None (Room air) Pain: Pain Assessment Pain Assessment: No/denies pain Pain Score: 0-No pain ADL: ADL Equipment Provided: Long-handled shoe horn Grooming: Setup;Supervision/safety;Moderate cueing (To initiate grooming tasks of teeth and shaving) Where Assessed-Grooming: Sitting at sink Upper Body Bathing: Supervision/safety;Setup Where Assessed-Upper Body Bathing: Sitting at sink Lower Body Bathing: Minimal assistance (for feet) Where Assessed-Lower Body Bathing:  (sit to stand at sink) Upper Body Dressing: Setup;Supervision/safety Where Assessed-Upper Body Dressing: Sitting at sink Lower Body Dressing: Moderate assistance;Moderate cueing (mod cues for trying different techniques to increase success) Where Assessed-Lower Body Dressing:  (sit to stand at sink) ADL Comments: Pt given option of sponge bath at sink or shower--he preferred to sponge bath. I asked him what he normally does at home and he said shower. When asked why he has  not wanted to shower her for the past 2 days he responded, "I have too many homes in [place] and they are not the same". When I asked him where  he is now he said, "I do nto rightly know". I told him he was a [facility] in [place] and he responded"Oh, I knew it was [facility]" Exercises:   Other Treatments:    See FIM for current functional status  Therapy/Group: Individual Therapy  [name] [id number] 01/26/2012, 11:22 AM

## 2012-01-27 DIAGNOSIS — Z5189 Encounter for other specified aftercare: Secondary | ICD-10-CM

## 2012-01-27 DIAGNOSIS — G8220 Paraplegia, unspecified: Secondary | ICD-10-CM

## 2012-01-27 DIAGNOSIS — R4182 Altered mental status, unspecified: Secondary | ICD-10-CM

## 2012-01-27 DIAGNOSIS — N390 Urinary tract infection, site not specified: Secondary | ICD-10-CM

## 2012-01-27 DIAGNOSIS — R339 Retention of urine, unspecified: Secondary | ICD-10-CM

## 2012-01-27 DIAGNOSIS — M48061 Spinal stenosis, lumbar region without neurogenic claudication: Secondary | ICD-10-CM

## 2012-01-27 NOTE — Progress Notes (Signed)
Physical Therapy Note  Patient Details  Name: Ryan Hess MRN: [id number] Date of Birth: Mar 23, 1924 Today's Date: 01/27/2012  1400-1455 (55 minutes) individual Pain: no complaint of pain  Oxygen Sats= 98 % 2L [place] Focus of treatment: Treatment: Transfer SPT using bilateral UE support min to close supervision; gait 120 feet RW min to close supervision with assist for direction finding; sit to stand with cues to use armrests; standing (pitching horseshoes) min assist with loss of balance X 2; ball toss in standing to rebounder- uses stepping strategy to attempt to maintain dynamic balance -requires assist to prevent fall;  Alternate stepping to 2 inch step min to facilitate balance reactions .  Rhayne Chatwin,JIM 01/27/2012, 2:17 PM

## 2012-01-27 NOTE — Progress Notes (Signed)
Patient ID: Ryan Hess, male   DOB: May 21, 1924, 78 y.o.   MRN: [id number]  Subjective/Complaints: Review of Systems  All other systems reviewed and are negative.  no new complaints. Reasonable night.  Confused oriented to self  Objective: Vital Signs: Blood pressure 130/70, pulse 68, temperature 96.6 F (35.9 C), temperature source Axillary, resp. rate 16, height '5\' 9"'$  (1.753 m), weight 83.734 kg (184 lb 9.6 oz), SpO2 100.00%. No results found. Subjective/Complaints:   Objective: Vital Signs: Blood pressure 114/75, pulse 67, temperature 97.6 F (36.4 C), temperature source Oral, resp. rate 18, height '5\' 9"'$  (1.753 m), weight 83.3 kg (183 lb 10.3 oz), SpO2 94.00%. No results found. Results for orders placed during the hospital encounter of 01/11/12 (from the past 72 hour(s))  URINE CULTURE     Status: Normal   Collection Time   01/25/12  1:58 PM      Component Value Range Comment   Specimen Description URINE, CATHETERIZED      Special Requests NONE      Culture  Setup Time [id number]      Colony Count NO GROWTH      Culture NO GROWTH      Report Status 01/26/2012 FINAL     . FIM: FIM - Bathing Bathing Steps Patient Completed: Chest;Right Arm;Left Arm;Abdomen;Front perineal area;Buttocks;Right upper leg;Left upper leg Bathing: 4: Min-Patient completes 8-9 65f10 parts or 75+ percent  FIM - Upper Body Dressing/Undressing Upper body dressing/undressing steps patient completed: Thread/unthread right sleeve of pullover shirt/dresss;Thread/unthread left sleeve of pullover shirt/dress;Put head through opening of pull over shirt/dress;Pull shirt over trunk Upper body dressing/undressing: 5: Supervision: Safety issues/verbal cues FIM - Lower Body Dressing/Undressing Lower body dressing/undressing steps patient completed: Thread/unthread right underwear leg;Thread/unthread left underwear leg;Pull underwear up/down;Thread/unthread right pants leg;Thread/unthread left pants leg;Pull  pants up/down;Don/Doff right shoe;Don/Doff left shoe Lower body dressing/undressing: 3: Mod-Patient completed 50-74% of tasks  FIM - Toileting Toileting steps completed by patient: Performs perineal hygiene Toileting Assistive Devices: Grab bar or rail for support Toileting: 1: Total-Patient completed zero steps, helper did all 3  FIM - TRadio producerDevices: WInsurance account managerTransfers: 4-To toilet/BSC: Min A (steadying Pt. > 75%)  FIM - BControl and instrumentation engineerDevices: Walker;Arm rests Bed/Chair Transfer: 5: Sit > Supine: Supervision (verbal cues/safety issues);4: Bed > Chair or W/C: Min A (steadying Pt. > 75%);4: Chair or W/C > Bed: Min A (steadying Pt. > 75%)  FIM - Locomotion: Wheelchair Distance: 75 Locomotion: Wheelchair: 1: Total Assistance/staff pushes wheelchair (Pt<25%) FIM - Locomotion: Ambulation Locomotion: Ambulation Assistive Devices: WAdministratorAmbulation/Gait Assistance: 4: Min assist Locomotion: Ambulation: 2: Travels 50 - 149 ft with minimal assistance (Pt.>75%)  Comprehension Comprehension Mode: Auditory Comprehension: 3-Understands basic 50 - 74% of the time/requires cueing 25 - 50%  of the time  Expression Expression Mode: Verbal Expression: 3-Expresses basic 50 - 74% of the time/requires cueing 25 - 50% of the time. Needs to repeat parts of sentences.  Social Interaction Social Interaction Mode: Asleep Social Interaction: 2-Interacts appropriately 25 - 49% of time - Needs frequent redirection.  Problem Solving Problem Solving: 2-Solves basic 25 - 49% of the time - needs direction more than half the time to initiate, plan or complete simple activities  Memory Memory: 2-Recognizes or recalls 25 - 49% of the time/requires cueing 51 - 75% of the time   01/27/2012,       Alert-slow to initiate early this am HEENT: PERRL, EOMI HRT: RRR, no M,R,G  CHEST: clear with w,r,r, Abd: soft, non tender, BS  + Neuro: moves all exts', alert to name and town with cues this am. Fairly alert. Strength and sensory exam stable with equal strength all 4's Wound clean and intact with sutures    Assessment/Plan: 1. Functional deficits secondary to Paraparesis due to epidural hematoma L2,L3 compression which require 3+ hours per day of interdisciplinary therapy in a comprehensive inpatient rehab setting. Physiatrist is providing close team supervision and 24 hour management of active medical problems listed below. Physiatrist and rehab team continue to assess barriers to discharge/monitor patient progress toward functional and medical goals.     time  2. Anticoagulation/DVT prophylaxis with Pharmaceutical: Heparin  Medical Problem List and Plan:  1. lumbar epidural hematoma with paraparesis. 4 level laminectomy and evacuation of lumbar L2-3 epidural hematoma 01/05/2012  2. DVT Prophylaxis/Anticoagulation: Subcutaneous heparin. Monitor platelet counts and any signs of bleeding  3. Pain Management:  tylenol due to AMS.   4. abdominal aortic aneurysm/thoraco abdominal aneurysm. Endovascularly repaired 01/01/2012. Will need followup at [facility]  5. Chronic atrial fibrillation. Cardiac rate control. Continue atenolol 50 mg daily. Coumadin and aspirin remain on hold for one month from date of surgery 01/05/2012 due to epidural hematoma  6. Urinary retention. Requiring intermittent caths still. Probably will need foley at time of placement 7. Congestive heart failure. Lasix held for low Na, cont losartan Monitoring for any signs of fluid overload  8. Hyponatremia. Lasix held. Sodium at 131 9. COPD. Continue Spiriva and Flonase.  10. Hypertension: Continue losartan 100 mg daily. Monitor for any signs of orthostasis  11.  Somnolence/confusion: overall patient is confused but not back to baseline  -pseudomonas UTI treated with re culture pending  -sodium trending upward and other labs ok  -only lab abnl  is an elevated TSH. Even with the normal T4 this could be a sign of hypothyroid state. Don't know that i would specifically treat  -CT of head shows atrophy and SVD consistent with his age really  -all sedating meds held  -relaxed FR     LOS (Days) 16 A FACE TO FACE EVALUATION WAS PERFORMED  Rease Swinson E 01/27/2012, 6:54 AM

## 2012-01-28 MED ORDER — TAMSULOSIN HCL 0.4 MG PO CAPS
0.8000 mg | ORAL_CAPSULE | Freq: Every day | ORAL | Status: DC
  Administered 2012-01-28 – 2012-01-29 (×2): 0.8 mg via ORAL
  Filled 2012-01-28 (×4): qty 2

## 2012-01-28 NOTE — Progress Notes (Signed)
Occupational Therapy Session Note  Patient Details  Name: Ryan Hess MRN: [id number] Date of Birth: [birth year]  Today's Date: 01/28/2012 Time: 4098-1191 Time Calculation (min): 45 min  Skilled Therapeutic Interventions/Progress Updates: ADL in w/c at sink with overall close S.  Patient oriented to place and situation today.  Also used RW to toilet transfer with close S     Therapy Documentation  Pain:no c/os See FIM for current functional status  Therapy/Group: Individual Therapy  Bud Face [facility] 01/28/2012, 4:28 PM

## 2012-01-28 NOTE — Progress Notes (Signed)
Patient ID: Ryan Hess, male   DOB: 06-30-1924, 78 y.o.   MRN: [id number]  Subjective/Complaints: Review of Systems  All other systems reviewed and are negative.  no new complaints. Reasonable night.  Confused oriented to self  Objective: Vital Signs: Blood pressure 130/70, pulse 68, temperature 96.6 F (35.9 C), temperature source Axillary, resp. rate 16, height 5\' 9"  (1.753 m), weight 83.734 kg (184 lb 9.6 oz), SpO2 100.00%. No results found. Subjective/Complaints:   Objective: Vital Signs: Blood pressure 104/57, pulse 79, temperature 98.7 F (37.1 C), temperature source Oral, resp. rate 16, height 5\' 9"  (1.753 m), weight 83.3 kg (183 lb 10.3 oz), SpO2 99.00%. No results found. Results for orders placed during the hospital encounter of 01/11/12 (from the past 72 hour(s))  URINE CULTURE     Status: Normal   Collection Time   01/25/12  1:58 PM      Component Value Range Comment   Specimen Description URINE, CATHETERIZED      Special Requests NONE      Culture  Setup Time [id number]      Colony Count NO GROWTH      Culture NO GROWTH      Report Status 01/26/2012 FINAL     . FIM: FIM - Bathing Bathing Steps Patient Completed: Chest;Right Arm;Left Arm;Abdomen;Front perineal area;Buttocks;Right upper leg;Left upper leg Bathing: 4: Min-Patient completes 8-9 54f 10 parts or 75+ percent  FIM - Upper Body Dressing/Undressing Upper body dressing/undressing steps patient completed: Thread/unthread right sleeve of pullover shirt/dresss;Pull shirt over trunk;Put head through opening of pull over shirt/dress;Thread/unthread left sleeve of pullover shirt/dress Upper body dressing/undressing: 5: Supervision: Safety issues/verbal cues FIM - Lower Body Dressing/Undressing Lower body dressing/undressing steps patient completed: Thread/unthread right underwear leg;Thread/unthread left underwear leg;Pull underwear up/down;Thread/unthread right pants leg;Thread/unthread left pants leg;Pull  pants up/down Lower body dressing/undressing: 3: Mod-Patient completed 50-74% of tasks  FIM - Toileting Toileting steps completed by patient: Performs perineal hygiene Toileting Assistive Devices: Grab bar or rail for support Toileting: 1: Total-Patient completed zero steps, helper did all 3  FIM - Diplomatic Services operational officer Devices: Art gallery manager Transfers: 4-To toilet/BSC: Min A (steadying Pt. > 75%)  FIM - Banker Devices: Walker;Arm rests Bed/Chair Transfer: 4: Chair or W/C > Bed: Min A (steadying Pt. > 75%);5: Supine > Sit: Supervision (verbal cues/safety issues);5: Sit > Supine: Supervision (verbal cues/safety issues);4: Bed > Chair or W/C: Min A (steadying Pt. > 75%)  FIM - Locomotion: Wheelchair Distance: 75 Locomotion: Wheelchair: 1: Total Assistance/staff pushes wheelchair (Pt<25%) FIM - Locomotion: Ambulation Locomotion: Ambulation Assistive Devices: Designer, industrial/product Ambulation/Gait Assistance: 4: Min assist Locomotion: Ambulation: 2: Travels 50 - 149 ft with minimal assistance (Pt.>75%)  Comprehension Comprehension Mode: Auditory Comprehension: 3-Understands basic 50 - 74% of the time/requires cueing 25 - 50%  of the time  Expression Expression Mode: Verbal Expression: 3-Expresses basic 50 - 74% of the time/requires cueing 25 - 50% of the time. Needs to repeat parts of sentences.  Social Interaction Social Interaction Mode: Asleep Social Interaction: 2-Interacts appropriately 25 - 49% of time - Needs frequent redirection.  Problem Solving Problem Solving: 2-Solves basic 25 - 49% of the time - needs direction more than half the time to initiate, plan or complete simple activities  Memory Memory: 2-Recognizes or recalls 25 - 49% of the time/requires cueing 51 - 75% of the time   01/28/2012,       Alert-slow to initiate early this am HEENT: PERRL, EOMI HRT:  RRR, no M,R,G CHEST: clear with w,r,r, Abd:  soft, non tender, BS + Neuro: moves all exts', alert to name and town with cues this am. Fairly alert. Strength and sensory exam stable with equal strength all 4's Wound clean and intact with sutures    Assessment/Plan: 1. Functional deficits secondary to Paraparesis due to epidural hematoma L2,L3 compression which require 3+ hours per day of interdisciplinary therapy in a comprehensive inpatient rehab setting. Physiatrist is providing close team supervision and 24 hour management of active medical problems listed below. Physiatrist and rehab team continue to assess barriers to discharge/monitor patient progress toward functional and medical goals.     time  2. Anticoagulation/DVT prophylaxis with Pharmaceutical: Heparin  Medical Problem List and Plan:  1. lumbar epidural hematoma with paraparesis. 4 level laminectomy and evacuation of lumbar L2-3 epidural hematoma 01/05/2012  2. DVT Prophylaxis/Anticoagulation: Subcutaneous heparin. Monitor platelet counts and any signs of bleeding  3. Pain Management:  tylenol due to AMS.   4. abdominal aortic aneurysm/thoraco abdominal aneurysm. Endovascularly repaired 01/01/2012. Will need follow[facility] at [facility]  5. Chronic atrial fibrillation. Cardiac rate control. Continue atenolol 50 mg daily. Coumadin and aspirin remain on hold for one month from date of surgery 01/05/2012 due to epidural hematoma  6. Urinary retention. Requiring intermittent caths still.Increase flomax to .8mg  Probably will need foley at time of placement 7. Congestive heart failure. Lasix held for low Na, cont losartan Monitoring for any signs of fluid overload  8. Hyponatremia. Lasix held. Sodium at 131 9. COPD. Continue Spiriva and Flonase.  10. Hypertension: Continue losartan 100 mg daily. Monitor for any signs of orthostasis  11.  Somnolence/confusion: overall patient is confused but not back to baseline  -pseudomonas UTI treated with re culture pending  -sodium trending  upward and other labs ok  -only lab abnl is an elevated TSH. Even with the normal T4 this could be a sign of hypothyroid state. Don't know that i would specifically treat  -CT of head shows atrophy and SVD consistent with his age really  -all sedating meds held  -relaxed FR     LOS (Days) 17 A FACE TO FACE EVALUATION WAS PERFORMED  Thelma Lorenzetti E 01/28/2012, 8:06 AM

## 2012-01-29 ENCOUNTER — Ambulatory Visit: Payer: Medicare Other | Admitting: Cardiovascular Disease

## 2012-01-29 DIAGNOSIS — Z5189 Encounter for other specified aftercare: Secondary | ICD-10-CM

## 2012-01-29 DIAGNOSIS — R4182 Altered mental status, unspecified: Secondary | ICD-10-CM

## 2012-01-29 DIAGNOSIS — M48061 Spinal stenosis, lumbar region without neurogenic claudication: Secondary | ICD-10-CM

## 2012-01-29 DIAGNOSIS — G8220 Paraplegia, unspecified: Secondary | ICD-10-CM

## 2012-01-29 DIAGNOSIS — N390 Urinary tract infection, site not specified: Secondary | ICD-10-CM

## 2012-01-29 DIAGNOSIS — R339 Retention of urine, unspecified: Secondary | ICD-10-CM

## 2012-01-29 NOTE — Plan of Care (Signed)
Problem: SCI BLADDER ELIMINATION Goal: RH STG MANAGE BLADDER WITH ASSISTANCE STG Manage Bladder With moderate Assistance  Outcome: Not Met (add Reason) Patient requires I/O cath q8hours for no void

## 2012-01-29 NOTE — Plan of Care (Signed)
Problem: SCI BOWEL ELIMINATION Goal: RH STG MANAGE BOWEL WITH ASSISTANCE STG Manage Bowel with moderate Assistance.  Outcome: Not Progressing Incontinent tonight  Problem: SCI BLADDER ELIMINATION Goal: RH STG MANAGE BLADDER WITH ASSISTANCE STG Manage Bladder With moderate Assistance  Outcome: Not Progressing Continues to require I/O catherization q 6 hours  Problem: RH SKIN INTEGRITY Goal: RH STG SKIN FREE OF INFECTION/BREAKDOWN Stay free of any skin breakdown Outcome: Not Progressing Skin tears to bil arms  Problem: RH SAFETY Goal: RH STG ADHERE TO SAFETY PRECAUTIONS W/ASSISTANCE/DEVICE STG Adhere to Safety Precautions With minimal Assistance/Device.  Outcome: Not Progressing Continues to get up without assist, requires bed alarm, waist belt in chair and not left alone in room

## 2012-01-29 NOTE — Progress Notes (Signed)
Patient ID: Ryan Hess, male   DOB: Jan 04, 1924, 78 y.o.   MRN: [id number] Patient ID: Ryan Hess, male   DOB: [birth year], 78 y.o.   MRN: [id number]  Subjective/Complaints: Review of Systems  All other systems reviewed and are negative.  getting oob this am to go to the bathroom.  Objective: Vital Signs: Blood pressure 130/70, pulse 68, temperature 96.6 F (35.9 C), temperature source Axillary, resp. rate 16, height '5\' 9"'$  (1.753 m), weight 83.734 kg (184 lb 9.6 oz), SpO2 100.00%. No results found. Subjective/Complaints:   Objective: Vital Signs: Blood pressure 96/59, pulse 59, temperature 97.9 F (36.6 C), temperature source Oral, resp. rate 19, height '5\' 9"'$  (1.753 m), weight 83.3 kg (183 lb 10.3 oz), SpO2 96.00%. No results found. No results found for this or any previous visit (from the past 72 hour(s)).[name] FIM: FIM - Bathing Bathing Steps Patient Completed: Chest;Right Arm;Left Arm;Abdomen;Right upper leg;Left upper leg;Front perineal area (in w/c at sink) Bathing: 4: Min-Patient completes 8-9 40f10 parts or 75+ percent  FIM - Upper Body Dressing/Undressing Upper body dressing/undressing steps patient completed: Thread/unthread right sleeve of pullover shirt/dresss;Pull shirt over trunk;Put head through opening of pull over shirt/dress;Thread/unthread left sleeve of pullover shirt/dress Upper body dressing/undressing: 5: Set-up assist to: Obtain clothing/put away FIM - Lower Body Dressing/Undressing Lower body dressing/undressing steps patient completed: Pull pants up/down (kept on his underwear and pants and stated he'd washed prior) Lower body dressing/undressing: 4: Min-Patient completed 75 plus % of tasks  FIM - Toileting Toileting steps completed by patient: Adjust clothing prior to toileting;Adjust clothing after toileting;Performs perineal hygiene Toileting Assistive Devices: Grab bar or rail for support Toileting: 4: Steadying assist  FIM - TGlass blower/designerDevices: WInsurance account managerTransfers: 5-To toilet/BSC: Supervision (verbal cues/safety issues);5-From toilet/BSC: Supervision (verbal cues/safety issues)  FIM - BControl and instrumentation engineerDevices: Walker;Arm rests Bed/Chair Transfer: 5: Supine > Sit: Supervision (verbal cues/safety issues);5: Sit > Supine: Supervision (verbal cues/safety issues)  FIM - Locomotion: Wheelchair Distance: 75 Locomotion: Wheelchair: 1: Total Assistance/staff pushes wheelchair (Pt<25%) FIM - Locomotion: Ambulation Locomotion: Ambulation Assistive Devices: WAdministratorAmbulation/Gait Assistance: 4: Min assist Locomotion: Ambulation: 2: Travels 50 - 149 ft with minimal assistance (Pt.>75%)  Comprehension Comprehension Mode: Auditory Comprehension: 5-Understands complex 90% of the time/Cues < 10% of the time  Expression Expression Mode: Verbal Expression: 6-Expresses complex ideas: With extra time/assistive device  Social Interaction Social Interaction Mode: Asleep Social Interaction: 6-Interacts appropriately with others with medication or extra time (anti-anxiety, antidepressant).  Problem Solving Problem Solving: 4-Solves basic 75 - 89% of the time/requires cueing 10 - 24% of the time  Memory Memory: 4-Recognizes or recalls 75 - 89% of the time/requires cueing 10 - 24% of the time   01/29/2012,       Alert-slow to initiate early this am HEENT: PERRL, EOMI HRT: RRR, no M,R,G CHEST: clear with w,r,r, Abd: soft, non tender, BS + Neuro: moves all exts', alert to name and town with cues this am. Fairly alert. Strength and sensory exam stable with equal strength all 4's Wound clean and intact with sutures Impulsive with gait. Lets walker get out in front of him a bit.    Assessment/Plan: 1. Functional deficits secondary to Paraparesis due to epidural hematoma L2,L3 compression which require 3+ hours per day of interdisciplinary therapy in  a comprehensive inpatient rehab setting. Physiatrist is providing close team supervision and 24 hour management of active medical problems listed below. Physiatrist and rehab team  continue to assess barriers to discharge/monitor patient progress toward functional and medical goals.    2. Anticoagulation/DVT prophylaxis with Pharmaceutical: Heparin  Medical Problem List and Plan:  1. lumbar epidural hematoma with paraparesis. 4 level laminectomy and evacuation of lumbar L2-3 epidural hematoma 01/05/2012  2. DVT Prophylaxis/Anticoagulation: Subcutaneous heparin.  3. Pain Management:  tylenol due to AMS.   4. abdominal aortic aneurysm/thoraco abdominal aneurysm. Endovascularly repaired 01/01/2012. Will need followup at [facility]  5. Chronic atrial fibrillation. Cardiac rate control. Continue atenolol 50 mg daily. Coumadin and aspirin remain on hold for one month from date of surgery 01/05/2012 due to epidural hematoma  6. Urinary retention. Requiring intermittent caths still--around twice daily or more.  - we have increased flomax to .'8mg'$  Probably will need foley at time of placement unless facility can IC 7. Congestive heart failure. Lasix held for low Na, cont losartan Monitoring for any signs of fluid overload   -weights have remained balanced 8. Hyponatremia. Lasix held. Sodium at 131. Follow up labs tomorrow 9. COPD. Continue Spiriva and Flonase.  10. Hypertension: Continue losartan 100 mg daily. Monitor for any signs of orthostasis  11.  Somnolence/confusion: overall patient is confused but not back to baseline  -pseudomonas UTI treated with re culture negative  -sodium trending upward and other labs ok  -only lab abnl is an elevated TSH. Even with the normal T4 this could be a sign of hypothyroid state. Don't know that i would specifically treat  -CT of head shows atrophy and SVD consistent with his age really  -all sedating meds held  -relaxed FR     LOS (Days) 18 A FACE TO  FACE EVALUATION WAS PERFORMED  [name] T 01/29/2012, 7:15 AM

## 2012-01-29 NOTE — Care Management Note (Signed)
Patient ID: Ryan Hess, male   DOB: 1924/07/11, 78 y.o.   MRN: [id number] Notified Carollee Herter at [facility] about [facility] offer & acceptance by family, &, plan for pt to d/c there tomorrow.  Carollee Herter in agreement & aware of need for ambulance txport.

## 2012-01-29 NOTE — Progress Notes (Addendum)
Physical Therapy Session Note  Patient Details  Name: Ryan Hess MRN: [id number] Date of Birth: November 21, 1923  Today's Date: 01/29/2012 Time: 7829-5621 Time Calculation (min): 45 min  Short Term Goals: Week 1:  PT Short Term Goal 1 (Week 1): Pt will be able to transfer min A with RW PT Short Term Goal 1 - Progress (Week 1): Not met PT Short Term Goal 2 (Week 1): Pt will be able to demonstrate dynamic sitting balance with supervision PT Short Term Goal 2 - Progress (Week 1): Partly met (at times has met goal but overall inconsistent) PT Short Term Goal 3 (Week 1): Pt will be able to do curb step with RW to simulate home entry with mod A PT Short Term Goal 3 - Progress (Week 1): Not met Week 2:  PT Short Term Goal 1 (Week 2): Pt will be able to transfer min A with RW  PT Short Term Goal 1 - Progress (Week 2): Met PT Short Term Goal 2 (Week 2): Pt will be able to gait with RW x 50' with min A PT Short Term Goal 2 - Progress (Week 2): Met PT Short Term Goal 3 (Week 2): Pt will be able to do curb step with RW to simulate home entry with mod A PT Short Term Goal 3 - Progress (Week 2): Not met  Skilled Therapeutic Interventions/Progress Updates: w/c propulsion on the unit for general endurance x 150' supervision. Kinetron seated for functional strengthening x 3 minutes; complaint of discomfort on back from chair (repositioned). Dynamic gait activity to look for and collect horseshoes hidden in the room to practice reaching outside his BOS and maintain safety with AD with overall min A and cueing to find the last horseshoe x 60'.      Therapy Documentation Precautions:  Precautions Precautions: Fall Precaution Comments: 96-97% on room air this am during at rest;  80-81% when able to get a reading with activity. O2 applied via [place] Restrictions Weight Bearing Restrictions: No   Pain: Pain Assessment Pain Assessment: No/denies pain Pain Score: 0-No pain Faces Pain Scale: No hurt  See FIM  for current functional status  Therapy/Group: Individual Therapy  Karolee Stamps [facility] 01/29/2012, 1:40 PM

## 2012-01-29 NOTE — Plan of Care (Signed)
Problem: SCI BLADDER ELIMINATION Goal: RH STG MANAGE BLADDER WITH ASSISTANCE STG Manage Bladder With moderate Assistance  Outcome: Not Met (add Reason) Pt requires I/O cath q8hours for no void

## 2012-01-29 NOTE — Discharge Summary (Signed)
  Discharge summary job [id number]

## 2012-01-29 NOTE — Progress Notes (Signed)
Physical Therapy Discharge Summary  Patient Details  Name: Ryan Hess MRN: [id number] Date of Birth: 04-23-1924  Today's Date: 01/29/2012 Time: 1515-1600 Time Calculation (min): 45 min  Individual therapy; Family present to observe therapy session. Practiced stairs up/down 5 steps for functional strengthening and balance with overall min A. Cueing for sit to stand technique needed > 80%. Bed mobility retraining in ADL apartment, supervision (max cues for technique to maintain back precautions) multiple repetitions. Simulated car transfer with RW with overall steady A; cueing for technique and safety. Gait on unit with steady A with RW for functional strengthening and endurance, cues for posture and safety with AD x 120'. Supervision transfer back to bed at end of session to rest and visit with family.  Patient has met 8 of 8 long term goals due to improved activity tolerance, improved balance, increased strength, decreased pain and improved attention.  Patient to discharge at an ambulatory level Min Assist. Plan to d/c to SNF due to decreased caregiver support at home; pt still with altered mental status, though improving requiring mod to max cueing for safety and overall min A.  Reasons goals not met: n/a  Recommendation:  Patient will benefit from ongoing skilled PT services in skilled nursing facility setting to continue to advance safe functional mobility, address ongoing impairments in gait, balance, strength, activity tolerance, and minimize fall risk.  Equipment: No equipment provided. To be provided at [facility].  Reasons for discharge: treatment goals met and discharge from hospital  Patient/family agrees with progress made and goals achieved: Yes  PT Discharge Precautions/Restrictions Precautions Precautions: Fall Restrictions Weight Bearing Restrictions: No Pain No complaints.  Cognition Overall Cognitive Status: Impaired Orientation Level: Oriented to person;Oriented to  place Safety/Judgment: Impaired Sensation Sensation Light Touch: Appears Intact Proprioception: Appears Intact Coordination Gross Motor Movements are Fluid and Coordinated: Yes Motor  Motor Motor: Within Functional Limits    Locomotion  Ambulation Ambulation/Gait Assistance: 4: Min assist  Trunk/Postural Assessment  Cervical Assessment Cervical Assessment: Within Functional Limits Thoracic Assessment Thoracic Assessment:  (flexed posture) Lumbar Assessment Lumbar Assessment:  (back precautions; otherwise WFL) Postural Control Postural Control: Within Functional Limits (decreased balance strategies)  Balance Static Sitting Balance Static Sitting - Level of Assistance: 5: Stand by assistance Dynamic Sitting Balance Dynamic Sitting - Level of Assistance: 5: Stand by assistance Static Standing Balance Static Standing - Level of Assistance: 5: Stand by assistance (with UE support) Dynamic Standing Balance Dynamic Standing - Level of Assistance: 4: Min assist Extremity Assessment      RLE Assessment RLE Assessment:  (grossly 4/5) LLE Assessment LLE Assessment:  (grossly 4/5)  See FIM for current functional status  Karolee Stamps [facility] 01/29/2012, 4:41 PM

## 2012-01-29 NOTE — Discharge Summary (Signed)
Ryan Hess, Ryan Hess NO.:  [id number]  MEDICAL RECORD NO.:  [id number]  LOCATION:  4002                         FACILITY:  [facility]  PHYSICIAN:  Ranelle Oyster, M.D.DATE OF BIRTH:  1924-03-17  DATE OF ADMISSION:  01/11/2012 DATE OF DISCHARGE:  01/30/2012                              DISCHARGE SUMMARY   DISCHARGE DIAGNOSES: 1. Lumbar epidural hematoma with paresis. 2. Subcutaneous heparin for deep vein thrombosis prophylaxis, pain     management, abdominal aortic aneurysm-thoracoabdominal aneurysm-     endovascular repair January 01, 2012. 3. Chronic atrial fibrillation.  Coumadin on hold due to epidural     hematoma. 4. Urinary retention. 5. Congestive heart failure. 6. Pseudomonas urinary tract infection. 7. Hyponatremia. 8. Chronic obstructive pulmonary disease. 9. Hypertension. 10.Hyperlipidemia.  HISTORY OF PRESENT ILLNESS:  This is an 78 year old right-handed white male with past history of atrial fibrillation, chronic Coumadin therapy, and abdominal aortic aneurysm repair in 1998, with progressive thoracoabdominal aneurysm 5.9 cm which electively repaired at [facility] endovascularly on January 01, 2012.  He had a lumbar drain placed for 36 hours was discharged home in stable condition on January 04, 2012. He then presented January 05, 2012, from outside hospital after being seen at [facility] Emergency Room for paraparesis, worsening back pain and lower extremity weakness.  MRI showed lumbar epidural hematoma extending from lumbar L1-L3.  Also noted severe compression of lumbar 2 and 3 nerve roots due to findings of epidural hematoma.  In addition, there was moderately severe spinal stenosis at lumbar L3-4 with moderate stenosis at 4-5.  He was transferred to [facility] at that time per Dr. Tressie Stalker.  He was taken to the operating room on January 05, 2012, for multi-level laminectomy and epidural hematoma evacuation per Dr. Barron Alvine.  Completed  Decadron taper protocol.  Postoperative urinary retention, a Foley catheter tube was inserted and placed on Flomax. Bouts of hyponatremia 126 and monitored.  As for his atrial fibrillation, he was continued on his home dose of atenolol, however, given his recent epidural hematoma and surgeries Coumadin was held.  It was advised per Neurosurgery would be safe to bridge Coumadin with Lovenox, restart aspirin 1 month from the date of surgery which was January 05, 2012.  He had been placed on subcutaneous heparin for DVT prophylaxis.  He was admitted for comprehensive rehab program.  PAST MEDICAL HISTORY:  See discharge diagnoses.  ALLERGIES:  None.  SOCIAL HISTORY:  He lives in [place].  He is married.  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  He is retired.  He was independent prior to admission.  They have a son who lives close by who works.  FUNCTIONAL HISTORY:  Prior to admission was independent, retired.  FUNCTIONAL STATUS:  Upon admission to Rehab Services was minimal assist to contact guard with a rolling walker for short distances, needing minimum to moderate assist for sit to stand.  PHYSICAL EXAMINATION:  VITAL SIGNS:  Blood pressure 112/70, pulse 77, respirations 18, temperature 98.7. GENERAL:  This was an alert male, well developed.  He needed some prompting for place and situation.  He was able to give his age as  well as his wife's name. LUNGS:  Decreased breath sounds.  Clear to auscultation. CARDIAC:  Rate controlled. ABDOMEN:  Soft, nontender.  Good bowel sounds. BACK:  Incision clean and dry, with sutures. NEUROLOGIC:  Motor strength 4/5 bilateral deltoid, biceps, triceps, and grip.  Motor strength 2- in the left hip flexor, 3 in the right hip flexor, 4 bilateral quadriceps, 3 left ankle dorsiflexor, 4 right ankle dorsiflexor.  REHABILITATION HOSPITAL COURSE:  The patient was admitted to Inpatient Rehab Services with therapies initiated on a 3 hour  daily basis consisting of physical therapy, occupational therapy, and rehabilitation nursing.  The following issues were addressed during the patient's rehabilitation stay.  Pertaining to Mr. Blackshire lumbar epidural hematoma with paraparesis, he had undergone 4 level laminectomy evacuation of lumbar epidural hematoma on January 05, 2012.  Surgical site healing nicely.  He would follow up with Dr. Damita [facility] at [facility].  He had been placed on subcutaneous heparin for DVT prophylaxis while at [facility].  The patient noted history of chronic atrial fibrillation on Coumadin therapy prior to admission,  this had not been resumed due to his epidural hematoma.  It was advised to begin Coumadin, aspirin therapy 1 month after dated surgery of January 05, 2012, with bridging of Lovenox until INR greater than 2 and monitoring for any bleeding episodes.  His cardiac rate remained controlled.  He remained on atenolol 50 mg daily.  No chest pain, shortness of breath.  Hospital course complicated by some somnolence, hyponatremia.  A cranial CT scan showed no acute changes.  A urinalysis study was completed showing Pseudomonas treated with a 7 day course of Cipro.  He was still having some difficulties with voiding with intermittent catheterizations every 8 hours.  His Flomax had been increased to 0.8 mg.  In reference again to his somnolence, workup included CT scan that was negative.  Thyroid panel unremarkable except a mild elevation in TSH.  He was not placed on hormone supplement at that time.  His mental status did continue to improve, however, he was still needing prompting and cues for his safety and awareness.  His hyponatremia was stabilized at 131.  He remained on an 1800 mL fluid restriction.  He had been on low-dose Lasix.  This was held due to his hyponatremia.  He exhibited no signs of fluid overload. He did have a history of COPD.  He continued on Spiriva.  Oxygen saturations  greater than 90% on room air.  He was using oxygen on an as- needed basis.  Blood pressure is well controlled with losartan, no hypotension noted.  The patient received weekly collaborative interdisciplinary team conferences to discuss estimated length of stay, family teaching, and any barriers to his discharge.  He was supervision upper body activities of daily living, total assist lower body requiring some cues, minimum to moderate assist overall for his mobility again with cues for safety, lumbar incision with Steri-Strips intact.  The patient was participating with therapies, however, still needing physical assistance.  It was felt by family that skilled nursing facility would be needed with bed becoming available to [facility] Jan 30, 2012, discharge taking place at that time.  LABORATORY DATA:  Latest labs showed a sodium 131, potassium 4.0, BUN 7m creatinine 1.03.  Hemoglobin of 13, hematocrit 38.7, WBC 8.2.  He had a cortisol level that was within normal limits at 16.5.  Latest TSH level of 7.052.  DISCHARGE MEDICATIONS: 1. Tenormin 50 mg p.o. daily. 2. Lipitor  10 mg p.o. daily. 3. Flonase 50 mcg 1 spray twice daily. 4. Mucinex 1200 mg twice daily. 5. Cozaar 100 mg p.o. daily. 6. Multivitamin 1 p.o. daily. 7. Potassium chloride 10 mEq p.o. daily. 8. Flomax 0.8 mg p.o. at bedtime. 9. Spiriva 18 mcg daily.  DIET:  Regular diet, 1800 mL fluid restriction.  SPECIAL INSTRUCTIONS:  The patient should resume Coumadin with aspirin 81 mg daily bridging with subcutaneous Lovenox until INR 2.0 to 3.0 to begin Feb 04, 2012, for history of atrial fibrillation.  The patient's Coumadin had been on hold due to epidural hematoma.  The patient should follow up Dr. Alroy Dust, medical management as needed.  Please call for appointment to schedule this, Dr. Damita Lack, [facility], Dr. Daleen Bo at Outpatient Rehab Service office as directed. Also, the patient should receive  2 L oxygen as needed to keep oxygen saturations greater than 90% and intermittent catheterization every 8 hours if no void. Advised to followup thyroid panel in one month check TSH    Mariam Dollar, P.A.   ______________________________ Ranelle Oyster, M.D.    DA/MEDQ  D:  01/29/2012  T:  01/29/2012  Job:  [id number]  cc:   Lonzo Cloud. Kriste Basque, MD Damita Lack

## 2012-01-29 NOTE — Progress Notes (Signed)
Social Work Patient ID: Ryan Hess, male   DOB: 22-May-1924, 78 y.o.   MRN: [id number]   Received SNF bed offer today from [facility] and [facility] - alerted pt's son who has accepted offer from [place]. This SW spoke with patient about plan to move him tomorrow to [facility] to continue therapy - he is agreeable.  Will plan to transfer via ambulance.  Carlton Buskey

## 2012-01-29 NOTE — Plan of Care (Signed)
Problem: SCI BLADDER ELIMINATION Goal: RH STG MANAGE BLADDER WITH ASSISTANCE STG Manage Bladder With moderate Assistance  Outcome: Not Met (add Reason) Patient requires I/O cath for no void

## 2012-01-29 NOTE — Progress Notes (Signed)
Occupational Therapy Session Notes & Discharge Summary  Patient Details  Name: Ryan Hess MRN: [id number] Date of Birth: 1924-07-02  Today's Date: 01/29/2012  SESSION NOTES  Session #1 [phone] - 55 Minutes Individual Therapy No complaints of pain Upon entering room patient supine in bed. Engaged in bed mobility and functional ambulation from bed -> bathroom using rolling walker with close supervision. Patient then performed shower transfer onto tub transfer bench with close supervision. ADL retraining at shower level in sit/stand position at overall supervision. Patient then performed UB/LB dressing in sit/stand position in recliner and grooming tasks standing at sink. 02 sats checked throughout session = 96-97% during activity and during rest breaks on room air. Left patient seated in w/c beside bed with call bell & phone within reach and quick release belt donned. Patient seems to be clearer cognitively, scored 4-5 on FIM scale.   Session #2 [id number] - 45 Minutes Individual Therapy No complaints of pain Upon entering room patient supine in bed. Engaged in bed mobility for edge of bed -> w/c transfer. 02 sats =100% on 2 liters AND on room air. Therapist then propelled patient -> ADL apartment for tub/shower transfer onto tub transfer bench, toilet transfer, and toileting. Focused on memory, attention, awareness, functional mobility, overall activity tolerance/endurance, and transfers. 02 sats checked at end of session on room air = 99%. Left patient seated in w/c with daughter present.   DISCHARGE SUMMARY  Patient has met 9 of 11 long term goals due to improved activity tolerance, improved balance, ability to compensate for deficits, improved attention, improved awareness and improved coordination.  Patient to discharge at overall supervision -> min assist level .  Patient is to discharge -> SNF for additional rehab services including occupational therapy.   Reasons goals not met:  Patient did not meet simple meal prep goal secondary to did not focus on kitchen tasks during his duration/stay on CIR and patient did not meet LB dressing goal of supervision secondary to patient continues to require assistance to thread left LE. No adaptive equipment introduced at this time secondary to patients decreased cognitive status.   Recommendation:  Patient will benefit from ongoing skilled OT services in skilled nursing facility setting to continue to advance functional skills in the area of BADL, iADL and Reduce care partner burden.  Equipment: No equipment provided secondary to discharge -> SNF at this time.   Reasons for discharge: discharge from hospital  Patient/family agrees with progress made and goals achieved: Yes  OT Discharge Precautions/Restrictions  Precautions Precautions: Fall Precaution Comments: 96-97% on room air this am during activity and at rest Restrictions Weight Bearing Restrictions: No  Pain Pain Assessment Pain Assessment: No/denies pain Pain Score: 0-No pain Faces Pain Scale: No hurt  ADL - See FIM  Vision/Perception  Vision - History Baseline Vision: Wears glasses all the time Patient Visual Report: No change from baseline Perception Perception: Within Functional Limits Praxis Praxis: Intact   Cognition Overall Cognitive Status: Impaired Arousal/Alertness: Awake/alert Orientation Level: Oriented to person (fluctuates, oriented only to self this am) Alternating Attention: Impaired Memory: Impaired Memory Impairment: Decreased recall of new information;Decreased long term memory Awareness: Impaired Problem Solving: Impaired Safety/Judgment: Impaired  Sensation Sensation Light Touch: Appears Intact Coordination Gross Motor Movements are Fluid and Coordinated: Yes Fine Motor Movements are Fluid and Coordinated: Yes  Motor - See Discharge Navigator  Mobility - See Discharge Navigator  Trunk/Postural Assessment - See  Discharge Navigator  Balance- See Discharge Navigator  Extremity/Trunk Assessment RUE  Assessment RUE Assessment: Within Functional Limits LUE Assessment LUE Assessment: Within Functional Limits  See FIM for current functional status  Ryan Hess 01/29/2012, 12:13 PM

## 2012-01-30 DIAGNOSIS — M48061 Spinal stenosis, lumbar region without neurogenic claudication: Secondary | ICD-10-CM

## 2012-01-30 DIAGNOSIS — R339 Retention of urine, unspecified: Secondary | ICD-10-CM

## 2012-01-30 DIAGNOSIS — G8220 Paraplegia, unspecified: Secondary | ICD-10-CM

## 2012-01-30 DIAGNOSIS — Z5189 Encounter for other specified aftercare: Secondary | ICD-10-CM

## 2012-01-30 DIAGNOSIS — N390 Urinary tract infection, site not specified: Secondary | ICD-10-CM

## 2012-01-30 DIAGNOSIS — R4182 Altered mental status, unspecified: Secondary | ICD-10-CM

## 2012-01-30 LAB — BASIC METABOLIC PANEL
BUN: 20 mg/dL (ref 6–23)
CO2: 26 mEq/L (ref 19–32)
Calcium: 9.2 mg/dL (ref 8.4–10.5)
Chloride: 102 mEq/L (ref 96–112)
Creatinine, Ser: 1.03 mg/dL (ref 0.50–1.35)
GFR calc Af Amer: 74 mL/min — ABNORMAL LOW (ref >90)
GFR calc non Af Amer: 64 mL/min — ABNORMAL LOW (ref >90)
Glucose, Bld: 88 mg/dL (ref 70–99)
Potassium: 4.7 mEq/L (ref 3.5–5.1)
Sodium: 135 mEq/L (ref 135–145)

## 2012-01-30 LAB — CBC
HCT: 38.3 % — ABNORMAL LOW (ref 39.0–52.0)
Hemoglobin: 12.9 g/dL — ABNORMAL LOW (ref 13.0–17.0)
MCH: 31.1 pg (ref 26.0–34.0)
MCHC: 33.7 g/dL (ref 30.0–36.0)
MCV: 92.3 fL (ref 78.0–100.0)
Platelets: 183 10*3/uL (ref 150–400)
RBC: 4.15 MIL/uL — ABNORMAL LOW (ref 4.22–5.81)
RDW: 15.6 % — ABNORMAL HIGH (ref 11.5–15.5)
WBC: 7 10*3/uL (ref 4.0–10.5)

## 2012-01-30 NOTE — Progress Notes (Signed)
Patient ID: Ryan Hess, male   DOB: 06-01-1924, 78 y.o.   MRN: [id number] Patient ID: Ryan Hess, male   DOB: October 10, 1923, 78 y.o.   MRN: [id number]  Subjective/Complaints: Review of Systems  All other systems reviewed and are negative.  Up at nursing station today  Objective: Vital Signs: Blood pressure 130/70, pulse 68, temperature 96.6 F (35.9 C), temperature source Axillary, resp. rate 16, height '5\' 9"'$  (1.753 m), weight 83.734 kg (184 lb 9.6 oz), SpO2 100.00%. No results found. Subjective/Complaints:   Objective: Vital Signs: Blood pressure 116/78, pulse 67, temperature 98.5 F (36.9 C), temperature source Oral, resp. rate 16, height '5\' 9"'$  (1.753 m), weight 83.3 kg (183 lb 10.3 oz), SpO2 97.00%. No results found. Results for orders placed during the hospital encounter of 01/11/12 (from the past 72 hour(s))  CBC     Status: Abnormal   Collection Time   01/30/12  5:59 AM      Component Value Range Comment   WBC 7.0  4.0 - 10.5 (K/uL)    RBC 4.15 (*) 4.22 - 5.81 (MIL/uL)    Hemoglobin 12.9 (*) 13.0 - 17.0 (g/dL)    HCT 38.3 (*) 39.0 - 52.0 (%)    MCV 92.3  78.0 - 100.0 (fL)    MCH 31.1  26.0 - 34.0 (pg)    MCHC 33.7  30.0 - 36.0 (g/dL)    RDW 15.6 (*) 11.5 - 15.5 (%)    Platelets 183  150 - 400 (K/uL)   . FIM: FIM - Bathing Bathing Steps Patient Completed: Chest;Right Arm;Left Arm;Abdomen;Front perineal area;Buttocks;Right upper leg;Left upper leg;Right lower leg (including foot);Left lower leg (including foot) Bathing: 5: Supervision: Safety issues/verbal cues  FIM - Upper Body Dressing/Undressing Upper body dressing/undressing steps patient completed: Thread/unthread right sleeve of pullover shirt/dresss;Thread/unthread left sleeve of pullover shirt/dress;Put head through opening of pull over shirt/dress;Pull shirt over trunk Upper body dressing/undressing: 5: Set-up assist to: Obtain clothing/put away FIM - Lower Body Dressing/Undressing Lower body  dressing/undressing steps patient completed: Thread/unthread right pants leg;Pull pants up/down Lower body dressing/undressing: 4: Min-Patient completed 75 plus % of tasks  FIM - Toileting Toileting steps completed by patient: Adjust clothing prior to toileting;Performs perineal hygiene;Adjust clothing after toileting Toileting Assistive Devices: Grab bar or rail for support Toileting: 4: Steadying assist  FIM - Radio producer Devices: Nurse, learning disability Transfers: 4-To toilet/BSC: Min A (steadying Pt. > 75%);4-From toilet/BSC: Min A (steadying Pt. > 75%)  FIM - Bed/Chair Transfer Bed/Chair Transfer Assistive Devices: Arm rests;Walker Bed/Chair Transfer: 5: Supine > Sit: Supervision (verbal cues/safety issues);5: Sit > Supine: Supervision (verbal cues/safety issues);4: Chair or W/C > Bed: Min A (steadying Pt. > 75%);4: Bed > Chair or W/C: Min A (steadying Pt. > 75%)  FIM - Locomotion: Wheelchair Distance: 75 Locomotion: Wheelchair: 5: Travels 150 ft or more: maneuvers on rugs and over door sills with supervision, cueing or coaxing FIM - Locomotion: Ambulation Locomotion: Ambulation Assistive Devices: Administrator Ambulation/Gait Assistance: 4: Min assist Locomotion: Ambulation: 2: Travels 50 - 149 ft with minimal assistance (Pt.>75%)  Comprehension Comprehension Mode: Auditory Comprehension: 4-Understands basic 75 - 89% of the time/requires cueing 10 - 24% of the time  Expression Expression Mode: Verbal Expression: 5-Expresses basic 90% of the time/requires cueing < 10% of the time.  Social Interaction Social Interaction Mode: Asleep Social Interaction: 3-Interacts appropriately 50 - 74% of the time - May be physically or verbally inappropriate.  Problem Solving Problem Solving: 4-Solves basic 75 -  89% of the time/requires cueing 10 - 24% of the time  Memory Memory: 3-Recognizes or recalls 50 - 74% of the time/requires cueing 25 -  49% of the time   01/30/2012,       Alert-slow to initiate early this am HEENT: PERRL, EOMI HRT: RRR, no M,R,G CHEST: clear with w,r,r, Abd: soft, non tender, BS + Neuro: moves all exts', alert to name and town with cues this am. Fairly alert. Strength and sensory exam stable with equal strength all 4's Wound clean and intact with sutures Poor insight and awareness at times   Assessment/Plan: 1. Functional deficits secondary to Paraparesis due to epidural hematoma L2,L3 compression which require 3+ hours per day of interdisciplinary therapy in a comprehensive inpatient rehab setting. Physiatrist is providing close team supervision and 24 hour management of active medical problems listed below. Physiatrist and rehab team continue to assess barriers to discharge/monitor patient progress toward functional and medical goals.  For SNF    2. Anticoagulation/DVT prophylaxis with Pharmaceutical: Heparin  Medical Problem List and Plan:  1. lumbar epidural hematoma with paraparesis. 4 level laminectomy and evacuation of lumbar L2-3 epidural hematoma 01/05/2012  2. DVT Prophylaxis/Anticoagulation: Subcutaneous heparin.  3. Pain Management:  tylenol due to AMS.   4. abdominal aortic aneurysm/thoraco abdominal aneurysm. Endovascularly repaired 01/01/2012. Will need followup at [facility]  5. Chronic atrial fibrillation. Cardiac rate control. Continue atenolol 50 mg daily. Coumadin and aspirin remain on hold for one month from date of surgery 01/05/2012 due to epidural hematoma  6. Urinary retention. Requiring intermittent caths still--around twice daily or more.  - we have increased flomax to .'8mg'$   -IC or foley at discharge. Will discuss with SW re: capabilities of SNF 7. Congestive heart failure. Lasix held for low Na, cont losartan Monitoring for any signs of fluid overload   -weights have remained balanced 8. Hyponatremia. Lasix held. Sodium at 131. Follow up labs tomorrow 9. COPD.  Continue Spiriva and Flonase.  10. Hypertension: Continue losartan 100 mg daily. Monitor for any signs of orthostasis  11.  Somnolence/confusion: overall patient is confused but not back to baseline  -pseudomonas UTI treated with re culture negative  -sodium trending upward and other labs ok  -only lab abnl is an elevated TSH. Even with the normal T4 this could be a sign of hypothyroid state. Would recheck TFT's as an outpt.  -CT of head shows atrophy and SVD consistent with his age really  -all sedating meds held  -relaxed FR     LOS (Days) 19 A FACE TO FACE EVALUATION WAS PERFORMED  Harvey Lingo T 01/30/2012, 7:02 AM

## 2012-01-30 NOTE — Progress Notes (Signed)
Pt discharged at 1125 to [facility](SNF) via ambulance. All belongings sent with patient. No further questions from patient at this time.

## 2012-01-30 NOTE — Progress Notes (Signed)
Discharge Note  The overall goal for the admission was met for:   Discharge location: NO - plan changed to SNF  Length of Stay: Yes - 19 days  Discharge activity level: No   Home/community participation: No  Services provided included: MD, RD, PT, OT, RN, CM, TR, Pharmacy and SW  Financial Services: Private Insurance: [facility] Medicare  Follow-up services arranged: Other: Plan changed to SNF - [facility]  Comments (or additional information):  Patient/Family verbalized understanding of follow-up arrangements: Yes  Individual responsible for coordination of the follow-up plan: son   Confirmed correct DME delivered: NA  Rawlins Stuard

## 2012-02-03 ENCOUNTER — Inpatient Hospital Stay (HOSPITAL_COMMUNITY)
Admission: EM | Admit: 2012-02-03 | Discharge: 2012-02-09 | DRG: 193 | Disposition: A | Discharge status: Home Health | Payer: Medicare Other | Attending: Internal Medicine | Admitting: Internal Medicine

## 2012-02-03 ENCOUNTER — Encounter (HOSPITAL_COMMUNITY): Payer: Self-pay | Admitting: *Deleted

## 2012-02-03 ENCOUNTER — Emergency Department (HOSPITAL_COMMUNITY): Payer: Medicare Other

## 2012-02-03 DIAGNOSIS — J984 Other disorders of lung: Secondary | ICD-10-CM

## 2012-02-03 DIAGNOSIS — E871 Hypo-osmolality and hyponatremia: Secondary | ICD-10-CM

## 2012-02-03 DIAGNOSIS — I10 Essential (primary) hypertension: Secondary | ICD-10-CM

## 2012-02-03 DIAGNOSIS — I712 Thoracic aortic aneurysm, without rupture, unspecified: Secondary | ICD-10-CM

## 2012-02-03 DIAGNOSIS — R5381 Other malaise: Secondary | ICD-10-CM

## 2012-02-03 DIAGNOSIS — S064X9A Epidural hemorrhage with loss of consciousness of unspecified duration, initial encounter: Secondary | ICD-10-CM

## 2012-02-03 DIAGNOSIS — R5383 Other fatigue: Secondary | ICD-10-CM

## 2012-02-03 DIAGNOSIS — J189 Pneumonia, unspecified organism: Principal | ICD-10-CM

## 2012-02-03 DIAGNOSIS — I739 Peripheral vascular disease, unspecified: Secondary | ICD-10-CM

## 2012-02-03 DIAGNOSIS — R339 Retention of urine, unspecified: Secondary | ICD-10-CM

## 2012-02-03 DIAGNOSIS — R0602 Shortness of breath: Secondary | ICD-10-CM

## 2012-02-03 DIAGNOSIS — E860 Dehydration: Secondary | ICD-10-CM

## 2012-02-03 DIAGNOSIS — N179 Acute kidney failure, unspecified: Secondary | ICD-10-CM | POA: Diagnosis present

## 2012-02-03 DIAGNOSIS — D696 Thrombocytopenia, unspecified: Secondary | ICD-10-CM

## 2012-02-03 DIAGNOSIS — M1990 Unspecified osteoarthritis, unspecified site: Secondary | ICD-10-CM

## 2012-02-03 DIAGNOSIS — S064XAA Epidural hemorrhage with loss of consciousness status unknown, initial encounter: Secondary | ICD-10-CM

## 2012-02-03 DIAGNOSIS — I509 Heart failure, unspecified: Secondary | ICD-10-CM

## 2012-02-03 DIAGNOSIS — R1319 Other dysphagia: Secondary | ICD-10-CM

## 2012-02-03 DIAGNOSIS — I4949 Other premature depolarization: Secondary | ICD-10-CM

## 2012-02-03 DIAGNOSIS — C61 Malignant neoplasm of prostate: Secondary | ICD-10-CM

## 2012-02-03 DIAGNOSIS — J4489 Other specified chronic obstructive pulmonary disease: Secondary | ICD-10-CM

## 2012-02-03 DIAGNOSIS — E7800 Pure hypercholesterolemia, unspecified: Secondary | ICD-10-CM

## 2012-02-03 DIAGNOSIS — G9341 Metabolic encephalopathy: Secondary | ICD-10-CM | POA: Diagnosis present

## 2012-02-03 DIAGNOSIS — D126 Benign neoplasm of colon, unspecified: Secondary | ICD-10-CM

## 2012-02-03 DIAGNOSIS — R410 Disorientation, unspecified: Secondary | ICD-10-CM

## 2012-02-03 DIAGNOSIS — K5730 Diverticulosis of large intestine without perforation or abscess without bleeding: Secondary | ICD-10-CM

## 2012-02-03 DIAGNOSIS — I4891 Unspecified atrial fibrillation: Secondary | ICD-10-CM

## 2012-02-03 DIAGNOSIS — L578 Other skin changes due to chronic exposure to nonionizing radiation: Secondary | ICD-10-CM

## 2012-02-03 DIAGNOSIS — R627 Adult failure to thrive: Secondary | ICD-10-CM | POA: Diagnosis present

## 2012-02-03 DIAGNOSIS — G9340 Encephalopathy, unspecified: Secondary | ICD-10-CM

## 2012-02-03 DIAGNOSIS — R531 Weakness: Secondary | ICD-10-CM

## 2012-02-03 DIAGNOSIS — J449 Chronic obstructive pulmonary disease, unspecified: Secondary | ICD-10-CM

## 2012-02-03 LAB — COMPREHENSIVE METABOLIC PANEL
ALT: 25 U/L (ref 0–53)
AST: 28 U/L (ref 0–37)
Albumin: 2.9 g/dL — ABNORMAL LOW (ref 3.5–5.2)
Alkaline Phosphatase: 208 U/L — ABNORMAL HIGH (ref 39–117)
BUN: 30 mg/dL — ABNORMAL HIGH (ref 6–23)
CO2: 24 mEq/L (ref 19–32)
Calcium: 9.4 mg/dL (ref 8.4–10.5)
Chloride: 94 mEq/L — ABNORMAL LOW (ref 96–112)
Creatinine, Ser: 1.35 mg/dL (ref 0.50–1.35)
GFR calc Af Amer: 53 mL/min — ABNORMAL LOW (ref >90)
GFR calc non Af Amer: 46 mL/min — ABNORMAL LOW (ref >90)
Glucose, Bld: 99 mg/dL (ref 70–99)
Potassium: 4.4 mEq/L (ref 3.5–5.1)
Sodium: 129 mEq/L — ABNORMAL LOW (ref 135–145)
Total Bilirubin: 2.6 mg/dL — ABNORMAL HIGH (ref 0.3–1.2)
Total Protein: 6.5 g/dL (ref 6.0–8.3)

## 2012-02-03 LAB — APTT: aPTT: 31 seconds (ref 24–37)

## 2012-02-03 LAB — URINALYSIS, ROUTINE W REFLEX MICROSCOPIC
Bilirubin Urine: NEGATIVE
Glucose, UA: NEGATIVE mg/dL
Ketones, ur: NEGATIVE mg/dL
Leukocytes, UA: NEGATIVE
Nitrite: NEGATIVE
Protein, ur: NEGATIVE mg/dL
Specific Gravity, Urine: 1.016 (ref 1.005–1.030)
Urobilinogen, UA: 0.2 mg/dL (ref 0.0–1.0)
pH: 6 (ref 5.0–8.0)

## 2012-02-03 LAB — DIFFERENTIAL
Basophils Absolute: 0 10*3/uL (ref 0.0–0.1)
Basophils Relative: 0 % (ref 0–1)
Eosinophils Absolute: 0 10*3/uL (ref 0.0–0.7)
Eosinophils Relative: 0 % (ref 0–5)
Lymphocytes Relative: 5 % — ABNORMAL LOW (ref 12–46)
Lymphs Abs: 0.5 10*3/uL — ABNORMAL LOW (ref 0.7–4.0)
Monocytes Absolute: 0.5 10*3/uL (ref 0.1–1.0)
Monocytes Relative: 5 % (ref 3–12)
Neutro Abs: 9.1 10*3/uL — ABNORMAL HIGH (ref 1.7–7.7)
Neutrophils Relative %: 89 % — ABNORMAL HIGH (ref 43–77)

## 2012-02-03 LAB — CBC
HCT: 39 % (ref 39.0–52.0)
Hemoglobin: 13.3 g/dL (ref 13.0–17.0)
MCH: 31.5 pg (ref 26.0–34.0)
MCHC: 34.1 g/dL (ref 30.0–36.0)
MCV: 92.4 fL (ref 78.0–100.0)
Platelets: 174 10*3/uL (ref 150–400)
RBC: 4.22 MIL/uL (ref 4.22–5.81)
RDW: 15.6 % — ABNORMAL HIGH (ref 11.5–15.5)
WBC: 10.1 10*3/uL (ref 4.0–10.5)

## 2012-02-03 LAB — URINE MICROSCOPIC-ADD ON

## 2012-02-03 LAB — POCT I-STAT TROPONIN I: Troponin i, poc: 0 ng/mL (ref 0.00–0.08)

## 2012-02-03 LAB — LACTIC ACID, PLASMA: Lactic Acid, Venous: 1.1 mmol/L (ref 0.5–2.2)

## 2012-02-03 LAB — PROTIME-INR
INR: 1.22 (ref 0.00–1.49)
Prothrombin Time: 15.7 seconds — ABNORMAL HIGH (ref 11.6–15.2)

## 2012-02-03 LAB — PRO B NATRIURETIC PEPTIDE: Pro B Natriuretic peptide (BNP): 4342 pg/mL — ABNORMAL HIGH (ref 0–450)

## 2012-02-03 LAB — D-DIMER, QUANTITATIVE: D-Dimer, Quant: 8.23 ug/mL-FEU — ABNORMAL HIGH (ref 0.00–0.48)

## 2012-02-03 MED ORDER — ENOXAPARIN SODIUM 30 MG/0.3ML ~~LOC~~ SOLN: 30.0000 mg | SUBCUTANEOUS | Status: DC

## 2012-02-03 MED ORDER — TAMSULOSIN HCL 0.4 MG PO CAPS
0.8000 mg | ORAL_CAPSULE | Freq: Every day | ORAL | Status: DC
  Administered 2012-02-04 – 2012-02-08 (×5): 0.8 mg via ORAL
  Filled 2012-02-03 (×7): qty 2

## 2012-02-03 MED ORDER — ACETAMINOPHEN 325 MG PO TABS: 650.0000 mg | ORAL_TABLET | Freq: Four times a day (QID) | ORAL | Status: DC | PRN

## 2012-02-03 MED ORDER — LEVOFLOXACIN IN D5W 750 MG/150ML IV SOLN: 750.0000 mg | INTRAVENOUS | Status: DC

## 2012-02-03 MED ORDER — CEFEPIME HCL 1 G IJ SOLR: 1.0000 g | Freq: Three times a day (TID) | INTRAMUSCULAR | Status: DC

## 2012-02-03 MED ORDER — GUAIFENESIN ER 600 MG PO TB12
600.0000 mg | ORAL_TABLET | Freq: Two times a day (BID) | ORAL | Status: DC
  Administered 2012-02-04 – 2012-02-09 (×10): 600 mg via ORAL
  Filled 2012-02-03 (×13): qty 1

## 2012-02-03 MED ORDER — ATORVASTATIN CALCIUM 10 MG PO TABS
10.0000 mg | ORAL_TABLET | Freq: Every day | ORAL | Status: DC
  Administered 2012-02-04 – 2012-02-08 (×5): 10 mg via ORAL
  Filled 2012-02-03 (×7): qty 1

## 2012-02-03 MED ORDER — ATENOLOL 50 MG PO TABS
50.0000 mg | ORAL_TABLET | Freq: Every day | ORAL | Status: DC
  Administered 2012-02-04 – 2012-02-07 (×3): 50 mg via ORAL
  Filled 2012-02-03 (×4): qty 1

## 2012-02-03 MED ORDER — VANCOMYCIN HCL 1000 MG IV SOLR
1250.0000 mg | INTRAVENOUS | Status: DC
  Filled 2012-02-03: qty 1250

## 2012-02-03 MED ORDER — SODIUM CHLORIDE 0.9 % IV SOLN: INTRAVENOUS | Status: DC

## 2012-02-03 MED ORDER — VANCOMYCIN HCL IN DEXTROSE 1-5 GM/200ML-% IV SOLN
1000.0000 mg | Freq: Once | INTRAVENOUS | Status: DC
  Filled 2012-02-03: qty 200

## 2012-02-03 MED ORDER — TIOTROPIUM BROMIDE MONOHYDRATE 18 MCG IN CAPS
18.0000 ug | ORAL_CAPSULE | Freq: Every day | RESPIRATORY_TRACT | Status: DC
  Administered 2012-02-04 – 2012-02-09 (×4): 18 ug via RESPIRATORY_TRACT
  Filled 2012-02-03: qty 5

## 2012-02-03 MED ORDER — ONDANSETRON HCL 4 MG PO TABS: 4.0000 mg | ORAL_TABLET | Freq: Four times a day (QID) | ORAL | Status: DC | PRN

## 2012-02-03 MED ORDER — PIPERACILLIN-TAZOBACTAM 3.375 G IVPB
3.3750 g | Freq: Once | INTRAVENOUS | Status: AC
  Administered 2012-02-03: 3.375 g via INTRAVENOUS
  Filled 2012-02-03: qty 50

## 2012-02-03 MED ORDER — DEXTROSE 5 % IV SOLN
1.0000 g | Freq: Two times a day (BID) | INTRAVENOUS | Status: DC
  Administered 2012-02-04: 1 g via INTRAVENOUS
  Filled 2012-02-03 (×2): qty 1

## 2012-02-03 MED ORDER — ONDANSETRON HCL 4 MG/2ML IJ SOLN: 4.0000 mg | Freq: Four times a day (QID) | INTRAMUSCULAR | Status: DC | PRN

## 2012-02-03 MED ORDER — VANCOMYCIN HCL 1000 MG IV SOLR
1250.0000 mg | INTRAVENOUS | Status: DC
  Administered 2012-02-03: 1250 mg via INTRAVENOUS
  Filled 2012-02-03: qty 1250

## 2012-02-03 MED ORDER — ENOXAPARIN SODIUM 40 MG/0.4ML ~~LOC~~ SOLN
40.0000 mg | SUBCUTANEOUS | Status: DC
  Administered 2012-02-03 – 2012-02-08 (×6): 40 mg via SUBCUTANEOUS
  Filled 2012-02-03 (×7): qty 0.4

## 2012-02-03 MED ORDER — SODIUM CHLORIDE 0.9 % IV BOLUS (SEPSIS)
500.0000 mL | INTRAVENOUS | Status: AC
  Administered 2012-02-03: 500 mL via INTRAVENOUS

## 2012-02-03 MED ORDER — IOHEXOL 300 MG/ML  SOLN
100.0000 mL | Freq: Once | INTRAMUSCULAR | Status: AC | PRN
  Administered 2012-02-03: 100 mL via INTRAVENOUS

## 2012-02-03 MED ORDER — ACETAMINOPHEN 650 MG RE SUPP: 650.0000 mg | Freq: Four times a day (QID) | RECTAL | Status: DC | PRN

## 2012-02-03 MED ORDER — SODIUM CHLORIDE 0.9 % IV SOLN
Freq: Once | INTRAVENOUS | Status: AC
  Administered 2012-02-03: 19:00:00 via INTRAVENOUS

## 2012-02-03 MED ORDER — ASPIRIN EC 81 MG PO TBEC
81.0000 mg | DELAYED_RELEASE_TABLET | Freq: Every day | ORAL | Status: DC
  Administered 2012-02-04 – 2012-02-09 (×6): 81 mg via ORAL
  Filled 2012-02-03 (×7): qty 1

## 2012-02-03 MED ORDER — SODIUM CHLORIDE 0.9 % IV SOLN
INTRAVENOUS | Status: DC
  Administered 2012-02-03 – 2012-02-09 (×6): via INTRAVENOUS

## 2012-02-03 MED ORDER — LEVOFLOXACIN IN D5W 750 MG/150ML IV SOLN
750.0000 mg | INTRAVENOUS | Status: DC
  Administered 2012-02-03: 750 mg via INTRAVENOUS
  Filled 2012-02-03: qty 150

## 2012-02-03 NOTE — ED Notes (Signed)
Pt had an aneurysm repair and ended up having a hematoma in his spine which he got a lumbarectomy for in [facility]. He's been weak for 2 days. He has difficulty urinating. He had to get cathed. Two days ago he had difficulty talking, couldn't keep his eyes open. Yesterday the son found out his thyroid gland was having problems.He's been in [facility] for 5 years.

## 2012-02-03 NOTE — ED Provider Notes (Signed)
History     CSN: [id number]  Arrival date & time 02/03/12  73   First MD Initiated Contact with Patient 02/03/12 1500      Chief Complaint  Patient presents with  . Extremity Weakness  . Tremors   Level V caveat for confusion  (Consider location/radiation/quality/duration/timing/severity/associated sxs/prior treatment) HPI  Patient had a triple AAA repair done in 1998. About 3-4 weeks ago he went to [facility] to have revision of his aneurysm repair complicated by a epidural hematoma it was also drained there. Since then he's been weak in his legs and having difficulty urinating. His son states he has been weak since the surgery and has not regained his strength. He relates his father is now confused" his mind is foggy". He relates over the past 2 days he's noticed that he's getting worse. He states today when he went to the rehabilitation facility his father had just had a shower and was sitting in a chair shaking and complained of being cold. He relates it covered he muffled blankets however he did not improve until they put a Foley catheter in him and drained his bladder 600 cc urine. However they did remove the Foley catheter after that. Patient has had decreased appetite. Patient states he has shortness of breath. He also is complaining of being weak. Son states he had thyroid test done and yesterday he was told his thyroid was not working properly he was also told that a blood test showed them he was having excess fluid. The patient however complains of being very dry. Cells reports he's been basically sleeping for the past 2 days.  PCP Dr. Teressa Lower Cardiologist Dr. Johnsie Cancel CVSDr Servando Snare   Past Medical History  Diagnosis Date  . COPD (chronic obstructive pulmonary disease)   . Pneumonia, organism unspecified   . Pulmonary nodule   . HTN (hypertension)   . CHF (congestive heart failure)   . Atrial fibrillation   . Drug therapy   . PVC (premature ventricular contraction)   . PVD  (peripheral vascular disease)   . Thoracic aortic aneurysm   . Hypercholesteremia   . Diverticulosis of colon   . Colon polyps   . Prostate cancer   . DJD (degenerative joint disease)   . Actinic skin damage   . Hyperlipidemia   . Internal hemorrhoids     Past Surgical History  Procedure Date  . Total knee arthroplasty     bilateral  . Abdominal aortic aneurysm repair 1998  . Appendectomy   . Inguinal hernia repair     left  . Joint replacement     Family History  Problem Relation Age of Onset  . Dementia Mother   . Stroke Father   . Diabetes Brother   . Heart disease Brother   . Heart disease Sister   . Heart disease Brother   . Obesity Brother   . Cancer Sister     back ??  . Alzheimer's disease Sister   . Colon cancer Neg Hx     History  Substance Use Topics  . Smoking status: Former Smoker -- 2.0 packs/day for 44 years    Types: Cigarettes    Quit date: 09/25/1988  . Smokeless tobacco: Never Used  . Alcohol Use: No   Was living independently at home, currently in rehab facility   Review of Systems  All other systems reviewed and are negative.    Allergies  Review of patient's allergies indicates no known allergies.  Home Medications  Patient's Medications  New Prescriptions   No medications on file  Previous Medications   ASPIRIN 81 MG TABLET    Take 81 mg by mouth daily.     ATENOLOL (TENORMIN) 50 MG TABLET    Take 50 mg by mouth daily.   ATORVASTATIN (LIPITOR) 10 MG TABLET    Take 10 mg by mouth daily.   ENOXAPARIN SODIUM (LOVENOX )    Inject 1 each into the skin as directed.    FUROSEMIDE (LASIX) 40 MG TABLET    Take 40 mg by mouth daily.   GUAIFENESIN (MUCINEX) 600 MG 12 HR TABLET    Take 600 mg by mouth 2 (two) times daily. COPD.   LOSARTAN (COZAAR) 100 MG TABLET    Take 100 mg by mouth daily.   MULTIPLE VITAMIN (MULTIVITAMIN) TABLET    Take 1 tablet by mouth daily.     SPIRIVA HANDIHALER 18 MCG INHALATION CAPSULE    INHALE THE  CONTENTS OF 1 CAPSULE BY HANDIHALER DAILY   TAMSULOSIN HCL (FLOMAX) 0.4 MG CAPS    Take 0.8 mg by mouth at bedtime.   WARFARIN SODIUM (COUMADIN PO)    Take by mouth.  Modified Medications   No medications on file  Discontinued Medications   FLUTICASONE-SALMETEROL (ADVAIR DISKUS) 250-50 MCG/DOSE AEPB    Inhale 1 puff into the lungs every 12 (twelve) hours.   FUROSEMIDE (LASIX) 20 MG TABLET    Take 20 mg by mouth 2 (two) times daily.   POTASSIUM CHLORIDE (K-DUR) 10 MEQ TABLET    Take 10 mEq by mouth 2 (two) times daily.   WARFARIN (COUMADIN) 2.5 MG TABLET    Take 2.5 mg by mouth daily.     BP 101/67  Pulse 78  Temp(Src) 98.3 F (36.8 C) (Oral)  Resp 23  SpO2 92%  Vital signs normal    Physical Exam  Nursing note and vitals reviewed. Constitutional: He appears well-developed and well-nourished.  Non-toxic appearance. He does not appear ill. No distress.  HENT:  Head: Normocephalic and atraumatic.  Right Ear: External ear normal.  Left Ear: External ear normal.  Nose: Nose normal. No mucosal edema or rhinorrhea.  Mouth/Throat: Mucous membranes are normal. No dental abscesses or uvula swelling.       Tongue very dry, appears smooth and red  Eyes: Conjunctivae and EOM are normal. Pupils are equal, round, and reactive to light.  Neck: Normal range of motion and full passive range of motion without pain. Neck supple.  Cardiovascular: Normal rate, regular rhythm and normal heart sounds.  Exam reveals no gallop and no friction rub.   No murmur heard. Pulmonary/Chest: Effort normal and breath sounds normal. No respiratory distress. He has no wheezes. He has no rhonchi. He has no rales. He exhibits no tenderness and no crepitus.       Patient has mild tachypnea  Abdominal: Soft. Normal appearance and bowel sounds are normal. He exhibits no distension. There is no tenderness. There is no rebound and no guarding.  Musculoskeletal: Normal range of motion. He exhibits no edema and no  tenderness.       Moves all extremities well.   Neurological: He is alert. He has normal strength. No cranial nerve deficit.       Patient will answer simple questions appropriately.  Skin: Skin is warm, dry and intact. No rash noted. No erythema. No pallor.  Psychiatric: His speech is normal and behavior is normal. His mood appears not anxious.  Patient's is sleeping, easily awakened however he falls back to sleep easily.    ED Course  Procedures (including critical care time)   Medications  0.9 %  sodium chloride infusion (not administered)  sodium chloride 0.9 % bolus 500 mL (not administered)  vancomycin (VANCOCIN) IVPB 1000 mg/200 mL premix (not administered)  piperacillin-tazobactam (ZOSYN) IVPB 3.375 g (not administered)  0.9 %  sodium chloride infusion (not administered)  iohexol (OMNIPAQUE) 300 MG/ML solution 100 mL (100 mL Intravenous Contrast Given 02/03/12 1749)   18:41 Dr Broadus John, admit to tele, team 2  Results for orders placed during the hospital encounter of 02/03/12  CBC      Component Value Range   WBC 10.1  4.0 - 10.5 (K/uL)   RBC 4.22  4.22 - 5.81 (MIL/uL)   Hemoglobin 13.3  13.0 - 17.0 (g/dL)   HCT 39.0  39.0 - 52.0 (%)   MCV 92.4  78.0 - 100.0 (fL)   MCH 31.5  26.0 - 34.0 (pg)   MCHC 34.1  30.0 - 36.0 (g/dL)   RDW 15.6 (*) 11.5 - 15.5 (%)   Platelets 174  150 - 400 (K/uL)  DIFFERENTIAL      Component Value Range   Neutrophils Relative 89 (*) 43 - 77 (%)   Neutro Abs 9.1 (*) 1.7 - 7.7 (K/uL)   Lymphocytes Relative 5 (*) 12 - 46 (%)   Lymphs Abs 0.5 (*) 0.7 - 4.0 (K/uL)   Monocytes Relative 5  3 - 12 (%)   Monocytes Absolute 0.5  0.1 - 1.0 (K/uL)   Eosinophils Relative 0  0 - 5 (%)   Eosinophils Absolute 0.0  0.0 - 0.7 (K/uL)   Basophils Relative 0  0 - 1 (%)   Basophils Absolute 0.0  0.0 - 0.1 (K/uL)  COMPREHENSIVE METABOLIC PANEL      Component Value Range   Sodium 129 (*) 135 - 145 (mEq/L)   Potassium 4.4  3.5 - 5.1 (mEq/L)   Chloride 94  (*) 96 - 112 (mEq/L)   CO2 24  19 - 32 (mEq/L)   Glucose, Bld 99  70 - 99 (mg/dL)   BUN 30 (*) 6 - 23 (mg/dL)   Creatinine, Ser 1.35  0.50 - 1.35 (mg/dL)   Calcium 9.4  8.4 - 10.5 (mg/dL)   Total Protein 6.5  6.0 - 8.3 (g/dL)   Albumin 2.9 (*) 3.5 - 5.2 (g/dL)   AST 28  0 - 37 (U/L)   ALT 25  0 - 53 (U/L)   Alkaline Phosphatase 208 (*) 39 - 117 (U/L)   Total Bilirubin 2.6 (*) 0.3 - 1.2 (mg/dL)   GFR calc non Af Amer 46 (*) >90 (mL/min)   GFR calc Af Amer 53 (*) >90 (mL/min)  APTT      Component Value Range   aPTT 31  24 - 37 (seconds)  PROTIME-INR      Component Value Range   Prothrombin Time 15.7 (*) 11.6 - 15.2 (seconds)   INR 1.22  0.00 - 1.49   URINALYSIS, ROUTINE W REFLEX MICROSCOPIC      Component Value Range   Color, Urine YELLOW  YELLOW    APPearance CLEAR  CLEAR    Specific Gravity, Urine 1.016  1.005 - 1.030    pH 6.0  5.0 - 8.0    Glucose, UA NEGATIVE  NEGATIVE (mg/dL)   Hgb urine dipstick MODERATE (*) NEGATIVE    Bilirubin Urine NEGATIVE  NEGATIVE    Ketones, ur  NEGATIVE  NEGATIVE (mg/dL)   Protein, ur NEGATIVE  NEGATIVE (mg/dL)   Urobilinogen, UA 0.2  0.0 - 1.0 (mg/dL)   Nitrite NEGATIVE  NEGATIVE    Leukocytes, UA NEGATIVE  NEGATIVE   LACTIC ACID, PLASMA      Component Value Range   Lactic Acid, Venous 1.1  0.5 - 2.2 (mmol/L)  D-DIMER, QUANTITATIVE      Component Value Range   D-Dimer, Quant 8.23 (*) 0.00 - 0.48 (ug/mL-FEU)  PRO B NATRIURETIC PEPTIDE      Component Value Range   Pro B Natriuretic peptide (BNP) 4342.0 (*) 0 - 450 (pg/mL)  POCT I-STAT TROPONIN I      Component Value Range   Troponin i, poc 0.00  0.00 - 0.08 (ng/mL)   Comment 3           URINE MICROSCOPIC-ADD ON      Component Value Range   WBC, UA 0-2  <3 (WBC/hpf)   RBC / HPF 3-6  <3 (RBC/hpf)   Bacteria, UA FEW (*) RARE    Laboratory interpretation all normal except elevated BNP from baseline of 5-600, very elevated d-dimer, hyponatremia, low chloride  Ct Abdomen Pelvis Wo  Contrast  02/03/2012  *RADIOLOGY REPORT*  Clinical Data: Extreme weakness, recent AAA repair  CT ABDOMEN AND PELVIS WITHOUT CONTRAST  Technique:  Multidetector CT imaging of the abdomen and pelvis was performed following the standard protocol without intravenous contrast.  Comparison: CT abdomen 10 /18/2011  Findings: There is bibasilar air space disease which is slightly increased compared to prior.  No pericardial fluid.  There is an aortic stent graft in the descending thoracic aorta. There  arebi- iliac stent grafts distally.  No focal hepatic lesion.  Gallbladder, pancreas, spleen, and adrenal glands are normal.  There is mild hydronephrosis of the right kidney.  Left kidney is normal.  The bladder is distended. Bladder distention and right hydronephrosis are new from prior.  There is no free fluid the pelvis.  Prostate gland demonstrate brachytherapy seeds inferiorly.  No pelvic lymphadenopathy. Review of  bone windows demonstrates no aggressive osseous lesions.  The IMPRESSION:  1.  New bladder distention and mild  right hydronephrosis. 2.  Thoracic stent graft without complication. 3.  Bibasilar air space disease.  Favor edema over pneumonia or aspiration pneumonitis.  Original Report Authenticated By: Suzy Bouchard, M.D.   Ct Head Wo Contrast  02/03/2012  *RADIOLOGY REPORT*  Clinical Data: Extremity weakness and tremors.  CT HEAD WITHOUT CONTRAST  Technique:  Contiguous axial images were obtained from the base of the skull through the vertex without contrast.  Comparison: CT head without contrast 01/23/2012.  Findings: Atrophy and extensive white matter changes are similar to the prior study.  There are remote lacunar infarcts of the basal ganglia, particularly in the right caudate head and thalamus.  No acute cortical infarct, hemorrhage, mass lesion is present.  The ventricles are proportionate to the degree of atrophy.  No significant extra-axial fluid collection is present.  The paranasal sinuses  are clear.  Sclerosis of the left mastoid air cells is again noted.  The osseous skull is intact.  IMPRESSION:  1.  No acute intracranial abnormality or significant interval change. 2.  Stable atrophy and extensive white matter disease.  This likely reflects the sequelae of chronic microvascular ischemia.  Original Report Authenticated By: Resa Miner. MATTERN, M.D.   Ct Angio Chest W/cm &/or Wo Cm  02/03/2012  *RADIOLOGY REPORT*  Clinical Data: Status post thoracic aneurysm  repair.  Elevated D- dimer.  Hypotension.  CT ANGIOGRAPHY CHEST  Technique:  Multidetector CT imaging of the chest using the standard protocol during bolus administration of intravenous contrast. Multiplanar reconstructed images including MIPs were obtained and reviewed to evaluate the vascular anatomy.  Contrast: 146m OMNIPAQUE IOHEXOL 300 MG/ML  SOLN  Comparison: CT chest 08/31/2011.  Plain film chest 02/03/2012.  Findings: No pulmonary embolus is identified.  Stent graft in the descending thoracic aorta is seen.  There is cardiomegaly.  No pleural or pericardial effusion.  No axillary, hilar or mediastinal lymphadenopathy.  There is some fluid in the esophagus compatible with poor motility and/or reflux.  Lungs demonstrate extensive centrilobular emphysema.  The patient has airspace disease posterior of the lung bases, worse on the left.  Incidentally imaged upper abdomen demonstrates low attenuation lesion the right kidney most consistent with a cyst which is partially visualized.  There is no focal bony abnormality.  IMPRESSION:  1.  Negative for pulmonary embolus. 2.  Left worse than right basilar airspace disease worrisome for pneumonia or less likely aspiration, particularly on the left. 3.  Status post thoracic aortic aneurysm repair with a stent graft in place. 4.  Cardiomegaly. 5.  Fluid in the esophagus compatible with fluid tali reflux. 6.  Emphysema.  Original Report Authenticated By: TArvid Right DLuther Parody M.D.   Dg Chest  Port 1 View  02/03/2012  *RADIOLOGY REPORT*  Clinical Data: Atrial fibrillation  PORTABLE CHEST - 1 VIEW  Comparison: Chest radiograph 01/16/2012  Findings: Thoracic stent graft noted.  Stable enlarged heart silhouette.  There is bilateral air space disease worse in the left lower lobe.  Upper lungs are relatively clear.  IMPRESSION: Asymmetric bibasilar air space disease representing edema or infection.  Original Report Authenticated By: SSuzy Bouchard M.D.     Mr Lumbar Spine W Wo Contrast  01/05/2012  *RADIOLOGY REPORT*  Clinical Data: Status post endovascular aneurysm repair.  In discussion with the family patient had some sort of spinal catheter placed for at least 2 days following this procedure.  [facility]  He was able to ambulate yesterday in his hospital room prior to discharge.  Upon returning home, the patient fell getting out of his car and was unable to ambulate.  MRI LUMBAR SPINE WITHOUT AND WITH CONTRAST  Technique:  Multiplanar and multiecho pulse sequences of the lumbar spine were obtained without and with intravenous contrast.  Contrast: 124mMULTIHANCE GADOBENATE DIMEGLUMINE 529 MG/ML IV SOLN  Comparison: Lumbar spine radiographs 01/05/2012.  CTA of the abdomen and pelvis 07/12/2010.  Findings: Normal signal is present in the conus medullaris which terminates at L1, within normal limits.  There is artifact from the patient's recent aneurysm repair at the T12-L1 level.  An extensive multiloculated fluid collection is present within the spinal canal extending from at least the superior plate of L1 through the midbody of L3, measuring at least 8.5 cm.  There is a lower intensity T2 component anteriorly spanning from the midbody of L1 through the L2-3 disc space.  This component demonstrates some T1 shortening precontrast.  These collections create significant mass effect with to severe compression of the residual nerves at L2-3.  Facet  hypertrophy and disc herniation contribute.  There is no definite component of the hematoma below the L3-4 disc level.  The hematoma does not significantly compress the conus medullaris, nor is there any abnormal signal in the conus.  L1-2:  A hematoma extends centrally into  the left along the ventral aspect of the canal.  Moderate facet hypertrophy is present.  There is a broad-based disc herniation and slight retrolisthesis.  L2-3:  Please see above.  L3-4:  A broad-based disc herniation is present.  Moderate facet hypertrophy is evident.  This results in moderate central canal stenosis.  Hematoma terminates just above this level.  Mild foraminal stenosis is present bilaterally.  L4-5:  6 mm anterolisthesis is present at L4-5 with uncovering of the disc.  Advanced facet hypertrophy and ligament flavum thickening is noted.  This results in moderate central canal stenosis with left greater than right lateral recess narrowing. Mild foraminal narrowing is present on the left.  L5-S1:  A leftward disc herniation is present.  Moderate facet hypertrophy is worse on the left.  This results in mild left lateral recess narrowing.  IMPRESSION:  1.  Extensive multiloculated fluid collection within the spinal canal is most compatible with a hematoma.  It is unclear if this hematoma is intrathecal or epidural.  I favor intrathecal. 2.  Hematoma results in severe spinal stenosis, greatest at L2-3. 3.  The conus medullaris appears to be within normal limits with hematoma extending only to the inferior tip of the conus, but no abnormal signal. 4.  Moderate spondylosis in the lower lumbar spine with moderate central canal stenosis at L3-4 and L4-5 due to disc herniation and facet disease. 5.  Mild left lateral recess narrowing at L5-S1.  Critical Value/emergent results were called by telephone at the time of interpretation on 01/05/2012  at 09:50 a.m.  to  Dr. Vanessa Kick, who verbally acknowledged these results.  Original Report  Authenticated By: Resa Miner. MATTERN, M.D.    Date: 02/03/2012  Rate: 86  Rhythm: atrial fibrillation  QRS Axis: left  Intervals: normal  ST/T Wave abnormalities: nonspecific T wave changes  Conduction Disutrbances:none  Narrative Interpretation:   Old EKG Reviewed: unchanged from 01/05/12    1. Healthcare-associated pneumonia   2. Weakness   3. Confusion   4. Urinary retention   5. Hyponatremia     Plan admission  Rolland Porter, MD, FACEP   MDM although patient's BMP is now 4000 with baseline of 500-600 clinically he appears to be dehydrated with dry tongue, concentrated urine, low sodium, low chloride, and rising BUN and creatinine. He was given a 500 cc bolus of fluid and will slowly hydrate him. Patient was started on antibiotics for his healthcare associated pneumonia. This may account for some of his confusion.           Janice Norrie, MD 02/03/12 [phone]

## 2012-02-03 NOTE — Progress Notes (Addendum)
ANTIBIOTIC CONSULT NOTE - INITIAL  Pharmacy Consult for Vancomycin/Abx renal adjustment Indication: suspected PNA  No Known Allergies  Patient Measurements: Height: 5\' 9"  (175.3 cm) (as reported 01/11/12) Weight: 183 lb 10.3 oz (83.3 kg) (as reported 01/24/12) IBW/kg (Calculated) : 70.7 kg  Vital Signs: Temp: 97.6 F (36.4 C) (05/11 2103) Temp src: Oral (05/11 2103) BP: 104/65 mmHg (05/11 2103) Pulse Rate: 67  (05/11 2103) Intake/Output from previous day:   Intake/Output from this shift:    Labs:  Basename 02/03/12 1630  WBC 10.1  HGB 13.3  PLT 174  LABCREA --  CREATININE 1.35   Estimated Creatinine Clearance: 39.3 ml/min (by C-G formula based on Cr of 1.35). No results found for this basename: VANCOTROUGH:2,VANCOPEAK:2,VANCORANDOM:2,GENTTROUGH:2,GENTPEAK:2,GENTRANDOM:2,TOBRATROUGH:2,TOBRAPEAK:2,TOBRARND:2,AMIKACINPEAK:2,AMIKACINTROU:2,AMIKACIN:2, in the last 72 hours   Microbiology: Recent Results (from the past 720 hour(s))  MRSA PCR SCREENING     Status: Normal   Collection Time   01/11/12  8:49 PM      Component Value Range Status Comment   MRSA by PCR NEGATIVE  NEGATIVE  Final   URINE CULTURE     Status: Normal   Collection Time   01/16/12  4:26 PM      Component Value Range Status Comment   Specimen Description URINE, CATHETERIZED   Final    Special Requests NONE   Final    Culture  Setup Time [id number]   Final    Colony Count 80,000 COLONIES/ML   Final    Culture PSEUDOMONAS AERUGINOSA   Final    Report Status 01/19/2012 FINAL   Final    Organism ID, Bacteria PSEUDOMONAS AERUGINOSA   Final   URINE CULTURE     Status: Normal   Collection Time   01/25/12  1:58 PM      Component Value Range Status Comment   Specimen Description URINE, CATHETERIZED   Final    Special Requests NONE   Final    Culture  Setup Time [id number]   Final    Colony Count NO GROWTH   Final    Culture NO GROWTH   Final    Report Status 01/26/2012 FINAL   Final     Medical  History: Past Medical History  Diagnosis Date  . COPD (chronic obstructive pulmonary disease)   . Pneumonia, organism unspecified   . Pulmonary nodule   . HTN (hypertension)   . CHF (congestive heart failure)   . Atrial fibrillation   . Drug therapy   . PVC (premature ventricular contraction)   . PVD (peripheral vascular disease)   . Thoracic aortic aneurysm   . Hypercholesteremia   . Diverticulosis of colon   . Colon polyps   . Prostate cancer   . DJD (degenerative joint disease)   . Actinic skin damage   . Hyperlipidemia   . Internal hemorrhoids     Medications:  Scheduled:    . sodium chloride   Intravenous Once  . atenolol  50 mg Oral Daily  . piperacillin-tazobactam (ZOSYN)  IV  3.375 g Intravenous Once  . sodium chloride  500 mL Intravenous STAT  . vancomycin  1,000 mg Intravenous Once   Infusions:    . sodium chloride     PRN: iohexol Assessment: Pt admitted with increased confusion/hallucination, SOB/congestion/chills/shaking. CT concerning for PNA. Pt   Goal of Therapy:  Vancomycin trough level 15-20 mcg/ml  Plan:  Vancomycin 1250 mg Q24h x 8 days starting tonight. (Vanco 1gm was not given in ER) Will check Vanco trough level at steady  state or if renal function changes Will change Cefepime to 1 Gm Q12h and Levaquin 750mg  IV Q48h x 2 doses  Dorethea Clan 02/03/2012,9:04 PM   ADDENDUM: 02/04/2012 12:03 PM SCr improved overnight: 1.19 < 1.35, CrCl(N) 45 ml/min, CrCl(CG) 52 ml/min, warranting dose adjustments to antibiotics.  Anticipate further improvement in SCr tomorrow, patient is having great UOP. Plan:  1. Adjust vancomycin to 750 mg IV q12h. 2. Adjust Levaquin to 750 mg IV q24h. 3. Adjust Cefepime to 1g IV q8h.  Clance Boll, PharmD, BCPS Pager: [phone] 02/04/2012 12:05 PM

## 2012-02-03 NOTE — H&P (Addendum)
PCP:   Noralee Space, MD, MD   Chief Complaint:  Multiple complaints  HPI: Ryan Hess is an 78 year old gentleman with a complex past medical history significant for recent endovascular repair of thoracoabdominal aneurysm on 4/8 at [facility], this was complicated by a lumbar epidural hematoma with paraparesis, subsequently underwent laminectomy and evacuation of the epidural hematoma on 4/12 again at [facility], was then discharged to Inpatient rehabilitation at [facility], and then discharged from there to SNF 5days ago.  Has had issues related to urinary retention since the epidural hematoma, and gets intermittent I&O catheterizations. Patient is here with his son reports a progressive cognitive decline over the last month, significantly worsened yesterday with increased confusion and hallucination. He denies any fevers or chills, denies nausea, vomiting, abdominal pain, diarrhea or dysuria. Denies any changes in medications. Son reports seeing him today where he was noted to be slightly more short of breath with congestion and was having chills and shaking and was subsequently brought to the emergency room for evaluation. Had a Foley catheter placed in the emergency room which drained 1500 cc of clear urine, also found to have a sodium of 129 and a CT angiogram of the chest concerning for pneumonia  Allergies:  No Known Allergies    Past Medical History  Diagnosis Date  . COPD (chronic obstructive pulmonary disease)   . Pneumonia, organism unspecified   . Pulmonary nodule   . HTN (hypertension)   . CHF (congestive heart failure)   . Atrial fibrillation   . Drug therapy   . PVC (premature ventricular contraction)   . PVD (peripheral vascular disease)   . Thoracic aortic aneurysm   . Hypercholesteremia   . Diverticulosis of colon   . Colon polyps   . Prostate cancer   . DJD (degenerative joint disease)   . Actinic skin damage   . Hyperlipidemia   . Internal hemorrhoids     Endovascular repair of thoracoabdominal aortic aneurysm 01/01/12 Epidural hematoma status post lumbar laminectomy and evacuation 01/05/12  Past Surgical History  Procedure Date  . Total knee arthroplasty     bilateral  . Abdominal aortic aneurysm repair 1998  . Appendectomy   . Inguinal hernia repair     left  . Joint replacement     Prior to Admission medications   Medication Sig Start Date End Date Taking? Authorizing Provider  atenolol (TENORMIN) 50 MG tablet Take 50 mg by mouth daily.   Yes Historical Provider, MD  atorvastatin (LIPITOR) 10 MG tablet Take 10 mg by mouth daily.   Yes Historical Provider, MD  Enoxaparin Sodium (LOVENOX [place]) Inject 1 each into the skin as directed.    Yes Historical Provider, MD  furosemide (LASIX) 40 MG tablet Take 40 mg by mouth daily.   Yes Historical Provider, MD  guaiFENesin (MUCINEX) 600 MG 12 hr tablet Take 600 mg by mouth 2 (two) times daily. COPD.   Yes Historical Provider, MD  losartan (COZAAR) 100 MG tablet Take 100 mg by mouth daily.   Yes Historical Provider, MD  Multiple Vitamin (MULTIVITAMIN) tablet Take 1 tablet by mouth daily.     Yes Historical Provider, MD  SPIRIVA HANDIHALER 18 MCG inhalation capsule INHALE THE CONTENTS OF 1 CAPSULE BY HANDIHALER DAILY 02/14/11  Yes Noralee Space, MD  Tamsulosin HCl (FLOMAX) 0.4 MG CAPS Take 0.8 mg by mouth at bedtime.   Yes Historical Provider, MD  aspirin 81 MG tablet Take 81 mg by mouth daily.  Historical Provider, MD  Warfarin Sodium (COUMADIN PO) Take by mouth.    Historical Provider, MD    Social History: Currently lives a skilled nursing facility  reports that he quit smoking about 23 years ago. His smoking use included Cigarettes. He has a 88 pack-year smoking history. He has never used smokeless tobacco. He reports that he does not drink alcohol or use illicit drugs.  Family History  Problem Relation Age of Onset  . Dementia Mother   . Stroke Father   . Diabetes Brother   . Heart  disease Brother   . Heart disease Sister   . Heart disease Brother   . Obesity Brother   . Cancer Sister     back ??  . Alzheimer's disease Sister   . Colon cancer Neg Hx     Review of Systems:  Constitutional: Denies fever, chills, diaphoresis, appetite change and fatigue.  HEENT: Denies photophobia, eye pain, redness, hearing loss, ear pain, congestion, sore throat, rhinorrhea, sneezing, mouth sores, trouble swallowing, neck pain, neck stiffness and tinnitus.   Respiratory: Denies SOB, DOE, cough, chest tightness,  and wheezing.   Cardiovascular: Denies chest pain, palpitations and leg swelling.  Gastrointestinal: Denies nausea, vomiting, abdominal pain, diarrhea, constipation, blood in stool and abdominal distention.  Genitourinary: Denies dysuria, urgency, frequency, hematuria, flank pain and difficulty urinating.  Musculoskeletal: Denies myalgias, back pain, joint swelling, arthralgias and gait problem.  Skin: Denies pallor, rash and wound.  Neurological: Denies dizziness, seizures, syncope, weakness, light-headedness, numbness and headaches.  Hematological: Denies adenopathy. Easy bruising, personal or family bleeding history  Psychiatric/Behavioral: Denies suicidal ideation, mood changes, confusion, nervousness, sleep disturbance and agitation   Physical Exam: Blood pressure 93/58, pulse 85, temperature 97.7 F (36.5 C), temperature source Oral, resp. rate 18, SpO2 97.00%. Gen: Alert awake oriented to self and place, intermittently confused HEENT: Pupils equal reactive to light oral mucosa dry neck no JVD or lymphadenopathy CVS S1-S2 regular rate rhythm no murmurs rubs or gallops Lungs: Crackles at the left base Abdomen soft nontender with normal bowel sounds no organomegaly  Extremities no edema clubbing or cyanosis Neuro: Grossly strength is 4/5, no localizing signs, plantars downgoing  Labs on Admission:  Results for orders placed during the hospital encounter of  02/03/12 (from the past 48 hour(s))  URINALYSIS, ROUTINE W REFLEX MICROSCOPIC     Status: Abnormal   Collection Time   02/03/12  4:28 PM      Component Value Range Comment   Color, Urine YELLOW  YELLOW     APPearance CLEAR  CLEAR     Specific Gravity, Urine 1.016  1.005 - 1.030     pH 6.0  5.0 - 8.0     Glucose, UA NEGATIVE  NEGATIVE (mg/dL)    Hgb urine dipstick MODERATE (*) NEGATIVE     Bilirubin Urine NEGATIVE  NEGATIVE     Ketones, ur NEGATIVE  NEGATIVE (mg/dL)    Protein, ur NEGATIVE  NEGATIVE (mg/dL)    Urobilinogen, UA 0.2  0.0 - 1.0 (mg/dL)    Nitrite NEGATIVE  NEGATIVE     Leukocytes, UA NEGATIVE  NEGATIVE    URINE MICROSCOPIC-ADD ON     Status: Abnormal   Collection Time   02/03/12  4:28 PM      Component Value Range Comment   WBC, UA 0-2  <3 (WBC/hpf)    RBC / HPF 3-6  <3 (RBC/hpf)    Bacteria, UA FEW (*) RARE    CBC  Status: Abnormal   Collection Time   02/03/12  4:30 PM      Component Value Range Comment   WBC 10.1  4.0 - 10.5 (K/uL)    RBC 4.22  4.22 - 5.81 (MIL/uL)    Hemoglobin 13.3  13.0 - 17.0 (g/dL)    HCT 39.0  39.0 - 52.0 (%)    MCV 92.4  78.0 - 100.0 (fL)    MCH 31.5  26.0 - 34.0 (pg)    MCHC 34.1  30.0 - 36.0 (g/dL)    RDW 15.6 (*) 11.5 - 15.5 (%)    Platelets 174  150 - 400 (K/uL)   DIFFERENTIAL     Status: Abnormal   Collection Time   02/03/12  4:30 PM      Component Value Range Comment   Neutrophils Relative 89 (*) 43 - 77 (%)    Neutro Abs 9.1 (*) 1.7 - 7.7 (K/uL)    Lymphocytes Relative 5 (*) 12 - 46 (%)    Lymphs Abs 0.5 (*) 0.7 - 4.0 (K/uL)    Monocytes Relative 5  3 - 12 (%)    Monocytes Absolute 0.5  0.1 - 1.0 (K/uL)    Eosinophils Relative 0  0 - 5 (%)    Eosinophils Absolute 0.0  0.0 - 0.7 (K/uL)    Basophils Relative 0  0 - 1 (%)    Basophils Absolute 0.0  0.0 - 0.1 (K/uL)   COMPREHENSIVE METABOLIC PANEL     Status: Abnormal   Collection Time   02/03/12  4:30 PM      Component Value Range Comment   Sodium 129 (*) 135 - 145  (mEq/L)    Potassium 4.4  3.5 - 5.1 (mEq/L)    Chloride 94 (*) 96 - 112 (mEq/L)    CO2 24  19 - 32 (mEq/L)    Glucose, Bld 99  70 - 99 (mg/dL)    BUN 30 (*) 6 - 23 (mg/dL)    Creatinine, Ser 1.35  0.50 - 1.35 (mg/dL)    Calcium 9.4  8.4 - 10.5 (mg/dL)    Total Protein 6.5  6.0 - 8.3 (g/dL)    Albumin 2.9 (*) 3.5 - 5.2 (g/dL)    AST 28  0 - 37 (U/L)    ALT 25  0 - 53 (U/L)    Alkaline Phosphatase 208 (*) 39 - 117 (U/L)    Total Bilirubin 2.6 (*) 0.3 - 1.2 (mg/dL)    GFR calc non Af Amer 46 (*) >90 (mL/min)    GFR calc Af Amer 53 (*) >90 (mL/min)   APTT     Status: Normal   Collection Time   02/03/12  4:30 PM      Component Value Range Comment   aPTT 31  24 - 37 (seconds)   PROTIME-INR     Status: Abnormal   Collection Time   02/03/12  4:30 PM      Component Value Range Comment   Prothrombin Time 15.7 (*) 11.6 - 15.2 (seconds)    INR 1.22  0.00 - 1.49    LACTIC ACID, PLASMA     Status: Normal   Collection Time   02/03/12  4:30 PM      Component Value Range Comment   Lactic Acid, Venous 1.1  0.5 - 2.2 (mmol/L)   D-DIMER, QUANTITATIVE     Status: Abnormal   Collection Time   02/03/12  4:30 PM      Component Value Range Comment  D-Dimer, Quant 8.23 (*) 0.00 - 0.48 (ug/mL-FEU)   PRO B NATRIURETIC PEPTIDE     Status: Abnormal   Collection Time   02/03/12  4:30 PM      Component Value Range Comment   Pro B Natriuretic peptide (BNP) 4342.0 (*) 0 - 450 (pg/mL)   POCT I-STAT TROPONIN I     Status: Normal   Collection Time   02/03/12  4:48 PM      Component Value Range Comment   Troponin i, poc 0.00  0.00 - 0.08 (ng/mL)    Comment 3              Radiological Exams on Admission: Ct Abdomen Pelvis Wo Contrast  02/03/2012  *RADIOLOGY REPORT*  Clinical Data: Extreme weakness, recent AAA repair  CT ABDOMEN AND PELVIS WITHOUT CONTRAST  Technique:  Multidetector CT imaging of the abdomen and pelvis was performed following the standard protocol without intravenous contrast.  Comparison: CT  abdomen 10 /18/2011  Findings: There is bibasilar air space disease which is slightly increased compared to prior.  No pericardial fluid.  There is an aortic stent graft in the descending thoracic aorta. There  arebi- iliac stent grafts distally.  No focal hepatic lesion.  Gallbladder, pancreas, spleen, and adrenal glands are normal.  There is mild hydronephrosis of the right kidney.  Left kidney is normal.  The bladder is distended. Bladder distention and right hydronephrosis are new from prior.  There is no free fluid the pelvis.  Prostate gland demonstrate brachytherapy seeds inferiorly.  No pelvic lymphadenopathy. Review of  bone windows demonstrates no aggressive osseous lesions.  The IMPRESSION:  1.  New bladder distention and mild  right hydronephrosis. 2.  Thoracic stent graft without complication. 3.  Bibasilar air space disease.  Favor edema over pneumonia or aspiration pneumonitis.  Original Report Authenticated By: Suzy Bouchard, M.D.   Ct Head Wo Contrast  02/03/2012  *RADIOLOGY REPORT*  Clinical Data: Extremity weakness and tremors.  CT HEAD WITHOUT CONTRAST  Technique:  Contiguous axial images were obtained from the base of the skull through the vertex without contrast.  Comparison: CT head without contrast 01/23/2012.  Findings: Atrophy and extensive white matter changes are similar to the prior study.  There are remote lacunar infarcts of the basal ganglia, particularly in the right caudate head and thalamus.  No acute cortical infarct, hemorrhage, mass lesion is present.  The ventricles are proportionate to the degree of atrophy.  No significant extra-axial fluid collection is present.  The paranasal sinuses are clear.  Sclerosis of the left mastoid air cells is again noted.  The osseous skull is intact.  IMPRESSION:  1.  No acute intracranial abnormality or significant interval change. 2.  Stable atrophy and extensive white matter disease.  This likely reflects the sequelae of chronic  microvascular ischemia.  Original Report Authenticated By: Resa Miner. MATTERN, M.D.   Ct Angio Chest W/cm &/or Wo Cm  02/03/2012  *RADIOLOGY REPORT*  Clinical Data: Status post thoracic aneurysm repair.  Elevated D- dimer.  Hypotension.  CT ANGIOGRAPHY CHEST  Technique:  Multidetector CT imaging of the chest using the standard protocol during bolus administration of intravenous contrast. Multiplanar reconstructed images including MIPs were obtained and reviewed to evaluate the vascular anatomy.  Contrast: 166m OMNIPAQUE IOHEXOL 300 MG/ML  SOLN  Comparison: CT chest 08/31/2011.  Plain film chest 02/03/2012.  Findings: No pulmonary embolus is identified.  Stent graft in the descending thoracic aorta is seen.  There is cardiomegaly.  No pleural or pericardial effusion.  No axillary, hilar or mediastinal lymphadenopathy.  There is some fluid in the esophagus compatible with poor motility and/or reflux.  Lungs demonstrate extensive centrilobular emphysema.  The patient has airspace disease posterior of the lung bases, worse on the left.  Incidentally imaged upper abdomen demonstrates low attenuation lesion the right kidney most consistent with a cyst which is partially visualized.  There is no focal bony abnormality.  IMPRESSION:  1.  Negative for pulmonary embolus. 2.  Left worse than right basilar airspace disease worrisome for pneumonia or less likely aspiration, particularly on the left. 3.  Status post thoracic aortic aneurysm repair with a stent graft in place. 4.  Cardiomegaly. 5.  Fluid in the esophagus compatible with fluid tali reflux. 6.  Emphysema.  Original Report Authenticated By: Arvid Right. Luther Parody, M.D.   Dg Chest Port 1 View  02/03/2012  *RADIOLOGY REPORT*  Clinical Data: Atrial fibrillation  PORTABLE CHEST - 1 VIEW  Comparison: Chest radiograph 01/16/2012  Findings: Thoracic stent graft noted.  Stable enlarged heart silhouette.  There is bilateral air space disease worse in the left lower  lobe.  Upper lungs are relatively clear.  IMPRESSION: Asymmetric bibasilar air space disease representing edema or infection.  Original Report Authenticated By: Suzy Bouchard, M.D.    Assessment/Plan 1. Healthcare-associated pneumonia Blood cultures x2 Start IV vancomycin, cefepime, Levaquin De-escalate antibiotics in 48 hours depending on clinical response,  Check sputum cultures, urine Legionella and pneumococcal antigen Check swallow evaluation 2. Metabolic encephalopathy: Secondary to pneumonia and dehydration, family has noticed a progressive cognitive decline for one month, so there could be an element of early dementia too 3. Urinary retention: Secondary to lumbar spinal hematoma and surgery, resume Flomax, currently has a Foley catheter placed in emergency room, once clinically improved and cognitively clearer, could DC catheter and start voiding trial, if unsuccessful will need to revert back to intermittent catheterizations. 4. Dehydration/hyponatremia: Clinically appears dry, although BNP is elevated I suspect this is secondary to poor by mouth intake, and being on daily Lasix Gentle IV fluids, repeat B. met in a.m., hold diuretic for now 5. Chronic atrial fibrillation: Rate controlled, in sinus rhythm, continue atenolol Last discharge note from inpatient rehabilitation, plan was to start anticoagulation within 1 month of spinal surgery; which technically would be tomorrow, will need to discuss with neurosurgery Dr.jenkins if Coumadin can be started with Lovenox bridge. 6. status post recent laminectomy and epidural hematoma evacuation 7. recent endovascular thoraco-abdominal aortic aneurysm repair 8. Deconditioning 9. COPD: stable Code status: Full   Time Spent on Admission: 46mn  Jacquelin Krajewski [facility] Pager: [phone]/07/2012, 8:06 PM

## 2012-02-03 NOTE — ED Notes (Signed)
Error clicked of page MD RN aware

## 2012-02-03 NOTE — Progress Notes (Signed)
Ok to hold po meds for tonight per M. Lynch. Pt needs swallow eval.

## 2012-02-03 NOTE — ED Notes (Signed)
EMS called to [facility].  Found patient laying bed.  Patient complaining of weakness He is responsive to vocal commands but does not open his eyes.  Patient states he has  Pain on his right side.  Family states patient has possibly fallen recently

## 2012-02-04 DIAGNOSIS — R5383 Other fatigue: Secondary | ICD-10-CM

## 2012-02-04 DIAGNOSIS — R5381 Other malaise: Secondary | ICD-10-CM

## 2012-02-04 DIAGNOSIS — D696 Thrombocytopenia, unspecified: Secondary | ICD-10-CM

## 2012-02-04 DIAGNOSIS — R339 Retention of urine, unspecified: Secondary | ICD-10-CM

## 2012-02-04 DIAGNOSIS — E860 Dehydration: Secondary | ICD-10-CM

## 2012-02-04 LAB — CBC
HCT: 34.9 % — ABNORMAL LOW (ref 39.0–52.0)
Hemoglobin: 11.8 g/dL — ABNORMAL LOW (ref 13.0–17.0)
MCH: 31.3 pg (ref 26.0–34.0)
MCHC: 33.8 g/dL (ref 30.0–36.0)
MCV: 92.6 fL (ref 78.0–100.0)
Platelets: 181 10*3/uL (ref 150–400)
RBC: 3.77 MIL/uL — ABNORMAL LOW (ref 4.22–5.81)
RDW: 15.3 % (ref 11.5–15.5)
WBC: 9.4 10*3/uL (ref 4.0–10.5)

## 2012-02-04 LAB — COMPREHENSIVE METABOLIC PANEL
ALT: 18 U/L (ref 0–53)
AST: 20 U/L (ref 0–37)
Albumin: 2.3 g/dL — ABNORMAL LOW (ref 3.5–5.2)
Alkaline Phosphatase: 161 U/L — ABNORMAL HIGH (ref 39–117)
BUN: 25 mg/dL — ABNORMAL HIGH (ref 6–23)
CO2: 24 mEq/L (ref 19–32)
Calcium: 8.4 mg/dL (ref 8.4–10.5)
Chloride: 99 mEq/L (ref 96–112)
Creatinine, Ser: 1.19 mg/dL (ref 0.50–1.35)
GFR calc Af Amer: 62 mL/min — ABNORMAL LOW (ref >90)
GFR calc non Af Amer: 53 mL/min — ABNORMAL LOW (ref >90)
Glucose, Bld: 81 mg/dL (ref 70–99)
Potassium: 4.2 mEq/L (ref 3.5–5.1)
Sodium: 132 mEq/L — ABNORMAL LOW (ref 135–145)
Total Bilirubin: 2.3 mg/dL — ABNORMAL HIGH (ref 0.3–1.2)
Total Protein: 5.3 g/dL — ABNORMAL LOW (ref 6.0–8.3)

## 2012-02-04 LAB — MRSA PCR SCREENING: MRSA by PCR: NEGATIVE

## 2012-02-04 LAB — STREP PNEUMONIAE URINARY ANTIGEN: Strep Pneumo Urinary Antigen: NEGATIVE

## 2012-02-04 MED ORDER — LEVOFLOXACIN IN D5W 750 MG/150ML IV SOLN
750.0000 mg | INTRAVENOUS | Status: AC
  Administered 2012-02-04 – 2012-02-05 (×2): 750 mg via INTRAVENOUS
  Filled 2012-02-04 (×2): qty 150

## 2012-02-04 MED ORDER — SODIUM CHLORIDE 0.9 % IV SOLN
750.0000 mg | Freq: Two times a day (BID) | INTRAVENOUS | Status: DC
  Administered 2012-02-04 – 2012-02-07 (×7): 750 mg via INTRAVENOUS
  Filled 2012-02-04 (×7): qty 750

## 2012-02-04 MED ORDER — DEXTROSE 5 % IV SOLN
1.0000 g | Freq: Three times a day (TID) | INTRAVENOUS | Status: DC
  Administered 2012-02-04 – 2012-02-07 (×8): 1 g via INTRAVENOUS
  Filled 2012-02-04 (×11): qty 1

## 2012-02-04 MED ORDER — CHLORHEXIDINE GLUCONATE 0.12 % MT SOLN
15.0000 mL | Freq: Two times a day (BID) | OROMUCOSAL | Status: DC
  Administered 2012-02-04 – 2012-02-09 (×10): 15 mL via OROMUCOSAL
  Filled 2012-02-04 (×15): qty 15

## 2012-02-04 MED ORDER — BIOTENE DRY MOUTH MT LIQD
15.0000 mL | Freq: Two times a day (BID) | OROMUCOSAL | Status: DC
  Administered 2012-02-04: 15 mL via OROMUCOSAL

## 2012-02-04 MED ORDER — BIOTENE DRY MOUTH MT LIQD
15.0000 mL | Freq: Two times a day (BID) | OROMUCOSAL | Status: DC
  Administered 2012-02-04 – 2012-02-09 (×9): 15 mL via OROMUCOSAL

## 2012-02-04 NOTE — Progress Notes (Signed)
Ate 100% lunch, no cough, pocketing noted. Genia Harold

## 2012-02-04 NOTE — Progress Notes (Signed)
Subjective: Doing better. More alert and awake, still feels weak. Case discussed in detail with daughter and 2 sons. It appears that the main contact and power of attorney is his son Kierre Hintz.  Objective: Vital signs in last 24 hours: Temp:  [97.6 F (36.4 C)-98.3 F (36.8 C)] 97.9 F (36.6 C) (05/12 0433) Pulse Rate:  [53-97] 97  (05/12 0433) Resp:  [16-30] 18  (05/12 0433) BP: (90-160)/(37-126) 103/70 mmHg (05/12 1100) SpO2:  [91 %-98 %] 94 % (05/12 0926) FiO2 (%):  [2 %] 2 % (05/12 0433) Weight:  [82.4 kg (181 lb 10.5 oz)-83.3 kg (183 lb 10.3 oz)] 82.4 kg (181 lb 10.5 oz) (05/12 0433) Weight change:  Last BM Date: 02/03/12 (per pt /but he's confused)  Intake/Output from previous day: 05/11 0701 - 05/12 0700 In: 447.5 [I.V.:447.5] Out: 1700 [Urine:1700] Total I/O In: 1005 [P.O.:480; I.V.:525] Out: -    Physical Exam: General: Alert, awake, oriented x3, in no acute distress. HEENT: No bruits, no goiter. Heart: Regular rate and rhythm, without murmurs, rubs, gallops. Lungs: Bilateral crackles. Abdomen: Soft, nontender, nondistended, positive bowel sounds. Extremities: No clubbing cyanosis or edema with positive pedal pulses. Neuro: Grossly intact, nonfocal.    Lab Results: Basic Metabolic Panel:  Basename 02/04/12 0538 02/03/12 1630  NA 132* 129*  K 4.2 4.4  CL 99 94*  CO2 24 24  GLUCOSE 81 99  BUN 25* 30*  CREATININE 1.19 1.35  CALCIUM 8.4 9.4  MG -- --  PHOS -- --   Liver Function Tests:  [facility] 02/04/12 0538 02/03/12 1630  AST 20 28  ALT 18 25  ALKPHOS 161* 208*  BILITOT 2.3* 2.6*  PROT 5.3* 6.5  ALBUMIN 2.3* 2.9*   CBC:  Basename 02/04/12 0538 02/03/12 1630  WBC 9.4 10.1  NEUTROABS -- 9.1*  HGB 11.8* 13.3  HCT 34.9* 39.0  MCV 92.6 92.4  PLT 181 174   BNP:  Basename 02/03/12 1630  PROBNP 4342.0*   D-Dimer:  Basename 02/03/12 1630  DDIMER 8.23*   Coagulation:  Basename 02/03/12 1630  LABPROT 15.7*  INR 1.22    Urinalysis:  Basename 02/03/12 1628  COLORURINE YELLOW  LABSPEC 1.016  PHURINE 6.0  GLUCOSEU NEGATIVE  HGBUR MODERATE*  BILIRUBINUR NEGATIVE  KETONESUR NEGATIVE  PROTEINUR NEGATIVE  UROBILINOGEN 0.2  NITRITE NEGATIVE  LEUKOCYTESUR NEGATIVE    Recent Results (from the past 240 hour(s))  URINE CULTURE     Status: Normal   Collection Time   01/25/12  1:58 PM      Component Value Range Status Comment   Specimen Description URINE, CATHETERIZED   Final    Special Requests NONE   Final    Culture  Setup Time [id number]   Final    Colony Count NO GROWTH   Final    Culture NO GROWTH   Final    Report Status 01/26/2012 FINAL   Final   MRSA PCR SCREENING     Status: Normal   Collection Time   02/03/12  9:21 PM      Component Value Range Status Comment   MRSA by PCR NEGATIVE  NEGATIVE  Final     Studies/Results: Ct Abdomen Pelvis Wo Contrast  02/03/2012  *RADIOLOGY REPORT*  Clinical Data: Extreme weakness, recent AAA repair  CT ABDOMEN AND PELVIS WITHOUT CONTRAST  Technique:  Multidetector CT imaging of the abdomen and pelvis was performed following the standard protocol without intravenous contrast.  Comparison: CT abdomen 10 /18/2011  Findings: There is bibasilar  air space disease which is slightly increased compared to prior.  No pericardial fluid.  There is an aortic stent graft in the descending thoracic aorta. There  arebi- iliac stent grafts distally.  No focal hepatic lesion.  Gallbladder, pancreas, spleen, and adrenal glands are normal.  There is mild hydronephrosis of the right kidney.  Left kidney is normal.  The bladder is distended. Bladder distention and right hydronephrosis are new from prior.  There is no free fluid the pelvis.  Prostate gland demonstrate brachytherapy seeds inferiorly.  No pelvic lymphadenopathy. Review of  bone windows demonstrates no aggressive osseous lesions.  The IMPRESSION:  1.  New bladder distention and mild  right hydronephrosis. 2.  Thoracic  stent graft without complication. 3.  Bibasilar air space disease.  Favor edema over pneumonia or aspiration pneumonitis.  Original Report Authenticated By: Genevive Bi, M.D.   Ct Head Wo Contrast  02/03/2012  *RADIOLOGY REPORT*  Clinical Data: Extremity weakness and tremors.  CT HEAD WITHOUT CONTRAST  Technique:  Contiguous axial images were obtained from the base of the skull through the vertex without contrast.  Comparison: CT head without contrast 01/23/2012.  Findings: Atrophy and extensive white matter changes are similar to the prior study.  There are remote lacunar infarcts of the basal ganglia, particularly in the right caudate head and thalamus.  No acute cortical infarct, hemorrhage, mass lesion is present.  The ventricles are proportionate to the degree of atrophy.  No significant extra-axial fluid collection is present.  The paranasal sinuses are clear.  Sclerosis of the left mastoid air cells is again noted.  The osseous skull is intact.  IMPRESSION:  1.  No acute intracranial abnormality or significant interval change. 2.  Stable atrophy and extensive white matter disease.  This likely reflects the sequelae of chronic microvascular ischemia.  Original Report Authenticated By: Jamesetta Orleans. MATTERN, M.D.   Ct Angio Chest W/cm &/or Wo Cm  02/03/2012  *RADIOLOGY REPORT*  Clinical Data: Status post thoracic aneurysm repair.  Elevated D- dimer.  Hypotension.  CT ANGIOGRAPHY CHEST  Technique:  Multidetector CT imaging of the chest using the standard protocol during bolus administration of intravenous contrast. Multiplanar reconstructed images including MIPs were obtained and reviewed to evaluate the vascular anatomy.  Contrast: OMNIPAQUE IOHEXOL 300 MG/ML  SOLN  Comparison: CT chest 08/31/2011.  Plain film chest 02/03/2012.  Findings: No pulmonary embolus is identified.  Stent graft in the descending thoracic aorta is seen.  There is cardiomegaly.  No pleural or pericardial effusion.  No  axillary, hilar or mediastinal lymphadenopathy.  There is some fluid in the esophagus compatible with poor motility and/or reflux.  Lungs demonstrate extensive centrilobular emphysema.  The patient has airspace disease posterior of the lung bases, worse on the left.  Incidentally imaged upper abdomen demonstrates low attenuation lesion the right kidney most consistent with a cyst which is partially visualized.  There is no focal bony abnormality.  IMPRESSION:  1.  Negative for pulmonary embolus. 2.  Left worse than right basilar airspace disease worrisome for pneumonia or less likely aspiration, particularly on the left. 3.  Status post thoracic aortic aneurysm repair with a stent graft in place. 4.  Cardiomegaly. 5.  Fluid in the esophagus compatible with fluid tali reflux. 6.  Emphysema.  Original Report Authenticated By: Bernadene Bell. Maricela Curet, M.D.   Dg Chest Port 1 View  02/03/2012  *RADIOLOGY REPORT*  Clinical Data: Atrial fibrillation  PORTABLE CHEST - 1 VIEW  Comparison: Chest radiograph 01/16/2012  Findings: Thoracic stent graft noted.  Stable enlarged heart silhouette.  There is bilateral air space disease worse in the left lower lobe.  Upper lungs are relatively clear.  IMPRESSION: Asymmetric bibasilar air space disease representing edema or infection.  Original Report Authenticated By: Genevive Bi, M.D.    Medications: Scheduled Meds:   . sodium chloride   Intravenous Once  . antiseptic oral rinse  15 mL Mouth Rinse q12n4p  . antiseptic oral rinse  15 mL Mouth Rinse BID  . aspirin EC  81 mg Oral Daily  . atenolol  50 mg Oral Daily  . atorvastatin  10 mg Oral q1800  . ceFEPime (MAXIPIME) IV  1 g Intravenous Q8H  . chlorhexidine  15 mL Mouth Rinse BID  . enoxaparin (LOVENOX) injection  40 mg Subcutaneous Q24H  . guaiFENesin  600 mg Oral BID  . levofloxacin (LEVAQUIN) IV  750 mg Intravenous Q24H  . piperacillin-tazobactam (ZOSYN)  IV  3.375 g Intravenous Once  . sodium chloride  500 mL  Intravenous STAT  . Tamsulosin HCl  0.8 mg Oral QHS  . tiotropium  18 mcg Inhalation Daily  . vancomycin  750 mg Intravenous Q12H  . DISCONTD: ceFEPime (MAXIPIME) IV  1 g Intravenous Q8H  . DISCONTD: ceFEPime (MAXIPIME) IV  1 g Intravenous Q12H  . DISCONTD: enoxaparin  30 mg Subcutaneous Q24H  . DISCONTD: levofloxacin (LEVAQUIN) IV  750 mg Intravenous Q24H  . DISCONTD: levofloxacin (LEVAQUIN) IV  750 mg Intravenous Q48H  . DISCONTD: vancomycin  1,250 mg Intravenous Q24H  . DISCONTD: vancomycin  1,250 mg Intravenous Q24H  . DISCONTD: vancomycin  1,000 mg Intravenous Once   Continuous Infusions:   . sodium chloride 75 mL/hr at 02/04/12 1233  . DISCONTD: sodium chloride     PRN Meds:.acetaminophen, acetaminophen, iohexol, ondansetron (ZOFRAN) IV, ondansetron  Assessment/Plan:  Active Problems:  Atrial fibrillation  Healthcare-associated pneumonia  Urinary retention  Hyponatremia   #1 healthcare associated pneumonia: Agree with current antibiotic therapy of vancomycin, cefepime, Levaquin. Continue to monitor fever and white blood cell curve.  #2 urinary retention: This seems to be a problem with him ever since he had his laminectomy surgery for his epidural hematoma. Will leave Foley catheter in today. Will take out tomorrow morning and start voiding trials. If he continues to have urinary retention then will replace Foley catheter and have him seen by urology either inpatient or outpatient.  #3 hyponatremia: Sodium is up to 132 from 129. I suspect this was related to some mild intravascular volume depletion. Continue normal saline.  #4 generalized weakness: We'll get PT and OT assessments. Of note patient [facility] and was sent to SNF. Family was not very happy with care that he received at skilled nursing facility and is wondering about what options they have to take him home. Will ask case management and social work to look into this  in detail tomorrow morning.   LOS: 1 day   [facility] Pager: [phone] 02/04/2012, 1:13 PM

## 2012-02-04 NOTE — Progress Notes (Signed)
Pt fully alert this am and constantly asking for food.  ST states it will be late afternoon or tomorrow before pt can be seen.  MD aware and bedside swallow done by ED staff, which pt did well with, diet ordered, and ST will still do their eval when possible.  Will cont to monitor using aspiration precautions. Genia Harold

## 2012-02-05 DIAGNOSIS — R339 Retention of urine, unspecified: Secondary | ICD-10-CM

## 2012-02-05 DIAGNOSIS — R5383 Other fatigue: Secondary | ICD-10-CM

## 2012-02-05 DIAGNOSIS — R5381 Other malaise: Secondary | ICD-10-CM

## 2012-02-05 DIAGNOSIS — E860 Dehydration: Secondary | ICD-10-CM

## 2012-02-05 DIAGNOSIS — D696 Thrombocytopenia, unspecified: Secondary | ICD-10-CM

## 2012-02-05 LAB — CBC
HCT: 35.9 % — ABNORMAL LOW (ref 39.0–52.0)
Hemoglobin: 12.1 g/dL — ABNORMAL LOW (ref 13.0–17.0)
MCH: 31.3 pg (ref 26.0–34.0)
MCHC: 33.7 g/dL (ref 30.0–36.0)
MCV: 92.8 fL (ref 78.0–100.0)
Platelets: 197 10*3/uL (ref 150–400)
RBC: 3.87 MIL/uL — ABNORMAL LOW (ref 4.22–5.81)
RDW: 15.3 % (ref 11.5–15.5)
WBC: 6.2 10*3/uL (ref 4.0–10.5)

## 2012-02-05 LAB — BASIC METABOLIC PANEL
BUN: 24 mg/dL — ABNORMAL HIGH (ref 6–23)
CO2: 22 mEq/L (ref 19–32)
Calcium: 8.3 mg/dL — ABNORMAL LOW (ref 8.4–10.5)
Chloride: 103 mEq/L (ref 96–112)
Creatinine, Ser: 1.07 mg/dL (ref 0.50–1.35)
GFR calc Af Amer: 70 mL/min — ABNORMAL LOW (ref >90)
GFR calc non Af Amer: 61 mL/min — ABNORMAL LOW (ref >90)
Glucose, Bld: 82 mg/dL (ref 70–99)
Potassium: 4.1 mEq/L (ref 3.5–5.1)
Sodium: 133 mEq/L — ABNORMAL LOW (ref 135–145)

## 2012-02-05 LAB — LEGIONELLA ANTIGEN, URINE: Legionella Antigen, Urine: NEGATIVE

## 2012-02-05 LAB — PROTIME-INR
INR: 1.21 (ref 0.00–1.49)
Prothrombin Time: 15.6 seconds — ABNORMAL HIGH (ref 11.6–15.2)

## 2012-02-05 MED ORDER — WARFARIN - PHARMACIST DOSING INPATIENT: Freq: Every day | Status: DC

## 2012-02-05 MED ORDER — WARFARIN SODIUM 2.5 MG PO TABS
2.5000 mg | ORAL_TABLET | Freq: Once | ORAL | Status: DC
  Filled 2012-02-05: qty 1

## 2012-02-05 MED ORDER — WARFARIN SODIUM 5 MG PO TABS
5.0000 mg | ORAL_TABLET | Freq: Once | ORAL | Status: AC
  Administered 2012-02-05: 5 mg via ORAL
  Filled 2012-02-05: qty 1

## 2012-02-05 NOTE — Evaluation (Signed)
Occupational Therapy Evaluation Patient Details Name: Ryan Hess MRN: [id number] DOB: 09/12/1924 Today's Date: 02/05/2012 Time: 4782-9562 OT Time Calculation (min): 22 min  OT Assessment / Plan / Recommendation Clinical Impression  Pt presents with diagnosis of Pna. S/p 4 level laminectomyand L2-L3 epidural hematoma evacuation on 01/05/12. Recent d/c from [facility] on 01/29/12 to SNF. Feel pt could benefit from ST rehab at SNF to maximize independence with all self care tasks but family does not want pt to return to SNF. Pt will require 24/7 assist  for safety if he does discharge home and son is aware of this recommendation.      OT Assessment  Patient needs continued OT Services    Follow Up Recommendations  Skilled nursing facility;however son doesn't want pt to return to SNF. If home, will need 24/7 assist and Home health OT and would benefit from a [facility] aide.  Son aware of 24/7 assist recommended.    Barriers to Discharge      Equipment Recommendations  Rolling walker with 5" wheels;Other (comment) (son is interested in possibly a tub transfer bench)    Recommendations for Other Services    Frequency  Min 2X/week    Precautions / Restrictions Precautions Precautions: Fall Restrictions Weight Bearing Restrictions: No        ADL  Eating/Feeding: Not assessed Grooming: Simulated;Wash/dry hands;Set up Where Assessed - Grooming: Supported sitting Upper Body Bathing: Simulated;Chest;Right arm;Left arm;Abdomen;Supervision/safety;Set up Where Assessed - Upper Body Bathing: Sitting, chair;Unsupported Lower Body Bathing: Simulated;Minimal assistance Where Assessed - Lower Body Bathing: Sit to stand from chair Upper Body Dressing: Simulated;Minimal assistance Where Assessed - Upper Body Dressing: Sitting, chair;Unsupported Lower Body Dressing: Simulated;Minimal assistance Where Assessed - Lower Body Dressing: Sit to stand from chair Toilet Transfer: Simulated;Minimal  assistance Toilet Transfer Method: Ambulating Toileting - Clothing Manipulation: Simulated;Minimal assistance Where Assessed - Glass blower/designer Manipulation: Standing Toileting - Hygiene: Simulated;Minimal assistance Where Assessed - Toileting Hygiene: Standing Tub/Shower Transfer: Not assessed Tub/Shower Transfer Method: Not assessed Equipment Used: Rolling walker ADL Comments: Son present for eval.     OT Diagnosis: Generalized weakness  OT Problem List: Decreased strength;Decreased knowledge of use of DME or AE;Impaired balance (sitting and/or standing) OT Treatment Interventions: Self-care/ADL training;Therapeutic activities;DME and/or AE instruction;Patient/family education   OT Goals Acute Rehab OT Goals OT Goal Formulation: With patient/family Time For Goal Achievement: 02/19/12 Potential to Achieve Goals: Good ADL Goals Pt Will Perform Grooming: with supervision;Standing at sink ADL Goal: Grooming - Progress: Goal set today Pt Will Perform Lower Body Bathing: with supervision;Sit to stand from chair;Sit to stand from bed ADL Goal: Lower Body Bathing - Progress: Goal set today Pt Will Perform Lower Body Dressing: with supervision;Sit to stand from chair;Sit to stand from bed ADL Goal: Lower Body Dressing - Progress: Goal set today Pt Will Perform Tub/Shower Transfer: with min assist;with DME;Transfer tub bench;Other (comment) (if pt desires tubbench) Additional ADL Goal #1: pt will peform all aspects of toileting with supervison and RW and handicap toilet with grab bar versus 3in1 ADL Goal: Additional Goal #1 - Progress: Goal set today  Visit Information  Last OT Received On: 02/05/12 Assistance Needed: +1 PT/OT Co-Evaluation/Treatment: Yes    Subjective Data  Subjective: pt smiles at PT/OT Patient Stated Goal: none stated but agreeable to PT/OT   Prior Functioning  Home Living Lives With: Spouse (spouse unable to assist) Available Help at Discharge: Family;Other  (Comment) (son reports they will have to work on 24/7 assist) Type of Home: House Home Access:  Stairs to enter [employer] of Steps: 5 Home Layout: One level;Other (Comment) (one step down into den with a rail) Bathroom Shower/Tub: Tub/shower unit;Other (comment) (grab bar) Bathroom Toilet: Handicapped height (grab bar) Home Adaptive Equipment: None Prior Function Level of Independence: Needs assistance (was independent with no device prior to surgery in april) Needs Assistance: Bathing;Dressing;Toileting Bath:  (per son was receiving help with ADL. Not clear how much) Communication Communication: No difficulties    Cognition  Overall Cognitive Status: Impaired Area of Impairment: Memory;Following commands;Safety/judgement;Awareness of errors;Problem solving;Awareness of deficits Arousal/Alertness: Awake/alert Orientation Level: Disoriented to;Time;Situation;Place Behavior During Session: [facility] for tasks performed Memory: Decreased recall of precautions Following Commands: Follows multi-step commands inconsistently Safety/Judgement: Decreased awareness of safety precautions;Decreased safety judgement for tasks assessed;Decreased awareness of need for assistance Awareness of Errors: Assistance required to identify errors made    Extremity/Trunk Assessment Right Upper Extremity Assessment RUE ROM/Strength/Tone: Within functional levels (fragile skin with skin tear noted on R UE) Left Upper Extremity Assessment LUE ROM/Strength/Tone: Within functional levels Right Lower Extremity Assessment RLE ROM/Strength/Tone: WFL for tasks assessed RLE Coordination: WFL - gross motor Left Lower Extremity Assessment LLE ROM/Strength/Tone: WFL for tasks assessed LLE Coordination: WFL - gross motor Trunk Assessment Trunk Assessment: Normal   Mobility Bed Mobility Bed Mobility: Not assessed Transfers Sit to Stand: 4: Min guard Stand to Sit: 4: Min guard Details for Transfer  Assistance: VCs safety, hand placement. Pt somewhat impulsive and eaily distracted.   Exercise    Balance Dynamic Standing Balance Dynamic Standing - Level of Assistance: 4: Min assist  End of Session OT - End of Session Equipment Utilized During Treatment: Gait belt Activity Tolerance: Patient tolerated treatment well Patient left: in chair;with call bell/phone within reach   Lennox Laity [phone] 02/05/2012, 12:03 PM

## 2012-02-05 NOTE — Evaluation (Signed)
Clinical/Bedside Swallow Evaluation Patient Details  Name: Ryan Hess MRN: [id number] Date of Birth: 1924/06/21  Today's Date: 02/05/2012 Time: 0920-0953 SLP Time Calculation (min): 33 min  Past Medical History:  Past Medical History  Diagnosis Date  . COPD (chronic obstructive pulmonary disease)   . Pneumonia, organism unspecified   . Pulmonary nodule   . HTN (hypertension)   . CHF (congestive heart failure)   . Atrial fibrillation   . Drug therapy   . PVC (premature ventricular contraction)   . PVD (peripheral vascular disease)   . Thoracic aortic aneurysm   . Hypercholesteremia   . Diverticulosis of colon   . Colon polyps   . Prostate cancer   . DJD (degenerative joint disease)   . Actinic skin damage   . Hyperlipidemia   . Internal hemorrhoids    Past Surgical History:  Past Surgical History  Procedure Date  . Total knee arthroplasty     bilateral  . Abdominal aortic aneurysm repair 1998  . Appendectomy   . Inguinal hernia repair     left  . Joint replacement    HPI:  78 yo male adm to [facility] with complex past medical history significant for recent endovascular repair of thoracoabdominal aneurysm on 4/8 at [facility], complicated by a lumbar epidural hematoma with paraparesis, subsequently underwent laminectomy and evacuation of the epidural hematoma on 4/12 again at [facility], was then discharged to Inpatient rehabilitation at [facility], and then discharged from there to SNF 5days ago.  Swallow evaluation was ordered due to CT Abd 5/11 showing bilateral airspace disease, favor edema over pna or asp pneumonitis.  CT abdomen showed fluid in esophagus c/w fluid tali reflux..     Assessment / Plan / Recommendation Clinical Impression  Pt without focal CN deficits, suspect overall functional oropharyngeal swallow and primary esophageal issues given known h/o motility disorder per esophagram Oct 2011 and fluid observed in esophagus on CT Chest 02/03/12.  Swallow  appeared timely with clear voice throughout-delayed cough and throat clear after thin observed.  SLP educated pt and son to general asp precautions, multiple risk factors for pnas associated with asp and reflux/motility compensatory strategies.  Pt acknowledges problems with reflux recently causing a sour taste in mouth-but has not reported this to MD.  Pt DENIES problems swallowing (even during 2011 when esophagram completed) except for recent reflux and xerostomia.  No further SLP indicated.  Thanks for the order.      Aspiration Risk  Mild    Diet Recommendation Regular;Thin liquid   Other  Recommendations     Follow Up Recommendations  None    Frequency and Duration   n/a     Pertinent Vitals/Pain Afebrile, crackles    SLP Swallow Goals  n/a   Swallow Study Prior Functional Status       General HPI: 78 yo male adm to [facility] with complex past medical history significant for recent endovascular repair of thoracoabdominal aneurysm on 4/8 at [facility], complicated by a lumbar epidural hematoma with paraparesis, subsequently underwent laminectomy and evacuation of the epidural hematoma on 4/12 again at [facility], was then discharged to Inpatient rehabilitation at [facility], and then discharged from there to SNF 5days ago.  Swallow evaluation was ordered due to CT Abd 5/11 showing bilateral airspace disease, favor edema over pna or asp pneumonitis.  CT abdomen showed fluid in esophagus c/w fluid tali reflux..   Type of Study: Bedside swallow evaluation Previous Swallow Assessment: esophagram oct 2011  nonspecific esophageal dysmotility disorder Diet Prior to this Study: Regular;Thin liquids Temperature Spikes Noted: No Respiratory Status: Room air Behavior/Cognition: Alert;Cooperative Oral Cavity - Dentition: Adequate natural dentition Vision: Functional for self-feeding Patient Positioning: Upright in chair Baseline Vocal Quality: Clear Volitional Cough: Strong Volitional Swallow:  Able to elicit    Oral/Motor/Sensory Function Overall Oral Motor/Sensory Function: Appears within functional limits for tasks assessed   Ice Chips Ice chips: Not tested   Thin Liquid Thin Liquid: Impaired Pharyngeal  Phase Impairments: Throat Clearing - Delayed;Cough - Delayed Other Comments: water via straw, inconsistent cough x1/throat clear x1 of approx 7 boluses    Nectar Thick Nectar Thick Liquid: Not tested   Honey Thick Honey Thick Liquid: Not tested   Puree Puree: Within functional limits Presentation: Self Fed;Spoon Other Comments: applesauce x 2 boluses   Solid Presentation: Self Fed Other Comments: graham cracker x1    Donavan Burnet, MS [facility] SLP [phone]

## 2012-02-05 NOTE — Evaluation (Signed)
Physical Therapy Evaluation Patient Details Name: Ryan Hess MRN: [id number] DOB: 11-15-1923 Today's Date: 02/05/2012 Time: 2440-1027 PT Time Calculation (min): 22 min  PT Assessment / Plan / Recommendation Clinical Impression  Pt presents with diagnosis of Pna. S/p 4 level laminectomyand L2-L3 epidural hematoma evacuation on 01/05/12. Recent d/c from [facility] on 01/29/12 to SNF. Feel pt could benefit from ST rehab at SNF to maximize independence and safety with mobility but family does not want pt to return to SNF. Explained to son Ryan Hess) that pt will require 24 hour supervision/assist for safety and that pt is high fall risk. Also informed son of frequency of therapy in acute setting and that we will TRY to see as much as possible, if able.     PT Assessment  Patient needs continued PT services    Follow Up Recommendations  Skilled nursing facility;Supervision/Assistance - 24 hour( if d/c is to home). Family does not want SNF. (If 24 hour not available, pt will need SNF).    Barriers to Discharge Decreased caregiver support      lEquipment Recommendations  Rolling walker with 5" wheels (if home)   Recommendations for Other Services OT consult (already on board)   Frequency Min 3X/week    Precautions / Restrictions Precautions Precautions: Fall Restrictions Weight Bearing Restrictions: No   Pertinent Vitals/Pain       Mobility  Bed Mobility Bed Mobility: Not assessed Transfers Transfers: Sit to Stand;Stand to Sit Sit to Stand: 4: Min guard Stand to Sit: 4: Min guard Details for Transfer Assistance: VCs safety, hand placement. Pt somewhat impulsive and eaily distracted. Ambulation/Gait Ambulation/Gait Assistance: 4: Min assist Ambulation Distance (Feet): 200 Feet Assistive device: Rolling walker Ambulation/Gait Assistance Details: Assist to stabilize throughout ambulation. Pace somewhat quick and uncontrolled. Cues for pt to increase width of BOS-feet touching or at times  tandem while ambulating. Gait Pattern: Trunk flexed;Narrow base of support;Scissoring    Exercises     PT Diagnosis: Difficulty walking;Abnormality of gait;Generalized weakness;Altered mental status  PT Problem List:   PT Treatment Interventions: DME instruction;Gait training;Stair training;Functional mobility training;Therapeutic activities;Therapeutic exercise;Balance training;Patient/family education   PT Goals Acute Rehab PT Goals PT Goal Formulation: With patient/family Time For Goal Achievement: 02/12/12 Potential to Achieve Goals: Good Pt will go Supine/Side to Sit: with supervision PT Goal: Supine/Side to Sit - Progress: Goal set today Pt will go Sit to Supine/Side: with supervision PT Goal: Sit to Supine/Side - Progress: Goal set today Pt will go Sit to Stand: with supervision PT Goal: Sit to Stand - Progress: Goal set today Pt will Transfer Bed to Chair/Chair to Bed: with supervision PT Transfer Goal: Bed to Chair/Chair to Bed - Progress: Goal set today Pt will Ambulate: >150 feet;with supervision;with least restrictive assistive device PT Goal: Ambulate - Progress: Goal set today Pt will Go Up / Down Stairs: 3-5 stairs;with rail(s);with min assist PT Goal: Up/Down Stairs - Progress: Goal set today Pt will Perform Home Exercise Program: with supervision, verbal cues required/provided PT Goal: Perform Home Exercise Program - Progress: Goal set today  Visit Information  Last PT Received On: 02/05/12 Assistance Needed: +1 PT/OT Co-Evaluation/Treatment: Yes    Subjective Data  Subjective: "Who's is this.....(gown)" Patient Stated Goal: None stated by pt. Son Ryan Hess) present and plan is for home.    Prior Functioning  Home Living Lives With: Spouse (spouse unable to assist) Available Help at Discharge: Family;Other (Comment) (son reports they will have to work on 24/7 assist) Type of Home: House Home  Access: Stairs to enter [employer] of Steps: 5 Home  Layout: One level;Other (Comment) (one step down into den with a rail) Bathroom Shower/Tub: Tub/shower unit;Other (comment) (grab bar) Bathroom Toilet: Handicapped height (grab bar) Home Adaptive Equipment: None Prior Function Level of Independence: Needs assistance (was independent with no device prior to surgery in april) Needs Assistance: Bathing;Dressing;Toileting Bath:  (per son was receiving help with ADL. Not clear how much) Communication Communication: No difficulties    Cognition  Overall Cognitive Status: Impaired Area of Impairment: Memory;Following commands;Safety/judgement;Awareness of errors;Problem solving;Awareness of deficits Arousal/Alertness: Awake/alert Orientation Level: Disoriented to;Time;Situation;Place Behavior During Session: [facility] for tasks performed Memory: Decreased recall of precautions Following Commands: Follows multi-step commands inconsistently Safety/Judgement: Decreased awareness of safety precautions;Decreased safety judgement for tasks assessed;Impulsive;Decreased awareness of need for assistance Awareness of Errors: Assistance required to identify errors made    Extremity/Trunk Assessment Right Lower Extremity Assessment RLE ROM/Strength/Tone: [facility] for tasks assessed RLE Coordination: WFL - gross motor Left Lower Extremity Assessment LLE ROM/Strength/Tone: WFL for tasks assessed LLE Coordination: WFL - gross motor Trunk Assessment Trunk Assessment: Normal   Balance    End of Session PT - End of Session Equipment Utilized During Treatment: Gait belt Activity Tolerance: Patient tolerated treatment well Patient left: in chair;with call bell/phone within reach;with chair alarm set   Ryan Hess 02/05/2012, 11:13 AM [phone]

## 2012-02-05 NOTE — Progress Notes (Signed)
ANTICOAGULATION CONSULT NOTE - Initial Consult  Pharmacy Consult for Warfarin Indication: atrial fibrillation  No Known Allergies  Patient Measurements: Height: 5\' 9"  (175.3 cm) (as reported 01/11/12) Weight: 172 lb 2.9 oz (78.1 kg) IBW/kg (Calculated) : 70.7   Vital Signs: Temp: 97.5 F (36.4 C) (05/13 1405) Temp src: Oral (05/13 1405) BP: 91/61 mmHg (05/13 1405) Pulse Rate: 73  (05/13 1405)  Labs:  Basename 02/05/12 1219 02/05/12 0500 02/04/12 0538 02/03/12 1630  HGB -- 12.1* 11.8* --  HCT -- 35.9* 34.9* 39.0  PLT -- 197 181 174  APTT -- -- -- 31  LABPROT 15.6* -- -- 15.7*  INR 1.21 -- -- 1.22  HEPARINUNFRC -- -- -- --  CREATININE -- 1.07 1.19 1.35  CKTOTAL -- -- -- --  CKMB -- -- -- --  TROPONINI -- -- -- --    Estimated Creatinine Clearance: 49.6 ml/min (by C-G formula based on Cr of 1.07).   Medical History: Past Medical History  Diagnosis Date  . COPD (chronic obstructive pulmonary disease)   . Pneumonia, organism unspecified   . Pulmonary nodule   . HTN (hypertension)   . CHF (congestive heart failure)   . Atrial fibrillation   . Drug therapy   . PVC (premature ventricular contraction)   . PVD (peripheral vascular disease)   . Thoracic aortic aneurysm   . Hypercholesteremia   . Diverticulosis of colon   . Colon polyps   . Prostate cancer   . DJD (degenerative joint disease)   . Actinic skin damage   . Hyperlipidemia   . Internal hemorrhoids     Medications:  Scheduled:    . antiseptic oral rinse  15 mL Mouth Rinse q12n4p  . antiseptic oral rinse  15 mL Mouth Rinse BID  . aspirin EC  81 mg Oral Daily  . atenolol  50 mg Oral Daily  . atorvastatin  10 mg Oral q1800  . ceFEPime (MAXIPIME) IV  1 g Intravenous Q8H  . chlorhexidine  15 mL Mouth Rinse BID  . enoxaparin (LOVENOX) injection  40 mg Subcutaneous Q24H  . guaiFENesin  600 mg Oral BID  . levofloxacin (LEVAQUIN) IV  750 mg Intravenous Q24H  . Tamsulosin HCl  0.8 mg Oral QHS  .  tiotropium  18 mcg Inhalation Daily  . vancomycin  750 mg Intravenous Q12H  . warfarin  5 mg Oral ONCE-1800  . Warfarin - Pharmacist Dosing Inpatient   Does not apply q1800  . DISCONTD: warfarin  2.5 mg Oral ONCE-1800   Infusions:    . sodium chloride 75 mL/hr at 02/04/12 2000    Assessment:  78 yo M with history of chronic anticoagulation for chronic Afib  Warfarin PTA 2.5mg  PO once daily, on hold since 4/12 due to laminectomy and epidural hematoma evacuation (4/12) with plan per neurosurgery to resume in one month.  INR 1.21, CBC stable  Lovenox 40mg  SQ once daily ordered per MD  Noted DDI with levofloxacin (stop date 5/14)  Goal of Therapy:  INR 2-3 Monitor platelets by anticoagulation protocol: Yes   Plan:  Boosted warfarin dose 5mg  PO tonight. Daily INR Continue prophylactic dose Lovenox.  Lynann Beaver PharmD, BCPS Pager [phone] 02/05/2012 3:13 PM

## 2012-02-05 NOTE — Progress Notes (Addendum)
Subjective: Much more alert and awake. Is asking when he can go home. No family members currently present.  Objective: Vital signs in last 24 hours: Temp:  [97.4 F (36.3 C)-97.8 F (36.6 C)] 97.5 F (36.4 C) (05/13 1405) Pulse Rate:  [58-92] 73  (05/13 1405) Resp:  [18-38] 20  (05/13 1405) BP: (91-111)/(60-70) 91/61 mmHg (05/13 1405) SpO2:  [95 %-99 %] 98 % (05/13 1405) Weight:  [78.1 kg (172 lb 2.9 oz)] 78.1 kg (172 lb 2.9 oz) (05/13 0551) Weight change: -5.2 kg (-11 lb 7.4 oz) Last BM Date: 02/03/12  Intake/Output from previous day: 05/12 0701 - 05/13 0700 In: 2045 [P.O.:720; I.V.:1125; IV Piggyback:200] Out: 1925 [Urine:1925] Total I/O In: 480 [P.O.:480] Out: 350 [Urine:350]   Physical Exam: General: Alert, awake, oriented x3, in no acute distress. HEENT: No bruits, no goiter. Heart: Regular rate and rhythm, without murmurs, rubs, gallops. Lungs: Bilateral crackles. Abdomen: Soft, nontender, nondistended, positive bowel sounds. Extremities: No clubbing cyanosis or edema with positive pedal pulses. Neuro: Grossly intact, nonfocal.    Lab Results: Basic Metabolic Panel:  Basename 02/05/12 0500 02/04/12 0538  NA 133* 132*  K 4.1 4.2  CL 103 99  CO2 22 24  GLUCOSE 82 81  BUN 24* 25*  CREATININE 1.07 1.19  CALCIUM 8.3* 8.4  MG -- --  PHOS -- --   Liver Function Tests:  Basename 02/04/12 0538 02/03/12 1630  AST 20 28  ALT 18 25  ALKPHOS 161* 208*  BILITOT 2.3* 2.6*  PROT 5.3* 6.5  ALBUMIN 2.3* 2.9*   CBC:  Basename 02/05/12 0500 02/04/12 0538 02/03/12 1630  WBC 6.2 9.4 --  NEUTROABS -- -- 9.1*  HGB 12.1* 11.8* --  HCT 35.9* 34.9* --  MCV 92.8 92.6 --  PLT 197 181 --   BNP:  Basename 02/03/12 1630  PROBNP 4342.0*   D-Dimer:  Basename 02/03/12 1630  DDIMER 8.23*   Coagulation:  Basename 02/05/12 1219 02/03/12 1630  LABPROT 15.6* 15.7*  INR 1.21 1.22   Urinalysis:  Basename 02/03/12 1628  COLORURINE YELLOW  LABSPEC 1.016  PHURINE  6.0  GLUCOSEU NEGATIVE  HGBUR MODERATE*  BILIRUBINUR NEGATIVE  KETONESUR NEGATIVE  PROTEINUR NEGATIVE  UROBILINOGEN 0.2  NITRITE NEGATIVE  LEUKOCYTESUR NEGATIVE    Recent Results (from the past 240 hour(s))  CULTURE, BLOOD (ROUTINE X 2)     Status: Normal (Preliminary result)   Collection Time   02/03/12  8:05 PM      Component Value Range Status Comment   Specimen Description BLOOD RIGHT ARM  5 ML IN [facility] BOTTLE   Final    Special Requests NONE   Final    Culture  Setup Time [id number]   Final    Culture     Final    Value:        BLOOD CULTURE RECEIVED NO GROWTH TO DATE CULTURE WILL BE HELD FOR 5 DAYS BEFORE ISSUING A FINAL NEGATIVE REPORT   Report Status PENDING   Incomplete   CULTURE, BLOOD (ROUTINE X 2)     Status: Normal (Preliminary result)   Collection Time   02/03/12  8:10 PM      Component Value Range Status Comment   Specimen Description BLOOD RIGHT HAND  5 ML IN [facility] BOTTLE   Final    Special Requests NONE   Final    Culture  Setup Time [id number]   Final    Culture     Final    Value:  BLOOD CULTURE RECEIVED NO GROWTH TO DATE CULTURE WILL BE HELD FOR 5 DAYS BEFORE ISSUING A FINAL NEGATIVE REPORT   Report Status PENDING   Incomplete   MRSA PCR SCREENING     Status: Normal   Collection Time   02/03/12  9:21 PM      Component Value Range Status Comment   MRSA by PCR NEGATIVE  NEGATIVE  Final     Studies/Results: Ct Abdomen Pelvis Wo Contrast  02/03/2012  *RADIOLOGY REPORT*  Clinical Data: Extreme weakness, recent AAA repair  CT ABDOMEN AND PELVIS WITHOUT CONTRAST  Technique:  Multidetector CT imaging of the abdomen and pelvis was performed following the standard protocol without intravenous contrast.  Comparison: CT abdomen 10 /18/2011  Findings: There is bibasilar air space disease which is slightly increased compared to prior.  No pericardial fluid.  There is an aortic stent graft in the descending thoracic aorta. There  arebi- iliac stent grafts distally.   No focal hepatic lesion.  Gallbladder, pancreas, spleen, and adrenal glands are normal.  There is mild hydronephrosis of the right kidney.  Left kidney is normal.  The bladder is distended. Bladder distention and right hydronephrosis are new from prior.  There is no free fluid the pelvis.  Prostate gland demonstrate brachytherapy seeds inferiorly.  No pelvic lymphadenopathy. Review of  bone windows demonstrates no aggressive osseous lesions.  The IMPRESSION:  1.  New bladder distention and mild  right hydronephrosis. 2.  Thoracic stent graft without complication. 3.  Bibasilar air space disease.  Favor edema over pneumonia or aspiration pneumonitis.  Original Report Authenticated By: Genevive Bi, M.D.   Ct Head Wo Contrast  02/03/2012  *RADIOLOGY REPORT*  Clinical Data: Extremity weakness and tremors.  CT HEAD WITHOUT CONTRAST  Technique:  Contiguous axial images were obtained from the base of the skull through the vertex without contrast.  Comparison: CT head without contrast 01/23/2012.  Findings: Atrophy and extensive white matter changes are similar to the prior study.  There are remote lacunar infarcts of the basal ganglia, particularly in the right caudate head and thalamus.  No acute cortical infarct, hemorrhage, mass lesion is present.  The ventricles are proportionate to the degree of atrophy.  No significant extra-axial fluid collection is present.  The paranasal sinuses are clear.  Sclerosis of the left mastoid air cells is again noted.  The osseous skull is intact.  IMPRESSION:  1.  No acute intracranial abnormality or significant interval change. 2.  Stable atrophy and extensive white matter disease.  This likely reflects the sequelae of chronic microvascular ischemia.  Original Report Authenticated By: Jamesetta Orleans. MATTERN, M.D.   Ct Angio Chest W/cm &/or Wo Cm  02/03/2012  *RADIOLOGY REPORT*  Clinical Data: Status post thoracic aneurysm repair.  Elevated D- dimer.  Hypotension.  CT  ANGIOGRAPHY CHEST  Technique:  Multidetector CT imaging of the chest using the standard protocol during bolus administration of intravenous contrast. Multiplanar reconstructed images including MIPs were obtained and reviewed to evaluate the vascular anatomy.  Contrast: OMNIPAQUE IOHEXOL 300 MG/ML  SOLN  Comparison: CT chest 08/31/2011.  Plain film chest 02/03/2012.  Findings: No pulmonary embolus is identified.  Stent graft in the descending thoracic aorta is seen.  There is cardiomegaly.  No pleural or pericardial effusion.  No axillary, hilar or mediastinal lymphadenopathy.  There is some fluid in the esophagus compatible with poor motility and/or reflux.  Lungs demonstrate extensive centrilobular emphysema.  The patient has airspace disease posterior of the lung  bases, worse on the left.  Incidentally imaged upper abdomen demonstrates low attenuation lesion the right kidney most consistent with a cyst which is partially visualized.  There is no focal bony abnormality.  IMPRESSION:  1.  Negative for pulmonary embolus. 2.  Left worse than right basilar airspace disease worrisome for pneumonia or less likely aspiration, particularly on the left. 3.  Status post thoracic aortic aneurysm repair with a stent graft in place. 4.  Cardiomegaly. 5.  Fluid in the esophagus compatible with fluid tali reflux. 6.  Emphysema.  Original Report Authenticated By: Bernadene Bell. Maricela Curet, M.D.   Dg Chest Port 1 View  02/03/2012  *RADIOLOGY REPORT*  Clinical Data: Atrial fibrillation  PORTABLE CHEST - 1 VIEW  Comparison: Chest radiograph 01/16/2012  Findings: Thoracic stent graft noted.  Stable enlarged heart silhouette.  There is bilateral air space disease worse in the left lower lobe.  Upper lungs are relatively clear.  IMPRESSION: Asymmetric bibasilar air space disease representing edema or infection.  Original Report Authenticated By: Genevive Bi, M.D.    Medications: Scheduled Meds:    . antiseptic oral rinse   15 mL Mouth Rinse q12n4p  . antiseptic oral rinse  15 mL Mouth Rinse BID  . aspirin EC  81 mg Oral Daily  . atenolol  50 mg Oral Daily  . atorvastatin  10 mg Oral q1800  . ceFEPime (MAXIPIME) IV  1 g Intravenous Q8H  . chlorhexidine  15 mL Mouth Rinse BID  . enoxaparin (LOVENOX) injection  40 mg Subcutaneous Q24H  . guaiFENesin  600 mg Oral BID  . levofloxacin (LEVAQUIN) IV  750 mg Intravenous Q24H  . Tamsulosin HCl  0.8 mg Oral QHS  . tiotropium  18 mcg Inhalation Daily  . vancomycin  750 mg Intravenous Q12H  . warfarin  5 mg Oral ONCE-1800  . Warfarin - Pharmacist Dosing Inpatient   Does not apply q1800  . DISCONTD: warfarin  2.5 mg Oral ONCE-1800   Continuous Infusions:    . sodium chloride 75 mL/hr at 02/04/12 2000   PRN Meds:.acetaminophen, acetaminophen, ondansetron (ZOFRAN) IV, ondansetron  Assessment/Plan:  Active Problems:  Atrial fibrillation  Healthcare-associated pneumonia  Urinary retention  Hyponatremia   #1 healthcare associated pneumonia: Agree with current antibiotic therapy of vancomycin, cefepime, Levaquin. Continue to monitor fever and white blood cell curve. Improving. If afebrile overnight we'll transition over to oral antibiotics.  #2 urinary retention: This seems to be a problem with him ever since he had his laminectomy surgery for his epidural hematoma. Will remove Foley and try to do a voiding trial today. If fails will need frequent in and out caths versus permanent Foley with outpatient urology followup.  #3 hyponatremia: Sodium is up to 133 from 129. I suspect this was related to some mild intravascular volume depletion. Continue normal saline.  #4 generalized weakness: PT recommends skilled nursing facility versus home with 24-hour supervision. Of note patient recently completed a stay at [facility] and was sent to SNF. Family was not very happy with care that he received at skilled nursing facility and is wondering about what  options they have to take him home. Will ask case management and social work to look into this in detail as I anticipate patient will be medically ready for discharge home within the next 24-48 hours..  #5 atrial fibrillation: Patient's Coumadin had been placed on hold per neurosurgery recommendations for one month following his epidural hematoma. Per discharge summary from [facility] it  is safe to restart Coumadin on May 12. I will restart Coumadin today. I see no reason to bridge him with Lovenox at this time as the indication for Coumadin is chronic A. fib.   LOS: 2 days   [id number] Pager: [phone] 02/05/2012, 3:13 PM

## 2012-02-06 ENCOUNTER — Inpatient Hospital Stay (HOSPITAL_COMMUNITY): Payer: Medicare Other

## 2012-02-06 DIAGNOSIS — R5383 Other fatigue: Secondary | ICD-10-CM

## 2012-02-06 DIAGNOSIS — D696 Thrombocytopenia, unspecified: Secondary | ICD-10-CM

## 2012-02-06 DIAGNOSIS — R339 Retention of urine, unspecified: Secondary | ICD-10-CM

## 2012-02-06 DIAGNOSIS — G9340 Encephalopathy, unspecified: Secondary | ICD-10-CM

## 2012-02-06 DIAGNOSIS — E860 Dehydration: Secondary | ICD-10-CM

## 2012-02-06 DIAGNOSIS — R5381 Other malaise: Secondary | ICD-10-CM

## 2012-02-06 LAB — CBC
HCT: 35.3 % — ABNORMAL LOW (ref 39.0–52.0)
Hemoglobin: 12 g/dL — ABNORMAL LOW (ref 13.0–17.0)
MCH: 31.7 pg (ref 26.0–34.0)
MCHC: 34 g/dL (ref 30.0–36.0)
MCV: 93.1 fL (ref 78.0–100.0)
Platelets: 225 10*3/uL (ref 150–400)
RBC: 3.79 MIL/uL — ABNORMAL LOW (ref 4.22–5.81)
RDW: 15.3 % (ref 11.5–15.5)
WBC: 6.3 10*3/uL (ref 4.0–10.5)

## 2012-02-06 LAB — VANCOMYCIN, TROUGH: Vancomycin Tr: 14.9 ug/mL (ref 10.0–20.0)

## 2012-02-06 LAB — BASIC METABOLIC PANEL
BUN: 18 mg/dL (ref 6–23)
CO2: 22 mEq/L (ref 19–32)
Calcium: 8.5 mg/dL (ref 8.4–10.5)
Chloride: 102 mEq/L (ref 96–112)
Creatinine, Ser: 1.03 mg/dL (ref 0.50–1.35)
GFR calc Af Amer: 74 mL/min — ABNORMAL LOW (ref >90)
GFR calc non Af Amer: 64 mL/min — ABNORMAL LOW (ref >90)
Glucose, Bld: 90 mg/dL (ref 70–99)
Potassium: 3.8 mEq/L (ref 3.5–5.1)
Sodium: 133 mEq/L — ABNORMAL LOW (ref 135–145)

## 2012-02-06 LAB — PROTIME-INR
INR: 1.29 (ref 0.00–1.49)
Prothrombin Time: 16.3 seconds — ABNORMAL HIGH (ref 11.6–15.2)

## 2012-02-06 MED ORDER — LORAZEPAM 2 MG/ML IJ SOLN
1.0000 mg | Freq: Once | INTRAMUSCULAR | Status: AC
  Administered 2012-02-06: 1 mg via INTRAVENOUS
  Filled 2012-02-06: qty 1

## 2012-02-06 MED ORDER — LORAZEPAM 2 MG/ML IJ SOLN
INTRAMUSCULAR | Status: AC
  Filled 2012-02-06: qty 1

## 2012-02-06 MED ORDER — LORAZEPAM 2 MG/ML IJ SOLN: 1.0000 mg | Freq: Once | INTRAMUSCULAR | Status: DC

## 2012-02-06 MED ORDER — LORAZEPAM 2 MG/ML IJ SOLN
1.0000 mg | Freq: Once | INTRAMUSCULAR | Status: AC
  Administered 2012-02-06: 1 mg via INTRAMUSCULAR

## 2012-02-06 MED ORDER — WARFARIN SODIUM 5 MG PO TABS
5.0000 mg | ORAL_TABLET | Freq: Once | ORAL | Status: AC
  Administered 2012-02-06: 5 mg via ORAL
  Filled 2012-02-06: qty 1

## 2012-02-06 MED ORDER — LORAZEPAM 2 MG/ML IJ SOLN
2.0000 mg | Freq: Once | INTRAMUSCULAR | Status: DC
  Filled 2012-02-06: qty 1

## 2012-02-06 MED ORDER — LORAZEPAM 2 MG/ML IJ SOLN
1.0000 mg | Freq: Once | INTRAMUSCULAR | Status: AC
  Administered 2012-02-06: 1 mg via INTRAVENOUS

## 2012-02-06 NOTE — Progress Notes (Signed)
Multiple calls from floor RN regarding pt agitated, confused, wanting to leave. At one point sitting outside of his room refusing to get back in bed. Security called for assist. Safety restraints (soft belt and wrists) and Ativan 1mg  given IM. Ultimately pt managed to get out of restraints and was attempting to strike at staff. During attempts to swing at staff pt fell to floor on buttocks. When he attempted to get up he struck the (R) side of his head on cabinet door. Staff able to get pt back to bed and reapplied restraints (belt, soft wrist and ankles. Upon my arrival to bedside bedside, pt now more calm and cooperative and remains at his baseline orientation (dementia). Pt is noted to be alert and verbal. Will at times follow simple commands. Noted large hematoma on (R) parietal/temporal area w/o open wound. Large approx 3 cm skin tear to (R) posterior FA. Pt was ambulatory after fall and denies any  additional c/o's. Pt on Lovenox and Warfarin. Ct head and cervical spine w/o cm pending.

## 2012-02-06 NOTE — Progress Notes (Signed)
PT/OT/ST Cancellation Note  ___Treatment cancelled today due to medical issues with patient which prohibited therapy  ___ Treatment cancelled today due to patient receiving procedure or test   ___ Treatment cancelled today due to patient's refusal to participate   _x__ Treatment cancelled today due to pt still sleeping after procedure this a.m.-pt was medicated prior. Will check back on another day for treatment session.   Signature: Rebeca Alert, PT [phone]

## 2012-02-06 NOTE — Progress Notes (Signed)
ANTICOAGULATION CONSULT NOTE - Follow up Consult  Pharmacy Consult for Warfarin Indication: atrial fibrillation  No Known Allergies  Patient Measurements: Height: 5\' 9"  (175.3 cm) (as reported 01/11/12) Weight: 177 lb 0.5 oz (80.3 kg) IBW/kg (Calculated) : 70.7   Vital Signs: Temp: 98.1 F (36.7 C) (05/14 0640) Temp src: Oral (05/14 0640) BP: 118/72 mmHg (05/14 0640) Pulse Rate: 96  (05/14 0640)  Labs:  Basename 02/06/12 0515 02/05/12 1219 02/05/12 0500 02/04/12 0538 02/03/12 1630  HGB 12.0* -- 12.1* -- --  HCT 35.3* -- 35.9* 34.9* --  PLT 225 -- 197 181 --  APTT -- -- -- -- 31  LABPROT 16.3* 15.6* -- -- 15.7*  INR 1.29 1.21 -- -- 1.22  HEPARINUNFRC -- -- -- -- --  CREATININE 1.03 -- 1.07 1.19 --  CKTOTAL -- -- -- -- --  CKMB -- -- -- -- --  TROPONINI -- -- -- -- --    Estimated Creatinine Clearance: 51.5 ml/min (by C-G formula based on Cr of 1.03).  Medications:  Scheduled:     . antiseptic oral rinse  15 mL Mouth Rinse BID  . aspirin EC  81 mg Oral Daily  . atenolol  50 mg Oral Daily  . atorvastatin  10 mg Oral q1800  . ceFEPime (MAXIPIME) IV  1 g Intravenous Q8H  . chlorhexidine  15 mL Mouth Rinse BID  . enoxaparin (LOVENOX) injection  40 mg Subcutaneous Q24H  . guaiFENesin  600 mg Oral BID  . levofloxacin (LEVAQUIN) IV  750 mg Intravenous Q24H  . LORazepam      . LORazepam  1 mg Intramuscular Once  . LORazepam  1 mg Intravenous Once  . LORazepam  1 mg Intravenous Once  . LORazepam  2 mg Intravenous Once  . Tamsulosin HCl  0.8 mg Oral QHS  . tiotropium  18 mcg Inhalation Daily  . vancomycin  750 mg Intravenous Q12H  . warfarin  5 mg Oral ONCE-1800  . Warfarin - Pharmacist Dosing Inpatient   Does not apply q1800  . DISCONTD: antiseptic oral rinse  15 mL Mouth Rinse q12n4p  . DISCONTD: LORazepam  1 mg Intravenous Once  . DISCONTD: warfarin  2.5 mg Oral ONCE-1800    Assessment:  78 yo M with history of chronic anticoagulation for chronic  Afib  Warfarin PTA 2.5mg  PO once daily, on hold since 4/12 due to laminectomy and epidural hematoma evacuation (4/12) with plan per neurosurgery to resume in one month.  Lovenox 40mg  SQ once daily   INR 1.29, CBC stable  Noted DDI with levofloxacin (stop date 5/14)  Noted pt fall last night and post-fall assessment with hematoma on head and skin tear on forearm  Goal of Therapy:  INR 2-3 Monitor platelets by anticoagulation protocol: Yes   Plan:  Boosted warfarin dose 5mg  PO tonight. Daily INR Continue prophylactic dose Lovenox.  Lynann Beaver PharmD, BCPS Pager [phone] 02/06/2012 11:34 AM

## 2012-02-06 NOTE — Progress Notes (Signed)
02/06/12 0345 Patient had been very restless and agitated.Patient had walked around the hallways with assistance from staff. Patient refused to go back in room. Security was called because patient was grabbing onto walls and pushing himself against the threshold so as not to go into his room. Security assisted staff to escort patient to his bed. MD on call was notified. The patient was given medication and placed in a soft waist belt restraint. At approximately 0410 the bed alarm sounded and the patient was out of bed. As the staff tried to assist the patient back to bed the patient began to swing his arms at staff. The patient then began to kick and punch and lost his balance and ended up falling on his gluteus maximus.As the patient tried to move, he hit his head on the handle of the drawers below the bench in the room. The patient received a hematoma on the right posterior side of head. MD was notified. New orders given and CT scan was preformed. The son, [name], was notified. The son came to the hospital and is presently sitting with the patient.  The post fall assessment revealed that the patient had a hematoma on the head;a skin tear on the right wrist/forearm area which was dressed with Allevyn(silicone dressing). The neuro checks were within normal limits. Some pain was reported by patient during Range of Motion.Will continue to monitor patient.

## 2012-02-06 NOTE — Progress Notes (Addendum)
ANTIBIOTIC CONSULT NOTE - Follow Up  Pharmacy Consult for Vancomycin; Abx renal adjustment Indication: suspected PNA  No Known Allergies  Patient Measurements: Height: 5\' 9"  (175.3 cm) (as reported 01/11/12) Weight: 177 lb 0.5 oz (80.3 kg) IBW/kg (Calculated) : 70.7 kg  Vital Signs: Temp: 98.1 F (36.7 C) (05/14 0640) Temp src: Oral (05/14 0640) BP: 118/72 mmHg (05/14 0640) Pulse Rate: 96  (05/14 0640)  Labs:  Basename 02/06/12 0515 02/05/12 0500 02/04/12 0538  WBC 6.3 6.2 9.4  HGB 12.0* 12.1* 11.8*  PLT 225 197 181  LABCREA -- -- --  CREATININE 1.03 1.07 1.19   Estimated Creatinine Clearance: 51.5 ml/min (by C-G formula based on Cr of 1.03). No results found for this basename: VANCOTROUGH:2,VANCOPEAK:2,VANCORANDOM:2,GENTTROUGH:2,GENTPEAK:2,GENTRANDOM:2,TOBRATROUGH:2,TOBRAPEAK:2,TOBRARND:2,AMIKACINPEAK:2,AMIKACINTROU:2,AMIKACIN:2, in the last 72 hours   Microbiology: Recent Results (from the past 720 hour(s))  MRSA PCR SCREENING     Status: Normal   Collection Time   01/11/12  8:49 PM      Component Value Range Status Comment   MRSA by PCR NEGATIVE  NEGATIVE  Final   URINE CULTURE     Status: Normal   Collection Time   01/16/12  4:26 PM      Component Value Range Status Comment   Specimen Description URINE, CATHETERIZED   Final    Special Requests NONE   Final    Culture  Setup Time [id number]   Final    Colony Count 80,000 COLONIES/ML   Final    Culture PSEUDOMONAS AERUGINOSA   Final    Report Status 01/19/2012 FINAL   Final    Organism ID, Bacteria PSEUDOMONAS AERUGINOSA   Final   URINE CULTURE     Status: Normal   Collection Time   01/25/12  1:58 PM      Component Value Range Status Comment   Specimen Description URINE, CATHETERIZED   Final    Special Requests NONE   Final    Culture  Setup Time [id number]   Final    Colony Count NO GROWTH   Final    Culture NO GROWTH   Final    Report Status 01/26/2012 FINAL   Final   CULTURE, BLOOD (ROUTINE X 2)      Status: Normal (Preliminary result)   Collection Time   02/03/12  8:05 PM      Component Value Range Status Comment   Specimen Description BLOOD RIGHT ARM  5 ML IN [facility] BOTTLE   Final    Special Requests NONE   Final    Culture  Setup Time [id number]   Final    Culture     Final    Value:        BLOOD CULTURE RECEIVED NO GROWTH TO DATE CULTURE WILL BE HELD FOR 5 DAYS BEFORE ISSUING A FINAL NEGATIVE REPORT   Report Status PENDING   Incomplete   CULTURE, BLOOD (ROUTINE X 2)     Status: Normal (Preliminary result)   Collection Time   02/03/12  8:10 PM      Component Value Range Status Comment   Specimen Description BLOOD RIGHT HAND  5 ML IN [facility] BOTTLE   Final    Special Requests NONE   Final    Culture  Setup Time [id number]   Final    Culture     Final    Value:        BLOOD CULTURE RECEIVED NO GROWTH TO DATE CULTURE WILL BE HELD FOR 5 DAYS BEFORE ISSUING A FINAL NEGATIVE REPORT  Report Status PENDING   Incomplete   MRSA PCR SCREENING     Status: Normal   Collection Time   02/03/12  9:21 PM      Component Value Range Status Comment   MRSA by PCR NEGATIVE  NEGATIVE  Final     Medications:  Anti-infectives     Start     Dose/Rate Route Frequency Ordered Stop   02/04/12 2300   levofloxacin (LEVAQUIN) IVPB 750 mg        750 mg 100 mL/hr over 90 Minutes Intravenous Every 24 hours 02/04/12 1210 02/06/12 0043   02/04/12 1400   ceFEPIme (MAXIPIME) 1 g in dextrose 5 % 50 mL IVPB        1 g 100 mL/hr over 30 Minutes Intravenous 3 times per day 02/04/12 1206 02/11/12 2359   02/04/12 1300   vancomycin (VANCOCIN) 750 mg in sodium chloride 0.9 % 150 mL IVPB        750 mg 150 mL/hr over 60 Minutes Intravenous Every 12 hours 02/04/12 1209 02/11/12 1259   02/04/12 0300   ceFEPIme (MAXIPIME) 1 g in dextrose 5 % 50 mL IVPB  Status:  Discontinued        1 g 100 mL/hr over 30 Minutes Intravenous Every 12 hours 02/03/12 2131 02/04/12 1206   02/03/12 2300   levofloxacin (LEVAQUIN) IVPB 750 mg   Status:  Discontinued        750 mg 100 mL/hr over 90 Minutes Intravenous Every 48 hours 02/03/12 2132 02/04/12 1210   02/03/12 2200   ceFEPIme (MAXIPIME) 1 g in dextrose 5 % 50 mL IVPB  Status:  Discontinued        1 g 100 mL/hr over 30 Minutes Intravenous 3 times per day 02/03/12 2119 02/03/12 2129   02/03/12 2200   vancomycin (VANCOCIN) 1,250 mg in sodium chloride 0.9 % 250 mL IVPB  Status:  Discontinued        1,250 mg 166.7 mL/hr over 90 Minutes Intravenous Every 24 hours 02/03/12 2119 02/03/12 2125   02/03/12 2200   vancomycin (VANCOCIN) 1,250 mg in sodium chloride 0.9 % 250 mL IVPB  Status:  Discontinued        1,250 mg 166.7 mL/hr over 90 Minutes Intravenous Every 24 hours 02/03/12 2125 02/04/12 1209   02/03/12 2130   levofloxacin (LEVAQUIN) IVPB 750 mg  Status:  Discontinued        750 mg 100 mL/hr over 90 Minutes Intravenous Every 24 hours 02/03/12 2119 02/03/12 2132   02/03/12 1830   vancomycin (VANCOCIN) IVPB 1000 mg/200 mL premix  Status:  Discontinued        1,000 mg 200 mL/hr over 60 Minutes Intravenous  Once 02/03/12 1824 02/03/12 2118   02/03/12 1830  piperacillin-tazobactam (ZOSYN) IVPB 3.375 g       3.375 g 12.5 mL/hr over 240 Minutes Intravenous  Once 02/03/12 1824 02/04/12 0027          Assessment:  Pt admitted with increased confusion/hallucination, SOB/congestion/chills/shaking. CT concerning for PNA.  Day #4 Vanc and Cefepime.  Completed 3 days of Levaquin.  Vancomycin trough level 14.9  Renal function has improved greatly, CrCl ~51 ml/min  Goal of Therapy:  Vancomycin trough level 15-20 mcg/ml  Plan:   Continue vancomycin to 750 mg IV q12h.  Continue Cefepime to 1g IV q8h.  Measure Vanc trough at steady state.  Follow up renal fxn and culture results.   Lynann Beaver PharmD, BCPS Pager [phone] 02/06/2012 11:20 AM

## 2012-02-06 NOTE — Progress Notes (Signed)
Subjective: Overnight events noted. Patient became confused and agitated, combative with staff. Had a fall and hit his posterior scalp causing a scalp hematoma. Is still very confused. Is restrained and picking at his soft belt restraint. Mumbles nonsensically. Both sons are present and updated on plan of care.  Objective: Vital signs in last 24 hours: Temp:  [97.5 F (36.4 C)-98.1 F (36.7 C)] 97.5 F (36.4 C) (05/14 1350) Pulse Rate:  [69-103] 69  (05/14 1350) Resp:  [16-20] 18  (05/14 1350) BP: (107-120)/(62-77) 118/72 mmHg (05/14 0640) SpO2:  [96 %-99 %] 97 % (05/14 1350) Weight:  [80.3 kg (177 lb 0.5 oz)] 80.3 kg (177 lb 0.5 oz) (05/14 0235) Weight change: 2.2 kg (4 lb 13.6 oz) Last BM Date: 02/03/12  Intake/Output from previous day: 05/13 0701 - 05/14 0700 In: 1817.5 [P.O.:720; I.V.:847.5; IV Piggyback:250] Out: 601 [Urine:600; Stool:1]     Physical Exam: General: Confused. HEENT: No bruits, no goiter. Heart: Regular rate and rhythm, without murmurs, rubs, gallops. Lungs: Bilateral crackles. Abdomen: Soft, nontender, nondistended, positive bowel sounds. Extremities: No clubbing cyanosis or edema with positive pedal pulses.    Lab Results: Basic Metabolic Panel:  Basename 02/06/12 0515 02/05/12 0500  NA 133* 133*  K 3.8 4.1  CL 102 103  CO2 22 22  GLUCOSE 90 82  BUN 18 24*  CREATININE 1.03 1.07  CALCIUM 8.5 8.3*  MG -- --  PHOS -- --   Liver Function Tests:  Basename 02/04/12 0538 02/03/12 1630  AST 20 28  ALT 18 25  ALKPHOS 161* 208*  BILITOT 2.3* 2.6*  PROT 5.3* 6.5  ALBUMIN 2.3* 2.9*   CBC:  Basename 02/06/12 0515 02/05/12 0500 02/03/12 1630  WBC 6.3 6.2 --  NEUTROABS -- -- 9.1*  HGB 12.0* 12.1* --  HCT 35.3* 35.9* --  MCV 93.1 92.8 --  PLT 225 197 --   BNP:  Basename 02/03/12 1630  PROBNP 4342.0*   D-Dimer:  Basename 02/03/12 1630  DDIMER 8.23*   Coagulation:  Basename 02/06/12 0515 02/05/12 1219  LABPROT 16.3* 15.6*  INR  1.29 1.21   Urinalysis:  Basename 02/03/12 1628  COLORURINE YELLOW  LABSPEC 1.016  PHURINE 6.0  GLUCOSEU NEGATIVE  HGBUR MODERATE*  BILIRUBINUR NEGATIVE  KETONESUR NEGATIVE  PROTEINUR NEGATIVE  UROBILINOGEN 0.2  NITRITE NEGATIVE  LEUKOCYTESUR NEGATIVE    Recent Results (from the past 240 hour(s))  CULTURE, BLOOD (ROUTINE X 2)     Status: Normal (Preliminary result)   Collection Time   02/03/12  8:05 PM      Component Value Range Status Comment   Specimen Description BLOOD RIGHT ARM  5 ML IN [facility] BOTTLE   Final    Special Requests NONE   Final    Culture  Setup Time [id number]   Final    Culture     Final    Value:        BLOOD CULTURE RECEIVED NO GROWTH TO DATE CULTURE WILL BE HELD FOR 5 DAYS BEFORE ISSUING A FINAL NEGATIVE REPORT   Report Status PENDING   Incomplete   CULTURE, BLOOD (ROUTINE X 2)     Status: Normal (Preliminary result)   Collection Time   02/03/12  8:10 PM      Component Value Range Status Comment   Specimen Description BLOOD RIGHT HAND  5 ML IN [facility] BOTTLE   Final    Special Requests NONE   Final    Culture  Setup Time [id number]   Final  Culture     Final    Value:        BLOOD CULTURE RECEIVED NO GROWTH TO DATE CULTURE WILL BE HELD FOR 5 DAYS BEFORE ISSUING A FINAL NEGATIVE REPORT   Report Status PENDING   Incomplete   MRSA PCR SCREENING     Status: Normal   Collection Time   02/03/12  9:21 PM      Component Value Range Status Comment   MRSA by PCR NEGATIVE  NEGATIVE  Final     Studies/Results: Ct Head Wo Contrast  02/06/2012  *RADIOLOGY REPORT*  Clinical Data: The patient fell, striking the right posterior head. History of prostate cancer.  CT HEAD WITHOUT CONTRAST  Technique:  Contiguous axial images were obtained from the base of the skull through the vertex without contrast.  Comparison: 02/03/2012  Findings: Technically limited study due to motion artifact.  There is diffuse cerebral atrophy.  Ventricular dilatation consistent with central  atrophy. Low attenuation changes in the deep white matter consistent with small vessel ischemia.  Since the previous study, there is mild sulci effacement the right posterior parietal region with suggestion of mild low attenuation change extending to the cortex.  This may represent early changes of acute infarct.  No evidence of acute intracranial hemorrhage.  Prominence of the right internal carotid artery at the bifurcation at 7 mm.  Intracranial aneurysm is not excluded.  MRI/MRA recommended for further evaluation.  No abnormal extra-axial fluid collections.  Vascular calcifications.  No depressed skull fractures.  Visualized paranasal sinuses are not opacified.  IMPRESSION: No evidence of acute intracranial hemorrhage.  Suggestion of early ischemic changes in the right posterior parietal region.  Prominent right carotid bifurcation.  Aneurysm should be excluded.  Recommend MRI / MRA for further evaluation.  Chronic atrophy and small vessel ischemic changes.  Original Report Authenticated By: Marlon Pel, M.D.   Ct Cervical Spine Wo Contrast  02/06/2012  *RADIOLOGY REPORT*  Clinical Data: The patient fell, striking the right posterior head.  CT CERVICAL SPINE WITHOUT CONTRAST  Technique:  Multidetector CT imaging of the cervical spine was performed. Multiplanar CT image reconstructions were also generated.  Comparison: None.  Findings: Technically limited study due to motion artifact. Apparent irregularity of the body of C2 appears to be related to motion and streak artifact.  Normal alignment of the cervical vertebrae.  Diffuse demineralization.  Diffuse degenerative changes involving the cervical interspaces and facet joints.  No vertebral compression deformities.  No prevertebral soft tissue swelling.  No focal bone lesion or bone destruction.  Prominent C7 transverse processes.  IMPRESSION: No displaced fractures identified.  Diffuse degenerative changes.  Original Report Authenticated By: Marlon Pel, M.D.   Mr [facility] Wo Contrast  02/06/2012  *RADIOLOGY REPORT*  Clinical Data:  Fall.  Head injury.  Prostate cancer.  CT suggestion of right internal carotid artery aneurysm.  MRI HEAD WITHOUT CONTRAST MRA HEAD WITHOUT CONTRAST  Technique:  Multiplanar, multiecho pulse sequences of the brain and surrounding structures were obtained without intravenous contrast. Angiographic images of the head were obtained using MRA technique without contrast.  Comparison:  CT head 02/06/2012  MRI HEAD  Findings:  The patient was not able to complete the study and there is mild motion on the images that were obtained.  Negative for acute infarct.  Generalized atrophy and moderate chronic microvascular ischemic changes in the white matter.  Prominent   cavernous carotid and supraclinoid internal carotid bilaterally with tortuosity and ectasia, this  appears to account for the CT finding.  Negative for mass lesion.  No fluid collection is present.  IMPRESSION: Atrophy and chronic ischemic changes.  No acute infarct.  The patient could not complete the study and did not  hold still for the images that were obtained.  Ectatic internal carotid artery bilaterally without evidence of aneurysm.  MRA HEAD  Findings: Image quality is limited by motion.  There is decreased signal in the intracranial circulation due to motion and technical factors.  Right vertebral artery ends in pica.  Left vertebral artery not visualized.  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  Internal carotid artery is widely patent.  There is tortuosity of the internal carotid artery accounting for the abnormality on CT. No aneurysm identified.  Anterior and middle cerebral arteries are patent bilaterally however there is poor delineation of the intracranial  circulation.  There is probable intracranial atherosclerotic disease.  IMPRESSION: The study is of limited quality.  Negative for cerebral aneurysm.  Ectatic right internal carotid artery accounts for the CT finding.  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  There is fetal origin of the posterior cerebral artery bilaterally.  Probable intracranial atherosclerotic disease however there is poor delineation of the intracranial circulation on the study.  Original Report Authenticated By: Camelia Phenes, M.D.   Mr Brain Wo Contrast  02/06/2012  *RADIOLOGY REPORT*  Clinical Data:  Fall.  Head injury.  Prostate cancer.  CT suggestion of right internal carotid artery aneurysm.  MRI HEAD WITHOUT CONTRAST MRA HEAD WITHOUT CONTRAST  Technique:  Multiplanar, multiecho pulse sequences of the brain and surrounding structures were obtained without intravenous contrast. Angiographic images of the head were obtained using MRA technique without contrast.  Comparison:  CT head 02/06/2012  MRI HEAD  Findings:  The patient was not able to complete the study and there is mild motion on the images that were obtained.  Negative for acute infarct.  Generalized atrophy and moderate chronic microvascular ischemic changes in the white matter.  Prominent   cavernous carotid and supraclinoid internal carotid bilaterally with tortuosity and ectasia, this appears to account for the CT finding.  Negative for mass lesion.  No fluid collection is present.  IMPRESSION: Atrophy and chronic ischemic changes.  No acute infarct.  The patient could not complete the study and did not  hold still for the images that were obtained.  Ectatic internal carotid artery bilaterally without evidence of aneurysm.  MRA HEAD  Findings: Image quality is limited by motion.  There is decreased signal in the intracranial circulation due to motion and technical factors.  Right vertebral artery ends in pica.  Left vertebral artery  not visualized.  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  Internal carotid artery is widely patent.  There is tortuosity of the internal carotid artery accounting for the abnormality on CT. No aneurysm identified.  Anterior and middle cerebral arteries are patent bilaterally however there is poor delineation of the intracranial circulation.  There is probable intracranial atherosclerotic disease.  IMPRESSION: The study is of limited quality.  Negative for cerebral aneurysm.  Ectatic right internal  carotid artery accounts for the CT finding.  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  There is fetal origin of the posterior cerebral artery bilaterally.  Probable intracranial atherosclerotic disease however there is poor delineation of the intracranial circulation on the study.  Original Report Authenticated By: Camelia Phenes, M.D.    Medications: Scheduled Meds:    . antiseptic oral rinse  15 mL Mouth Rinse BID  . aspirin EC  81 mg Oral Daily  . atenolol  50 mg Oral Daily  . atorvastatin  10 mg Oral q1800  . ceFEPime (MAXIPIME) IV  1 g Intravenous Q8H  . chlorhexidine  15 mL Mouth Rinse BID  . enoxaparin (LOVENOX) injection  40 mg Subcutaneous Q24H  . guaiFENesin  600 mg Oral BID  . levofloxacin (LEVAQUIN) IV  750 mg Intravenous Q24H  . LORazepam      . LORazepam  1 mg Intramuscular Once  . LORazepam  1 mg Intravenous Once  . LORazepam  1 mg Intravenous Once  . LORazepam  2 mg Intravenous Once  . Tamsulosin HCl  0.8 mg Oral QHS  . tiotropium  18 mcg Inhalation Daily  . vancomycin  750 mg Intravenous Q12H  . warfarin  5 mg Oral ONCE-1800  . warfarin  5 mg Oral ONCE-1800  . Warfarin - Pharmacist Dosing Inpatient   Does not apply q1800  . DISCONTD: antiseptic  oral rinse  15 mL Mouth Rinse q12n4p  . DISCONTD: LORazepam  1 mg Intravenous Once   Continuous Infusions:    . sodium chloride 75 mL/hr at 02/06/12 0949   PRN Meds:.acetaminophen, acetaminophen, ondansetron (ZOFRAN) IV, ondansetron  Assessment/Plan:  Active Problems:  Atrial fibrillation  Healthcare-associated pneumonia  Urinary retention  Hyponatremia  Encephalopathy   #1 healthcare associated pneumonia: Agree with current antibiotic therapy of vancomycin, cefepime, Levaquin. Continue to monitor fever and white blood cell curve. Improving. If afebrile overnight we'll transition over to oral antibiotics.  #2 encephalopathy: With results of CT scan, we decided to pursue an MRI as there were concerns for some early ischemic changes. MRI has resulted negative for acute ischemic changes. I suspect that Mr. Cooks has had some sundowning. We'll continue to follow for improvement in mental status.  #2 urinary retention: This seems to be a problem with him ever since he had his laminectomy surgery for his epidural hematoma. Foley catheter has been removed. Per RN report, patient has been voiding frequently and in large quantity.  #3 hyponatremia: Sodium is up to 133 from 129. I suspect this was related to some mild intravascular volume depletion. Continue normal saline.  #4 generalized weakness: PT recommends skilled nursing facility versus home with 24-hour supervision. Of note patient recently completed a stay at [facility] and was sent to SNF. Family was not very happy with care that he received at skilled nursing facility and is wondering about what options they have to take him home. Will ask case management and social work to look into this in detail as I anticipate patient will be medically ready for discharge home within the next 24-48 hours..  #5 atrial fibrillation: Patient's Coumadin had been placed on hold per neurosurgery recommendations for one month  following his epidural hematoma. Per discharge summary from [facility] it is safe to restart Coumadin on May 12. I will restart Coumadin today. I see no reason to bridge him with Lovenox at this time as the indication for Coumadin is chronic  A. fib.  #6 disposition: Plan is for eventual discharge home with home health therapy once his sundowning/encephalopathy has improved. Would consider transitioning his IV antibiotics over to by mouth route tomorrow if he continues to be afebrile and without leukocytosis.   LOS: 3 days   [facility] Pager: [phone] 02/06/2012, 4:09 PM

## 2012-02-07 DIAGNOSIS — R5383 Other fatigue: Secondary | ICD-10-CM

## 2012-02-07 DIAGNOSIS — R339 Retention of urine, unspecified: Secondary | ICD-10-CM

## 2012-02-07 DIAGNOSIS — R5381 Other malaise: Secondary | ICD-10-CM

## 2012-02-07 DIAGNOSIS — D696 Thrombocytopenia, unspecified: Secondary | ICD-10-CM

## 2012-02-07 DIAGNOSIS — E860 Dehydration: Secondary | ICD-10-CM

## 2012-02-07 LAB — CBC
HCT: 37.9 % — ABNORMAL LOW (ref 39.0–52.0)
Hemoglobin: 12.7 g/dL — ABNORMAL LOW (ref 13.0–17.0)
MCH: 31.1 pg (ref 26.0–34.0)
MCHC: 33.5 g/dL (ref 30.0–36.0)
MCV: 92.9 fL (ref 78.0–100.0)
Platelets: 239 10*3/uL (ref 150–400)
RBC: 4.08 MIL/uL — ABNORMAL LOW (ref 4.22–5.81)
RDW: 15.2 % (ref 11.5–15.5)
WBC: 7.3 10*3/uL (ref 4.0–10.5)

## 2012-02-07 LAB — BASIC METABOLIC PANEL
BUN: 16 mg/dL (ref 6–23)
CO2: 19 mEq/L (ref 19–32)
Calcium: 8.9 mg/dL (ref 8.4–10.5)
Chloride: 104 mEq/L (ref 96–112)
Creatinine, Ser: 1.16 mg/dL (ref 0.50–1.35)
GFR calc Af Amer: 64 mL/min — ABNORMAL LOW (ref >90)
GFR calc non Af Amer: 55 mL/min — ABNORMAL LOW (ref >90)
Glucose, Bld: 81 mg/dL (ref 70–99)
Potassium: 4.1 mEq/L (ref 3.5–5.1)
Sodium: 136 mEq/L (ref 135–145)

## 2012-02-07 LAB — PROTIME-INR
INR: 1.63 — ABNORMAL HIGH (ref 0.00–1.49)
Prothrombin Time: 19.6 seconds — ABNORMAL HIGH (ref 11.6–15.2)

## 2012-02-07 LAB — PROCALCITONIN: Procalcitonin: <0.1 ng/mL

## 2012-02-07 MED ORDER — WARFARIN SODIUM 2.5 MG PO TABS
2.5000 mg | ORAL_TABLET | Freq: Once | ORAL | Status: AC
  Administered 2012-02-07: 2.5 mg via ORAL
  Filled 2012-02-07: qty 1

## 2012-02-07 MED ORDER — ATENOLOL 25 MG PO TABS
25.0000 mg | ORAL_TABLET | Freq: Every day | ORAL | Status: DC
  Administered 2012-02-08 – 2012-02-09 (×2): 25 mg via ORAL
  Filled 2012-02-07 (×2): qty 1

## 2012-02-07 MED ORDER — LORAZEPAM 2 MG/ML IJ SOLN: 1.0000 mg | Freq: Once | INTRAMUSCULAR | Status: DC

## 2012-02-07 MED ORDER — SODIUM CHLORIDE 0.9 % IV BOLUS (SEPSIS)
500.0000 mL | Freq: Once | INTRAVENOUS | Status: AC
  Administered 2012-02-07: 500 mL via INTRAVENOUS

## 2012-02-07 MED ORDER — MOXIFLOXACIN HCL 400 MG PO TABS
400.0000 mg | ORAL_TABLET | Freq: Every day | ORAL | Status: DC
  Administered 2012-02-07 – 2012-02-08 (×2): 400 mg via ORAL
  Filled 2012-02-07 (×3): qty 1

## 2012-02-07 NOTE — Progress Notes (Signed)
Physical Therapy Treatment Patient Details Name: Ryan Hess MRN: [id number] DOB: 06-16-1924 Today's Date: 02/07/2012 Time: 8295-6213 PT Time Calculation (min): 30 min  PT Assessment / Plan / Recommendation Comments on Treatment Session  Pt still demonstrating poor cognition and safety awareness. Continue to educate family(daughter) on need for 24 hour supervison and assist at home. Discussed where they could purchase gait belt if they so decided to. Pt very cooperative during session but still somewhat drowsy from mediction.     Follow Up Recommendations  Skilled nursing facility (family refusing);Home health PT with Supervision/Assistance - 24 hour    Barriers to Discharge        Equipment Recommendations  Rolling walker with 5" wheels    Recommendations for Other Services    Frequency Min 3X/week   Plan Discharge plan remains appropriate    Precautions / Restrictions Precautions Precautions: Fall Restrictions Weight Bearing Restrictions: No   Pertinent Vitals/Pain     Mobility  Bed Mobility Bed Mobility: Not assessed Transfers Transfers: Sit to Stand;Stand to Sit Sit to Stand: 4: Min assist;With upper extremity assist;With armrests;From chair/3-in-1 Stand to Sit: To chair/3-in-1;4: Min assist;With upper extremity assist;With armrests Details for Transfer Assistance: VCs safety, technique, hand placement. Assist to rise, stabilize, control descent.  Ambulation/Gait Ambulation/Gait Assistance: 4: Min assist Ambulation Distance (Feet): 200 Feet Assistive device: Rolling walker Ambulation/Gait Assistance Details: Assist to stabilize throughout ambulation. Repeated cues for pt to increase width of BOS and to correct posture. Dyspnea 2/4 Gait Pattern: Step-through pattern;Trunk flexed;Narrow base of support;Scissoring    Exercises     PT Diagnosis:    PT Problem List:   PT Treatment Interventions:     PT Goals Acute Rehab PT Goals PT Goal: Sit to Stand -  Progress: Progressing toward goal PT Goal: Ambulate - Progress: Progressing toward goal  Visit Information  Last PT Received On: 02/07/12 Assistance Needed: +1    Subjective Data  Subjective: "I"m alright. Slept good for 2 days" Patient Stated Goal: None by pt. Per family plan is for home.   Cognition  Overall Cognitive Status: Impaired Area of Impairment: Attention;Memory;Following commands;Safety/judgement;Awareness of errors;Awareness of deficits;Problem solving Arousal/Alertness: Lethargic Orientation Level: Disoriented to;Time;Situation;Place Behavior During Session: Lethargic Current Attention Level: Alternating Memory: Decreased recall of precautions Following Commands: Follows one step commands with increased time Safety/Judgement: Decreased awareness of safety precautions;Decreased safety judgement for tasks assessed;Decreased awareness of need for assistance;Impulsive Awareness of Errors: Assistance required to identify errors made;Assistance required to correct errors made    Balance  Static Standing Balance Static Standing - Level of Assistance: 4: Min assist Dynamic Standing Balance Dynamic Standing - Level of Assistance: 4: Min assist  End of Session PT - End of Session Equipment Utilized During Treatment: Gait belt Activity Tolerance: Patient tolerated treatment well Patient left: in chair;with call bell/phone within reach;with chair alarm set;with family/visitor present    Rebeca Alert [facility] 02/07/2012, 12:06 PM [phone]

## 2012-02-07 NOTE — Progress Notes (Signed)
BP is 86/50 manually and a manual pulse of 67. Pt had no complaints at this time. MD made aware and a 500cc NS bolus was ordered. Also dose of atenolol was decreased for tomorrow's dose. Will give bolus per MD order and continue to monitor pt.   Arta Bruce [facility] 3:32 PM 02/07/2012

## 2012-02-07 NOTE — Progress Notes (Signed)
Patient ID: Ryan Hess, male   DOB: Feb 19, 1924, 78 y.o.   MRN: [id number]  Subjective: No events overnight. Patient denies chest pain, shortness of breath, abdominal pain. Pt still lethargic but improving per nurse's report.  Objective:  Vital signs in last 24 hours:  Filed Vitals:   02/07/12 1046 02/07/12 1423 02/07/12 1439  BP: 111/64  86/50  Pulse: 62 67 67  Temp:  98 F (36.7 C)   SpO2: 96%      Intake/Output from previous day:  Intake/Output Summary (Last 24 hours) at 02/07/12 1558 Last data filed at 02/07/12 1300  Gross per 24 hour  Intake   1340 ml  Output    100 ml  Net   1240 ml    Physical Exam: General: Alert, awake, follows commands appropriately, NAD,still somewhat lethargic but cooperative to examination when awake HEENT: No bruits, no goiter. Dry mucous membranes, no scleral icterus, no conjunctival pallor. Heart: Irregular rate and rhythm, no murmurs, rubs, gallops. Lungs: Clear to auscultation bilaterally but decreased at bases. No wheezing, no rhonchi, no rales.  Abdomen: Soft, nontender, nondistended, positive bowel sounds. Extremities: No clubbing or cyanosis, no pitting edema,  positive pedal pulses. Neuro: Grossly nonfocal.  Lab Results:  Lab 02/07/12 0500 02/06/12 0515 02/05/12 0500 02/04/12 0538 02/03/12 1630  WBC 7.3 6.3 6.2 9.4 10.1  HGB 12.7* 12.0* 12.1* 11.8* 13.3  HCT 37.9* 35.3* 35.9* 34.9* 39.0  PLT 239 225 197 181 174   Lab 02/07/12 0500 02/06/12 0515 02/05/12 0500 02/04/12 0538 02/03/12 1630  NA 136 133* 133* 132* 129*  K 4.1 3.8 4.1 4.2 4.4  CL 104 102 103 99 94*  CO2 19 22 22 24 24   GLUCOSE 81 90 82 81 99  BUN 16 18 24* 25* 30*  CREATININE 1.16 1.03 1.07 1.19 1.35  CALCIUM 8.9 8.5 8.3* 8.4 9.4   Lab 02/07/12 0500 02/06/12 0515 02/05/12 1219 02/03/12 1630  INR 1.63* 1.29 1.21 1.22   Blood Cultures 02/03/2012: preliminary report shows no growth to date MRSA: NEGATIVE  Studies/Results:  Ct Head Wo Contrast 02/06/2012     IMPRESSION:  No evidence of acute intracranial hemorrhage.   Suggestion of early ischemic changes in the right posterior parietal region.   Prominent right carotid bifurcation.  Aneurysm should be excluded.  Recommend MRI / MRA for further evaluation.   Chronic atrophy and small vessel ischemic changes.    Ct Cervical Spine Wo Contrast 02/06/2012     IMPRESSION:  No displaced fractures identified.  Diffuse degenerative changes.    Mr Ryan Hess Head Wo Contrast 02/06/2012    IMPRESSION:  Atrophy and chronic ischemic changes.   No acute infarct.   The patient could not complete the study and did not  hold still for the images that were obtained.   Ectatic internal carotid artery bilaterally without evidence of aneurysm.    MRA HEAD     IMPRESSION:  The study is of limited quality.  Negative for cerebral aneurysm.   Ectatic right internal carotid artery accounts for the CT finding.   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   There is fetal origin of the posterior cerebral artery bilaterally.   Probable intracranial atherosclerotic disease however there is poor delineation of the intracranial circulation on the study.    Medications: Scheduled Meds:  . aspirin EC  81 mg Oral Daily  . atenolol  25 mg Oral Daily  . atorvastatin  10 mg Oral  q1800  . ceFEPime (MAXIPIME) IV  1 g Intravenous Q8H  . chlorhexidine  15 mL Mouth Rinse BID  . enoxaparin injection  40 mg Subcutaneous Q24H  . guaiFENesin  600 mg Oral BID  . Tamsulosin HCl  0.8 mg Oral QHS  . tiotropium  18 mcg Inhalation Daily  . vancomycin  750 mg Intravenous Q12H  . warfarin  2.5 mg Oral ONCE-1800  . warfarin  5 mg Oral ONCE-1800   Continuous Infusions:  . sodium chloride 75 mL/hr at 02/06/12 0949   PRN Meds: .acetaminophen, acetaminophen, ondansetron (ZOFRAN) IV, ondansetron  Assessment/Plan:  Active Problems:  Atrial fibrillation - rate controlled but BP somewhat on the low  side - will change the dose of Atenolol back to 25 mg BID - continue to monitor vitals per floor protocol - continue Coumadin per pharmacy   Healthcare-associated pneumonia - some clinical improvement - please note that pt has received total of 3 days worth of Levaquin and is currently on Vancomycin and Maxipime - pt remains afebrile and WBC is not within normal limits - pt still somewhat lethargic but cooperative to exam when awaken - will plan to switch to PO antibiotic Avelox   Urinary retention - This seems to be a problem with him ever since he had his laminectomy surgery for his epidural hematoma.  - Foley catheter has been removed. Per RN report, patient has been voiding frequently and in large quantity.    Hyponatremia - most likely pre renal etiology - this is now resolved   Encephalopathy - With results of CT scan, we decided to pursue an MRI as there were concerns for some early ischemic changes. - MRI has resulted negative for acute ischemic changes.  - We'll continue to follow for improvement in mental status.    Generalized weakness - PT recommends skilled nursing facility versus home with 24-hour supervision.  - Of note patient [facility] and was sent to SNF.  - Family was not very happy with care that he received at skilled nursing facility and is wondering about what options they have to take him home.    EDUCATION - test results and diagnostic studies were discussed with patient and pt's family who was present at the bedside (son) - patient and family have verbalized the understanding - questions were answered at the bedside and contact information was provided for additional questions or concerns   LOS: 4 days   Ryan Hess 02/07/2012, 3:58 PM  [facility] Pager: [phone]

## 2012-02-07 NOTE — Care Management Note (Signed)
    Page 1 of 1   02/09/2012     11:08:54 AM   CARE MANAGEMENT NOTE 02/09/2012  Patient:  Ryan Hess, Ryan Hess   Account Number:  [id number]  Date Initiated:  02/07/2012  Documentation initiated by:  Lanier Clam  Subjective/Objective Assessment:   ADMITTED W/SOB.     Action/Plan:   FROM HOME W/FAMILY   Anticipated DC Date:  02/09/2012   Anticipated DC Plan:  HOME W HOME HEALTH SERVICES  In-house referral  Clinical Social Worker      DC Planning Services  CM consult      Choice offered to / List presented to:  C-4 Adult Children           Status of service:  Completed, signed off Medicare Important Message given?   (If response is "NO", the following Medicare IM given date fields will be blank) Date Medicare IM given:   Date Additional Medicare IM given:    Discharge Disposition:  HOME W HOME HEALTH SERVICES  Per UR Regulation:  Reviewed for med. necessity/level of care/duration of stay  If discussed at Long Length of Stay Meetings, dates discussed:    Comments:  02/08/12 [facility] RN,BSN NCM [phone] [facility] CHOSEN FOR HHC/DME [facility] FOLLOWING.PLEASE WRITE FOR HHRN/PT/OT/NURSE'S AIDE(IF COVERED BY INSURANCE),RW,3N1,TUB BENCH.  02/07/12 KATHY MAHABIR RN,BSN NCM [phone] PT/OT-SNF.FAMILY WANTS HOME [facility].

## 2012-02-07 NOTE — Progress Notes (Signed)
Occupational Therapy Treatment Patient Details Name: Ryan Hess MRN: [id number] DOB: Jun 06, 1924 Today's Date: 02/07/2012 Time: [id number] OT Time Calculation (min): 30 min  OT Assessment / Plan / Recommendation Comments on Treatment Session Cotx with PT. Discussed DME needs with family member who requests/agreeable to 3in1, tubbench and RW. Pt still with decreased safety awareness and will need 24/7 assist at discharge. Family aware of this recommendation and still plan home after hospital.     Follow Up Recommendations  Would benefit from Skilled nursing facility;but family wanting to take pt home. Recommend Home health OT;Other (comment) [facility] aide)    Barriers to Discharge       Equipment Recommendations  Rolling walker with 5" wheels;3 in 1 bedside comode;Tub/shower bench    Recommendations for Other Services    Frequency Min 2X/week   Plan Discharge plan remains appropriate    Precautions / Restrictions Precautions Precautions: Fall Restrictions Weight Bearing Restrictions: No        ADL  Grooming: Performed;Teeth care;Moderate assistance;Other (comment) (initially mod assist for balance. see below) Where Assessed - Grooming: Standing at sink Where Assessed - Upper Body Bathing: Unsupported Toilet Transfer: Performed;Minimal assistance;Other (comment) (with RW and min verbal cues. assist to safely descend) Toilet Transfer Method: Counselling psychologist: Comfort height toilet;Grab bars Toileting - Clothing Manipulation: Simulated;Minimal assistance Where Assessed - Camera operator Manipulation: Standing Tub/Shower Transfer: Not assessed Tub/Shower Transfer Method: Not assessed Equipment Used: Gait belt ADL Comments: Talked with family member about recommendation of a 3in1 for safety. He has a handicapped toilet at home but he would benefit from armrests on 3in1 to help push up and safely descend to toilet. With only one grab bar to hold this visit, he  needed steady assist to safely sit down. Feel family will be safer  wtih pt being able to help with bilateral UE support of armrests. Family also still wanting tubbench. Pt sleepy intially when therapy came in but able to wake up and participate. Initially mod assist for balance wtih bilateral UE use at sink to perform grooming tasks. Then pt held to sink while brushing teeth and was more min assist. Tends to lean posteriorly if using both hands in activity.     OT Diagnosis:    OT Problem List:   OT Treatment Interventions:     OT Goals ADL Goals ADL Goal: Grooming - Progress: Progressing toward goals ADL Goal: Additional Goal #1 - Progress: Not progressing  Visit Information  Last OT Received On: 02/07/12 Assistance Needed: +1 PT/OT Co-Evaluation/Treatment: Yes    Subjective Data  Subjective: pt smiles again as therapy greets him Patient Stated Goal: none stated but willing to work with therapy   Prior Functioning       Cognition  Overall Cognitive Status: Impaired Area of Impairment: Attention;Memory;Safety/judgement;Following commands;Awareness of errors;Awareness of deficits;Problem solving Arousal/Alertness: Lethargic Orientation Level: Disoriented to;Time;Situation;Place Behavior During Session: Lethargic Current Attention Level: Alternating Memory: Decreased recall of precautions Following Commands: Follows one step commands with increased time Safety/Judgement: Decreased awareness of safety precautions;Decreased safety judgement for tasks assessed;Impulsive Awareness of Errors: Assistance required to correct errors made;Assistance required to identify errors made    Mobility Bed Mobility Bed Mobility: Not assessed Transfers Sit to Stand: 4: Min assist;With upper extremity assist;With armrests;From chair/3-in-1 Stand to Sit: To chair/3-in-1;4: Min assist;With upper extremity assist;With armrests Details for Transfer Assistance: VCs safety, technique, hand placement.  Assist to rise, stabilize, control descent.    Exercises    Balance Balance Balance Assessed: Yes  Static Standing Balance Static Standing - Level of Assistance: 4: Min assist Dynamic Standing Balance Dynamic Standing - Balance Support: During functional activity Dynamic Standing - Level of Assistance: 3: Mod assist;Other (comment) (mod assist initially then min assist with L UE support )  End of Session OT - End of Session Equipment Utilized During Treatment: Gait belt Activity Tolerance: Patient limited by fatigue;Other (comment) (some fatigue and alittle lethargic.) Patient left: in chair;with call bell/phone within reach;with chair alarm set;with family/visitor present   Ryan Hess [phone] 02/07/2012, 12:32 PM

## 2012-02-07 NOTE — Progress Notes (Signed)
ANTICOAGULATION CONSULT NOTE - Follow up [place] for Warfarin Indication: atrial fibrillation  No Known Allergies  Patient Measurements: Height: '5\' 9"'$  (175.3 cm) (as reported 01/11/12) Weight: 177 lb 0.5 oz (80.3 kg) IBW/kg (Calculated) : 70.7   Vital Signs: Temp: 98.4 F (36.9 C) (05/15 0450) Temp src: Axillary (05/15 0450) BP: 122/75 mmHg (05/15 0450) Pulse Rate: 106  (05/15 0450)  Labs:  Basename 02/07/12 0500 02/06/12 0515 02/05/12 1219 02/05/12 0500  HGB 12.7* 12.0* -- --  HCT 37.9* 35.3* -- 35.9*  PLT 239 225 -- 197  APTT -- -- -- --  LABPROT 19.6* 16.3* 15.6* --  INR 1.63* 1.29 1.21 --  HEPARINUNFRC -- -- -- --  CREATININE 1.16 1.03 -- 1.07  CKTOTAL -- -- -- --  CKMB -- -- -- --  TROPONINI -- -- -- --    Estimated Creatinine Clearance: 45.7 ml/min (by C-G formula based on Cr of 1.16).  Medications:  Scheduled:     . antiseptic oral rinse  15 mL Mouth Rinse BID  . aspirin EC  81 mg Oral Daily  . atenolol  50 mg Oral Daily  . atorvastatin  10 mg Oral q1800  . ceFEPime (MAXIPIME) IV  1 g Intravenous Q8H  . chlorhexidine  15 mL Mouth Rinse BID  . enoxaparin (LOVENOX) injection  40 mg Subcutaneous Q24H  . guaiFENesin  600 mg Oral BID  . LORazepam      . LORazepam  1 mg Intravenous Once  . LORazepam  2 mg Intravenous Once  . Tamsulosin HCl  0.8 mg Oral QHS  . tiotropium  18 mcg Inhalation Daily  . vancomycin  750 mg Intravenous Q12H  . warfarin  5 mg Oral ONCE-1800  . Warfarin - Pharmacist Dosing Inpatient   Does not apply q1800    Assessment:  78 yo M with history of chronic anticoagulation for chronic Afib  Warfarin PTA 2.'5mg'$  PO once daily, on hold since 4/12 due to laminectomy and epidural hematoma evacuation (4/12) with plan per neurosurgery to resume in one month.  Lovenox '40mg'$  SQ once daily   INR 1.6, CBC stable  Noted DDI with levofloxacin (stop date 5/14)  Noted pt fall 5/14 AM and post-fall assessment with hematoma on  head and skin tear on forearm.  No further bleeding or complications noted.  Goal of Therapy:  INR 2-3 Monitor platelets by anticoagulation protocol: Yes   Plan:  Warfarin 2.'5mg'$  PO tonight. Daily INR Continue prophylactic dose Lovenox.  Gretta Arab PharmD, BCPS Pager [phone] 02/07/2012 10:23 AM

## 2012-02-08 DIAGNOSIS — D696 Thrombocytopenia, unspecified: Secondary | ICD-10-CM

## 2012-02-08 DIAGNOSIS — R5383 Other fatigue: Secondary | ICD-10-CM

## 2012-02-08 DIAGNOSIS — R339 Retention of urine, unspecified: Secondary | ICD-10-CM

## 2012-02-08 DIAGNOSIS — E860 Dehydration: Secondary | ICD-10-CM

## 2012-02-08 DIAGNOSIS — R5381 Other malaise: Secondary | ICD-10-CM

## 2012-02-08 LAB — BASIC METABOLIC PANEL
BUN: 18 mg/dL (ref 6–23)
CO2: 21 mEq/L (ref 19–32)
Calcium: 8.6 mg/dL (ref 8.4–10.5)
Chloride: 107 mEq/L (ref 96–112)
Creatinine, Ser: 1.4 mg/dL — ABNORMAL HIGH (ref 0.50–1.35)
GFR calc Af Amer: 51 mL/min — ABNORMAL LOW (ref >90)
GFR calc non Af Amer: 44 mL/min — ABNORMAL LOW (ref >90)
Glucose, Bld: 95 mg/dL (ref 70–99)
Potassium: 3.9 mEq/L (ref 3.5–5.1)
Sodium: 136 mEq/L (ref 135–145)

## 2012-02-08 LAB — PROTIME-INR
INR: 2.76 — ABNORMAL HIGH (ref 0.00–1.49)
Prothrombin Time: 29.6 seconds — ABNORMAL HIGH (ref 11.6–15.2)

## 2012-02-08 LAB — CBC
HCT: 35.4 % — ABNORMAL LOW (ref 39.0–52.0)
Hemoglobin: 12.1 g/dL — ABNORMAL LOW (ref 13.0–17.0)
MCH: 31.5 pg (ref 26.0–34.0)
MCHC: 34.2 g/dL (ref 30.0–36.0)
MCV: 92.2 fL (ref 78.0–100.0)
Platelets: 240 10*3/uL (ref 150–400)
RBC: 3.84 MIL/uL — ABNORMAL LOW (ref 4.22–5.81)
RDW: 15.2 % (ref 11.5–15.5)
WBC: 8.1 10*3/uL (ref 4.0–10.5)

## 2012-02-08 MED ORDER — WARFARIN SODIUM 1 MG PO TABS
1.0000 mg | ORAL_TABLET | Freq: Once | ORAL | Status: AC
  Administered 2012-02-08: 1 mg via ORAL
  Filled 2012-02-08: qty 1

## 2012-02-08 NOTE — Progress Notes (Signed)
ANTICOAGULATION CONSULT NOTE - Follow up [place] for Warfarin Indication: atrial fibrillation  No Known Allergies  Patient Measurements: Height: '5\' 9"'$  (175.3 cm) (as reported 01/11/12) Weight: 179 lb 6.4 oz (81.375 kg) IBW/kg (Calculated) : 70.7   Vital Signs: Temp: 97.6 F (36.4 C) (05/16 0638) Temp src: Oral (05/16 0638) BP: 120/73 mmHg (05/16 0638) Pulse Rate: 76  (05/16 0638)  Labs:  Basename 02/08/12 0537 02/07/12 0500 02/06/12 0515  HGB 12.1* 12.7* --  HCT 35.4* 37.9* 35.3*  PLT 240 239 225  APTT -- -- --  LABPROT 29.6* 19.6* 16.3*  INR 2.76* 1.63* 1.29  HEPARINUNFRC -- -- --  CREATININE 1.40* 1.16 1.03  CKTOTAL -- -- --  CKMB -- -- --  TROPONINI -- -- --    Estimated Creatinine Clearance: 37.9 ml/min (by C-G formula based on Cr of 1.4).  Medications:  Scheduled:     . antiseptic oral rinse  15 mL Mouth Rinse BID  . aspirin EC  81 mg Oral Daily  . atenolol  25 mg Oral Daily  . atorvastatin  10 mg Oral q1800  . chlorhexidine  15 mL Mouth Rinse BID  . enoxaparin (LOVENOX) injection  40 mg Subcutaneous Q24H  . guaiFENesin  600 mg Oral BID  . moxifloxacin  400 mg Oral q1800  . sodium chloride  500 mL Intravenous Once  . Tamsulosin HCl  0.8 mg Oral QHS  . tiotropium  18 mcg Inhalation Daily  . warfarin  2.5 mg Oral ONCE-1800  . Warfarin - Pharmacist Dosing Inpatient   Does not apply q1800  . DISCONTD: atenolol  50 mg Oral Daily  . DISCONTD: ceFEPime (MAXIPIME) IV  1 g Intravenous Q8H  . DISCONTD: LORazepam  1 mg Intravenous Once  . DISCONTD: LORazepam  2 mg Intravenous Once  . DISCONTD: vancomycin  750 mg Intravenous Q12H    Assessment:  78 yo M with history of chronic anticoagulation for chronic Afib  Warfarin PTA 2.'5mg'$  PO once daily, on hold since 4/12 due to laminectomy and epidural hematoma evacuation (4/12) with plan per neurosurgery to resume in one month.  Noted pt fall 5/14 AM and post-fall assessment with hematoma on head  and skin tear on forearm.  No further bleeding or complications noted.  CBC stable  INR w/ large increase to 2.76 overnight  Lovenox '40mg'$  SQ once daily   Noted DDI with Avelox (5/15 >>?) and previously levofloxacin (5/12- 5/14)  Goal of Therapy:  INR 2-3   Plan:   Warfarin '1mg'$  PO tonight.  Daily INR  Continue prophylactic dose Lovenox.  Gretta Arab PharmD, BCPS Pager [phone] 02/08/2012 10:53 AM

## 2012-02-08 NOTE — Progress Notes (Signed)
Subjective: Lying in bed, arouses to gentle touch. Family reports "restless" night. Denies pain, reports "dry mouth". NAD  Objective: Vital signs Filed Vitals:   02/07/12 1439 02/07/12 1747 02/07/12 2146 02/08/12 0638  BP: 86/50 118/70 117/77 120/73  Pulse: 67  62 76  Temp:   97.6 F (36.4 C) 97.6 F (36.4 C)  TempSrc:   Oral Oral  Resp:   19 18  Height:      Weight:    81.375 kg (179 lb 6.4 oz)  SpO2:   95% 94%   Weight change:  Last BM Date: 02/06/12  Intake/Output from previous day: 05/15 0701 - 05/16 0700 In: 2382.5 [P.O.:600; I.V.:1782.5] Out: 425 [Urine:425]     Physical Exam: General: Drowsy, easily aroused oriented to self only, in no acute distress. HEENT: No bruits, no goiter. Mucus membranes mouth dry/pale Heart:Iregular rate and rhythm, without murmurs, rubs, gallops. Lungs:  Normal effort but shallow. Breath sounds distant but clear to auscultation bilaterally. Moist cough Abdomen: Soft, nontender, nondistended, positive bowel sounds. Extremities: No clubbing cyanosis or edema with positive pedal pulses. Neuro: Grossly intact, nonfocal. Somnolent. Attempts to follow commands.     Lab Results: Basic Metabolic Panel:  Basename 02/08/12 0537 02/07/12 0500  NA 136 136  K 3.9 4.1  CL 107 104  CO2 21 19  GLUCOSE 95 81  BUN 18 16  CREATININE 1.40* 1.16  CALCIUM 8.6 8.9  MG -- --  PHOS -- --   Liver Function Tests: No results found for this basename: AST:2,ALT:2,ALKPHOS:2,BILITOT:2,PROT:2,ALBUMIN:2 in the last 72 hours No results found for this basename: LIPASE:2,AMYLASE:2 in the last 72 hours No results found for this basename: AMMONIA:2 in the last 72 hours CBC:  Basename 02/08/12 0537 02/07/12 0500  WBC 8.1 7.3  NEUTROABS -- --  HGB 12.1* 12.7*  HCT 35.4* 37.9*  MCV 92.2 92.9  PLT 240 239   Cardiac Enzymes: No results found for this basename: CKTOTAL:3,CKMB:3,CKMBINDEX:3,TROPONINI:3 in the last 72 hours BNP: No results found for this  basename: PROBNP:3 in the last 72 hours D-Dimer: No results found for this basename: DDIMER:2 in the last 72 hours CBG: No results found for this basename: GLUCAP:6 in the last 72 hours Hemoglobin A1C: No results found for this basename: HGBA1C in the last 72 hours Fasting Lipid Panel: No results found for this basename: CHOL,HDL,LDLCALC,TRIG,CHOLHDL,LDLDIRECT in the last 72 hours Thyroid Function Tests: No results found for this basename: TSH,T4TOTAL,FREET4,T3FREE,THYROIDAB in the last 72 hours Anemia Panel: No results found for this basename: VITAMINB12,FOLATE,FERRITIN,TIBC,IRON,RETICCTPCT in the last 72 hours Coagulation:  Basename 02/08/12 0537 02/07/12 0500  LABPROT 29.6* 19.6*  INR 2.76* 1.63*   Urine Drug Screen: Drugs of Abuse  No results found for this basename: labopia, cocainscrnur, labbenz, amphetmu, thcu, labbarb    Alcohol Level: No results found for this basename: ETH:2 in the last 72 hours Urinalysis: No results found for this basename: COLORURINE:2,APPERANCEUR:2,LABSPEC:2,PHURINE:2,GLUCOSEU:2,HGBUR:2,BILIRUBINUR:2,KETONESUR:2,PROTEINUR:2,UROBILINOGEN:2,NITRITE:2,LEUKOCYTESUR:2 in the last 72 hours Misc. Labs:  Recent Results (from the past 240 hour(s))  CULTURE, BLOOD (ROUTINE X 2)     Status: Normal (Preliminary result)   Collection Time   02/03/12  8:05 PM      Component Value Range Status Comment   Specimen Description BLOOD RIGHT ARM  5 ML IN [facility] BOTTLE   Final    Special Requests NONE   Final    Culture  Setup Time [id number]   Final    Culture     Final    Value:  BLOOD CULTURE RECEIVED NO GROWTH TO DATE CULTURE WILL BE HELD FOR 5 DAYS BEFORE ISSUING A FINAL NEGATIVE REPORT   Report Status PENDING   Incomplete   CULTURE, BLOOD (ROUTINE X 2)     Status: Normal (Preliminary result)   Collection Time   02/03/12  8:10 PM      Component Value Range Status Comment   Specimen Description BLOOD RIGHT HAND  5 ML IN [facility] BOTTLE   Final    Special  Requests NONE   Final    Culture  Setup Time [id number]   Final    Culture     Final    Value:        BLOOD CULTURE RECEIVED NO GROWTH TO DATE CULTURE WILL BE HELD FOR 5 DAYS BEFORE ISSUING A FINAL NEGATIVE REPORT   Report Status PENDING   Incomplete   MRSA PCR SCREENING     Status: Normal   Collection Time   02/03/12  9:21 PM      Component Value Range Status Comment   MRSA by PCR NEGATIVE  NEGATIVE  Final     Studies/Results: Mr Virgel Paling Wo Contrast  02/06/2012  *RADIOLOGY REPORT*  Clinical Data:  Fall.  Head injury.  Prostate cancer.  CT suggestion of right internal carotid artery aneurysm.  MRI HEAD WITHOUT CONTRAST MRA HEAD WITHOUT CONTRAST  Technique:  Multiplanar, multiecho pulse sequences of the brain and surrounding structures were obtained without intravenous contrast. Angiographic images of the head were obtained using MRA technique without contrast.  Comparison:  CT head 02/06/2012  MRI HEAD  Findings:  The patient was not able to complete the study and there is mild motion on the images that were obtained.  Negative for acute infarct.  Generalized atrophy and moderate chronic microvascular ischemic changes in the white matter.  Prominent   cavernous carotid and supraclinoid internal carotid bilaterally with tortuosity and ectasia, this appears to account for the CT finding.  Negative for mass lesion.  No fluid collection is present.  IMPRESSION: Atrophy and chronic ischemic changes.  No acute infarct.  The patient could not complete the study and did not  hold still for the images that were obtained.  Ectatic internal carotid artery bilaterally without evidence of aneurysm.  MRA HEAD  Findings: Image quality is limited by motion.  There is decreased signal in the intracranial circulation due to motion and technical factors.  Right vertebral artery ends in pica.  Left vertebral artery not visualized.  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  Internal carotid artery is widely patent.  There is tortuosity of the internal carotid artery accounting for the abnormality on CT. No aneurysm identified.  Anterior and middle cerebral arteries are patent bilaterally however there is poor delineation of the intracranial circulation.  There is probable intracranial atherosclerotic disease.  IMPRESSION: The study is of limited quality.  Negative for cerebral aneurysm.  Ectatic right internal carotid artery accounts for the CT finding.  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  There is fetal origin of the posterior cerebral artery bilaterally.  Probable intracranial atherosclerotic disease however there is poor delineation of the intracranial circulation on the study.  Original Report Authenticated By: Truett Perna, M.D.   Mr Brain Wo Contrast  02/06/2012  *RADIOLOGY  REPORT*  Clinical Data:  Fall.  Head injury.  Prostate cancer.  CT suggestion of right internal carotid artery aneurysm.  MRI HEAD WITHOUT CONTRAST MRA HEAD WITHOUT CONTRAST  Technique:  Multiplanar, multiecho pulse sequences of the brain and surrounding structures were obtained without intravenous contrast. Angiographic images of the head were obtained using MRA technique without contrast.  Comparison:  CT head 02/06/2012  MRI HEAD  Findings:  The patient was not able to complete the study and there is mild motion on the images that were obtained.  Negative for acute infarct.  Generalized atrophy and moderate chronic microvascular ischemic changes in the white matter.  Prominent   cavernous carotid and supraclinoid internal carotid bilaterally with tortuosity and ectasia, this appears to account for the CT finding.  Negative for mass lesion.  No fluid collection is present.  IMPRESSION: Atrophy and chronic  ischemic changes.  No acute infarct.  The patient could not complete the study and did not  hold still for the images that were obtained.  Ectatic internal carotid artery bilaterally without evidence of aneurysm.  MRA HEAD  Findings: Image quality is limited by motion.  There is decreased signal in the intracranial circulation due to motion and technical factors.  Right vertebral artery ends in pica.  Left vertebral artery not visualized.  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  Internal carotid artery is widely patent.  There is tortuosity of the internal carotid artery accounting for the abnormality on CT. No aneurysm identified.  Anterior and middle cerebral arteries are patent bilaterally however there is poor delineation of the intracranial circulation.  There is probable intracranial atherosclerotic disease.  IMPRESSION: The study is of limited quality.  Negative for cerebral aneurysm.  Ectatic right internal carotid artery accounts for the CT finding.  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  There is fetal origin of the posterior cerebral artery bilaterally.  Probable intracranial atherosclerotic disease however there is poor delineation of the intracranial circulation on the study.  Original Report Authenticated By: Truett Perna, M.D.    Medications: Scheduled Meds:   . antiseptic oral rinse  15 mL Mouth Rinse BID  . aspirin EC  81 mg Oral Daily  . atenolol  25 mg Oral Daily  . atorvastatin  10 mg Oral q1800  . chlorhexidine  15 mL Mouth Rinse BID  . enoxaparin (LOVENOX) injection  40 mg Subcutaneous Q24H  . guaiFENesin  600 mg Oral BID  . moxifloxacin  400 mg Oral q1800  . sodium chloride  500 mL Intravenous Once  . Tamsulosin HCl  0.8 mg Oral QHS  .  tiotropium  18 mcg Inhalation Daily  . warfarin  2.5 mg Oral ONCE-1800   Continuous Infusions:   . sodium chloride 75 mL/hr at 02/06/12 0949   PRN Meds:.acetaminophen, acetaminophen, ondansetron (ZOFRAN) IV, ondansetron  Assessment/Plan:  Principal Problem:  *Atrial fibrillation Active Problems:  Healthcare-associated pneumonia  Urinary retention  Hyponatremia  Encephalopathy  Atrial fibrillation  - rate controlled but BP improved but remains somewhat soft. continue Atenolol 25 mg BID  - Continue to monitor vitals per floor protocol  - continue Coumadin per pharmacy   Healthcare-associated pneumonia  - some clinical improvement  - pt remains afebrile and WBC is  within normal limits  -  pt still somewhat lethargic but cooperative to exam when awaken  - Avelox day #2 after 3 days levaquin and then vanc and maxipime   Urinary retention  - This seems to be a problem with him ever since he had his laminectomy surgery for his epidural hematoma.  - Pt has been incontinent x7 last night. No reported incontinence this am. Creatinine up. Hx retention. Will bladder scan.  Acute Kidney injury  - Creatinine 1.4. Hx retention and current incontinent unsure of amount. IV at 75/hr.  - Appears dry. Will continue IV. Will bladder scan. Will hold nephrotoxins. Consider IV bolus if bladder not full.   Hyponatremia  - most likely pre renal etiology  - this is now resolved   Encephalopathy  - With results of CT scan, we decided to pursue an MRI as there were concerns for some early ischemic changes.  - MRI has resulted negative for acute ischemic changes.  -  Little improvement today. We'll continue to follow for improvement in mental status.   Generalized weakness  - PT recommends skilled nursing facility versus home with 24-hour supervision.  - Of note patient [facility] and was sent to SNF.  - Family was not very happy with care that he  received at skilled nursing facility and is wondering about what options they have to take him home.     LOS: 5 days   [facility] M 02/08/2012, 9:49 AM  Pt was seen and examined at bedside. I have reviewed labs and vitals which are stable and pt is hemodynamically stable. I agree with the above's assessment and plan.  MAGICK-Wilkes Potvin, Christian, pager #: [phone] Main office number: [phone]

## 2012-02-09 ENCOUNTER — Telehealth: Payer: Self-pay | Admitting: Pulmonary Disease

## 2012-02-09 DIAGNOSIS — R5383 Other fatigue: Secondary | ICD-10-CM

## 2012-02-09 DIAGNOSIS — D696 Thrombocytopenia, unspecified: Secondary | ICD-10-CM

## 2012-02-09 DIAGNOSIS — R5381 Other malaise: Secondary | ICD-10-CM

## 2012-02-09 DIAGNOSIS — E860 Dehydration: Secondary | ICD-10-CM

## 2012-02-09 DIAGNOSIS — R339 Retention of urine, unspecified: Secondary | ICD-10-CM

## 2012-02-09 LAB — CBC
HCT: 34.3 % — ABNORMAL LOW (ref 39.0–52.0)
Hemoglobin: 11.4 g/dL — ABNORMAL LOW (ref 13.0–17.0)
MCH: 30.8 pg (ref 26.0–34.0)
MCHC: 33.2 g/dL (ref 30.0–36.0)
MCV: 92.7 fL (ref 78.0–100.0)
Platelets: 228 10*3/uL (ref 150–400)
RBC: 3.7 MIL/uL — ABNORMAL LOW (ref 4.22–5.81)
RDW: 15.4 % (ref 11.5–15.5)
WBC: 6.4 10*3/uL (ref 4.0–10.5)

## 2012-02-09 LAB — PROTIME-INR
INR: 2.72 — ABNORMAL HIGH (ref 0.00–1.49)
Prothrombin Time: 29.3 seconds — ABNORMAL HIGH (ref 11.6–15.2)

## 2012-02-09 LAB — BASIC METABOLIC PANEL
BUN: 17 mg/dL (ref 6–23)
CO2: 22 mEq/L (ref 19–32)
Calcium: 8.2 mg/dL — ABNORMAL LOW (ref 8.4–10.5)
Chloride: 107 mEq/L (ref 96–112)
Creatinine, Ser: 1.11 mg/dL (ref 0.50–1.35)
GFR calc Af Amer: 67 mL/min — ABNORMAL LOW (ref >90)
GFR calc non Af Amer: 58 mL/min — ABNORMAL LOW (ref >90)
Glucose, Bld: 89 mg/dL (ref 70–99)
Potassium: 3.6 mEq/L (ref 3.5–5.1)
Sodium: 138 mEq/L (ref 135–145)

## 2012-02-09 MED ORDER — POTASSIUM CHLORIDE ER 10 MEQ PO TBCR: 10.0000 meq | EXTENDED_RELEASE_TABLET | Freq: Two times a day (BID) | ORAL | Status: DC

## 2012-02-09 MED ORDER — ATENOLOL 25 MG PO TABS: 25.0000 mg | ORAL_TABLET | Freq: Every day | ORAL | Status: DC

## 2012-02-09 MED ORDER — TAMSULOSIN HCL 0.4 MG PO CAPS: 0.8000 mg | ORAL_CAPSULE | Freq: Every day | ORAL | Status: DC

## 2012-02-09 MED ORDER — WARFARIN SODIUM 1 MG PO TABS: ORAL_TABLET | ORAL | Status: DC

## 2012-02-09 MED ORDER — MOXIFLOXACIN HCL 400 MG PO TABS: ORAL_TABLET | ORAL | Status: AC

## 2012-02-09 NOTE — Telephone Encounter (Signed)
Per TP: Pt's INR and coumadin therapy are managed by cardiology >> Dr [name] and the coumadin clinic.  Patient actually already has an appt with the coumadin clinic scheduled for Monday 02-12-12.  Thanks.

## 2012-02-09 NOTE — Progress Notes (Signed)
ANTICOAGULATION CONSULT NOTE - Follow up [facility] for Warfarin Indication: atrial fibrillation  No Known Allergies  Patient Measurements: Height: '5\' 9"'$  (175.3 cm) (as reported 01/11/12) Weight: 179 lb 6.4 oz (81.375 kg) IBW/kg (Calculated) : 70.7   Vital Signs: Temp: 97.7 F (36.5 C) (05/17 0410) Temp src: Oral (05/17 0410) BP: 113/65 mmHg (05/17 0410) Pulse Rate: 70  (05/17 0410)  Labs:  Basename 02/09/12 0505 02/08/12 0537 02/07/12 0500  HGB 11.4* 12.1* --  HCT 34.3* 35.4* 37.9*  PLT 228 240 239  APTT -- -- --  LABPROT 29.3* 29.6* 19.6*  INR 2.72* 2.76* 1.63*  HEPARINUNFRC -- -- --  CREATININE 1.11 1.40* 1.16  CKTOTAL -- -- --  CKMB -- -- --  TROPONINI -- -- --    Estimated Creatinine Clearance: 47.8 ml/min (by C-G formula based on Cr of 1.11).  Medications:  Scheduled:     . antiseptic oral rinse  15 mL Mouth Rinse BID  . aspirin EC  81 mg Oral Daily  . atenolol  25 mg Oral Daily  . atorvastatin  10 mg Oral q1800  . chlorhexidine  15 mL Mouth Rinse BID  . enoxaparin (LOVENOX) injection  40 mg Subcutaneous Q24H  . guaiFENesin  600 mg Oral BID  . moxifloxacin  400 mg Oral q1800  . Tamsulosin HCl  0.8 mg Oral QHS  . tiotropium  18 mcg Inhalation Daily  . warfarin  1 mg Oral ONCE-1800  . Warfarin - Pharmacist Dosing Inpatient   Does not apply q1800  . DISCONTD: LORazepam  1 mg Intravenous Once  . DISCONTD: LORazepam  2 mg Intravenous Once  . DISCONTD: Tamsulosin HCl  0.8 mg Oral Daily    Assessment:  78 yo M with history of chronic anticoagulation for chronic Afib  Warfarin PTA 2.'5mg'$  PO once daily, on hold since 4/12 due to laminectomy and epidural hematoma evacuation (4/12) with plan per neurosurgery to resume in one month.  Noted pt fall 5/14 AM and post-fall assessment with hematoma on head and skin tear on forearm.  No further bleeding or complications noted.  CBC stable  INR 2.72 today  Lovenox '40mg'$  SQ once daily  Noted DDI  with Avelox and previously levofloxacin (5/12- 5/14)  Goal of Therapy:  INR 2-3   Plan:   Pt will likely be discharged today  Recommend lower than normal dose of '1mg'$  PO daily while on Avelox (drug interaction that can increase INR)  Close follow-up of INR, with next INR in 1-2 days  D/C prophylactic dose Lovenox.  Gretta Arab PharmD, BCPS Pager [phone] 02/09/2012 8:30 AM

## 2012-02-09 NOTE — Discharge Instructions (Addendum)
Atrial Fibrillation Atrial fibrillation is an abnormal heartbeat (rhythm). It can cause your heart rate to be faster or slower than normal, and can cause clots of blood to form in your heart. These clots can cause other health problems. Atrial fibrillation may be caused by a heart attack, lung problem, or certain medicine. Sometimes the cause of atrial fibrillation is not found. HOME CARE  Take blood thinning medicine (anticoagulants) as told by your doctor. Your doctor will need to draw your blood to check lab values if you take blood thinners.   If you had a cardioversion, limit your activity as told by your doctor.   Learn how to check your heartbeat (pulse) for an abnormal or irregular beat. Your doctor can show you how.   Ask your doctor if it is okay to exercise.   Only take medicine as told by your doctor.  GET HELP RIGHT AWAY IF:   You have trouble breathing or feel dizzy.   You have puffy (swollen) feet or ankles.   You have blood in your pee (urine) or poop (bowel movement).   You feel your heart "skipping" beats.   You feel your heart "racing" or beating fast.   You have weakness in your arms or legs.   You have trouble talking, seeing, or thinking.   You have chest pain or pain in your arm or jaw.  MAKE SURE YOU:   Understand these instructions.   Will watch your condition.   Will get help right away if you are not doing well or get worse.  Document Released: 06/20/2008 Document Revised: 08/31/2011 Document Reviewed: 12/30/2009 [facility] Patient Information 2012 [place], [place].Pneumonia, Adult Pneumonia is an infection of the lungs.  CAUSES Pneumonia may be caused by bacteria or a virus. Usually, these infections are caused by breathing infectious particles into the lungs (respiratory tract). SYMPTOMS   Cough.   Fever.   Chest pain.   Increased rate of breathing.   Wheezing.   Mucus production.  DIAGNOSIS  If you have the common symptoms of  pneumonia, your caregiver will typically confirm the diagnosis with a chest X-ray. The X-ray will show an abnormality in the lung (pulmonary infiltrate) if you have pneumonia. Other tests of your blood, urine, or sputum may be done to find the specific cause of your pneumonia. Your caregiver may also do tests (blood gases or pulse oximetry) to see how well your lungs are working. TREATMENT  Some forms of pneumonia may be spread to other people when you cough or sneeze. You may be asked to wear a mask before and during your exam. Pneumonia that is caused by bacteria is treated with antibiotic medicine. Pneumonia that is caused by the influenza virus may be treated with an antiviral medicine. Most other viral infections must run their course. These infections will not respond to antibiotics.  PREVENTION A pneumococcal shot (vaccine) is available to prevent a common bacterial cause of pneumonia. This is usually suggested for:  People over 35 years old.   Patients on chemotherapy.   People with chronic lung problems, such as bronchitis or emphysema.   People with immune system problems.  If you are over 65 or have a high risk condition, you may receive the pneumococcal vaccine if you have not received it before. In some countries, a routine influenza vaccine is also recommended. This vaccine can help prevent some cases of pneumonia.You may be offered the influenza vaccine as part of your care. If you smoke, it is time  to quit. You may receive instructions on how to stop smoking. Your caregiver can provide medicines and counseling to help you quit. HOME CARE INSTRUCTIONS   Cough suppressants may be used if you are losing too much rest. However, coughing protects you by clearing your lungs. You should avoid using cough suppressants if you can.   Your caregiver may have prescribed medicine if he or she thinks your pneumonia is caused by a bacteria or influenza. Finish your medicine even if you start to  feel better.   Your caregiver may also prescribe an expectorant. This loosens the mucus to be coughed up.   Only take over-the-counter or prescription medicines for pain, discomfort, or fever as directed by your caregiver.   Do not smoke. Smoking is a common cause of bronchitis and can contribute to pneumonia. If you are a smoker and continue to smoke, your cough may last several weeks after your pneumonia has cleared.   A cold steam vaporizer or humidifier in your room or home may help loosen mucus.   Coughing is often worse at night. Sleeping in a semi-upright position in a recliner or using a couple pillows under your head will help with this.   Get rest as you feel it is needed. Your body will usually let you know when you need to rest.  SEEK IMMEDIATE MEDICAL CARE IF:   Your illness becomes worse. This is especially true if you are elderly or weakened from any other disease.   You cannot control your cough with suppressants and are losing sleep.   You begin coughing up blood.   You develop pain which is getting worse or is uncontrolled with medicines.   You have a fever.   Any of the symptoms which initially brought you in for treatment are getting worse rather than better.   You develop shortness of breath or chest pain.  MAKE SURE YOU:   Understand these instructions.   Will watch your condition.   Will get help right away if you are not doing well or get worse.  Document Released: 09/11/2005 Document Revised: 08/31/2011 Document Reviewed: 12/01/2010 [facility] Patient Information 2012 [place], [place].

## 2012-02-09 NOTE — Telephone Encounter (Signed)
Phone msg was closed by accident -- will forward to Leigh -- pls advise.  Thank you.

## 2012-02-09 NOTE — Discharge Summary (Signed)
Physician Discharge Summary  Patient ID: Ryan Hess MRN: [id number] DOB/AGE: [birth year] 76 y.o.  Admit date: 02/03/2012 Discharge date: 02/09/2012  Primary Care Physician:  Ryan Mcalpine, MD, MD   Discharge Diagnoses:  Altered mental status - multifactorial, secondary to HCAP, atrial fibrillation with RVR, progressive failure to thirve  Principal Problem:  *Atrial fibrillation Active Problems:  Healthcare-associated pneumonia  Urinary retention  Hyponatremia  Encephalopathy   Medication List  As of 02/09/2012  9:04 AM   STOP taking these medications         LOVENOX          TAKE these medications         aspirin 81 MG tablet   Take 81 mg by mouth daily.      atenolol 25 MG tablet   Commonly known as: TENORMIN   Take 1 tablet (25 mg total) by mouth daily.      atorvastatin 10 MG tablet   Commonly known as: LIPITOR   Take 10 mg by mouth daily.      furosemide 40 MG tablet   Commonly known as: LASIX   Take 40 mg by mouth daily.      guaiFENesin 600 MG 12 hr tablet   Commonly known as: MUCINEX   Take 600 mg by mouth 2 (two) times daily. COPD.      losartan 100 MG tablet   Commonly known as: COZAAR   Take 100 mg by mouth daily.      moxifloxacin 400 MG tablet   Commonly known as: AVELOX   Last dose on 02/12/12.      multivitamin tablet   Take 1 tablet by mouth daily.      potassium chloride 10 MEQ tablet   Commonly known as: K-DUR   Take 1 tablet (10 mEq total) by mouth 2 (two) times daily.      SPIRIVA HANDIHALER 18 MCG inhalation capsule   Generic drug: tiotropium   INHALE THE CONTENTS OF 1 CAPSULE BY HANDIHALER DAILY      Tamsulosin HCl 0.4 MG Caps   Commonly known as: FLOMAX   Take 0.8 mg by mouth at bedtime.      warfarin 1 MG tablet   Commonly known as: COUMADIN   Take 1 mg daily for 3 days 5/17, 5/18, 5/19. INR check 5/20 for dosage adjustment.             Disposition and Follow-up: pt is medically stable and ready for discharge  to home with HH/family. Will follow up with PCP 1 week. Will follow up with urology 1 week. Please note that pt was discharged with Foley in place and will have to keep it in until seen by urologist.   Consults:  None   Brief H and P: For complete details please refer to admission H and P, but in brief   Ryan Hess is an 78 year old gentleman with a complex past medical history significant for recent endovascular repair of thoracoabdominal aneurysm on 4/8 at [facility], this was complicated by a lumbar epidural hematoma with paraparesis, subsequently underwent laminectomy and evacuation of the epidural hematoma on 4/12 again at [facility], was then discharged to Inpatient rehabilitation at [facility], and then discharged from there to SNF 5days prior to presentation at [facility] ED on 02/03/12. Has had issues related to urinary retention since the epidural hematoma, and gets intermittent I&O catheterizations. Patient presented with son who reported a progressive cognitive decline over the last month, significantly worsened yesterday  with increased confusion and hallucination. He denied any fevers or chills, denied nausea, vomiting, abdominal pain, diarrhea or dysuria. Denied any changes in medications. Son reported seeing him today where he was noted to be slightly more short of breath with congestion and was having chills and shaking and was subsequently brought to the emergency room for evaluation. Had a Foley catheter placed in the emergency room which drained 1500 cc of clear urine, also found to have a sodium of 129 and a CT angiogram of the chest concerning for pneumonia. Pt admitted to medicine service  Physical Exam General: Up in chair eating breakfast. Oriented to self and place. Denies pain/discomfort.   HEENT: No bruits, no goiter. Mucus membranes mouth moist/pale  Heart:Iregular rate and rhythm, without murmurs, rubs, gallops.  Lungs: Normal effort.Breath sounds with improved air flow. Clear to  auscultation bilaterally. Moist cough  Abdomen: Soft, nontender, nondistended, positive bowel sounds.  Extremities: No clubbing cyanosis or edema with positive pedal pulses.  Neuro: Grossly intact, nonfocal. Cranial nerve II-XII intact.   LABS:  Lab 02/09/12 0505 02/08/12 0537 02/07/12 0500 02/06/12 0515 02/05/12 0500 02/03/12 1630  WBC 6.4 8.1 7.3 6.3 6.2 --  HGB 11.4* 12.1* 12.7* 12.0* 12.1* --  HCT 34.3* 35.4* 37.9* 35.3* 35.9* --  PLT 228 240 239 225 197 --   Lab 02/09/12 0505 02/08/12 0537 02/07/12 0500 02/06/12 0515 02/05/12 0500  NA 138 136 136 133* 133*  K 3.6 3.9 4.1 3.8 4.1  CL 107 107 104 102 103  CO2 22 21 19 22 22   GLUCOSE 89 95 81 90 82  BUN 17 18 16 18  24*  CREATININE 1.11 1.40* 1.16 1.03 1.07  CALCIUM 8.2* 8.6 8.9 8.5 8.3*   Lab 02/09/12 0505 02/08/12 0537 02/07/12 0500 02/06/12 0515 02/05/12 1219  INR 2.72* 2.76* 1.63* 1.29 1.21    Significant Diagnostic Studies:   Ct Abdomen Pelvis Wo Contrast 02/03/2012   IMPRESSION:   1.  New bladder distention and mild  right hydronephrosis.  2.  Thoracic stent graft without complication. 3.  Bibasilar air space disease.  Favor edema over pneumonia or aspiration pneumonitis.   Ct Head Wo Contrast 02/03/2012   IMPRESSION:   1.  No acute intracranial abnormality or significant interval change. 2.  Stable atrophy and extensive white matter disease.  This likely reflects the sequelae of chronic microvascular ischemia.   Ct Angio Chest W/cm &/or Wo Cm 02/03/2012   IMPRESSION:   1.  Negative for pulmonary embolus.  2.  Left worse than right basilar airspace disease worrisome for pneumonia or less likely aspiration, particularly on the left.  3.  Status post thoracic aortic aneurysm repair with a stent graft in place.  4.  Cardiomegaly.  5.  Fluid in the esophagus compatible with fluid tali reflux.  6.  Emphysema.    Dg Chest Port 1 View 02/03/2012    IMPRESSION:  Asymmetric bibasilar air space disease representing  edema or infection.    Hospital Course:   Principal Problem:  *Atrial fibrillation Active Problems:  Healthcare-associated pneumonia  Urinary retention  Hyponatremia  Encephalopathy  Healthcare-associated pneumonia  Pt admitted to tele. Blood cultures neg to date. IV Van, cefepime and levaquin started. Sputum cultures no growth. Urine legionella and strep antigen neg. Provided with 02 support and nebs. Gradual improvement. WC WNL and pt afebrile. Antibiotics transitioned to po Avelox after 4 days. Will be discharged with Avelox to complete 10 day total course. At discharge pt afebrile, sats >90% on  room air.   Atrial fibrillation  Remained rate controlled during hospitalization but BP somewhat soft. continue Atenolol 25 mg BID which is lower dose from pre-admission. Will need OP follow up to monitor. Continue coumadin at lower dose due to Avelox. Will need INR check 02/22/12.  Urinary retention  Has been  a problem with him ever since he had his laminectomy surgery for his epidural hematoma. Initially had foley as indicated in HPI. This was discontinued and pt unable to void. Bladder scan on 5/16 yields urine. Foley reinserted. Will be discharged with foley and to follow up with Dr. Annabell Howells 1 week.    Acute Kidney injury  Creatinine increased to 1.4 on 5/16. Hx retention . Pt is not on any nephrotoxic medications at this time. Had been getting IV fluids. Suspected #3. Foley inserted and on day of discharge creatinine back down to 1.1. Will discharge with foley. Will need follow up with PCP 1 week for BMET to evaluate renal function and electrolytes.   Hyponatremia  Most likely pre renal etiology . Was gently hydrated with IV fluids. At discharge sodium 138.   Encephalopathy  With results of CT scan, we decided to pursue an MRI as there were concerns for some early ischemic changes. MRI has resulted negative for acute ischemic changes. Had 2 night episodes of confusion/combativeness.  Suspect related to the urinary retention. At discharge , pt oriented to self and place. Not quite at baseline but improving. Is cooperative and can participate in care.    Generalized weakness  PT recommends skilled nursing facility versus home with 24-hour supervision. Of note [facility] and was sent to SNF. Family was not very happy with care that he received at skilled nursing facility and therefore will be taking pt home with [facility]. Family able to provide 24/7 supervision.   Time spent on Discharge: 40 minutes   Signed: Gwenyth Bender 02/09/2012, 9:04 AM  Debbora Presto  [facility], pager #: [phone] Main office number: [phone]

## 2012-02-09 NOTE — Telephone Encounter (Signed)
Pt's son is aware to keep OV on Monday for coumadin clinic.

## 2012-02-09 NOTE — Telephone Encounter (Signed)
Called, spoke with pt's son, [name].  Pt is currently in [facility].  Supposed to be discharged today.  Per [name], pt will need a PT checked on Monday bc of the coumadin -- would like Dr. Kriste Basque to place order for this.  Dr. Kriste Basque, pls advise if this is ok. Thank you.

## 2012-02-09 NOTE — Progress Notes (Signed)
At Discharge Pt to be dcd with Foley Catheter per orders due to urinary retention. Pt has follow up App. With Urologist. Home Health services arranged for Pt. Pt's son also given foley and leg bag care istructions. Son able to demonstrate and voiced understanding of instructions.CHF home teaching reinforced with Pt's son and packett sent with pt and son. Son verbalized understanding.

## 2012-02-10 LAB — CULTURE, BLOOD (ROUTINE X 2)
Culture  Setup Time: 201305120204
Culture  Setup Time: 201305120205
Culture: NO GROWTH
Culture: NO GROWTH

## 2012-02-12 ENCOUNTER — Ambulatory Visit (INDEPENDENT_AMBULATORY_CARE_PROVIDER_SITE_OTHER): Payer: Medicare Other | Admitting: Pharmacist

## 2012-02-12 DIAGNOSIS — I4891 Unspecified atrial fibrillation: Secondary | ICD-10-CM

## 2012-02-12 LAB — POCT INR: INR: 3.2

## 2012-02-13 ENCOUNTER — Other Ambulatory Visit: Payer: Self-pay | Admitting: Cardiovascular Disease

## 2012-02-13 ENCOUNTER — Other Ambulatory Visit: Payer: Self-pay | Admitting: *Deleted

## 2012-02-13 MED ORDER — ATORVASTATIN CALCIUM 10 MG PO TABS: 10.0000 mg | ORAL_TABLET | Freq: Every day | ORAL | Status: DC

## 2012-02-13 MED ORDER — POTASSIUM CHLORIDE ER 10 MEQ PO TBCR: 10.0000 meq | EXTENDED_RELEASE_TABLET | Freq: Two times a day (BID) | ORAL | Status: DC

## 2012-02-13 MED ORDER — LOSARTAN POTASSIUM 100 MG PO TABS: 100.0000 mg | ORAL_TABLET | Freq: Every day | ORAL | Status: DC

## 2012-02-13 MED ORDER — ATENOLOL 25 MG PO TABS: 25.0000 mg | ORAL_TABLET | Freq: Every day | ORAL | Status: DC

## 2012-02-14 ENCOUNTER — Ambulatory Visit (INDEPENDENT_AMBULATORY_CARE_PROVIDER_SITE_OTHER)
Admission: RE | Admit: 2012-02-14 | Discharge: 2012-02-14 | Disposition: A | Discharge status: Home / Self Care | Payer: Medicare Other | Source: Ambulatory Visit | Attending: Adult Health | Admitting: Adult Health

## 2012-02-14 ENCOUNTER — Encounter: Payer: Self-pay | Admitting: Adult Health

## 2012-02-14 ENCOUNTER — Ambulatory Visit (INDEPENDENT_AMBULATORY_CARE_PROVIDER_SITE_OTHER): Payer: Medicare Other | Admitting: Adult Health

## 2012-02-14 VITALS — BP 116/60 | HR 69 | Temp 96.9°F | Ht 69.0 in | Wt 176.8 lb

## 2012-02-14 DIAGNOSIS — J189 Pneumonia, unspecified organism: Secondary | ICD-10-CM

## 2012-02-14 DIAGNOSIS — R339 Retention of urine, unspecified: Secondary | ICD-10-CM

## 2012-02-14 DIAGNOSIS — I4891 Unspecified atrial fibrillation: Secondary | ICD-10-CM

## 2012-02-14 NOTE — Patient Instructions (Signed)
Continue on current regimen.  follow up Urology as planned tomorrow.  follow up Dr. Kriste Basque  In 4-6 weeks with chest xray  Please contact office for sooner follow up if symptoms do not improve or worsen or seek emergency care

## 2012-02-14 NOTE — Assessment & Plan Note (Signed)
IMproving - finished all abx  cxr w/ decreased opacity.  Will follow up in 4-6 weeks with repeat xray

## 2012-02-14 NOTE — Assessment & Plan Note (Signed)
Cont on current regimen.  follow up cardilogy as planned

## 2012-02-14 NOTE — Assessment & Plan Note (Signed)
Cont w/ foley per urology  follow up as planned tomorrow.

## 2012-02-14 NOTE — Progress Notes (Signed)
Subjective:     Patient ID: Ryan Hess, male   DOB: [birth year], 78 y.o.   MRN: [id number]  HPI  78 y/o WM    ~  Jun10:  3mo follow up doing well- no new complaints or concerns... he saw DrGearhart 4/10- f/u thoracoabdominal aneurysm (s/p AAA &right common iliac aneurysm repair 1998 by Windell Moulding)... 4mo CT scan showed ~no change in the 5cm aneurysm & he rec keep BP under control & f/u in another 4mo... he also had f/u DrWrenn for Urology- f/u Prostate Cancer s/p XRT finished 5/09... doing well w/o symptoms and PSA was down to 0.30- plans f/u another 3mo... finally saw Walker Kehr for 40yr cardiac f/u doing well- no change in meds and yearly ROV is planned...  ~  November 16, 2009:  Hosp 2/4 - 11/05/09 for CP- felt to be noncardiac in origin w/ cath showing nonobstructive CAD but he had new AFib, CHF w/ EF~45% w/ HK & TEE showing left atrial appendage clot... he was diuresed & meds adjusted, placed on Coumadin w/ careful f/u by Walker Kehr & the CC... he had PV f/u w/ CT to check his thoracoabd aneurysm= 5.6cm (min larger serially) & f/u planned 3mo...  from the pulm standpoint he had a bronchitic exac treated w/ Doxy, and reminded to take the Advair/ Spiriva regularly...  currently feeling better w/ Lasix/ KCl added & using inhalers regularly...  ~  March 15, 2010:  he was hosp 4/11 by [facility] w/ RLL Pneum- NOS, treated empirically w/ Vanc/ Zosyn, then Po Avelox & outpt follow up... RLL has not cleared & he developed some hemoptysis (on Coumadin for AFib) & CT Chest 03/11/10 showed emphysema w/ 6cm cavitary lesion RLL & a nodular component inferiorly, +right hilar adenop, no mediastinal adenop, ThorAA measures 5.8cm, etc...  he is an ex-smoker quit 1990 w/ 72yrs up to 2ppd prior... states he feels pretty good- no CP, mild cough, beige phlegm, no blood off the Coumadin x several days now... **we discussed f/u labs (OK-reviewed), check tumor markers CEA=7.5, Ca19-9=11), contact IR re: needle bx of this lesion;  in  the interim> Rx w/ Augmentin 875mg Bid.   **NOTE: IR, DrSchick, felt bx was hi risk w/ his emphysema (prob get pneumothorax, chest tube, etc) & rec holding off on bx for now.  ~  May 04, 2010:  continues to feel well- "I'm doing good"... no CP, no SOB, min cough w/ yellow sput (no change, no blood), walking for exercise... f/u CXR looks similar w/ no change in RLL opacity & we discussed f/u CEA level (6.5) and ROV 31mo w/ CT Chest at that time.  ~  July 15, 2010:  CT Chest shows interval decrease in size of RLL cavitary lesion & the inferiorly placed soft tissue component; otherw there is severe emphysema, cardiomeg, coronary calcif, thoracoabd aneurysm w/o change; there was an air-fluid level in a dilated esoph & pt notes some intermittent choking w/ liquids & "when my mouth is dry"> we decided to proceed w/ Ba Esophagram (nonspecific motility disorder/ presbyesoph, no HH or GERD seen, no stricture etc)... he continues to feel well- min yellow phlegm, w/o cough/ CP/ ch in dyspnea/ fever/ etc...   He saw DrWrenn 8/11- f/u PSA was back down to 0.07 & plans f/u 6 mo.   He saw Walker Kehr 9/11- cardiac stable, BP controlled, holding NSR s/p DCC on Coumadin.   He saw DrGearhardt yest- thoracoabd aneurysm stable & f/u 1 yr.  ~  August 28, 2011:  13  month ROV & he is remarkably w/o complaints at 78 y/o w/ multisystem disease... BP well controlled, denies CP/ palpit/ ch in SOB, edema; LABS show FLP looks goodon Lip10, BMet wnl on Lasix20+K20, CBC ok & TSH borderline (we are following)...    He saw Walker Kehr 9/12 for f/u HBP, CAD, chr AFib on Coumadin, ASPVD> doing satis, cough resolved off Lisinopril (ch to Atacand), stable on meds & Coumadin- no changes made...    He saw DrGearhardt 11/12 for f/u thoraco-abdominal aneurysm, no surg due to age & comorbidities> f/u CT scan 12/12 showed severe emphysema & scarring w/ bibasilar atx (no infiltrates no cavities), markedly tortuous & ectatic Thor Ao w/ extensive  atherosclerotic changes & no change in decr thor aneurysm measuring 5.9 x 5.6cm, mild cardiomeg & dense coronary calcif as well...    He saw DrDBrodie 8/12 for f/u esoph dysmotility & hx colon polyps > known presbyesoph, no stricture, denies swallowing prob; he had neg colonoscopy 2006 (w/o recurrent polyps, +divertics/ hems); no f/u colon needed due to age & comorbidities...    He saw DrWrenn 8/12 for f/u prostate cancer dx in 2009 & treated w/ XRT completed 6/09; slowly rising PSA w/ doubling time 6-50mo; min voiding symptoms, he is considering androgen ablation if needed...  02/14/2012 Post Hospital follow up  Patient returns for a post hospital followup. He was admitted May 11 of 02/09/2012 for altered mental status felt multifactorial, secondary to hospital-acquired pneumonia, atrial fib with RVR, progressive failure to thrive Patient has had a difficult 6 weeks prior to admission. He underwent a aneurysm repair April 8 at [facility]. This was complicated by lumbar epidural hematomas with paraparesis. He subsequently underwent a laminectomy and evacuation of epidural hematoma on April 12 at [facility]. It was [facility]. He then was discharged to a skilled nursing facility 5 days prior to admission on May 11. He has also experienced urinary retention since his epidural hematoma and has had to get intermittent in and out catheterizations.  Patient was treated aggressively with IV antibiotics, and transitioned over to a total of 10 days of Avelox. His atrial fibrillation was addressed with beta blockers. On with Coumadin. He did also experience some acute renal insufficiency. That resolved with IV fluids. Patient had intermittent episodes of altered mental status and some slightly underwent a MRI that was negative for any acute ischemic changes. CXR today shows Interval improvement in aeration at the left base with some  persistent airspace disease at the right  base. Still has foley. Has ov with urologist tomorrow.  PT is at home. Doing exercises at home.  Seems to be better since he is at home.           Problem List:  COPD (ICD-496) Hx of PNEUMONIA, ORGANISM UNSPECIFIED (ICD-486) PULMONARY NODULE & CAVITY RLL (ICD-518.89) - he is an ex-smoker, having quit in 1990 after 40 yrs of smoking... he has been exercising regularly and walking daily ~43mi 5-6 days per week... on ADVAIR 250Bid & SPIRIVA daily, +MUCINEX 2Bid w/ Fluids... ~  baseline CXR & CTChest w/ marked emphysema, atheromatous calcif, 5cm desc thorAA...  ~  CXR & CTA 2/11 in hosp showed cardiomeg + coronary calcif, advanced atherosclerosis of Ao w/ aneuryms 5.8cm into abd, underlying emphysema w/ interst edema & bilat effusions, no PE... ~  4/11:  RLL pneumonia which was slow to clear... ~  6/11:  f/u CXR & CT Chest 6/11 w/ 6cm cavitary area RLL &  nodular component inferiorly, +hilar adenopathy, no mediastinal nodes, etc; Tumor markers showed CEA=7.5 & Ca19-9= 10.8; > IR felt lesion was too hi risk for bx, therefore contin Rx & observe. ~  8/11:  f/u CXR w/o change in RLL opac (no worsening)>> repeat CEA=6.5; plan f/u CT Chest in 80mo. ~  10/11: f/u CT Chest showed interval decrease in size of RLL cavitary lesion & the inferiorly placed soft tissue component; otherw there is severe emphysema, cardiomeg, coronary calcif, thoracoabd aneurysm w/o change; there was an air-fluid level in a dilated esoph> check Ba Esophagram- pending. ~  12/12: f/u CTChest showed severe emphysema & scarring w/ bibasilar atx (no infiltrates no cavities), markedly tortuous & ectaticThorAo w/ extensive atherosclerotic changes & no change in decr thor aneurysm measuring 5.9 x 5.6cm, mild cardiomeg & dense coronary calcif as well...   HYPERTENSION (ICD-401.9)                               **followed by Walker Kehr for Cards... CONGESTIVE HEART FAILURE (ICD-428.0) ATRIAL FIBRILLATION (ICD-427.31) w/ clot in left atrial  appendage >> resolved on f/u TEE, on COUMADIN per Cards & cardioverted. PREMATURE VENTRICULAR CONTRACTIONS, FREQUENT (ICD-427.69) - on ATENOLOL 50mg /d, LOSARTAN 100mg /d, & LASIX 20mg -2AM + KCl 55mEq/d (prev Norvasc discontinued)... BP= 126/82 and feeling better... home BP checks are "all good" w/ high= 140... hx ACE cough in the past on Lisinopril... denies HA, fatigue, visual changes, recurrentCP, palipit, syncope, dyspnea, edema, etc... ~  NuclearStressTest 1/05 was neg- no ischemia or infarct (+diaphrag attenuation), EF=56%... ~  repeat Nuclear study 6/09 was neg- no ischemia, mild inferoapic thinning, not gated due to PVCs... ~  2DEcho 6/09 showed mild dilated LV w/ EF= 45-50% but no regional wall motion abn, mild AoV calcif & AI, LA mild dil... ~  12/10:  changed from Atacand to LOSARTAN to save $$ ~  2/11:  in hosp- Cath= mild nonobstructive 3 vessel CAD, mod LVD w/ EF=40-45%Norvasc stopped & Lasix started... on Coumadin for AFib. ~  Subseq successful [facility] & holding NSR w/ PVCs...  PERIPHERAL VASCULAR DISEASE (ICD-443.9) - on ASA 81mg /d... he is s/p infrarenal AAA repair w/ right common iliac aneurysm repair (via Ao Bi-iliac graft) in 1998 by DrLawson... also has a known desc thor AA measuring ~5+cm and followed by DrGearhardt every 18 months...  ~  seen by DrGearhart 4/10 w/ CT scan showing ~5cm thoracoabdominal aneurysm w/o signif change...  ~  seen by DrGearhart 2/11 in hosp w/ sl incr size of AAA- f/u planned 10mo. ~  also followed by [facility]- DrCooper. ~  seen by DrGearhardt 10/11 & stable, no change, f/u 1 yr. ~  Seen 12/12 by DrGearhardt & CTscan is stable, continue conservative approach...  HYPERCHOLESTEROLEMIA (ICD-272.0) - on LIPITOR 10mg /d...  ~  FLP 5/07 showed TChol 115, Tg 58, HDL 36, LDL 67 ~  FLP 5/08 showed TChol 118, TG 66, HDL 31, LDL 74 ~  FLP 5/09 showed TChol 126, TG 71, HDL 37, LDL 75 ~  FLP 6/10 showed TChol 116, TG 73, HDL 40, LDL 62 ~  2/11:  FLP not checked during the  hospitalization... ~  FLP 4/11 in hosp showed TChol 94, TG 52, HDL 23, LDL 61 ~  FLP 12/12 on Lip10 showed TChol 107, TG 38, HDL 40, LDL 60  DIVERTICULOSIS OF COLON (ICD-562.10) & COLONIC POLYPS (ICD-211.3) - hx polyps in 2003 = tubular adenoma... ~  last colonoscopy 9/06 showed divertics,  hems, no polyps.  PROSTATE CANCER (ICD-185) - eval by DrWrenn and they decided on XRT by DrKinard- finished 5/09 and he states that DrKinard "released me"... he sees DrWrenn every 6 months & they follow PSA closely (notes reviewed)... ~  8/12: f/u prostate cancer dx in 2009 & treated w/ XRT completed 6/09; slowly rising PSA w/ doubling time 6-41mo; min voiding symptoms, he is considering androgen ablation if needed...  DEGENERATIVE JOINT DISEASE (ICD-715.90) - s/p bilat TKR's, [facility]- DrOlin...  ACTINIC SKIN DAMAGE (ICD-692.70) - followed by Elnora Morrison, he knows to avoid sun exposure, use sun screen, etc...   Past Surgical History  Procedure Date  . Total knee arthroplasty     bilateral  . Abdominal aortic aneurysm repair 1998  . Appendectomy   . Inguinal hernia repair     left  . Joint replacement     Outpatient Encounter Prescriptions as of 02/14/2012  Medication Sig Dispense Refill  . aspirin 81 MG tablet Take 81 mg by mouth daily.        [name] atenolol (TENORMIN) 25 MG tablet Take 1 tablet (25 mg total) by mouth daily.  90 tablet  1  . atorvastatin (LIPITOR) 10 MG tablet Take 1 tablet (10 mg total) by mouth daily.  90 tablet  0  . furosemide (LASIX) 40 MG tablet Take 40 mg by mouth daily.      [name] guaiFENesin (MUCINEX) 600 MG 12 hr tablet Take 600 mg by mouth 2 (two) times daily. COPD.      [name] losartan (COZAAR) 100 MG tablet Take 1 tablet (100 mg total) by mouth daily.  90 tablet  1  . Multiple Vitamin (MULTIVITAMIN) tablet Take 1 tablet by mouth daily.        . potassium chloride (K-DUR) 10 MEQ tablet Take 1 tablet (10 mEq total) by mouth 2 (two) times daily.  180 tablet  0  . SPIRIVA HANDIHALER  18 MCG inhalation capsule INHALE THE CONTENTS OF 1 CAPSULE BY HANDIHALER DAILY  1 capsule  3  . Tamsulosin HCl (FLOMAX) 0.4 MG CAPS Take 0.8 mg by mouth at bedtime.      [name] warfarin (COUMADIN) 1 MG tablet Take 1 mg daily for 3 days 5/17, 5/18, 5/19. INR check 5/20 for dosage adjustment.  3 tablet  0  . ADVAIR DISKUS 250-50 MCG/DOSE AEPB Inhale 1 puff into the lungs Twice daily.        No Known Allergies   Current Medications, Allergies, Past Medical History, Past Surgical History, Family History, and Social History were reviewed in [employer] record.   Review of Systems         Constitutional:   No  weight loss, night sweats,  Fevers, chills,  +fatigue, or  lassitude.  HEENT:   No headaches,  Difficulty swallowing,  Tooth/dental problems, or  Sore throat,                No sneezing, itching, ear ache, nasal congestion, post nasal drip,   CV:  No chest pain,  Orthopnea, PND, swelling in lower extremities, anasarca, dizziness, palpitations, syncope.   GI  No heartburn, indigestion, abdominal pain, nausea, vomiting, diarrhea, change in bowel habits, loss of appetite, bloody stools.   Resp:   No excess mucus, no productive cough,  No non-productive cough,  No coughing up of blood.  No change in color of mucus.  No wheezing.  No chest wall deformity  Skin: no rash or lesions.  GU: no dysuria,  change in color of urine,   No flank pain, no hematuria   MS:  No joint pain or swelling.             Objective:   Physical Exam     WD, WN, 78 y/o WM in NAD... GENERAL:  Alert & oriented; pleasant & cooperative... HEENT:  [place]/AT,   EACs-clear, TMs-wnl, NOSE-clear, THROAT-clear & wnl. NECK:  Supple w/ fairROM; no JVD; normal carotid impulses w/o bruits; no thyromegaly or nodules palpated; no lymphadenopathy. CHEST:  decr BS bilat, scat bibasilar rales, w/o wheezing/ rhonchi/ signs of consolidation. HEART:  irregular rhythm, gr1/6 diast murmur in Ao area, without  rubs or gallops detected... ABDOMEN:  Soft & nontender; normal bowel sounds; no organomegaly or masses palpated... URO: foley  EXT: s/p bilat TKR's, mod arthritic changes; no varicose veins/ +venous insuffic/ tr  edema. NEURO:   no focal neuro deficits... DERM:  No lesions noted; no rash etc...     Assessment:

## 2012-02-16 ENCOUNTER — Telehealth: Payer: Self-pay | Admitting: Pulmonary Disease

## 2012-02-16 NOTE — Telephone Encounter (Signed)
lmomtcb x1 for jennifer 

## 2012-02-20 NOTE — Telephone Encounter (Signed)
Called spoke with Victorino Dike, advised of SN's recs to decrease the cozaar and to follow up with Dr [name].  Victorino Dike read back the order and verbalized her understanding.  She did inform me that she had checked pt's meds after we last spoke and stated that pt has not taken any of his BP meds or his lasix this morning.  She stated that she told pt to hold these meds today.  Will forward to SN to make him aware.    Med list has been updated.

## 2012-02-20 NOTE — Telephone Encounter (Signed)
LMTCB x2  

## 2012-02-20 NOTE — Telephone Encounter (Addendum)
lmomtcb for jennifer---per SN---pt should at least take 1/2 of the dose of each BP med today

## 2012-02-20 NOTE — Telephone Encounter (Signed)
Called and spoke with pt and he stated that his son works until 6 and he will fix the pts meds once he gets off.  Waiting on jennifer to call back and we will make her aware that the pt needs to follow up with Dr. [name].

## 2012-02-20 NOTE — Telephone Encounter (Signed)
Called spoke with Victorino Dike nurse w/ [facility] who stated that pt's BP this morning was 80/50 in the left arm and 80/48 in the right arm.  Pt is not symptomatic per Victorino Dike.  Pt taking atenolol 25mg  daily, cozaar 100mg  daily and lasix 20mg  bid.  Pt is unsure what medications he has taken this morning.  Asked Victorino Dike how much longer she will be with patient this morning and she stated that she has been there "20 minutes already, so not long."  Dr Kriste Basque please advise, thanks!

## 2012-02-20 NOTE — Telephone Encounter (Signed)
[phone].  Victorino Dike from [facility] called [place] back and needs to speak to nurse.  She is with pt now.

## 2012-02-20 NOTE — Telephone Encounter (Signed)
Called and spoke with jennifer--she is aware that the pt will need to cut the BP meds in half today and will need to follow up with Dr. [name] for eval of low BP.  Victorino Dike is aware that the pt is aware.

## 2012-02-20 NOTE — Telephone Encounter (Signed)
Per SN---recs to decrease cozaar from 100mg   To 50mg   Daily and will need follow up with Dr. [name] for eval.  thanks

## 2012-02-21 ENCOUNTER — Telehealth: Payer: Self-pay | Admitting: Pulmonary Disease

## 2012-02-21 NOTE — Telephone Encounter (Signed)
I spoke with son and he states that pt took 100 mg of cozaar yesterday by mistake and his bp is still running low. He is requesting recs. I advised him per last phone note:Leigh Renaldo Fiddler, [facility] 02/20/2012 11:25 AM Signed  Per SN---recs to decrease cozaar from 100mg  To 50mg  Daily and will need follow up with Dr. [name] for eval. thanks  I advised him of this and he states he will call Dr. Fabio Bering office for an apt. Nothing further was needed

## 2012-02-22 ENCOUNTER — Ambulatory Visit (INDEPENDENT_AMBULATORY_CARE_PROVIDER_SITE_OTHER): Payer: Medicare Other | Admitting: Pharmacist

## 2012-02-22 ENCOUNTER — Telehealth: Payer: Self-pay | Admitting: Pulmonary Disease

## 2012-02-22 ENCOUNTER — Other Ambulatory Visit: Payer: Medicare Other

## 2012-02-22 DIAGNOSIS — I4891 Unspecified atrial fibrillation: Secondary | ICD-10-CM

## 2012-02-22 DIAGNOSIS — R197 Diarrhea, unspecified: Secondary | ICD-10-CM

## 2012-02-22 LAB — POCT INR: INR: 3.7

## 2012-02-22 MED ORDER — METRONIDAZOLE 250 MG PO TABS: 250.0000 mg | ORAL_TABLET | Freq: Four times a day (QID) | ORAL | Status: AC

## 2012-02-22 NOTE — Telephone Encounter (Signed)
I spoke with Ryan Hess and he states pt saw urology yesterday and had to have a cath placed. His urine had sign of bacteria in it. Ryan Hess stated pt had a couple loose bowels during his sleep last night and when he spoke with urology this am they told him to contact SN to have him checked for CDIFF colitis. Pt is going to have his pt/inr checked at 1 today and wants to come by and pick up whatever they may need when they are out. Please advise SN thanks

## 2012-02-22 NOTE — Telephone Encounter (Signed)
Per SN---get sample to check for c-diff----call in flagyl 250mg     #28  1 po qid with no refills. thanks

## 2012-02-22 NOTE — Telephone Encounter (Signed)
Ryan Hess is aware of sn recs. Rx has been called into the pharmacy

## 2012-02-23 ENCOUNTER — Telehealth: Payer: Self-pay | Admitting: Pulmonary Disease

## 2012-02-23 LAB — CLOSTRIDIUM DIFFICILE EIA: CDIFTX: NEGATIVE

## 2012-02-23 NOTE — Telephone Encounter (Signed)
LMTCB for pt 

## 2012-02-23 NOTE — Telephone Encounter (Signed)
I spoke with Ryan Hess and she was calling to let SN know that pt was started on 2 new medications yesterday--bactrim and flagyl. These 2 medication has a severity rating of 3 interaction w/ his coumadin and it can cause increase bleeding time. She stated she has to call and make SN ware of this. Advised will forward to Sn as an [place]

## 2012-02-23 NOTE — Telephone Encounter (Signed)
Per SN---have the pt get his protime checked on Monday at the coumadin clinic.  thanks

## 2012-02-26 NOTE — Telephone Encounter (Signed)
Called and spoke with coumadin clinic and they have scheduled appt for the pt at 12:15 on 02-27-2012.  Called and lmom for terry to make him aware.  Spoke with terry earlier and he stated that the diarrhea has stopped and pt is doing ok now.  Nothing further needed.

## 2012-02-27 ENCOUNTER — Ambulatory Visit (INDEPENDENT_AMBULATORY_CARE_PROVIDER_SITE_OTHER): Payer: Medicare Other | Admitting: *Deleted

## 2012-02-27 DIAGNOSIS — I4891 Unspecified atrial fibrillation: Secondary | ICD-10-CM

## 2012-02-27 LAB — POCT INR: INR: 3.4

## 2012-02-29 ENCOUNTER — Other Ambulatory Visit: Payer: Self-pay | Admitting: Urology

## 2012-02-29 ENCOUNTER — Ambulatory Visit (HOSPITAL_COMMUNITY)
Admission: RE | Admit: 2012-02-29 | Discharge: 2012-02-29 | Disposition: A | Discharge status: Home / Self Care | Payer: Medicare Other | Source: Ambulatory Visit | Attending: Urology | Admitting: Urology

## 2012-02-29 DIAGNOSIS — N390 Urinary tract infection, site not specified: Secondary | ICD-10-CM | POA: Insufficient documentation

## 2012-02-29 MED ORDER — LIDOCAINE HCL 1 % IJ SOLN
INTRAMUSCULAR | Status: AC
  Filled 2012-02-29: qty 20

## 2012-02-29 NOTE — Discharge Instructions (Signed)
Peripherally Inserted Central Catheter (PICC)  Home Guide  A peripherally inserted central catheter (PICC) is a long, thin, flexible tube that is inserted into a vein in the upper arm. It is a form of intravenous (IV) access. It is considered to be a "central" line because the tip of the PICC ends in a large vein in your chest. This large vein is called the superior vena cava (SVC). The PICC tip ends in the SVC because there is a lot of blood flow in the SVC. This allows medicines and IV fluids to be quickly distributed throughout the body. The PICC is inserted using a sterile technique by a specially trained nurse or physician. After the PICC is inserted, a chest X-ray is done to be sure it is in the correct place.   A PICC may be placed for different reasons, such as:   To give medicines and liquid nutrition that can only be given through a central line. Examples are:   Certain antibiotic treatments.   Chemotherapy.   Total parenteral nutrition (TPN).   To take frequent blood samples.   To give IV fluids and blood products.   If there is difficulty placing a peripheral intravenous (PIV) catheter.  If taken care of properly, a PICC can remain in place for several months. A PICC can also allow patients to go home early. Medicine and PICC care can be managed at home by a family member or home healthcare team.  RISKS AND COMPLICATIONS  Possible problems with a PICC can occasionally occur. This may include:   A clot (thrombus) forming in or at the tip of the PICC. This can cause the PICC to become clogged. A "clot-busting" medicine called tissue plasminogen activator (tPA) can be inserted into the PICC to help break up the clot.   Inflammation of the vein (phlebitis) in which the PICC is placed. Signs of inflammation may include redness, pain at the insertion site, red streaks, or being able to feel a "cord" in the vein where the PICC is located.   Infection in the PICC or at the insertion site. Signs of  infection may include fever, chills, redness, swelling, or pus drainage from the PICC insertion site.   PICC movement (malposition). The PICC tip may migrate from its original position due to excessive physical activity, forceful coughing, sneezing, or vomiting.   A break or cut in the PICC. It is important to not use scissors near the PICC.   Nerve or tendon irritation or injury during PICC insertion.  HOME CARE INSTRUCTIONS  Activity   You may bend your arm and move it freely. If your PICC is near or at the bend of your elbow, avoid activity with repeated motion at the elbow.   Avoid lifting heavy objects as instructed by your caregiver.   Avoid using a crutch with the arm on the same side as your PICC. You may need to use a walker.  PICC Dressing   Keep your PICC bandage (dressing) clean and dry to prevent infection.   Ask your caregiver when you may shower. Ask your caregiver to teach you how to wrap the PICC when you do take a shower.   Do not bathe, swim, or use hot tubs when you have a PICC.   Change the PICC dressing as instructed by your caregiver.   Change your PICC dressing if it becomes loose or wet.  General PICC Care   Check the PICC insertion site daily for leakage, redness, swelling,   or pain.   Flush the PICC as directed by your caregiver. Let your caregiver know right away if the PICC is difficult to flush or does not flush. Do not use force to flush the PICC.   Do not use a syringe that is less than 10 mLs to flush the PICC.   Never pull or tug on the PICC.   Avoid blood pressure checks on the arm with the PICC.   Keep your PICC identification card with you at all times.   Do not take the PICC out yourself. Only a trained clinical professional should remove the PICC.  SEEK IMMEDIATE MEDICAL CARE IF:   Your PICC is accidently pulled all the way out. If this happens, cover the insertion site with a bandage or gauze dressing. Do not throw the PICC away. Your caregiver will need to  inspect it.   Your PICC was tugged or pulled and has partially come out. Do not  push the PICC back in.   There is any type of drainage, redness, or swelling where the PICC enters the skin.   You cannot flush the PICC, it is difficult to flush, or the PICC leaks around the insertion site when it is flushed.   You hear a "flushing" sound when the PICC is flushed.   You have pain, discomfort, or numbness in your arm, shoulder, or jaw on the same side as the PICC .   You feel your heart "racing" or skipping beats.   You notice a hole or tear in the PICC.   You develop chills or a fever.  MAKE SURE YOU:    Understand these instructions.   Will watch your condition.   Will get help right away if you are not doing well or get worse.  Document Released: 03/18/2003 Document Revised: 08/31/2011 Document Reviewed: 01/16/2011  [employer] Patient Information 2012 [employer].

## 2012-02-29 NOTE — Procedures (Signed)
Successful US/Image guided placement of  SL PICC line to (R)brachial vein. Length 38cm, tip at SVC/RA  No complications Ready for immediate use.  Ryan Hess, KEVINPA-C 02/29/2012 1:16 PM

## 2012-03-07 ENCOUNTER — Ambulatory Visit: Payer: Medicare Other | Admitting: Cardiothoracic Surgery

## 2012-03-11 ENCOUNTER — Other Ambulatory Visit: Payer: Self-pay | Admitting: Pulmonary Disease

## 2012-03-11 ENCOUNTER — Ambulatory Visit (INDEPENDENT_AMBULATORY_CARE_PROVIDER_SITE_OTHER): Payer: Medicare Other | Admitting: Pharmacist

## 2012-03-11 DIAGNOSIS — I4891 Unspecified atrial fibrillation: Secondary | ICD-10-CM

## 2012-03-11 LAB — POCT INR: INR: 4.2

## 2012-03-18 ENCOUNTER — Encounter: Payer: Self-pay | Admitting: Cardiovascular Disease

## 2012-03-18 ENCOUNTER — Telehealth: Payer: Self-pay | Admitting: Pharmacist

## 2012-03-18 LAB — PROTIME-INR

## 2012-03-18 NOTE — Telephone Encounter (Signed)
Message copied by Velda Shell on Mon Mar 18, 2012 11:46 AM ------      Message from: Wendall Stade      Created: Mon Mar 18, 2012 11:22 AM       Ok to hold coumadin      ----- Message -----         From: Mariane Masters, [place]         Sent: 03/18/2012  10:57 AM           To: Wendall Stade, MD            [facility] called.  Pt has been treated with abx for UTI but is still having gross hematuria.  They want to know if they can hold his Coumadin for a few days until the bleeding resolves.  Thanks.

## 2012-03-18 NOTE — Telephone Encounter (Signed)
LM for Morrie Sheldon, [place] with [facility].  Okay to hold Coumadin per Dr. [name].  Left number for her to call with any questions.

## 2012-03-19 ENCOUNTER — Telehealth: Payer: Self-pay | Admitting: Pulmonary Disease

## 2012-03-19 NOTE — Telephone Encounter (Signed)
I spoke with Amber and she states pt passed out yesterday due to possible low pressure. Today his BP was 98/48 so he did not take his BP meds today. He usually take atenolol 25 mg QD and losartan 100mg  QD. Also she is wanting a verbal order to continue to monitor pt's BP. Pt also has blood in his urine but she has contacted Dr. Belva Crome office regarding this. She also wants to know if it is okay to take pt picc line out. She has also contacted Dr. Belva Crome office about this but also needs okay from SN. Please advise Dr. Kriste Basque, thanks  .

## 2012-03-19 NOTE — Telephone Encounter (Signed)
Per SN---cont to monitor the BP at home, ok to take the picc line out.   Pt will need ov with SN on Friday 6/28  at 2 pm.

## 2012-03-19 NOTE — Telephone Encounter (Signed)
Steward Drone called from [facility]---she stated that the pt has has gross hematuria with acute blood loss and they are having medical records faxed over to SN to review.  Wanted to let SN know this info on this pt.

## 2012-03-19 NOTE — Telephone Encounter (Signed)
Spoke with [facility] and notified of recs per SN. She verbalized understanding and states nothing further needed. Appt scheduled and she will advise the pt of this.

## 2012-03-22 ENCOUNTER — Encounter: Payer: Self-pay | Admitting: Pulmonary Disease

## 2012-03-22 ENCOUNTER — Ambulatory Visit (INDEPENDENT_AMBULATORY_CARE_PROVIDER_SITE_OTHER)
Admission: RE | Admit: 2012-03-22 | Discharge: 2012-03-22 | Disposition: A | Discharge status: Home / Self Care | Payer: Medicare Other | Source: Ambulatory Visit | Attending: Pulmonary Disease | Admitting: Pulmonary Disease

## 2012-03-22 ENCOUNTER — Ambulatory Visit (INDEPENDENT_AMBULATORY_CARE_PROVIDER_SITE_OTHER): Payer: Medicare Other | Admitting: Pulmonary Disease

## 2012-03-22 ENCOUNTER — Other Ambulatory Visit (INDEPENDENT_AMBULATORY_CARE_PROVIDER_SITE_OTHER): Payer: Medicare Other

## 2012-03-22 VITALS — BP 118/66 | HR 62 | Temp 96.9°F | Ht 69.0 in | Wt 176.8 lb

## 2012-03-22 DIAGNOSIS — Z79899 Other long term (current) drug therapy: Secondary | ICD-10-CM

## 2012-03-22 DIAGNOSIS — I10 Essential (primary) hypertension: Secondary | ICD-10-CM

## 2012-03-22 DIAGNOSIS — I4891 Unspecified atrial fibrillation: Secondary | ICD-10-CM

## 2012-03-22 DIAGNOSIS — I712 Thoracic aortic aneurysm, without rupture, unspecified: Secondary | ICD-10-CM

## 2012-03-22 DIAGNOSIS — R0600 Dyspnea, unspecified: Secondary | ICD-10-CM

## 2012-03-22 DIAGNOSIS — J189 Pneumonia, unspecified organism: Secondary | ICD-10-CM

## 2012-03-22 DIAGNOSIS — R339 Retention of urine, unspecified: Secondary | ICD-10-CM

## 2012-03-22 DIAGNOSIS — J4489 Other specified chronic obstructive pulmonary disease: Secondary | ICD-10-CM

## 2012-03-22 DIAGNOSIS — J449 Chronic obstructive pulmonary disease, unspecified: Secondary | ICD-10-CM

## 2012-03-22 DIAGNOSIS — R0989 Other specified symptoms and signs involving the circulatory and respiratory systems: Secondary | ICD-10-CM

## 2012-03-22 DIAGNOSIS — D500 Iron deficiency anemia secondary to blood loss (chronic): Secondary | ICD-10-CM

## 2012-03-22 DIAGNOSIS — I509 Heart failure, unspecified: Secondary | ICD-10-CM

## 2012-03-22 DIAGNOSIS — R319 Hematuria, unspecified: Secondary | ICD-10-CM

## 2012-03-22 DIAGNOSIS — K5900 Constipation, unspecified: Secondary | ICD-10-CM

## 2012-03-22 LAB — BASIC METABOLIC PANEL
BUN: 22 mg/dL (ref 6–23)
CO2: 27 mEq/L (ref 19–32)
Calcium: 9 mg/dL (ref 8.4–10.5)
Chloride: 100 mEq/L (ref 96–112)
Creatinine, Ser: 1.4 mg/dL (ref 0.4–1.5)
GFR: 51.02 mL/min — ABNORMAL LOW (ref >60.00)
Glucose, Bld: 92 mg/dL (ref 70–99)
Potassium: 4.7 mEq/L (ref 3.5–5.1)
Sodium: 136 mEq/L (ref 135–145)

## 2012-03-22 LAB — PROTIME-INR
INR: 1.2 ratio — ABNORMAL HIGH (ref 0.8–1.0)
Prothrombin Time: 13.6 s — ABNORMAL HIGH (ref 10.2–12.4)

## 2012-03-22 LAB — CBC WITH DIFFERENTIAL/PLATELET
Basophils Absolute: 0.1 10*3/uL (ref 0.0–0.1)
Basophils Relative: 0.9 % (ref 0.0–3.0)
Eosinophils Absolute: 0.1 10*3/uL (ref 0.0–0.7)
Eosinophils Relative: 2.3 % (ref 0.0–5.0)
HCT: 25.3 % — ABNORMAL LOW (ref 39.0–52.0)
Hemoglobin: 8.4 g/dL — ABNORMAL LOW (ref 13.0–17.0)
Lymphocytes Relative: 12.8 % (ref 12.0–46.0)
Lymphs Abs: 0.8 10*3/uL (ref 0.7–4.0)
MCHC: 33 g/dL (ref 30.0–36.0)
MCV: 93.4 fl (ref 78.0–100.0)
Monocytes Absolute: 0.8 10*3/uL (ref 0.1–1.0)
Monocytes Relative: 13 % — ABNORMAL HIGH (ref 3.0–12.0)
Neutro Abs: 4.5 10*3/uL (ref 1.4–7.7)
Neutrophils Relative %: 71 % (ref 43.0–77.0)
Platelets: 271 10*3/uL (ref 150.0–400.0)
RBC: 2.71 Mil/uL — ABNORMAL LOW (ref 4.22–5.81)
RDW: 15.9 % — ABNORMAL HIGH (ref 11.5–14.6)
WBC: 6.4 10*3/uL (ref 4.5–10.5)

## 2012-03-22 LAB — VITAMIN B12: Vitamin B-12: 953 pg/mL — ABNORMAL HIGH (ref 211–911)

## 2012-03-22 LAB — IBC PANEL
Iron: 31 ug/dL — ABNORMAL LOW (ref 42–165)
Saturation Ratios: 11.3 % — ABNORMAL LOW (ref 20.0–50.0)
Transferrin: 195.1 mg/dL — ABNORMAL LOW (ref 212.0–360.0)

## 2012-03-22 LAB — TSH: TSH: 6.66 u[IU]/mL — ABNORMAL HIGH (ref 0.35–5.50)

## 2012-03-22 LAB — BRAIN NATRIURETIC PEPTIDE: Pro B Natriuretic peptide (BNP): 552 pg/mL — ABNORMAL HIGH (ref 0.0–100.0)

## 2012-03-22 NOTE — Progress Notes (Signed)
Subjective:     Patient ID: Ryan Hess, male   DOB: Aug 03, 1924, 78 y.o.   MRN: [id number]  HPI 78 y/o WM here for a follow up visit... he has multiple medical problems as noted below...    ~  August 28, 2011:  13 month ROV & he is remarkably w/o complaints at 78 y/o w/ multisystem disease... BP well controlled, denies CP/ palpit/ ch in SOB, edema; LABS show FLP looks good on Lip10, BMet wnl on Lasix20+K20, CBC ok & TSH borderline (we are following)...    He saw Cherly Hensen 9/12 for f/u HBP, CAD, chr AFib on Coumadin, ASPVD> doing satis, cough resolved off Lisinopril (ch to [place]), stable on meds & Coumadin- no changes made...    He saw DrGearhardt 11/12 for f/u thoraco-abdominal aneurysm, no surg due to age & comorbidities> f/u CT scan 12/12 showed severe emphysema & scarring w/ bibasilar atx (no infiltrates no cavities), markedly tortuous & ectatic Thor Ao w/ extensive atherosclerotic changes & no change in decr thor aneurysm measuring 5.9 x 5.6cm, mild cardiomeg & dense coronary calcif as well...    He saw DrDBrodie 8/12 for f/u esoph dysmotility & hx colon polyps > known presbyesoph, no stricture, denies swallowing prob; he had neg colonoscopy 2006 (w/o recurrent polyps, +divertics/ hems); no f/u colon needed due to age & comorbidities...    He saw DrWrenn 8/12 for f/u prostate cancer dx in 2009 & treated w/ XRT completed 6/09; slowly rising PSA w/ doubling time 6-85mo min voiding symptoms, he is considering androgen ablation if needed...  ~  March 22, 2012:  715moOV & GeMikhaias been through a lot >> In Feb2013 he [facility]- starting 5cm distal to the left subclavian down to the prox aspect of there prev [name] graft;  They chose to proceed w/ a fenestrated endovasc repair> done 01/01/12;  Disch on Coumadin for his AFib & Lovenox bridge but he developed a complic w/ lumbar epidural hematomas w/ paraplegia  requiring readmission for laminectomy & evac of the hematomas;  [facility]/ altered mental status, pneumonia, AFib w/ rvr, & FTT> Neuro w/u was otherw neg (& he improved w/ supportive care); CXR showed L>R bibasilar airsp dis (covered w/ IV antibiotics & disch on Avelox); AFib was rare controlled w/ med adjustments & Coumadin continued; Urinary retension required foley cath & Urology outpt management; his protimes got too thin & he developed hematuria & acute blood loss anemia; now BP has been low necessitating a HOLD on his BP med...  Thoracoabd aneurysm treated w/ fenestrated stent graft [facility] 4/13 [facility] >  Lumbar epidural hematomas w/ paraparesis requiring [facility]aminectomy 4/13 [facility] > ambulating w/ walker & gaining strength...  Bilat lower lobe pneumonias treated in-patient 5/13 and disch on Avelox > He is afeb & starting to feel better, actually looks pretty good (just pale); CXR today showed resolution of infiltrates...  AFib w/ RVR> meds adjusted and Coumadin carefully monitored; Heart rate= 62 off Atenolol; Protime=13.6/1.2 Off Coumadin x several days now; decision will need to be made regarding restart coumadin when safe...  Urinary retension from the lumbar surg and hematuria developing while on coumadin > he  has foley cath & leg bag w/ clear yellow urine at present; appt to see DrWrenn in 3d to consider further options (?voiding trial, in&out cath, etc)...  Hypotension w/ weakness & required down titration in BP meds & careful monitoring of BP etc > currently on Lasix40, K10Bid; BP= 118/66 and has varied 90's/70's to 120's/80's at home OFF his prev Aten25 & Losartan50;  Chem's are WNL & BNP=552...  Anemia > He developed acute blood loss anemia w/ post op Hg= 11-12 but dropped to  8.6 w/ hematuria & INR=4.2; Coumadin & ASA placed on HOLD; f/u labs showed Hg=8.4, Fe=31 (11%sat);  Rec to start FeSO4 Bid now...  Complaints of constipation > we reviewed the need for MIRALAX 17gm in water daily, and SENAKOT-S 2tabs at bedtime...  We reviewed prob list, meds, xrays and labs> CXR 6/13 showed stable cardiac silhouette, sl incr markings chronically, NAD, no edema, Ao stent in desc Ao appears stable... LABS 6/13:  Chems- ok w/ BUN=22 Creat=1.4 BNP=552;  CBC- Hg=8.4 Fe=31(11%sat) B12=953;  Protime=13.6/ INR1.2;  TSH=6.66          Problem List:  COPD (ICD-496) Hx of PNEUMONIA, ORGANISM UNSPECIFIED (ICD-486) PULMONARY NODULE & CAVITY RLL (ICD-518.89) - he is an ex-smoker, having quit in 1990 after 40 yrs of smoking... he was exercising regularly and walking daily ~52m 5-6 days per week... on AFalmouthdaily, +MUCINEX 2Bid w/ Fluids... ~  baseline CXR & CTChest w/ marked emphysema, atheromatous calcif, 5cm desc thorAA...  ~  CXR & CTA 2/11 in hosp showed cardiomeg + coronary calcif, advanced atherosclerosis of Ao w/ aneuryms 5.8cm into abd, underlying emphysema w/ interst edema & bilat effusions, no PE... ~  4/11:  RLL pneumonia which was slow to clear... ~  6/11:  f/u CXR & CT Chest 6/11 w/ 6cm cavitary area RLL & nodular component inferiorly, +hilar adenopathy, no mediastinal nodes, etc; Tumor markers showed CEA=7.5 & Ca19-9= 10.8; > IR felt lesion was too hi risk for bx, therefore contin Rx & observe. ~  8/11:  f/u CXR w/o change in RLL opac (no worsening)>> repeat CEA=6.5; plan f/u CT Chest in 286mo~  10/11: f/u CT Chest showed interval decrease in size of RLL cavitary lesion & the inferiorly placed soft tissue component; otherw there is severe emphysema, cardiomeg, coronary calcif, thoracoabd aneurysm w/o change; there was an air-fluid level in a dilated esoph> check Ba Esophagram= Nonspecific esophageal motility disorder with very poor primary esophageal contractions  (no HH, stricture, etc)... ~  12/12: f/u CTChest showed severe emphysema & scarring w/ bibasilar atx (no infiltrates no cavities), markedly tortuous & ectaticThorAo w/ extensive atherosclerotic changes & no change in decr thor aneurysm measuring 5.9 x 5.6cm, mild cardiomeg & dense coronary calcif as well...   HYPERTENSION (ICD-401.9)                         << followed by DrCherly Hensenor Cards >> CONGESTIVE HEART FAILURE (ICD-428.0) ATRIAL FIBRILLATION (ICD-427.31) w/ clot in left atrial appendage >> resolved on f/u TEE, on COUMADIN per Cards & cardioverted. PREMATURE VENTRICULAR CONTRACTIONS, FREQUENT (ICD-427.69) - prev on ATENOLOL '50mg'$ /d, LOSARTAN '100mg'$ /d, & LASIX '20mg'$ -2AM + KCl 1082md (prev Norvasc discontinued)... hx ACE cough in the past on Lisinopril...  ~  NuclearStressTest 1/05 was neg- no ischemia or infarct (+diaphrag attenuation), EF=56%... ~  repeat Nuclear study 6/09 was neg- no ischemia, mild inferoapic thinning, not gated due to PVCs... ~  2DEcho 6/09 showed mild  dilated LV w/ EF= 45-50% but no regional wall motion abn, mild AoV calcif & AI, LA mild dil... ~  12/10:  changed from [place] to [place] to save $$ ~  2/11:  in hosp- Cath= mild nonobstructive 3 vessel CAD, mod LVD w/ EF=40-45%; TEE= CHF w/ EF~45% w/ HK & left atrial appendage clot & AFib; he was diuresed & Norvasc stopped, placed on Coumadin w/ careful f/u by Cherly Hensen & the [facility]... ~  Subseq successful [facility] & holding NSR w/ PVCs... ~  Recurrent AFib w/ RVR after his Thoracoabdominal stent graft 4/13...  PERIPHERAL VASCULAR DISEASE (ICD-443.9) - prev on ASA '81mg'$ /d... he is s/p infrarenal AAA repair w/ right common iliac aneurysm repair (via Ao Bi-iliac graft) in 1998 by DrLawson... also has a known desc thor AA measuring ~5+cm and followed by DrGearhardt periodically... ~  seen by DrGearhart 4/10 w/ CT scan showing ~5cm thoracoabdominal aneurysm w/o signif change...  ~  seen by DrGearhart 2/11 in hosp w/ sl incr size of AAA-  f/u planned 85mo ~  also followed by [facility]- DrCooper. ~  seen by DrGearhardt 10/11 & stable, no change, f/u 1 yr. ~  Seen 12/12 by DrGearhardt & CTscan is stable, continue conservative approach... ~  2/13:  [facility]...  HYPERCHOLESTEROLEMIA (ICD-272.0) - on LIPITOR '10mg'$ /d...  ~  FWilliamson5/07 showed TChol 115, Tg 58, HDL 36, LDL 67 ~  FLP 5/08 showed TChol 118, TG 66, HDL 31, LDL 74 ~  FLP 5/09 showed TChol 126, TG 71, HDL 37, LDL 75 ~  FLP 6/10 showed TChol 116, TG 73, HDL 40, LDL 62 ~  2/11:  FLP not checked during the hospitalization... ~  FLP 4/11 in hosp showed TChol 94, TG 52, HDL 23, LDL 61 ~  FLP 12/12 on Lip10 showed TChol 107, TG 38, HDL 40, LDL 60  DIVERTICULOSIS OF COLON (ICD-562.10) & COLONIC POLYPS (ICD-211.3) - hx polyps in 2003 = tubular adenoma... ~  last colonoscopy 9/06 showed divertics, hems, no polyps.  PROSTATE CANCER (ICD-185) - eval by DrWrenn and they decided on XRT by DrKinard- finished 5/09 and he states that DrKinard "released me"... he sees DrWrenn every 6 months & they follow PSA closely (notes reviewed)... ~  8/12: f/u prostate cancer dx in 2009 & treated w/ XRT completed 6/09; slowly rising PSA w/ doubling time 6-984momin voiding symptoms, he is considering androgen ablation if needed...  DEGENERATIVE JOINT DISEASE (ICD-715.90) - s/p bilat TKR's, [facility]- DrOlin...  ACTINIC SKIN DAMAGE (ICD-692.70) - followed by DrBrunetta Jeanshe knows to avoid sun exposure, use sun screen, etc...   Past Surgical History  Procedure Date  . Total knee arthroplasty     bilateral  . Abdominal aortic aneurysm repair 1998  . Appendectomy   . Inguinal hernia repair     left  . Joint replacement     Outpatient Encounter Prescriptions as of 03/22/2012  Medication Sig Dispense Refill  . ADVAIR DISKUS 250-50 MCG/DOSE AEPB Inhale 1 puff into the lungs Twice daily.      . Marland Kitchenspirin 81 MG tablet    << HOLD >>       . atenolol (TENORMIN) 25  MG tablet    << HOLD >>  30 tablet  11  . atorvastatin (LIPITOR) 10 MG tablet Take 1 tablet (10 mg total) by mouth daily.  90 tablet  0  . furosemide (LASIX) 40 MG tablet Take 40 mg by mouth daily.      .[name]  guaiFENesin (MUCINEX) 600 MG 12 hr tablet Take 600 mg by mouth 2 (two) times daily. COPD.      [name] losartan (COZAAR) 100 MG tablet    << HOLD >>      . Multiple Vitamin (MULTIVITAMIN) tablet Take 1 tablet by mouth daily.        . potassium chloride (K-DUR) 10 MEQ tablet Take 1 tablet (10 mEq total) by mouth 2 (two) times daily.  180 tablet  0  . SPIRIVA HANDIHALER 18 MCG inhalation capsule INHALE THE CONTENTS OF 1 CAPSULE BY HANDIHALER DAILY  1 capsule  3  . Tamsulosin HCl (FLOMAX) 0.4 MG CAPS Take 0.8 mg by mouth at bedtime.      [name] warfarin (COUMADIN) 1 MG tablet    << HOLD >>  3 tablet  0    No Known Allergies   Current Medications, Allergies, Past Medical History, Past Surgical History, Family History, and Social History were reviewed in [employer] record.   Review of Systems        See HPI - all other systems neg except as noted...  The patient complains of dyspnea on exertion.  The patient denies anorexia, fever, weight loss, weight gain, vision loss, decreased hearing, hoarseness, chest pain, syncope, peripheral edema, prolonged cough, headaches, hemoptysis, abdominal pain, melena, hematochezia, severe indigestion/heartburn, hematuria, incontinence, muscle weakness, suspicious skin lesions, transient blindness, difficulty walking, depression, unusual weight change, abnormal bleeding, enlarged lymph nodes, and angioedema.     Objective:   Physical Exam    WD, WN, 78 y/o WM in NAD... GENERAL:  Alert & oriented; pleasant & cooperative; pale complexion... HEENT:  [place]/AT, EOM-wnl, PERRLA, EACs-clear, TMs-wnl, NOSE-clear, THROAT-clear & wnl. NECK:  Supple w/ fairROM; no JVD; normal carotid impulses w/o bruits; no thyromegaly or nodules palpated; no  lymphadenopathy. CHEST:  decr BS bilat, scat bibasilar rales, w/o wheezing/ rhonchi/ signs of consolidation. HEART:  irregular rhythm, gr1/6 diast murmur in Ao area, without rubs or gallops detected... ABDOMEN:  Soft & nontender; normal bowel sounds; no organomegaly or masses palpated... EXT: s/p bilat TKR's, mod arthritic changes; no varicose veins/ +venous insuffic/ no edema. NEURO:  CN's intact;  gait abn; no focal neuro deficits... DERM:  No lesions noted; no rash etc...  RADIOLOGY DATA:  Reviewed in the EPIC EMR & discussed w/ the patient...  LABORATORY DATA:  Reviewed in the EPIC EMR & discussed w/ the patient...   Assessment:     COPD, Hx Pneumonia>  Prev CXR/CT abn has resolved back to baseline severe COPD/Emphysema; then developed bilat lower lobe airsp dis & today's CXR shows resolution of the pneumonias...  HBP>  Prev controlled on Aten50, Lasix20, Losartan100, K10; He called w/ weakness & hypotension- doses were 1st cut back, then meds held, & he is improved...  CAD/ CHF/ etc>  Followed by Cherly Hensen & his notes are reviewed...  AFib>  On above + Coumadin followed in the [facility] but on hold now w/ hematuria & anemia; this will need to be restarted later...  Periph Vasc Dis>  S/p AAA repair 1998 by Sheryn Bison; then followed by DrGearhardt for thoraco-abd aneurysm & referred to [facility] for stent graft surg- done 4/13 by DrFarber...  CHOL>  FLP looks good on Lip10...  GI> Presbyesoph, Divertics, Hx polpyps>  Stable & followed by DrDBrodie...  Prostate Cancer>  Followed by DrWrenn  w/ slowly rising PSA as noted; then developed urinary retention after lumbar surg and hematuria after foley & coumadin; now urine has cleared off  coumadin & he has f/u appt w/ DrWrenn in 3d to decide on further course...  DJD>  Followed by DrOlin, s/p bilat TKRs...  ANEMIA>  He developed acute blood loss anemia w/ post op Hg= 11-12 but dropped to 8.6 w/ hematuria & INR=4.2; Coumadin & ASA placed on HOLD; f/u  labs showed Hg=8.4 & Fe=31 so he will start FeSO4 Bid...  Actinic skin changes>  Aware, he is monitored by Derm...     Plan:     Patient's Medications  New Prescriptions   No medications on file  Previous Medications   ADVAIR DISKUS 250-50 MCG/DOSE AEPB    Inhale 1 puff into the lungs Twice daily.   ASPIRIN 81 MG TABLET       << HOLD >>   ATENOLOL (TENORMIN) 25 MG TABLET       << HOLD >>   ATORVASTATIN (LIPITOR) 10 MG TABLET    Take 1 tablet (10 mg total) by mouth daily.   FERROUS SULFATE 325 (65 FE) MG TABLET    Take 325 mg by mouth 2 (two) times daily.   FUROSEMIDE (LASIX) 40 MG TABLET    Take 40 mg by mouth daily.   GUAIFENESIN (MUCINEX) 600 MG 12 HR TABLET    Take 600 mg by mouth 2 (two) times daily. COPD.   LOSARTAN (COZAAR) 100 MG TABLET       << HOLD >>   MULTIPLE VITAMIN (MULTIVITAMIN) TABLET    Take 1 tablet by mouth daily.     POLYETHYLENE GLYCOL (MIRALAX / GLYCOLAX) PACKET    Take 17 g by mouth daily. Mixed in water   POTASSIUM CHLORIDE (K-DUR) 10 MEQ TABLET    Take 1 tablet (10 mEq total) by mouth 2 (two) times daily.   SENNA-DOCUSATE (SENOKOT S) 8.6-50 MG PER TABLET    Take 2 tablets by mouth at bedtime.   SPIRIVA HANDIHALER 18 MCG INHALATION CAPSULE    INHALE THE CONTENTS OF 1 CAPSULE BY HANDIHALER DAILY   TAMSULOSIN HCL (FLOMAX) 0.4 MG CAPS    Take 0.8 mg by mouth at bedtime.   WARFARIN (COUMADIN) 1 MG TABLET       << HOLD >>  Modified Medications   No medications on file  Discontinued Medications   No medications on file

## 2012-03-22 NOTE — Patient Instructions (Addendum)
Today we updated your med list in our EPIC system...     Your Aspirin & Coumadin are on HOLD for now...  Your Atenolol & Losartan are on hold for now...  Continue your Lasix & avoid sodium/ salt in your diet...  For your ANEMIA:    Start FeSO4 (Ferrous sulfate) ~325mg  tabs one tab twice daily w/ a meal...    (This will turn your stools black)  For your Constipation:    Start MIRALAX OTC- one capful in water daily...    And SENAKOT-S 2 tabs orally at bedtime...    If you are having trouble going> use a suppository: Dulcolax or Glycerin (both avail OTC)...  Today we dis your follow up CXR & blood work...    We will call you w/ the reports 1st of the week...  Let's plan a recheck in 6 weeks.Marland KitchenMarland Kitchen

## 2012-03-25 ENCOUNTER — Telehealth: Payer: Self-pay | Admitting: Pulmonary Disease

## 2012-03-25 NOTE — Telephone Encounter (Signed)
Ryan Hess did you call this patient? Please advise.Ryan Hess, CMA

## 2012-03-26 NOTE — Telephone Encounter (Signed)
Called and spoke with Ryan Hess about the pts lab and cxr results.  Ryan Hess voiced his understanding of these results. Nothing further was needed.

## 2012-03-27 ENCOUNTER — Encounter: Payer: Self-pay | Admitting: Cardiovascular Disease

## 2012-03-29 ENCOUNTER — Other Ambulatory Visit: Payer: Self-pay | Admitting: Pulmonary Disease

## 2012-03-29 MED ORDER — TIOTROPIUM BROMIDE MONOHYDRATE 18 MCG IN CAPS: 18.0000 ug | ORAL_CAPSULE | Freq: Every day | RESPIRATORY_TRACT | Status: DC

## 2012-04-16 ENCOUNTER — Encounter (HOSPITAL_COMMUNITY): Payer: Self-pay | Admitting: Anesthesiology

## 2012-04-16 ENCOUNTER — Encounter (HOSPITAL_COMMUNITY): Payer: Self-pay | Admitting: Emergency Medicine

## 2012-04-16 ENCOUNTER — Encounter (HOSPITAL_COMMUNITY): Admission: EM | Disposition: A | Discharge status: Home / Self Care | Payer: Self-pay | Source: Home / Self Care

## 2012-04-16 ENCOUNTER — Inpatient Hospital Stay (HOSPITAL_COMMUNITY)
Admission: EM | Admit: 2012-04-16 | Discharge: 2012-04-25 | DRG: 329 | Disposition: A | Discharge status: Home / Self Care | Payer: Medicare Other | Attending: General Surgery | Admitting: General Surgery

## 2012-04-16 ENCOUNTER — Inpatient Hospital Stay (HOSPITAL_COMMUNITY): Payer: Medicare Other | Admitting: Anesthesiology

## 2012-04-16 ENCOUNTER — Telehealth: Payer: Self-pay | Admitting: Pulmonary Disease

## 2012-04-16 ENCOUNTER — Emergency Department (HOSPITAL_COMMUNITY): Payer: Medicare Other

## 2012-04-16 DIAGNOSIS — I714 Abdominal aortic aneurysm, without rupture, unspecified: Secondary | ICD-10-CM

## 2012-04-16 DIAGNOSIS — J449 Chronic obstructive pulmonary disease, unspecified: Secondary | ICD-10-CM

## 2012-04-16 DIAGNOSIS — J95821 Acute postprocedural respiratory failure: Secondary | ICD-10-CM

## 2012-04-16 DIAGNOSIS — K560 Paralytic ileus: Secondary | ICD-10-CM | POA: Diagnosis not present

## 2012-04-16 DIAGNOSIS — K458 Other specified abdominal hernia without obstruction or gangrene: Secondary | ICD-10-CM | POA: Diagnosis present

## 2012-04-16 DIAGNOSIS — K559 Vascular disorder of intestine, unspecified: Principal | ICD-10-CM | POA: Diagnosis present

## 2012-04-16 DIAGNOSIS — I5022 Chronic systolic (congestive) heart failure: Secondary | ICD-10-CM | POA: Diagnosis present

## 2012-04-16 DIAGNOSIS — I509 Heart failure, unspecified: Secondary | ICD-10-CM

## 2012-04-16 DIAGNOSIS — E7800 Pure hypercholesterolemia, unspecified: Secondary | ICD-10-CM | POA: Diagnosis present

## 2012-04-16 DIAGNOSIS — K56609 Unspecified intestinal obstruction, unspecified as to partial versus complete obstruction: Secondary | ICD-10-CM

## 2012-04-16 DIAGNOSIS — I4891 Unspecified atrial fibrillation: Secondary | ICD-10-CM

## 2012-04-16 DIAGNOSIS — J4489 Other specified chronic obstructive pulmonary disease: Secondary | ICD-10-CM

## 2012-04-16 DIAGNOSIS — Z8546 Personal history of malignant neoplasm of prostate: Secondary | ICD-10-CM

## 2012-04-16 DIAGNOSIS — G9340 Encephalopathy, unspecified: Secondary | ICD-10-CM

## 2012-04-16 DIAGNOSIS — R109 Unspecified abdominal pain: Secondary | ICD-10-CM

## 2012-04-16 DIAGNOSIS — Z87891 Personal history of nicotine dependence: Secondary | ICD-10-CM

## 2012-04-16 DIAGNOSIS — K55059 Acute (reversible) ischemia of intestine, part and extent unspecified: Secondary | ICD-10-CM

## 2012-04-16 DIAGNOSIS — Z96659 Presence of unspecified artificial knee joint: Secondary | ICD-10-CM

## 2012-04-16 DIAGNOSIS — S31109A Unspecified open wound of abdominal wall, unspecified quadrant without penetration into peritoneal cavity, initial encounter: Secondary | ICD-10-CM

## 2012-04-16 HISTORY — PX: LAPAROTOMY: SHX154

## 2012-04-16 LAB — URINALYSIS, ROUTINE W REFLEX MICROSCOPIC
Bilirubin Urine: NEGATIVE
Glucose, UA: NEGATIVE mg/dL
Hgb urine dipstick: NEGATIVE
Ketones, ur: NEGATIVE mg/dL
Nitrite: NEGATIVE
Protein, ur: NEGATIVE mg/dL
Specific Gravity, Urine: 1.018 (ref 1.005–1.030)
Urobilinogen, UA: 0.2 mg/dL (ref 0.0–1.0)
pH: 5.5 (ref 5.0–8.0)

## 2012-04-16 LAB — CBC WITH DIFFERENTIAL/PLATELET
Basophils Absolute: 0 10*3/uL (ref 0.0–0.1)
Basophils Relative: 0 % (ref 0–1)
Eosinophils Absolute: 0 10*3/uL (ref 0.0–0.7)
Eosinophils Relative: 0 % (ref 0–5)
HCT: 39.7 % (ref 39.0–52.0)
Hemoglobin: 12.6 g/dL — ABNORMAL LOW (ref 13.0–17.0)
Lymphocytes Relative: 7 % — ABNORMAL LOW (ref 12–46)
Lymphs Abs: 0.5 10*3/uL — ABNORMAL LOW (ref 0.7–4.0)
MCH: 30.6 pg (ref 26.0–34.0)
MCHC: 31.7 g/dL (ref 30.0–36.0)
MCV: 96.4 fL (ref 78.0–100.0)
Monocytes Absolute: 0.4 10*3/uL (ref 0.1–1.0)
Monocytes Relative: 6 % (ref 3–12)
Neutro Abs: 6.9 10*3/uL (ref 1.7–7.7)
Neutrophils Relative %: 87 % — ABNORMAL HIGH (ref 43–77)
Platelets: 159 10*3/uL (ref 150–400)
RBC: 4.12 MIL/uL — ABNORMAL LOW (ref 4.22–5.81)
RDW: 17.1 % — ABNORMAL HIGH (ref 11.5–15.5)
WBC: 7.9 10*3/uL (ref 4.0–10.5)

## 2012-04-16 LAB — TYPE AND SCREEN
ABO/RH(D): O POS
Antibody Screen: NEGATIVE

## 2012-04-16 LAB — COMPREHENSIVE METABOLIC PANEL
ALT: 14 U/L (ref 0–53)
AST: 27 U/L (ref 0–37)
Albumin: 3.6 g/dL (ref 3.5–5.2)
Alkaline Phosphatase: 141 U/L — ABNORMAL HIGH (ref 39–117)
BUN: 18 mg/dL (ref 6–23)
CO2: 23 mEq/L (ref 19–32)
Calcium: 9.1 mg/dL (ref 8.4–10.5)
Chloride: 104 mEq/L (ref 96–112)
Creatinine, Ser: 1.11 mg/dL (ref 0.50–1.35)
GFR calc Af Amer: 67 mL/min — ABNORMAL LOW (ref >90)
GFR calc non Af Amer: 58 mL/min — ABNORMAL LOW (ref >90)
Glucose, Bld: 131 mg/dL — ABNORMAL HIGH (ref 70–99)
Potassium: 3.9 mEq/L (ref 3.5–5.1)
Sodium: 141 mEq/L (ref 135–145)
Total Bilirubin: 1.6 mg/dL — ABNORMAL HIGH (ref 0.3–1.2)
Total Protein: 6.6 g/dL (ref 6.0–8.3)

## 2012-04-16 LAB — APTT: aPTT: 31 seconds (ref 24–37)

## 2012-04-16 LAB — LIPASE, BLOOD: Lipase: 14 U/L (ref 11–59)

## 2012-04-16 LAB — LACTIC ACID, PLASMA: Lactic Acid, Venous: 1.8 mmol/L (ref 0.5–2.2)

## 2012-04-16 LAB — URINE MICROSCOPIC-ADD ON

## 2012-04-16 LAB — PROTIME-INR
INR: 1.24 (ref 0.00–1.49)
Prothrombin Time: 15.9 seconds — ABNORMAL HIGH (ref 11.6–15.2)

## 2012-04-16 LAB — ABO/RH: ABO/RH(D): O POS

## 2012-04-16 SURGERY — LAPAROTOMY, EXPLORATORY
Anesthesia: General | Site: Abdomen | Wound class: Clean Contaminated

## 2012-04-16 MED ORDER — HYDROMORPHONE HCL PF 2 MG/ML IJ SOLN
2.0000 mg | Freq: Once | INTRAMUSCULAR | Status: AC
  Administered 2012-04-16: 2 mg via INTRAVENOUS

## 2012-04-16 MED ORDER — ONDANSETRON HCL 4 MG/2ML IJ SOLN
4.0000 mg | Freq: Once | INTRAMUSCULAR | Status: AC
  Administered 2012-04-16: 4 mg via INTRAVENOUS
  Filled 2012-04-16: qty 2

## 2012-04-16 MED ORDER — IOHEXOL 300 MG/ML  SOLN
80.0000 mL | Freq: Once | INTRAMUSCULAR | Status: AC | PRN
  Administered 2012-04-16: 80 mL via INTRAVENOUS

## 2012-04-16 MED ORDER — ONDANSETRON HCL 4 MG/2ML IJ SOLN
INTRAMUSCULAR | Status: AC
  Administered 2012-04-16: 4 mg via INTRAVENOUS
  Filled 2012-04-16: qty 2

## 2012-04-16 MED ORDER — HYDROMORPHONE HCL PF 1 MG/ML IJ SOLN
0.5000 mg | Freq: Once | INTRAMUSCULAR | Status: AC
  Administered 2012-04-16: 0.5 mg via INTRAVENOUS
  Filled 2012-04-16: qty 1

## 2012-04-16 MED ORDER — IOHEXOL 300 MG/ML  SOLN
20.0000 mL | INTRAMUSCULAR | Status: AC
  Administered 2012-04-16 (×2): 20 mL via ORAL

## 2012-04-16 MED ORDER — SODIUM CHLORIDE 0.9 % IV BOLUS (SEPSIS)
500.0000 mL | Freq: Once | INTRAVENOUS | Status: AC
  Administered 2012-04-16: 500 mL via INTRAVENOUS

## 2012-04-16 MED ORDER — HYDROMORPHONE HCL PF 2 MG/ML IJ SOLN
INTRAMUSCULAR | Status: AC
  Filled 2012-04-16: qty 1

## 2012-04-16 MED ORDER — SODIUM CHLORIDE 0.9 % IV SOLN
1.0000 g | Freq: Once | INTRAVENOUS | Status: DC
  Filled 2012-04-16: qty 1

## 2012-04-16 SURGICAL SUPPLY — 46 items
BLADE SURG ROTATE 9660 (MISCELLANEOUS) ×1 IMPLANT
CANISTER SUCTION 2500CC (MISCELLANEOUS) ×2 IMPLANT
CHLORAPREP W/TINT 26ML (MISCELLANEOUS) ×2 IMPLANT
CLOTH BEACON ORANGE TIMEOUT ST (SAFETY) ×2 IMPLANT
COVER SURGICAL LIGHT HANDLE (MISCELLANEOUS) ×2 IMPLANT
DRAPE LAPAROSCOPIC ABDOMINAL (DRAPES) ×2 IMPLANT
DRAPE WARM FLUID 44X44 (DRAPE) ×2 IMPLANT
DRSG VAC ATS LRG SENSATRAC (GAUZE/BANDAGES/DRESSINGS) ×1 IMPLANT
ELECT BLADE 6.5 EXT (BLADE) IMPLANT
ELECT REM PT RETURN 9FT ADLT (ELECTROSURGICAL) ×2
ELECTRODE REM PT RTRN 9FT ADLT (ELECTROSURGICAL) ×1 IMPLANT
GLOVE SURG SS PI 7.5 STRL IVOR (GLOVE) ×4 IMPLANT
GOWN PREVENTION PLUS XLARGE (GOWN DISPOSABLE) ×2 IMPLANT
GOWN STRL NON-REIN LRG LVL3 (GOWN DISPOSABLE) ×4 IMPLANT
KIT BASIN OR (CUSTOM PROCEDURE TRAY) ×2 IMPLANT
KIT ROOM TURNOVER OR (KITS) ×2 IMPLANT
LIGASURE IMPACT 36 18CM CVD LR (INSTRUMENTS) ×1 IMPLANT
NS IRRIG 1000ML POUR BTL (IV SOLUTION) ×4 IMPLANT
PACK GENERAL/GYN (CUSTOM PROCEDURE TRAY) ×2 IMPLANT
PAD ARMBOARD 7.5X6 YLW CONV (MISCELLANEOUS) ×2 IMPLANT
PAD NEG PRESSURE SENSATRAC (MISCELLANEOUS) ×1 IMPLANT
RELOAD PROXIMATE 75MM BLUE (ENDOMECHANICALS) ×2 IMPLANT
RELOAD STAPLE 75 3.8 BLU REG (ENDOMECHANICALS) IMPLANT
SLEEVE SURGEON STRL (DRAPES) ×4 IMPLANT
SPECIMEN JAR X LARGE (MISCELLANEOUS) ×1 IMPLANT
SPONGE ABDOMINAL VAC ABTHERA (MISCELLANEOUS) ×1 IMPLANT
SPONGE GAUZE 4X4 12PLY (GAUZE/BANDAGES/DRESSINGS) ×2 IMPLANT
SPONGE LAP 18X18 X RAY DECT (DISPOSABLE) ×1 IMPLANT
STAPLER PROXIMATE 75MM BLUE (STAPLE) ×1 IMPLANT
STAPLER VISISTAT 35W (STAPLE) ×2 IMPLANT
SUCTION POOLE TIP (SUCTIONS) ×2 IMPLANT
SUT PDS AB 1 TP1 96 (SUTURE) ×4 IMPLANT
SUT SILK 2 0 (SUTURE) ×2
SUT SILK 2 0 SH CR/8 (SUTURE) ×2 IMPLANT
SUT SILK 2-0 18XBRD TIE 12 (SUTURE) ×1 IMPLANT
SUT SILK 3 0 (SUTURE) ×2
SUT SILK 3 0 SH CR/8 (SUTURE) ×2 IMPLANT
SUT SILK 3-0 18XBRD TIE 12 (SUTURE) ×1 IMPLANT
SUT VIC AB 3-0 SH 27 (SUTURE)
SUT VIC AB 3-0 SH 27X BRD (SUTURE) IMPLANT
TOWEL OR 17X24 6PK STRL BLUE (TOWEL DISPOSABLE) ×2 IMPLANT
TOWEL OR 17X26 10 PK STRL BLUE (TOWEL DISPOSABLE) ×2 IMPLANT
TRAY FOLEY CATH 14FRSI W/METER (CATHETERS) ×1 IMPLANT
TUBE CONNECTING 12X1/4 (SUCTIONS) ×1 IMPLANT
WATER STERILE IRR 1000ML POUR (IV SOLUTION) IMPLANT
YANKAUER SUCT BULB TIP NO VENT (SUCTIONS) ×1 IMPLANT

## 2012-04-16 NOTE — ED Notes (Signed)
In and out cath done by [employer]

## 2012-04-16 NOTE — Telephone Encounter (Signed)
SN spoke with pt's son > on his way to the ER.  Will sign off.

## 2012-04-16 NOTE — Consult Note (Addendum)
[facility] [place]  Patient name: Ryan Hess MRN: [id number] DOB: Aug 21, 1924 Sex: male  REASON FOR CONSULT: Ischemic intestine on CT scan  HPI: Ryan Hess is a 78 y.o. male who underwent repair of an AAA by Dr. Kellie Simmering in 1998. He later was followed with an aneurysm of the descending thoracic aorta and ultimately underwent placement of a thoracic stent graft in [facility]. He presents tonight with the acute onset of diffuse abdominal pain, nausea, and vomitting. CT scan shows possible bowel obstruction and possible volvulus. There is also a type I endoleak noted at the distal thoracic stent graft. In discussing this with the patients son's, the thoracic stent graft was the first part of a staged repair. He would be do for further surgery soon. Difficult to obtain history from pt as he is HOH.   Past Medical History  Diagnosis Date  . COPD (chronic obstructive pulmonary disease)   . Pneumonia, organism unspecified   . Pulmonary nodule   . HTN (hypertension)   . CHF (congestive heart failure)   . Atrial fibrillation   . Drug therapy   . PVC (premature ventricular contraction)   . PVD (peripheral vascular disease)   . Thoracic aortic aneurysm   . Hypercholesteremia   . Diverticulosis of colon   . Colon polyps   . Prostate cancer   . DJD (degenerative joint disease)   . Actinic skin damage   . Hyperlipidemia   . Internal hemorrhoids     Family History  Problem Relation Age of Onset  . Dementia Mother   . Stroke Father   . Diabetes Brother   . Heart disease Brother   . Heart disease Sister   . Heart disease Brother   . Obesity Brother   . Cancer Sister     back ??  . Alzheimer's disease Sister   . Colon cancer Neg Hx     SOCIAL HISTORY: History  Substance Use Topics  . Smoking status: Former Smoker -- 2.0 packs/day for 44 years    Types: Cigarettes    Quit date: 09/25/1988  . Smokeless tobacco: Never Used  . Alcohol Use: No    No  Known Allergies  Current Facility-Administered Medications  Medication Dose Route Frequency Provider Last Rate Last Dose  . HYDROmorphone (DILAUDID) injection 0.5 mg  0.5 mg Intravenous Once Essien Bahl. Ghim, MD   0.5 mg at 04/16/12 1559  . HYDROmorphone (DILAUDID) injection 0.5 mg  0.5 mg Intravenous Once Gertie Exon, MD   0.5 mg at 04/16/12 1714  . HYDROmorphone (DILAUDID) injection 2 mg  2 mg Intravenous Once Gertie Exon, MD   2 mg at 04/16/12 1840  . iohexol (OMNIPAQUE) 300 MG/ML solution 20 mL  20 mL Oral Q1 Hr x 2 Medication Radiologist, MD   20 mL at 04/16/12 1745  . iohexol (OMNIPAQUE) 300 MG/ML solution 80 mL  80 mL Intravenous Once PRN Medication Radiologist, MD   80 mL at 04/16/12 1921  . ondansetron (ZOFRAN) 4 MG/2ML injection        4 mg at 04/16/12 1530  . ondansetron (ZOFRAN) injection 4 mg  4 mg Intravenous Once Saddie Benders. Ghim, MD   4 mg at 04/16/12 1558  . sodium chloride 0.9 % bolus 500 mL  500 mL Intravenous Once Gertie Exon, MD   500 mL at 04/16/12 1559  . sodium chloride 0.9 % bolus 500 mL  500 mL Intravenous Once Saddie Benders. Ghim, MD   500  mL at 04/16/12 2106   Current Outpatient Prescriptions  Medication Sig Dispense Refill  . ADVAIR DISKUS 250-50 MCG/DOSE AEPB Inhale 1 puff into the lungs Twice daily.      [name] atorvastatin (LIPITOR) 10 MG tablet Take 1 tablet (10 mg total) by mouth daily.  90 tablet  0  . ferrous sulfate 325 (65 FE) MG tablet Take 325 mg by mouth 2 (two) times daily.      . furosemide (LASIX) 40 MG tablet Take 40 mg by mouth 2 (two) times daily.       [name] guaiFENesin (MUCINEX) 600 MG 12 hr tablet Take 600 mg by mouth 2 (two) times daily. COPD.      . Multiple Vitamin (MULTIVITAMIN) tablet Take 1 tablet by mouth daily.        . potassium chloride (K-DUR) 10 MEQ tablet Take 1 tablet (10 mEq total) by mouth 2 (two) times daily.  180 tablet  0  . Tamsulosin HCl (FLOMAX) 0.4 MG CAPS Take 0.4 mg by mouth at bedtime.       [name] tiotropium (SPIRIVA HANDIHALER)  18 MCG inhalation capsule Place 1 capsule (18 mcg total) into inhaler and inhale daily.  90 capsule  3  . aspirin 81 MG tablet Take 81 mg by mouth daily.        [name] atenolol (TENORMIN) 25 MG tablet take 1 tablet by mouth once daily  30 tablet  11  . losartan (COZAAR) 100 MG tablet Take 50 mg by mouth daily.      [name] warfarin (COUMADIN) 1 MG tablet Take 1 mg daily for 3 days 5/17, 5/18, 5/19. INR check 5/20 for dosage adjustment.  3 tablet  0  . DISCONTD: pantoprazole (PROTONIX) 40 MG tablet Take 1 tablet (40 mg total) by mouth daily.  30 tablet  5    REVIEW OF SYSTEMS: Valu.Nieves ] denotes positive finding; [  ] denotes negative finding CARDIOVASCULAR:  '[ ]'$  chest pain   '[ ]'$  chest pressure   '[ ]'$  palpitations   '[ ]'$  orthopnea   '[ ]'$  dyspnea on exertion   '[ ]'$  claudication   '[ ]'$  rest pain   '[ ]'$  DVT   '[ ]'$  phlebitis PULMONARY:   '[ ]'$  productive cough   '[ ]'$  asthma   '[ ]'$  wheezing NEUROLOGIC:   '[ ]'$  weakness  '[ ]'$  paresthesias  '[ ]'$  aphasia  '[ ]'$  amaurosis  '[ ]'$  dizziness HEMATOLOGIC:   '[ ]'$  bleeding problems   '[ ]'$  clotting disorders MUSCULOSKELETAL:  Valu.Nieves ] joint pain   '[ ]'$  joint swelling '[ ]'$  leg swelling GASTROINTESTINAL: '[ ]'$   blood in stool  '[ ]'$   hematemesis GENITOURINARY:  '[ ]'$   dysuria  Valu.Nieves ]  hematuria PSYCHIATRIC:  '[ ]'$  history of major depression INTEGUMENTARY:  '[ ]'$  rashes  '[ ]'$  ulcers CONSTITUTIONAL:  '[ ]'$  fever   '[ ]'$  chills  PHYSICAL EXAM: Filed Vitals:   04/16/12 1930 04/16/12 2000 04/16/12 2030 04/16/12 2100  BP: 153/94 114/66 119/70 109/59  Pulse:  40    Resp: '16 19 16 19  '$ SpO2:  93%     There is no height or weight on file to calculate BMI. GENERAL: The patient is a well-nourished male, in no acute distress. The vital signs are documented above. CARDIOVASCULAR: There is a regular rate and rhythm without significant murmur appreciated. He has palpable femoral pulses and palpable PT pulses. Feet are adequately perfused.  PULMONARY: There is good air exchange bilaterally  without wheezing or rales. ABDOMEN:  Firm, tender.  MUSCULOSKELETAL: There are no major deformities or cyanosis. NEUROLOGIC: No focal weakness or paresthesias are detected. SKIN: Hyperpigmentation bilat.  PSYCHIATRIC: The patient has a normal affect.  DATA:  Lab Results  Component Value Date   WBC 7.9 04/16/2012   HGB 12.6* 04/16/2012   HCT 39.7 04/16/2012   MCV 96.4 04/16/2012   PLT 159 04/16/2012   Lab Results  Component Value Date   NA 141 04/16/2012   K 3.9 04/16/2012   CL 104 04/16/2012   CO2 23 04/16/2012   Lab Results  Component Value Date   CREATININE 1.11 04/16/2012   Lab Results  Component Value Date   INR 1.2* 03/22/2012   INR 4.2 03/11/2012   INR 3.4 02/27/2012   CT scan of Abd: Acute obstruction of right sided small bowel loops which  demonstrate dilatation, wall thickening and surrounding fluid.  There may be pneumatosis in one of the inferior dilated small bowel  loops and configuration of the obstruction suggests either  volvulus, focal internal hernia or significant focal adhesion.  2. Associated moderate free fluid surrounding dilated small bowel  loops as well as the liver, spleen and extending into the pelvis.  No focal abscess.  3. Aneurysmal disease of the native distal descending thoracic  aorta and proximal abdominal aorta with evidence of type 1 endoleak  at the distal end of a thoracic endograft.   MEDICAL ISSUES: 1. Bowel obstruction with possible volvulus: AAA repair was 15 years ago. I do not see any relationship to this (except for the risk of adhesions) nor to the Thoracic Endovascular Aneurysm Repair that was done in [facility]. SMA and Celiac are patent on CT scan. I do not see evidence of embolic disease on  CT scan. He has been off of his coumadin (for A. Fib) because of hematuria.  2. Type I endoleak S/P TEVAR done in [place]. Once he recovers from this acute problem will need to F/U in [place]. The only vascular surgeon in town who does TEVAR here is Dr. Trula Slade who is out  of town.    [place] [place] Beeper: [phone]

## 2012-04-16 NOTE — ED Notes (Signed)
Pt finished with contrast dye. CT aware.

## 2012-04-16 NOTE — ED Notes (Signed)
Dr. Biagio Quint  at bedside to update family on pt OR status

## 2012-04-16 NOTE — ED Notes (Signed)
They called for patient to go to OR, now are in need to hold at this time, will notify RN when OR is ready for surgery

## 2012-04-16 NOTE — Anesthesia Preprocedure Evaluation (Addendum)
Anesthesia Evaluation  Patient identified by MRN, date of birth, ID band Patient awake    Reviewed: Allergy & Precautions, H&P , NPO status , Patient's Chart, lab work & pertinent test results, reviewed documented beta blocker date and time   Airway Mallampati: II TM Distance: >3 FB Neck ROM: full    Dental   Pulmonary shortness of breath and at rest, pneumonia -, resolved, COPD breath sounds clear to auscultation  + decreased breath sounds      Cardiovascular hypertension, Pt. on medications and Pt. on home beta blockers + Peripheral Vascular Disease and +CHF + dysrhythmias Atrial Fibrillation Rhythm:irregular Rate:Abnormal     Neuro/Psych Pt slow to respond difficult to arouse able to provide basic info only negative neurological ROS  negative psych ROS   GI/Hepatic negative GI ROS, Neg liver ROS,   Endo/Other  negative endocrine ROS  Renal/GU negative Renal ROS  negative genitourinary   Musculoskeletal   Abdominal   Peds  Hematology negative hematology ROS (+)   Anesthesia Other Findings See surgeon's H&P   Reproductive/Obstetrics negative OB ROS                          Anesthesia Physical Anesthesia Plan  ASA: III and Emergent  Anesthesia Plan: General   Post-op Pain Management:    Induction: Intravenous, Rapid sequence and Cricoid pressure planned  Airway Management Planned: Oral ETT  Additional Equipment: Arterial line  Intra-op Plan:   Post-operative Plan: Post-operative intubation/ventilation  Informed Consent: I have reviewed the patients History and Physical, chart, labs and discussed the procedure including the risks, benefits and alternatives for the proposed anesthesia with the patient or authorized representative who has indicated his/her understanding and acceptance.   Dental Advisory Given  Plan Discussed with: CRNA, Surgeon and Anesthesiologist  Anesthesia Plan  Comments:        Anesthesia Quick Evaluation

## 2012-04-16 NOTE — ED Notes (Signed)
Pt with sudden onset of nausea, vomiting, diarrhea, severe abdominal pain in RUQ radiating across upper abdomen. Pt with nonpulsating, palpable mass to RUQ. Pt with 20g LAC, 4mg  zofran PTA.

## 2012-04-16 NOTE — ED Notes (Signed)
Surgeon MD at bedside. 

## 2012-04-16 NOTE — ED Notes (Signed)
RN X 2attempted insertion of NG, unable. Dr. Biagio Quint is at bedside and aware of the same.  OK to hold off on NG insertion at this time if pt is not vomiting

## 2012-04-16 NOTE — ED Notes (Signed)
Patient continues moan in pain. Patient and family requesting patient receive additional pain medication. Dr. Lew Dawes aware and will order additional pain medication.

## 2012-04-16 NOTE — ED Provider Notes (Signed)
I saw and evaluated the patient, reviewed the resident's note and I agree with the findings and plan.  I reviewed and agree with ECG interpretation by resident.  Pt with acute onset of diffuse abd pain, N/V.  Pt with prior surgeries in the past.  At risk for obstruction.  Also with h/o known AAA.  Pt denies syncope or back pain.  Will need CT scan.  Likely admit, IVF's, IV analgesics and antiemetics.      CT suggests ischemia, have discussed with Dr. Biagio Quint.  Will also consult with vascular surgery.  Pt remains in atrial fib with mild RVR, normotensive.  Will plan on NGT and will need admission.  CRITICAL CARE Performed by: Lear Ng   Total critical care time: 30 min  Critical care time was exclusive of separately billable procedures and treating other patients.  Critical care was necessary to treat or prevent imminent or life-threatening deterioration.  Critical care was time spent personally by me on the following activities: development of treatment plan with patient and/or surrogate as well as nursing, discussions with consultants, evaluation of patient's response to treatment, examination of patient, obtaining history from patient or surrogate, ordering and performing treatments and interventions, ordering and review of laboratory studies, ordering and review of radiographic studies, pulse oximetry and re-evaluation of patient's condition.   Gavin Pound. Dare Sanger, MD 04/16/12 2040

## 2012-04-16 NOTE — Telephone Encounter (Signed)
I spoke with Fayrene Fearing and he states the pt woke up this AM with throbbing stomach pain that has been coming and going. Rates this 7-8/10 on the pain scale. Pt laid down about 12 and the pain went away. He got up and ate some cereal then 30 minutes later the pain came back and he has vomited 3 times since. The pain is below the rib cage down to his belly button. The pain just started today. He had a small bowel movement but it did not help the pain. Pt is weak and looks a little clammy but per son this is due to him not having anything on his stomach. Pt has not taken anything for the pain. Requesting further recs from SN. Please advise thanks  No Known Allergies

## 2012-04-16 NOTE — ED Notes (Signed)
Re-paged Dr. Biagio Quint to [phone]

## 2012-04-16 NOTE — ED Provider Notes (Signed)
History     CSN: [id number]  Arrival date & time 04/16/12  49   First MD Initiated Contact with Patient 04/16/12 1549      Chief Complaint  Patient presents with  . Abdominal Pain    (Consider location/radiation/quality/duration/timing/severity/associated sxs/prior treatment) Patient is a 78 y.o. male presenting with abdominal pain. The history is provided by the patient.  Abdominal Pain The primary symptoms of the illness include abdominal pain, nausea, vomiting and diarrhea. The primary symptoms of the illness do not include fever, fatigue, shortness of breath, hematemesis, hematochezia or dysuria. The current episode started 6 to 12 hours ago. The onset of the illness was sudden. The problem has not changed since onset. Pain Location: right abdomen. The abdominal pain does not radiate. The abdominal pain is relieved by nothing. The abdominal pain is exacerbated by eating.  Risk factors for an acute abdominal problem include a history of abdominal surgery. Symptoms associated with the illness do not include constipation, urgency, hematuria, frequency or back pain. Associated medical issues comments: hx of AAA s/p surgical repair.    Past Medical History  Diagnosis Date  . COPD (chronic obstructive pulmonary disease)   . Pneumonia, organism unspecified   . Pulmonary nodule   . HTN (hypertension)   . CHF (congestive heart failure)   . Atrial fibrillation   . Drug therapy   . PVC (premature ventricular contraction)   . PVD (peripheral vascular disease)   . Thoracic aortic aneurysm   . Hypercholesteremia   . Diverticulosis of colon   . Colon polyps   . Prostate cancer   . DJD (degenerative joint disease)   . Actinic skin damage   . Hyperlipidemia   . Internal hemorrhoids     Past Surgical History  Procedure Date  . Total knee arthroplasty     bilateral  . Abdominal aortic aneurysm repair 1998  . Appendectomy   . Inguinal hernia repair     left  . Joint replacement      Family History  Problem Relation Age of Onset  . Dementia Mother   . Stroke Father   . Diabetes Brother   . Heart disease Brother   . Heart disease Sister   . Heart disease Brother   . Obesity Brother   . Cancer Sister     back ??  . Alzheimer's disease Sister   . Colon cancer Neg Hx     History  Substance Use Topics  . Smoking status: Former Smoker -- 2.0 packs/day for 44 years    Types: Cigarettes    Quit date: 09/25/1988  . Smokeless tobacco: Never Used  . Alcohol Use: No      Review of Systems  Constitutional: Negative for fever, activity change, appetite change and fatigue.  HENT: Negative for congestion, sore throat, facial swelling, rhinorrhea, trouble swallowing, neck pain, neck stiffness, voice change and sinus pressure.   Eyes: Negative.   Respiratory: Negative for cough, choking, chest tightness, shortness of breath and wheezing.   Cardiovascular: Negative for chest pain.  Gastrointestinal: Positive for nausea, vomiting, abdominal pain, diarrhea and abdominal distention. Negative for constipation, blood in stool, hematochezia and hematemesis.  Genitourinary: Negative for dysuria, urgency, frequency, hematuria, flank pain and difficulty urinating.  Musculoskeletal: Negative for back pain and gait problem.  Skin: Negative for rash and wound.  Neurological: Negative for facial asymmetry, weakness, numbness and headaches.  Psychiatric/Behavioral: Negative for behavioral problems, confusion and agitation. The patient is not nervous/anxious and is not hyperactive.  All other systems reviewed and are negative.    Allergies  Review of patient's allergies indicates no known allergies.  Home Medications   Current Outpatient Rx  Name Route Sig Dispense Refill  . ADVAIR DISKUS 250-50 MCG/DOSE IN AEPB Inhalation Inhale 1 puff into the lungs Twice daily.    . ASPIRIN 81 MG PO TABS Oral Take 81 mg by mouth daily.      . ATENOLOL 25 MG PO TABS  take 1 tablet by  mouth once daily 30 tablet 11  . ATORVASTATIN CALCIUM 10 MG PO TABS Oral Take 1 tablet (10 mg total) by mouth daily. 90 tablet 0  . FERROUS SULFATE 325 (65 FE) MG PO TABS Oral Take 325 mg by mouth 2 (two) times daily.    . FUROSEMIDE 40 MG PO TABS Oral Take 40 mg by mouth daily.    . GUAIFENESIN ER 600 MG PO TB12 Oral Take 600 mg by mouth 2 (two) times daily. COPD.    [name] LOSARTAN POTASSIUM 100 MG PO TABS Oral Take 50 mg by mouth daily.    [name] ONE-DAILY MULTI VITAMINS PO TABS Oral Take 1 tablet by mouth daily.      [name] POLYETHYLENE GLYCOL 3350 PO PACK Oral Take 17 g by mouth daily. Mixed in water    . POTASSIUM CHLORIDE ER 10 MEQ PO TBCR Oral Take 1 tablet (10 mEq total) by mouth 2 (two) times daily. 180 tablet 0  . SENNOSIDES-DOCUSATE SODIUM 8.6-50 MG PO TABS Oral Take 2 tablets by mouth at bedtime.    . TAMSULOSIN HCL 0.4 MG PO CAPS Oral Take 0.8 mg by mouth at bedtime.    [name] TIOTROPIUM BROMIDE MONOHYDRATE 18 MCG IN CAPS Inhalation Place 1 capsule (18 mcg total) into inhaler and inhale daily. 90 capsule 3  . WARFARIN SODIUM 1 MG PO TABS  Take 1 mg daily for 3 days 5/17, 5/18, 5/19. INR check 5/20 for dosage adjustment. 3 tablet 0    There were no vitals taken for this visit.  Physical Exam  Nursing note and vitals reviewed. Constitutional: He is oriented to person, place, and time. He appears well-developed and well-nourished. No distress.  HENT:  Head: Normocephalic and atraumatic.  Right Ear: External ear normal.  Left Ear: External ear normal.  Mouth/Throat: No oropharyngeal exudate.  Eyes: Conjunctivae and EOM are normal. Pupils are equal, round, and reactive to light. Right eye exhibits no discharge. Left eye exhibits no discharge.  Neck: Normal range of motion. Neck supple. No JVD present. No tracheal deviation present. No thyromegaly present.  Cardiovascular: Normal rate, regular rhythm, normal heart sounds and intact distal pulses.  Exam reveals no gallop and no friction rub.   No  murmur heard. Pulmonary/Chest: Effort normal and breath sounds normal. No respiratory distress. He has no wheezes. He exhibits no tenderness.  Abdominal: Soft. Bowel sounds are normal. He exhibits no distension. There is generalized tenderness (worse on the right side). There is no rebound and no guarding.  Musculoskeletal: Normal range of motion. He exhibits no edema and no tenderness.  Lymphadenopathy:    He has no cervical adenopathy.  Neurological: He is alert and oriented to person, place, and time. No cranial nerve deficit.  Skin: Skin is warm and dry. No rash noted. He is not diaphoretic. No pallor.  Psychiatric: He has a normal mood and affect. His behavior is normal.    ED Course  Procedures (including critical care time)   Labs Reviewed  COMPREHENSIVE METABOLIC PANEL  CBC WITH DIFFERENTIAL  URINALYSIS, ROUTINE W REFLEX MICROSCOPIC  LACTIC ACID, PLASMA   No results found.   No diagnosis found.    MDM  78 year old no patient with past medical history of AAA status post graft repair congestive heart failure A. fib COPD presents with acute onset abdominal pain nausea vomiting. Patient says he was feeling his normal state of health this morning when oxygen was 6 hours prior to arrival he had acute onset abdominal pain which was worse in the right side and nausea and vomiting. Patient able tolerate by mouth this patient had one episode of diarrhea before this happened. No recent fevers or illnesses. No chest pain shortness of breath. Bedside ultrasound showed a 4.7 x 3.6 cm AAA with no signs of extravasation, but there were dilated loops of bowel on the right side abdomen concerning for possible obstruction given his history of abdominal surgery. We'll have patient get a CT to evaluate for possible obstruction.  Results for orders placed during the hospital encounter of 04/16/12  COMPREHENSIVE METABOLIC PANEL      Component Value Range   Sodium 141  135 - 145 mEq/L   Potassium  3.9  3.5 - 5.1 mEq/L   Chloride 104  96 - 112 mEq/L   CO2 23  19 - 32 mEq/L   Glucose, Bld 131 (*) 70 - 99 mg/dL   BUN 18  6 - 23 mg/dL   Creatinine, Ser 1.11  0.50 - 1.35 mg/dL   Calcium 9.1  8.4 - 10.5 mg/dL   Total Protein 6.6  6.0 - 8.3 g/dL   Albumin 3.6  3.5 - 5.2 g/dL   AST 27  0 - 37 U/L   ALT 14  0 - 53 U/L   Alkaline Phosphatase 141 (*) 39 - 117 U/L   Total Bilirubin 1.6 (*) 0.3 - 1.2 mg/dL   GFR calc non Af Amer 58 (*) >90 mL/min   GFR calc Af Amer 67 (*) >90 mL/min  CBC WITH DIFFERENTIAL      Component Value Range   WBC 7.9  4.0 - 10.5 K/uL   RBC 4.12 (*) 4.22 - 5.81 MIL/uL   Hemoglobin 12.6 (*) 13.0 - 17.0 g/dL   HCT 39.7  39.0 - 52.0 %   MCV 96.4  78.0 - 100.0 fL   MCH 30.6  26.0 - 34.0 pg   MCHC 31.7  30.0 - 36.0 g/dL   RDW 17.1 (*) 11.5 - 15.5 %   Platelets 159  150 - 400 K/uL   Neutrophils Relative 87 (*) 43 - 77 %   Neutro Abs 6.9  1.7 - 7.7 K/uL   Lymphocytes Relative 7 (*) 12 - 46 %   Lymphs Abs 0.5 (*) 0.7 - 4.0 K/uL   Monocytes Relative 6  3 - 12 %   Monocytes Absolute 0.4  0.1 - 1.0 K/uL   Eosinophils Relative 0  0 - 5 %   Eosinophils Absolute 0.0  0.0 - 0.7 K/uL   Basophils Relative 0  0 - 1 %   Basophils Absolute 0.0  0.0 - 0.1 K/uL  URINALYSIS, ROUTINE W REFLEX MICROSCOPIC      Component Value Range   Color, Urine YELLOW  YELLOW   APPearance CLEAR  CLEAR   Specific Gravity, Urine 1.018  1.005 - 1.030   pH 5.5  5.0 - 8.0   Glucose, UA NEGATIVE  NEGATIVE mg/dL   Hgb urine dipstick NEGATIVE  NEGATIVE   Bilirubin Urine NEGATIVE  NEGATIVE   Ketones, ur NEGATIVE  NEGATIVE mg/dL   Protein, ur NEGATIVE  NEGATIVE mg/dL   Urobilinogen, UA 0.2  0.0 - 1.0 mg/dL   Nitrite NEGATIVE  NEGATIVE   Leukocytes, UA TRACE (*) NEGATIVE  LACTIC ACID, PLASMA      Component Value Range   Lactic Acid, Venous 1.8  0.5 - 2.2 mmol/L  LIPASE, BLOOD      Component Value Range   Lipase 14  11 - 59 U/L  URINE MICROSCOPIC-ADD ON      Component Value Range   WBC, UA  0-2  <3 WBC/hpf   Casts HYALINE CASTS (*) NEGATIVE  PROTIME-INR      Component Value Range   Prothrombin Time 15.9 (*) 11.6 - 15.2 seconds   INR 1.24  0.00 - 1.49  APTT      Component Value Range   aPTT 31  24 - 37 seconds  TYPE AND SCREEN      Component Value Range   ABO/RH(D) O POS     Antibody Screen NEG     Sample Expiration 04/19/2012    ABO/RH      Component Value Range   ABO/RH(D) O POS     CT Abdomen Pelvis W Contrast (Final result)   Result time:04/16/12 1952    Final result by Rad Results In Interface (04/16/12 19:52:29)    Narrative:   *RADIOLOGY REPORT*  Clinical Data: Acute onset of nausea, vomiting, diarrhea and right upper abdominal pain. Palpable right upper quadrant abdominal mass by exam.  CT ABDOMEN AND PELVIS WITH CONTRAST  Technique: Multidetector CT imaging of the abdomen and pelvis was performed following the standard protocol during bolus administration of intravenous contrast.  Contrast: 77m OMNIPAQUE IOHEXOL 300 MG/ML SOLN  Comparison: 02/03/2012  Findings: Multiple dilated and fluid-filled small bowel loops localize to the right side of the peritoneal cavity. These loops are visibly thickened and hyperemic and show significant surrounding free fluid. At least one inferior loop demonstrates probable pneumatosis. There is no evidence of bowel perforation. These bowel loops converge into a central vascular pedicle and findings may reflect some type of volvulus or internal hernia. It is also possible that this may be secondary to an adhesion. Maximal caliber of dilated small bowel is approximately 3.3 cm.  Moderate free fluid extends up to surround the liver and spleen. There also is some free fluid in both pericolic gutters and the pelvis. No evidence of focal abscess. Other small bowel loops and the colon are nondilated.  Within the visualized lower chest and upper abdomen, the inferior end of an aortic endograft is visualized which  appears to be opposed to the aortic lumen in the lower chest and then shows poor apposition to the aorta at the level of persistent aneurysm in the lower thoracic aorta and upper abdominal aorta at the level of the diaphragmatic hiatus. Contrast surrounds the endograft distally and the aorta demonstrates maximal diameter of approximately 6 cm. Based on appearance there is at least a type 1 endoleak distally.  Native upper abdominal aorta at the level of the celiac origin demonstrates aneurysmal disease with maximal diameter of 4.9 cm. Below the level of the superior mesenteric artery, there is evidence of prior abdominal aortic aneurysm repair with an aortobifemoral graft present. There is evidence of a probable reimplanted right renal artery and proximal stenotic left renal artery.  IMPRESSION:  1. Acute obstruction of right sided small bowel loops which demonstrate dilatation, wall thickening and surrounding fluid. There  may be pneumatosis in one of the inferior dilated small bowel loops and configuration of the obstruction suggests either volvulus, focal internal hernia or significant focal adhesion.  2. Associated moderate free fluid surrounding dilated small bowel loops as well as the liver, spleen and extending into the pelvis. No focal abscess. 3. 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  Original Report Authenticated By: Azzie Roup, M.D.    Date: 04/17/2012  Rate: 115  Rhythm: atrial fibrillation and premature ventricular contractions (PVC)  QRS Axis: normal  Intervals: normal  ST/T Wave abnormalities: normal  Conduction Disutrbances:none  Narrative Interpretation:   Old EKG Reviewed: changes noted    CT shows obstruction and possible signs of bowel ischemia. Surgery and vascular surgery consulted, Patient will go to operating  room tonight to help alleviate obstruction.  Case discussed with Dr. Jeanine Luz, MD 04/17/12 [phone]

## 2012-04-16 NOTE — H&P (Signed)
Ryan Hess is an 78 y.o. male.  HPI: I was asked to evaluate this patient for evaluation of abdominal pain and a possible bowel obstruction. He has several medical problems including peripheral vascular disease, atrial fibrillation, and abdominal and thoracic aortic aneurysm. He comes in today complaining of increasing abdominal pain. He had a fairly acute onset of right-sided abdominal pain beginning at about 9:00 this morning which progressed throughout the day. He was feeling better at lunchtime and so he had some serial which he vomited up. Since then he has had continued and increasing right-sided abdominal pain which he describes as it out of 10 pain. He's had some black stools which is attributed to being on iron. Most of the history is from his 2 sons as the patient is not really very responsive since being given pain medication. He denies any blood in the stools denies any fevers or chills or any other associated symptoms he mainly has complaints of abdominal pain. He has not really had any nausea as well as but he did have a single episode of vomiting after lunch. He has a history of atrial fibrillation and was on Coumadin until approximately 3 weeks ago and this was stopped due to hematuria from the catheter which was placed for urinary retention. He denies any postprandial abdominal pain. He did have an aortic stent graft placed in March or April of this years at [facility].    Past Medical History  Diagnosis Date  . COPD (chronic obstructive pulmonary disease)   . Pneumonia, organism unspecified   . Pulmonary nodule   . HTN (hypertension)   . CHF (congestive heart failure)   . Atrial fibrillation   . Drug therapy   . PVC (premature ventricular contraction)   . PVD (peripheral vascular disease)   . Thoracic aortic aneurysm   . Hypercholesteremia   . Diverticulosis of colon   . Colon polyps   . Prostate cancer   . DJD (degenerative joint disease)   . Actinic skin damage   .  Hyperlipidemia   . Internal hemorrhoids     Past Surgical History  Procedure Date  . Total knee arthroplasty     bilateral  . Abdominal aortic aneurysm repair 1998  . Appendectomy   . Inguinal hernia repair     left  . Joint replacement     Family History  Problem Relation Age of Onset  . Dementia Mother   . Stroke Father   . Diabetes Brother   . Heart disease Brother   . Heart disease Sister   . Heart disease Brother   . Obesity Brother   . Cancer Sister     back ??  . Alzheimer's disease Sister   . Colon cancer Neg Hx     Social History:  reports that he quit smoking about 23 years ago. His smoking use included Cigarettes. He has a 88 pack-year smoking history. He has never used smokeless tobacco. He reports that he does not drink alcohol or use illicit drugs.  Allergies: No Known Allergies  Medications: see ER record  Results for orders placed during the hospital encounter of 04/16/12 (from the past 48 hour(s))  COMPREHENSIVE METABOLIC PANEL     Status: Abnormal   Collection Time   04/16/12  4:12 PM      Component Value Range Comment   Sodium 141  135 - 145 mEq/L    Potassium 3.9  3.5 - 5.1 mEq/L    Chloride 104  96 - 112 mEq/L    CO2 23  19 - 32 mEq/L    Glucose, Bld 131 (*) 70 - 99 mg/dL    BUN 18  6 - 23 mg/dL    Creatinine, Ser 1.11  0.50 - 1.35 mg/dL    Calcium 9.1  8.4 - 10.5 mg/dL    Total Protein 6.6  6.0 - 8.3 g/dL    Albumin 3.6  3.5 - 5.2 g/dL    AST 27  0 - 37 U/L    ALT 14  0 - 53 U/L    Alkaline Phosphatase 141 (*) 39 - 117 U/L    Total Bilirubin 1.6 (*) 0.3 - 1.2 mg/dL    GFR calc non Af Amer 58 (*) >90 mL/min    GFR calc Af Amer 67 (*) >90 mL/min   CBC WITH DIFFERENTIAL     Status: Abnormal   Collection Time   04/16/12  4:12 PM      Component Value Range Comment   WBC 7.9  4.0 - 10.5 K/uL    RBC 4.12 (*) 4.22 - 5.81 MIL/uL    Hemoglobin 12.6 (*) 13.0 - 17.0 g/dL    HCT 39.7  39.0 - 52.0 %    MCV 96.4  78.0 - 100.0 fL    MCH 30.6  26.0  - 34.0 pg    MCHC 31.7  30.0 - 36.0 g/dL    RDW 17.1 (*) 11.5 - 15.5 %    Platelets 159  150 - 400 K/uL    Neutrophils Relative 87 (*) 43 - 77 %    Neutro Abs 6.9  1.7 - 7.7 K/uL    Lymphocytes Relative 7 (*) 12 - 46 %    Lymphs Abs 0.5 (*) 0.7 - 4.0 K/uL    Monocytes Relative 6  3 - 12 %    Monocytes Absolute 0.4  0.1 - 1.0 K/uL    Eosinophils Relative 0  0 - 5 %    Eosinophils Absolute 0.0  0.0 - 0.7 K/uL    Basophils Relative 0  0 - 1 %    Basophils Absolute 0.0  0.0 - 0.1 K/uL   LACTIC ACID, PLASMA     Status: Normal   Collection Time   04/16/12  4:12 PM      Component Value Range Comment   Lactic Acid, Venous 1.8  0.5 - 2.2 mmol/L   LIPASE, BLOOD     Status: Normal   Collection Time   04/16/12  4:12 PM      Component Value Range Comment   Lipase 14  11 - 59 U/L   URINALYSIS, ROUTINE W REFLEX MICROSCOPIC     Status: Abnormal   Collection Time   04/16/12  6:10 PM      Component Value Range Comment   Color, Urine YELLOW  YELLOW    APPearance CLEAR  CLEAR    Specific Gravity, Urine 1.018  1.005 - 1.030    pH 5.5  5.0 - 8.0    Glucose, UA NEGATIVE  NEGATIVE mg/dL    Hgb urine dipstick NEGATIVE  NEGATIVE    Bilirubin Urine NEGATIVE  NEGATIVE    Ketones, ur NEGATIVE  NEGATIVE mg/dL    Protein, ur NEGATIVE  NEGATIVE mg/dL    Urobilinogen, UA 0.2  0.0 - 1.0 mg/dL    Nitrite NEGATIVE  NEGATIVE    Leukocytes, UA TRACE (*) NEGATIVE   URINE MICROSCOPIC-ADD ON     Status: Abnormal  Collection Time   04/16/12  6:10 PM      Component Value Range Comment   WBC, UA 0-2  <3 WBC/hpf    Casts HYALINE CASTS (*) NEGATIVE   PROTIME-INR     Status: Abnormal   Collection Time   04/16/12  8:44 PM      Component Value Range Comment   Prothrombin Time 15.9 (*) 11.6 - 15.2 seconds    INR 1.24  0.00 - 1.49   APTT     Status: Normal   Collection Time   04/16/12  8:44 PM      Component Value Range Comment   aPTT 31  24 - 37 seconds   TYPE AND SCREEN     Status: Normal   Collection Time    04/16/12  8:48 PM      Component Value Range Comment   ABO/RH(D) O POS      Antibody Screen NEG      Sample Expiration 04/19/2012     ABO/RH     Status: Normal   Collection Time   04/16/12  8:48 PM      Component Value Range Comment   ABO/RH(D) O POS       Ct Abdomen Pelvis W Contrast  04/16/2012  *RADIOLOGY REPORT*  Clinical Data: Acute onset of nausea, vomiting, diarrhea and right upper abdominal pain.  Palpable right upper quadrant abdominal mass by exam.  CT ABDOMEN AND PELVIS WITH CONTRAST  Technique:  Multidetector CT imaging of the abdomen and pelvis was performed following the standard protocol during bolus administration of intravenous contrast.  Contrast: 51m OMNIPAQUE IOHEXOL 300 MG/ML  SOLN  Comparison: 02/03/2012  Findings: Multiple dilated and fluid-filled small bowel loops localize to the right side of the peritoneal cavity.  These loops are visibly thickened and hyperemic and show significant surrounding free fluid.  At least one inferior loop demonstrates probable pneumatosis.  There is no evidence of bowel perforation. These bowel loops converge into a central vascular pedicle and findings may reflect some type of volvulus or internal hernia.  It is also possible that this may be secondary to an adhesion. Maximal caliber of dilated small bowel is approximately 3.3 cm.  Moderate free fluid extends up to surround the liver and spleen. There also is some free fluid in both pericolic gutters and the pelvis.  No evidence of focal abscess.  Other small bowel loops and the colon are nondilated.  Within the visualized lower chest and upper abdomen, the inferior end of an aortic endograft is visualized which appears to be opposed to the aortic lumen in the lower chest and then shows poor apposition to the aorta at the level of persistent aneurysm in the lower thoracic aorta and upper abdominal aorta at the level of the diaphragmatic hiatus.  Contrast surrounds the endograft distally and the  aorta demonstrates maximal diameter of approximately 6 cm. Based on appearance there is at least a type 1 endoleak distally.  Native upper abdominal aorta at the level of the celiac origin demonstrates aneurysmal disease with maximal diameter of 4.9 cm. Below the level of the superior mesenteric artery, there is evidence of prior abdominal aortic aneurysm repair with an aortobifemoral graft present.  There is evidence of a probable reimplanted right renal artery and proximal stenotic left renal artery.  IMPRESSION:  1.  Acute obstruction of right sided small bowel loops which demonstrate dilatation, wall thickening and surrounding fluid. There may be pneumatosis in one of the inferior dilated small  bowel loops and configuration of the obstruction suggests either volvulus, focal internal hernia or significant focal adhesion.  2.  Associated moderate free fluid surrounding dilated small bowel loops as well as the liver, spleen and extending into the pelvis. No focal abscess. 3.  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  Original Report Authenticated By: Azzie Roup, M.D.    All other review of systems negative or noncontributory except as stated in the HPI  Blood pressure 109/59, pulse 40, resp. rate 19, SpO2 93.00%. General appearance: cooperative and slowed mentation Head: Normocephalic, without obvious abnormality, atraumatic Resp: nonlabored Cardio: tachycardic, irregular, atrial fibrillation on rhythm monitor GI: soft, mild distension, right sided tenderness, some occasional focal guarding in RUQ, no diffuse peritonitis Extremities: cold feet but perfused Pulses: bilateral PT pulses palpable Neurologic: Mental status: slowed mentation but responds appropriately to questions  Assessment/Plan: Abdominal pain concerning for bowel obstruction or ischemic bowel Though  his white blood cell count is normal and his lactate is in the normal range, I am concerned for possible ischemic bowel given his abdominal exam, history of vascular disease and atrial fibrillation, and his CT scan which demonstrates bowel thickening of and fluid around the intestines as well as possible pneumatosis. Have discussed the possible causes with the patient and his 2 sons and discussed the options for possible diagnosis and treatment. I have recommended exploratory laparotomy for further evaluation and possible treatment. I discussed with them the risks of the procedure including infection, bleeding, pain, scarring, bowel injury, negative laparotomy, need for bowel resection, possible need for ostomy, and death especially given his age and comorbidities and they expressed understanding and would like to proceed with diagnostic laparotomy.  The vascular surgeon is also to evaluate the patient and he does not feel that this is due to his graft or ischemic process from embolism or thrombosis but ischemia still could be a possibility due to to volvulus. They have mentioned that they would not want to have an ostomy if this meant possible death for the patient.we also discussed the possibility that if this is about stretching that this may resolve with nonoperative treatment as well but if this is ischemia, emergent treatment would be necessary. Again, they would like to proceed with laparotomy.  I did discuss the procedure and the risks with the patient but he is somewhat obtunded from the pain medication and/or the abdominal process and he expressed understanding and desired to proceed with the procedures well but his son signed the consent form for the patient.  [place], [place] 04/16/2012, 10:03 PM

## 2012-04-17 ENCOUNTER — Inpatient Hospital Stay (HOSPITAL_COMMUNITY): Payer: Medicare Other

## 2012-04-17 ENCOUNTER — Telehealth: Payer: Self-pay | Admitting: Vascular Surgery

## 2012-04-17 ENCOUNTER — Encounter (HOSPITAL_COMMUNITY): Payer: Self-pay | Admitting: General Surgery

## 2012-04-17 DIAGNOSIS — K56609 Unspecified intestinal obstruction, unspecified as to partial versus complete obstruction: Secondary | ICD-10-CM

## 2012-04-17 DIAGNOSIS — J95821 Acute postprocedural respiratory failure: Secondary | ICD-10-CM

## 2012-04-17 DIAGNOSIS — J9589 Other postprocedural complications and disorders of respiratory system, not elsewhere classified: Secondary | ICD-10-CM

## 2012-04-17 DIAGNOSIS — J4489 Other specified chronic obstructive pulmonary disease: Secondary | ICD-10-CM

## 2012-04-17 DIAGNOSIS — I4891 Unspecified atrial fibrillation: Secondary | ICD-10-CM

## 2012-04-17 DIAGNOSIS — J449 Chronic obstructive pulmonary disease, unspecified: Secondary | ICD-10-CM

## 2012-04-17 DIAGNOSIS — I509 Heart failure, unspecified: Secondary | ICD-10-CM

## 2012-04-17 LAB — POCT I-STAT 3, ART BLOOD GAS (G3+)
Acid-base deficit: 2 mmol/L (ref 0.0–2.0)
Bicarbonate: 23.5 mEq/L (ref 20.0–24.0)
O2 Saturation: 99 %
Patient temperature: 97.4
TCO2: 25 mmol/L (ref 0–100)
pCO2 arterial: 42.2 mmHg (ref 35.0–45.0)
pH, Arterial: 7.35 (ref 7.350–7.450)
pO2, Arterial: 148 mmHg — ABNORMAL HIGH (ref 80.0–100.0)

## 2012-04-17 LAB — COMPREHENSIVE METABOLIC PANEL
ALT: 11 U/L (ref 0–53)
AST: 24 U/L (ref 0–37)
Albumin: 3.2 g/dL — ABNORMAL LOW (ref 3.5–5.2)
Alkaline Phosphatase: 110 U/L (ref 39–117)
BUN: 20 mg/dL (ref 6–23)
CO2: 21 mEq/L (ref 19–32)
Calcium: 8.4 mg/dL (ref 8.4–10.5)
Chloride: 108 mEq/L (ref 96–112)
Creatinine, Ser: 1.17 mg/dL (ref 0.50–1.35)
GFR calc Af Amer: 63 mL/min — ABNORMAL LOW (ref >90)
GFR calc non Af Amer: 55 mL/min — ABNORMAL LOW (ref >90)
Glucose, Bld: 136 mg/dL — ABNORMAL HIGH (ref 70–99)
Potassium: 4.7 mEq/L (ref 3.5–5.1)
Sodium: 142 mEq/L (ref 135–145)
Total Bilirubin: 1.9 mg/dL — ABNORMAL HIGH (ref 0.3–1.2)
Total Protein: 5.5 g/dL — ABNORMAL LOW (ref 6.0–8.3)

## 2012-04-17 LAB — CBC
HCT: 33.2 % — ABNORMAL LOW (ref 39.0–52.0)
Hemoglobin: 10.5 g/dL — ABNORMAL LOW (ref 13.0–17.0)
MCH: 30.6 pg (ref 26.0–34.0)
MCHC: 31.6 g/dL (ref 30.0–36.0)
MCV: 96.8 fL (ref 78.0–100.0)
Platelets: 139 10*3/uL — ABNORMAL LOW (ref 150–400)
RBC: 3.43 MIL/uL — ABNORMAL LOW (ref 4.22–5.81)
RDW: 17.4 % — ABNORMAL HIGH (ref 11.5–15.5)
WBC: 11.8 10*3/uL — ABNORMAL HIGH (ref 4.0–10.5)

## 2012-04-17 LAB — GLUCOSE, CAPILLARY
Glucose-Capillary: 138 mg/dL — ABNORMAL HIGH (ref 70–99)
Glucose-Capillary: 97 mg/dL (ref 70–99)
Glucose-Capillary: 97 mg/dL (ref 70–99)
Glucose-Capillary: 94 mg/dL (ref 70–99)
Glucose-Capillary: 95 mg/dL (ref 70–99)

## 2012-04-17 LAB — POCT I-STAT 7, (LYTES, BLD GAS, ICA,H+H)
Acid-base deficit: 2 mmol/L (ref 0.0–2.0)
Bicarbonate: 23.7 mEq/L (ref 20.0–24.0)
Calcium, Ion: 1.14 mmol/L (ref 1.13–1.30)
HCT: 32 % — ABNORMAL LOW (ref 39.0–52.0)
Hemoglobin: 10.9 g/dL — ABNORMAL LOW (ref 13.0–17.0)
O2 Saturation: 100 %
Patient temperature: 35.4
Potassium: 5.1 mEq/L (ref 3.5–5.1)
Sodium: 142 mEq/L (ref 135–145)
TCO2: 25 mmol/L (ref 0–100)
pCO2 arterial: 38.8 mmHg (ref 35.0–45.0)
pH, Arterial: 7.387 (ref 7.350–7.450)
pO2, Arterial: 386 mmHg — ABNORMAL HIGH (ref 80.0–100.0)

## 2012-04-17 LAB — MRSA PCR SCREENING: MRSA by PCR: NEGATIVE

## 2012-04-17 LAB — MAGNESIUM: Magnesium: 2 mg/dL (ref 1.5–2.5)

## 2012-04-17 MED ORDER — MIDAZOLAM BOLUS VIA INFUSION
1.0000 mg | INTRAVENOUS | Status: DC | PRN
  Filled 2012-04-17: qty 2

## 2012-04-17 MED ORDER — FENTANYL BOLUS VIA INFUSION
50.0000 ug | Freq: Four times a day (QID) | INTRAVENOUS | Status: DC | PRN
  Filled 2012-04-17: qty 100

## 2012-04-17 MED ORDER — METOCLOPRAMIDE HCL 5 MG/ML IJ SOLN
10.0000 mg | Freq: Once | INTRAMUSCULAR | Status: DC | PRN
  Filled 2012-04-17: qty 2

## 2012-04-17 MED ORDER — LACTATED RINGERS IV SOLN
INTRAVENOUS | Status: DC | PRN
  Administered 2012-04-16 – 2012-04-17 (×2): via INTRAVENOUS

## 2012-04-17 MED ORDER — LACTATED RINGERS IV SOLN
INTRAVENOUS | Status: DC
  Administered 2012-04-17 (×3): via INTRAVENOUS
  Administered 2012-04-18: 125 mL/h via INTRAVENOUS
  Administered 2012-04-19: 11:00:00 via INTRAVENOUS
  Administered 2012-04-19: 125 mL/h via INTRAVENOUS
  Administered 2012-04-20 – 2012-04-22 (×4): via INTRAVENOUS

## 2012-04-17 MED ORDER — DILTIAZEM HCL 100 MG IV SOLR
5.0000 mg/h | INTRAVENOUS | Status: AC
  Administered 2012-04-17: 5 mg/h via INTRAVENOUS
  Filled 2012-04-17: qty 100

## 2012-04-17 MED ORDER — PANTOPRAZOLE SODIUM 40 MG IV SOLR
40.0000 mg | Freq: Every day | INTRAVENOUS | Status: DC
  Administered 2012-04-17: 40 mg via INTRAVENOUS
  Filled 2012-04-17 (×3): qty 40

## 2012-04-17 MED ORDER — FENTANYL CITRATE 0.05 MG/ML IJ SOLN
INTRAMUSCULAR | Status: DC | PRN
  Administered 2012-04-17 (×5): 100 ug via INTRAVENOUS

## 2012-04-17 MED ORDER — ERTAPENEM SODIUM 1 G IJ SOLR
1.0000 g | INTRAMUSCULAR | Status: DC | PRN
  Administered 2012-04-17: 1 g via INTRAVENOUS

## 2012-04-17 MED ORDER — FENTANYL CITRATE 0.05 MG/ML IJ SOLN
INTRAMUSCULAR | Status: AC
  Filled 2012-04-17: qty 2

## 2012-04-17 MED ORDER — MIDAZOLAM HCL 2 MG/2ML IJ SOLN
1.0000 mg | INTRAMUSCULAR | Status: DC | PRN
  Administered 2012-04-17: 1 mg via INTRAVENOUS
  Administered 2012-04-17: 2 mg via INTRAVENOUS
  Filled 2012-04-17 (×2): qty 2

## 2012-04-17 MED ORDER — INSULIN ASPART 100 UNIT/ML ~~LOC~~ SOLN
0.0000 [IU] | SUBCUTANEOUS | Status: DC
  Administered 2012-04-17: 2 [IU] via SUBCUTANEOUS

## 2012-04-17 MED ORDER — ROCURONIUM BROMIDE 100 MG/10ML IV SOLN
INTRAVENOUS | Status: DC | PRN
  Administered 2012-04-17: 50 mg via INTRAVENOUS

## 2012-04-17 MED ORDER — FENTANYL CITRATE 0.05 MG/ML IJ SOLN
25.0000 ug | INTRAMUSCULAR | Status: DC | PRN
  Administered 2012-04-17 (×2): 50 ug via INTRAVENOUS
  Administered 2012-04-17 (×3): 25 ug via INTRAVENOUS
  Administered 2012-04-18 – 2012-04-19 (×8): 50 ug via INTRAVENOUS
  Filled 2012-04-17 (×12): qty 2

## 2012-04-17 MED ORDER — ENOXAPARIN SODIUM 30 MG/0.3ML ~~LOC~~ SOLN
30.0000 mg | SUBCUTANEOUS | Status: DC
  Administered 2012-04-17: 30 mg via SUBCUTANEOUS
  Filled 2012-04-17 (×2): qty 0.3

## 2012-04-17 MED ORDER — SODIUM CHLORIDE 0.9 % IV SOLN
50.0000 ug/h | INTRAVENOUS | Status: DC
  Filled 2012-04-17: qty 50

## 2012-04-17 MED ORDER — OXYCODONE HCL 5 MG PO TABS: 5.0000 mg | ORAL_TABLET | Freq: Once | ORAL | Status: DC | PRN

## 2012-04-17 MED ORDER — HYDROMORPHONE HCL PF 1 MG/ML IJ SOLN: 0.2500 mg | INTRAMUSCULAR | Status: DC | PRN

## 2012-04-17 MED ORDER — IPRATROPIUM-ALBUTEROL 18-103 MCG/ACT IN AERO
6.0000 | INHALATION_SPRAY | Freq: Four times a day (QID) | RESPIRATORY_TRACT | Status: DC
  Administered 2012-04-17 – 2012-04-18 (×6): 6 via RESPIRATORY_TRACT
  Filled 2012-04-17: qty 14.7

## 2012-04-17 MED ORDER — SUCCINYLCHOLINE CHLORIDE 20 MG/ML IJ SOLN
INTRAMUSCULAR | Status: DC | PRN
  Administered 2012-04-17: 100 mg via INTRAVENOUS

## 2012-04-17 MED ORDER — ALBUMIN HUMAN 5 % IV SOLN
INTRAVENOUS | Status: DC | PRN
  Administered 2012-04-17 (×2): via INTRAVENOUS

## 2012-04-17 MED ORDER — OXYCODONE HCL 5 MG/5ML PO SOLN: 5.0000 mg | Freq: Once | ORAL | Status: DC | PRN

## 2012-04-17 MED ORDER — CHLORHEXIDINE GLUCONATE 0.12 % MT SOLN
15.0000 mL | Freq: Two times a day (BID) | OROMUCOSAL | Status: DC
  Administered 2012-04-17 – 2012-04-19 (×6): 15 mL via OROMUCOSAL
  Filled 2012-04-17 (×6): qty 15

## 2012-04-17 MED ORDER — SODIUM CHLORIDE 0.9 % IV SOLN
INTRAVENOUS | Status: DC
  Administered 2012-04-18: 15 mL/h via INTRAVENOUS

## 2012-04-17 MED ORDER — ETOMIDATE 2 MG/ML IV SOLN
INTRAVENOUS | Status: DC | PRN
  Administered 2012-04-17: 12 mg via INTRAVENOUS

## 2012-04-17 MED ORDER — SODIUM CHLORIDE 0.9 % IV SOLN
INTRAVENOUS | Status: DC | PRN
  Administered 2012-04-16: via INTRAVENOUS

## 2012-04-17 MED ORDER — SODIUM CHLORIDE 0.9 % IV SOLN
2.0000 mg/h | INTRAVENOUS | Status: DC
  Filled 2012-04-17: qty 10

## 2012-04-17 MED ORDER — SODIUM CHLORIDE 0.9 % IR SOLN
Status: DC | PRN
  Administered 2012-04-17: 1

## 2012-04-17 MED ORDER — MIDAZOLAM HCL 5 MG/5ML IJ SOLN
INTRAMUSCULAR | Status: DC | PRN
  Administered 2012-04-17: 2 mg via INTRAVENOUS

## 2012-04-17 MED ORDER — BIOTENE DRY MOUTH MT LIQD
15.0000 mL | Freq: Four times a day (QID) | OROMUCOSAL | Status: DC
  Administered 2012-04-17 – 2012-04-20 (×14): 15 mL via OROMUCOSAL

## 2012-04-17 MED ORDER — DEXTROSE 5 % IV SOLN
3.0000 g | INTRAVENOUS | Status: AC
  Administered 2012-04-18: 3 g via INTRAVENOUS
  Filled 2012-04-17 (×3): qty 3000

## 2012-04-17 MED ORDER — HEPARIN SODIUM (PORCINE) 5000 UNIT/ML IJ SOLN
5000.0000 [IU] | Freq: Three times a day (TID) | INTRAMUSCULAR | Status: DC
  Administered 2012-04-17: 5000 [IU] via SUBCUTANEOUS
  Filled 2012-04-17 (×4): qty 1

## 2012-04-17 MED ORDER — SODIUM CHLORIDE 0.9 % IV BOLUS (SEPSIS)
1000.0000 mL | Freq: Once | INTRAVENOUS | Status: AC
Start: 2012-04-17 — End: 2012-04-17
  Administered 2012-04-17: 1000 mL via INTRAVENOUS

## 2012-04-17 MED ORDER — SODIUM CHLORIDE 0.9 % IV SOLN
10.0000 mg | INTRAVENOUS | Status: DC | PRN
  Administered 2012-04-17: 50 ug/min via INTRAVENOUS

## 2012-04-17 MED ORDER — CHLORHEXIDINE GLUCONATE 0.12 % MT SOLN
OROMUCOSAL | Status: AC
  Administered 2012-04-17: 15 mL
  Filled 2012-04-17: qty 15

## 2012-04-17 MED ORDER — DILTIAZEM HCL 100 MG IV SOLR
100.0000 mg | INTRAVENOUS | Status: DC | PRN
  Administered 2012-04-17: 5 mg/h via INTRAVENOUS

## 2012-04-17 MED ORDER — DILTIAZEM HCL 100 MG IV SOLR
5.0000 mg/h | INTRAVENOUS | Status: DC
  Administered 2012-04-17: 5 mg/h via INTRAVENOUS
  Administered 2012-04-18: 10 mg/h via INTRAVENOUS
  Filled 2012-04-17 (×4): qty 100

## 2012-04-17 NOTE — Telephone Encounter (Signed)
Message copied by Shari Prows on Wed Apr 17, 2012 10:58 AM ------      Message from: Melene Plan      Created: Wed Apr 17, 2012 10:22 AM      Regarding: FW: charge       Please note there are 3 patients on here. Two have messages about appts. Thanks Darel Hong      ----- Message -----         From: Chuck Hint, MD         Sent: 04/16/2012   9:51 PM           To: Reuel Derby, Melene Plan, RN      Subject: charge                                                   Level 4 consult. Can be in brackets on list            CSD            Also Ryan Hess (DOB: [birth year]) had L BKA by Dr Darrick Penna, has a blister on stump, may call to get an apt with [facility]            Also Ryan Hess (DOB: [birth year]) presented to ED with leg pain and swelling, and graft noted to be clotted. He has a catheter, I told ED that we would call him to get an apt to be reevaluated for access. He had been to ED before with leg pain and swelling and venous duplex was neg for DVT. I suspect he has an outflow problem and pain and swelling should improve now that graft is clotted.             CSD

## 2012-04-17 NOTE — Brief Op Note (Signed)
04/16/2012 - 04/17/2012  1:33 AM  PATIENT:  Ryan Hess  78 y.o. male  PRE-OPERATIVE DIAGNOSIS:  Abdominal Pain  POST-OPERATIVE DIAGNOSIS:  Abdominal Pain  PROCEDURE:  Procedure(s) (LRB): EXPLORATORY LAPAROTOMY (N/A)  SURGEON:  Surgeon(s) and Role:    * Lodema Pilot, DO - Primary  PHYSICIAN ASSISTANT:   ASSISTANTS: ingram   ANESTHESIA:   general  EBL:  Total I/O In: 2000 [I.V.:1500; IV Piggyback:500] Out: 250 [Urine:50; Blood:200]  BLOOD ADMINISTERED:none  DRAINS: abdominal wound vac   LOCAL MEDICATIONS USED:  NONE  SPECIMEN:  Source of Specimen:  small bowel   DISPOSITION OF SPECIMEN:  PATHOLOGY  COUNTS:  YES  TOURNIQUET:  * No tourniquets in log *  DICTATION: .Other Dictation: Dictation Number [id number]  PLAN OF CARE: Admit to inpatient   PATIENT DISPOSITION:  ICU - intubated and critically ill.   Delay start of Pharmacological VTE agent (>24hrs) due to surgical blood loss or risk of bleeding: no

## 2012-04-17 NOTE — Progress Notes (Signed)
1 Day Post-Op  Subjective: On vent but awake.  Objective: Vital signs in last 24 hours: Temp:  [97.4 F (36.3 C)] 97.4 F (36.3 C) (07/24 0400) Pulse Rate:  [40-116] 53  (07/24 0700) Resp:  [14-34] 18  (07/24 0700) BP: (84-161)/(45-113) 84/59 mmHg (07/24 0700) SpO2:  [91 %-100 %] 100 % (07/24 0700) Arterial Line BP: (110-140)/(56-80) 114/57 mmHg (07/24 0700) FiO2 (%):  [40 %-50 %] 40 % (07/24 0405)    Intake/Output from previous day: 07/23 0701 - 07/24 0700 In: 2870 [I.V.:2340; NG/GT:30; IV Piggyback:500] Out: 1775 [Urine:225; Emesis/NG output:700; Drains:650; Blood:200] Intake/Output this shift:    vac in place.  serous drainage.  lungs clear  Lab Results:   Basename 04/17/12 0230 04/16/12 1612  WBC 11.8* 7.9  HGB 10.5* 12.6*  HCT 33.2* 39.7  PLT 139* 159   BMET  Basename 04/17/12 0230 04/16/12 1612  NA 142 141  K 4.7 3.9  CL 108 104  CO2 21 23  GLUCOSE 136* 131*  BUN 20 18  CREATININE 1.17 1.11  CALCIUM 8.4 9.1   PT/INR  Basename 04/16/12 2044  LABPROT 15.9*  INR 1.24   ABG  Basename 04/17/12 0308  PHART 7.350  HCO3 23.5    Studies/Results: Ct Abdomen Pelvis W Contrast  04/16/2012  *RADIOLOGY REPORT*  Clinical Data: Acute onset of nausea, vomiting, diarrhea and right upper abdominal pain.  Palpable right upper quadrant abdominal mass by exam.  CT ABDOMEN AND PELVIS WITH CONTRAST  Technique:  Multidetector CT imaging of the abdomen and pelvis was performed following the standard protocol during bolus administration of intravenous contrast.  Contrast: 71m OMNIPAQUE IOHEXOL 300 MG/ML  SOLN  Comparison: 02/03/2012  Findings: Multiple dilated and fluid-filled small bowel loops localize to the right side of the peritoneal cavity.  These loops are visibly thickened and hyperemic and show significant surrounding free fluid.  At least one inferior loop demonstrates probable pneumatosis.  There is no evidence of bowel perforation. These bowel loops converge into  a central vascular pedicle and findings may reflect some type of volvulus or internal hernia.  It is also possible that this may be secondary to an adhesion. Maximal caliber of dilated small bowel is approximately 3.3 cm.  Moderate free fluid extends up to surround the liver and spleen. There also is some free fluid in both pericolic gutters and the pelvis.  No evidence of focal abscess.  Other small bowel loops and the colon are nondilated.  Within the visualized lower chest and upper abdomen, the inferior end of an aortic endograft is visualized which appears to be opposed to the aortic lumen in the lower chest and then shows poor apposition to the aorta at the level of persistent aneurysm in the lower thoracic aorta and upper abdominal aorta at the level of the diaphragmatic hiatus.  Contrast surrounds the endograft distally and the aorta demonstrates maximal diameter of approximately 6 cm. Based on appearance there is at least a type 1 endoleak distally.  Native upper abdominal aorta at the level of the celiac origin demonstrates aneurysmal disease with maximal diameter of 4.9 cm. Below the level of the superior mesenteric artery, there is evidence of prior abdominal aortic aneurysm repair with an aortobifemoral graft present.  There is evidence of a probable reimplanted right renal artery and proximal stenotic left renal artery.  IMPRESSION:  1.  Acute obstruction of right sided small bowel loops which demonstrate dilatation, wall thickening and surrounding fluid. There may be pneumatosis in one of  the inferior dilated small bowel loops and configuration of the obstruction suggests either volvulus, focal internal hernia or significant focal adhesion.  2.  Associated moderate free fluid surrounding dilated small bowel loops as well as the liver, spleen and extending into the pelvis. No focal abscess. 3.  Aneurysmal disease of the native distal descending thoracic aorta and proximal abdominal aorta with evidence  of type 1 endoleak at the distal end of a thoracic endograft.  Original Report Authenticated By: Azzie Roup, M.D.   Dg Chest Port 1 View  04/17/2012  *RADIOLOGY REPORT*  Clinical Data: Central line positioning.  PORTABLE CHEST - 1 VIEW  Comparison: 04/17/2012  Findings: Left IJ line tip projects over the upper SVC.  No pneumothorax observed.  Endotracheal tube tip is 4.9 cm above the carina.  Nasogastric tube extends into the stomach.  Tortuous thoracic aorta noted with descending aortic stent graft.  Continued interstitial accentuation noted with lingular scarring and retrocardiac opacity on the left.  IMPRESSION:  1.  Left IJ line tip projects over the upper SVC.  No pneumothorax. Otherwise stable.  Original Report Authenticated By: Carron Curie, M.D.   Portable Chest Xray  04/17/2012  *RADIOLOGY REPORT*  Clinical Data: Status post intubation.  PORTABLE CHEST - 1 VIEW  Comparison: 03/22/2012  Findings: Endotracheal tube present with the tip approximately 4 cm above the carina.  Lungs show bibasilar atelectasis and chronic lung disease.  Thoracic endograft again visualized.  The heart is mildly enlarged.  No overt pulmonary edema, focal airspace consolidation or pleural fluid identified.  IMPRESSION: Endotracheal tube tip approximately 4 cm above the carina.  Original Report Authenticated By: Azzie Roup, M.D.    Anti-infectives: Anti-infectives     Start     Dose/Rate Route Frequency Ordered Stop   04/16/12 2345   ertapenem (INVANZ) 1 g in sodium chloride 0.9 % 50 mL IVPB        1 g 100 mL/hr over 30 Minutes Intravenous  Once 04/16/12 2340            Assessment/Plan: s/p Procedure(s) (LRB): EXPLORATORY LAPAROTOMY (N/A) Supportive care. To OR Thursday for vac change possible abdominal wall closure and reanastamosis  LOS: 1 day    Murel Wigle A. 04/17/2012

## 2012-04-17 NOTE — Telephone Encounter (Addendum)
Message copied by Shari Prows on Wed Apr 17, 2012 11:05 AM ------      Message from: Melene Plan      Created: Wed Apr 17, 2012 10:22 AM      Regarding: FW: charge       Please note there are 3 patients on here. Two have messages about appts. Thanks Ryan Hong      ----- Message -----         From: Chuck Hint, MD         Sent: 04/16/2012   9:51 PM           To: Reuel Derby, Melene Plan, RN      Subject: charge                                                   Level 4 consult. Can be in brackets on list            CSD            Also Ryan Hess (DOB: [birth year]) had L BKA by Dr Darrick Penna, has a blister on stump, may call to get an apt with [facility]            Also Ryan Hess (DOB: [birth year]) presented to ED with leg pain and swelling, and graft noted to be clotted. He has a catheter, I told ED that we would call him to get an apt to be reevaluated for access. He had been to ED before with leg pain and swelling and venous duplex was neg for DVT. I suspect he has an outflow problem and pain and swelling should improve now that graft is clotted.             CSD       Ryan Hess is already scheduled for an appt on 04/18/12 w/ cef. His appt has been confirmed.

## 2012-04-17 NOTE — Procedures (Signed)
Central Venous Catheter Insertion Procedure Note BERK PILOT [id number] Oct 21, 1923  Procedure: Insertion of Central Venous Catheter Indications: Assessment of intravascular volume and Drug and/or fluid administration  Procedure Details Consent: Risks of procedure as well as the alternatives and risks of each were explained to the (patient/caregiver).  Consent for procedure obtained. Time Out: Verified patient identification, verified procedure, site/side was marked, verified correct patient position, special equipment/implants available, medications/allergies/relevent history reviewed, required imaging and test results available.  Performed  Maximum sterile technique was used including antiseptics, cap, gloves, gown, hand hygiene, mask and sheet. Skin prep: Chlorhexidine; local anesthetic administered A antimicrobial bonded/coated triple lumen catheter was placed in the left internal jugular vein using the Seldinger technique. Ultrasound used for vessel identification.  Evaluation Blood flow good Complications: No apparent complications Patient did tolerate procedure well. Chest X-ray ordered to verify placement.  CXR: normal.  MCQUAID, DOUGLAS 04/17/2012, 2:31 AM

## 2012-04-17 NOTE — Progress Notes (Signed)
Patient in atrial fibrillation (history of) 100-115 with frequent PVC's.  Patient on cardizem gtt at 5mg /hr.  AM potassium level 4.7.  Dr. Darrick Penna made aware.  Will check magnesium level and continue to monitor.

## 2012-04-17 NOTE — Progress Notes (Signed)
INITIAL ADULT NUTRITION ASSESSMENT Date: 04/17/2012   Time: 8:58 AM  INTERVENTION:  Recommend EN vs TPN in next 24-48 hours if extubation not expected RD to follow for nutrition care plan  Reason for Assessment: VDRF, Low Braden  ASSESSMENT: Male 78 y.o.  Dx: Small bowel obstruction  Hx:  Past Medical History  Diagnosis Date  . COPD (chronic obstructive pulmonary disease)   . Pneumonia, organism unspecified   . Pulmonary nodule   . HTN (hypertension)   . CHF (congestive heart failure)   . Atrial fibrillation   . Drug therapy   . PVC (premature ventricular contraction)   . PVD (peripheral vascular disease)   . Thoracic aortic aneurysm   . Hypercholesteremia   . Diverticulosis of colon   . Colon polyps   . Prostate cancer   . DJD (degenerative joint disease)   . Actinic skin damage   . Hyperlipidemia   . Internal hemorrhoids     Related Meds:     . albuterol-ipratropium  6 puff Inhalation Q6H  . antiseptic oral rinse  15 mL Mouth Rinse QID  . chlorhexidine  15 mL Mouth Rinse BID  . chlorhexidine      . diltiazem (CARDIZEM) infusion  5-15 mg/hr Intravenous To OR  . ertapenem  1 g Intravenous Once  . fentaNYL      .  HYDROmorphone (DILAUDID) injection  0.5 mg Intravenous Once  .  HYDROmorphone (DILAUDID) injection  0.5 mg Intravenous Once  .  HYDROmorphone (DILAUDID) injection  2 mg Intravenous Once  . insulin aspart  0-15 Units Subcutaneous Q4H  . iohexol  20 mL Oral Q1 Hr x 2  . ondansetron      . ondansetron (ZOFRAN) IV  4 mg Intravenous Once  . pantoprazole (PROTONIX) IV  40 mg Intravenous QHS  . sodium chloride  500 mL Intravenous Once  . sodium chloride  500 mL Intravenous Once  . DISCONTD: heparin  5,000 Units Subcutaneous Q8H    Ht: '5\' 9"'$  (175.3 cm)  Wt: 80.1 kg (176 lb)  Ideal Wt: 73 kg % Ideal Wt: 110%  Usual Wt: unable to obtain % Usual Wt: ---  BMI = 26.0 kg/m2  Food/Nutrition Related Hx: admission nutrition screen incomplete  Labs:    CMP     Component Value Date/Time   NA 142 04/17/2012 0230   K 4.7 04/17/2012 0230   CL 108 04/17/2012 0230   CO2 21 04/17/2012 0230   GLUCOSE 136* 04/17/2012 0230   BUN 20 04/17/2012 0230   CREATININE 1.17 04/17/2012 0230   CREATININE 1.30 08/29/2011 1147   CALCIUM 8.4 04/17/2012 0230   PROT 5.5* 04/17/2012 0230   ALBUMIN 3.2* 04/17/2012 0230   AST 24 04/17/2012 0230   ALT 11 04/17/2012 0230   ALKPHOS 110 04/17/2012 0230   BILITOT 1.9* 04/17/2012 0230   GFRNONAA 55* 04/17/2012 0230   GFRAA 63* 04/17/2012 0230     Intake/Output Summary (Last 24 hours) at 04/17/12 0901 Last data filed at 04/17/12 0800  Gross per 24 hour  Intake   3045 ml  Output   2075 ml  Net    970 ml    CBG (last 3)   Basename 04/17/12 0740 04/17/12 0306  GLUCAP 97 138*    Diet Order: NPO  Supplements/Tube Feeding: N/A  IVF:    sodium chloride   diltiazem (CARDIZEM) infusion   lactated ringers Last Rate: 125 mL/hr at 04/17/12 0750  DISCONTD: fentaNYL infusion INTRAVENOUS   DISCONTD: midazolam (  VERSED) infusion     Estimated Nutritional Needs:   Kcal: 1600-1700 Protein: 100-110 gm Fluid: 1.6-1.7 L  RD unable to obtain nutrition hx from patient -- intubated & sedated; admitted with abdominal pain & CT scan showing SBO; s/p exp lap with small-bowel resection & abdominal wound VAC placement 7/24; NGT to LIS; current low braden score places patient at risk for further skin breakdown; to OR 7/25 for VAC change, possible abdominal wall closure & re-anastamosis  NUTRITION DIAGNOSIS: -Inadequate oral intake (NI-2.1).  Status: Ongoing  RELATED TO: inability to eat  AS EVIDENCE BY: NPO status  MONITORING/EVALUATION(Goals): Goal: Initiate EN vs TPN in next 24-48 hours if extubation not expected Monitor: Nutrition support initiation, respiratory status, weight, labs, I/O's  EDUCATION NEEDS: -No education needs identified at this time  Dietitian #: [place] Per approved criteria   -Not Applicable    Ryan Hess 04/17/2012, 8:58 AM

## 2012-04-17 NOTE — Anesthesia Postprocedure Evaluation (Signed)
  Anesthesia Post-op Note  Patient: Ryan Hess  Procedure(s) Performed: Procedure(s) (LRB): EXPLORATORY LAPAROTOMY (N/A)  Patient Location: PACU and SICU  Anesthesia Type: General  Level of Consciousness: sedated  Airway and Oxygen Therapy: Patient remains intubated per anesthesia plan  Post-op Pain: none  Post-op Assessment: Post-op Vital signs reviewed, Patient's Cardiovascular Status Stable, Respiratory Function Stable, Patent Airway, No signs of Nausea or vomiting and Pain level controlled  Post-op Vital Signs: Reviewed and stable  Complications: No apparent anesthesia complications

## 2012-04-17 NOTE — Transfer of Care (Signed)
Immediate Anesthesia Transfer of Care Note  Patient: Ryan Hess  Procedure(s) Performed: Procedure(s) (LRB): EXPLORATORY LAPAROTOMY (N/A)  Patient Location: SICU  Anesthesia Type: General  Level of Consciousness: sedated and unresponsive  Airway & Oxygen Therapy: Patient remains intubated per anesthesia plan and Patient placed on Ventilator (see vital sign flow sheet for setting)  Post-op Assessment: Report given to PACU RN and Post -op Vital signs reviewed and stable  Post vital signs: Reviewed and stable  Complications: No apparent anesthesia complications

## 2012-04-17 NOTE — Care Management Note (Signed)
  Page 2 of 2   04/24/2012     10:27:18 AM   CARE MANAGEMENT NOTE 04/24/2012  Patient:  ALOYSUIS, RIBAUDO   Account Number:  [id number]  Date Initiated:  04/17/2012  Documentation initiated by:  Avie Arenas  Subjective/Objective Assessment:   Abd pain -exploratory laparotomy and SBO on 04/17/12.  Has spouse     Action/Plan:   Anticipated DC Date:  04/25/2012   Anticipated DC Plan:  HOME W HOME HEALTH SERVICES      DC Planning Services  CM consult      [facility] Choice  HOME HEALTH   Choice offered to / List presented to:  C-1 Patient        HH arranged  HH-1 RN  HH-2 PT      [facility] agency  [facility]   Status of service:  In process, will continue to follow Medicare Important Message given?   (If response is "NO", the following Medicare IM given date fields will be blank) Date Medicare IM given:   Date Additional Medicare IM given:    Discharge Disposition:    Per UR Regulation:  Reviewed for med. necessity/level of care/duration of stay  If discussed at Long Length of Stay Meetings, dates discussed:   04/23/2012    Comments:  Contact:  Sokolow,Peggy Spouse [phone]  Trygve Thal, son  [phone]   Ac Colan     [phone], [phone]  04-24-12 Plan to discharge to home today with [facility] . Spoke with Clance Boll with [facility]. [facility] will be used. Application form on shadow chart. MD signature needed.  Ronny Flurry RN BSN [phone]  7/31 8:19a debbie dowell rn,bsn met w pt on 7/30 and gave pt hhc list. pt not sure if will go home w vac. he does not want snf and wants to get home w wife. they have used [facility] in past and would like them again. ref to [facility] w [facility]. await md order but phy ther has rec [facility].vac forms on shadow chart if needed.  7/29 8:42a debbie dowell rn,bsn remains in icu but can transf to sdu. fam plans on home, pt/ot to eval.     04/17/12 JULIE AMERSON,RN,BSN 1115 MET WITH TWO SONS TO  DISCUSS DC PLANS.  PTA, PT INDEPENDENT, LIVES WITH SPOUSE.  SON LIVES IN GARAGE APT AT THE HOME.  THEY WANT PT TO RETURN HOME AT DC, AND ARE OPEN TO HOME HEALTH CARE, IF NEEDED.  WILL FOLLOW FOR HOME NEEDS AS PT PROGRESSES.

## 2012-04-17 NOTE — Op Note (Signed)
[name], Hess NO.:  [id number]  MEDICAL RECORD NO.:  [id number]  LOCATION:  2307                         FACILITY:  [facility]  PHYSICIAN:  Lodema Pilot, MD       DATE OF BIRTH:  1924-01-06  DATE OF PROCEDURE:  04/17/2012 DATE OF DISCHARGE:                              OPERATIVE REPORT   PROCEDURE:  Exploratory laparotomy with small-bowel resection and abdominal wound VAC placement.  PREOPERATIVE DIAGNOSIS:  Bowel ischemia.  POSTOPERATIVE DIAGNOSIS:  Internal hernia causing bowel ischemia.  SURGEON:  Lodema Pilot, MD  ASSISTANT:  Dr. Derrell Lolling.  ANESTHESIA:  General endotracheal anesthesia.  FLUIDS:  1 L crystalloid and 500 mL of albumin.  ESTIMATED BLOOD LOSS:  Minimal.  DRAINS:  Abdominal wound VAC.  SPECIMENS:  Small bowel resection sent to Pathology for permanent sectioning.  COMPLICATIONS:  None apparent.  FINDINGS:  Approximately 5 feet of frankly ischemic small bowel, likely due to internal hernia and closed loop obstruction.  Bowel resected and the small bowel left in discontinuity, again approximately 5 feet of small bowel was resected and then he approximately 7 feet of small bowel remained otherwise palpable pulses and no evidence of embolic or thrombotic pathology.  INDICATION FOR PROCEDURE:  Ryan Hess is an 78 year old male with acute onset of right-sided abdominal pain beginning this morning.  He had progressive pain throughout the day and CT scan concerning for small bowel ischemia and focal guarding in the right side of his abdomen.  OPERATIVE DETAILS:  Mr. Vacca was seen and evaluated in the emergency room and risks and benefits of the procedure were discussed in lay terms with  the patient and his son and informed consent was obtained.  He was given prophylactic antibiotics and taken to the operating room, placed on table in a supine position, and general endotracheal tube anesthesia was obtained.  Foley catheter was placed  and his abdomen was prepped and draped in a standard surgical fashion.  Procedure time-out was performed with all operative team members to confirm proper patient, procedure, then his prior midline scar was opened up and peritoneum entered.  The omentum was taken down from the abdominal wall.  The adhesive disease was not severe.  After opening his abdomen, he had some dark murky bloody appearing fluid, but no evidence of obvious perforation.  He had in the right side of the abdomen, frankly ischemic and black small-bowel loops.  These were brought up through the wound and this appeared to be a cause from a closed loop obstruction and internal hernia from omentum which had adhered to the retroperitoneum.  This adhesion was divided and the small bowel was delivered.  The frankly ischemic bowel was divided with GIA stapler at both the proximal and distal extent of the ischemia. The mesentery was divided with the LigaSure, resecting the obviously ischemic portion of bowel.  We removed approximately 5 feet of small bowel and sent this to pathology.  Then we irrigated the abdomen until the irrigation returned clear, and identified the ligament of Treitz, measured the amount of small bowel remains and he had approximately 6 feet of small bowel from the ligament of Treitz to the proximal staple  lines and a pattern of fluid in the terminal ileum before the ileocecal valve.  I palpated the SMA which had a palpable pulse and the remainder of the bowel appeared normal.  Stomach appeared normal.  Colon had some diverticulosis and some firm stool, but no evidence of ischemia and the abdomen was again irrigated with sterile saline solution.  The irrigation returned clear, and  contemplated performing anastomosis, but given the patient's overall condition, I felt it would be best to leave him in discontinuity and place an abdominal wound VAC and perform a second-look operation anastomosis in  approximately 24 hours.  Next anesthesia had already decided that he was going to be left intubated and again, I felt this would be the safest route.  NG tube was placed and was confirmed to be in the stomach and an abdominal wound VAC was tailored to fit the abdomen in place.  All sponge, needle, and instrument counts were correct at the end of the case and the patient tolerated the procedure well and was stable and ready for transfer to the intensive care unit for further resuscitation and treatment.          ______________________________ Lodema Pilot, MD     BL/MEDQ  D:  04/17/2012  T:  04/17/2012  Job:  [id number]

## 2012-04-17 NOTE — Consult Note (Signed)
Name: Ryan Hess MRN: [id number] DOB: 12/12/1923    LOS: 1  Referring Provider:  Lilyan Punt, [facility] Reason for Referral:  Post op Vent Dependent Respiratory Failure  PULMONARY / CRITICAL CARE MEDICINE  HPI:  This is an 78 y/o male with COPD, A-fib, CHF, and a prior AAA who was admitted on 7/23 with abdominal pain and a CT scan showing small bowel obstruction.  He underwent an ex-lap on 7/23 and apparently an internal hernia was found and several feet of small bowel was removed.  The current plan is to return to the operating room on 7/25 and he is to remain on the ventilator until then.  PCCM was consulted for vent management.  Past Medical History  Diagnosis Date  . COPD (chronic obstructive pulmonary disease)   . Pneumonia, organism unspecified   . Pulmonary nodule   . HTN (hypertension)   . CHF (congestive heart failure)   . Atrial fibrillation   . Drug therapy   . PVC (premature ventricular contraction)   . PVD (peripheral vascular disease)   . Thoracic aortic aneurysm   . Hypercholesteremia   . Diverticulosis of colon   . Colon polyps   . Prostate cancer   . DJD (degenerative joint disease)   . Actinic skin damage   . Hyperlipidemia   . Internal hemorrhoids    Past Surgical History  Procedure Date  . Total knee arthroplasty     bilateral  . Abdominal aortic aneurysm repair 1998  . Appendectomy   . Inguinal hernia repair     left  . Joint replacement    Prior to Admission medications   Medication Sig Start Date End Date Taking? Authorizing Provider  ADVAIR DISKUS 250-50 MCG/DOSE AEPB Inhale 1 puff into the lungs Twice daily. 11/26/11  Yes Historical Provider, MD  atorvastatin (LIPITOR) 10 MG tablet Take 1 tablet (10 mg total) by mouth daily. 02/13/12  Yes Noralee Space, MD  ferrous sulfate 325 (65 FE) MG tablet Take 325 mg by mouth 2 (two) times daily.   Yes Historical Provider, MD  furosemide (LASIX) 40 MG tablet Take 40 mg by mouth 2 (two) times daily.    Yes  Historical Provider, MD  guaiFENesin (MUCINEX) 600 MG 12 hr tablet Take 600 mg by mouth 2 (two) times daily. COPD.   Yes Historical Provider, MD  Multiple Vitamin (MULTIVITAMIN) tablet Take 1 tablet by mouth daily.     Yes Historical Provider, MD  potassium chloride (K-DUR) 10 MEQ tablet Take 1 tablet (10 mEq total) by mouth 2 (two) times daily. 02/13/12 02/12/13 Yes Noralee Space, MD  Tamsulosin HCl (FLOMAX) 0.4 MG CAPS Take 0.4 mg by mouth at bedtime.    Yes Historical Provider, MD  tiotropium (SPIRIVA HANDIHALER) 18 MCG inhalation capsule Place 1 capsule (18 mcg total) into inhaler and inhale daily. 03/29/12  Yes Noralee Space, MD  aspirin 81 MG tablet Take 81 mg by mouth daily.      Historical Provider, MD  atenolol (TENORMIN) 25 MG tablet take 1 tablet by mouth once daily 03/11/12   Noralee Space, MD  losartan (COZAAR) 100 MG tablet Take 50 mg by mouth daily. 02/13/12 02/12/13  Noralee Space, MD  warfarin (COUMADIN) 1 MG tablet Take 1 mg daily for 3 days 5/17, 5/18, 5/19. INR check 5/20 for dosage adjustment. 02/09/12   Radene Gunning, NP   Allergies No Known Allergies  Family History Family History  Problem Relation Age of Onset  .  Dementia Mother   . Stroke Father   . Diabetes Brother   . Heart disease Brother   . Heart disease Sister   . Heart disease Brother   . Obesity Brother   . Cancer Sister     back ??  . Alzheimer's disease Sister   . Colon cancer Neg Hx    Social History  reports that he quit smoking about 23 years ago. His smoking use included Cigarettes. He has a 88 pack-year smoking history. He has never used smokeless tobacco. He reports that he does not drink alcohol or use illicit drugs.  Review Of Systems:  Cannot obtain due to intubation  Brief patient description:  78 y/o male with CHF, COPD, A-fib, and vascular disease who was admitted on 7/23 with a small bowel obstruction believed to be related to an intraperitoneal hernia.  He underwent an ex-lap on 7/23 and will  return to the OR on 7/25.    Events Since Admission: 7/23 CT abdomen>> large amount of small bowel obstructed with surrounding free fluid, aneursymal thoracic and abdominal aorta  Current Status:  Vital Signs: Pulse Rate:  [40-116] 60  (07/23 2230) Resp:  [14-34] 15  (07/24 0125) BP: (109-161)/(59-113) 120/63 mmHg (07/23 2230) SpO2:  [91 %-99 %] 91 % (07/23 2230) FiO2 (%):  [50 %] 50 % (07/24 0125)  Physical Examination: Gen: sedated on vent HEENT: NCAT, EOMi, ETT in place PULM: CTA B CV: Irreg Irreg, no mgr, no JVD AB: no bowel sounds, wound vac in place Ext: cool feet, good cap refill, PT pulses heard by doppler bilaterally, Derm: no rash or skin breakdown Neuro: sedated on vent, follows simple commands   Principal Problem:  *Small bowel obstruction Active Problems:  Atrial fibrillation  Respiratory failure, post-operative  COPD (chronic obstructive pulmonary disease)  CHF (congestive heart failure)   ASSESSMENT AND PLAN  PULMONARY No results found for this basename: PHART:5,PCO2:5,PCO2ART:5,PO2ART:5,HCO3:5,O2SAT:5 in the last 168 hours Ventilator Settings: Vent Mode:  [-] PRVC FiO2 (%):  [50 %] 50 % Set Rate:  [15 bmp] 15 bmp Vt Set:  [600 mL] 600 mL PEEP:  [5 cmH20] 5 cmH20 Plateau Pressure:  [16 cmH20] 16 cmH20 CXR:  ETT in place, no acute process ETT:  7/23 >>  A: Post Operative Respiratory failure and COPD, no evidence of acute flare; P:   -full vent support until after second surgery 7/25 -ABG now -daily CXR/ABG -WUA/SBT after 7/25 surgery -combivent scheduled -restart tio/advair after extubated  CARDIOVASCULAR  Lab 04/16/12 1612  TROPONINI --  LATICACIDVEN 1.8  PROBNP --   ECG:  Atrial fib, LAD, no ST wave changes Lines: 7/24 L IJ CVL >>  A: CHF (12/2011 LVEF 45%, diffuse hypokinesis, LVH), no acute exacerbation A-fib with RVR in OR Aortic aneurysm, s/p thoracic stent; evaluated by vascular surgery on 7/23, per consult no evidence of acute  vascular compromise based on CT abdomen P:  -monitor volume status closely -tele monitoring -continue dilt gtt for now, convert to po atenolol (home med) when able to take po -monitor pulses in legs -consider restart warfarin prior to discharge (had been held by urology for hematuria with UTI's)  RENAL  Lab 04/16/12 1612  NA 141  K 3.9  CL 104  CO2 23  BUN 18  CREATININE 1.11  CALCIUM 9.1  MG --  PHOS --   Intake/Output      07/23 0701 - 07/24 0700   I.V. 1500   IV Piggyback 500   Total Intake  2000   Urine 50   Blood 200   Total Output 250   Net +1750        Foley:  7/23  A:  No acute issues P:   -monitor UOP -BMET daily  GASTROINTESTINAL  Lab 04/16/12 1612  AST 27  ALT 14  ALKPHOS 141*  BILITOT 1.6*  PROT 6.6  ALBUMIN 3.6    A:  Small bowel incarcerated internal hernia; currently npo P:   -per surgery  HEMATOLOGIC  Lab 04/16/12 2044 04/16/12 1612  HGB -- 12.6*  HCT -- 39.7  PLT -- 159  INR 1.24 --  APTT 31 --   A:  No acute issues P:  -monitor for bleeding, cbc  INFECTIOUS  Lab 04/16/12 1612  WBC 7.9  PROCALCITON --   Cultures:  Antibiotics: 7/23 ertapenem >>  A:  Incarcerated hernia, s/p resection; currently not septic;  P:   -ertapenem per general surgery  ENDOCRINE No results found for this basename: GLUCAP:5 in the last 168 hours A:   No acute issues P:   -monitor glucose  NEUROLOGIC  A:  Post op pain, needs sedation for vent P:   -keep sedation light, intermittent sedation protocol, target RASS -1 -re-orient frequently  BEST PRACTICE / DISPOSITION Level of Care:  ICU Primary Service:  [facility] Consultants:  PCCM Code Status:  No CPR or shocks, pressors, vent OK Diet:  npo DVT Px:  scd GI Px:  ppi Skin Integrity:  Normal on admission Social / Family:  Updated 7/24 early AM  CC time 60 minutes  Degan Hanser, M.D. Pulmonary and [place] Pager: [phone]  04/17/2012,  2:34 AM

## 2012-04-18 ENCOUNTER — Encounter (HOSPITAL_COMMUNITY): Admission: EM | Disposition: A | Discharge status: Home / Self Care | Payer: Self-pay | Source: Home / Self Care

## 2012-04-18 ENCOUNTER — Inpatient Hospital Stay (HOSPITAL_COMMUNITY): Payer: Medicare Other | Admitting: Anesthesiology

## 2012-04-18 ENCOUNTER — Encounter (HOSPITAL_COMMUNITY): Payer: Self-pay | Admitting: Anesthesiology

## 2012-04-18 ENCOUNTER — Inpatient Hospital Stay (HOSPITAL_COMMUNITY): Payer: Medicare Other

## 2012-04-18 DIAGNOSIS — G9340 Encephalopathy, unspecified: Secondary | ICD-10-CM

## 2012-04-18 HISTORY — PX: APPLICATION OF WOUND VAC: SHX5189

## 2012-04-18 HISTORY — PX: LAPAROTOMY: SHX154

## 2012-04-18 LAB — GLUCOSE, CAPILLARY
Glucose-Capillary: 106 mg/dL — ABNORMAL HIGH (ref 70–99)
Glucose-Capillary: 91 mg/dL (ref 70–99)
Glucose-Capillary: 93 mg/dL (ref 70–99)
Glucose-Capillary: 95 mg/dL (ref 70–99)
Glucose-Capillary: 97 mg/dL (ref 70–99)
Glucose-Capillary: 98 mg/dL (ref 70–99)

## 2012-04-18 SURGERY — LAPAROTOMY, EXPLORATORY
Anesthesia: General | Site: Abdomen | Wound class: Clean Contaminated

## 2012-04-18 MED ORDER — MIDAZOLAM HCL 5 MG/5ML IJ SOLN
INTRAMUSCULAR | Status: DC | PRN
  Administered 2012-04-18 (×2): 2 mg via INTRAVENOUS

## 2012-04-18 MED ORDER — ROCURONIUM BROMIDE 100 MG/10ML IV SOLN
INTRAVENOUS | Status: DC | PRN
  Administered 2012-04-18: 50 mg via INTRAVENOUS

## 2012-04-18 MED ORDER — IPRATROPIUM-ALBUTEROL 18-103 MCG/ACT IN AERO
2.0000 | INHALATION_SPRAY | Freq: Four times a day (QID) | RESPIRATORY_TRACT | Status: DC
  Administered 2012-04-18 – 2012-04-23 (×21): 2 via RESPIRATORY_TRACT

## 2012-04-18 MED ORDER — VECURONIUM BROMIDE 10 MG IV SOLR
INTRAVENOUS | Status: DC | PRN
  Administered 2012-04-18: 4 mg via INTRAVENOUS

## 2012-04-18 MED ORDER — FENTANYL CITRATE 0.05 MG/ML IJ SOLN
INTRAMUSCULAR | Status: DC | PRN
  Administered 2012-04-18: 100 ug via INTRAVENOUS
  Administered 2012-04-18: 150 ug via INTRAVENOUS

## 2012-04-18 MED ORDER — LACTATED RINGERS IV SOLN
INTRAVENOUS | Status: DC | PRN
  Administered 2012-04-18: 12:00:00 via INTRAVENOUS

## 2012-04-18 MED ORDER — METOPROLOL TARTRATE 1 MG/ML IV SOLN
5.0000 mg | Freq: Four times a day (QID) | INTRAVENOUS | Status: DC
  Administered 2012-04-18 – 2012-04-24 (×23): 5 mg via INTRAVENOUS
  Filled 2012-04-18 (×29): qty 5

## 2012-04-18 MED ORDER — 0.9 % SODIUM CHLORIDE (POUR BTL) OPTIME
TOPICAL | Status: DC | PRN
  Administered 2012-04-18: 2000 mL

## 2012-04-18 SURGICAL SUPPLY — 43 items
BLADE SURG ROTATE 9660 (MISCELLANEOUS) IMPLANT
CANISTER SUCTION 2500CC (MISCELLANEOUS) ×2 IMPLANT
CANISTER WOUND CARE 500ML ATS (WOUND CARE) ×1 IMPLANT
CHLORAPREP W/TINT 26ML (MISCELLANEOUS) ×2 IMPLANT
CLOTH BEACON ORANGE TIMEOUT ST (SAFETY) ×2 IMPLANT
COVER SURGICAL LIGHT HANDLE (MISCELLANEOUS) ×2 IMPLANT
DRAPE LAPAROSCOPIC ABDOMINAL (DRAPES) ×2 IMPLANT
DRAPE UTILITY 15X26 W/TAPE STR (DRAPE) ×4 IMPLANT
DRAPE WARM FLUID 44X44 (DRAPE) ×2 IMPLANT
DRSG VAC ATS LRG SENSATRAC (GAUZE/BANDAGES/DRESSINGS) ×1 IMPLANT
ELECT REM PT RETURN 9FT ADLT (ELECTROSURGICAL) ×2
ELECTRODE REM PT RTRN 9FT ADLT (ELECTROSURGICAL) ×1 IMPLANT
GLOVE BIO SURGEON STRL SZ7.5 (GLOVE) ×2 IMPLANT
GLOVE BIO SURGEON STRL SZ8 (GLOVE) ×3 IMPLANT
GLOVE BIOGEL PI IND STRL 8 (GLOVE) ×1 IMPLANT
GLOVE BIOGEL PI INDICATOR 8 (GLOVE) ×2
GOWN STRL NON-REIN LRG LVL3 (GOWN DISPOSABLE) ×4 IMPLANT
KIT BASIN OR (CUSTOM PROCEDURE TRAY) ×2 IMPLANT
KIT ROOM TURNOVER OR (KITS) ×2 IMPLANT
LIGASURE IMPACT 36 18CM CVD LR (INSTRUMENTS) IMPLANT
NS IRRIG 1000ML POUR BTL (IV SOLUTION) ×2 IMPLANT
PACK GENERAL/GYN (CUSTOM PROCEDURE TRAY) ×2 IMPLANT
PAD ARMBOARD 7.5X6 YLW CONV (MISCELLANEOUS) ×4 IMPLANT
RELOAD PROXIMATE 75MM BLUE (ENDOMECHANICALS) ×4 IMPLANT
RELOAD STAPLE 75 3.8 BLU REG (ENDOMECHANICALS) IMPLANT
SPECIMEN JAR LARGE (MISCELLANEOUS) IMPLANT
SPONGE GAUZE 4X4 12PLY (GAUZE/BANDAGES/DRESSINGS) ×2 IMPLANT
SPONGE LAP 18X18 X RAY DECT (DISPOSABLE) IMPLANT
STAPLER GUN LINEAR PROX 60 (STAPLE) ×1 IMPLANT
STAPLER PROXIMATE 75MM BLUE (STAPLE) ×1 IMPLANT
STAPLER VISISTAT 35W (STAPLE) ×3 IMPLANT
SUCTION POOLE TIP (SUCTIONS) IMPLANT
SUT NOVA 1 T20/GS 25DT (SUTURE) ×1 IMPLANT
SUT PDS AB 1 CTX 36 (SUTURE) ×1 IMPLANT
SUT PDS AB 1 TP1 96 (SUTURE) ×1 IMPLANT
SUT VIC AB 2-0 SH 18 (SUTURE) ×2 IMPLANT
SUT VIC AB 3-0 SH 18 (SUTURE) ×2 IMPLANT
SUT VICRYL AB 3 0 TIES (SUTURE) ×2 IMPLANT
TOWEL OR 17X24 6PK STRL BLUE (TOWEL DISPOSABLE) ×2 IMPLANT
TOWEL OR 17X26 10 PK STRL BLUE (TOWEL DISPOSABLE) ×2 IMPLANT
TRAY FOLEY CATH 14FRSI W/METER (CATHETERS) IMPLANT
WATER STERILE IRR 1000ML POUR (IV SOLUTION) ×2 IMPLANT
YANKAUER SUCT BULB TIP NO VENT (SUCTIONS) IMPLANT

## 2012-04-18 NOTE — Anesthesia Postprocedure Evaluation (Signed)
  Anesthesia Post-op Note  Patient: Ryan Hess  Procedure(s) Performed: Procedure(s) (LRB): EXPLORATORY LAPAROTOMY (N/A) APPLICATION OF WOUND VAC (N/A)  Patient Location: SICU  Anesthesia Type: General  Level of Consciousness: sedated  Airway and Oxygen Therapy: Patient remains intubated per anesthesia plan and Patient placed on Ventilator (see vital sign flow sheet for setting)  Post-op Pain: none  Post-op Assessment: Patient's Cardiovascular Status Stable, Respiratory Function Stable, Patent Airway and No signs of Nausea or vomiting  Post-op Vital Signs: Reviewed and stable  Complications: No apparent anesthesia complications

## 2012-04-18 NOTE — Anesthesia Postprocedure Evaluation (Signed)
  Anesthesia Post-op Note  Patient: Ryan Hess  Procedure(s) Performed: Procedure(s) (LRB): EXPLORATORY LAPAROTOMY (N/A) APPLICATION OF WOUND VAC (N/A)  Patient Location: SICU  Anesthesia Type: General  Level of Consciousness: Sedated on vent  Airway and Oxygen Therapy: Remains intubated per anesthesia Post-op Pain: mild  Post-op Assessment: Post-op Vital signs reviewed and Patient's Cardiovascular Status Stable  Post-op Vital Signs: stable  Complications: No apparent anesthesia complications

## 2012-04-18 NOTE — Interval H&P Note (Signed)
History and Physical Interval Note:  04/18/2012 11:43 AM  Ryan Hess  has presented today for surgery, with the diagnosis of open abdomen  The various methods of treatment have been discussed with the patient and family. After consideration of risks, benefits and other options for treatment, the patient has consented to  Procedure(s) (LRB): EXPLORATORY LAPAROTOMY (N/A) as a surgical intervention .  The patient's history has been reviewed, patient examined, no change in status, stable for surgery.  I have reviewed the patient's chart and labs.  Questions were answered to the patient's satisfaction.     [name]

## 2012-04-18 NOTE — Anesthesia Preprocedure Evaluation (Signed)
Anesthesia Evaluation  Patient identified by MRN, date of birth, ID band Patient awake    Reviewed: Allergy & Precautions, H&P , NPO status , Patient's Chart, lab work & pertinent test results, reviewed documented beta blocker date and time   Airway       Dental   Pulmonary  breath sounds clear to auscultation        Cardiovascular Rhythm:Regular Rate:Normal     Neuro/Psych    GI/Hepatic   Endo/Other    Renal/GU      Musculoskeletal   Abdominal   Peds  Hematology   Anesthesia Other Findings   Reproductive/Obstetrics                           Anesthesia Physical Anesthesia Plan  ASA: III  Anesthesia Plan: General   Post-op Pain Management:    Induction: Intravenous  Airway Management Planned: Oral ETT  Additional Equipment:   Intra-op Plan:   Post-operative Plan: Post-operative intubation/ventilation  Informed Consent: I have reviewed the patients History and Physical, chart, labs and discussed the procedure including the risks, benefits and alternatives for the proposed anesthesia with the patient or authorized representative who has indicated his/her understanding and acceptance.     Plan Discussed with: CRNA and Surgeon  Anesthesia Plan Comments:         Anesthesia Quick Evaluation

## 2012-04-18 NOTE — Op Note (Signed)
Preoperative diagnosis: Open abdomen status post small bowel resection  Postop diagnosis: Same  Procedure: Exploratory laparotomy with creation of small bowel anastomosis and closure of abdomen  Surgeon: Harriette Bouillon M.D.  Assistant: Violeta Gelinas M.D.  Anesthesia: Gen. Endotracheal anesthesia  Specimen: None  EBL: 10 cc  Drains: None  IV fluids: 1500 cc crystalloid  Indications for procedure: The patient presents from the ICU with an open abdomen secondary to exploratory laparotomy 2 days ago and small bowel resection new ischemia. He has remained stable and returns for second-look operation and possible closure of the abdomen with the anastomosis of the small bowel. I discussed the case with his family since he is intubated and they agree for [place] to proceed the operating room. Risks, benefits and alternative therapies discussed. Risk of bleeding, infection, death, worsening of previous condition, the need for further operations, organ injury, and long-term sequela of hernia or potential complications.  Description of procedure: Patient brought back to the intensive care intubated.he was placed upon the operating room table. Wound VAC was removed and the abdomen was prepped and draped in sterile fashion. Timeout was done. Small bowel was examined. It was run from ligament of Treitz to the mid ileum were stable where it was stapled off. The distal ileum was identified and a side-to-side functional end anastomosis performed since the bowel was viable. This was done the GIA 75 stapler and TA 60 to close the common enterotomy. Mesentery oversewn with 2-0 Vicryl to close the mesenteric defect. No evidence of tension in the anastomosis was widely patent and viable no signs of bleeding upon inspection. The abdominal cavity is irrigated. The gallbladder was distended without stones. Liver stomach grossly normal. A sitting transverse descending colon normal. No evidence of bowel twisting. We then  closed the abdominal cavity with interrupted #1 Novafil in running #1 PDS suture. Wound VAC placed. All final counts sponge, needle and this was found to be correct. Patient was taken back to ICU in stable condition.

## 2012-04-18 NOTE — Transfer of Care (Signed)
Immediate Anesthesia Transfer of Care Note  Patient: Ryan Hess  Procedure(s) Performed: Procedure(s) (LRB): EXPLORATORY LAPAROTOMY (N/A) APPLICATION OF WOUND VAC (N/A)  Patient Location: SICU  Anesthesia Type: General  Level of Consciousness: sedated  Airway & Oxygen Therapy: Patient remains intubated per anesthesia plan and Patient placed on Ventilator (see vital sign flow sheet for setting)  Post-op Assessment: Report given to PACU RN and Post -op Vital signs reviewed and stable  Post vital signs: Reviewed and stable  Complications: No apparent anesthesia complications

## 2012-04-18 NOTE — Preoperative (Signed)
Beta Blockers   Reason not to administer Beta Blockers:Not Applicable 

## 2012-04-18 NOTE — H&P (View-Only) (Signed)
2 Days Post-Op  Subjective: Pt intubated awake alert.  Objective: Vital signs in last 24 hours: Temp:  [97.8 F (36.6 C)-98.6 F (37 C)] 97.8 F (36.6 C) (07/25 0350) Pulse Rate:  [37-117] 57  (07/25 0700) Resp:  [16-29] 16  (07/25 0700) BP: (77-125)/(46-81) 118/65 mmHg (07/25 0700) SpO2:  [99 %-100 %] 100 % (07/25 0700) Arterial Line BP: (87-126)/(51-108) 109/100 mmHg (07/25 0700) FiO2 (%):  [40 %-50 %] 40 % (07/25 0400) Weight:  [181 lb 3.5 oz (82.2 kg)] 181 lb 3.5 oz (82.2 kg) (07/25 0200)    Intake/Output from previous day: 07/24 0701 - 07/25 0700 In: 3545 [I.V.:3355; NG/GT:180; IV Piggyback:10] Out: 2535 [Urine:585; Emesis/NG output:950; Drains:1000] Intake/Output this shift:    Incision/Wound:vac in place soft non distended  Lab Results:   Basename 04/17/12 0230 04/17/12 0112 04/16/12 1612  WBC 11.8* -- 7.9  HGB 10.5* 10.9* --  HCT 33.2* 32.0* --  PLT 139* -- 159   BMET  Basename 04/17/12 0230 04/17/12 0112 04/16/12 1612  NA 142 142 --  K 4.7 5.1 --  CL 108 -- 104  CO2 21 -- 23  GLUCOSE 136* -- 131*  BUN 20 -- 18  CREATININE 1.17 -- 1.11  CALCIUM 8.4 -- 9.1   PT/INR  Basename 04/16/12 2044  LABPROT 15.9*  INR 1.24   ABG  Basename 04/17/12 0308 04/17/12 0112  PHART 7.350 7.387  HCO3 23.5 23.7    Studies/Results: Ct Abdomen Pelvis W Contrast  04/16/2012  *RADIOLOGY REPORT*  Clinical Data: Acute onset of nausea, vomiting, diarrhea and right upper abdominal pain.  Palpable right upper quadrant abdominal mass by exam.  CT ABDOMEN AND PELVIS WITH CONTRAST  Technique:  Multidetector CT imaging of the abdomen and pelvis was performed following the standard protocol during bolus administration of intravenous contrast.  Contrast: 80mL OMNIPAQUE IOHEXOL 300 MG/ML  SOLN  Comparison: 02/03/2012  Findings: Multiple dilated and fluid-filled small bowel loops localize to the right side of the peritoneal cavity.  These loops are visibly thickened and hyperemic and  show significant surrounding free fluid.  At least one inferior loop demonstrates probable pneumatosis.  There is no evidence of bowel perforation. These bowel loops converge into a central vascular pedicle and findings may reflect some type of volvulus or internal hernia.  It is also possible that this may be secondary to an adhesion. Maximal caliber of dilated small bowel is approximately 3.3 cm.  Moderate free fluid extends up to surround the liver and spleen. There also is some free fluid in both pericolic gutters and the pelvis.  No evidence of focal abscess.  Other small bowel loops and the colon are nondilated.  Within the visualized lower chest and upper abdomen, the inferior end of an aortic endograft is visualized which appears to be opposed to the aortic lumen in the lower chest and then shows poor apposition to the aorta at the level of persistent aneurysm in the lower thoracic aorta and upper abdominal aorta at the level of the diaphragmatic hiatus.  Contrast surrounds the endograft distally and the aorta demonstrates maximal diameter of approximately 6 cm. Based on appearance there is at least a type 1 endoleak distally.  Native upper abdominal aorta at the level of the celiac origin demonstrates aneurysmal disease with maximal diameter of 4.9 cm. Below the level of the superior mesenteric artery, there is evidence of prior abdominal aortic aneurysm repair with an aortobifemoral graft present.  There is evidence of a probable reimplanted right renal   artery and proximal stenotic left renal artery.  IMPRESSION:  1.  Acute obstruction of right sided small bowel loops which demonstrate dilatation, wall thickening and surrounding fluid. There may be pneumatosis in one of the inferior dilated small bowel loops and configuration of the obstruction suggests either volvulus, focal internal hernia or significant focal adhesion.  2.  Associated moderate free fluid surrounding dilated small bowel loops as well as  the liver, spleen and extending into the pelvis. No focal abscess. 3.  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  Original Report Authenticated By: GLENN T. YAMAGATA, M.D.   Portable Chest Xray In Am  04/18/2012  *RADIOLOGY REPORT*  Clinical Data: Evaluate lung fields / endotracheal tube positioned  PORTABLE CHEST - 1 VIEW  Comparison: Chest radiograph 04/17/2012  Findings: Endotracheal tube and NG tube unchanged.  Thoracic aortic stent is expanded.  Left central venous line unchanged.  Stable enlarged heart silhouette.  There is air space disease and in the lower lobes greater on the left.  IMPRESSION:  1.  Stable support apparatus. 2.  No significant change. 3.  Low lung volumes and basilar air space disease.  Original Report Authenticated By: STEWART EDMUNDS, M.D.   Dg Chest Port 1 View  04/17/2012  *RADIOLOGY REPORT*  Clinical Data: Central line positioning.  PORTABLE CHEST - 1 VIEW  Comparison: 04/17/2012  Findings: Left IJ line tip projects over the upper SVC.  No pneumothorax observed.  Endotracheal tube tip is 4.9 cm above the carina.  Nasogastric tube extends into the stomach.  Tortuous thoracic aorta noted with descending aortic stent graft.  Continued interstitial accentuation noted with lingular scarring and retrocardiac opacity on the left.  IMPRESSION:  1.  Left IJ line tip projects over the upper SVC.  No pneumothorax. Otherwise stable.  Original Report Authenticated By: WALTER D. LIEBKEMANN, M.D.   Portable Chest Xray  04/17/2012  *RADIOLOGY REPORT*  Clinical Data: Status post intubation.  PORTABLE CHEST - 1 VIEW  Comparison: 03/22/2012  Findings: Endotracheal tube present with the tip approximately 4 cm above the carina.  Lungs show bibasilar atelectasis and chronic lung disease.  Thoracic endograft again visualized.  The heart is mildly enlarged.  No overt  pulmonary edema, focal airspace consolidation or pleural fluid identified.  IMPRESSION: Endotracheal tube tip approximately 4 cm above the carina.  Original Report Authenticated By: GLENN T. YAMAGATA, M.D.    Anti-infectives: Anti-infectives     Start     Dose/Rate Route Frequency Ordered Stop   04/17/12 1040   ceFAZolin (ANCEF) 3 g in dextrose 5 % 50 mL IVPB        3 g 160 mL/hr over 30 Minutes Intravenous 60 min pre-op 04/17/12 1040     04/16/12 2345   ertapenem (INVANZ) 1 g in sodium chloride 0.9 % 50 mL IVPB        1 g 100 mL/hr over 30 Minutes Intravenous  Once 04/16/12 2340            Assessment/Plan: s/p Procedure(s) (LRB): EXPLORATORY LAPAROTOMY (N/A) To OR for closure today and re anastamosis.  LOS: 2 days    Camarie Mctigue A. 04/18/2012  

## 2012-04-18 NOTE — Procedures (Signed)
Extubation Procedure Note  Patient Details:   Name: Ryan Hess DOB: 09-Feb-1924 MRN: [id number]   Airway Documentation:  Airway 8 mm (Active)  Secured at (cm) 23 cm 04/18/2012 11:22 AM  Measured From Lips 04/18/2012 11:22 AM  Secured Location Center 04/18/2012 11:22 AM  Secured By [employer] 04/18/2012 11:22 AM  Tube Holder Repositioned Yes 04/18/2012 11:22 AM  Cuff Pressure (cm H2O) 24 cm H2O 04/18/2012 11:22 AM  Site Condition Dry 04/18/2012 11:22 AM    Evaluation  O2 sats: stable throughout Complications: No apparent complications Patient did tolerate procedure well. Bilateral Breath Sounds: Clear;Diminished Suctioning: Airway Yes  Pt tolerated SBT, positive for cuff leak, and extubated to 4lpm [place] per MD order. Pt resting comfortably on 4lpm [place], all vitals are stable at this time. RT will continue to monitor.   Parke Poisson 04/18/2012, 3:30 PM

## 2012-04-18 NOTE — Progress Notes (Signed)
2 Days Post-Op  Subjective: Pt intubated awake alert.  Objective: Vital signs in last 24 hours: Temp:  [97.8 F (36.6 C)-98.6 F (37 C)] 97.8 F (36.6 C) (07/25 0350) Pulse Rate:  [37-117] 57  (07/25 0700) Resp:  [16-29] 16  (07/25 0700) BP: (77-125)/(46-81) 118/65 mmHg (07/25 0700) SpO2:  [99 %-100 %] 100 % (07/25 0700) Arterial Line BP: (87-126)/(51-108) 109/100 mmHg (07/25 0700) FiO2 (%):  [40 %-50 %] 40 % (07/25 0400) Weight:  [181 lb 3.5 oz (82.2 kg)] 181 lb 3.5 oz (82.2 kg) (07/25 0200)    Intake/Output from previous day: 07/24 0701 - 07/25 0700 In: 3545 [I.V.:3355; NG/GT:180; IV Piggyback:10] Out: 2535 [Urine:585; Emesis/NG output:950; Drains:1000] Intake/Output this shift:    Incision/Wound:vac in place soft non distended  Lab Results:   Basename 04/17/12 0230 04/17/12 0112 04/16/12 1612  WBC 11.8* -- 7.9  HGB 10.5* 10.9* --  HCT 33.2* 32.0* --  PLT 139* -- 159   BMET  Basename 04/17/12 0230 04/17/12 0112 04/16/12 1612  NA 142 142 --  K 4.7 5.1 --  CL 108 -- 104  CO2 21 -- 23  GLUCOSE 136* -- 131*  BUN 20 -- 18  CREATININE 1.17 -- 1.11  CALCIUM 8.4 -- 9.1   PT/INR  Basename 04/16/12 2044  LABPROT 15.9*  INR 1.24   ABG  Basename 04/17/12 0308 04/17/12 0112  PHART 7.350 7.387  HCO3 23.5 23.7    Studies/Results: Ct Abdomen Pelvis W Contrast  04/16/2012  *RADIOLOGY REPORT*  Clinical Data: Acute onset of nausea, vomiting, diarrhea and right upper abdominal pain.  Palpable right upper quadrant abdominal mass by exam.  CT ABDOMEN AND PELVIS WITH CONTRAST  Technique:  Multidetector CT imaging of the abdomen and pelvis was performed following the standard protocol during bolus administration of intravenous contrast.  Contrast: 80mL OMNIPAQUE IOHEXOL 300 MG/ML  SOLN  Comparison: 02/03/2012  Findings: Multiple dilated and fluid-filled small bowel loops localize to the right side of the peritoneal cavity.  These loops are visibly thickened and hyperemic and  show significant surrounding free fluid.  At least one inferior loop demonstrates probable pneumatosis.  There is no evidence of bowel perforation. These bowel loops converge into a central vascular pedicle and findings may reflect some type of volvulus or internal hernia.  It is also possible that this may be secondary to an adhesion. Maximal caliber of dilated small bowel is approximately 3.3 cm.  Moderate free fluid extends up to surround the liver and spleen. There also is some free fluid in both pericolic gutters and the pelvis.  No evidence of focal abscess.  Other small bowel loops and the colon are nondilated.  Within the visualized lower chest and upper abdomen, the inferior end of an aortic endograft is visualized which appears to be opposed to the aortic lumen in the lower chest and then shows poor apposition to the aorta at the level of persistent aneurysm in the lower thoracic aorta and upper abdominal aorta at the level of the diaphragmatic hiatus.  Contrast surrounds the endograft distally and the aorta demonstrates maximal diameter of approximately 6 cm. Based on appearance there is at least a type 1 endoleak distally.  Native upper abdominal aorta at the level of the celiac origin demonstrates aneurysmal disease with maximal diameter of 4.9 cm. Below the level of the superior mesenteric artery, there is evidence of prior abdominal aortic aneurysm repair with an aortobifemoral graft present.  There is evidence of a probable reimplanted right renal  artery and proximal stenotic left renal artery.  IMPRESSION:  1.  Acute obstruction of right sided small bowel loops which demonstrate dilatation, wall thickening and surrounding fluid. There may be pneumatosis in one of the inferior dilated small bowel loops and configuration of the obstruction suggests either volvulus, focal internal hernia or significant focal adhesion.  2.  Associated moderate free fluid surrounding dilated small bowel loops as well as  the liver, spleen and extending into the pelvis. No focal abscess. 3.  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  Original Report Authenticated By: Reola Calkins, M.D.   Portable Chest Xray In Am  04/18/2012  *RADIOLOGY REPORT*  Clinical Data: Evaluate lung fields / endotracheal tube positioned  PORTABLE CHEST - 1 VIEW  Comparison: Chest radiograph 04/17/2012  Findings: Endotracheal tube and NG tube unchanged.  Thoracic aortic stent is expanded.  Left central venous line unchanged.  Stable enlarged heart silhouette.  There is air space disease and in the lower lobes greater on the left.  IMPRESSION:  1.  Stable support apparatus. 2.  No significant change. 3.  Low lung volumes and basilar air space disease.  Original Report Authenticated By: Genevive Bi, M.D.   Dg Chest Port 1 View  04/17/2012  *RADIOLOGY REPORT*  Clinical Data: Central line positioning.  PORTABLE CHEST - 1 VIEW  Comparison: 04/17/2012  Findings: Left IJ line tip projects over the upper SVC.  No pneumothorax observed.  Endotracheal tube tip is 4.9 cm above the carina.  Nasogastric tube extends into the stomach.  Tortuous thoracic aorta noted with descending aortic stent graft.  Continued interstitial accentuation noted with lingular scarring and retrocardiac opacity on the left.  IMPRESSION:  1.  Left IJ line tip projects over the upper SVC.  No pneumothorax. Otherwise stable.  Original Report Authenticated By: Dellia Cloud, M.D.   Portable Chest Xray  04/17/2012  *RADIOLOGY REPORT*  Clinical Data: Status post intubation.  PORTABLE CHEST - 1 VIEW  Comparison: 03/22/2012  Findings: Endotracheal tube present with the tip approximately 4 cm above the carina.  Lungs show bibasilar atelectasis and chronic lung disease.  Thoracic endograft again visualized.  The heart is mildly enlarged.  No overt  pulmonary edema, focal airspace consolidation or pleural fluid identified.  IMPRESSION: Endotracheal tube tip approximately 4 cm above the carina.  Original Report Authenticated By: Reola Calkins, M.D.    Anti-infectives: Anti-infectives     Start     Dose/Rate Route Frequency Ordered Stop   04/17/12 1040   ceFAZolin (ANCEF) 3 g in dextrose 5 % 50 mL IVPB        3 g 160 mL/hr over 30 Minutes Intravenous 60 min pre-op 04/17/12 1040     04/16/12 2345   ertapenem (INVANZ) 1 g in sodium chloride 0.9 % 50 mL IVPB        1 g 100 mL/hr over 30 Minutes Intravenous  Once 04/16/12 2340            Assessment/Plan: s/p Procedure(s) (LRB): EXPLORATORY LAPAROTOMY (N/A) To OR for closure today and re anastamosis.  LOS: 2 days    Angala Hilgers A. 04/18/2012

## 2012-04-18 NOTE — Consult Note (Signed)
Name: Ryan Hess MRN: [id number] DOB: 08/08/1924    LOS: 2  Referring Provider:  Biagio Quint, CCS Reason for Referral:  Post op Vent Dependent Respiratory Failure  PULMONARY / CRITICAL CARE MEDICINE  Brief patient description:  78 y/o male with CHF, COPD, A-fib, and vascular disease who was admitted on 7/23 with a small bowel obstruction believed to be related to an intraperitoneal hernia.  He underwent an ex-lap on 7/23 and will return to the OR on 7/25.    Events Since Admission: 7/23  Exploratory lap, bowel resection, open abdomen 7/25  Abdomen closed  Current Status:  No overnight issues.  In OR this am for anastomosis and abdominal closure   Vital Signs: Temp:  [97.6 F (36.4 C)-98.3 F (36.8 C)] 97.6 F (36.4 C) (07/25 1330) Pulse Rate:  [37-105] 58  (07/25 1400) Resp:  [15-29] 16  (07/25 1400) BP: (88-125)/(50-81) 125/79 mmHg (07/25 1400) SpO2:  [99 %-100 %] 100 % (07/25 1402) Arterial Line BP: (88-137)/(54-108) 137/78 mmHg (07/25 1400) FiO2 (%):  [40 %-50 %] 40 % (07/25 1402) Weight:  [82.2 kg (181 lb 3.5 oz)] 82.2 kg (181 lb 3.5 oz) (07/25 0200)  Physical Examination: Gen: No distress HEENT: PERRL PULM: CTAB CV: Irregular heart rate AB: Surgical dressing intact, no bowel sounds Ext: No edema Derm: No rash Neuro: Opens eyes spontaneously, follows commands  Principal Problem:  *Small bowel obstruction Active Problems:  Atrial fibrillation  Respiratory failure, post-operative  COPD (chronic obstructive pulmonary disease)  CHF (congestive heart failure)  ASSESSMENT AND PLAN  PULMONARY  Lab 04/17/12 0308 04/17/12 0112  PHART 7.350 7.387  PCO2ART 42.2 38.8  PO2ART 148.0* 386.0*  HCO3 23.5 23.7  O2SAT 99.0 100.0   Ventilator Settings: Vent Mode:  [-] PRVC FiO2 (%):  [40 %-50 %] 40 % Set Rate:  [15 bmp] 15 bmp Vt Set:  [600 mL] 600 mL PEEP:  [5 cmH20] 5 cmH20 Plateau Pressure:  [13 cmH20-17 cmH20] 15 cmH20  CXR:  7/25 >>> Stable hardware, small  volumes, bibasilar atelectasis ETT:  7/23 >>  A: Post Operative Respiratory failure and COPD, no evidence of acute flare. P:   SBT with intent to extubate Combivent Restart Spiriva / Advair when extubated  CARDIOVASCULAR  Lab 04/16/12 1612  TROPONINI --  LATICACIDVEN 1.8  PROBNP --   ECG:  Atrial fib, LAD, no ST wave changes Lines: 7/24 L IJ CVL >>  A: CHF (12/2011 LVEF 45%, diffuse hypokinesis, LVH), no acute exacerbation. A-fib with RVR. Aortic aneurysm, s/p thoracic stent; evaluated by vascular surgery on 7/23, per consult no evidence of acute vascular compromise based on CT abdomen. P:  Continue Cardizem Add Metoprolol 5 mg IV q6h Reevaluate for systemic anticoagulation before discharge  RENAL  Lab 04/17/12 0230 04/17/12 0112 04/16/12 1612  NA 142 142 141  K 4.7 5.1 --  CL 108 -- 104  CO2 21 -- 23  BUN 20 -- 18  CREATININE 1.17 -- 1.11  CALCIUM 8.4 -- 9.1  MG 2.0 -- --  PHOS -- -- --   Intake/Output      07/24 0701 - 07/25 0700 07/25 0701 - 07/26 0700   I.V. (mL/kg) 3355 (40.8) 1225 (14.9)   NG/GT 180 60   IV Piggyback 10    Total Intake(mL/kg) 3545 (43.1) 1285 (15.6)   Urine (mL/kg/hr) 585 (0.3) 210 (0.3)   Emesis/NG output 950 0   Drains 1000 0   Blood     Total Output 2535 210  Net +1010 +1075         Foley:  7/23  A:  No acute issues P:   IVF LR 125 mL/h Trend BMP Check Mg with am labs  GASTROINTESTINAL  Lab 04/17/12 0230 04/16/12 1612  AST 24 27  ALT 11 14  ALKPHOS 110 141*  BILITOT 1.9* 1.6*  PROT 5.5* 6.6  ALBUMIN 3.2* 3.6   A:  Small bowel incarcerated internal hernia; currently npo P:   Per Surgery  HEMATOLOGIC  Lab 04/17/12 0230 04/17/12 0112 04/16/12 2044 04/16/12 1612  HGB 10.5* 10.9* -- 12.6*  HCT 33.2* 32.0* -- 39.7  PLT 139* -- -- 159  INR -- -- 1.24 --  APTT -- -- 31 --   A:  No acute issues P:  Trend CBC  INFECTIOUS  Lab 04/17/12 0230 04/16/12 1612  WBC 11.8* 7.9  PROCALCITON -- --   Cultures:   NA  Antibiotics: 7/23 ertapenem >>>  A:  Incarcerated hernia, s/p resection. P:   ABx per surgery  ENDOCRINE  Lab 04/18/12 1132 04/18/12 0750 04/18/12 0409 04/18/12 0005 04/17/12 2030  GLUCAP 91 106* 98 95 95   A:   No acute issues P:   CBG  NEUROLOGIC  A:  Post op pain. P:   Fentanyl PRN for pain Hold Versed as expecting to extubate  BEST PRACTICE / DISPOSITION Level of Care:  ICU Primary Service:  CCS Consultants:  PCCM Code Status:  No CPR or shocks. Pressors, antiarrhythmics and vent OK. Diet:  NPO DVT Px:  SCDs GI Px:  Protonix Skin Integrity:  Normal on admission Social / Family:  Updated this am  The patient is critically ill with multiple organ systems failure and requires high complexity decision making for assessment and support, frequent evaluation and titration of therapies, application of advanced monitoring technologies and extensive interpretation of multiple databases. Critical Care Time devoted to patient care services described in this note is 45 minutes.  Lonia Farber, M.D. Pulmonary and Critical Care Medicine [facility] Pager: [phone]  04/18/2012, 2:57 PM

## 2012-04-18 NOTE — Clinical Documentation Improvement (Signed)
CHF DOCUMENTATION CLARIFICATION QUERY  THIS DOCUMENT IS NOT A PERMANENT PART OF THE MEDICAL RECORD   Please update your documentation within the medical record to reflect your response to this query.                                                                                     04/18/12  Dr. Madelin Headings and/or Associates,  In a better effort to capture your patient's severity of illness, reflect appropriate length of stay and utilization of resources, a review of the patient medical record has revealed the following indicators:  "CHF (12/2011 LVEF 45%, diffuse hypokinesis, LVH), no acute exacerbation" Lonia Farber, M.D.  Pulmonary and Critical Care Medicine  [facility]  Pager: [phone]  04/18/2012, 2:57 PM   01/14/12  Echo - Left ventricle: The cavity size was mildly dilated. Wall thickness was increased in a pattern of mild LVH. The           estimated ejection fraction was 45%. Diffuse hypokinesis. - Aortic valve: Three leaflets with some thickening. There are two AI jets. One is central. The other is from the           commissure betweeen left and right cusps. AI is moderate. - Aorta: Mild dilitation of the ascending aorta. Aortic rootdimension: 36mm (ED). Ascending aortic diameter:           42mm (S). - Mitral valve: Mildly thickened leaflets . Mild regurgitation. - Left atrium: The atrium was severely dilated. - Right ventricle: The cavity size was mildly dilated.  Systolic function was mildly reduced. - Right atrium: The atrium was moderately to severely dilated. - Pulmonary arteries: PA peak pressure: 36mm Hg (S).   Based on your clinical judgment, please document the ACUITY and TYPE of CHF in the progress notes and discharge summary:  ACUITY:  - Acute  - Chronic  - Acute on Chronic  AND  TYPE:  - Systolic  - Diastolic  - Combined In responding to this query please exercise your independent judgment.  The fact that a query is  asked, does not imply that any particular answer is desired or expected.   Reviewed: additional documentation in the medical record  Thank You,  Jerral Ralph  RN BSN CCDS Certified Clinical Documentation Specialist: Cell   [phone]  Health Information Management [place]  TO RESPOND TO THE THIS QUERY, FOLLOW THE INSTRUCTIONS BELOW:  1. If needed, update documentation for the patient's encounter via the notes activity.  2. Access this query again and click edit on the In [employer].  3. After updating, or not, click F2 to complete all highlighted (required) fields concerning your review. Select "additional documentation in the medical record" OR "no additional documentation provided".  4. Click Sign note button.  5. The deficiency will fall out of your In Basket *Please let us know if you are not able to complete this workflow by phone or e-mail (listed below).

## 2012-04-18 NOTE — OR Nursing (Signed)
First Call to 2300 @1305 . Second Call @ 1314.

## 2012-04-19 ENCOUNTER — Encounter (HOSPITAL_COMMUNITY): Payer: Self-pay | Admitting: Surgery

## 2012-04-19 DIAGNOSIS — I5022 Chronic systolic (congestive) heart failure: Secondary | ICD-10-CM

## 2012-04-19 LAB — BASIC METABOLIC PANEL
BUN: 24 mg/dL — ABNORMAL HIGH (ref 6–23)
CO2: 26 mEq/L (ref 19–32)
Calcium: 8.2 mg/dL — ABNORMAL LOW (ref 8.4–10.5)
Chloride: 110 mEq/L (ref 96–112)
Creatinine, Ser: 1.08 mg/dL (ref 0.50–1.35)
GFR calc Af Amer: 70 mL/min — ABNORMAL LOW (ref >90)
GFR calc non Af Amer: 60 mL/min — ABNORMAL LOW (ref >90)
Glucose, Bld: 105 mg/dL — ABNORMAL HIGH (ref 70–99)
Potassium: 4.2 mEq/L (ref 3.5–5.1)
Sodium: 143 mEq/L (ref 135–145)

## 2012-04-19 LAB — CBC
HCT: 27.7 % — ABNORMAL LOW (ref 39.0–52.0)
Hemoglobin: 8.8 g/dL — ABNORMAL LOW (ref 13.0–17.0)
MCH: 30.7 pg (ref 26.0–34.0)
MCHC: 31.8 g/dL (ref 30.0–36.0)
MCV: 96.5 fL (ref 78.0–100.0)
Platelets: 163 10*3/uL (ref 150–400)
RBC: 2.87 MIL/uL — ABNORMAL LOW (ref 4.22–5.81)
RDW: 17 % — ABNORMAL HIGH (ref 11.5–15.5)
WBC: 6.8 10*3/uL (ref 4.0–10.5)

## 2012-04-19 LAB — GLUCOSE, CAPILLARY
Glucose-Capillary: 70 mg/dL (ref 70–99)
Glucose-Capillary: 71 mg/dL (ref 70–99)
Glucose-Capillary: 83 mg/dL (ref 70–99)
Glucose-Capillary: 86 mg/dL (ref 70–99)
Glucose-Capillary: 87 mg/dL (ref 70–99)
Glucose-Capillary: 98 mg/dL (ref 70–99)

## 2012-04-19 LAB — MAGNESIUM: Magnesium: 1.9 mg/dL (ref 1.5–2.5)

## 2012-04-19 MED ORDER — ENOXAPARIN SODIUM 40 MG/0.4ML ~~LOC~~ SOLN
40.0000 mg | SUBCUTANEOUS | Status: DC
  Administered 2012-04-19 – 2012-04-25 (×7): 40 mg via SUBCUTANEOUS
  Filled 2012-04-19 (×8): qty 0.4

## 2012-04-19 MED ORDER — PANTOPRAZOLE SODIUM 40 MG IV SOLR
40.0000 mg | INTRAVENOUS | Status: DC
  Administered 2012-04-19 – 2012-04-23 (×5): 40 mg via INTRAVENOUS
  Filled 2012-04-19 (×7): qty 40

## 2012-04-19 NOTE — Evaluation (Signed)
Occupational Therapy Evaluation Patient Details Name: Ryan Hess MRN: [id number] DOB: [birth year] Today's Date: 04/19/2012 Time: 4098-1191 OT Time Calculation (min): 38 min  OT Assessment / Plan / Recommendation Clinical Impression  Pleasant 78 yr old male admitted for exploratory lap and vac placement to his abdomen.  Currently with increased dependence and decreased mobility for basic selfcare tasks compared to his normal functional level.  Will benefit from acute OT to help increase overall independence with selfcare tasks in order to return home with 24 hour supervision.  Feel he may need HHOT eval if discharged soon but will continue to update with treatment.    OT Assessment  Patient needs continued OT Services    Follow Up Recommendations  Home health OT       Equipment Recommendations  None recommended by OT       Frequency  Min 2X/week    Precautions / Restrictions Precautions Precautions: Fall Precaution Comments: wound vac Restrictions Weight Bearing Restrictions: No   Pertinent Vitals/Pain O2 sats remained 90 % or greater during session on room air.  HR increased to approximately 115 with activity as well.    ADL  Eating/Feeding: Simulated;Set up Where Assessed - Eating/Feeding: Edge of bed Grooming: Simulated;Set up Where Assessed - Grooming: Unsupported sitting Upper Body Bathing: Simulated;Set up Where Assessed - Upper Body Bathing: Unsupported sitting Lower Body Bathing: Simulated;Moderate assistance Where Assessed - Lower Body Bathing: Supported sit to stand Upper Body Dressing: Simulated;Set up Where Assessed - Upper Body Dressing: Unsupported sitting Lower Body Dressing: Simulated;Maximal assistance Where Assessed - Lower Body Dressing: Supported sit to stand Toilet Transfer: Performed Statistician Method: Surveyor, minerals: Materials engineer and Hygiene: Performed;Maximal assistance Where  Assessed - Engineer, mining and Hygiene: Sit to stand from 3-in-1 or toilet Tub/Shower Transfer Method: Not assessed Transfers/Ambulation Related to ADLs: Pt able to take a few small steps from the bedside commode up the edge of the bed with min assist and no assistive device. ADL Comments: Pt with increased difficulty reaching down to either foot or crossing LEs to reach his foot for simulated dressing tasks.  Incontinence of bowel when standing as well.    OT Diagnosis: Generalized weakness;Acute pain  OT Problem List: Decreased strength;Decreased activity tolerance;Impaired balance (sitting and/or standing);Decreased knowledge of use of DME or AE;Pain OT Treatment Interventions: Self-care/ADL training;DME and/or AE instruction;Therapeutic activities;Patient/family education;Balance training   OT Goals Acute Rehab OT Goals OT Goal Formulation: With patient Time For Goal Achievement: 05/03/12 Potential to Achieve Goals: Good ADL Goals Pt Will Perform Lower Body Bathing: with supervision;Sit to stand from bed;with adaptive equipment ADL Goal: Lower Body Bathing - Progress: Goal set today Pt Will Perform Lower Body Dressing: with supervision;Sit to stand from bed;with adaptive equipment ADL Goal: Lower Body Dressing - Progress: Goal set today Pt Will Transfer to Toilet: with supervision;Ambulation;3-in-1 ADL Goal: Toilet Transfer - Progress: Goal set today Pt Will Perform Toileting - Clothing Manipulation: with supervision;Sitting on 3-in-1 or toilet;Standing ADL Goal: Toileting - Clothing Manipulation - Progress: Goal set today Pt Will Perform Toileting - Hygiene: with supervision;Sit to stand from 3-in-1/toilet ADL Goal: Toileting - Hygiene - Progress: Goal set today  Visit Information  Last OT Received On: 04/19/12 Assistance Needed: +1    Subjective Data  Subjective: "I need my pants on, where's my khakis?" Patient Stated Goal: Did not state but agreeable to getting  up with therapy.   Prior Functioning  Vision/Perception  Home Living Lives  With: Alone;Other (Comment) (Family lives nearby and comes into help as needed.) Available Help at Discharge: Family;Available 24 hours/day Type of Home: House Home Access: Stairs to enter [employer] of Steps: 5 Entrance Stairs-Rails: Right;Left Home Layout: One level Bathroom Shower/Tub: Engineer, manufacturing systems: Standard Bathroom Accessibility: Yes Home Adaptive Equipment: Bedside commode/3-in-1;Shower chair with back;Walker - rolling;Straight cane Prior Function Level of Independence: Independent with assistive device(s) Able to Take Stairs?: Yes Driving: No Vocation: Retired Musician: No difficulties Dominant Hand: Right   Vision - Assessment Vision Assessment: Vision not tested Perception Perception: Within Functional Limits Praxis Praxis: Intact  Cognition  Overall Cognitive Status: Impaired Area of Impairment: Memory Arousal/Alertness: Awake/alert (Pt sleeping as therapist came into the room.) Orientation Level: Appears intact for tasks assessed Behavior During Session: Ryan Hess for tasks performed Memory Deficits: Pt with occassional visual hallucination of seeing something on the bed that wasn't there  ex "See if it's under that carpet ."    Extremity/Trunk Assessment Right Upper Extremity Assessment RUE ROM/Strength/Tone: Within functional levels (strength 4/5 throughout) RUE Sensation: WFL - Light Touch RUE Coordination: WFL - gross/fine motor Left Upper Extremity Assessment LUE ROM/Strength/Tone: Within functional levels (strength 4/5 throughout) LUE Sensation: WFL - Light Touch LUE Coordination: WFL - gross/fine motor Trunk Assessment Trunk Assessment: Normal   Mobility Bed Mobility Bed Mobility: Supine to Sit Supine to Sit: 4: Min assist;HOB elevated Transfers Transfers: Sit to Stand Sit to Stand: 4: Min assist;From bed;Without upper  extremity assist      Balance Balance Balance Assessed: Yes Static Sitting Balance Static Sitting - Balance Support: Right upper extremity supported;Left upper extremity supported Static Sitting - Level of Assistance: 5: Stand by assistance Dynamic Standing Balance Dynamic Standing - Balance Support: Right upper extremity supported;Left upper extremity supported Dynamic Standing - Level of Assistance: 4: Min assist  End of Session OT - End of Session Activity Tolerance: Patient tolerated treatment well Patient left: in bed;with call bell/phone within reach;with nursing in room Nurse Communication: Mobility status     Katlyne Nishida OTR/L Pager number [phone] 04/19/2012, 3:14 PM

## 2012-04-19 NOTE — Progress Notes (Signed)
Extubated successfully yesterday afternoon.  Remains extubated without any respiratory issues.  PCCM will sign off.  Please reconsult if necessary.  Orlean Bradford, M.D., F.C.C.P. Pulmonary and Critical Care Medicine [facility] Cell: [phone] Pager: [phone]

## 2012-04-19 NOTE — Progress Notes (Signed)
Patient found climbing over side rails-"I am going home".  Able to re-orient patient but patient started to climb out of bed again with nurse present.  Patient moved to 2306 to be closer to nurse and charge nurse desk.  Sitter at bedside.  Mittens on to prevent patient from dislodging NG tube. 2054:  Sons at bedside.  Explanation for moving patient given, emotional support to family members and patient.

## 2012-04-19 NOTE — Evaluation (Signed)
Physical Therapy Evaluation Patient Details Name: Ryan Hess MRN: [id number] DOB: 09/24/1924 Today's Date: 04/19/2012 Time: 4098-1191 PT Time Calculation (min): 27 min  PT Assessment / Plan / Recommendation Clinical Impression  Pt s/p SBO with exp laparotomy and wound VAC. Pt progressing well with mobility and sons present end of session clarifying history that pt does life with spouse and family can be available 24hrs/day to help as they stated SNF is not an option. Pt will benefit from acute therapy to maximize mobility, gait and transfers prior to discharge to increase independence and decrease burden of care.     PT Assessment  Patient needs continued PT services    Follow Up Recommendations  Home health PT;Supervision for mobility/OOB    Barriers to Discharge None      Equipment Recommendations  None recommended by PT    Recommendations for Other Services     Frequency Min 3X/week    Precautions / Restrictions Precautions Precautions: Fall Precaution Comments: wound VAC, NGT Restrictions Weight Bearing Restrictions: No   Pertinent Vitals/Pain No pain      Mobility  Bed Mobility Bed Mobility: Rolling Left;Left Sidelying to Sit;Sitting - Scoot to [place] of Bed Rolling Left: 4: Min assist Left Sidelying to Sit: 4: Min assist Supine to Sit: 4: Min assist;HOB elevated Sitting - Scoot to [place] of Bed: 5: Supervision Details for Bed Mobility Assistance: cueing for sequence and assist to rotate body and transfer side to sit Transfers Transfers: Sit to Stand;Stand to Sit Sit to Stand: 4: Min assist;From bed Stand to Sit: 4: Min guard;To chair/3-in-1;With armrests Details for Transfer Assistance: cueing for hand placement, sequence and safety Ambulation/Gait Ambulation/Gait Assistance: 4: Min assist Ambulation Distance (Feet): 300 Feet Assistive device: Rolling walker Ambulation/Gait Assistance Details: cueing for posture and to step into RW Gait Pattern:  Step-through pattern;Decreased stride length;Trunk flexed Gait velocity: decreased Stairs: No    Exercises     PT Diagnosis: Difficulty walking;Altered mental status  PT Problem List: Decreased activity tolerance;Decreased mobility;Decreased cognition;Decreased knowledge of use of DME PT Treatment Interventions: Gait training;Stair training;DME instruction;Functional mobility training;Therapeutic activities;Therapeutic exercise;Patient/family education   PT Goals Acute Rehab PT Goals PT Goal Formulation: With patient/family Time For Goal Achievement: 05/03/12 Potential to Achieve Goals: Good Pt will go Supine/Side to Sit: with supervision PT Goal: Supine/Side to Sit - Progress: Goal set today Pt will go Sit to Supine/Side: with supervision PT Goal: Sit to Supine/Side - Progress: Goal set today Pt will go Sit to Stand: with supervision PT Goal: Sit to Stand - Progress: Goal set today Pt will go Stand to Sit: with supervision PT Goal: Stand to Sit - Progress: Goal set today Pt will Ambulate: >150 feet;with least restrictive assistive device;with supervision PT Goal: Ambulate - Progress: Goal set today Pt will Go Up / Down Stairs: 3-5 stairs;with min assist;with least restrictive assistive device PT Goal: Up/Down Stairs - Progress: Goal set today  Visit Information  Last PT Received On: 04/19/12 Assistance Needed: +1    Subjective Data  Subjective: well, that was longer than I thought Patient Stated Goal: return home   Prior Functioning  Home Living Lives With: Spouse Available Help at Discharge: Available 24 hours/day (spouse, son while he isn't at work 6a-2p) Type of Home: House Home Access: Stairs to enter Secretary/administrator of Steps: 5 Entrance Stairs-Rails: Right;Left Home Layout: One level Bathroom Shower/Tub: Engineer, manufacturing systems: Standard Bathroom Accessibility: Yes Home Adaptive Equipment: Bedside commode/3-in-1;Walker - rolling;Shower chair with  back;Wheelchair - manual;Grab  bars in shower Additional Comments: pt states he and son care for wife with meals Prior Function Level of Independence: Independent with assistive device(s) Able to Take Stairs?: Yes Driving: No Vocation: Retired Musician: No difficulties Dominant Hand: Right    Cognition  Overall Cognitive Status: Impaired Area of Impairment: Memory Arousal/Alertness: Awake/alert Orientation Level: Disoriented to;Time Behavior During Session: WFL for tasks performed Memory Deficits: Pt with occassional visual hallucination of seeing something on the bed that wasn't there  ex "See if it's under that carpet ." Cognition - Other Comments: No hallucinations during PT but confused with PLOF and who he lives with.     Extremity/Trunk Assessment Right Upper Extremity Assessment RUE ROM/Strength/Tone: Within functional levels (strength 4/5 throughout) RUE Sensation: WFL - Light Touch RUE Coordination: WFL - gross/fine motor Left Upper Extremity Assessment LUE ROM/Strength/Tone: Within functional levels (strength 4/5 throughout) LUE Sensation: WFL - Light Touch LUE Coordination: WFL - gross/fine motor Right Lower Extremity Assessment RLE ROM/Strength/Tone: WFL for tasks assessed Left Lower Extremity Assessment LLE ROM/Strength/Tone: WFL for tasks assessed Trunk Assessment Trunk Assessment: Normal   Balance Balance Balance Assessed: Yes Static Sitting Balance Static Sitting - Balance Support: Right upper extremity supported;Left upper extremity supported Static Sitting - Level of Assistance: 5: Stand by assistance Dynamic Standing Balance Dynamic Standing - Balance Support: Right upper extremity supported;Left upper extremity supported Dynamic Standing - Level of Assistance: 4: Min assist  End of Session PT - End of Session Equipment Utilized During Treatment: Gait belt Activity Tolerance: Patient tolerated treatment well Patient left: in chair;with  call bell/phone within reach;with family/visitor present Nurse Communication: Mobility status  GP     Delorse Lek 04/19/2012, 4:43 PM  Delaney Meigs, PT [phone]

## 2012-04-19 NOTE — Progress Notes (Signed)
1 Day Post-Op  Subjective: Extubated.  Awake and alert.  Cough  Objective: Vital signs in last 24 hours: Temp:  [97.6 F (36.4 C)-99.8 F (37.7 C)] 98.2 F (36.8 C) (07/26 0758) Pulse Rate:  [38-96] 85  (07/26 0800) Resp:  [15-34] 18  (07/26 0800) BP: (91-127)/(47-82) 110/51 mmHg (07/26 0800) SpO2:  [90 %-100 %] 94 % (07/26 0800) Arterial Line BP: (128-142)/(74-82) 142/82 mmHg (07/25 1500) FiO2 (%):  [40 %] 40 % (07/25 1402) Weight:  [181 lb 10.5 oz (82.4 kg)] 181 lb 10.5 oz (82.4 kg) (07/26 0400)    Intake/Output from previous day: 07/25 0701 - 07/26 0700 In: 4135 [I.V.:3985; NG/GT:150] Out: 1155 [Urine:855; Emesis/NG output:300] Intake/Output this shift: Total I/O In: 175 [I.V.:145; NG/GT:30] Out: 35 [Urine:35]  Resp: rhonchi bilaterally Cardio: regular rate and rhythm, S1, S2 normal, no murmur, click, rub or gallop Incision/Wound:vac in place.  Abdomen soft non distended.  Lab Results:   Basename 04/19/12 0447 04/17/12 0230  WBC 6.8 11.8*  HGB 8.8* 10.5*  HCT 27.7* 33.2*  PLT 163 139*   BMET  Basename 04/19/12 0447 04/17/12 0230  NA 143 142  K 4.2 4.7  CL 110 108  CO2 26 21  GLUCOSE 105* 136*  BUN 24* 20  CREATININE 1.08 1.17  CALCIUM 8.2* 8.4   PT/INR  Basename 04/16/12 2044  LABPROT 15.9*  INR 1.24   ABG  Basename 04/17/12 0308 04/17/12 0112  PHART 7.350 7.387  HCO3 23.5 23.7    Studies/Results: Portable Chest Xray In Am  04/18/2012  *RADIOLOGY REPORT*  Clinical Data: Evaluate lung fields / endotracheal tube positioned  PORTABLE CHEST - 1 VIEW  Comparison: Chest radiograph 04/17/2012  Findings: Endotracheal tube and NG tube unchanged.  Thoracic aortic stent is expanded.  Left central venous line unchanged.  Stable enlarged heart silhouette.  There is air space disease and in the lower lobes greater on the left.  IMPRESSION:  1.  Stable support apparatus. 2.  No significant change. 3.  Low lung volumes and basilar air space disease.  Original  Report Authenticated By: Genevive Bi, M.D.    Anti-infectives: Anti-infectives     Start     Dose/Rate Route Frequency Ordered Stop   04/17/12 1040   ceFAZolin (ANCEF) 3 g in dextrose 5 % 50 mL IVPB        3 g 160 mL/hr over 30 Minutes Intravenous 60 min pre-op 04/17/12 1040 04/18/12 1150   04/16/12 2345   ertapenem (INVANZ) 1 g in sodium chloride 0.9 % 50 mL IVPB        1 g 100 mL/hr over 30 Minutes Intravenous  Once 04/16/12 2340            Assessment/Plan: s/p Procedure(s) (LRB): EXPLORATORY LAPAROTOMY (N/A) APPLICATION OF WOUND VAC (N/A) Continue foley due to strict I&O and patient critically ill Ileus  Cont NGT. OOB Leave in ICU for critical illness Wound vac in place.  Change Sunday.  DVT prophylaxsis FEN  Follow for now.  Not much out NGT.  Hopefully remove sat and start clears.  LOS: 3 days    Ryan Hess A. 04/19/2012

## 2012-04-20 DIAGNOSIS — I509 Heart failure, unspecified: Secondary | ICD-10-CM

## 2012-04-20 LAB — GLUCOSE, CAPILLARY
Glucose-Capillary: 65 mg/dL — ABNORMAL LOW (ref 70–99)
Glucose-Capillary: 70 mg/dL (ref 70–99)
Glucose-Capillary: 77 mg/dL (ref 70–99)
Glucose-Capillary: 79 mg/dL (ref 70–99)
Glucose-Capillary: 81 mg/dL (ref 70–99)
Glucose-Capillary: 92 mg/dL (ref 70–99)

## 2012-04-20 MED ORDER — FUROSEMIDE 10 MG/ML IJ SOLN
20.0000 mg | Freq: Once | INTRAMUSCULAR | Status: AC
  Administered 2012-04-20: 20 mg via INTRAVENOUS
  Filled 2012-04-20: qty 2

## 2012-04-20 MED ORDER — DEXTROSE 50 % IV SOLN: 25.0000 mL | Freq: Once | INTRAVENOUS | Status: AC | PRN

## 2012-04-20 MED ORDER — DEXTROSE 50 % IV SOLN
INTRAVENOUS | Status: AC
  Administered 2012-04-20: 50 mL
  Filled 2012-04-20: qty 50

## 2012-04-20 NOTE — Progress Notes (Signed)
2 Days Post-Op  Subjective: HAVING BM  AWAKE A LITTLE CONFUSED.  Objective: Vital signs in last 24 hours: Temp:  [97.4 F (36.3 C)-98.6 F (37 C)] 98 F (36.7 C) (07/27 0400) Pulse Rate:  [45-125] 104  (07/27 0800) Resp:  [16-26] 18  (07/27 0800) BP: (93-126)/(49-92) 111/92 mmHg (07/27 0800) SpO2:  [89 %-100 %] 100 % (07/27 0800)    Intake/Output from previous day: 07/26 0701 - 07/27 0700 In: 3100 [I.V.:2910; NG/GT:180; IV Piggyback:10] Out: 1432 [Urine:835; Emesis/NG output:550; Drains:40; Stool:7] Intake/Output this shift: Total I/O In: 155 [I.V.:125; NG/GT:30] Out: 40 [Urine:40]  Resp: clear to auscultation bilaterally Cardio: occasional ectopic beats Extremities: edema 1 plus BLE Incision/Wound:VAC IN PLACE SOFT NON DISTENDED.  Lab Results:   [facility] 04/19/12 0447  WBC 6.8  HGB 8.8*  HCT 27.7*  PLT 163   BMET  Basename 04/19/12 0447  NA 143  K 4.2  CL 110  CO2 26  GLUCOSE 105*  BUN 24*  CREATININE 1.08  CALCIUM 8.2*   PT/INR No results found for this basename: LABPROT:2,INR:2 in the last 72 hours ABG No results found for this basename: PHART:2,PCO2:2,PO2:2,HCO3:2 in the last 72 hours  Studies/Results: No results found.  Anti-infectives: Anti-infectives     Start     Dose/Rate Route Frequency Ordered Stop   04/17/12 1040   ceFAZolin (ANCEF) 3 g in dextrose 5 % 50 mL IVPB        3 g 160 mL/hr over 30 Minutes Intravenous 60 min pre-op 04/17/12 1040 04/18/12 1150   04/16/12 2345   ertapenem (INVANZ) 1 g in sodium chloride 0.9 % 50 mL IVPB        1 g 100 mL/hr over 30 Minutes Intravenous  Once 04/16/12 2340            Assessment/Plan: s/p Procedure(s) (LRB): EXPLORATORY LAPAROTOMY (N/A) APPLICATION OF WOUND VAC (N/A) Advance diet Continue foley due to strict I&O CHF   Decrease IVF  AND DIURESIS WITH LASIX. OOB D/C NGT D/c INVANZ KEEP IN UNIT  LOS: 4 days    Ryan Hess A. 04/20/2012

## 2012-04-20 NOTE — Progress Notes (Signed)
CBG: 65   Treatment: D50 IV 25 mL  Symptoms: None  Follow-up CBG: Time:0400 CBG Result:92  Possible Reasons for Event: Other: Patient is NPO  Comments/MD notified:patient asymptomatic, continue to monitor.    Pola Corn

## 2012-04-21 LAB — GLUCOSE, CAPILLARY
Glucose-Capillary: 117 mg/dL — ABNORMAL HIGH (ref 70–99)
Glucose-Capillary: 55 mg/dL — ABNORMAL LOW (ref 70–99)
Glucose-Capillary: 77 mg/dL (ref 70–99)
Glucose-Capillary: 78 mg/dL (ref 70–99)
Glucose-Capillary: 81 mg/dL (ref 70–99)
Glucose-Capillary: 84 mg/dL (ref 70–99)
Glucose-Capillary: 92 mg/dL (ref 70–99)

## 2012-04-21 LAB — CBC
HCT: 26.8 % — ABNORMAL LOW (ref 39.0–52.0)
Hemoglobin: 8.7 g/dL — ABNORMAL LOW (ref 13.0–17.0)
MCH: 31 pg (ref 26.0–34.0)
MCHC: 32.5 g/dL (ref 30.0–36.0)
MCV: 95.4 fL (ref 78.0–100.0)
Platelets: 177 10*3/uL (ref 150–400)
RBC: 2.81 MIL/uL — ABNORMAL LOW (ref 4.22–5.81)
RDW: 16.4 % — ABNORMAL HIGH (ref 11.5–15.5)
WBC: 6.7 10*3/uL (ref 4.0–10.5)

## 2012-04-21 LAB — BASIC METABOLIC PANEL
BUN: 20 mg/dL (ref 6–23)
CO2: 26 mEq/L (ref 19–32)
Calcium: 7.6 mg/dL — ABNORMAL LOW (ref 8.4–10.5)
Chloride: 111 mEq/L (ref 96–112)
Creatinine, Ser: 0.92 mg/dL (ref 0.50–1.35)
GFR calc Af Amer: 86 mL/min — ABNORMAL LOW (ref >90)
GFR calc non Af Amer: 74 mL/min — ABNORMAL LOW (ref >90)
Glucose, Bld: 90 mg/dL (ref 70–99)
Potassium: 3.1 mEq/L — ABNORMAL LOW (ref 3.5–5.1)
Sodium: 144 mEq/L (ref 135–145)

## 2012-04-21 MED ORDER — POTASSIUM CHLORIDE CRYS ER 20 MEQ PO TBCR
EXTENDED_RELEASE_TABLET | ORAL | Status: AC
  Administered 2012-04-21: 40 meq
  Filled 2012-04-21: qty 2

## 2012-04-21 MED ORDER — POTASSIUM CHLORIDE CRYS ER 20 MEQ PO TBCR: 40.0000 meq | EXTENDED_RELEASE_TABLET | Freq: Once | ORAL | Status: AC

## 2012-04-21 MED ORDER — GERHARDT'S BUTT CREAM
TOPICAL_CREAM | Freq: Four times a day (QID) | CUTANEOUS | Status: DC | PRN
  Filled 2012-04-21: qty 1

## 2012-04-21 NOTE — Progress Notes (Signed)
3 Days Post-Op  Subjective: Having multiple BMs.  Still with some confusion.  Able to ambulate some  Objective: Vital signs in last 24 hours: Temp:  [97.5 F (36.4 C)-98.5 F (36.9 C)] 98.5 F (36.9 C) (07/28 0734) Pulse Rate:  [34-107] 84  (07/28 0700) Resp:  [15-25] 19  (07/28 0700) BP: (85-134)/(35-92) 119/55 mmHg (07/28 0700) SpO2:  [86 %-100 %] 100 % (07/28 0700) Last BM Date: 04/20/12  Intake/Output from previous day: 07/27 0701 - 07/28 0700 In: 2335 [P.O.:1180; I.V.:1125; NG/GT:30] Out: 1603 [Urine:1440; Emesis/NG output:150; Stool:13] Intake/Output this shift:    Abdomen soft, wound clean, VAC changed Nondistended, minimally tender  Lab Results:   Basename 04/21/12 0001 04/19/12 0447  WBC 6.7 6.8  HGB 8.7* 8.8*  HCT 26.8* 27.7*  PLT 177 163   BMET  Basename 04/21/12 0400 04/19/12 0447  NA 144 143  K 3.1* 4.2  CL 111 110  CO2 26 26  GLUCOSE 90 105*  BUN 20 24*  CREATININE 0.92 1.08  CALCIUM 7.6* 8.2*   PT/INR No results found for this basename: LABPROT:2,INR:2 in the last 72 hours ABG No results found for this basename: PHART:2,PCO2:2,PO2:2,HCO3:2 in the last 72 hours  Studies/Results: No results found.  Anti-infectives: Anti-infectives     Start     Dose/Rate Route Frequency Ordered Stop   04/17/12 1040   ceFAZolin (ANCEF) 3 g in dextrose 5 % 50 mL IVPB        3 g 160 mL/hr over 30 Minutes Intravenous 60 min pre-op 04/17/12 1040 04/18/12 1150   04/16/12 2345   ertapenem (INVANZ) 1 g in sodium chloride 0.9 % 50 mL IVPB  Status:  Discontinued        1 g 100 mL/hr over 30 Minutes Intravenous  Once 04/16/12 2340 04/20/12 0837          Assessment/Plan: s/p Procedure(s) (LRB): EXPLORATORY LAPAROTOMY (N/A) APPLICATION OF WOUND VAC (N/A)  Transfer to 3300 PT/OT Continue wound VAC Keep foley to monitor I's and O's closely  LOS: 5 days    Ashyah Quizon A 04/21/2012

## 2012-04-22 LAB — BASIC METABOLIC PANEL
BUN: 15 mg/dL (ref 6–23)
CO2: 26 mEq/L (ref 19–32)
Calcium: 7.5 mg/dL — ABNORMAL LOW (ref 8.4–10.5)
Chloride: 107 mEq/L (ref 96–112)
Creatinine, Ser: 0.85 mg/dL (ref 0.50–1.35)
GFR calc Af Amer: 89 mL/min — ABNORMAL LOW (ref >90)
GFR calc non Af Amer: 77 mL/min — ABNORMAL LOW (ref >90)
Glucose, Bld: 95 mg/dL (ref 70–99)
Potassium: 3.3 mEq/L — ABNORMAL LOW (ref 3.5–5.1)
Sodium: 140 mEq/L (ref 135–145)

## 2012-04-22 LAB — CBC
HCT: 25.5 % — ABNORMAL LOW (ref 39.0–52.0)
Hemoglobin: 8.2 g/dL — ABNORMAL LOW (ref 13.0–17.0)
MCH: 30 pg (ref 26.0–34.0)
MCHC: 32.2 g/dL (ref 30.0–36.0)
MCV: 93.4 fL (ref 78.0–100.0)
Platelets: 192 10*3/uL (ref 150–400)
RBC: 2.73 MIL/uL — ABNORMAL LOW (ref 4.22–5.81)
RDW: 16 % — ABNORMAL HIGH (ref 11.5–15.5)
WBC: 6.3 10*3/uL (ref 4.0–10.5)

## 2012-04-22 LAB — GLUCOSE, CAPILLARY
Glucose-Capillary: 85 mg/dL (ref 70–99)
Glucose-Capillary: 89 mg/dL (ref 70–99)
Glucose-Capillary: 89 mg/dL (ref 70–99)

## 2012-04-22 MED ORDER — POTASSIUM CHLORIDE CRYS ER 20 MEQ PO TBCR
40.0000 meq | EXTENDED_RELEASE_TABLET | Freq: Once | ORAL | Status: AC
  Administered 2012-04-22: 40 meq via ORAL
  Filled 2012-04-22: qty 2

## 2012-04-22 MED ORDER — ENSURE COMPLETE PO LIQD
237.0000 mL | Freq: Two times a day (BID) | ORAL | Status: DC
  Administered 2012-04-22 – 2012-04-25 (×5): 237 mL via ORAL

## 2012-04-22 NOTE — Progress Notes (Signed)
Left vac form for [facility] on shadow chart if needed for home vac.

## 2012-04-22 NOTE — Progress Notes (Signed)
Nutrition Follow-up  Intervention:    Ensure Complete twice daily between meals (350 kcals, 13 gm protein per 8 fl oz bottle) RD to follow for nutrition care plan  Assessment:   Patient extubated 7/25. S/p exploratory laparotomy with creation of small bowel anastomosis and closure of abdomen 7/25. Wound VAC in place. NGT discontinued 7/27. States his appetite is good. PO intake 90% per patient report. + multiple BM's. Would benefit from addition of supplement -- RD to order.  Diet Order:  Full Liquids  Meds: Scheduled Meds:   . albuterol-ipratropium  2 puff Inhalation Q6H  . enoxaparin (LOVENOX) injection  40 mg Subcutaneous Q24H  . metoprolol  5 mg Intravenous Q6H  . pantoprazole (PROTONIX) IV  40 mg Intravenous Q24H  . potassium chloride  40 mEq Oral Once  . DISCONTD: insulin aspart  0-15 Units Subcutaneous Q4H   Continuous Infusions:   . sodium chloride 15 mL/hr (04/18/12 0651)  . lactated ringers 50 mL/hr at 04/22/12 0619   PRN Meds:.fentaNYL, Gerhardt's butt cream  Labs:  CMP     Component Value Date/Time   NA 140 04/22/2012 0400   K 3.3* 04/22/2012 0400   CL 107 04/22/2012 0400   CO2 26 04/22/2012 0400   GLUCOSE 95 04/22/2012 0400   BUN 15 04/22/2012 0400   CREATININE 0.85 04/22/2012 0400   CREATININE 1.30 08/29/2011 1147   CALCIUM 7.5* 04/22/2012 0400   PROT 5.5* 04/17/2012 0230   ALBUMIN 3.2* 04/17/2012 0230   AST 24 04/17/2012 0230   ALT 11 04/17/2012 0230   ALKPHOS 110 04/17/2012 0230   BILITOT 1.9* 04/17/2012 0230   GFRNONAA 77* 04/22/2012 0400   GFRAA 89* 04/22/2012 0400     Intake/Output Summary (Last 24 hours) at 04/22/12 1038 Last data filed at 04/22/12 1000  Gross per 24 hour  Intake   2320 ml  Output    940 ml  Net   1380 ml    CBG (last 3)   Basename 04/22/12 0727 04/22/12 0413 04/21/12 2354  GLUCAP 85 89 89    Weight Status:  82.4 kg (7/26) -- stable  Re-estimated needs:  2000-2200 kcals, 100-110 gm protein  New Nutrition Dx:  Inadequate  Protein-Energy Intake r/t full liquid diet as evidenced by meeting ~ 60% of re-estimated kcals, protein needs  New Goal:  Oral intake with meals & supplements to meet >/= 90% of estimated nutrition needs, unmet  Monitor:  PO & supplemental intake, weight, labs, I/O's  Kirkland Hun, RD, LDN Pager #: [phone] After-Hours Pager #: [phone]

## 2012-04-22 NOTE — Clinical Documentation Improvement (Signed)
CHF DOCUMENTATION CLARIFICATION QUERY  THIS DOCUMENT IS NOT A PERMANENT PART OF THE MEDICAL RECORD  Please update your documentation within the medical record to reflect your response to this query.                                                                                     04/22/12  [facility],  In a better effort to capture your patient's severity of illness, reflect appropriate length of stay and utilization of resources, a review of the patient medical record has revealed the following indicators the diagnosis of Heart Failure:  Intake/Output from previous day:  07/26 0701 - 07/27 0700  In: 3100 [I.V.:2910; NG/GT:180; IV Piggyback:10]  Out: [id number] [Urine:835; Emesis/NG output:550; Drains:40; Stool:7] Extremities: edema 1 plus BLE CHF Decrease IVF AND DIURESIS WITH LASIX." Eloina Ergle A.  04/20/2012     Progress Note  Intake/Output from previous day:  07/27 0701 - 07/28 0700  In: 2335 [P.O.:1180; I.V.:1125; NG/GT:30]  Out: 1603 [Urine:1440; Emesis/NG output:150; Stool:13] BLACKMAN,DOUGLAS A  04/21/2012     Progress Note  History of Chronic Systolic CHF per [facility] problem list for this admission entered 04/19/12      Based on your clinical judgment, please document the ACUITY and TYPE of CHF treated with IV Lasix and slowing of IV fluids on 03/21/12, in the progress notes and discharge summary:  ACUITY  - Acute  - Acute on Chronic  - Chronic  And   TYPE  - Systolic  - Diastolic  - Combined Systolic and Diastolic   In responding to this query please exercise your independent judgment.    The fact that a query is asked, does not imply that any particular answer is desired or expected.   Reviewed:  no additional documentation provided  Thank You,  Erling Conte  RN BSN Certified Clinical Documentation Specialist: Cell   [phone]  Health Information Management [place] [place]   TO RESPOND TO THE THIS QUERY, FOLLOW  THE INSTRUCTIONS BELOW:  1. If needed, update documentation for the patient's encounter via the notes activity.  2. Access this query again and click edit on the In [employer].  3. After updating, or not, click F2 to complete all highlighted (required) fields concerning your review. Select "additional documentation in the medical record" OR "no additional documentation provided".  4. Click Sign note button.  5. The deficiency will fall out of your In Basket *Please let us know if you are not able to complete this workflow by phone or e-mail (listed below).

## 2012-04-22 NOTE — Progress Notes (Signed)
Occupational Therapy Treatment Patient Details Name: Ryan Hess MRN: [id number] DOB: 04-21-1924 Today's Date: 04/22/2012 Time: 1333-1400 OT Time Calculation (min): 27 min  OT Assessment / Plan / Recommendation Comments on Treatment Session Pt very willing to work with therapies, but limited by fatigue.  Mobility and cognition have improved.    Follow Up Recommendations  Home health OT    Barriers to Discharge       Equipment Recommendations  None recommended by OT    Recommendations for Other Services    Frequency Min 2X/week   Plan Discharge plan remains appropriate    Precautions / Restrictions Precautions Precautions: Fall Precaution Comments: wound vac, multiple lines Restrictions Weight Bearing Restrictions: No   Pertinent Vitals/Pain Abdominal pain with coughing    ADL  Grooming: Performed;Set up Where Assessed - Grooming: Supported sitting Toilet Transfer: Mining engineer Method: Sit to stand Equipment Used: Gait belt;Rolling walker Transfers/Ambulation Related to ADLs: Pt ambulated with min guard assist, second person to assist with lines. ADL Comments: Pt limited by fatigue after ambulation and reaching to feet due to wound. Instructed to abdomen with pillow when coughing.    OT Diagnosis:    OT Problem List:   OT Treatment Interventions:     OT Goals Acute Rehab OT Goals OT Goal Formulation: With patient Time For Goal Achievement: 05/03/12 Potential to Achieve Goals: Good ADL Goals Pt Will Transfer to Toilet: with supervision;Ambulation;3-in-1 ADL Goal: Toilet Transfer - Progress: Progressing toward goals  Visit Information  Last OT Received On: 04/22/12 Assistance Needed: +1 PT/OT Co-Evaluation/Treatment: Yes    Subjective Data      Prior Functioning       Cognition  Overall Cognitive Status: Appears within functional limits for tasks assessed/performed Arousal/Alertness: Awake/alert Orientation Level:  Appears intact for tasks assessed Behavior During Session: [facility] for tasks performed    Mobility Bed Mobility Bed Mobility: Supine to Sit;Sitting - Scoot to Edge of Bed Supine to Sit: 4: Min assist;HOB elevated Sitting - Scoot to [place] of Bed: 5: Supervision Transfers Transfers: Sit to Stand;Stand to Sit Sit to Stand: From bed;4: Min guard Stand to Sit: 4: Min guard;To chair/3-in-1;With armrests Details for Transfer Assistance: verbal cues for technique for safety, attempts to walk away from walker as approaching chair.   Exercises    Balance    End of Session OT - End of Session Activity Tolerance: Patient limited by fatigue Patient left: in chair;with call bell/phone within reach Nurse Communication: Other (comment) (toleration of activity)  GO     Ryan Hess 04/22/2012, 2:14 PM [phone]

## 2012-04-22 NOTE — Progress Notes (Signed)
Patient ID: Ryan Hess, male   DOB: 1924-04-04, 78 y.o.   MRN: [id number] 4 Days Post-Op  Subjective: Occasional mild pain, no other C/O.  Tl CL well  Objective: Vital signs in last 24 hours: Temp:  [97.6 F (36.4 C)-98.1 F (36.7 C)] 97.7 F (36.5 C) (07/29 0739) Pulse Rate:  [45-107] 93  (07/29 0600) Resp:  [17-27] 18  (07/29 0600) BP: (89-127)/(47-86) 117/53 mmHg (07/29 0600) SpO2:  [94 %-100 %] 98 % (07/29 0734) Last BM Date: 04/21/12  Intake/Output from previous day: 07/28 0701 - 07/29 0700 In: 2490 [P.O.:1340; I.V.:1150] Out: 1085 [Urine:885; Stool:200] Intake/Output this shift:    General appearance: alert, cooperative and no distress Resp: clear to auscultation bilaterally GI: normal findings: soft, non-tender Neurologic: Mental status: Alert, oriented, thought content appropriate, orientation: date, person, place Incision/Wound: VAC clean and dry  Lab Results:   Basename 04/22/12 0400 04/21/12 0001  WBC 6.3 6.7  HGB 8.2* 8.7*  HCT 25.5* 26.8*  PLT 192 177   BMET  Basename 04/22/12 0400 04/21/12 0400  NA 140 144  K 3.3* 3.1*  CL 107 111  CO2 26 26  GLUCOSE 95 90  BUN 15 20  CREATININE 0.85 0.92  CALCIUM 7.5* 7.6*     Studies/Results: No results found.  Anti-infectives: Anti-infectives     Start     Dose/Rate Route Frequency Ordered Stop   04/17/12 1040   ceFAZolin (ANCEF) 3 g in dextrose 5 % 50 mL IVPB        3 g 160 mL/hr over 30 Minutes Intravenous 60 min pre-op 04/17/12 1040 04/18/12 1150   04/16/12 2345   ertapenem (INVANZ) 1 g in sodium chloride 0.9 % 50 mL IVPB  Status:  Discontinued        1 g 100 mL/hr over 30 Minutes Intravenous  Once 04/16/12 2340 04/20/12 0837          Assessment/Plan: s/p Procedure(s): EXPLORATORY LAPAROTOMY APPLICATION OF WOUND VAC Doing well FL diet Cont PT/OT   LOS: 6 days    Robbert Langlinais T 04/22/2012

## 2012-04-22 NOTE — Progress Notes (Signed)
Physical Therapy Treatment Patient Details Name: Ryan Hess MRN: [id number] DOB: 06/12/1924 Today's Date: 04/22/2012 Time: 1330-1401 PT Time Calculation (min): 31 min  PT Assessment / Plan / Recommendation Comments on Treatment Session  Patient very sweet and motivated to ambulate. Need to encourage efficiency with mobility and posture with ambulation    Follow Up Recommendations  Home health PT;Supervision for mobility/OOB    Barriers to Discharge        Equipment Recommendations  None recommended by OT    Recommendations for Other Services    Frequency Min 3X/week   Plan Discharge plan remains appropriate;Frequency remains appropriate    Precautions / Restrictions Precautions Precautions: Fall Precaution Comments: wound vac, multiple lines  Restrictions Weight Bearing Restrictions: No   Pertinent Vitals/Pain     Mobility  Bed Mobility Bed Mobility: Supine to Sit;Sitting - Scoot to Edge of Bed Supine to Sit: 4: Min assist;HOB elevated Sitting - Scoot to [place] of Bed: 5: Supervision Details for Bed Mobility Assistance: A to position shoulders into upright sitting position. Cues for positioning and sequency Transfers Sit to Stand: 4: Min guard;From bed;With upper extremity assist Stand to Sit: To chair/3-in-1;4: Min guard Details for Transfer Assistance: Cues for safe hand placement and not to pull up on RW.  Ambulation/Gait Ambulation/Gait Assistance: 4: Min guard Ambulation Distance (Feet): 300 Feet Assistive device: Rolling walker Ambulation/Gait Assistance Details: Cues for posture and to look forward vs looking down at this feet.  Gait Pattern: Step-through pattern;Decreased stride length;Trunk flexed    Exercises     PT Diagnosis:    PT Problem List:   PT Treatment Interventions:     PT Goals Acute Rehab PT Goals PT Goal: Supine/Side to Sit - Progress: Progressing toward goal PT Goal: Sit to Stand - Progress: Progressing toward goal PT Goal: Stand to  Sit - Progress: Progressing toward goal PT Goal: Ambulate - Progress: Progressing toward goal  Visit Information  Last PT Received On: 04/22/12 Assistance Needed: +1    Subjective Data      Cognition  Overall Cognitive Status: Appears within functional limits for tasks assessed/performed Arousal/Alertness: Awake/alert Orientation Level: Appears intact for tasks assessed Behavior During Session: [facility] for tasks performed    Balance     End of Session PT - End of Session Equipment Utilized During Treatment: Gait belt Activity Tolerance: Patient tolerated treatment well Patient left: in chair;with call bell/phone within reach Nurse Communication: Mobility status   GP     Fredrich Birks 04/22/2012, 3:02 PM 04/22/2012 Fredrich Birks PTA [phone] pager [phone] office

## 2012-04-23 MED ORDER — POTASSIUM CHLORIDE 20 MEQ/15ML (10%) PO LIQD
40.0000 meq | Freq: Once | ORAL | Status: DC
  Filled 2012-04-23: qty 30

## 2012-04-23 MED ORDER — POTASSIUM CHLORIDE CRYS ER 20 MEQ PO TBCR
EXTENDED_RELEASE_TABLET | ORAL | Status: AC
  Administered 2012-04-23: 40 meq
  Filled 2012-04-23: qty 2

## 2012-04-23 MED ORDER — KCL IN DEXTROSE-NACL 20-5-0.45 MEQ/L-%-% IV SOLN
INTRAVENOUS | Status: DC
  Administered 2012-04-23 – 2012-04-24 (×2): via INTRAVENOUS
  Filled 2012-04-23 (×3): qty 1000

## 2012-04-23 MED ORDER — FUROSEMIDE 10 MG/ML IJ SOLN
40.0000 mg | Freq: Once | INTRAMUSCULAR | Status: AC
  Administered 2012-04-23: 40 mg via INTRAVENOUS
  Filled 2012-04-23: qty 4

## 2012-04-23 MED ORDER — ALBUTEROL SULFATE HFA 108 (90 BASE) MCG/ACT IN AERS: 1.0000 | INHALATION_SPRAY | RESPIRATORY_TRACT | Status: DC | PRN

## 2012-04-23 NOTE — Progress Notes (Signed)
Patient interviewed and examined, agree with PA note above.  Mariella Saa MD, FACS  04/23/2012 2:05 PM

## 2012-04-23 NOTE — Progress Notes (Signed)
Patient ID: AMYR WASZAK, male   DOB: 1923-11-02, 78 y.o.   MRN: [id number] Patient ID: ABRON HAQ, male   DOB: November 07, 1923, 78 y.o.   MRN: [id number] 5 Days Post-Op  Subjective: Still with occasional loose stools, no c/o abdominal pain, wound vac remains in place.   Objective: Vital signs in last 24 hours: Temp:  [97.7 F (36.5 C)-98.4 F (36.9 C)] 98.4 F (36.9 C) (07/30 0728) Pulse Rate:  [44-130] 93  (07/30 0700) Resp:  [17-28] 24  (07/30 0700) BP: (87-127)/(41-94) 112/94 mmHg (07/30 0700) SpO2:  [88 %-100 %] 100 % (07/30 0700) Weight:  [188 lb 0.8 oz (85.3 kg)] 188 lb 0.8 oz (85.3 kg) (07/30 0500) Last BM Date: 04/22/12  Intake/Output from previous day: 07/29 0701 - 07/30 0700 In: 2410 [P.O.:1200; I.V.:1200; IV Piggyback:10] Out: 557 [Urine:507; Drains:50] Intake/Output this shift:    General appearance: alert, cooperative and no distress. Chest: clear to auscultation bilaterally Abdomen: soft, non-tender, wound vac in place and functioning well. A/A/O, VSS, Afebrile.   Lab Results:   Basename 04/22/12 0400 04/21/12 0001  WBC 6.3 6.7  HGB 8.2* 8.7*  HCT 25.5* 26.8*  PLT 192 177   BMET  Basename 04/22/12 0400 04/21/12 0400  NA 140 144  K 3.3* 3.1*  CL 107 111  CO2 26 26  GLUCOSE 95 90  BUN 15 20  CREATININE 0.85 0.92  CALCIUM 7.5* 7.6*     Studies/Results: No results found.  Anti-infectives: Anti-infectives     Start     Dose/Rate Route Frequency Ordered Stop   04/17/12 1040   ceFAZolin (ANCEF) 3 g in dextrose 5 % 50 mL IVPB        3 g 160 mL/hr over 30 Minutes Intravenous 60 min pre-op 04/17/12 1040 04/18/12 1150   04/16/12 2345   ertapenem (INVANZ) 1 g in sodium chloride 0.9 % 50 mL IVPB  Status:  Discontinued        1 g 100 mL/hr over 30 Minutes Intravenous  Once 04/16/12 2340 04/20/12 0837          Assessment/Plan: s/p Procedure(s): EXPLORATORY LAPAROTOMY APPLICATION OF WOUND VAC  1. Continue with PT/OT 2. Continue with Full  liquid diet 3. Transfer to 3300 4. Continue with wound vac    LOS: 7 days    Kelse Ploch 04/23/2012

## 2012-04-24 LAB — GLUCOSE, CAPILLARY: Glucose-Capillary: 95 mg/dL (ref 70–99)

## 2012-04-24 MED ORDER — OXYCODONE HCL 5 MG PO TABS: 5.0000 mg | ORAL_TABLET | ORAL | Status: DC | PRN

## 2012-04-24 MED ORDER — PANTOPRAZOLE SODIUM 40 MG PO TBEC
40.0000 mg | DELAYED_RELEASE_TABLET | Freq: Every day | ORAL | Status: DC
  Administered 2012-04-24: 40 mg via ORAL
  Filled 2012-04-24: qty 1

## 2012-04-24 MED ORDER — METOPROLOL TARTRATE 25 MG PO TABS
25.0000 mg | ORAL_TABLET | Freq: Two times a day (BID) | ORAL | Status: DC
  Administered 2012-04-24: 25 mg via ORAL
  Filled 2012-04-24 (×3): qty 1

## 2012-04-24 NOTE — Progress Notes (Signed)
Patient ID: Ryan Hess, male   DOB: May 19, 1924, 78 y.o.   MRN: [id number] 6 Days Post-Op  Subjective: Pt without complaints, tolerating regular diet now and having BMs, wants to go home.  Objective: Vital signs in last 24 hours: Temp:  [97.7 F (36.5 C)-98.1 F (36.7 C)] 97.8 F (36.6 C) (07/31 0555) Pulse Rate:  [58-124] 86  (07/31 0555) Resp:  [16-24] 16  (07/31 0555) BP: (98-120)/(57-80) 108/61 mmHg (07/31 0555) SpO2:  [93 %-100 %] 96 % (07/31 0555) Last BM Date: 04/23/12  Intake/Output from previous day: 07/30 0701 - 07/31 0700 In: 870 [P.O.:470; I.V.:400] Out: 2190 [Urine:2065; Drains:125] Intake/Output this shift:    Physical Exam: Abd: soft, mildly tender, +bs, wound vac in place.   Lab Results:   Basename 04/22/12 0400  WBC 6.3  HGB 8.2*  HCT 25.5*  PLT 192   BMET  Basename 04/22/12 0400  NA 140  K 3.3*  CL 107  CO2 26  GLUCOSE 95  BUN 15  CREATININE 0.85  CALCIUM 7.5*     Studies/Results: No results found.  Anti-infectives: Anti-infectives     Start     Dose/Rate Route Frequency Ordered Stop   04/17/12 1040   ceFAZolin (ANCEF) 3 g in dextrose 5 % 50 mL IVPB        3 g 160 mL/hr over 30 Minutes Intravenous 60 min pre-op 04/17/12 1040 04/18/12 1150   04/16/12 2345   ertapenem (INVANZ) 1 g in sodium chloride 0.9 % 50 mL IVPB  Status:  Discontinued        1 g 100 mL/hr over 30 Minutes Intravenous  Once 04/16/12 2340 04/20/12 0837          Assessment/Plan: 1.  POD#7-Exp lap w/small-bowel resection and abd wound VAC placement: now on regular diet and tolerating well, working with PT, wants to go home today.  Can go home today if home health is set up for PT and RN for wound vac changes.  Follow up in 2 weeks for wound check and post-op visit.   LOS: 8 days    Clea Dubach 04/24/2012

## 2012-04-24 NOTE — Progress Notes (Signed)
Patient unable to urinate , bladder scan 280. MD on call paged.

## 2012-04-24 NOTE — Discharge Instructions (Signed)
[facility]      [facility] [phone]  OPEN ABDOMINAL SURGERY: POST OP INSTRUCTIONS  Always review your discharge instruction sheet given to you by the facility where your surgery was performed.  IF YOU HAVE DISABILITY OR FAMILY LEAVE FORMS, YOU MUST BRING THEM TO THE OFFICE FOR PROCESSING.  PLEASE DO NOT GIVE THEM TO YOUR DOCTOR.  1. A prescription for pain medication may be given to you upon discharge.  Take your pain medication as prescribed, if needed.  If narcotic pain medicine is not needed, then you may take acetaminophen (Tylenol) or ibuprofen (Advil) as needed. 2. Take your usually prescribed medications unless otherwise directed. 3. If you need a refill on your pain medication, please contact your pharmacy. They will contact our office to request authorization.  Prescriptions will not be filled after 5pm or on week-ends. 4. You should follow a light diet the first few days after arrival home, such as soup and crackers, pudding, etc.unless your doctor has advised otherwise. A high-fiber, low fat diet can be resumed as tolerated.   Be sure to include lots of fluids daily. Most patients will experience some swelling and bruising on the chest and neck area.  Ice packs will help.  Swelling and bruising can take several days to resolve 5. Most patients will experience some swelling and bruising in the area of the incision. Ice pack will help. Swelling and bruising can take several days to resolve..  6. It is common to experience some constipation if taking pain medication after surgery.  Increasing fluid intake and taking a stool softener will usually help or prevent this problem from occurring.  A mild laxative (Milk of Magnesia or Miralax) should be taken according to package directions if there are no bowel movements after 48 hours. 7.  You may have steri-strips (small skin tapes) in place directly over the incision.  These strips should be left on the skin for 7-10 days.  If your  surgeon used skin glue on the incision, you may shower in 24 hours.  The glue will flake off over the next 2-3 weeks.  Any sutures or staples will be removed at the office during your follow-up visit. You may find that a light gauze bandage over your incision may keep your staples from being rubbed or pulled. You may shower and replace the bandage daily. 8. ACTIVITIES:  You may resume regular (light) daily activities beginning the next day--such as daily self-care, walking, climbing stairs--gradually increasing activities as tolerated.  You may have sexual intercourse when it is comfortable.  Refrain from any heavy lifting or straining until approved by your doctor. a. You may drive when you no longer are taking prescription pain medication, you can comfortably wear a seatbelt, and you can safely maneuver your car and apply brakes b. Return to Work: ___________________________________ 9. You should see your doctor in the office for a follow-up appointment approximately two weeks after your surgery.  Make sure that you call for this appointment within a day or two after you arrive home to insure a convenient appointment time. OTHER INSTRUCTIONS:  _____________________________________________________________ _____________________________________________________________  WHEN TO CALL YOUR DOCTOR: 1. Fever over 101.0 2. Inability to urinate 3. Nausea and/or vomiting 4. Extreme swelling or bruising 5. Continued bleeding from incision. 6. Increased pain, redness, or drainage from the incision. 7. Difficulty swallowing or breathing 8. Muscle cramping or spasms. 9. Numbness or tingling in hands or feet or around lips.  The clinic staff is available to   answer your questions during regular business hours.  Please don't hesitate to call and ask to speak to one of the nurses if you have concerns.  For further questions, please visit [email or web address]   

## 2012-04-24 NOTE — Progress Notes (Signed)
Occupational Therapy Treatment Patient Details Name: LOREN SAWAYA MRN: [id number] DOB: 11/28/1923 Today's Date: 04/24/2012 Time: 1550-1610 OT Time Calculation (min): 20 min  OT Assessment / Plan / Recommendation Comments on Treatment Session Pt very motivated to return home. Is unable to return home at this time due to incontinent episodes and need for frequent toileting. Unable to clean feet independently att his time. 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    Follow Up Recommendations  Home health OT    Barriers to Discharge       Equipment Recommendations  None recommended by OT    Recommendations for Other Services    Frequency Min 2X/week   Plan Discharge plan remains appropriate    Precautions / Restrictions Precautions Precautions: Fall Precaution Comments: Wound vac Restrictions Weight Bearing Restrictions: No   Pertinent Vitals/Pain none    ADL  Lower Body Bathing: Performed;Minimal assistance Where Assessed - Lower Body Bathing: Unsupported sit to stand Lower Body Dressing: Performed;Moderate assistance Where Assessed - Lower Body Dressing: Unsupported sit to stand Toilet Transfer: Performed;Supervision/safety Toilet Transfer Method: Sit to Barista: Comfort height toilet Toileting - Clothing Manipulation and Hygiene: Performed;Moderate assistance Where Assessed - Toileting Clothing Manipulation and Hygiene: Standing Transfers/Ambulation Related to ADLs: S ADL Comments: Pt with several incontinenet episodes of watery BM. Pt unaware at times of incontinence. nsg aware.    OT Diagnosis:    OT Problem List:   OT Treatment Interventions:     OT Goals Acute Rehab OT Goals OT Goal Formulation: With patient Time For Goal Achievement: 05/03/12 Potential to Achieve Goals: Good ADL Goals Pt Will Perform Lower Body Bathing: with supervision;Sit to stand from bed;with adaptive equipment ADL Goal: Lower Body Bathing - Progress:  Progressing toward goals Pt Will Perform Lower Body Dressing: with supervision;Sit to stand from bed;with adaptive equipment ADL Goal: Lower Body Dressing - Progress: Progressing toward goals Pt Will Transfer to Toilet: with supervision;Ambulation;3-in-1 ADL Goal: Toilet Transfer - Progress: Met Pt Will Perform Toileting - Clothing Manipulation: with supervision;Sitting on 3-in-1 or toilet;Standing ADL Goal: Toileting - Clothing Manipulation - Progress: Progressing toward goals Pt Will Perform Toileting - Hygiene: with supervision;Sit to stand from 3-in-1/toilet ADL Goal: Toileting - Hygiene - Progress: Progressing toward goals  Visit Information  Last OT Received On: 04/24/12 Assistance Needed: +1    Subjective Data      Prior Functioning       Cognition  Overall Cognitive Status: Appears within functional limits for tasks assessed/performed Arousal/Alertness: Awake/alert Orientation Level: Appears intact for tasks assessed Behavior During Session: [facility] for tasks performed    Mobility Bed Mobility Bed Mobility: Supine to Sit;Sitting - Scoot to Edge of Bed Rolling Left: 4: Min guard Details for Bed Mobility Assistance: Cues for positioning Transfers Transfers: Sit to Stand;Stand to Sit Sit to Stand: 5: Supervision;From bed Stand to Sit: 5: Supervision;To chair/3-in-1   Exercises    Balance    End of Session OT - End of Session Activity Tolerance: Other (comment) (limited by diarrhea) Patient left: with family/visitor present;Other (comment) (in bathroom) Nurse Communication: Other (comment) (runny BM)  GO     Asami Lambright,HILLARY 04/24/2012, 4:24 PM [facility], OTR/L  [phone] 04/24/2012

## 2012-04-24 NOTE — Progress Notes (Signed)
Patient interviewed and examined.  Foley just out and has not yet voided.  Will watch today and shoot for discharge tomorrow.  Mariella Saa MD, FACS  04/24/2012 12:44 PM

## 2012-04-24 NOTE — Progress Notes (Signed)
Physical Therapy Treatment Patient Details Name: Ryan Hess MRN: [id number] DOB: [birth year] Today's Date: 04/24/2012 Time: [id number] PT Time Calculation (min): 15 min  PT Assessment / Plan / Recommendation Comments on Treatment Session  Patient progressing well. Required perciare EOB. Anticpating DC home today    Follow Up Recommendations  Home health PT;Supervision for mobility/OOB    Barriers to Discharge        Equipment Recommendations  None recommended by PT    Recommendations for Other Services    Frequency Min 3X/week   Plan Discharge plan remains appropriate;Frequency remains appropriate    Precautions / Restrictions Precautions Precautions: Fall Precaution Comments: Wound vac   Pertinent Vitals/Pain     Mobility  Bed Mobility Supine to Sit: 4: Min guard;With rails Sitting - Scoot to Edge of Bed: 5: Supervision Details for Bed Mobility Assistance: Cues for positioning Transfers Sit to Stand: 4: Min guard;With upper extremity assist;From bed Stand to Sit: With upper extremity assist;4: Min guard;To chair/3-in-1 Details for Transfer Assistance: Cue for safe technique as patient attempting to pull up using RW.  Ambulation/Gait Ambulation/Gait Assistance: 4: Min guard Ambulation Distance (Feet): 300 Feet Assistive device: Rolling walker Ambulation/Gait Assistance Details: Cues for posture Gait Pattern: Step-through pattern;Trunk flexed    Exercises     PT Diagnosis:    PT Problem List:   PT Treatment Interventions:     PT Goals Acute Rehab PT Goals PT Goal: Supine/Side to Sit - Progress: Met PT Goal: Sit to Stand - Progress: Progressing toward goal PT Goal: Stand to Sit - Progress: Progressing toward goal PT Goal: Ambulate - Progress: Progressing toward goal  Visit Information  Last PT Received On: 04/24/12 Assistance Needed: +1    Subjective Data      Cognition  Overall Cognitive Status: Appears within functional limits for tasks  assessed/performed Arousal/Alertness: Awake/alert Orientation Level: Appears intact for tasks assessed Behavior During Session: [facility] for tasks performed    Balance     End of Session PT - End of Session Equipment Utilized During Treatment: Gait belt Activity Tolerance: Patient tolerated treatment well Patient left: in chair;with call bell/phone within reach Nurse Communication: Mobility status   GP     Jacqualyn Posey 04/24/2012, 12:02 PM  04/24/2012 Jacqualyn Posey PTA [phone] pager [phone] office

## 2012-04-24 NOTE — Discharge Summary (Addendum)
Physician Discharge Summary  Patient ID: Ryan Hess MRN: [id number] DOB/AGE: July 20, 1924 78 y.o.  Admit date: 04/16/2012 Discharge date: 04/25/2012  Admitting Diagnosis: Abdominal pain with ischemic bowel  Discharge Diagnosis Patient Active Problem List   Diagnosis Date Noted  . Chronic systolic congestive heart failure 04/19/2012  . Respiratory failure, post-operative 04/17/2012  . COPD (chronic obstructive pulmonary disease) 04/17/2012  . CHF (congestive heart failure) 04/17/2012  . Small bowel obstruction 04/17/2012  . Constipation 03/22/2012  . Hematuria 03/22/2012  . Anemia due to blood loss 03/22/2012  . Encephalopathy 02/06/2012  . Healthcare-associated pneumonia 02/03/2012  . Urinary retention 02/03/2012  . Hyponatremia 02/03/2012  . Epidural hematoma 01/11/2012  . OTHER DYSPHAGIA 07/15/2010  . PULMONARY NODULE 03/15/2010  . PNEUMONIA, ORGANISM UNSPECIFIED 02/03/2010  . CONGESTIVE HEART FAILURE 11/16/2009  . Atrial fibrillation 11/08/2009  . WEAKNESS 10/25/2009  . DYSPNEA 10/25/2009  . THORACIC AORTIC ANEURYSM 02/10/2009  . PREMATURE VENTRICULAR CONTRACTIONS, FREQUENT 03/17/2008  . PROSTATE CANCER 02/12/2008  . COLONIC POLYPS 02/12/2008  . PERIPHERAL VASCULAR DISEASE 02/12/2008  . COPD 02/12/2008  . DIVERTICULOSIS OF COLON 02/12/2008  . ACTINIC SKIN DAMAGE 02/12/2008  . HYPERCHOLESTEROLEMIA 08/19/2007  . HYPERTENSION 08/19/2007  . DEGENERATIVE JOINT DISEASE 08/19/2007    Consultants Critical Care Team  Procedures 1.  04/17/12 Exploratory laparotomy with small-bowel resection and abdominal wound VAC placement: Dr. Lilyan Punt 2:  04/18/12 Exploratory laparotomy with creation of small bowel anastomosis and closure of abdomen Dr. Charlie Pitter Course: 78 yr old male who presented to [facility] with increasing abdominal pain.  His physical exam showed severe abdominal pain concerning for bowel ischemia even though his lactate and WBC were within nornmal range.  CT  scan demonstrated bowel thickening and fluid around the intestines as well as possible pneumatosis.  He was then taken to the OR where the first procedure was performed.  The next day he return to the OR where the second procedure was performed.  The patient tolerated both procedures relatively well.  The patient was initially taken to ICU intubated and the critical care team was consulted to manage this.  He was weaned from the vent quickly.  He was left NPO until he began to have some bowel function.  Then his diet was advanced.  He initially had some diarrhea but this resolved.  On POD#8 he was tolerating a regular diet, walking with PT, vitals stable, wound vac changes going well, and felt ready to discharge home, however he was not able to void therefore he had to be discharged home with a foley catheter in place and will follow up with his urologist next week to have this removed.  He will go home with home health PT and RN.      Medication List  As of 04/24/2012  9:27 AM   STOP taking these medications         warfarin 1 MG tablet         TAKE these medications         ADVAIR DISKUS 250-50 MCG/DOSE Aepb   Generic drug: Fluticasone-Salmeterol   Inhale 1 puff into the lungs Twice daily.      aspirin 81 MG tablet   Take 81 mg by mouth daily.      atenolol 25 MG tablet   Commonly known as: TENORMIN   take 1 tablet by mouth once daily      atorvastatin 10 MG tablet   Commonly known as: LIPITOR   Take 1 tablet (10 mg  total) by mouth daily.      ferrous sulfate 325 (65 FE) MG tablet   Take 325 mg by mouth 2 (two) times daily.      furosemide 40 MG tablet   Commonly known as: LASIX   Take 40 mg by mouth 2 (two) times daily.      guaiFENesin 600 MG 12 hr tablet   Commonly known as: MUCINEX   Take 600 mg by mouth 2 (two) times daily. COPD.      losartan 100 MG tablet   Commonly known as: COZAAR   Take 50 mg by mouth daily.      multivitamin tablet   Take 1 tablet by mouth  daily.      oxyCODONE 5 MG immediate release tablet   Commonly known as: Oxy IR/ROXICODONE   Take 1-2 tablets (5-10 mg total) by mouth every 3 (three) hours as needed.      potassium chloride 10 MEQ tablet   Commonly known as: K-DUR   Take 1 tablet (10 mEq total) by mouth 2 (two) times daily.      Tamsulosin HCl 0.4 MG Caps   Commonly known as: FLOMAX   Take 0.4 mg by mouth at bedtime.      tiotropium 18 MCG inhalation capsule   Commonly known as: SPIRIVA   Place 1 capsule (18 mcg total) into inhaler and inhale daily.             Follow-up Information    Follow up with L[place], [place], DO. Schedule an appointment as soon as possible for a visit in 2 weeks. (Please call our office to schedule an appointment to see Dr. Lilyan Punt for follow up)    Contact information:   [address] [place] [phone]          Signed: Lowell Bouton [facility] [id number] 04/25/2012 8:57 AM

## 2012-04-25 ENCOUNTER — Telehealth: Payer: Self-pay | Admitting: Pulmonary Disease

## 2012-04-25 MED ORDER — OXYCODONE HCL 5 MG PO TABS: 5.0000 mg | ORAL_TABLET | ORAL | Status: AC | PRN

## 2012-04-25 NOTE — Progress Notes (Signed)
dIscharge patient . Wet to dry dressing applied to abdomen, advance to apply wound vac at home per case management.

## 2012-04-25 NOTE — Telephone Encounter (Signed)
I talked to son Terry> Ryan Hess was disch today ON his prev Atenolol, Lasix, Losartan KCl... Only prob was that his Aten & Losar were on HOLd due to low BP when last seen...  Son indicates BP= 95 sys at home now & we discussed leaving the Aten & Losar ON HOLD for now & monitor BP... He has appt w/ me next wk 8/9 at 11:30AM & will cal sooner as needed.Marland KitchenMarland Kitchen

## 2012-04-25 NOTE — Telephone Encounter (Signed)
Spoke with pt's son, [name]. He states that the pt has been recently d/c'ed from the hospital with instructions to start taking atenolol 25 mg qd again. He had been taken off of this before b/c BP was too low and [name] states that it is now running lower than when he was in the hospital now- 95/70 was the only reading he could recall. His pulse has been high, around 110 today resting. He wants to be sure that it is ok with SN to restart med. Please advise, thanks!

## 2012-05-03 ENCOUNTER — Ambulatory Visit (INDEPENDENT_AMBULATORY_CARE_PROVIDER_SITE_OTHER): Payer: Medicare Other | Admitting: Pulmonary Disease

## 2012-05-03 ENCOUNTER — Encounter (INDEPENDENT_AMBULATORY_CARE_PROVIDER_SITE_OTHER): Payer: Medicare Other

## 2012-05-03 ENCOUNTER — Other Ambulatory Visit (INDEPENDENT_AMBULATORY_CARE_PROVIDER_SITE_OTHER): Payer: Medicare Other

## 2012-05-03 ENCOUNTER — Encounter: Payer: Self-pay | Admitting: Pulmonary Disease

## 2012-05-03 VITALS — BP 116/78 | HR 82 | Temp 97.0°F | Ht 69.0 in | Wt 186.2 lb

## 2012-05-03 DIAGNOSIS — I509 Heart failure, unspecified: Secondary | ICD-10-CM

## 2012-05-03 DIAGNOSIS — S064XAA Epidural hemorrhage with loss of consciousness status unknown, initial encounter: Secondary | ICD-10-CM

## 2012-05-03 DIAGNOSIS — R5383 Other fatigue: Secondary | ICD-10-CM

## 2012-05-03 DIAGNOSIS — C61 Malignant neoplasm of prostate: Secondary | ICD-10-CM

## 2012-05-03 DIAGNOSIS — J4489 Other specified chronic obstructive pulmonary disease: Secondary | ICD-10-CM

## 2012-05-03 DIAGNOSIS — I10 Essential (primary) hypertension: Secondary | ICD-10-CM

## 2012-05-03 DIAGNOSIS — I4891 Unspecified atrial fibrillation: Secondary | ICD-10-CM

## 2012-05-03 DIAGNOSIS — J449 Chronic obstructive pulmonary disease, unspecified: Secondary | ICD-10-CM

## 2012-05-03 DIAGNOSIS — R0989 Other specified symptoms and signs involving the circulatory and respiratory systems: Secondary | ICD-10-CM

## 2012-05-03 DIAGNOSIS — R339 Retention of urine, unspecified: Secondary | ICD-10-CM

## 2012-05-03 DIAGNOSIS — M79609 Pain in unspecified limb: Secondary | ICD-10-CM

## 2012-05-03 DIAGNOSIS — S064X9A Epidural hemorrhage with loss of consciousness of unspecified duration, initial encounter: Secondary | ICD-10-CM

## 2012-05-03 DIAGNOSIS — M7989 Other specified soft tissue disorders: Secondary | ICD-10-CM

## 2012-05-03 DIAGNOSIS — K56609 Unspecified intestinal obstruction, unspecified as to partial versus complete obstruction: Secondary | ICD-10-CM

## 2012-05-03 DIAGNOSIS — E7800 Pure hypercholesterolemia, unspecified: Secondary | ICD-10-CM

## 2012-05-03 DIAGNOSIS — R5381 Other malaise: Secondary | ICD-10-CM

## 2012-05-03 DIAGNOSIS — I739 Peripheral vascular disease, unspecified: Secondary | ICD-10-CM

## 2012-05-03 LAB — BASIC METABOLIC PANEL
BUN: 14 mg/dL (ref 6–23)
CO2: 26 mEq/L (ref 19–32)
Calcium: 8.4 mg/dL (ref 8.4–10.5)
Chloride: 108 mEq/L (ref 96–112)
Creatinine, Ser: 1.1 mg/dL (ref 0.4–1.5)
GFR: 66.67 mL/min (ref >60.00)
Glucose, Bld: 81 mg/dL (ref 70–99)
Potassium: 4 mEq/L (ref 3.5–5.1)
Sodium: 140 mEq/L (ref 135–145)

## 2012-05-03 LAB — CBC WITH DIFFERENTIAL/PLATELET
Basophils Absolute: 0.1 10*3/uL (ref 0.0–0.1)
Basophils Relative: 1 % (ref 0.0–3.0)
Eosinophils Absolute: 0.2 10*3/uL (ref 0.0–0.7)
Eosinophils Relative: 2.4 % (ref 0.0–5.0)
HCT: 29.7 % — ABNORMAL LOW (ref 39.0–52.0)
Hemoglobin: 9.5 g/dL — ABNORMAL LOW (ref 13.0–17.0)
Lymphocytes Relative: 13.7 % (ref 12.0–46.0)
Lymphs Abs: 0.9 10*3/uL (ref 0.7–4.0)
MCHC: 31.9 g/dL (ref 30.0–36.0)
MCV: 93.8 fl (ref 78.0–100.0)
Monocytes Absolute: 0.5 10*3/uL (ref 0.1–1.0)
Monocytes Relative: 7.1 % (ref 3.0–12.0)
Neutro Abs: 4.9 10*3/uL (ref 1.4–7.7)
Neutrophils Relative %: 75.8 % (ref 43.0–77.0)
Platelets: 259 10*3/uL (ref 150.0–400.0)
RBC: 3.17 Mil/uL — ABNORMAL LOW (ref 4.22–5.81)
RDW: 17.5 % — ABNORMAL HIGH (ref 11.5–14.6)
WBC: 6.4 10*3/uL (ref 4.5–10.5)

## 2012-05-03 LAB — BRAIN NATRIURETIC PEPTIDE: Pro B Natriuretic peptide (BNP): 409 pg/mL — ABNORMAL HIGH (ref 0.0–100.0)

## 2012-05-03 NOTE — Progress Notes (Signed)
Subjective:     Patient ID: Ryan Hess, male   DOB: 1923-10-28, 78 y.o.   MRN: [id number]  HPI 78 y/o WM here for a follow up visit... he has multiple medical problems as noted below...    ~  August 28, 2011:  13 month ROV & he is remarkably w/o complaints at 78 y/o w/ multisystem disease... BP well controlled, denies CP/ palpit/ ch in SOB, edema; LABS show FLP looks good on Lip10, BMet wnl on Lasix20+K20, CBC ok & TSH borderline (we are following)...    He saw Cherly Hensen 9/12 for f/u HBP, CAD, chr AFib on Coumadin, ASPVD> doing satis, cough resolved off Lisinopril (ch to [place]), stable on meds & Coumadin- no changes made...    He saw DrGearhardt 11/12 for f/u thoraco-abdominal aneurysm, no surg due to age & comorbidities> f/u CT scan 12/12 showed severe emphysema & scarring w/ bibasilar atx (no infiltrates no cavities), markedly tortuous & ectatic Thor Ao w/ extensive atherosclerotic changes & no change in decr thor aneurysm measuring 5.9 x 5.6cm, mild cardiomeg & dense coronary calcif as well...    He saw DrDBrodie 8/12 for f/u esoph dysmotility & hx colon polyps > known presbyesoph, no stricture, denies swallowing prob; he had neg colonoscopy 2006 (w/o recurrent polyps, +divertics/ hems); no f/u colon needed due to age & comorbidities...    He saw DrWrenn 8/12 for f/u prostate cancer dx in 2009 & treated w/ XRT completed 6/09; slowly rising PSA w/ doubling time 6-50mo min voiding symptoms, he is considering androgen ablation if needed...  ~  March 22, 2012:  79moOV & [name] been through a lot >> In Feb2013 he [facility]- starting 5cm distal to the left subclavian down to the prox aspect of there prev [name] graft;  They chose to proceed w/ a fenestrated endovasc repair> done 01/01/12;  Disch on Coumadin for his AFib & Lovenox bridge but he developed a complic w/ lumbar epidural hematomas w/ paraplegia  requiring readmission for laminectomy & evac of the hematomas;  [facility] by [facility] w/ altered mental status, pneumonia, AFib w/ rvr, & FTT> Neuro w/u was otherw neg (& he improved w/ supportive care); CXR showed L>R bibasilar airsp dis (covered w/ IV antibiotics & disch on Avelox); AFib was rate controlled w/ med adjustments & Coumadin continued; Urinary retension required foley cath & Urology outpt management; his protimes got too thin & he developed hematuria & acute blood loss anemia; now BP has been low necessitating a HOLD on his BP med...  Thoracoabd aneurysm treated w/ fenestrated stent graft repair 4/13 [facility]ber [facility] >  Lumbar epidural hematomas w/ paraparesis requiring [facility]aminectomy 4/13 [facility] > ambulating w/ walker & gaining strength...  Bilat lower lobe pneumonias treated in-pt 5/13 and disch on Avelox > He is afeb & starting to feel better, actually looks pretty good (just pale); CXR today showed resolution of infiltrates...  AFib w/ RVR> meds adjusted and Coumadin carefully monitored; Heart rate= 62 off Atenolol; Protime=13.6/1.2 Off Coumadin x several days now; decision will need to be made regarding restart coumadin when safe...  Urinary retension from the lumbar surg and hematuria developing while on coumadin > he  has foley cath & leg bag w/ clear yellow urine at present; appt to see DrWrenn in 3d to consider further options (?voiding trial, in&out cath, etc)...  Hypotension w/ weakness & required down titration in BP meds & careful monitoring of BP etc > currently on Lasix40, K10Bid; BP= 118/66 and has varied 90's/70's to 120's/80's at home OFF his prev Aten25 & Losartan50;  Chem's are WNL & BNP=552...  Anemia > He developed acute blood loss anemia w/ post op Hg= 11-12 but dropped to 8.6  w/ hematuria & INR=4.2; Coumadin & ASA placed on HOLD; f/u labs showed Hg=8.4, Fe=31 (11%sat);  Rec to start FeSO4 Bid now...  Complaints of constipation > we reviewed the need for MIRALAX 17gm in water daily, and SENAKOT-S 2tabs at bedtime... We reviewed prob list, meds, xrays and labs> CXR 6/13 showed stable cardiac silhouette, sl incr markings chronically, NAD, no edema, Ao stent in desc Ao appears stable... LABS 6/13:  Chems- ok w/ BUN=22 Creat=1.4 BNP=552;  CBC- Hg=8.4 Fe=31(11%sat) B12=953;  Protime=13.6/ INR1.2;  TSH=6.66  ~  May 03, 2012:  6wk ROV & post hosp follow up> [name] presented to the ER 7/23 - 04/25/12 w/ abd pain- ELap surg revealed ~5 feet of frankly ischemic small bowel, likely due to internal hernia and closed loop obstruction=> Bowel resected & pulses intact w/ no evidence of embolic or thrombotic pathology;  Viable small bowel anastomosed & wound vac applied; he had a stable post op course=> foley left in place;  Disch 8/1 w/ home health etc...    Since disch we had call from son regarding pts Cardiac meds and BP> when last seen his BP was low & he was weak- Aten & Losartan placed on hold, continued on Lasix40, he's been off his Coumadin since 6/13 w/ hematuria/ anemia=> hasn't yet been restarted & only covered transiently in hosp w/ Lovenox, disch on ASA81, no coumadin;  BP since disch was still low (95/70) so Aten, Losartan kept on Bell continued...     He is pale & weak but gaining strength he says;  He saw Urology 8/8 w/ adeq voiding trial & Foley removed, voiding satis so far;  Since disch he's noted some pitting edema in LEs and L>R arm swelling (he had left IJ line in hosp)=> we decided to check VenDopplers ASAP & prelim report indicates no subclavian clots;  Needs Cards follow up & decision regarding restart of Coumadin w/ careful protime monitoring...  We reviewed prob list, meds, xrays and labs> see below for updates >> LABS 7/13 in hosp near disch>  Hg=8.2, MCV=93, BS=ok, K=3.3, Creat=0.85, Alb=3.2, LFTs=wnl... CXR 7/13 showed cardiomeg, intubated, low lung vols & bilat LL airsp dis L>R... 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          Problem List:  COPD (ICD-496) Hx of PNEUMONIA, ORGANISM UNSPECIFIED (ICD-486) PULMONARY NODULE & CAVITY RLL (ICD-518.89) - he is an ex-smoker, having quit in [place] after 40 yrs of smoking... he was exercising regularly and walking daily ~51m 5-6 days per week... on [place], +MUCINEX 2Bid w/ Fluids... ~  baseline CXR & CTChest w/ marked emphysema, atheromatous calcif, 5cm desc thorAA...  ~  CXR & CTA 2/11 in hosp showed cardiomeg + coronary calcif, advanced atherosclerosis of Ao w/ aneuryms 5.8cm into abd, underlying emphysema w/ interst edema &  bilat effusions, no PE... ~  4/11:  RLL pneumonia which was slow to clear... ~  6/11:  f/u CXR & CT Chest 6/11 w/ 6cm cavitary area RLL & nodular component inferiorly, +hilar adenopathy, no mediastinal nodes, etc; Tumor markers showed CEA=7.5 & Ca19-9= 10.8; > IR felt lesion was too hi risk for bx, therefore contin Rx & observe. ~  8/11:  f/u CXR w/o change in RLL opac (no worsening)>> repeat CEA=6.5; plan f/u CT Chest in 38mo ~  10/11: f/u CT Chest showed interval decrease in size of RLL cavitary lesion & the inferiorly placed soft tissue component; otherw there is severe emphysema, cardiomeg, coronary calcif, thoracoabd aneurysm w/o change; there was an air-fluid level in a dilated esoph> check Ba Esophagram= Nonspecific esophageal motility disorder with very poor primary esophageal contractions (no HH, stricture, etc)... ~  12/12: f/u CTChest showed severe emphysema & scarring w/ bibasilar atx (no infiltrates no cavities), markedly tortuous & ectaticThorAo w/ extensive atherosclerotic changes & no change in decr thor aneurysm measuring  5.9 x 5.6cm, mild cardiomeg & dense coronary calcif as well... ~  4/13:  He had a fenestrated end[facility]asc [facility] ~  subseq CXRs w/ cardiomeg, low lung vols, bibasilar airsp dis, etc...   HYPERTENSION (ICD-401.9)                         << followed by DCherly Hensenfor Cards >> CONGESTIVE HEART FAILURE (ICD-428.0) ATRIAL FIBRILLATION (ICD-427.31) w/ clot in left atrial appendage >> resolved on f/u TEE, on COUMADIN per Cards & cardioverted. PREMATURE VENTRICULAR CONTRACTIONS, FREQUENT (ICD-427.69)  MEDS> prev on ATENOLOL '50mg'$ /d, LOSARTAN '100mg'$ /d, & LASIX '20mg'$ -2AM + KCl 155m/d... prev Norvasc discontinued & hx ACE cough in the past on Lisinopril. ~  NuclearStressTest 1/05 was neg- no ischemia or infarct (+diaphrag attenuation), EF=56%... ~  repeat Nuclear study 6/09 was neg- no ischemia, mild inferoapic thinning, not gated due to PVCs... ~  2DEcho 6/09 showed mild dilated LV w/ EF= 45-50% but no regional wall motion abn, mild AoV calcif & AI, LA mild dil... ~  12/10:  [place] ~  2/11:  in hosp- Cath= mild nonobstructive 3 vessel CAD, mod LVD w/ EF=40-45%; TEE= CHF w/ EF~45% w/ HK & left atrial appendage clot & AFib; he was diuresed & Norvasc stopped, placed on Coumadin w/ careful f/u by DrCherly Hensen the [facility]... ~  [facility]... ~  Recurrent AFib w/ RVR after his Thoracoabdominal stent graft 4/13... ~  6/13:  BP was low assoc w/ GU bleeding & ATENOLOL25 & LOSARTAN50 placed on HOLD... ~  COUMADIN on HOLD since GU bleeding & acute blood loss anemia; it has not yet been restarted... ~  8/13:  BP= 116/78 on LASIX '40mg'$ /d & K10-Bid...  PERIPHERAL VASCULAR DISEASE (ICD-443.9) - prev on ASA '81mg'$ /d (now also on HOLD)... he is s/p infrarenal AAA repair w/ right common iliac aneurysm repair (via Ao Bi-iliac graft) in 1998 by DrLawson... also has a known desc thor AA measuring ~5+cm and followed by  DrGearhardt periodically... ~  seen by DrGearhart 4/10 w/ CT scan showing ~5cm thoracoabdominal aneurysm w/o signif change...  ~  seen by DrGearhart 2/11 in hosp w/ sl incr size of AAA- f/u planned 13m39mo  also followed by [facility]- DrCooper. ~  seen by DrGearhardt 10/11 & stable, no change, f/u 1 yr. ~  Seen 12/12 by  DrGearhardt & CTscan is stable, continue conservative approach... ~  2/13:  He has eval in [facility] Endovasc clinic at [facility] by DrFarber... ~  4/13:  He had a fenestrated endovasc repair of Thoraco-abdominal AA by [facility]Farber at [facility]...  HYPERCHOLESTEROLEMIA (ICD-272.0) - on LIPITOR '10mg'$ /d...  ~  [place] 5/07 showed TChol 115, Tg 58, HDL 36, LDL 67 ~  FLP 5/08 showed TChol 118, TG 66, HDL 31, LDL 74 ~  FLP 5/09 showed TChol 126, TG 71, HDL 37, LDL 75 ~  FLP 6/10 showed TChol 116, TG 73, HDL 40, LDL 62 ~  2/11:  FLP not checked during the hospitalization... ~  FLP 4/11 in hosp showed TChol 94, TG 52, HDL 23, LDL 61 ~  FLP 12/12 on Lip10 showed TChol 107, TG 38, HDL 40, LDL 60  DIVERTICULOSIS OF COLON (ICD-562.10) & COLONIC POLYPS (ICD-211.3) - hx polyps in 2003 = tubular adenoma... ~  last colonoscopy 9/06 showed divertics, hems, no polyps.  PROSTATE CANCER (ICD-185) - eval by DrWrenn and they decided on XRT by DrKinard- finished 5/09 and he states that DrKinard "released me"... he sees DrWrenn every 6 months & they follow PSA closely (notes reviewed)... ~  8/12: f/u prostate cancer dx in 2009 & treated w/ XRT completed 6/09; slowly rising PSA w/ doubling time 6-85mo min voiding symptoms, he is considering androgen ablation if needed... ~  He developed hematuria & urinary retention requiring Urology eval, discontinuation of coumadin, & Foley placement=> subseq removed 8/13 w/ adeq voiding trial...  DEGENERATIVE JOINT DISEASE (ICD-715.90) - s/p bilat TKR's, [facility]- DrOlin... He has OXY-IR '5mg'$  as needed for pain...  ACTINIC SKIN DAMAGE (ICD-692.70) - followed by DBrunetta Jeans he knows to avoid  sun exposure, use sun screen, etc...  ANEMIA >> ~  Labs 6/13 showed Hg= 8.4, Fe= 31 (11%sat); Rec to start FeSO4 Bid... ~  Labs 7/13 in hosp showed Hg= 12.6=>8.2 at disch... ~  Labs 8/13 showed Hg= 9.5, MCV= 94; rec to continue FeSO4 Bid...   Past Surgical History  Procedure Date  . Total knee arthroplasty     bilateral  . Abdominal aortic aneurysm repair 1998  . Appendectomy   . Inguinal hernia repair     left  . Joint replacement   . Laparotomy 04/16/2012    Procedure: EXPLORATORY LAPAROTOMY;  Surgeon: BMadilyn Hook DO;  [facility] [facility]  Service: General;  Laterality: N/A;  exploratory Laparotomy,Small bowel ressection abdominal wound vac placement.  . Laparotomy 04/18/2012    Procedure: EXPLORATORY LAPAROTOMY;  Surgeon: TJoyice Faster Cornett, MD;  [facility] [facility]  Service: General;  Laterality: N/A;  exploratory laparotomy with small bowel anastomosis  . Application of wound vac 04/18/2012    Procedure: APPLICATION OF WOUND VAC;  Surgeon: TJoyice Faster Cornett, MD;  [place] [facility]  Service: General;  Laterality: N/A;  Removal of abdominal wound vac, Application of incisional  wound vac    Outpatient Encounter Prescriptions as of 05/03/2012  Medication Sig Dispense Refill  . ADVAIR DISKUS 250-50 MCG/DOSE AEPB Inhale 1 puff into the lungs Twice daily.      .Marland Kitchenatorvastatin (LIPITOR) 10 MG tablet Take 1 tablet (10 mg total) by mouth daily.  90 tablet  0  . ferrous sulfate 325 (65 FE) MG tablet Take 325 mg by mouth 2 (two) times daily.      . furosemide (LASIX) 40 MG tablet Take 40 mg by mouth daily.       .Marland KitchenguaiFENesin (MUCINEX) 600 MG 12 hr tablet  Take 600 mg by mouth 2 (two) times daily. COPD.      . Multiple Vitamin (MULTIVITAMIN) tablet Take 1 tablet by mouth daily.        . potassium chloride (K-DUR) 10 MEQ tablet Take 1 tablet (10 mEq total) by mouth 2 (two) times daily.  180 tablet  0  . Tamsulosin HCl (FLOMAX) 0.4 MG CAPS Take 0.4 mg by mouth at bedtime.       [name] tiotropium (SPIRIVA  HANDIHALER) 18 MCG inhalation capsule Place 1 capsule (18 mcg total) into inhaler and inhale daily.  90 capsule  3  . aspirin 81 MG tablet Take 81 mg by mouth daily.        [name] atenolol (TENORMIN) 25 MG tablet    << HOLD >>  30 tablet  11  . losartan (COZAAR) 100 MG tablet    << HOLD >>      . oxyCODONE (OXY IR/ROXICODONE) 5 MG immediate release tablet Take 1-2 tablets (5-10 mg total) by mouth every 4 (four) hours as needed.  60 tablet  0    No Known Allergies   Current Medications, Allergies, Past Medical History, Past Surgical History, Family History, and Social History were reviewed in [employer] record.   Review of Systems        See HPI - all other systems neg except as noted...  The patient complains of dyspnea on exertion.  The patient denies anorexia, fever, weight loss, weight gain, vision loss, decreased hearing, hoarseness, chest pain, syncope, peripheral edema, prolonged cough, headaches, hemoptysis, abdominal pain, melena, hematochezia, severe indigestion/heartburn, hematuria, incontinence, muscle weakness, suspicious skin lesions, transient blindness, difficulty walking, depression, unusual weight change, abnormal bleeding, enlarged lymph nodes, and angioedema.     Objective:   Physical Exam    WD, WN, 78 y/o WM in NAD... GENERAL:  Alert & oriented; pleasant & cooperative; pale complexion... HEENT:  [place]/AT, EOM-wnl, PERRLA, EACs-clear, TMs-wnl, NOSE-clear, THROAT-clear & wnl. NECK:  Supple w/ fairROM; no JVD; normal carotid impulses w/o bruits; no thyromegaly or nodules palpated; no lymphadenopathy. CHEST:  decr BS bilat, scat bibasilar rales, w/o wheezing/ rhonchi/ signs of consolidation. HEART:  irregular rhythm, gr1/6 diast murmur in Ao area, without rubs or gallops detected... ABDOMEN:  Soft & nontender; normal bowel sounds; no organomegaly or masses palpated... EXT: s/p bilat TKR's, mod arthritic changes; no varicose veins/ +venous insuffic/ no  edema. NEURO:  CN's intact;  gait abn; no focal neuro deficits... DERM:  No lesions noted; no rash etc...  RADIOLOGY DATA:  Reviewed in the EPIC EMR & discussed w/ the patient...  LABORATORY DATA:  Reviewed in the EPIC EMR & discussed w/ the patient...   Assessment:     COPD, Hx Pneumonia>  Prev CXR/CT abn has resolved back to baseline severe COPD/Emphysema; then developed bilat lower lobe airsp dis/ atx & encouraged to cough & deep breathe...  HBP>  Prev controlled on Aten50-1/2, Lasix20, Losartan100-1/2, K10; He called w/ weakness & hypotension- doses were 1st cut back, then meds held, & he is improved now on Lasix alone 7 we will continue to monitor...  CAD/ CHF/ etc>  Followed by Cherly Hensen & his prev notes are reviewed;  BNP is stable...  AFib>  On above + Coumadin followed in the CC but on hold now w/ hematuria & anemia; this will need to be restarted & rec f/u Cards...  Periph Vasc Dis>  S/p AAA repair 1998 by Sheryn Bison; then followed by DrGearhardt for thoraco-abd aneurysm &  referred to [facility] stent graft surg- done 4/13 by DrFarber w/ complications as noted...  Ven Insuffic & EDEMA>  He has some LE pitting & swelling L>R hand/ arm; VenDopplers 8/13 left arm w/o subclavian DVT- rec elevation, no salt, continue Lasix40/d...  CHOL>  FLP looks good on Lip10...  GI> Presbyesoph, Divertics, Hx polpyps>  Stable & followed by DrDBrodie...  Prostate Cancer>  Followed by DrWrenn  w/ slowly rising PSA as noted; then developed urinary retention after lumbar surg and hematuria after foley & coumadin; now urine has cleared off coumadin & he has f/u appt w/ DrWrenn in 3d to decide on further course...  DJD>  Followed by DrOlin, s/p bilat TKRs...  ANEMIA>  He developed acute blood loss anemia w/ post op Hg= 11-12 but dropped to 8.6 w/ hematuria & INR=4.2; Coumadin & ASA placed on HOLD; f/u labs showed Hg=8.4 & Fe=31 & started on FeSO4 Bid;  Now Hg=9.5 & rec to continue Fe Bid...  Actinic skin  changes>  Aware, he is monitored by Derm...     Plan:     Patient's Medications  New Prescriptions   No medications on file  Previous Medications   ADVAIR DISKUS 250-50 MCG/DOSE AEPB    Inhale 1 puff into the lungs Twice daily.   ASPIRIN 81 MG TABLET    Take 81 mg by mouth daily.     ATENOLOL (TENORMIN) 25 MG TABLET    << HOLD >>   ATORVASTATIN (LIPITOR) 10 MG TABLET    Take 1 tablet (10 mg total) by mouth daily.   FERROUS SULFATE 325 (65 FE) MG TABLET    Take 325 mg by mouth 2 (two) times daily.   FUROSEMIDE (LASIX) 40 MG TABLET    Take 40 mg by mouth daily.    GUAIFENESIN (MUCINEX) 600 MG 12 HR TABLET    Take 600 mg by mouth 2 (two) times daily. COPD.   LOSARTAN (COZAAR) 100 MG TABLET    << HOLD >>   MULTIPLE VITAMIN (MULTIVITAMIN) TABLET    Take 1 tablet by mouth daily.     OXYCODONE (OXY IR/ROXICODONE) 5 MG IMMEDIATE RELEASE TABLET    Take 1-2 tablets (5-10 mg total) by mouth every 4 (four) hours as needed.   POTASSIUM CHLORIDE (K-DUR) 10 MEQ TABLET    Take 1 tablet (10 mEq total) by mouth 2 (two) times daily.   TAMSULOSIN HCL (FLOMAX) 0.4 MG CAPS    Take 0.4 mg by mouth at bedtime.    TIOTROPIUM (SPIRIVA HANDIHALER) 18 MCG INHALATION CAPSULE    Place 1 capsule (18 mcg total) into inhaler and inhale daily.  Modified Medications                                                  << COUMADIN STILL ON HOLD >>  Discontinued Medications   No medications on file

## 2012-05-03 NOTE — Patient Instructions (Addendum)
Today we updated your med list in our EPIC system...    Continue your current medications the same for now...  Today we did your follow up blood work... We are also checking the blood flow test in the veins of your left arm...    We will call you w/ the results...  For now remember:    NO SALT or Sodium...    Elevate arms & legs as much as poss...    Continue the Lasix 40mg /d & monitor the BP as you are doing...  We will arrange for a Cardiology f/u w/ DrNishan...  Let's plan a follow up appt in 3 weeks.Marland KitchenMarland Kitchen

## 2012-05-06 NOTE — Progress Notes (Signed)
Quick Note:  LMTCB ______ 

## 2012-05-08 ENCOUNTER — Telehealth (INDEPENDENT_AMBULATORY_CARE_PROVIDER_SITE_OTHER): Payer: Self-pay | Admitting: General Surgery

## 2012-05-08 NOTE — Telephone Encounter (Signed)
[name] WITH [facility] CALLED TO REPORT THAT MR Goris  SUSTAINED A SKIN TEAR TO RT FOREARM/APPROXIMATELY 8X11/2 . She IS CALLING FOR PERMISSION TO DRESS SKIN TEAR WHEN VISITING FOR WOUND VAC CARE. SHE APPLIED STERI-STRIPS AND XERO-FORM DRESSING. I REVIEWED HIS WITH DR. Luisa Hart AND HE SAID THAT WOULD BE OK. [name] NOTIFIED/ GY

## 2012-05-15 ENCOUNTER — Ambulatory Visit (INDEPENDENT_AMBULATORY_CARE_PROVIDER_SITE_OTHER): Payer: Medicare Other | Admitting: General Surgery

## 2012-05-15 ENCOUNTER — Encounter (INDEPENDENT_AMBULATORY_CARE_PROVIDER_SITE_OTHER): Payer: Self-pay | Admitting: General Surgery

## 2012-05-15 VITALS — BP 118/70 | HR 75 | Temp 98.2°F | Ht 69.0 in | Wt 173.6 lb

## 2012-05-15 DIAGNOSIS — Z4889 Encounter for other specified surgical aftercare: Secondary | ICD-10-CM

## 2012-05-15 DIAGNOSIS — Z5189 Encounter for other specified aftercare: Secondary | ICD-10-CM

## 2012-05-15 NOTE — Progress Notes (Signed)
Subjective:     Patient ID: Ryan Hess, male   DOB: Jun 19, 1924, 78 y.o.   MRN: [id number]  HPI  This patient follows up about 3 weeks status post exploratory laparotomy and small bowel resection for strangulated intestine due to closed loop obstruction causing ischemic bowel. He was taken back to the operating room 48 hours later and anastomosis was performed and his abdomen was closed and he has had a wound VAC on his incision since then. He has been doing well and says that he has been eating just as well as prior to his procedure. He is also taking a multivitamin and he moves his bowels one to 2 times per day. He is not having any discomfort and his only complaint is weakness. He has had some low blood pressure and his doctor has been holding his hypertensive medications. Review of Systems     Objective:   Physical Exam No acute distress and nontoxic-appearing sitting probably in a chair he is using a walker to aid with ambulation. His abdomen incision has a wound VAC in the midline and I removed the wound VAC sponge today and he has a very narrow and shallow incision which is only a few millimeters wide and only 1-2 mm deep. He does have some proud granulation tissue at the base of his incision but otherwise this has healed very nicely and there is no sign of infection. I did apply some silver nitrate to the proud granulation tissue and applied a clean gauze dressing.    Assessment:     Status post exploratory laparotomy and small bowel resection-doing well He seems to have recovered much better than I anticipated given the extent of his surgery. He seems to not be having any difficulty with his appetite or maintaining his weight. No obvious sign of any malnutrition. I recommended that he continue a multivitamin and I would check some nutrition labs including iron profile, thiamine and other B. Vitamin levels, and an albumin. He says that he has a followup appointment with his primary care  physician who routinely draws labs and so we will see if these labs can be drawn with his routine labs. If not, then I will add in order to He comes back in about 2 weeks. I think if he continues with daily dressing changes of likely just plain gauze on his midline incision, this should heal up fairly shortly. I will see him back in about 2 weeks for repeat evaluation of his wound. Otherwise he can gradually increase his activity as tolerated and work on strengthening and physical therapy.    Plan:     I will see him back in 2 weeks for wound recheck and if he has not had his nutrition labs checked by then, I will order them at that time. Continue twice daily or PRN dry dressing changes to midline wound

## 2012-05-16 ENCOUNTER — Telehealth (INDEPENDENT_AMBULATORY_CARE_PROVIDER_SITE_OTHER): Payer: Self-pay | Admitting: General Surgery

## 2012-05-16 NOTE — Telephone Encounter (Signed)
Victorino Dike, nurse with [facility], asking to use hydrogel on pt's wound to keep it moist so it will heal better.  Read her Dr. Delice Lesch note for dry dressings, but she disagrees, saying it needs to stay moist so the new tissue will not dry out and fall off.  Wants Dr. Biagio Quint to reconsider wound care.  Please call Victorino Dike at [phone].

## 2012-05-17 NOTE — Telephone Encounter (Signed)
[facility] faxed a request for Hydrogel Order and Dr. Biagio Quint has signed order.  Faxed back to [facility] [phone]/05/17/cm

## 2012-05-20 ENCOUNTER — Telehealth: Payer: Self-pay | Admitting: Pulmonary Disease

## 2012-05-20 NOTE — Telephone Encounter (Signed)
Will forward message to Leigh per protocol and sign off on phone note.

## 2012-05-23 ENCOUNTER — Ambulatory Visit (INDEPENDENT_AMBULATORY_CARE_PROVIDER_SITE_OTHER): Payer: Medicare Other | Admitting: Pulmonary Disease

## 2012-05-23 ENCOUNTER — Encounter: Payer: Self-pay | Admitting: Pulmonary Disease

## 2012-05-23 VITALS — BP 112/70 | HR 91 | Temp 96.9°F | Ht 69.0 in | Wt 173.4 lb

## 2012-05-23 DIAGNOSIS — E7800 Pure hypercholesterolemia, unspecified: Secondary | ICD-10-CM

## 2012-05-23 DIAGNOSIS — J449 Chronic obstructive pulmonary disease, unspecified: Secondary | ICD-10-CM

## 2012-05-23 DIAGNOSIS — I4891 Unspecified atrial fibrillation: Secondary | ICD-10-CM

## 2012-05-23 DIAGNOSIS — I739 Peripheral vascular disease, unspecified: Secondary | ICD-10-CM

## 2012-05-23 DIAGNOSIS — M1990 Unspecified osteoarthritis, unspecified site: Secondary | ICD-10-CM

## 2012-05-23 DIAGNOSIS — J4489 Other specified chronic obstructive pulmonary disease: Secondary | ICD-10-CM

## 2012-05-23 DIAGNOSIS — D500 Iron deficiency anemia secondary to blood loss (chronic): Secondary | ICD-10-CM

## 2012-05-23 DIAGNOSIS — K5730 Diverticulosis of large intestine without perforation or abscess without bleeding: Secondary | ICD-10-CM

## 2012-05-23 DIAGNOSIS — I712 Thoracic aortic aneurysm, without rupture, unspecified: Secondary | ICD-10-CM

## 2012-05-23 DIAGNOSIS — I10 Essential (primary) hypertension: Secondary | ICD-10-CM

## 2012-05-23 DIAGNOSIS — D126 Benign neoplasm of colon, unspecified: Secondary | ICD-10-CM

## 2012-05-23 DIAGNOSIS — C61 Malignant neoplasm of prostate: Secondary | ICD-10-CM

## 2012-05-23 DIAGNOSIS — I509 Heart failure, unspecified: Secondary | ICD-10-CM

## 2012-05-23 NOTE — Patient Instructions (Addendum)
Today we updated your med list in our EPIC system...    Continue your current medications the same...  OK to restart Aspirin 81mg  w/ a meal...  Please take the LASIX 20mg  - 2 tabs in AM...  Remember> no salt, keep legs up, etc...  Let's plan a follow up visit in a bout 6 weeks.Marland KitchenMarland Kitchen

## 2012-05-23 NOTE — Progress Notes (Signed)
Subjective:     Patient ID: Ryan Hess, male   DOB: 1924/09/21, 78 y.o.   MRN: [id number]  HPI 78 y/o WM here for a follow up visit... he has multiple medical problems as noted below...    ~  August 28, 2011:  13 month ROV & he is remarkably w/o complaints at 78 y/o w/ multisystem disease... BP well controlled, denies CP/ palpit/ ch in SOB, edema; LABS show FLP looks good on Lip10, BMet wnl on Lasix20+K20, CBC ok & TSH borderline (we are following)...    He saw Cherly Hensen 9/12 for f/u HBP, CAD, chr AFib on Coumadin, ASPVD> doing satis, cough resolved off Lisinopril (ch to Atacand), stable on meds & Coumadin- no changes made...    He saw DrGearhardt 11/12 for f/u thoraco-abdominal aneurysm, no surg due to age & comorbidities> f/u CT scan 12/12 showed severe emphysema & scarring w/ bibasilar atx (no infiltrates no cavities), markedly tortuous & ectatic Thor Ao w/ extensive atherosclerotic changes & no change in decr thor aneurysm measuring 5.9 x 5.6cm, mild cardiomeg & dense coronary calcif as well...    He saw DrDBrodie 8/12 for f/u esoph dysmotility & hx colon polyps > known presbyesoph, no stricture, denies swallowing prob; he had neg colonoscopy 2006 (w/o recurrent polyps, +divertics/ hems); no f/u colon needed due to age & comorbidities...    He saw DrWrenn 8/12 for f/u prostate cancer dx in 2009 & treated w/ XRT completed 6/09; slowly rising PSA w/ doubling time 6-78mo min voiding symptoms, he is considering androgen ablation if needed...  ~  March 22, 2012:  745moOV & GeTadeuszas been through a lot >> In Feb2013 he [facility] ~[facility]- starting 5cm distal to the left subclavian down to the prox aspect of there prev [name] graft;  They chose to proceed w/ a fenestrated endovasc repair> done 01/01/12;  Disch on Coumadin for his AFib & Lovenox bridge but he developed a complic w/ lumbar epidural hematomas w/ paraplegia  requiring readmission for laminectomy & evac of the hematomas;  [facility] w/ altered mental status, pneumonia, AFib w/ rvr, & FTT> Neuro w/u was otherw neg (& he improved w/ supportive care); CXR showed L>R bibasilar airsp dis (covered w/ IV antibiotics & disch on Avelox); AFib was rate controlled w/ med adjustments & Coumadin continued; Urinary retension required foley cath & Urology outpt management; his protimes got too thin & he developed hematuria & acute blood loss anemia; now BP has been low necessitating a HOLD on his BP med...  Thoracoabd aneurysm treated w/ fenestrated stent graft repair 4/13 by DrFarber [facility] >  Lumbar epidural hematomas w/ paraparesis requiring laminectomy 4/13 [facility] > ambulating w/ walker & gaining strength...  Bilat lower lobe pneumonias treated in-pt 5/13 and disch on Avelox > He is afeb & starting to feel better, actually looks pretty good (just pale); CXR today showed resolution of infiltrates...  AFib w/ RVR> meds adjusted and Coumadin carefully monitored; Heart rate= 62 off Atenolol; Protime=13.6/1.2 Off Coumadin x several days now; decision will need to be made regarding restart coumadin when safe...  Urinary retension from the lumbar surg and hematuria developing while on coumadin > he  has foley cath & leg bag w/ clear yellow urine at present; appt to see DrWrenn in 3d to consider further options (?voiding trial, in&out cath, etc)...  Hypotension w/ weakness & required down titration in BP meds & careful monitoring of BP etc > currently on Lasix40, K10Bid; BP= 118/66 and has varied 90's/70's to 120's/80's at home OFF his prev Aten25 & Losartan50;  Chem's are WNL & BNP=552...  Anemia > He developed acute blood loss anemia w/ post op Hg= 11-12 but dropped to 8.6  w/ hematuria & INR=4.2; Coumadin & ASA placed on HOLD; f/u labs showed Hg=8.4, Fe=31 (11%sat);  Rec to start FeSO4 Bid now...  Complaints of constipation > we reviewed the need for MIRALAX 17gm in water daily, and SENAKOT-S 2tabs at bedtime... We reviewed prob list, meds, xrays and labs> CXR 6/13 showed stable cardiac silhouette, sl incr markings chronically, NAD, no edema, Ao stent in desc Ao appears stable... LABS 6/13:  Chems- ok w/ BUN=22 Creat=1.4 BNP=552;  CBC- Hg=8.4 Fe=31(11%sat) B12=953;  Protime=13.6/ INR1.2;  TSH=6.66  ~  May 03, 2012:  6wk ROV & post hosp follow up> Jeziah presented to the ER 7/23 - 04/25/12 w/ abd pain- ELap surg revealed ~5 feet of frankly ischemic small bowel, likely due to internal hernia and closed loop obstruction=> Bowel resected & pulses intact w/ no evidence of embolic or thrombotic pathology;  Viable small bowel anastomosed & wound vac applied; he had a stable post op course=> foley left in place;  Disch 8/1 w/ home health etc...    Since disch we had call from son regarding pts Cardiac meds and BP> when last seen his BP was low & he was weak- Aten & Losartan placed on hold, continued on Lasix40, he's been off his Coumadin since 6/13 w/ hematuria/ anemia=> hasn't yet been restarted & only covered transiently in hosp w/ Lovenox, disch on ASA81, no coumadin;  BP since disch was still low (95/70) so Aten, Losartan kept on [place] continued...     He is pale & weak but gaining strength he says;  He saw Urology 8/8 w/ adeq voiding trial & Foley removed, voiding satis so far;  Since disch he's noted some pitting edema in LEs and L>R arm swelling (he had left IJ line in hosp)=> we decided to check VenDopplers ASAP & prelim report indicates no subclavian clots;  Needs Cards follow up & decision regarding restart of Coumadin w/ careful protime monitoring...  We reviewed prob list, meds, xrays and labs >> LABS 7/13 in hosp near disch> Hg=8.2, MCV=93, BS=ok,  K=3.3, Creat=0.85, Alb=3.2, LFTs=wnl... CXR 7/13 showed cardiomeg, intubated, low lung vols & bilat LL airsp dis L>R... 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  ~  May 23, 2012:  3wk ROV & Elam's swelling is diminished & his weight is down 13# to 173# today on Lasix40 & K10Bid;  BP= 112/70 & his strength is sl better w/ Losartan50 on HOLD & son is giving him Aten25 only if BP>110 sys... NOTE> he has restarted the ASA'81mg'$ /d but his COUMADIN is still on hold & pending Cardiology appt next week (Off Coumadin since 6/13 due to hematuria, then abd pain/ ELap, etc; he has had several coumadin complications)...    We reviewed prob list, meds, xrays and labs> see below for updates >>  Problem List:  COPD (ICD-496) Hx of PNEUMONIA, ORGANISM UNSPECIFIED (ICD-486) PULMONARY NODULE & CAVITY RLL (ICD-518.89) - he is an ex-smoker, having quit in [place] after 40 yrs of smoking... he was exercising regularly and walking daily ~59m 5-6 days per week... on [place]daily, +MUCINEX 2Bid w/ Fluids... ~  baseline CXR & CTChest w/ marked emphysema, atheromatous calcif, 5cm desc thorAA...  ~  CXR & CTA 2/11 in hosp showed cardiomeg + coronary calcif, advanced atherosclerosis of Ao w/ aneuryms 5.8cm into abd, underlying emphysema w/ interst edema & bilat effusions, no PE... ~  4/11:  RLL pneumonia which was slow to clear... ~  6/11:  f/u CXR & CT Chest 6/11 w/ 6cm cavitary area RLL & nodular component inferiorly, +hilar adenopathy, no mediastinal nodes, etc; Tumor markers showed CEA=7.5 & Ca19-9= 10.8; > IR felt lesion was too hi risk for bx, therefore contin Rx & observe. ~  8/11:  f/u CXR w/o change in RLL opac (no worsening)>> repeat CEA=6.5; plan f/u CT Chest in 249mo~  10/11: f/u CT Chest showed interval decrease in size of RLL cavitary lesion & the inferiorly placed soft tissue  component; otherw there is severe emphysema, cardiomeg, coronary calcif, thoracoabd aneurysm w/o change; there was an air-fluid level in a dilated esoph> check Ba Esophagram= Nonspecific esophageal motility disorder with very poor primary esophageal contractions (no HH, stricture, etc)... ~  12/12: f/u CTChest showed severe emphysema & scarring w/ bibasilar atx (no infiltrates no cavities), markedly tortuous & ectaticThorAo w/ extensive atherosclerotic changes & no change in decr thor aneurysm measuring 5.9 x 5.6cm, mild cardiomeg & dense coronary calcif as well... ~  4/13:  He had a fenestrated end[facility]asc [facility] ~  subseq CXRs thru 7/13 w/ cardiomeg, low lung vols, bibasilar airsp dis, etc...   HYPERTENSION (ICD-401.9)                         << followed by DrCherly Hensenor Cards >> CONGESTIVE HEART FAILURE (ICD-428.0) ATRIAL FIBRILLATION (ICD-427.31) w/ clot in left atrial appendage >> resolved on f/u TEE, on COUMADIN per Cards & cardioverted. PREMATURE VENTRICULAR CONTRACTIONS, FREQUENT (ICD-427.69)  MEDS> prev on ATENOLOL '25mg'$ /d (on hold), LOSARTAN '50mg'$ /d (on hold), & LASIX '20mg'$ -2AM + KCl 1040mid... prev Norvasc discontinued & hx ACE cough in the past on Lisinopril. ~  NuclearStressTest 1/05 was neg- no ischemia or infarct (+diaphrag attenuation), EF=56%... ~  repeat Nuclear study 6/09 was neg- no ischemia, mild inferoapic thinning, not gated due to PVCs... ~  2DEcho 6/09 showed mild dilated LV w/ EF= 45-50% but no regional wall motion abn, mild AoV calcif & AI, LA mild dil... ~  12/10:  [place]and [place] $$ ~  2/11:  in hosp- Cath= mild nonobstructive 3 vessel CAD, mod LVD w/ EF=40-45%; TEE= CHF w/ EF~45% w/ HK & left atrial appendage clot & AFib; he was diuresed & Norvasc stopped, placed on Coumadin w/ careful f/u by DrNCherly Hensenthe CC... ~  [facility]/ [facility]... ~  Recurrent AFib w/ RVR after his  Thoracoabdominal stent graft 4/13... ~  6/13:  BP was low assoc w/ GU bleeding & ATENOLOL25 & LOSARTAN50 placed on HOLD... ~  COUMADIN on HOLD since GU bleeding & acute blood loss anemia; it has not yet been restarted... ~  8/13:  BP= 116/78 on LASIX '40mg'$ /d & K10-Bid;  Wt down 14# to  173#  PERIPHERAL VASCULAR DISEASE (ICD-443.9) - back on ASA '81mg'$ /d... he is s/p infrarenal AAA repair w/ right common iliac aneurysm repair (via Ao Bi-iliac graft) in 1998 by DrLawson... also has a known desc thor AA measuring ~5+cm and followed by DrGearhardt periodically... ~  seen by DrGearhart 4/10 w/ CT scan showing ~5cm thoracoabdominal aneurysm w/o signif change...  ~  seen by DrGearhart 2/11 in hosp w/ sl incr size of AAA- f/u planned 27mo ~  also followed by [facility]- DrCooper. ~  seen by DrGearhardt 10/11 & stable, no change, f/u 1 yr. ~  Seen 12/12 by DrGearhardt & CTscan is stable, continue conservative approach... ~  2/13:  [facility] ~  4/13:  He had a fenestrated endovasc [facility]  HYPERCHOLESTEROLEMIA (ICD-272.0) - on LIPITOR '10mg'$ /d...  ~  FFithian5/07 showed TChol 115, Tg 58, HDL 36, LDL 67 ~  FLP 5/08 showed TChol 118, TG 66, HDL 31, LDL 74 ~  FLP 5/09 showed TChol 126, TG 71, HDL 37, LDL 75 ~  FLP 6/10 showed TChol 116, TG 73, HDL 40, LDL 62 ~  2/11:  FLP not checked during the hospitalization... ~  FLP 4/11 in hosp showed TChol 94, TG 52, HDL 23, LDL 61 ~  FLP 12/12 on Lip10 showed TChol 107, TG 38, HDL 40, LDL 60  DIVERTICULOSIS OF COLON (ICD-562.10) & COLONIC POLYPS (ICD-211.3) - hx polyps in 2003 = tubular adenoma... ~  last colonoscopy 9/06 showed divertics, hems, no polyps.  PROSTATE CANCER (ICD-185) - eval by DrWrenn and they decided on XRT by DrKinard- finished 5/09 and he states that DrKinard "released me"... he sees DrWrenn every 6 months & they follow PSA closely (notes reviewed)... ~  8/12: f/u  prostate cancer dx in 2009 & treated w/ XRT completed 6/09; slowly rising PSA w/ doubling time 6-93momin voiding symptoms, he is considering androgen ablation if needed... ~  6/13: He developed hematuria & urinary retention requiring Urology eval, discontinuation of Coumadin, & Foley placement=> subseq removed 8/13 w/ adeq voiding trial...  DEGENERATIVE JOINT DISEASE (ICD-715.90) - s/p bilat TKR's, [facility]- DrOlin... He has OXY-IR '5mg'$  as needed for pain...  ACTINIC SKIN DAMAGE (ICD-692.70) - followed by DrBrunetta Jeanshe knows to avoid sun exposure, use sun screen, etc...  ANEMIA >> ~  Labs 6/13 showed Hg= 8.4, Fe= 31 (11%sat); Rec to start FeSO4 Bid... ~  Labs 7/13 in hosp showed Hg= 12.6=>8.2 at disch... ~  Labs 8/13 showed Hg= 9.5, MCV= 94; rec to continue FeSO4 Bid...   Past Surgical History  Procedure Date  . Total knee arthroplasty     bilateral  . Abdominal aortic aneurysm repair 1998  . Appendectomy   . Inguinal hernia repair     left  . Joint replacement   . Laparotomy 04/16/2012    Procedure: EXPLORATORY LAPAROTOMY;  Surgeon: BrMadilyn HookDO;  [facility]: [facility];  Laterality: N/A;  exploratory Laparotomy,Small bowel ressection abdominal wound vac placement.  . Laparotomy 04/18/2012    Procedure: EXPLORATORY LAPAROTOMY;  Surgeon: ThJoyice FasterCornett, MD;  [facility] [facility]: General;  Laterality: N/A;  exploratory laparotomy with small bowel anastomosis  . Application of wound vac 04/18/2012    Procedure: APPLICATION OF WOUND VAC;  Surgeon: ThJoyice FasterCornett, MD;  [place] [facility] Service: General;  Laterality: N/A;  Removal of abdominal wound vac, Application of incisional  wound vac    Outpatient Encounter Prescriptions as of 05/23/2012  Medication Sig Dispense Refill  . ADVAIR DISKUS 250-50 MCG/DOSE AEPB Inhale 1 puff into the lungs Twice daily.      [name] aspirin 81 MG tablet Take 81 mg by mouth daily.        [name] atenolol (TENORMIN) 25 MG tablet take 1  tablet by mouth once daily  30 tablet  11  . atorvastatin (LIPITOR) 10 MG tablet Take 1 tablet (10 mg total) by mouth daily.  90 tablet  0  . ferrous sulfate 325 (65 FE) MG tablet Take 325 mg by mouth 2 (two) times daily.      . furosemide (LASIX) 40 MG tablet Take 40 mg by mouth daily.       [name] guaiFENesin (MUCINEX) 600 MG 12 hr tablet Take 600 mg by mouth 2 (two) times daily. COPD.      [name] losartan (COZAAR) 100 MG tablet Take 50 mg by mouth daily.      . Multiple Vitamin (MULTIVITAMIN) tablet Take 1 tablet by mouth daily.        . potassium chloride (K-DUR) 10 MEQ tablet Take 1 tablet (10 mEq total) by mouth 2 (two) times daily.  180 tablet  0  . Tamsulosin HCl (FLOMAX) 0.4 MG CAPS Take 0.4 mg by mouth at bedtime.       [name] tiotropium (SPIRIVA HANDIHALER) 18 MCG inhalation capsule Place 1 capsule (18 mcg total) into inhaler and inhale daily.  90 capsule  3    No Known Allergies   Current Medications, Allergies, Past Medical History, Past Surgical History, Family History, and Social History were reviewed in [employer] record.   Review of Systems        See HPI - all other systems neg except as noted...  The patient complains of dyspnea on exertion.  The patient denies anorexia, fever, weight loss, weight gain, vision loss, decreased hearing, hoarseness, chest pain, syncope, peripheral edema, prolonged cough, headaches, hemoptysis, abdominal pain, melena, hematochezia, severe indigestion/heartburn, hematuria, incontinence, muscle weakness, suspicious skin lesions, transient blindness, difficulty walking, depression, unusual weight change, abnormal bleeding, enlarged lymph nodes, and angioedema.     Objective:   Physical Exam    WD, WN, 78 y/o WM in NAD... GENERAL:  Alert & oriented; pleasant & cooperative; pale complexion... HEENT:  [place]/AT, EOM-wnl, PERRLA, EACs-clear, TMs-wnl, NOSE-clear, THROAT-clear & wnl. NECK:  Supple w/ fairROM; no JVD; normal carotid impulses  w/o bruits; no thyromegaly or nodules palpated; no lymphadenopathy. CHEST:  decr BS bilat, scat bibasilar rales, w/o wheezing/ rhonchi/ signs of consolidation. HEART:  irregular rhythm, gr1/6 diast murmur in Ao area, without rubs or gallops detected... ABDOMEN:  Soft & nontender; normal bowel sounds; no organomegaly or masses palpated... EXT: s/p bilat TKR's, mod arthritic changes; no varicose veins/ +venous insuffic/ no edema. NEURO:  CN's intact;  gait abn; no focal neuro deficits... DERM:  No lesions noted; no rash etc...  RADIOLOGY DATA:  Reviewed in the EPIC EMR & discussed w/ the patient...  LABORATORY DATA:  Reviewed in the EPIC EMR & discussed w/ the patient...   Assessment:      COPD, Hx Pneumonia>  Prev CXR/CT abn has resolved back to baseline severe COPD/Emphysema; then developed bilat lower lobe airsp dis/ atx & encouraged to cough & deep breathe...  HBP>  Prev controlled on Aten50-1/2, Lasix20, Losartan100-1/2, K10; He called w/ weakness & hypotension- doses were 1st cut back, then meds held, & he  is improved now on Lasix alone & we will continue to monitor...  CAD/ CHF/ etc>  Followed by Cherly Hensen & his prev notes are reviewed;  BNP is stable; he has f/u appt soon for decision regarding restart Coumadin...  AFib>  On above + Coumadin followed in the CC but on hold now w/ hematuria & anemia; this will need to be restarted & rec f/u Cards...  Periph Vasc Dis>  S/p AAA repair 1998 by Sheryn Bison; then followed by DrGearhardt for thoraco-abd aneurysm & referred to [facility] for stent graft surg- done 4/13 by DrFarber w/ complications as noted...  Ven Insuffic & EDEMA>  He has some LE pitting & swelling L>R hand/ arm; VenDopplers 8/13 left arm w/o subclavian DVT- rec elevation, no salt, continue Lasix40/d...  CHOL>  FLP looks good on Lip10...  GI> Presbyesoph, Divertics, Hx polpyps>  Stable & followed by DrDBrodie... He was Hosp again 7/13 w/ adb pain, Elap showed ischemic bowel fron int  hernia & closed loop obstruction- required bowel resection & anastomosis...  Prostate Cancer>  Followed by DrWrenn  w/ slowly rising PSA as noted; then developed urinary retention after lumbar surg and hematuria after foley & coumadin; now urine has cleared off coumadin & he has f/u appt w/ DrWrenn in 3d to decide on further course...  DJD>  Followed by DrOlin, s/p bilat TKRs...  ANEMIA>  He developed acute blood loss anemia w/ post op Hg= 11-12 but dropped to 8.6 w/ hematuria & INR=4.2; Coumadin & ASA placed on HOLD; f/u labs showed Hg=8.4 & Fe=31 & started on FeSO4 Bid;  Now Hg=9.5 & rec to continue Fe Bid...  Actinic skin changes>  Aware, he is monitored by Derm...     Plan:     Patient's Medications  New Prescriptions   No medications on file  Previous Medications   ADVAIR DISKUS 250-50 MCG/DOSE AEPB    Inhale 1 puff into the lungs Twice daily.   ASPIRIN 81 MG TABLET    Take 81 mg by mouth daily.     ATENOLOL (TENORMIN) 25 MG TABLET    take 1 tablet by mouth once daily   ATORVASTATIN (LIPITOR) 10 MG TABLET    Take 1 tablet (10 mg total) by mouth daily.   FERROUS SULFATE 325 (65 FE) MG TABLET    Take 325 mg by mouth 2 (two) times daily.   FUROSEMIDE (LASIX) 40 MG TABLET    Take 40 mg by mouth daily.    GUAIFENESIN (MUCINEX) 600 MG 12 HR TABLET    Take 600 mg by mouth 2 (two) times daily. COPD.   LOSARTAN (COZAAR) 100 MG TABLET    Take 50 mg by mouth daily.   MULTIPLE VITAMIN (MULTIVITAMIN) TABLET    Take 1 tablet by mouth daily.     POTASSIUM CHLORIDE (K-DUR) 10 MEQ TABLET    Take 1 tablet (10 mEq total) by mouth 2 (two) times daily.   TAMSULOSIN HCL (FLOMAX) 0.4 MG CAPS    Take 0.4 mg by mouth at bedtime.    TIOTROPIUM (SPIRIVA HANDIHALER) 18 MCG INHALATION CAPSULE    Place 1 capsule (18 mcg total) into inhaler and inhale daily.  Modified Medications   No medications on file  Discontinued Medications   No medications on file

## 2012-05-29 ENCOUNTER — Ambulatory Visit: Payer: Medicare Other | Admitting: Cardiovascular Disease

## 2012-05-29 ENCOUNTER — Ambulatory Visit (INDEPENDENT_AMBULATORY_CARE_PROVIDER_SITE_OTHER): Payer: Medicare Other | Admitting: Cardiovascular Disease

## 2012-05-29 VITALS — BP 104/70 | HR 68 | Ht 69.0 in | Wt 173.0 lb

## 2012-05-29 DIAGNOSIS — I4891 Unspecified atrial fibrillation: Secondary | ICD-10-CM

## 2012-05-29 DIAGNOSIS — I712 Thoracic aortic aneurysm, without rupture, unspecified: Secondary | ICD-10-CM

## 2012-05-29 DIAGNOSIS — E7800 Pure hypercholesterolemia, unspecified: Secondary | ICD-10-CM

## 2012-05-29 DIAGNOSIS — K56609 Unspecified intestinal obstruction, unspecified as to partial versus complete obstruction: Secondary | ICD-10-CM

## 2012-05-29 DIAGNOSIS — I10 Essential (primary) hypertension: Secondary | ICD-10-CM

## 2012-05-29 NOTE — Assessment & Plan Note (Signed)
Will need to get records from [facility] No palbable AAA S/O open surgery and stent procedure

## 2012-05-29 NOTE — Assessment & Plan Note (Signed)
Well controlled.  Continue current medications and low sodium Dash type diet.    

## 2012-05-29 NOTE — Patient Instructions (Addendum)
Your physician recommends that you schedule a follow-up appointment in: 3 MONTHS WITH DR [facility] Your physician has recommended you make the following change in your medication: RESTART COUMADIN  2.5 MG EVERY DAY  AS DIRECTED NEEDS COUMADIN CLINIC APPT NEXT  TUES OR WED.

## 2012-05-29 NOTE — Assessment & Plan Note (Signed)
Cholesterol is at goal.  Continue current dose of statin and diet Rx.  No myalgias or side effects.  F/U  LFT's in 6 months. Lab Results  Component Value Date   LDLCALC 60 08/30/2011             

## 2012-05-29 NOTE — Progress Notes (Signed)
Patient ID: Ryan Hess, male   DOB: September 20, 1924, 78 y.o.   MRN: [id number] Puglia returns today for followup. He had a horendous 6 monthsBut is doing better now.   He has a history of aortic aneurysm repair by Dr. Tyrone Sage. He has residual  descending thoracic aneurysm that was stented at [facility] in [place]  Apparantly this was complicated by cord edema and he had to have a lumbar procedure. He has hypertension. The last time I saw him had a bit of a cough and we stopped his lisinopril and switched him to Atacand. His cough seems to be improved. He is not having any chest pain, PND, or orthopnea. He is active. He walks on a regular basis. He tends towards bradycardia and has had occasional PVCs. Chronic afib on coumadin with no bleeding problems He had a Myoview study done March 24, 2008, which was nonischemic. EF was not calculated due to PVCs. His EF by echo on March 24, 2008, was 45-50%. We had him on low-dose atenolol and this seems to have settled him down.   After stent he subsequently had ischemic bowel which required emergent surgery with secondary closure.  Skin care nurse still coming out 3x/week.  Been off coumadin since surgery the first week in August    ROS: Denies fever, malais, weight loss, blurry vision, decreased visual acuity, cough, sputum, SOB, hemoptysis, pleuritic pain, palpitaitons, heartburn, abdominal pain, melena, lower extremity edema, claudication, or rash.  All other systems reviewed and negative  General: Affect appropriate Healthy:  appears stated age HEENT: normal Neck supple with no adenopathy JVP normal no bruits no thyromegaly Lungs clear with no wheezing and good diaphragmatic motion Heart:  S1/S2 no murmur, no rub, gallop or click PMI normal Abdomen: benighn, BS positve, no tenderness, no AAA wound healing well no bruit.  No HSM or HJR Distal pulses intact with no bruits No edema Neuro non-focal Skin warm and dry No muscular weakness   Current Outpatient  Prescriptions  Medication Sig Dispense Refill  . ADVAIR DISKUS 250-50 MCG/DOSE AEPB Inhale 1 puff into the lungs Twice daily.      [name] aspirin 81 MG tablet Take 81 mg by mouth daily.       [name] atenolol (TENORMIN) 25 MG tablet take 1 tablet by mouth once daily  30 tablet  11  . atorvastatin (LIPITOR) 10 MG tablet Take 1 tablet (10 mg total) by mouth daily.  90 tablet  0  . ferrous sulfate 325 (65 FE) MG tablet Take 325 mg by mouth 2 (two) times daily.      . furosemide (LASIX) 40 MG tablet Take 40 mg by mouth daily.       [name] guaiFENesin (MUCINEX) 600 MG 12 hr tablet Take 600 mg by mouth 2 (two) times daily. COPD.      . Multiple Vitamin (MULTIVITAMIN) tablet Take 1 tablet by mouth daily.        . potassium chloride (K-DUR) 10 MEQ tablet Take 1 tablet (10 mEq total) by mouth 2 (two) times daily.  180 tablet  0  . Tamsulosin HCl (FLOMAX) 0.4 MG CAPS Take 0.4 mg by mouth at bedtime.       [name] tiotropium (SPIRIVA HANDIHALER) 18 MCG inhalation capsule Place 1 capsule (18 mcg total) into inhaler and inhale daily.  90 capsule  3  . DISCONTD: pantoprazole (PROTONIX) 40 MG tablet Take 1 tablet (40 mg total) by mouth daily.  30 tablet  5    Allergies  Review of patient's allergies indicates no known allergies.  Electrocardiogram:  Assessment and Plan

## 2012-05-29 NOTE — Assessment & Plan Note (Signed)
Resolved post surgery for ischemic bowel  F/U Dr Biagio Quint Skin healing well

## 2012-05-29 NOTE — Assessment & Plan Note (Signed)
Good rate control  Resume coumadin.  F/U Clinic next week

## 2012-06-03 ENCOUNTER — Encounter (INDEPENDENT_AMBULATORY_CARE_PROVIDER_SITE_OTHER): Payer: Self-pay | Admitting: General Surgery

## 2012-06-03 ENCOUNTER — Ambulatory Visit (INDEPENDENT_AMBULATORY_CARE_PROVIDER_SITE_OTHER): Payer: Medicare Other | Admitting: General Surgery

## 2012-06-03 VITALS — BP 132/78 | HR 68 | Temp 98.0°F | Resp 16 | Ht 69.0 in | Wt 173.6 lb

## 2012-06-03 DIAGNOSIS — Z4889 Encounter for other specified surgical aftercare: Secondary | ICD-10-CM

## 2012-06-03 DIAGNOSIS — Z5189 Encounter for other specified aftercare: Secondary | ICD-10-CM

## 2012-06-03 NOTE — Progress Notes (Signed)
Subjective:     Patient ID: Ryan Hess, male   DOB: 19-Dec-1923, 78 y.o.   MRN: [id number]  HPI This patient follows up for a wound check status post exploratory laparotomy and small bowel resection for incarcerated history included internal hernia. He says he has been maintaining his weight and is taking a multivitamin and some insurance he seems to be gaining much of his strength back although it is still somewhat limited in his strength compared to preoperative.  He had a centration labs checked by his primary care physician which he tells me her normal but I do not have the results today. He has no pain and no complaints.  Review of Systems     Objective:   Physical Exam No acute distress and nontoxic-appearing His incision is healing well without sign of infection. He has some proud granulation tissue in the midline but his incision is almost completely healed. I applied some silver nitrate to the area of proud granulation tissue and redressed the wound.    Assessment:     Aftercare following surgery He seems to be recovering very well. He does not appear to have any side effects from his surgery. His incision is almost completely healed. I suspect that this should be healed with another 2-3 weeks. He should continue with current wound care. I will see him back in about 3 weeks and his incision should be healed by then.    Plan:     F/u in 3 weeks for wound check

## 2012-06-05 ENCOUNTER — Ambulatory Visit (INDEPENDENT_AMBULATORY_CARE_PROVIDER_SITE_OTHER): Payer: Medicare Other | Admitting: *Deleted

## 2012-06-05 DIAGNOSIS — I4891 Unspecified atrial fibrillation: Secondary | ICD-10-CM

## 2012-06-05 LAB — POCT INR: INR: 1.6

## 2012-06-12 ENCOUNTER — Ambulatory Visit (INDEPENDENT_AMBULATORY_CARE_PROVIDER_SITE_OTHER): Payer: Medicare Other | Admitting: Pharmacist

## 2012-06-12 DIAGNOSIS — I4891 Unspecified atrial fibrillation: Secondary | ICD-10-CM

## 2012-06-12 LAB — POCT INR: INR: 3.3

## 2012-06-25 ENCOUNTER — Encounter (INDEPENDENT_AMBULATORY_CARE_PROVIDER_SITE_OTHER): Payer: Medicare Other | Admitting: General Surgery

## 2012-06-26 ENCOUNTER — Ambulatory Visit (INDEPENDENT_AMBULATORY_CARE_PROVIDER_SITE_OTHER): Payer: Medicare Other | Admitting: Pharmacist

## 2012-06-26 DIAGNOSIS — I4891 Unspecified atrial fibrillation: Secondary | ICD-10-CM

## 2012-06-26 LAB — POCT INR: INR: 2.2

## 2012-07-02 ENCOUNTER — Telehealth: Payer: Self-pay | Admitting: Pulmonary Disease

## 2012-07-02 MED ORDER — POTASSIUM CHLORIDE ER 10 MEQ PO TBCR: 10.0000 meq | EXTENDED_RELEASE_TABLET | Freq: Two times a day (BID) | ORAL | Status: DC

## 2012-07-02 MED ORDER — ATORVASTATIN CALCIUM 10 MG PO TABS: 10.0000 mg | ORAL_TABLET | Freq: Every day | ORAL | Status: DC

## 2012-07-02 NOTE — Telephone Encounter (Signed)
Both rx have been sent in to [facility] per pts request. Called and spoke with pt and he is aware. Nothing further is needed.

## 2012-07-08 ENCOUNTER — Encounter: Payer: Self-pay | Admitting: Pulmonary Disease

## 2012-07-08 ENCOUNTER — Ambulatory Visit (INDEPENDENT_AMBULATORY_CARE_PROVIDER_SITE_OTHER): Payer: Medicare Other | Admitting: Pulmonary Disease

## 2012-07-08 ENCOUNTER — Encounter: Payer: Self-pay | Admitting: *Deleted

## 2012-07-08 ENCOUNTER — Other Ambulatory Visit (INDEPENDENT_AMBULATORY_CARE_PROVIDER_SITE_OTHER): Payer: Medicare Other

## 2012-07-08 VITALS — BP 114/82 | HR 96 | Temp 96.4°F | Ht 69.0 in | Wt 176.4 lb

## 2012-07-08 DIAGNOSIS — I4891 Unspecified atrial fibrillation: Secondary | ICD-10-CM

## 2012-07-08 DIAGNOSIS — I739 Peripheral vascular disease, unspecified: Secondary | ICD-10-CM

## 2012-07-08 DIAGNOSIS — C61 Malignant neoplasm of prostate: Secondary | ICD-10-CM

## 2012-07-08 DIAGNOSIS — D500 Iron deficiency anemia secondary to blood loss (chronic): Secondary | ICD-10-CM

## 2012-07-08 DIAGNOSIS — K5730 Diverticulosis of large intestine without perforation or abscess without bleeding: Secondary | ICD-10-CM

## 2012-07-08 DIAGNOSIS — M1990 Unspecified osteoarthritis, unspecified site: Secondary | ICD-10-CM

## 2012-07-08 DIAGNOSIS — I10 Essential (primary) hypertension: Secondary | ICD-10-CM

## 2012-07-08 DIAGNOSIS — J4489 Other specified chronic obstructive pulmonary disease: Secondary | ICD-10-CM

## 2012-07-08 DIAGNOSIS — E7800 Pure hypercholesterolemia, unspecified: Secondary | ICD-10-CM

## 2012-07-08 DIAGNOSIS — I5022 Chronic systolic (congestive) heart failure: Secondary | ICD-10-CM

## 2012-07-08 DIAGNOSIS — I712 Thoracic aortic aneurysm, without rupture, unspecified: Secondary | ICD-10-CM

## 2012-07-08 DIAGNOSIS — I509 Heart failure, unspecified: Secondary | ICD-10-CM

## 2012-07-08 DIAGNOSIS — J449 Chronic obstructive pulmonary disease, unspecified: Secondary | ICD-10-CM

## 2012-07-08 LAB — BASIC METABOLIC PANEL
BUN: 25 mg/dL — ABNORMAL HIGH (ref 6–23)
CO2: 29 mEq/L (ref 19–32)
Calcium: 9.3 mg/dL (ref 8.4–10.5)
Chloride: 105 mEq/L (ref 96–112)
Creatinine, Ser: 1.2 mg/dL (ref 0.4–1.5)
GFR: 59.76 mL/min — ABNORMAL LOW (ref >60.00)
Glucose, Bld: 88 mg/dL (ref 70–99)
Potassium: 4.9 mEq/L (ref 3.5–5.1)
Sodium: 143 mEq/L (ref 135–145)

## 2012-07-08 LAB — CBC WITH DIFFERENTIAL/PLATELET
Basophils Absolute: 0.1 10*3/uL (ref 0.0–0.1)
Basophils Relative: 1.3 % (ref 0.0–3.0)
Eosinophils Absolute: 0.2 10*3/uL (ref 0.0–0.7)
Eosinophils Relative: 2.3 % (ref 0.0–5.0)
HCT: 40.4 % (ref 39.0–52.0)
Hemoglobin: 13.2 g/dL (ref 13.0–17.0)
Lymphocytes Relative: 17.4 % (ref 12.0–46.0)
Lymphs Abs: 1.2 10*3/uL (ref 0.7–4.0)
MCHC: 32.6 g/dL (ref 30.0–36.0)
MCV: 93.1 fl (ref 78.0–100.0)
Monocytes Absolute: 0.6 10*3/uL (ref 0.1–1.0)
Monocytes Relative: 9.3 % (ref 3.0–12.0)
Neutro Abs: 4.6 10*3/uL (ref 1.4–7.7)
Neutrophils Relative %: 69.7 % (ref 43.0–77.0)
Platelets: 138 10*3/uL — ABNORMAL LOW (ref 150.0–400.0)
RBC: 4.34 Mil/uL (ref 4.22–5.81)
RDW: 17.5 % — ABNORMAL HIGH (ref 11.5–14.6)
WBC: 6.7 10*3/uL (ref 4.5–10.5)

## 2012-07-08 NOTE — Progress Notes (Signed)
Subjective:     Patient ID: Ryan Hess, male   DOB: 1923-10-28, 78 y.o.   MRN: [id number]  HPI 78 y/o WM here for a follow up visit... he has multiple medical problems as noted below...    ~  August 28, 2011:  13 month ROV & he is remarkably w/o complaints at 78 y/o w/ multisystem disease... BP well controlled, denies CP/ palpit/ ch in SOB, edema; LABS show FLP looks good on Lip10, BMet wnl on Lasix20+K20, CBC ok & TSH borderline (we are following)...    He saw Ryan Hess 9/12 for f/u HBP, CAD, chr AFib on Coumadin, ASPVD> doing satis, cough resolved off Lisinopril (ch to Atacand), stable on meds & Coumadin- no changes made...    He saw Ryan Hess 11/12 for f/u thoraco-abdominal aneurysm, no surg due to age & comorbidities> f/u CT scan 12/12 showed severe emphysema & scarring w/ bibasilar atx (no infiltrates no cavities), markedly tortuous & ectatic Thor Ao w/ extensive atherosclerotic changes & no change in decr thor aneurysm measuring 5.9 x 5.6cm, mild cardiomeg & dense coronary calcif as well...    He saw Ryan Hess 8/12 for f/u esoph dysmotility & hx colon polyps > known presbyesoph, no stricture, denies swallowing prob; he had neg colonoscopy 2006 (w/o recurrent polyps, +divertics/ hems); no f/u colon needed due to age & comorbidities...    He saw Ryan Hess 8/12 for f/u prostate cancer dx in 2009 & treated w/ XRT completed 6/09; slowly rising PSA w/ doubling time 6-50mo min voiding symptoms, he is considering androgen ablation if needed...  ~  March 22, 2012:  79moOV & Ryan Hess been through a lot >> In Feb2013 he [facility]- starting 5cm distal to the left subclavian down to the prox aspect of there prev [name] graft;  They chose to proceed w/ a fenestrated endovasc repair> done 01/01/12;  Disch on Coumadin for his AFib & Lovenox bridge but he developed a complic w/ lumbar epidural hematomas w/ paraplegia  requiring readmission for laminectomy & evac of the hematomas;  [facility] by Ryan Hess w/ altered mental status, pneumonia, AFib w/ rvr, & FTT> Neuro w/u was otherw neg (& he improved w/ supportive care); CXR showed L>R bibasilar airsp dis (covered w/ IV antibiotics & disch on Avelox); AFib was rate controlled w/ med adjustments & Coumadin continued; Urinary retension required foley cath & Urology outpt management; his protimes got too thin & he developed hematuria & acute blood loss anemia; now BP has been low necessitating a HOLD on his BP med...  Thoracoabd aneurysm treated w/ fenestrated stent graft repair 4/13 by Ryan Hess [facility] >  Lumbar epidural hematomas w/ paraparesis requiring [facility]ctomy 4/13 [facility] > ambulating w/ walker & gaining strength...  Bilat lower lobe pneumonias treated in-pt 5/13 and disch on Avelox > He is afeb & starting to feel better, actually looks pretty good (just pale); CXR today showed resolution of infiltrates...  AFib w/ RVR> meds adjusted and Coumadin carefully monitored; Heart rate= 62 off Atenolol; Protime=13.6/1.2 Off Coumadin x several days now; decision will need to be made regarding restart coumadin when safe...  Urinary retension from the lumbar surg and hematuria developing while on coumadin > he  has foley cath & leg bag w/ clear yellow urine at present; appt to see Ryan Hess in 3d to consider further options (?voiding trial, in&out cath, etc)...  Hypotension w/ weakness & required down titration in BP meds & careful monitoring of BP etc > currently on Lasix40, K10Bid; BP= 118/66 and has varied 90's/70's to 120's/80's at home OFF his prev Aten25 & Losartan50;  Chem's are WNL & BNP=552...  Anemia > He developed acute blood loss anemia w/ post op Hg= 11-12 but dropped to 8.6  w/ hematuria & INR=4.2; Coumadin & ASA placed on HOLD; f/u labs showed Hg=8.4, Fe=31 (11%sat);  Rec to start FeSO4 Bid now...  Complaints of constipation > we reviewed the need for MIRALAX 17gm in water daily, and SENAKOT-S 2tabs at bedtime... We reviewed prob list, meds, xrays and labs> CXR 6/13 showed stable cardiac silhouette, sl incr markings chronically, NAD, no edema, Ao stent in desc Ao appears stable... LABS 6/13:  Chems- ok w/ BUN=22 Creat=1.4 BNP=552;  CBC- Hg=8.4 Fe=31(11%sat) B12=953;  Protime=13.6/ INR1.2;  TSH=6.66  ~  May 03, 2012:  6wk ROV & post hosp follow up> Ryan Hess presented to the ER 7/23 - 04/25/12 w/ abd pain- ELap surg revealed ~5 feet of frankly ischemic small bowel, likely due to internal hernia and closed loop obstruction=> Bowel resected & pulses intact w/ no evidence of embolic or thrombotic pathology;  Viable small bowel anastomosed & wound vac applied; he had a stable post op course=> foley left in place;  Disch 8/1 w/ home health etc...    Since disch we had call from son regarding pts Cardiac meds and BP> when last seen his BP was low & he was weak- Aten & Losartan placed on hold, continued on Lasix40, he's been off his Coumadin since 6/13 w/ hematuria/ anemia=> hasn't yet been restarted & only covered transiently in hosp w/ Lovenox, disch on ASA81, no coumadin;  BP since disch was still low (95/70) so Aten, Losartan kept on HOLD & Lasix40 & K10Bid continued...     He is pale & weak but gaining strength he says;  He saw Urology 8/8 w/ adeq voiding trial & Foley removed, voiding satis so far;  Since disch he's noted some pitting edema in LEs and L>R arm swelling (he had left IJ line in hosp)=> we decided to check VenDopplers ASAP & prelim report indicates no subclavian clots;  Needs Cards follow up & decision regarding restart of Coumadin w/ careful protime monitoring...  We reviewed prob list, meds, xrays and labs >> LABS 7/13 in hosp near disch> Hg=8.2, MCV=93, BS=ok,  K=3.3, Creat=0.85, Alb=3.2, LFTs=wnl... CXR 7/13 showed cardiomeg, intubated, low lung vols & bilat LL airsp dis L>R... 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  ~  May 23, 2012:  3wk ROV & Ryan Hess's swelling is diminished & his weight is down 13# to 173# today on Lasix40 & K10Bid;  BP= 112/70 & his strength is sl better w/ Losartan50 on HOLD & son is giving him Aten25 only if BP>110 sys... NOTE> he has restarted the ASA81mg /d but his COUMADIN is still on hold & pending Cardiology appt next week (Off Coumadin since 6/13 due to hematuria, then abd pain/ ELap, etc; he has had several coumadin complications)...    We reviewed prob list, meds, xrays and labs> see below for updates >>  ~  July 08, 2012:  6wk ROV & Ryan Hess is stable, slowly gaining strength, wt up 3# to 176# today, BP well regulated on his Lasix40 & takes the Aten25 only if BP>110;  Breathing stable on Advair250 + Spiriva & he uses the Mucinex as needed;  Due for f/u CBC & Fe on his FeSO4 325mg Bid...    We reviewed prob list, meds, xrays and labs> see below for updates >> OK Flu vaccine today... LABS 10/13:  Chems- wnl;  CBC- improved w/ Hg=13.2 & Fe looks good etc...          Problem List:  COPD (ICD-496) Hx of PNEUMONIA, ORGANISM UNSPECIFIED (ICD-486) PULMONARY NODULE & CAVITY RLL (ICD-518.89) - he is an ex-smoker, having quit in 1990 after 40 yrs of smoking... he was exercising regularly and walking daily ~63mi 5-6 days per week... on ADVAIR 250Bid & SPIRIVA daily, +MUCINEX 2Bid w/ Fluids... ~  baseline CXR & CTChest w/ marked emphysema, atheromatous calcif, 5cm desc thorAA...  ~  CXR & CTA 2/11 in hosp showed cardiomeg + coronary calcif, advanced atherosclerosis of Ao w/ aneuryms 5.8cm into abd, underlying emphysema w/ interst edema & bilat effusions, no PE... ~  4/11:  RLL pneumonia which was slow to clear... ~  6/11:   f/u CXR & CT Chest 6/11 w/ 6cm cavitary area RLL & nodular component inferiorly, +hilar adenopathy, no mediastinal nodes, etc; Tumor markers showed CEA=7.5 & Ca19-9= 10.8; > IR felt lesion was too hi risk for bx, therefore contin Rx & observe. ~  8/11:  f/u CXR w/o change in RLL opac (no worsening)>> repeat CEA=6.5; plan f/u CT Chest in 49mo. ~  10/11: f/u CT Chest showed interval decrease in size of RLL cavitary lesion & the inferiorly placed soft tissue component; otherw there is severe emphysema, cardiomeg, coronary calcif, thoracoabd aneurysm w/o change; there was an air-fluid level in a dilated esoph> check Ba Esophagram= Nonspecific esophageal motility disorder with very poor primary esophageal contractions (no HH, stricture, etc)... ~  12/12: f/u CTChest showed severe emphysema & scarring w/ bibasilar atx (no infiltrates no cavities), markedly tortuous & ectaticThorAo w/ extensive atherosclerotic changes & no change in decr thor aneurysm measuring 5.9 x 5.6cm, mild cardiomeg & dense coronary calcif as well... ~  4/13:  He had a fenestrated endovasc repair of Thoraco-abdominal AA by Ryan Hess at [facility]... ~  subseq CXRs thru 7/13 hosp w/ cardiomeg, low lung vols, bibasilar airsp dis, etc...   HYPERTENSION (ICD-401.9)                         << followed by Walker Kehr for Cards >> CONGESTIVE HEART FAILURE (ICD-428.0) ATRIAL FIBRILLATION (ICD-427.31) w/ clot in left atrial appendage >> resolved on f/u TEE, on COUMADIN per Cards & cardioverted. PREMATURE VENTRICULAR CONTRACTIONS, FREQUENT (ICD-427.69)  MEDS> prev on ATENOLOL 25mg /d (on hold- uses prn), & LASIX 20mg -2AM + KCl , and off Losartan now... prev Norvasc discontinued & hx ACE cough in the past on Lisinopril. ~  NuclearStressTest 1/05 was neg- no ischemia or infarct (+diaphrag attenuation), EF=56%... ~  repeat Nuclear study 6/09 was neg- no ischemia, mild inferoapic thinning, not gated due to PVCs... ~  2DEcho 6/09 showed mild dilated LV  w/ EF= 45-50% but no regional wall motion abn, mild AoV calcif & AI, LA mild dil... ~  12/10:  changed from Atacand to LOSARTAN to save $$ ~  2/11:  in hosp- Cath= mild nonobstructive 3 vessel CAD, mod LVD w/ EF=40-45%; TEE= CHF w/ EF~45% w/  HK & left atrial appendage clot & AFib; he was diuresed & Norvasc stopped, placed on Coumadin w/ careful f/u by Walker Kehr & the CC... ~  Subseq successful [facility] & holding NSR w/ PVCs... ~  Recurrent AFib w/ RVR after his Thoracoabdominal stent graft 4/13... ~  6/13:  BP was low assoc w/ GU bleeding & ATENOLOL25 & LOSARTAN50 placed on HOLD... ~  COUMADIN on HOLD since GU bleeding & acute blood loss anemia; it has not yet been restarted... ~  8/13:  BP= 116/78 on LASIX 40mg /d & K10-Bid;  Wt down 14# to 173# ~  10/13:  BP= 114/82 on Lasix40+K1-Bid (he takes Aten25 if BP>110 at home)...  PERIPHERAL VASCULAR DISEASE (ICD-443.9) - back on ASA 81mg /d... he is s/p infrarenal AAA repair w/ right common iliac aneurysm repair (via Ao Bi-iliac graft) in 1998 by DrLawson... also has a known desc thor AA measuring ~5+cm and followed by Ryan Hess periodically... ~  seen by DrGearhart 4/10 w/ CT scan showing ~5cm thoracoabdominal aneurysm w/o signif change...  ~  seen by DrGearhart 2/11 in hosp w/ sl incr size of AAA- f/u planned 63mo. ~  also followed by [facility]- DrCooper. ~  seen by Ryan Hess 10/11 & stable, no change, f/u 1 yr. ~  Seen 12/12 by Ryan Hess & CTscan is stable, continue conservative approach... ~  2/13:  He has eval in the Endovasc clinic [facility] by Ryan Hess... ~  4/13:  He had a fenestrated endovasc repair of Thoraco-abdominal AA by Ryan Hess at [facility], complic by lumbar epidural hematomas (on Coumadin) w/ paraplegia requiring readmission for laminectomy & evac of the hematomas;  He was sent to [facility] for rehab 4/18 - 01/30/12 and then disch to SNF [facility]) but only spent 4d there before he had to be readmit to [facility] 5/11 - 02/09/12 by Ryan Hess w/ altered mental  status, pneumonia, AFib w/ rvr, & FTT> Neuro w/u was otherw neg (& he improved w/ supportive care)...  HYPERCHOLESTEROLEMIA (ICD-272.0) - on LIPITOR 10mg /d...  ~  FLP 5/07 showed TChol 115, Tg 58, HDL 36, LDL 67 ~  FLP 5/08 showed TChol 118, TG 66, HDL 31, LDL 74 ~  FLP 5/09 showed TChol 126, TG 71, HDL 37, LDL 75 ~  FLP 6/10 showed TChol 116, TG 73, HDL 40, LDL 62 ~  2/11:  FLP not checked during the hospitalization... ~  FLP 4/11 in hosp showed TChol 94, TG 52, HDL 23, LDL 61 ~  FLP 12/12 on Lip10 showed TChol 107, TG 38, HDL 40, LDL 60  DIVERTICULOSIS OF COLON (ICD-562.10) & COLONIC POLYPS (ICD-211.3) - hx polyps in 2003 = tubular adenoma... ~  last colonoscopy 9/06 showed divertics, hems, no polyps.  ISCHEMIC BOWEL >> Ryan Hess presented to the ER 7/23 - 04/25/12 w/ abd pain- ELap surg revealed ~5 feet of frankly ischemic small bowel, likely due to internal hernia and closed loop obstruction=> Bowel resected & pulses intact w/ no evidence of embolic or thrombotic pathology;  Viable small bowel anastomosed & wound vac applied; he had a stable post op course...  PROSTATE CANCER (ICD-185) - eval by Ryan Hess and they decided on XRT by DrKinard- finished 5/09 and he states that DrKinard "released me"... he sees Ryan Hess every 6 months & they follow PSA closely (notes reviewed)... ~  8/12: f/u prostate cancer dx in 2009 & treated w/ XRT completed 6/09; slowly rising PSA w/ doubling time 6-21mo; min voiding symptoms, he is considering androgen ablation if needed... ~  6/13: He developed hematuria &  urinary retention requiring Urology eval, discontinuation of Coumadin, & Foley placement=> subseq removed 8/13 w/ adeq voiding trial...  DEGENERATIVE JOINT DISEASE (ICD-715.90) - s/p bilat TKR's, [facility]- DrOlin... He has OXY-IR 5mg  as needed for pain...  ACTINIC SKIN DAMAGE (ICD-692.70) - followed by Elnora Morrison, he knows to avoid sun exposure, use sun screen, etc...  ANEMIA >> ~  Labs 6/13 showed Hg=  8.4, Fe= 31 (11%sat); Rec to start FeSO4 Bid... ~  Labs 7/13 in hosp showed Hg= 12.6=>8.2 at disch... ~  Labs 8/13 showed Hg= 9.5, MCV= 94; rec to continue FeSO4 Bid... ~  Labs 10/13 showed Hg= 13.2 & ok to wean Fe to 1/d til gone then stop...   Past Surgical History  Procedure Date  . Total knee arthroplasty     bilateral  . Abdominal aortic aneurysm repair 1998  . Appendectomy   . Inguinal hernia repair     left  . Joint replacement   . Laparotomy 04/16/2012    Procedure: EXPLORATORY LAPAROTOMY;  Surgeon: Lodema Pilot, DO;  Location: [facility] OR;  Service: General;  Laterality: N/A;  exploratory Laparotomy,Small bowel ressection abdominal wound vac placement.  . Laparotomy 04/18/2012    Procedure: EXPLORATORY LAPAROTOMY;  Surgeon: Clovis Pu. Cornett, MD;  Location: [facility] OR;  Service: General;  Laterality: N/A;  exploratory laparotomy with small bowel anastomosis  . Application of wound vac 04/18/2012    Procedure: APPLICATION OF WOUND VAC;  Surgeon: Clovis Pu. Cornett, MD;  Location: [facility] OR;  Service: General;  Laterality: N/A;  Removal of abdominal wound vac, Application of incisional  wound vac    Outpatient Encounter Prescriptions as of 07/08/2012  Medication Sig Dispense Refill  . ADVAIR DISKUS 250-50 MCG/DOSE AEPB Inhale 1 puff into the lungs Twice daily.      [name] aspirin 81 MG tablet Take 81 mg by mouth daily.       [name] atenolol (TENORMIN) 25 MG tablet take 1 tablet by mouth once daily  30 tablet  11  . atorvastatin (LIPITOR) 10 MG tablet Take 1 tablet (10 mg total) by mouth daily.  90 tablet  3  . ferrous sulfate 325 (65 FE) MG tablet Take 325 mg by mouth 2 (two) times daily.      . furosemide (LASIX) 40 MG tablet Take 40 mg by mouth daily.       [name] guaiFENesin (MUCINEX) 600 MG 12 hr tablet Take 600 mg by mouth 2 (two) times daily. COPD.      . Multiple Vitamin (MULTIVITAMIN) tablet Take 1 tablet by mouth daily.        [name] oxyCODONE (OXY IR/ROXICODONE) 5 MG immediate release tablet       .  potassium chloride (K-DUR) 10 MEQ tablet Take 1 tablet (10 mEq total) by mouth 2 (two) times daily.  180 tablet  3  . Tamsulosin HCl (FLOMAX) 0.4 MG CAPS Take 0.4 mg by mouth at bedtime.       [name] tiotropium (SPIRIVA HANDIHALER) 18 MCG inhalation capsule Place 1 capsule (18 mcg total) into inhaler and inhale daily.  90 capsule  3  . warfarin (COUMADIN) 2.5 MG tablet Take 2.5 mg by mouth as directed.      [name] ZYVOX 2 MG/ML IVPB         No Known Allergies   Current Medications, Allergies, Past Medical History, Past Surgical History, Family History, and Social History were reviewed in [employer] record.   Review of Systems  See HPI - all other systems neg except as noted...  The patient complains of dyspnea on exertion.  The patient denies anorexia, fever, weight loss, weight gain, vision loss, decreased hearing, hoarseness, chest pain, syncope, peripheral edema, prolonged cough, headaches, hemoptysis, abdominal pain, melena, hematochezia, severe indigestion/heartburn, hematuria, incontinence, muscle weakness, suspicious skin lesions, transient blindness, difficulty walking, depression, unusual weight change, abnormal bleeding, enlarged lymph nodes, and angioedema.     Objective:   Physical Exam    WD, WN, 78 y/o WM in NAD... GENERAL:  Alert & oriented; pleasant & cooperative; pale complexion... HEENT:  [place]/AT, EOM-wnl, PERRLA, EACs-clear, TMs-wnl, NOSE-clear, THROAT-clear & wnl. NECK:  Supple w/ fairROM; no JVD; normal carotid impulses w/o bruits; no thyromegaly or nodules palpated; no lymphadenopathy. CHEST:  decr BS bilat, scat bibasilar rales, w/o wheezing/ rhonchi/ signs of consolidation. HEART:  irregular rhythm, gr1/6 diast murmur in Ao area, without rubs or gallops detected... ABDOMEN:  Soft & nontender; normal bowel sounds; no organomegaly or masses palpated... EXT: s/p bilat TKR's, mod arthritic changes; no varicose veins/ +venous insuffic/ no  edema. NEURO:  CN's intact;  gait abn; no focal neuro deficits... DERM:  No lesions noted; no rash etc...  RADIOLOGY DATA:  Reviewed in the EPIC EMR & discussed w/ the patient...  LABORATORY DATA:  Reviewed in the EPIC EMR & discussed w/ the patient...   Assessment:      COPD, Hx Pneumonia>  Prev CXR/CT abn has resolved back to baseline severe COPD/Emphysema; then developed bilat lower lobe airsp dis/ atx & encouraged to cough & deep breathe==> now he sounds clear & we will f/u CXR on ret...  HBP>  Prev controlled on Aten50-1/2, Lasix20, Losartan100-1/2, K10; He called w/ weakness & hypotension- doses were 1st cut back, then meds held, & he is improved now on Lasix alone & we will continue to monitor...  CAD/ CHF/ etc>  Followed by Walker Kehr & his prev notes are reviewed;  BNP is stable; he has f/u appt soon for decision regarding restart Coumadin...  AFib>  On above + Coumadin followed in the CC but on hold now w/ hematuria & anemia; this will need to be restarted & rec f/u Cards...  Periph Vasc Dis>  S/p AAA repair 1998 by Windell Moulding; then followed by Ryan Hess for thoraco-abd aneurysm & referred to [facility] for stent graft surg- done 4/13 by Ryan Hess w/ complications as noted...  Ven Insuffic & EDEMA>  He has some LE pitting & swelling L>R hand/ arm; VenDopplers 8/13 left arm w/o subclavian DVT- rec elevation, no salt, continue Lasix40/d...  CHOL>  FLP looks good on Lip10...  GI> Presbyesoph, Divertics, Hx polpyps>  Stable & followed by Ryan Hess... 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  Prostate Cancer>  Followed by Ryan Hess  w/ slowly rising PSA as noted; then developed urinary retention after lumbar surg and hematuria after foley & coumadin; now urine has cleared off coumadin & he has f/u appt w/ Ryan Hess in 3d to decide on further course...  DJD>  Followed by DrOlin, s/p bilat  TKRs...  ANEMIA>  He developed acute blood loss anemia w/ post op Hg= 11-12 but dropped to 8.6 w/ hematuria & INR=4.2; Coumadin & ASA placed on HOLD; f/u labs showed Hg=8.4 & Fe=31 & started on FeSO4 Bid;  Then Hg=9.5 & rec to continue Fe Bid;  Cards restarted his Coumadin;  Now Hg= 13.2 & Fe improved,  ok top wean off Fe...  Actinic skin changes>  Aware, he is monitored by Derm...     Plan:     Patient's Medications  New Prescriptions   No medications on file  Previous Medications   ADVAIR DISKUS 250-50 MCG/DOSE AEPB    Inhale 1 puff into the lungs Twice daily.   ASPIRIN 81 MG TABLET    Take 81 mg by mouth daily.    ATENOLOL (TENORMIN) 25 MG TABLET    take 1 tablet by mouth once daily   ATORVASTATIN (LIPITOR) 10 MG TABLET    Take 1 tablet (10 mg total) by mouth daily.   FERROUS SULFATE 325 (65 FE) MG TABLET    Take 325 mg by mouth 2 (two) times daily.   FUROSEMIDE (LASIX) 40 MG TABLET    Take 40 mg by mouth daily.    GUAIFENESIN (MUCINEX) 600 MG 12 HR TABLET    Take 600 mg by mouth 2 (two) times daily. COPD.   MULTIPLE VITAMIN (MULTIVITAMIN) TABLET    Take 1 tablet by mouth daily.     OXYCODONE (OXY IR/ROXICODONE) 5 MG IMMEDIATE RELEASE TABLET       POTASSIUM CHLORIDE (K-DUR) 10 MEQ TABLET    Take 1 tablet (10 mEq total) by mouth 2 (two) times daily.   TAMSULOSIN HCL (FLOMAX) 0.4 MG CAPS    Take 0.4 mg by mouth at bedtime.    TIOTROPIUM (SPIRIVA HANDIHALER) 18 MCG INHALATION CAPSULE    Place 1 capsule (18 mcg total) into inhaler and inhale daily.   WARFARIN (COUMADIN) 2.5 MG TABLET    Take 2.5 mg by mouth as directed.   ZYVOX 2 MG/ML IVPB      Modified Medications   No medications on file  Discontinued Medications   No medications on file

## 2012-07-08 NOTE — Patient Instructions (Addendum)
Today we updated your med list in our EPIC system...    Continue your current medications the same...  Today we gave you the 2013 Flu vaccine...  We also did your follow up blood work & we will call you w/ the results...  Call for any problems...  Let's plan a follow up visit in 3 months.Marland KitchenMarland Kitchen

## 2012-07-09 LAB — IBC PANEL
Iron: 70 ug/dL (ref 42–165)
Saturation Ratios: 21.5 % (ref 20.0–50.0)
Transferrin: 232.1 mg/dL (ref 212.0–360.0)

## 2012-07-25 ENCOUNTER — Ambulatory Visit (INDEPENDENT_AMBULATORY_CARE_PROVIDER_SITE_OTHER): Payer: Medicare Other | Admitting: *Deleted

## 2012-07-25 DIAGNOSIS — I4891 Unspecified atrial fibrillation: Secondary | ICD-10-CM

## 2012-07-25 LAB — POCT INR: INR: 2.2

## 2012-08-20 ENCOUNTER — Ambulatory Visit (INDEPENDENT_AMBULATORY_CARE_PROVIDER_SITE_OTHER): Payer: Medicare Other | Admitting: *Deleted

## 2012-08-20 DIAGNOSIS — I4891 Unspecified atrial fibrillation: Secondary | ICD-10-CM

## 2012-08-20 LAB — POCT INR: INR: 2.6

## 2012-08-29 ENCOUNTER — Encounter: Payer: Self-pay | Admitting: Cardiovascular Disease

## 2012-08-29 ENCOUNTER — Telehealth: Payer: Self-pay | Admitting: Cardiovascular Disease

## 2012-08-29 ENCOUNTER — Ambulatory Visit (INDEPENDENT_AMBULATORY_CARE_PROVIDER_SITE_OTHER): Payer: Medicare Other | Admitting: Cardiovascular Disease

## 2012-08-29 VITALS — BP 130/70 | HR 70 | Wt 177.0 lb

## 2012-08-29 DIAGNOSIS — I359 Nonrheumatic aortic valve disorder, unspecified: Secondary | ICD-10-CM

## 2012-08-29 DIAGNOSIS — I4891 Unspecified atrial fibrillation: Secondary | ICD-10-CM

## 2012-08-29 DIAGNOSIS — I712 Thoracic aortic aneurysm, without rupture, unspecified: Secondary | ICD-10-CM

## 2012-08-29 DIAGNOSIS — IMO0001 Reserved for inherently not codable concepts without codable children: Secondary | ICD-10-CM

## 2012-08-29 DIAGNOSIS — I10 Essential (primary) hypertension: Secondary | ICD-10-CM

## 2012-08-29 DIAGNOSIS — I351 Nonrheumatic aortic (valve) insufficiency: Secondary | ICD-10-CM

## 2012-08-29 DIAGNOSIS — E7800 Pure hypercholesterolemia, unspecified: Secondary | ICD-10-CM

## 2012-08-29 NOTE — Progress Notes (Signed)
Patient ID: Ryan Hess, male   DOB: 04-28-1924, 78 y.o.   MRN: [id number] Ryan Hess returns today for followup. He had a horendous 6 monthsBut is doing better now. He has a history of aortic aneurysm repair by Dr. Tyrone Sage. 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 He has hypertension. The last time I saw him had a bit of a cough and we stopped his lisinopril and switched him to Atacand. His cough seems to be improved. He is not having any chest pain, PND, or orthopnea. He is active. He walks on a regular basis. He tends towards bradycardia and has had occasional PVCs. Chronic afib on coumadin with no bleeding problems He had a Myoview study done March 24, 2008, which was nonischemic. EF was not calculated due to PVCs. His EF by echo on March 24, 2008, was 45-50%. We had him on low-dose atenolol and this seems to have settled him down.  After stent he subsequently had ischemic bowel which required emergent surgery with secondary closure.  Back on coumadin Reviewed INR;s and they have been good at 2.2-2.6 With no bleeding issues  ROS: Denies fever, malais, weight loss, blurry vision, decreased visual acuity, cough, sputum, SOB, hemoptysis, pleuritic pain, palpitaitons, heartburn, abdominal pain, melena, lower extremity edema, claudication, or rash.  All other systems reviewed and negative  General: Affect appropriate Elderly male HEENT: normal Neck supple with no adenopathy JVP normal no bruits no thyromegaly Lungs clear with no wheezing and good diaphragmatic motion Heart:  S1/S2 systolic  Murmur and AR murmur, no rub, gallop or click PMI normal Abdomen: benighn, BS positve, no tenderness, no AAA no bruit.  No HSM or HJR Distal pulses intact with no bruits No edema Neuro non-focal Skin warm and dry echymosis on hands No muscular weakness   Current Outpatient  Prescriptions  Medication Sig Dispense Refill  . ADVAIR DISKUS 250-50 MCG/DOSE AEPB Inhale 1 puff into the lungs Twice daily.      [name] aspirin 81 MG tablet Take 81 mg by mouth daily.       [name] atenolol (TENORMIN) 25 MG tablet Take 25 mg by mouth daily.       [name] atorvastatin (LIPITOR) 10 MG tablet Take 1 tablet (10 mg total) by mouth daily.  90 tablet  3  . ferrous sulfate 325 (65 FE) MG tablet Take 325 mg by mouth 2 (two) times daily.      . furosemide (LASIX) 40 MG tablet Take 40 mg by mouth daily.       [name] guaiFENesin (MUCINEX) 600 MG 12 hr tablet Take 600 mg by mouth 2 (two) times daily. COPD.      . Multiple Vitamin (MULTIVITAMIN) tablet Take 1 tablet by mouth daily.        [name] oxyCODONE (OXY IR/ROXICODONE) 5 MG immediate release tablet As needed for pain      . potassium chloride (K-DUR) 10 MEQ tablet Take 1 tablet (10 mEq total) by mouth 2 (two) times daily.  180 tablet  3  . Tamsulosin HCl (FLOMAX) 0.4 MG CAPS Take 0.4 mg by mouth at bedtime.       [name] tiotropium (SPIRIVA HANDIHALER) 18 MCG inhalation capsule Place 1 capsule (18 mcg total) into inhaler and inhale daily.  90 capsule  3  . warfarin (COUMADIN) 2.5 MG tablet Take 2.5 mg by mouth as directed.      . [  DISCONTINUED] pantoprazole (PROTONIX) 40 MG tablet Take 1 tablet (40 mg total) by mouth daily.  30 tablet  5    Allergies  Review of patient's allergies indicates no known allergies.  Electrocardiogram:  7/29  Afib rate 105 PVC    Assessment and Plan

## 2012-08-29 NOTE — Assessment & Plan Note (Signed)
Good rate control and anticoagulation Run INR 2.0-2.5  No bleeding issue  q 6 week INRs

## 2012-08-29 NOTE — Assessment & Plan Note (Signed)
Well controlled.  Continue current medications and low sodium Dash type diet.    

## 2012-08-29 NOTE — Assessment & Plan Note (Signed)
No change in murmur moderate by echo 4/13  EF 45%  Stable Not a surgical candidate BP and afterload well controlled

## 2012-08-29 NOTE — Assessment & Plan Note (Signed)
Patient will F/U with Dr Tyrone Sage.  Encouraged him to have [facility] seem him and do F/U CT abdomen for stent.  He really does not want to go back to [facility]

## 2012-08-29 NOTE — Assessment & Plan Note (Signed)
Cholesterol is at goal.  Continue current dose of statin and diet Rx.  No myalgias or side effects.  F/U  LFT's in 6 months. Lab Results  Component Value Date   LDLCALC 60 08/30/2011

## 2012-08-29 NOTE — Patient Instructions (Signed)
Your physician wants you to follow-up in:  6 MONTHS WITH DR NISHAN  You will receive a reminder letter in the mail two months in advance. If you don't receive a letter, please call our office to schedule the follow-up appointment. Your physician recommends that you continue on your current medications as directed. Please refer to the Current Medication list given to you today. 

## 2012-08-29 NOTE — Telephone Encounter (Signed)
New Problem:    Patient's son called in because Dr. Dennie Maizes office told him his father would not be scheduled without a referral form Dr. [name].  Please call back.

## 2012-08-30 ENCOUNTER — Telehealth: Payer: Self-pay | Admitting: Pulmonary Disease

## 2012-08-30 MED ORDER — FLUTICASONE-SALMETEROL 250-50 MCG/DOSE IN AEPB: 1.0000 | INHALATION_SPRAY | Freq: Two times a day (BID) | RESPIRATORY_TRACT | Status: DC

## 2012-08-30 NOTE — Telephone Encounter (Signed)
Spoke w Mr. Cheatwood, patients son.  Requesting Advair Rx sent into [facility] ONLY at this time.  Rx has beens ent, son is aware and nothing further needed at this time.

## 2012-09-02 NOTE — Telephone Encounter (Signed)
PER PT WOULD LIKE  NURSE TO TALK TO SON   INFORMED PT  TO HAVE HIM TRY CALLING  AROUND 12:30 PM TOMORROW  ./CY

## 2012-09-12 ENCOUNTER — Other Ambulatory Visit: Payer: Self-pay | Admitting: Cardiovascular Disease

## 2012-09-12 MED ORDER — WARFARIN SODIUM 2.5 MG PO TABS: 2.5000 mg | ORAL_TABLET | ORAL | Status: DC

## 2012-09-16 ENCOUNTER — Other Ambulatory Visit: Payer: Self-pay

## 2012-09-16 MED ORDER — WARFARIN SODIUM 2.5 MG PO TABS: 2.5000 mg | ORAL_TABLET | ORAL | Status: DC

## 2012-09-16 NOTE — Telephone Encounter (Signed)
REFERRAL ENTERED   .Ryan Hess

## 2012-09-21 ENCOUNTER — Other Ambulatory Visit: Payer: Self-pay | Admitting: Pulmonary Disease

## 2012-10-01 ENCOUNTER — Ambulatory Visit (INDEPENDENT_AMBULATORY_CARE_PROVIDER_SITE_OTHER): Payer: Medicare Other | Admitting: *Deleted

## 2012-10-01 DIAGNOSIS — I4891 Unspecified atrial fibrillation: Secondary | ICD-10-CM

## 2012-10-01 LAB — POCT INR: INR: 3.8

## 2012-10-04 ENCOUNTER — Other Ambulatory Visit: Payer: Self-pay | Admitting: *Deleted

## 2012-10-04 MED ORDER — FUROSEMIDE 40 MG PO TABS: 40.0000 mg | ORAL_TABLET | Freq: Every day | ORAL | Status: DC

## 2012-10-07 ENCOUNTER — Ambulatory Visit (INDEPENDENT_AMBULATORY_CARE_PROVIDER_SITE_OTHER): Payer: Medicare Other | Admitting: Pulmonary Disease

## 2012-10-07 ENCOUNTER — Encounter: Payer: Self-pay | Admitting: Pulmonary Disease

## 2012-10-07 ENCOUNTER — Ambulatory Visit (INDEPENDENT_AMBULATORY_CARE_PROVIDER_SITE_OTHER)
Admission: RE | Admit: 2012-10-07 | Discharge: 2012-10-07 | Disposition: A | Discharge status: Home / Self Care | Payer: Medicare Other | Source: Ambulatory Visit | Attending: Pulmonary Disease | Admitting: Pulmonary Disease

## 2012-10-07 VITALS — BP 124/78 | HR 92 | Temp 97.2°F | Ht 69.0 in | Wt 184.3 lb

## 2012-10-07 DIAGNOSIS — I712 Thoracic aortic aneurysm, without rupture, unspecified: Secondary | ICD-10-CM

## 2012-10-07 DIAGNOSIS — K5730 Diverticulosis of large intestine without perforation or abscess without bleeding: Secondary | ICD-10-CM

## 2012-10-07 DIAGNOSIS — J449 Chronic obstructive pulmonary disease, unspecified: Secondary | ICD-10-CM

## 2012-10-07 DIAGNOSIS — C61 Malignant neoplasm of prostate: Secondary | ICD-10-CM

## 2012-10-07 DIAGNOSIS — J4489 Other specified chronic obstructive pulmonary disease: Secondary | ICD-10-CM

## 2012-10-07 DIAGNOSIS — I10 Essential (primary) hypertension: Secondary | ICD-10-CM

## 2012-10-07 DIAGNOSIS — I509 Heart failure, unspecified: Secondary | ICD-10-CM

## 2012-10-07 DIAGNOSIS — I4891 Unspecified atrial fibrillation: Secondary | ICD-10-CM

## 2012-10-07 DIAGNOSIS — D126 Benign neoplasm of colon, unspecified: Secondary | ICD-10-CM

## 2012-10-07 DIAGNOSIS — I739 Peripheral vascular disease, unspecified: Secondary | ICD-10-CM

## 2012-10-07 DIAGNOSIS — M1990 Unspecified osteoarthritis, unspecified site: Secondary | ICD-10-CM

## 2012-10-07 DIAGNOSIS — E7800 Pure hypercholesterolemia, unspecified: Secondary | ICD-10-CM

## 2012-10-07 NOTE — Patient Instructions (Addendum)
Today we updated your med list in our EPIC system...    Continue your current medications the same...  Today we did your follow up CXR-    We will call you w/ the results tomorrow...  Call for any problems or if we can be of service in any way...  Let's plan a follow up visit in about 4 months w/ FASTING blood work at that time.Marland KitchenMarland Kitchen

## 2012-10-07 NOTE — Progress Notes (Signed)
Subjective:     Patient ID: Ryan Hess, male   DOB: Jan 26, 1924, 79 y.o.   MRN: [id number]  HPI 79 y/o WM here for a follow up visit... he has multiple medical problems as noted below...    ~  March 22, 2012:  24mo ROV & [name] has been through a lot >> In Feb2013 he was sent for Endovasc consultation w/ DrMFarber at [facility] for his ~6cm Thoracoabdominal aneurysm- starting 5cm distal to the left subclavian down to the prox aspect of there prev [name] graft;  They chose to proceed w/ a fenestrated endovasc repair> done 01/01/12;  Disch on Coumadin for his AFib & Lovenox bridge but he developed a complic w/ lumbar epidural hematomas w/ paraplegia requiring readmission for laminectomy & evac of the hematomas;  He was sent to [facility] for rehab 4/18 - 01/30/12 and then disch to SN[facility] but only spent 4d there before he had to be readmit to [facility] 5/11 - 02/09/12 by [facility] w/ altered mental status, pneumonia, AFib w/ rvr, & FTT> Neuro w/u was otherw neg (& he improved w/ supportive care); CXR showed L>R bibasilar airsp dis (covered w/ IV antibiotics & disch on Avelox); AFib was rate controlled w/ med adjustments & Coumadin continued; Urinary retension required foley cath & Urology outpt management; his protimes got too thin & he developed hematuria & acute blood loss anemia; now BP has been low necessitating a HOLD on his BP med...  Thoracoabd aneurysm treated w/ fenestrated stent graft repair 4/13 by DrFarber [facility] >  Lumbar epidural hematomas w/ paraparesis requiring laminectomy 4/13 & evac of hematomas at [facility] (we don't have these records) > ambulating w/ walker & gaining strength...  Bilat lower lobe pneumonias treated in-pt 5/13 and disch on Avelox > He is afeb & starting to feel better, actually looks pretty good (just pale); CXR today showed resolution of infiltrates...  AFib w/ RVR> meds adjusted and Coumadin carefully monitored; Heart rate= 62 off Atenolol; Protime=13.6/1.2 Off Coumadin x  several days now; decision will need to be made regarding restart coumadin when safe...  Urinary retension from the lumbar surg and hematuria developing while on coumadin > he has foley cath & leg bag w/ clear yellow urine at present; appt to see DrWrenn in 3d to consider further options (?voiding trial, in&out cath, etc)...  Hypotension w/ weakness & required down titration in BP meds & careful monitoring of BP etc > currently on Lasix40, K10Bid; BP= 118/66 and has varied 90's/70's to 120's/80's at home OFF his prev Aten25 & Losartan50;  Chem's are WNL & BNP=552...  Anemia > He developed acute blood loss anemia w/ post op Hg= 11-12 but dropped to 8.6 w/ hematuria & INR=4.2; Coumadin & ASA placed on HOLD; f/u labs showed Hg=8.4, Fe=31 (11%sat);  Rec to start FeSO4 Bid now...  Complaints of constipation > we reviewed the need for MIRALAX 17gm in water daily, and SENAKOT-S 2tabs at bedtime... We reviewed prob list, meds, xrays and labs> CXR 6/13 showed stable cardiac silhouette, sl incr markings chronically, NAD, no edema, Ao stent in desc Ao appears stable... LABS 6/13:  Chems- ok w/ BUN=22 Creat=1.4 BNP=552;  CBC- Hg=8.4 Fe=31(11%sat) B12=953;  Protime=13.6/ INR1.2;  TSH=6.66  ~  May 03, 2012:  6wk ROV & post hosp follow up> Hiawatha presented to the ER 7/23 - 04/25/12 w/ abd pain- ELap surg revealed ~5 feet of frankly ischemic small bowel, likely due to internal hernia and closed loop obstruction=> Bowel resected &  pulses intact w/ no evidence of embolic or thrombotic pathology;  Viable small bowel anastomosed & wound vac applied; he had a stable post op course=> foley left in place;  Disch 8/1 w/ home health etc...    Since disch we had call from son regarding pts Cardiac meds and BP> when last seen his BP was low & he was weak- Aten & Losartan placed on hold, continued on Lasix40, he's been off his Coumadin since 6/13 w/ hematuria/ anemia=> hasn't yet been restarted & only covered transiently in hosp  w/ Lovenox, disch on ASA81, no coumadin;  BP since disch was still low (95/70) so Aten, Losartan kept on [place] continued...     He is pale & weak but gaining strength he says;  He saw Urology 8/8 w/ adeq voiding trial & Foley removed, voiding satis so far;  Since disch he's noted some pitting edema in LEs and L>R arm swelling (he had left IJ line in hosp)=> we decided to check VenDopplers ASAP & prelim report indicates no subclavian clots;  Needs Cards follow up & decision regarding restart of Coumadin w/ careful protime monitoring...  We reviewed prob list, meds, xrays and labs >> LABS 7/13 in hosp near disch> Hg=8.2, MCV=93, BS=ok, K=3.3, Creat=0.85, Alb=3.2, LFTs=wnl... CXR 7/13 showed cardiomeg, intubated, low lung vols & bilat LL airsp dis L>R... 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  ~  May 23, 2012:  3wk ROV & Kendal's swelling is diminished & his weight is down 13# to 173# today on Lasix40 & K10Bid;  BP= 112/70 & his strength is sl better w/ Losartan50 on HOLD & son is giving him Aten25 only if BP>110 sys... NOTE> he has restarted the ASA'81mg'$ /d but his COUMADIN is still on hold & pending Cardiology appt next week (Off Coumadin since 6/13 due to hematuria, then abd pain/ ELap, etc; he has had several coumadin complications)...    We reviewed prob list, meds, xrays and labs> see below for updates >>  ~  July 08, 2012:  6wk ROV & [name] is stable, slowly gaining strength, wt up 3# to 176# today, BP well regulated on his Lasix40 & takes the Aten25 only if BP>110;  Breathing stable on Advair250 + Spiriva & he uses the Mucinex as needed;  Due for f/u CBC & Fe on his FeSO4 '325mg'$ Bid...    We reviewed prob list, meds, xrays and labs> see below for updates >> OK Flu vaccine today... LABS 10/13:  Chems- wnl;  CBC- improved w/ Hg=13.2 & Fe looks good etc...  ~  October 07, 2012:  27moROV & [name] much better, stable, just c/o some back discomfort;  We reviewed the following medical problems during today's office visit >>     COPD, HxPneumonia> on Advair250, Spiriva, Mucinex; last CT 12/12 w/ severe emphysema & scarring, plus his extensive atherosclerotic dis/ ectatic thorAo/ aneurysm/ etc; his breathing is stable- mild cough, min sput, no hemoptysis, stable DOE, etc...     HBP, CHF, AFib w/ hx clot in LA appendage- resolved on Coumadin> on ASA81, Aten25, Lasix40, K10Bid; followed by DBurkina Fasofor Cards- seen 12/13 & his note is reviewed, no changes made...    Peripheral Vasc Dis> on ASA81 & Coumadin; [facility]  Chol> on Lip10; last FLP 12/12 showed TChol 107, TG 38, HDL 40, LDL 60    GI- Divertics, Polyps, Hx ischemic bowel> the latter was due to an internal hernia & closed loop obstruction & not from any thrombotic or embolic phenomenon; he has done well since the surgery...    GU- prostate cancer> followed by DrWrenn & he elected XRT- admin by DrKinard in 2009; he has a slowly rising PSa w/ a doubling time of 6-28mo; he is considerinjg androgen ablation if necessary...    DJD> on Oxy-IR 5mg  prn; he is s/p bilat TKRs by DrOlin    Hx Anemia> on Fe &VI- Hg increased from 9.5 post op to 13.2 w/ Fe=70 (21% sat) in Oct2013... We reviewed prob list, meds, xrays and labs> see below for updates >> he had the 2013 Flu vaccine 10/13...  CXR 1/14 showed cardiomegaly, aortic stent stable, clear lungs w/ basilar scarring, NAD...           Problem List:  COPD (ICD-496) Hx of PNEUMONIA, ORGANISM UNSPECIFIED (ICD-486) PULMONARY NODULE & CAVITY RLL (ICD-518.89) - he is an ex-smoker, having quit in 1990 after 40 yrs of smoking... he was exercising regularly and walking daily ~53mi 5-6 days per week... on ADVAIR 250Bid & SPIRIVA daily, +MUCINEX 2Bid w/ Fluids... ~  baseline CXR & CTChest w/ marked  emphysema, atheromatous calcif, 5cm desc thorAA...  ~  CXR & CTA 2/11 in hosp showed cardiomeg + coronary calcif, advanced atherosclerosis of Ao w/ aneuryms 5.8cm into abd, underlying emphysema w/ interst edema & bilat effusions, no PE... ~  4/11:  RLL pneumonia which was slow to clear... ~  6/11:  f/u CXR & CT Chest 6/11 w/ 6cm cavitary area RLL & nodular component inferiorly, +hilar adenopathy, no mediastinal nodes, etc; Tumor markers showed CEA=7.5 & Ca19-9= 10.8; > IR felt lesion was too hi risk for bx, therefore contin Rx & observe. ~  8/11:  f/u CXR w/o change in RLL opac (no worsening)>> repeat CEA=6.5; plan f/u CT Chest in 65mo. ~  10/11: f/u CT Chest showed interval decrease in size of RLL cavitary lesion & the inferiorly placed soft tissue component; otherw there is severe emphysema, cardiomeg, coronary calcif, thoracoabd aneurysm w/o change; there was an air-fluid level in a dilated esoph> check Ba Esophagram= Nonspecific esophageal motility disorder with very poor primary esophageal contractions (no HH, stricture, etc)... ~  12/12: f/u CTChest showed severe emphysema & scarring w/ bibasilar atx (no infiltrates no cavities), markedly tortuous & ectaticThorAo w/ extensive atherosclerotic changes & no change in decr thor aneurysm measuring 5.9 x 5.6cm, mild cardiomeg & dense coronary calcif as well... ~  4/13:  He had a fenestrated endovasc repair of Thoraco-abdominal AA by DrFarber at [facility]... ~  subseq CXRs thru 7/13 hosp w/ cardiomeg, low lung vols, bibasilar airsp dis, etc...   HYPERTENSION (ICD-401.9)                         << followed by Walker Kehr for Cards >> CONGESTIVE HEART FAILURE (ICD-428.0) ATRIAL FIBRILLATION (ICD-427.31) w/ clot in left atrial appendage >> resolved on f/u TEE, on COUMADIN per Cards & cardioverted. PREMATURE VENTRICULAR CONTRACTIONS, FREQUENT (ICD-427.69)  MEDS> prev on ATENOLOL 25mg /d (on hold- uses prn), & LASIX 20mg -2AM + KCl , and off Losartan now...  prev Norvasc discontinued & hx ACE cough in the past on Lisinopril. ~  NuclearStressTest 1/05 was neg- no ischemia or infarct (+diaphrag attenuation), EF=56%... ~  repeat Nuclear  study 6/09 was neg- no ischemia, mild inferoapic thinning, not gated due to PVCs... ~  2DEcho 6/09 showed mild dilated LV w/ EF= 45-50% but no regional wall motion abn, mild AoV calcif & AI, LA mild dil... ~  12/10:  changed from [place] to LOSARTAN to save $$ ~  2/11:  in hosp- Cath= mild nonobstructive 3 vessel CAD, mod LVD w/ EF=40-45%; TEE= CHF w/ EF~45% w/ HK & left atrial appendage clot & AFib; he was diuresed & Norvasc stopped, placed on Coumadin w/ careful f/u by Walker Kehr & the CC... ~  Subseq successful [facility] & holding NSR w/ PVCs... ~  Recurrent AFib w/ RVR after his Thoracoabdominal stent graft 4/13... ~  6/13:  BP was low assoc w/ GU bleeding & ATENOLOL25 & LOSARTAN50 placed on HOLD... ~  COUMADIN on HOLD since GU bleeding & acute blood loss anemia; it has not yet been restarted... ~  8/13:  BP= 116/78 on LASIX 40mg /d & K10-Bid;  Wt down 14# to 173# ~  10/13:  BP= 114/82 on Lasix40+K1-Bid (he takes Aten25 if BP>110 at home)... ~  12/13: he had Cards f/u DrNishan> note reviewed, no change in meds, he referred him to DrGearhart for f/u stent as he did not want to ret to [facility]...  PERIPHERAL VASCULAR DISEASE (ICD-443.9) - back on ASA 81mg /d... he is s/p infrarenal AAA repair w/ right common iliac aneurysm repair (via Ao Bi-iliac graft) in 1998 by DrLawson... also has a known desc thor AA measuring ~5+cm and followed by DrGearhardt periodically... ~  seen by DrGearhart 4/10 w/ CT scan showing ~5cm thoracoabdominal aneurysm w/o signif change...  ~  seen by DrGearhart 2/11 in hosp w/ sl incr size of AAA- f/u planned 76mo. ~  also followed by [facility]- DrCooper. ~  seen by DrGearhardt 10/11 & stable, no change, f/u 1 yr. ~  Seen 12/12 by DrGearhardt & CTscan is stable, continue conservative approach... ~  2/13:  He has eval in  the Endovasc clinic at [facility] by DrFarber... ~  4/13:  He had a fenestrated endovasc repair of Thoraco-abdominal AA by DrFarber at [facility], complic by lumbar epidural hematomas (on Coumadin) w/ paraplegia requiring readmission for laminectomy & evac of the hematomas;  He was sent to [facility] for rehab 4/18 - 01/30/12 and then disch to SNF [facility]) but only spent 4d there before he had to be readmit to [facility] 5/11 - 02/09/12 by [facility] w/ altered mental status, pneumonia, AFib w/ rvr, & FTT> Neuro w/u was otherw neg (& he improved w/ supportive care)...  HYPERCHOLESTEROLEMIA (ICD-272.0) - on LIPITOR 10mg /d...  ~  FLP 5/07 showed TChol 115, Tg 58, HDL 36, LDL 67 ~  FLP 5/08 showed TChol 118, TG 66, HDL 31, LDL 74 ~  FLP 5/09 showed TChol 126, TG 71, HDL 37, LDL 75 ~  FLP 6/10 showed TChol 116, TG 73, HDL 40, LDL 62 ~  2/11:  FLP not checked during the hospitalization... ~  FLP 4/11 in hosp showed TChol 94, TG 52, HDL 23, LDL 61 ~  FLP 12/12 on Lip10 showed TChol 107, TG 38, HDL 40, LDL 60  DIVERTICULOSIS OF COLON (ICD-562.10) & COLONIC POLYPS (ICD-211.3) - hx polyps in 2003 = tubular adenoma... ~  last colonoscopy 9/06 showed divertics, hems, no polyps.  ISCHEMIC BOWEL >> Tawfiq presented to the ER 7/23 - 04/25/12 w/ abd pain- ELap surg revealed ~5 feet of frankly ischemic small bowel, likely due to internal hernia and closed loop obstruction=> Bowel  resected & pulses intact w/ no evidence of embolic or thrombotic pathology;  Viable small bowel anastomosed & wound vac applied; he had a stable post op course...  PROSTATE CANCER (ICD-185) - eval by DrWrenn and they decided on XRT by DrKinard- finished 5/09 and he states that DrKinard "released me"... he sees DrWrenn every 6 months & they follow PSA closely (notes reviewed)... ~  8/12: f/u prostate cancer dx in 2009 & treated w/ XRT completed 6/09; slowly rising PSA w/ doubling time 6-66mo; min voiding symptoms, he is considering androgen ablation if  needed... ~  6/13: He developed hematuria & urinary retention requiring Urology eval, discontinuation of Coumadin, & Foley placement=> subseq removed 8/13 w/ adeq voiding trial...  DEGENERATIVE JOINT DISEASE (ICD-715.90) - s/p bilat TKR's, [facility]- DrOlin... He has OXY-IR 5mg  as needed for pain...  ACTINIC SKIN DAMAGE (ICD-692.70) - followed by Elnora Morrison, he knows to avoid sun exposure, use sun screen, etc...  ANEMIA >> ~  Labs 6/13 showed Hg= 8.4, Fe= 31 (11%sat); Rec to start FeSO4 Bid... ~  Labs 7/13 in hosp showed Hg= 12.6=>8.2 at disch... ~  Labs 8/13 showed Hg= 9.5, MCV= 94; rec to continue FeSO4 Bid... ~  Labs 10/13 showed Hg= 13.2 & ok to wean Fe to 1/d til gone then stop...   Past Surgical History  Procedure Date  . Total knee arthroplasty     bilateral  . Abdominal aortic aneurysm repair 1998  . Appendectomy   . Inguinal hernia repair     left  . Joint replacement   . Laparotomy 04/16/2012    Procedure: EXPLORATORY LAPAROTOMY;  Surgeon: Lodema Pilot, DO;  Location: [facility] OR;  Service: General;  Laterality: N/A;  exploratory Laparotomy,Small bowel ressection abdominal wound vac placement.  . Laparotomy 04/18/2012    Procedure: EXPLORATORY LAPAROTOMY;  Surgeon: Clovis Pu. Cornett, MD;  Location: [facility] OR;  Service: General;  Laterality: N/A;  exploratory laparotomy with small bowel anastomosis  . Application of wound vac 04/18/2012    Procedure: APPLICATION OF WOUND VAC;  Surgeon: Clovis Pu. Cornett, MD;  Location: [facility] OR;  Service: General;  Laterality: N/A;  Removal of abdominal wound vac, Application of incisional  wound vac    Outpatient Encounter Prescriptions as of 10/07/2012  Medication Sig Dispense Refill  . ADVAIR DISKUS 250-50 MCG/DOSE AEPB inhale 1 dose by mouth twice a day  60 each  6  . aspirin 81 MG tablet Take 81 mg by mouth daily.       [name] atenolol (TENORMIN) 25 MG tablet Take 25 mg by mouth daily.       [name] atorvastatin (LIPITOR) 10 MG tablet Take 1 tablet (10 mg  total) by mouth daily.  90 tablet  3  . ferrous sulfate 325 (65 FE) MG tablet Take 325 mg by mouth 2 (two) times daily.      . furosemide (LASIX) 40 MG tablet Take 1 tablet (40 mg total) by mouth daily.  30 tablet  5  . guaiFENesin (MUCINEX) 600 MG 12 hr tablet Take 600 mg by mouth 2 (two) times daily. COPD.      . Multiple Vitamin (MULTIVITAMIN) tablet Take 1 tablet by mouth daily.        . potassium chloride (K-DUR) 10 MEQ tablet Take 1 tablet (10 mEq total) by mouth 2 (two) times daily.  180 tablet  3  . Tamsulosin HCl (FLOMAX) 0.4 MG CAPS Take 0.4 mg by mouth at bedtime.       [name] tiotropium (  SPIRIVA HANDIHALER) 18 MCG inhalation capsule Place 1 capsule (18 mcg total) into inhaler and inhale daily.  90 capsule  3  . warfarin (COUMADIN) 2.5 MG tablet Take 1 tablet (2.5 mg total) by mouth as directed.  90 tablet  1  . warfarin (COUMADIN) 2.5 MG tablet USE AS DIRECTED BY ANTICOAGULATION CLINIC.  30 tablet  3  . oxyCODONE (OXY IR/ROXICODONE) 5 MG immediate release tablet As needed for pain        No Known Allergies   Current Medications, Allergies, Past Medical History, Past Surgical History, Family History, and Social History were reviewed in [employer] record.   Review of Systems        See HPI - all other systems neg except as noted...  The patient complains of dyspnea on exertion.  The patient denies anorexia, fever, weight loss, weight gain, vision loss, decreased hearing, hoarseness, chest pain, syncope, peripheral edema, prolonged cough, headaches, hemoptysis, abdominal pain, melena, hematochezia, severe indigestion/heartburn, hematuria, incontinence, muscle weakness, suspicious skin lesions, transient blindness, difficulty walking, depression, unusual weight change, abnormal bleeding, enlarged lymph nodes, and angioedema.     Objective:   Physical Exam    WD, WN, 79 y/o WM in NAD... GENERAL:  Alert & oriented; pleasant & cooperative; pale  complexion... HEENT:  [place]/AT, EOM-wnl, PERRLA, EACs-clear, TMs-wnl, NOSE-clear, THROAT-clear & wnl. NECK:  Supple w/ fairROM; no JVD; normal carotid impulses w/o bruits; no thyromegaly or nodules palpated; no lymphadenopathy. CHEST:  decr BS bilat, scat bibasilar rales, w/o wheezing/ rhonchi/ signs of consolidation. HEART:  irregular rhythm, gr1/6 diast murmur in Ao area, without rubs or gallops detected... ABDOMEN:  Soft & nontender; normal bowel sounds; no organomegaly or masses palpated... EXT: s/p bilat TKR's, mod arthritic changes; no varicose veins/ +venous insuffic/ no edema. NEURO:  CN's intact;  gait abn; no focal neuro deficits... DERM:  No lesions noted; no rash etc...  RADIOLOGY DATA:  Reviewed in the EPIC EMR & discussed w/ the patient...  LABORATORY DATA:  Reviewed in the EPIC EMR & discussed w/ the patient...   Assessment:      COPD, Hx Pneumonia>  Prev CXR/CT abn has resolved back to baseline severe COPD/Emphysema; then developed bilat lower lobe airsp dis/ atx & encouraged to cough & deep breathe==> now he sounds clear & we will f/u CXR on ret...  HBP>  Prev controlled on Aten50-1/2, Lasix20, Losartan100-1/2, K10; He called w/ weakness & hypotension- doses were 1st cut back, then meds held, & he is improved now on Lasix alone & we will continue to monitor...  CAD/ CHF/ etc>  Followed by Walker Kehr & his prev notes are reviewed;  BNP is stable; he has f/u appt soon for decision regarding restart Coumadin...  AFib>  On above + Coumadin followed in the CC but on hold now w/ hematuria & anemia; this will need to be restarted & rec f/u Cards...  Periph Vasc Dis>  S/p AAA repair 1998 by Windell Moulding; then followed by DrGearhardt for thoraco-abd aneurysm & referred to [facility] for stent graft surg- done 4/13 by DrFarber w/ complications as noted...  Ven Insuffic & EDEMA>  He has some LE pitting & swelling L>R hand/ arm; VenDopplers 8/13 left arm w/o subclavian DVT- rec elevation, no salt,  continue Lasix40/d...  CHOL>  FLP looks good on Lip10...  GI> Presbyesoph, Divertics, Hx polpyps>  Stable & followed by DrDBrodie... He was Hosp again 7/13 w/ adb pain, Elap showed ischemic bowel fron int hernia & closed loop  obstruction- required bowel resection & anastomosis...  Prostate Cancer>  Followed by DrWrenn  w/ slowly rising PSA as noted; then developed urinary retention after lumbar surg and hematuria after foley & coumadin; now urine has cleared off coumadin & he has f/u appt w/ DrWrenn in 3d to decide on further course...  DJD>  Followed by DrOlin, s/p bilat TKRs...  ANEMIA>  He developed acute blood loss anemia w/ post op Hg= 11-12 but dropped to 8.6 w/ hematuria & INR=4.2; Coumadin & ASA placed on HOLD; f/u labs showed Hg=8.4 & Fe=31 & started on FeSO4 Bid;  Then Hg=9.5 & rec to continue Fe Bid;  Cards restarted his Coumadin;  Now Hg= 13.2 & Fe improved, ok top wean off Fe...  Actinic skin changes>  Aware, he is monitored by Derm...     Plan:     Patient's Medications  New Prescriptions   No medications on file  Previous Medications   ADVAIR DISKUS 250-50 MCG/DOSE AEPB    inhale 1 dose by mouth twice a day   ASPIRIN 81 MG TABLET    Take 81 mg by mouth daily.    ATENOLOL (TENORMIN) 25 MG TABLET    Take 25 mg by mouth daily.    ATORVASTATIN (LIPITOR) 10 MG TABLET    Take 1 tablet (10 mg total) by mouth daily.   FERROUS SULFATE 325 (65 FE) MG TABLET    Take 325 mg by mouth 2 (two) times daily.   GUAIFENESIN (MUCINEX) 600 MG 12 HR TABLET    Take 600 mg by mouth 2 (two) times daily. COPD.   MULTIPLE VITAMIN (MULTIVITAMIN) TABLET    Take 1 tablet by mouth daily.     OXYCODONE (OXY IR/ROXICODONE) 5 MG IMMEDIATE RELEASE TABLET    As needed for pain   POTASSIUM CHLORIDE (K-DUR) 10 MEQ TABLET    Take 1 tablet (10 mEq total) by mouth 2 (two) times daily.   TAMSULOSIN HCL (FLOMAX) 0.4 MG CAPS    Take 0.4 mg by mouth at bedtime.    TIOTROPIUM (SPIRIVA HANDIHALER) 18 MCG INHALATION  CAPSULE    Place 1 capsule (18 mcg total) into inhaler and inhale daily.   WARFARIN (COUMADIN) 2.5 MG TABLET    Take 1 tablet (2.5 mg total) by mouth as directed.   WARFARIN (COUMADIN) 2.5 MG TABLET    USE AS DIRECTED BY ANTICOAGULATION CLINIC.  Modified Medications   Modified Medication Previous Medication   FUROSEMIDE (LASIX) 40 MG TABLET furosemide (LASIX) 40 MG tablet      Take 1 tablet (40 mg total) by mouth daily.    Take 1 tablet (40 mg total) by mouth daily.  Discontinued Medications   No medications on file

## 2012-10-10 ENCOUNTER — Telehealth: Payer: Self-pay | Admitting: Pulmonary Disease

## 2012-10-10 NOTE — Telephone Encounter (Signed)
Called and spoke with pt and he is aware of cxr results per SN..  Pt voiced his understanding and nothing further is needed. 

## 2012-10-18 ENCOUNTER — Ambulatory Visit (INDEPENDENT_AMBULATORY_CARE_PROVIDER_SITE_OTHER): Payer: Medicare Other

## 2012-10-18 DIAGNOSIS — I4891 Unspecified atrial fibrillation: Secondary | ICD-10-CM

## 2012-10-18 LAB — POCT INR: INR: 2.8

## 2012-10-25 ENCOUNTER — Other Ambulatory Visit: Payer: Self-pay | Admitting: *Deleted

## 2012-10-25 MED ORDER — FUROSEMIDE 40 MG PO TABS: 40.0000 mg | ORAL_TABLET | Freq: Every day | ORAL | Status: DC

## 2012-10-29 ENCOUNTER — Ambulatory Visit: Payer: Medicare Other | Admitting: Vascular Surgery

## 2012-11-01 ENCOUNTER — Encounter: Payer: Self-pay | Admitting: Surgery

## 2012-11-04 ENCOUNTER — Encounter: Payer: Medicare Other | Admitting: Surgery

## 2012-11-04 ENCOUNTER — Encounter: Payer: Self-pay | Admitting: Surgery

## 2012-11-04 ENCOUNTER — Ambulatory Visit (INDEPENDENT_AMBULATORY_CARE_PROVIDER_SITE_OTHER): Payer: Medicare Other | Admitting: Surgery

## 2012-11-04 VITALS — BP 141/97 | HR 64 | Ht 69.0 in | Wt 179.3 lb

## 2012-11-04 DIAGNOSIS — I714 Abdominal aortic aneurysm, without rupture, unspecified: Secondary | ICD-10-CM | POA: Insufficient documentation

## 2012-11-04 DIAGNOSIS — I712 Thoracic aortic aneurysm, without rupture, unspecified: Secondary | ICD-10-CM

## 2012-11-04 NOTE — Progress Notes (Signed)
[facility]   Patient name: Ryan Hess MRN: [id number] DOB: 1924-01-03 Sex: male     Chief Complaint  Patient presents with  . New Evaluation    eval AAA, JDL repaired AAA in 1998- desending thoracic aneurysm stented at [facility] [id number] had complications but does not want to return to [place]: The patient has a history of abdominal aortic aneurysm repair, and done open by Dr. Kellie Simmering in 1998. He had been followed for a thoracoabdominal aneurysm which was referred to [facility] for repair. I do not have the full details of the procedure. It sounds as if this was to be done as a staged operation however he developed spinal cord ischemia. It is unclear to me whether or not he has had the aneurysm completely fixed. Also he developed mesenteric ischemia and according to him and his son required resection of approximately 5 feet of intestine.  The patient has a history of atrial fibrillation. He is on anticoagulation. He is medically managed for his hypertension and hyperlipidemia. He is taking a statin   Past Medical History  Diagnosis Date  . COPD (chronic obstructive pulmonary disease)   . Pneumonia, organism unspecified   . Pulmonary nodule   . HTN (hypertension)   . CHF (congestive heart failure)   . Atrial fibrillation   . Drug therapy   . PVC (premature ventricular contraction)   . PVD (peripheral vascular disease)   . Thoracic aortic aneurysm   . Hypercholesteremia   . Diverticulosis of colon   . Colon polyps   . Prostate cancer   . DJD (degenerative joint disease)   . Actinic skin damage   . Hyperlipidemia   . Internal hemorrhoids     Past Surgical History  Procedure Laterality Date  . Total knee arthroplasty      bilateral  . Abdominal aortic aneurysm repair  1998  . Appendectomy    . Inguinal hernia repair      left  . Joint replacement    . Laparotomy  04/16/2012    Procedure: EXPLORATORY LAPAROTOMY;   Surgeon: Madilyn Hook, DO;  Location: [place];  Service: General;  Laterality: N/A;  exploratory Laparotomy,Small bowel ressection abdominal wound vac placement.  . Laparotomy  04/18/2012    Procedure: EXPLORATORY LAPAROTOMY;  Surgeon: Joyice Faster. Cornett, MD;  Location: [facility];  Service: General;  Laterality: N/A;  exploratory laparotomy with small bowel anastomosis  . Application of wound vac  04/18/2012    Procedure: APPLICATION OF WOUND VAC;  Surgeon: Joyice Faster. Cornett, MD;  Location: [place];  Service: General;  Laterality: N/A;  Removal of abdominal wound vac, Application of incisional  wound vac    History   Social History  . Marital Status: Married    Spouse Name: N/A    Number of Children: 3  . Years of Education: N/A   Occupational History  . retired Administrator, [employer]   . [employer] veteran    Social History Main Topics  . Smoking status: Former Smoker -- 2.00 packs/day for 44 years    Types: Cigarettes    Quit date: 05/15/1982  . Smokeless tobacco: Never Used  . Alcohol Use: No  . Drug Use: No  . Sexually Active: Not on file   Other Topics Concern  . Not on file   Social History Narrative  . No narrative on file    Family History  Problem Relation Age of Onset  .  Dementia Mother   . Stroke Father   . Diabetes Brother   . Heart disease Brother   . Heart disease Sister   . Heart disease Brother   . Obesity Brother   . Cancer Sister     back ??  . Alzheimer's disease Sister   . Colon cancer Neg Hx     Allergies as of 11/04/2012  . (No Known Allergies)    Current Outpatient Prescriptions on File Prior to Visit  Medication Sig Dispense Refill  . ADVAIR DISKUS 250-50 MCG/DOSE AEPB inhale 1 dose by mouth twice a day  60 each  6  . aspirin 81 MG tablet Take 81 mg by mouth daily.       [name] atenolol (TENORMIN) 25 MG tablet Take 25 mg by mouth daily.       [name] atorvastatin (LIPITOR) 10 MG tablet Take 1 tablet (10 mg total) by mouth daily.  90 tablet  3  . ferrous sulfate 325  (65 FE) MG tablet Take 325 mg by mouth 2 (two) times daily.      . furosemide (LASIX) 40 MG tablet Take 1 tablet (40 mg total) by mouth daily.  30 tablet  5  . guaiFENesin (MUCINEX) 600 MG 12 hr tablet Take 600 mg by mouth 2 (two) times daily. COPD.      . Multiple Vitamin (MULTIVITAMIN) tablet Take 1 tablet by mouth daily.        [name] oxyCODONE (OXY IR/ROXICODONE) 5 MG immediate release tablet As needed for pain      . potassium chloride (K-DUR) 10 MEQ tablet Take 1 tablet (10 mEq total) by mouth 2 (two) times daily.  180 tablet  3  . Tamsulosin HCl (FLOMAX) 0.4 MG CAPS Take 0.4 mg by mouth at bedtime.       [name] tiotropium (SPIRIVA HANDIHALER) 18 MCG inhalation capsule Place 1 capsule (18 mcg total) into inhaler and inhale daily.  90 capsule  3  . warfarin (COUMADIN) 2.5 MG tablet Take 1 tablet (2.5 mg total) by mouth as directed.  90 tablet  1  . warfarin (COUMADIN) 2.5 MG tablet USE AS DIRECTED BY ANTICOAGULATION CLINIC.  30 tablet  3  . [DISCONTINUED] pantoprazole (PROTONIX) 40 MG tablet Take 1 tablet (40 mg total) by mouth daily.  30 tablet  5   No current facility-administered medications on file prior to visit.     REVIEW OF SYSTEMS: Cardiovascular: No chest pain, chest pressure, palpitations, positive for lower extremity edema  Pulmonary: No productive cough, asthma or wheezing. Neurologic: No weakness, paresthesias, aphasia, or amaurosis. No dizziness. Hematologic: No bleeding problems or clotting disorders. Musculoskeletal: No joint pain or joint swelling. Bluish discoloration of both feet Gastrointestinal: No blood in stool or hematemesis Genitourinary: No dysuria or hematuria. Psychiatric:: No history of major depression. Integumentary: No rashes or ulcers. Constitutional: No fever or chills.  PHYSICAL EXAMINATION:   Vital signs are BP 141/97  Pulse 64  Ht '5\' 9"'$  (1.753 m)  Wt 179 lb 4.8 oz (81.33 kg)  BMI 26.47 kg/m2  SpO2 90% General: The patient appears their stated  age. HEENT:  No gross abnormalities Pulmonary:  Non labored breathing Abdomen: Soft and non-tender midline incision is well-healed without evidence of hernia Musculoskeletal: There are no major deformities. Neurologic: No focal weakness or paresthesias are detected, Skin: There are no ulcer or rashes noted. Psychiatric: The patient has normal affect. Cardiovascular: There is a regular rate and rhythm without significant murmur appreciated. No carotid bruits. I  cannot palpate pedal pulses palpable femoral pulses   Diagnostic Studies None  Assessment: Thoracoabdominal aneurysm Plan: At this point, I cannot make formal recommendations as I need more information. I will try to get hospital records from [facility] synovectomy better understand what procedure he has had done. It is also not clear to me whether or not his aneurysm has been completely fixed. I will also need to get a CT angiogram of the chest abdomen and pelvis to better define his anatomy. The patient will have these performed over the next 2-3 weeks and will followup with me for further recommendations.  Eldridge Abrahams, M.D. [facility] [place] Office: [phone] Pager:  [phone]

## 2012-11-05 NOTE — Addendum Note (Signed)
Addended by: Sharee Pimple on: 11/05/2012 09:59 AM   Modules accepted: Orders

## 2012-11-12 ENCOUNTER — Telehealth: Payer: Self-pay | Admitting: Pulmonary Disease

## 2012-11-12 NOTE — Telephone Encounter (Signed)
Forward 9 pages form [employer] to Dr. Alroy Dust for review on 11-12-12 ym

## 2012-11-15 ENCOUNTER — Ambulatory Visit (INDEPENDENT_AMBULATORY_CARE_PROVIDER_SITE_OTHER): Payer: Medicare Other | Admitting: *Deleted

## 2012-11-15 ENCOUNTER — Other Ambulatory Visit: Payer: Self-pay | Admitting: Surgery

## 2012-11-15 ENCOUNTER — Encounter: Payer: Self-pay | Admitting: Surgery

## 2012-11-15 DIAGNOSIS — I4891 Unspecified atrial fibrillation: Secondary | ICD-10-CM

## 2012-11-15 LAB — POCT INR: INR: 2.5

## 2012-11-15 LAB — BUN: BUN: 34 mg/dL — ABNORMAL HIGH (ref 6–23)

## 2012-11-15 LAB — CREATININE, SERUM: Creat: 1.46 mg/dL — ABNORMAL HIGH (ref 0.50–1.35)

## 2012-11-18 ENCOUNTER — Ambulatory Visit
Admission: RE | Admit: 2012-11-18 | Discharge: 2012-11-18 | Disposition: A | Discharge status: Home / Self Care | Payer: Medicare Other | Source: Ambulatory Visit | Attending: Surgery | Admitting: Surgery

## 2012-11-18 ENCOUNTER — Encounter: Payer: Self-pay | Admitting: Surgery

## 2012-11-18 ENCOUNTER — Ambulatory Visit (INDEPENDENT_AMBULATORY_CARE_PROVIDER_SITE_OTHER): Payer: Medicare Other | Admitting: Surgery

## 2012-11-18 VITALS — BP 135/78 | HR 75 | Ht 69.0 in | Wt 179.7 lb

## 2012-11-18 DIAGNOSIS — I712 Thoracic aortic aneurysm, without rupture, unspecified: Secondary | ICD-10-CM

## 2012-11-18 DIAGNOSIS — I714 Abdominal aortic aneurysm, without rupture, unspecified: Secondary | ICD-10-CM

## 2012-11-18 MED ORDER — IOHEXOL 350 MG/ML SOLN
100.0000 mL | Freq: Once | INTRAVENOUS | Status: AC | PRN
  Administered 2012-11-18: 100 mL via INTRAVENOUS

## 2012-11-18 NOTE — Progress Notes (Signed)
Back today for followup. He has a thoracoabdominal aneurysm. He is undergoing a staged repair at [facility]. He experienced some complications and has been reluctant to be reevaluated down there. My plan was to obtain his old records and repeat peak his CT angiogram to evaluate his previous repair and recommend further treatment. Unfortunately, I have not been able to get his hospital records today. I have reviewed his CT scan this reveals that a stent has been placed in his descending thoracic aorta. It terminates above the celiac artery within an aneurysmal segment. The patient has a infrarenal Eiffert Katie graft in place. Mesenteric vessels remain patent as do his renal arteries. He continues to have approximately a 6 cm thoracoabdominal aneurysm.  I spent approximately 30 minutes discussing the CT scan findings and his scenario with the patient and his son. He is not a candidate for open repair, therefore this needs to be done endovascularly. I feel the best place for this to be done is at [facility]. The services are not available at [facility]. After a lengthy discussion, the patient has agreed to return to see Dr. Pattricia Boss within the next 2 weeks. I will make a copy of the CT scan to accompany the patient to [facility]. He will contact me if any further assistance is required

## 2012-12-20 ENCOUNTER — Ambulatory Visit (INDEPENDENT_AMBULATORY_CARE_PROVIDER_SITE_OTHER): Payer: Medicare Other | Admitting: *Deleted

## 2012-12-20 DIAGNOSIS — I4891 Unspecified atrial fibrillation: Secondary | ICD-10-CM

## 2012-12-20 LAB — POCT INR: INR: 2.3

## 2013-01-31 ENCOUNTER — Ambulatory Visit (INDEPENDENT_AMBULATORY_CARE_PROVIDER_SITE_OTHER): Payer: Medicare Other | Admitting: *Deleted

## 2013-01-31 DIAGNOSIS — I4891 Unspecified atrial fibrillation: Secondary | ICD-10-CM

## 2013-01-31 LAB — POCT INR: INR: 2.4

## 2013-02-04 ENCOUNTER — Ambulatory Visit (INDEPENDENT_AMBULATORY_CARE_PROVIDER_SITE_OTHER): Payer: Medicare Other | Admitting: Pulmonary Disease

## 2013-02-04 ENCOUNTER — Encounter: Payer: Self-pay | Admitting: Pulmonary Disease

## 2013-02-04 ENCOUNTER — Other Ambulatory Visit (INDEPENDENT_AMBULATORY_CARE_PROVIDER_SITE_OTHER): Payer: Medicare Other

## 2013-02-04 VITALS — BP 114/72 | HR 74 | Temp 97.5°F | Ht 69.0 in | Wt 181.8 lb

## 2013-02-04 DIAGNOSIS — I5022 Chronic systolic (congestive) heart failure: Secondary | ICD-10-CM

## 2013-02-04 DIAGNOSIS — J449 Chronic obstructive pulmonary disease, unspecified: Secondary | ICD-10-CM

## 2013-02-04 DIAGNOSIS — R0989 Other specified symptoms and signs involving the circulatory and respiratory systems: Secondary | ICD-10-CM

## 2013-02-04 DIAGNOSIS — J4489 Other specified chronic obstructive pulmonary disease: Secondary | ICD-10-CM

## 2013-02-04 DIAGNOSIS — I712 Thoracic aortic aneurysm, without rupture, unspecified: Secondary | ICD-10-CM

## 2013-02-04 DIAGNOSIS — I10 Essential (primary) hypertension: Secondary | ICD-10-CM

## 2013-02-04 DIAGNOSIS — I739 Peripheral vascular disease, unspecified: Secondary | ICD-10-CM

## 2013-02-04 DIAGNOSIS — I4891 Unspecified atrial fibrillation: Secondary | ICD-10-CM

## 2013-02-04 DIAGNOSIS — R0600 Dyspnea, unspecified: Secondary | ICD-10-CM

## 2013-02-04 DIAGNOSIS — I509 Heart failure, unspecified: Secondary | ICD-10-CM

## 2013-02-04 LAB — CBC WITH DIFFERENTIAL/PLATELET
Basophils Absolute: 0 10*3/uL (ref 0.0–0.1)
Basophils Relative: 0.6 % (ref 0.0–3.0)
Eosinophils Absolute: 0.2 10*3/uL (ref 0.0–0.7)
Eosinophils Relative: 2.5 % (ref 0.0–5.0)
HCT: 41.9 % (ref 39.0–52.0)
Hemoglobin: 14.2 g/dL (ref 13.0–17.0)
Lymphocytes Relative: 15.7 % (ref 12.0–46.0)
Lymphs Abs: 1.3 10*3/uL (ref 0.7–4.0)
MCHC: 33.9 g/dL (ref 30.0–36.0)
MCV: 95.5 fl (ref 78.0–100.0)
Monocytes Absolute: 0.8 10*3/uL (ref 0.1–1.0)
Monocytes Relative: 9.5 % (ref 3.0–12.0)
Neutro Abs: 5.7 10*3/uL (ref 1.4–7.7)
Neutrophils Relative %: 71.7 % (ref 43.0–77.0)
Platelets: 138 10*3/uL — ABNORMAL LOW (ref 150.0–400.0)
RBC: 4.39 Mil/uL (ref 4.22–5.81)
RDW: 14.7 % — ABNORMAL HIGH (ref 11.5–14.6)
WBC: 8 10*3/uL (ref 4.5–10.5)

## 2013-02-04 LAB — BASIC METABOLIC PANEL
BUN: 26 mg/dL — ABNORMAL HIGH (ref 6–23)
CO2: 31 mEq/L (ref 19–32)
Calcium: 9.3 mg/dL (ref 8.4–10.5)
Chloride: 105 mEq/L (ref 96–112)
Creatinine, Ser: 1.5 mg/dL (ref 0.4–1.5)
GFR: 45.61 mL/min — ABNORMAL LOW (ref >60.00)
Glucose, Bld: 88 mg/dL (ref 70–99)
Potassium: 4.7 mEq/L (ref 3.5–5.1)
Sodium: 143 mEq/L (ref 135–145)

## 2013-02-04 LAB — PROTIME-INR
INR: 3 ratio — ABNORMAL HIGH (ref 0.8–1.0)
Prothrombin Time: 31 s — ABNORMAL HIGH (ref 10.2–12.4)

## 2013-02-04 LAB — HEPATIC FUNCTION PANEL
ALT: 32 U/L (ref 0–53)
AST: 38 U/L — ABNORMAL HIGH (ref 0–37)
Albumin: 3.7 g/dL (ref 3.5–5.2)
Alkaline Phosphatase: 129 U/L — ABNORMAL HIGH (ref 39–117)
Bilirubin, Direct: 0.4 mg/dL — ABNORMAL HIGH (ref 0.0–0.3)
Total Bilirubin: 1.8 mg/dL — ABNORMAL HIGH (ref 0.3–1.2)
Total Protein: 7 g/dL (ref 6.0–8.3)

## 2013-02-04 NOTE — Patient Instructions (Addendum)
Today we updated your med list in our EPIC system...    Continue your current medications the same...  Today we did your follow up blood work...    We will contact you w/ the results when available...     We will also fax a copy to [facility] at [facility]...  Call for any questions, or if we can be of assistance in any way...  Good luck with the upcoming surgery!

## 2013-02-05 ENCOUNTER — Ambulatory Visit (INDEPENDENT_AMBULATORY_CARE_PROVIDER_SITE_OTHER): Payer: Medicare Other | Admitting: Internal Medicine

## 2013-02-05 ENCOUNTER — Other Ambulatory Visit: Payer: Self-pay | Admitting: Pulmonary Disease

## 2013-02-05 DIAGNOSIS — I4891 Unspecified atrial fibrillation: Secondary | ICD-10-CM

## 2013-02-05 LAB — TSH: TSH: 6.21 u[IU]/mL — ABNORMAL HIGH (ref 0.35–5.50)

## 2013-02-05 LAB — BRAIN NATRIURETIC PEPTIDE: Pro B Natriuretic peptide (BNP): 263 pg/mL — ABNORMAL HIGH (ref 0.0–100.0)

## 2013-02-05 MED ORDER — LEVOTHYROXINE SODIUM 50 MCG PO TABS: 50.0000 ug | ORAL_TABLET | Freq: Every day | ORAL | Status: DC

## 2013-02-09 ENCOUNTER — Encounter: Payer: Self-pay | Admitting: Pulmonary Disease

## 2013-02-09 NOTE — Progress Notes (Signed)
Subjective:     Patient ID: Ryan Hess, male   DOB: 26-Dec-1923, 79 y.o.   MRN: [id number]  HPI 79 y/o WM here for a follow up visit... he has multiple medical problems as noted below...    ~  March 22, 2012:  76moROV & GDonivinhas been through a lot >> In Feb2013 he was [facility]- starting 5cm distal to the left subclavian down to the prox aspect of there prev [name] graft;  They chose to proceed w/ a fenestrated endovasc repair> done 01/01/12;  Disch on Coumadin for his AFib & Lovenox bridge but he developed a complic w/ lumbar epidural hematomas w/ paraplegia requiring readmission for laminectomy & evac of the hematomas;  [facility] and then [facility] but only spent 4d [facility] w/ altered mental status, pneumonia, AFib w/ rvr, & FTT> Neuro w/u was otherw neg (& he improved w/ supportive care); CXR showed L>R bibasilar airsp dis (covered w/ IV antibiotics & disch on Avelox); AFib was rate controlled w/ med adjustments & Coumadin continued; Urinary retension required foley cath & Urology outpt management; his protimes got too thin & he developed hematuria & acute blood loss anemia; now BP has been low necessitating a HOLD on his BP med...  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  Bilat lower lobe pneumonias treated in-pt 5/13 and disch on Avelox > He is afeb & starting to feel better, actually looks pretty good (just pale); CXR today showed resolution of infiltrates...  AFib w/ RVR> meds adjusted and Coumadin carefully monitored; Heart rate= 62 off Atenolol; Protime=13.6/1.2 Off Coumadin x  several days now; decision will need to be made regarding restart coumadin when safe...  Urinary retension from the lumbar surg and hematuria developing while on coumadin > he has foley cath & leg bag w/ clear yellow urine at present; appt to see DrWrenn in 3d to consider further options (?voiding trial, in&out cath, etc)...  Hypotension w/ weakness & required down titration in BP meds & careful monitoring of BP etc > currently on Lasix40, K10Bid; BP= 118/66 and has varied 90's/70's to 120's/80's at home OFF his prev Aten25 & Losartan50;  Chem's are WNL & BNP=552...  Anemia > He developed acute blood loss anemia w/ post op Hg= 11-12 but dropped to 8.6 w/ hematuria & INR=4.2; Coumadin & ASA placed on HOLD; f/u labs showed Hg=8.4, Fe=31 (11%sat);  Rec to start FeSO4 Bid now...  Complaints of constipation > we reviewed the need for MIRALAX 17gm in water daily, and SENAKOT-S 2tabs at bedtime... We reviewed prob list, meds, xrays and labs> CXR 6/13 showed stable cardiac silhouette, sl incr markings chronically, NAD, no edema, Ao stent in desc Ao appears stable... LABS 6/13:  Chems- ok w/ BUN=22 Creat=1.4 BNP=552;  CBC- Hg=8.4 Fe=31(11%sat) B12=953;  Protime=13.6/ INR1.2;  TSH=6.66  ~  May 03, 2012:  6wk ROV & post hosp follow up> GEddielpresented to the ER 7/23 - 04/25/12 w/ abd pain- ELap surg revealed ~5 feet of frankly ischemic small bowel, likely due to internal hernia and closed loop obstruction=> Bowel resected &  pulses intact w/ no evidence of embolic or thrombotic pathology;  Viable small bowel anastomosed & wound vac applied; he had a stable post op course=> foley left in place;  Disch 8/1 w/ home health etc...    Since disch we had call from son regarding pts Cardiac meds and BP> when last seen his BP was low & he was weak- Aten & Losartan placed on hold, continued on Lasix40, he's been off his Coumadin since 6/13 w/ hematuria/ anemia=> hasn't yet been restarted & only covered transiently in hosp  w/ Lovenox, disch on ASA81, no coumadin;  BP since disch was still low (95/70) so Aten, Losartan kept on [place] continued...     He is pale & weak but gaining strength he says;  He saw Urology 8/8 w/ adeq voiding trial & Foley removed, voiding satis so far;  Since disch he's noted some pitting edema in LEs and L>R arm swelling (he had left IJ line in hosp)=> we decided to check VenDopplers ASAP & prelim report indicates no subclavian clots;  Needs Cards follow up & decision regarding restart of Coumadin w/ careful protime monitoring...  We reviewed prob list, meds, xrays and labs >> LABS 7/13 in hosp near disch> Hg=8.2, MCV=93, BS=ok, K=3.3, Creat=0.85, Alb=3.2, LFTs=wnl... CXR 7/13 showed cardiomeg, intubated, low lung vols & bilat LL airsp dis L>R... 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  ~  May 23, 2012:  3wk ROV & Laronn's swelling is diminished & his weight is down 13# to 173# today on Lasix40 & K10Bid;  BP= 112/70 & his strength is sl better w/ Losartan50 on HOLD & son is giving him Aten25 only if BP>110 sys... NOTE> he has restarted the ASA'81mg'$ /d but his COUMADIN is still on hold & pending Cardiology appt next week (Off Coumadin since 6/13 due to hematuria, then abd pain/ ELap, etc; he has had several coumadin complications)...    We reviewed prob list, meds, xrays and labs> see below for updates >>  ~  July 08, 2012:  6wk ROV & Ryan Hess is stable, slowly gaining strength, wt up 3# to 176# today, BP well regulated on his Lasix40 & takes the Aten25 only if BP>110;  Breathing stable on Advair250 + Spiriva & he uses the Mucinex as needed;  Due for f/u CBC & Fe on his FeSO4 '325mg'$ Bid...    We reviewed prob list, meds, xrays and labs> see below for updates >> OK Flu vaccine today... LABS 10/13:  Chems- wnl;  CBC- improved w/ Hg=13.2 & Fe looks good etc...  ~  October 07, 2012:  42moROV & GHosiais much better, stable, just c/o some back discomfort;  We reviewed the following medical problems during today's office visit >>     COPD, HxPneumonia> on Advair250, Spiriva, Mucinex; last CT 12/12 w/ severe emphysema & scarring, plus his extensive atherosclerotic dis/ ectatic thorAo/ aneurysm/ etc; his breathing is stable- mild cough, min sput, no hemoptysis, stable DOE, etc...     HBP, CHF, AFib w/ hx clot in LA appendage- resolved on Coumadin> on ASA81, Aten25, Lasix40, K10Bid; followed by DBurkina Fasofor Cards- seen 12/13 & his note is reviewed, no changes made...    Peripheral Vasc Dis> on ASA81 & Coumadin; followed by DrGearhardt [facility]Surg  [facility]see extensive hx below)...  Chol> on Lip10; last FLP 12/12 showed TChol 107, TG 38, HDL 40, LDL 60    GI- Divertics, Polyps, Hx ischemic bowel> the latter was due to an internal hernia & closed loop obstruction & not from any thrombotic or embolic phenomenon; he has done well since the surgery...    GU- prostate cancer> followed by DrWrenn & he elected XRT- admin by DrKinard in 2009; he has a slowly rising PSa w/ a doubling time of 6-44mo he is considerinjg androgen ablation if necessary...    DJD> on Oxy-IR '5mg'$  prn; he is s/p bilat TKRs by DrOlin    Hx Anemia> on Fe &VI- Hg increased from 9.5 post op to 13.2 w/ Fe=70 (21% sat) in Oct2013... We reviewed prob list, meds, xrays and labs> see below for updates >>  CXR 1/14 showed cardiomegaly, aortic stent stable, clear lungs w/ basilar scarring, NAD...   ~  Feb 04, 2013:  450moOV & GeAveris [facility] in his planned thoracoabd aneurysm repair- he had his situation reviewed by DrBrabham here & [facility] since this needs to be done endovascularly & is quite complicated... We reviewed the following medical problems during today's office visit >>     COPD, HxPneumonia> on Advair250,  Spiriva, Mucinex; last CT 12/12 w/ severe emphysema & scarring, plus his extensive atherosclerotic dis/ ectatic thorAo/ aneurysm/ etc; his breathing is stable- mild cough, min sput, no hemoptysis, stable DOE, etc...     HBP, CHF, AFib w/ hx clot in LA appendage- resolved on Coumadin> on ASA81, Aten25, Lasix40, K10Bid; BP= 114/72 & he denies CP, palpit etc; followed by DrCherly Hensenor Cards- his notes are reviewed, stable- no changes made...    Peripheral Vasc Dis> on ASA81 & Coumadin; [facility] AAA repair by DrToa Bajahe had a Thoracoabd Ao Aneurysm from the subclavian down to the prev Ao-bi-iliac graft, they decided on a fenestrated endovascular repair & a staged procedure- 4/13 they did the Thoracic endovasc aneurysm repair & he developed mult post op complications (see above 03/22/12 entry)... DrFarbers note indicates that to finish the repair he will require 3 branches and a left renal fenestration, they quoted him a risk of paraplegia in the 10-15% range & he will need preadmission for a lumbar drain to be placed, etc; [facility]    Chol> on Lip10; last FLP 12/12 showed TChol 107, TG 38, HDL 40, LDL 60    Hypothyroid> we have been following his TSH levels and he has been consistently 6-7 range; Labs 5/14 showed TSH=6.21 & we decided to start [id number]     GI- Divertics, Polyps, Hx ischemic bowel> the latter was due to an internal hernia & closed loop obstruction & not from any thrombotic or embolic phenomenon; he has done well since the surgery 7/13...    GU- BPH, hx prostate cancer, Renal insuffic> on Flomax0.4; followed by DrWrenn & he elected XRT- admin by DrKinard in 2009; he has a slowly rising PSA w/ a doubling time of 6-64mo41mo is considerinjg androgen ablation if necessary, Cr=1.5...  Marland KitchenMarland KitchenDJD> on Oxy-IR '5mg'$  prn; he is s/p bilat TKRs by DrOlin...    Hx Anemia> prev on Fe; Hg increased from 9.5 post op to 13.2 w/ Fe=70 (21% sat) in  Oct2013... We reviewed prob list, meds, xrays and labs> see below for  updates >>  LABS 5/14:  Chems- ok w/ stable mild RI (Cr=1.5);  CBC- wnl w/ Hg=14.2;  TSH=6.21 (Synthroid50 started);  BNP=263;  Protimes adjusted by the CC...           Problem List:  COPD (ICD-496) Hx of PNEUMONIA, ORGANISM UNSPECIFIED (ICD-486) PULMONARY NODULE & CAVITY RLL (ICD-518.89) - he is an ex-smoker, having quit in [place] after 40 yrs of smoking... he was exercising regularly and walking daily ~78m 5-6 days per week... on ASpringviewdaily, +MUCINEX 2Bid w/ Fluids... ~  baseline CXR & CTChest w/ marked emphysema, atheromatous calcif, 5cm desc thorAA...  ~  CXR & CTA 2/11 in hosp showed cardiomeg + coronary calcif, advanced atherosclerosis of Ao w/ aneuryms 5.8cm into abd, underlying emphysema w/ interst edema & bilat effusions, no PE... ~  4/11:  RLL pneumonia which was slow to clear... ~  6/11:  f/u CXR & CT Chest 6/11 w/ 6cm cavitary area RLL & nodular component inferiorly, +hilar adenopathy, no mediastinal nodes, etc; Tumor markers showed CEA=7.5 & Ca19-9= 10.8; > IR felt lesion was too hi risk for bx, therefore contin Rx & observe. ~  8/11:  f/u CXR w/o change in RLL opac (no worsening)>> repeat CEA=6.5; plan f/u CT Chest in 240mo~  10/11: f/u CT Chest showed interval decrease in size of RLL cavitary lesion & the inferiorly placed soft tissue component; otherw there is severe emphysema, cardiomeg, coronary calcif, thoracoabd aneurysm w/o change; there was an air-fluid level in a dilated esoph> check Ba Esophagram= Nonspecific esophageal motility disorder with very poor primary esophageal contractions (no HH, stricture, etc)... ~  12/12: f/u CTChest showed severe emphysema & scarring w/ bibasilar atx (no infiltrates no cavities), markedly tortuous & ectaticThorAo w/ extensive atherosclerotic changes & no change in decr thor aneurysm measuring 5.9 x 5.6cm, mild cardiomeg & dense coronary calcif as well... ~   4/13:  He had a fenestrated endovasc repair of [facility]abdominal AA [facility]. ~  subseq CXRs thru 7/13 hosp w/ cardiomeg, low lung vols, bibasilar airsp dis, etc... ~  CXR 1/14 showed cardiomeg, bibasilar fibrosis, thoracic aortic stent noted, NAD...   HYPERTENSION (ICD-401.9)                         << followed by DrCherly Hensenor Cards >> CONGESTIVE HEART FAILURE (ICD-428.0) ATRIAL FIBRILLATION (ICD-427.31) w/ clot in left atrial appendage >> resolved on f/u TEE, on COUMADIN per Cards & cardioverted. PREMATURE VENTRICULAR CONTRACTIONS, FREQUENT (ICD-427.69)  MEDS> prev on ATENOLOL '25mg'$ /d, & LASIX '20mg'$ -2AM + KCl 1067mid, and off Losartan... prev Norvasc discontinued & hx ACE cough in the past on Lisinopril. ~  NuclearStressTest 1/05 was neg- no ischemia or infarct (+diaphrag attenuation), EF=56%... ~  repeat Nuclear study 6/09 was neg- no ischemia, mild inferoapic thinning, not gated due to PVCs... ~  2DEcho 6/09 showed mild dilated LV w/ EF= 45-50% but no regional wall motion abn, mild AoV calcif & AI, LA mild dil... ~  [place]10[place]  [place] ~  2/11:  in hosp- Cath= mild nonobstructive 3 vessel CAD, mod LVD w/ EF=40-45%; TEE= CHF w/ EF~45% w/ HK & left atrial appendage clot & AFib; he was diuresed & Norvasc stopped, placed on Coumadin w/ careful f/u by DrNCherly Hensenthe CC... ~  [facility]... ~  Recurrent AFib w/ RVR after his Thoracoabdominal stent graft 4/13... ~  6/13:  BP was low assoc w/ GU bleeding & ATENOLOL25 & LOSARTAN50 placed on HOLD... ~  COUMADIN on HOLD since GU bleeding & acute blood loss anemia; it has not yet been restarted... ~  8/13:  BP= 116/78 on LASIX '40mg'$ /d & K10-Bid;  Wt down 14# to 173# ~  10/13:  BP= 114/82 on Lasix40+K1-Bid (he takes Aten25 if BP>110 at home)... ~  12/13: he had Cards f/u DrNishan> note reviewed, no change in meds, he referred him to DrGearhart for f/u stent as he did not want to  [facility]... ~  5/14: HBP, CHF, AFib w/ hx clot in LA appendage- resolved on Coumadin> on ASA81, Aten25, Lasix40, K10Bid; BP= 114/72 & he denies CP, palpit etc; followed by Cherly Hensen for Cards- his notes are reviewed, stable- no changes made.  PERIPHERAL VASCULAR DISEASE (ICD-443.9) - back on ASA '81mg'$ /d... he is s/p infrarenal AAA repair w/ right common iliac aneurysm repair (via Ao Bi-iliac graft) in 1998 by DrLawson... also has a known desc thor AA measuring ~5+cm and followed by DrGearhart...  ~  seen by DrGearhart 4/10 w/ CT scan showing ~5cm thoracoabdominal aneurysm w/o signif change...  ~  seen by DrGearhart 2/11 in hosp w/ sl incr size of AAA- f/u planned 49mo ~  also followed by [facility]- DrCooper. ~  seen by DrGearhardt 10/11 & stable, no change, f/u 1 yr. ~  Seen 12/12 by DrGearhardt & CTscan is stable, continue conservative approach... ~  2/13:  [facility]... ~  4/13:  He had a [facility]estrated [facility]asc [facility] complic by lumbar epidural hematomas (on Coumadin) w/ paraplegia requiring readmission for laminectomy & evac of the hematomas;  [facility] and then [facility] but only spent 4d there [facility] w/ altered mental status, pneumonia, AFib w/ rvr, & FTT> Neuro w/u was otherw neg (& he improved w/ supportive care)... ~  2/14:  CT Chest, Abdomen, Pelvis> per DrBrabham - see report (the abdominal component has enlarged)... ~  [facility]...  HYPERCHOLESTEROLEMIA (ICD-272.0) - on LIPITOR '10mg'$ /d...  ~  FNowthen5/07 showed TChol 115, Tg 58, HDL 36, LDL 67 ~  FLP 5/08 showed TChol 118, TG 66, HDL 31, LDL 74 ~  FLP 5/09 showed TChol 126, TG 71, HDL 37, LDL 75 ~  FLP 6/10 showed TChol 116, TG 73, HDL 40, LDL 62 ~  2/11:  FLP not checked during the  hospitalization... ~  FLP 4/11 in hosp showed TChol 94, TG 52, HDL 23, LDL 61 ~  FLP 12/12 on Lip10 showed TChol 107, TG 38, HDL 40, LDL 60  HYPOTHYROIDISM >> he remains clinically euthyroid... ~  Labs 12/12 showed TSH= 6.10 ~  Labs 6/13 showed TSH= 6.66 ~  Labs 5/14 showed TSH= 6.21... We decided to start SYNTHROID 540m/d...  DIVERTICULOSIS OF COLON (ICD-562.10) & COLONIC POLYPS (ICD-211.3) - hx polyps in 2003 = tubular adenoma... ~  last colonoscopy 9/06 showed divertics, hems, no polyps.  ISCHEMIC BOWEL >> GeDoytresented to the ER 7/23 - 04/25/12 w/ abd pain- ELap surg revealed ~5 feet of frankly ischemic small bowel, likely due to internal hernia and closed loop obstruction=> Bowel resected & pulses intact w/ no evidence of embolic or thrombotic pathology;  Viable small bowel anastomosed & wound vac applied; he had a stable post op course...  PROSTATE CANCER (ICD-185) - eval by DrWrenn and they decided on XRT by DrKinard- finished 5/09 and he states that DrKinard "released me"... he sees DrWrenn every 6 months & they follow PSA closely (notes reviewed)... ~  8/12: f/u prostate cancer dx in 2009 & treated w/ XRT completed 6/09; slowly rising PSA w/ doubling time 6-58mo min voiding symptoms, he is considering androgen ablation if needed... ~  6/13: He developed hematuria & urinary retention requiring Urology eval, discontinuation of Coumadin, & Foley placement=> subseq removed 8/13 w/ adeq voiding trial... ~  3/14:  He had f/u visit w/ DrWrenn- PSA nadir after XRT was <0.04 and it has climbed to 0.54 (3/14) w/ PSADT of ~128yrthey chose to continue watchful waiting...  DEGENERATIVE JOINT DISEASE (ICD-715.90) - s/p bilat TKR's, [facility]- DrOlin... He has OXY-IR '5mg'$  as needed for pain...  ACTINIC SKIN DAMAGE (ICD-692.70) - followed by DrBrunetta Jeanshe knows to avoid sun exposure, use sun screen, etc...  ANEMIA >> ~  Labs 6/13 showed Hg= 8.4, Fe= 31 (11%sat); Rec to start FeSO4 Bid... ~  Labs  7/13 in hosp showed Hg= 12.6=>8.2 at disch... ~  Labs 8/13 showed Hg= 9.5, MCV= 94; rec to continue FeSO4 Bid... ~  Labs 10/13 showed Hg= 13.2 & ok to wean Fe to 1/d til gone then stop... ~  Labs 5/14 showed Hg= 14.2   Past Surgical History  Procedure Laterality Date  . Total knee arthroplasty      bilateral  . Abdominal aortic aneurysm repair  1998  . Appendectomy    . Inguinal hernia repair      left  . Joint replacement    . Laparotomy  04/16/2012    Procedure: EXPLORATORY LAPAROTOMY;  Surgeon: BrMadilyn HookDO;  [place] [facility] Service: General;  Laterality: N/A;  exploratory Laparotomy,Small bowel ressection abdominal wound vac placement.  . Laparotomy  04/18/2012    Procedure: EXPLORATORY LAPAROTOMY;  Surgeon: ThJoyice FasterCornett, MD;  Location: [facility] Service: General;  Laterality: N/A;  exploratory laparotomy with small bowel anastomosis  . Application of wound vac  04/18/2012    Procedure: APPLICATION OF WOUND VAC;  Surgeon: ThJoyice FasterCornett, MD;  Location: [facility] Service: General;  Laterality: N/A;  Removal of abdominal wound vac, Application of incisional  wound vac    Outpatient Encounter Prescriptions as of 02/04/2013  Medication Sig Dispense Refill  . ADVAIR DISKUS 250-50 MCG/DOSE AEPB inhale 1 dose by mouth twice a day  60 each  6  . aspirin 81 MG tablet Take 81 mg by mouth daily.       . Marland Kitchentenolol (TENORMIN) 25 MG tablet Take 25 mg by mouth daily.       . Marland Kitchentorvastatin (LIPITOR) 10 MG tablet Take 1 tablet (10 mg total) by mouth daily.  90 tablet  3  . furosemide (LASIX) 40 MG tablet Take 1 tablet (40 mg total) by mouth daily.  30 tablet  5  . guaiFENesin (MUCINEX) 600 MG 12 hr tablet Take 600 mg by mouth 2 (two) times daily. COPD.      . Multiple Vitamin (MULTIVITAMIN) tablet Take 1 tablet by mouth daily.        . Marland KitchenxyCODONE (OXY IR/ROXICODONE) 5 MG immediate release tablet As needed for pain      . potassium chloride (K-DUR) 10 MEQ tablet Take 1 tablet (10 mEq total) by  mouth  2 (two) times daily.  180 tablet  3  . Tamsulosin HCl (FLOMAX) 0.4 MG CAPS Take 0.4 mg by mouth at bedtime.       [name] tiotropium (SPIRIVA HANDIHALER) 18 MCG inhalation capsule Place 1 capsule (18 mcg total) into inhaler and inhale daily.  90 capsule  3  . warfarin (COUMADIN) 2.5 MG tablet Take 1 tablet (2.5 mg total) by mouth as directed.  90 tablet  1  . warfarin (COUMADIN) 2.5 MG tablet USE AS DIRECTED BY ANTICOAGULATION CLINIC.  30 tablet  3  . [DISCONTINUED] ferrous sulfate 325 (65 FE) MG tablet Take 325 mg by mouth 2 (two) times daily.       No facility-administered encounter medications on file as of 02/04/2013.    No Known Allergies   Current Medications, Allergies, Past Medical History, Past Surgical History, Family History, and Social History were reviewed in [employer] record.   Review of Systems        See HPI - all other systems neg except as noted...  The patient complains of dyspnea on exertion.  The patient denies anorexia, fever, weight loss, weight gain, vision loss, decreased hearing, hoarseness, chest pain, syncope, peripheral edema, prolonged cough, headaches, hemoptysis, abdominal pain, melena, hematochezia, severe indigestion/heartburn, hematuria, incontinence, muscle weakness, suspicious skin lesions, transient blindness, difficulty walking, depression, unusual weight change, abnormal bleeding, enlarged lymph nodes, and angioedema.     Objective:   Physical Exam    WD, WN, 79 y/o WM in NAD... GENERAL:  Alert & oriented; pleasant & cooperative; pale complexion... HEENT:  [place]/AT, EOM-wnl, PERRLA, EACs-clear, TMs-wnl, NOSE-clear, THROAT-clear & wnl. NECK:  Supple w/ fairROM; no JVD; normal carotid impulses w/o bruits; no thyromegaly or nodules palpated; no lymphadenopathy. CHEST:  decr BS bilat, scat bibasilar rales, w/o wheezing/ rhonchi/ signs of consolidation. HEART:  irregular rhythm, gr1/6 diast murmur in Ao area, without rubs or  gallops detected... ABDOMEN:  Soft & nontender; normal bowel sounds; no organomegaly or masses palpated... EXT: s/p bilat TKR's, mod arthritic changes; no varicose veins/ +venous insuffic/ no edema. NEURO:  CN's intact;  gait abn; no focal neuro deficits... DERM:  No lesions noted; no rash etc...  RADIOLOGY DATA:  Reviewed in the EPIC EMR & discussed w/ the patient...  LABORATORY DATA:  Reviewed in the EPIC EMR & discussed w/ the patient...   Assessment:      COPD, Hx Pneumonia>  Prev CXR/CT abn has resolved back to baseline severe COPD/Emphysema w/ Cardiomeg, Aneurysm & bibasilar fibrosis...  HBP>  controlled on Aten50-1/2, Lasix40, K10Bid; continue same...  CAD/ CHF/ etc>  Followed by Cherly Hensen & his prev notes are reviewed;  BNP is stable...  AFib>  On above + Coumadin followed in the CC...  Periph Vasc Dis>  S/p AAA repair 1998 by Sheryn Bison; then followed by DrGearhardt for thoraco-abd aneurysm & referred to [facility] for stent graft surg- done 4/13 by DrFarber w/ complications as noted...  Ven Insuffic & EDEMA>  Improved w/ Lasix40/d... He knows to elim salt, elev legs, wear support hose, etc...  CHOL>  FLP looks good on Lip10...  GI> Presbyesoph, Divertics, Hx polpyps>  Stable & followed by DrDBrodie... 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  Prostate Cancer>  Followed by DrWrenn  w/ slowly rising PSA as noted; then developed urinary retention after lumbar surg and hematuria after foley & coumadin; now back to baseline &  being followed...  DJD>  Followed by DrOlin, s/p bilat TKRs...  ANEMIA>  He developed acute blood loss anemia w/ post op Hg= 11-12 but dropped to 8.6 w/ hematuria & INR=4.2; Coumadin & ASA placed on HOLD; f/u labs showed Hg=8.4 & Fe=31 & started on FeSO4 Bid;  Then Hg=9.5 & rec to continue Fe Bid;  Cards restarted his Coumadin;  Now Hg= 13.2 & Fe improved, ok  to wean off Fe...  Actinic skin changes>  Aware, he is monitored by Derm...     Plan:     Patient's Medications  New Prescriptions   No medications on file  Previous Medications   ADVAIR DISKUS 250-50 MCG/DOSE AEPB    inhale 1 dose by mouth twice a day   ASPIRIN 81 MG TABLET    Take 81 mg by mouth daily.    ATENOLOL (TENORMIN) 25 MG TABLET    Take 25 mg by mouth daily.    ATORVASTATIN (LIPITOR) 10 MG TABLET    Take 1 tablet (10 mg total) by mouth daily.   FUROSEMIDE (LASIX) 40 MG TABLET    Take 1 tablet (40 mg total) by mouth daily.   GUAIFENESIN (MUCINEX) 600 MG 12 HR TABLET    Take 600 mg by mouth 2 (two) times daily. COPD.   MULTIPLE VITAMIN (MULTIVITAMIN) TABLET    Take 1 tablet by mouth daily.     OXYCODONE (OXY IR/ROXICODONE) 5 MG IMMEDIATE RELEASE TABLET    As needed for pain   POTASSIUM CHLORIDE (K-DUR) 10 MEQ TABLET    Take 1 tablet (10 mEq total) by mouth 2 (two) times daily.   TAMSULOSIN HCL (FLOMAX) 0.4 MG CAPS    Take 0.4 mg by mouth at bedtime.    TIOTROPIUM (SPIRIVA HANDIHALER) 18 MCG INHALATION CAPSULE    Place 1 capsule (18 mcg total) into inhaler and inhale daily.   WARFARIN (COUMADIN) 2.5 MG TABLET    Take 1 tablet (2.5 mg total) by mouth as directed.   WARFARIN (COUMADIN) 2.5 MG TABLET    USE AS DIRECTED BY ANTICOAGULATION CLINIC.  Modified Medications   Modified Medication Previous Medication   LEVOTHYROXINE (SYNTHROID, LEVOTHROID) 50 MCG TABLET levothyroxine (SYNTHROID, LEVOTHROID) 50 MCG tablet      Take 1 tablet (50 mcg total) by mouth daily before breakfast.    Take 50 mcg by mouth daily before breakfast.  Discontinued Medications   FERROUS SULFATE 325 (65 FE) MG TABLET    Take 325 mg by mouth 2 (two) times daily.

## 2013-03-14 ENCOUNTER — Other Ambulatory Visit: Payer: Self-pay | Admitting: Pharmacist

## 2013-03-14 ENCOUNTER — Ambulatory Visit (INDEPENDENT_AMBULATORY_CARE_PROVIDER_SITE_OTHER): Payer: Medicare Other

## 2013-03-14 DIAGNOSIS — I4891 Unspecified atrial fibrillation: Secondary | ICD-10-CM

## 2013-03-14 LAB — POCT INR: INR: 2.1

## 2013-03-14 MED ORDER — WARFARIN SODIUM 2.5 MG PO TABS: 2.5000 mg | ORAL_TABLET | ORAL | Status: DC

## 2013-03-25 ENCOUNTER — Encounter: Payer: Self-pay | Admitting: Adult Health

## 2013-03-25 ENCOUNTER — Other Ambulatory Visit: Payer: Self-pay | Admitting: Adult Health

## 2013-03-25 ENCOUNTER — Ambulatory Visit (INDEPENDENT_AMBULATORY_CARE_PROVIDER_SITE_OTHER): Payer: Medicare Other | Admitting: Adult Health

## 2013-03-25 ENCOUNTER — Telehealth: Payer: Self-pay | Admitting: Pulmonary Disease

## 2013-03-25 ENCOUNTER — Ambulatory Visit (HOSPITAL_BASED_OUTPATIENT_CLINIC_OR_DEPARTMENT_OTHER)
Admission: RE | Admit: 2013-03-25 | Discharge: 2013-03-25 | Disposition: A | Discharge status: Home / Self Care | Payer: Medicare Other | Source: Ambulatory Visit | Attending: Adult Health | Admitting: Adult Health

## 2013-03-25 VITALS — BP 124/62 | HR 102 | Temp 97.8°F | Ht 69.0 in | Wt 181.1 lb

## 2013-03-25 DIAGNOSIS — R042 Hemoptysis: Secondary | ICD-10-CM

## 2013-03-25 DIAGNOSIS — J189 Pneumonia, unspecified organism: Secondary | ICD-10-CM

## 2013-03-25 DIAGNOSIS — R918 Other nonspecific abnormal finding of lung field: Secondary | ICD-10-CM | POA: Insufficient documentation

## 2013-03-25 MED ORDER — MOXIFLOXACIN HCL 400 MG PO TABS: 400.0000 mg | ORAL_TABLET | Freq: Every day | ORAL | Status: DC

## 2013-03-25 NOTE — Assessment & Plan Note (Signed)
RUL PNA w/ associated hemoptysis on coumadin  Coumadin held by pt x 4 days  Will check INR today , if subtherapeutic , restart coumadin cautiously with close couamdin clinic follow up  Begin abx and have him return in 1 week with follow up film   Plan  Begin Avelox 400mg  daily x 7 day  Mucinex DM Twice daily  As needed  Cough/congestion  Fluids and rest  Restart coumadin tomorrow previous dose.  Follow up with coumadin clinic in 2 days  follow up with our office in 1 week with chest xray  If bleeding worsens or does not stop call our office or seek Emergency care.  I will call with lab results.  Please contact office for sooner follow up if symptoms do not improve or worsen or seek emergency care

## 2013-03-25 NOTE — Patient Instructions (Addendum)
Begin Avelox 400mg  daily x 7 day  Mucinex DM Twice daily  As needed  Cough/congestion  Fluids and rest  Restart coumadin tomorrow previous dose.  Follow up with coumadin clinic in 2 days  follow up with our office in 1 week with chest xray  If bleeding worsens or does not stop call our office or seek Emergency care.  I will call with lab results.  Please contact office for sooner follow up if symptoms do not improve or worsen or seek emergency care

## 2013-03-25 NOTE — Telephone Encounter (Signed)
Called spoke with pt's son [name] who reports pt has been having hemoptysis over the weekend x2.  Pain in stomach reminded him of ulcer-type pain when he was younger.  Quarter amount BRB "off and on", "chunk" this morning of BRB again.  Some SOB.  Denies f/c/s, wheezing, chest tightness.  OV scheduled with TP in [facility] office this AM at 1015.

## 2013-03-25 NOTE — Progress Notes (Signed)
Subjective:     Patient ID: Ryan Hess, male   DOB: [birth year], 79 y.o.   MRN: [id number]  HPI 79 y/o ~  March 22, 2012:  28moROV & GRoyaltyhas been through a lot >> In Feb2013 he [facility]- starting 5cm distal to the left subclavian down to the prox aspect of there prev [name] graft;  They chose to proceed w/ a fenestrated endovasc repair> done 01/01/12;  Disch on Coumadin for his AFib & Lovenox bridge but he developed a complic w/ lumbar epidural hematomas w/ paraplegia requiring readmission for laminectomy & evac of the hematomas;  [facility] then [facility] but only spent 4d there [facility] by [facility] w/ altered mental status, pneumonia, AFib w/ rvr, & FTT> Neuro w/u was otherw neg (& he improved w/ supportive care); CXR showed L>R bibasilar airsp dis (covered w/ IV antibiotics & disch on Avelox); AFib was rate controlled w/ med adjustments & Coumadin continued; Urinary retension required foley cath & Urology outpt management; his protimes got too thin & he developed hematuria & acute blood loss anemia; now BP has been low necessitating a HOLD on his BP med...  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  Bilat lower lobe pneumonias treated in-pt 5/13 and disch on Avelox > He is afeb & starting to feel better, actually looks pretty good (just pale); CXR today showed resolution of infiltrates...  AFib w/ RVR> meds adjusted and Coumadin carefully monitored; Heart rate= 62 off Atenolol; Protime=13.6/1.2 Off Coumadin x several days now; decision will need to be made regarding restart coumadin when  safe...  Urinary retension from the lumbar surg and hematuria developing while on coumadin > he has foley cath & leg bag w/ clear yellow urine at present; appt to see DrWrenn in 3d to consider further options (?voiding trial, in&out cath, etc)...  Hypotension w/ weakness & required down titration in BP meds & careful monitoring of BP etc > currently on Lasix40, K10Bid; BP= 118/66 and has varied 90's/70's to 120's/80's at home OFF his prev Aten25 & Losartan50;  Chem's are WNL & BNP=552...  Anemia > He developed acute blood loss anemia w/ post op Hg= 11-12 but dropped to 8.6 w/ hematuria & INR=4.2; Coumadin & ASA placed on HOLD; f/u labs showed Hg=8.4, Fe=31 (11%sat);  Rec to start FeSO4 Bid now...  Complaints of constipation > we reviewed the need for MIRALAX 17gm in water daily, and SENAKOT-S 2tabs at bedtime... We reviewed prob list, meds, xrays and labs> CXR 6/13 showed stable cardiac silhouette, sl incr markings chronically, NAD, no edema, Ao stent in desc Ao appears stable... LABS 6/13:  Chems- ok w/ BUN=22 Creat=1.4 BNP=552;  CBC- Hg=8.4 Fe=31(11%sat) B12=953;  Protime=13.6/ INR1.2;  TSH=6.66  ~  May 03, 2012:  6wk ROV & post hosp follow up> GRoquanpresented to the ER 7/23 - 04/25/12 w/ abd pain- ELap surg revealed ~5 feet of frankly ischemic small bowel, likely due to internal hernia and closed loop obstruction=> Bowel resected & pulses intact w/ no evidence of embolic or thrombotic pathology;  Viable small bowel anastomosed & wound vac  applied; he had a stable post op course=> foley left in place;  Disch 8/1 w/ home health etc...    Since disch we had call from son regarding pts Cardiac meds and BP> when last seen his BP was low & he was weak- Aten & Losartan placed on hold, continued on Lasix40, he's been off his Coumadin since 6/13 w/ hematuria/ anemia=> hasn't yet been restarted & only covered transiently in hosp w/ Lovenox, disch on ASA81, no coumadin;  BP since disch was still low (95/70)  so Aten, Losartan kept on [place] continued...     He is pale & weak but gaining strength he says;  He saw Urology 8/8 w/ adeq voiding trial & Foley removed, voiding satis so far;  Since disch he's noted some pitting edema in LEs and L>R arm swelling (he had left IJ line in hosp)=> we decided to check VenDopplers ASAP & prelim report indicates no subclavian clots;  Needs Cards follow up & decision regarding restart of Coumadin w/ careful protime monitoring...  We reviewed prob list, meds, xrays and labs >> LABS 7/13 in hosp near disch> Hg=8.2, MCV=93, BS=ok, K=3.3, Creat=0.85, Alb=3.2, LFTs=wnl... CXR 7/13 showed cardiomeg, intubated, low lung vols & bilat LL airsp dis L>R... 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  ~  May 23, 2012:  3wk ROV & Maclane's swelling is diminished & his weight is down 13# to 173# today on Lasix40 & K10Bid;  BP= 112/70 & his strength is sl better w/ Losartan50 on HOLD & son is giving him Aten25 only if BP>110 sys... NOTE> he has restarted the ASA'81mg'$ /d but his COUMADIN is still on hold & pending Cardiology appt next week (Off Coumadin since 6/13 due to hematuria, then abd pain/ ELap, etc; he has had several coumadin complications)...    We reviewed prob list, meds, xrays and labs> see below for updates >>  ~  July 08, 2012:  6wk ROV & Cecilio is stable, slowly gaining strength, wt up 3# to 176# today, BP well regulated on his Lasix40 & takes the Aten25 only if BP>110;  Breathing stable on Advair250 + Spiriva & he uses the Mucinex as needed;  Due for f/u CBC & Fe on his FeSO4 '325mg'$ Bid...    We reviewed prob list, meds, xrays and labs> see below for updates >> OK Flu vaccine today... LABS 10/13:  Chems- wnl;  CBC- improved w/ Hg=13.2 & Fe looks good etc...  ~  October 07, 2012:  60moROV & GMaxamillionis much better, stable, just c/o some back  discomfort;  We reviewed the following medical problems during today's office visit >>     COPD, HxPneumonia> on Advair250, Spiriva, Mucinex; last CT 12/12 w/ severe emphysema & scarring, plus his extensive atherosclerotic dis/ ectatic thorAo/ aneurysm/ etc; his breathing is stable- mild cough, min sput, no hemoptysis, stable DOE, etc...     HBP, CHF, AFib w/ hx clot in LA appendage- resolved on Coumadin> on ASA81, Aten25, Lasix40, K10Bid; followed by DBurkina Fasofor Cards- seen 12/13 & his note is reviewed, no changes made...    Peripheral Vasc Dis> on ASA81 & Coumadin; followed by DrGearhardt for VascSurg  [facility]see extensive hx below)...    Chol> on Lip10; last FLP 12/12 showed TChol 107, TG 38, HDL 40, LDL 60  GI- Divertics, Polyps, Hx ischemic bowel> the latter was due to an internal hernia & closed loop obstruction & not from any thrombotic or embolic phenomenon; he has done well since the surgery...    GU- prostate cancer> followed by DrWrenn & he elected XRT- admin by DrKinard in 2009; he has a slowly rising PSa w/ a doubling time of 6-21mo he is considerinjg androgen ablation if necessary...    DJD> on Oxy-IR '5mg'$  prn; he is s/p bilat TKRs by DrOlin    Hx Anemia> on Fe &VI- Hg increased from 9.5 post op to 13.2 w/ Fe=70 (21% sat) in Oct2013... We reviewed prob list, meds, xrays and labs> see below for updates >>  CXR 1/14 showed cardiomegaly, aortic stent stable, clear lungs w/ basilar scarring, NAD...   ~  Feb 04, 2013:  471moOV & GeNazareths awaiting a [facility] in his planned thoracoabd aneurysm repair- he had his situation reviewed by DrBrabham here & [facility] since this needs to be done endovascularly & is quite complicated... We reviewed the following medical problems during today's office visit >>     COPD, HxPneumonia> on Advair250, Spiriva, Mucinex; last CT 12/12 w/ severe emphysema & scarring, plus  his extensive atherosclerotic dis/ ectatic thorAo/ aneurysm/ etc; his breathing is stable- mild cough, min sput, no hemoptysis, stable DOE, etc...     HBP, CHF, AFib w/ hx clot in LA appendage- resolved on Coumadin> on ASA81, Aten25, Lasix40, K10Bid; BP= 114/72 & he denies CP, palpit etc; followed by DrCherly Hensenor Cards- his notes are reviewed, stable- no changes made...    Peripheral Vasc Dis> on ASA81 & Coumadin; [facility] by DrMarathonhe had a Thoracoabd Ao Aneurysm from the subclavian down to the prev Ao-bi-iliac graft, they decided on a fenestrated endovascular repair & a staged procedure- 4/13 they did the Thoracic endovasc aneurysm repair & he developed mult post op complications (see above 03/22/12 entry)... DrFarbers note indicates that to finish the repair he will require 3 branches and a left renal fenestration, they quoted him a risk of paraplegia in the 10-15% range & he will need preadmission for a lumbar drain to be placed, etc; [facility]    Chol> on Lip10; last FLP 12/12 showed TChol 107, TG 38, HDL 40, LDL 60    Hypothyroid> we have been following his TSH levels and he has been consistently 6-7 range; Labs 5/14 showed TSH=6.21 & we decided to start SYNTHROID5063md     GI- Divertics, Polyps, Hx ischemic bowel> the latter was due to an internal hernia & closed loop obstruction & not from any thrombotic or embolic phenomenon; he has done well since the surgery 7/13...    GU- BPH, hx prostate cancer, Renal insuffic> on Flomax0.4; followed by DrWrenn & he elected XRT- admin by DrKinard in 2009; he has a slowly rising PSA w/ a doubling time of 6-43mo46mo is considerinjg androgen ablation if necessary, Cr=1.5...  Marland KitchenMarland KitchenDJD> on Oxy-IR '5mg'$  prn; he is s/p bilat TKRs by DrOlin...    Hx Anemia> prev on Fe; Hg increased from 9.5 post op to 13.2 w/ Fe=70 (21% sat) in Oct2013... We reviewed prob list, meds, xrays and labs> see below for  updates >>  LABS 5/14:  Chems- ok w/ stable mild RI (Cr=1.5);  CBC- wnl w/ Hg=14.2;  TSH=6.21 (Synthroid50 started);  BNP=263;  Protimes adjusted by the CC...  03/25/2013 Acute OV  Complains of  coughing up bright red blood x friday morning. Usually happens early morning and occasionally will be during the day. Has been pea size amount of blood but this morning it was size of nickel. Pt c/o chills but no fever.  No chest pain, syncope, dizziness, fever, orthopnea or abd pain.  Stopped coumadin 4 days ago, last INR 2.0 2 weeks ago  . No syncope, dizziness , leg swelling, abd pain, nv/  Good appetite.  Has planned surgery at [facility] on 7/23 w/ plans for lovenox conversion 1 week prior to surgery.          Problem List:  COPD (ICD-496) Hx of PNEUMONIA, ORGANISM UNSPECIFIED (ICD-486) PULMONARY NODULE & CAVITY RLL (ICD-518.89) - he is an ex-smoker, having quit in [place] after 40 yrs of smoking... he was exercising regularly and walking daily ~40m 5-6 days per week... on AMekoryukdaily, +MUCINEX 2Bid w/ Fluids... ~  baseline CXR & CTChest w/ marked emphysema, atheromatous calcif, 5cm desc thorAA...  ~  CXR & CTA 2/11 in hosp showed cardiomeg + coronary calcif, advanced atherosclerosis of Ao w/ aneuryms 5.8cm into abd, underlying emphysema w/ interst edema & bilat effusions, no PE... ~  4/11:  RLL pneumonia which was slow to clear... ~  6/11:  f/u CXR & CT Chest 6/11 w/ 6cm cavitary area RLL & nodular component inferiorly, +hilar adenopathy, no mediastinal nodes, etc; Tumor markers showed CEA=7.5 & Ca19-9= 10.8; > IR felt lesion was too hi risk for bx, therefore contin Rx & observe. ~  8/11:  f/u CXR w/o change in RLL opac (no worsening)>> repeat CEA=6.5; plan f/u CT Chest in 233mo~  10/11: f/u CT Chest showed interval decrease in size of RLL cavitary lesion & the inferiorly placed soft tissue component; otherw there is severe emphysema, cardiomeg, coronary calcif, thoracoabd aneurysm w/o  change; there was an air-fluid level in a dilated esoph> check Ba Esophagram= Nonspecific esophageal motility disorder with very poor primary esophageal contractions (no HH, stricture, etc)... ~  12/12: f/u CTChest showed severe emphysema & scarring w/ bibasilar atx (no infiltrates no cavities), markedly tortuous & ectaticThorAo w/ extensive atherosclerotic changes & no change in decr thor aneurysm measuring 5.9 x 5.6cm, mild cardiomeg & dense coronary calcif as well... ~  4/13:  He had a fenestrated endovasc repair of [facility]abdominal [facility] ~  subseq CXRs thru 7/13 hosp w/ cardiomeg, low lung vols, bibasilar airsp dis, etc... ~  CXR 1/14 showed cardiomeg, bibasilar fibrosis, thoracic aortic stent noted, NAD...   HYPERTENSION (ICD-401.9)                         << followed by DrCherly Hensenor Cards >> CONGESTIVE HEART FAILURE (ICD-428.0) ATRIAL FIBRILLATION (ICD-427.31) w/ clot in left atrial appendage >> resolved on f/u TEE, on COUMADIN per Cards & cardioverted. PREMATURE VENTRICULAR CONTRACTIONS, FREQUENT (ICD-427.69)  MEDS> prev on ATENOLOL '25mg'$ /d, & LASIX '20mg'$ -2AM + KCl 1051mid, and off Losartan... prev Norvasc discontinued & hx ACE cough in the past on Lisinopril. ~  NuclearStressTest 1/05 was neg- no ischemia or infarct (+diaphrag attenuation), EF=56%... ~  repeat Nuclear study 6/09 was neg- no ischemia, mild inferoapic thinning, not gated due to PVCs... ~  2DEcho 6/09 showed mild dilated LV w/ EF= 45-50% but no regional wall motion abn, mild AoV calcif & AI, LA mild dil... ~  12/10:  changed from [place] to [place] to save $$ ~  2/11:  in hosp- Cath= mild nonobstructive 3 vessel CAD, mod LVD w/ EF=40-45%; TEE= CHF w/ EF~45% w/ HK & left atrial appendage clot & AFib; he was diuresed & Norvasc stopped, placed on Coumadin w/ careful f/u by Cherly Hensen & the CC... ~  Subseq successful [facility] & holding NSR w/ PVCs... ~  Recurrent AFib w/ RVR after his Thoracoabdominal stent graft  4/13... ~  6/13:  BP was low assoc w/ GU bleeding & ATENOLOL25 & LOSARTAN50 placed on HOLD... ~  COUMADIN on HOLD since GU bleeding & acute blood loss anemia; it has not yet been restarted... ~  8/13:  BP= 116/78 on LASIX '40mg'$ /d & K10-Bid;  Wt down 14# to 173# ~  10/13:  BP= 114/82 on Lasix40+K1-Bid (he takes Aten25 if BP>110 at home)... ~  12/13: he had Cards f/u DrNishan> note reviewed, no change in meds, he referred him to DrGearhart for f/u stent as he did not want to ret to [facility]... ~  5/14: HBP, CHF, AFib w/ hx clot in LA appendage- resolved on Coumadin> on ASA81, Aten25, Lasix40, K10Bid; BP= 114/72 & he denies CP, palpit etc; followed by Cherly Hensen for Cards- his notes are reviewed, stable- no changes made.  PERIPHERAL VASCULAR DISEASE (ICD-443.9) - back on ASA '81mg'$ /d... he is s/p infrarenal AAA repair w/ right common iliac aneurysm repair (via Ao Bi-iliac graft) in 1998 by DrLawson... also has a known desc thor AA measuring ~5+cm and followed by DrGearhart...  ~  seen by DrGearhart 4/10 w/ CT scan showing ~5cm thoracoabdominal aneurysm w/o signif change...  ~  seen by DrGearhart 2/11 in hosp w/ sl incr size of AAA- f/u planned 25mo ~  also followed by [facility]- DrCooper. ~  seen by DrGearhardt 10/11 & stable, no change, f/u 1 yr. ~  Seen 12/12 by DrGearhardt & CTscan is stable, continue conservative approach... ~  2/13:  [facility]... ~  4/13:  He had a fenestrated [facility]asc [facility] by lumbar epidural hematomas (on Coumadin) w/ paraplegia requiring readmission for laminectomy & evac of the hematomas;  [facility] but only spent 4d there [facility] w/ altered mental status, pneumonia, AFib w/ rvr, & FTT> Neuro w/u was otherw neg (& he improved w/ supportive care)... ~  2/14:  CT Chest,  Abdomen, Pelvis> per DrBrabham - see report (the abdominal component has enlarged)... ~  [facility]...  HYPERCHOLESTEROLEMIA (ICD-272.0) - on LIPITOR '10mg'$ /d...  ~  FStearns5/07 showed TChol 115, Tg 58, HDL 36, LDL 67 ~  FLP 5/08 showed TChol 118, TG 66, HDL 31, LDL 74 ~  FLP 5/09 showed TChol 126, TG 71, HDL 37, LDL 75 ~  FLP 6/10 showed TChol 116, TG 73, HDL 40, LDL 62 ~  2/11:  FLP not checked during the hospitalization... ~  FLP 4/11 in hosp showed TChol 94, TG 52, HDL 23, LDL 61 ~  FLP 12/12 on Lip10 showed TChol 107, TG 38, HDL 40, LDL 60  HYPOTHYROIDISM >> he remains clinically euthyroid... ~  Labs 12/12 showed TSH= 6.10 ~  Labs 6/13 showed TSH= 6.66 ~  Labs 5/14 showed TSH=  6.21... We decided to start SYNTHROID 53mg/d...  DIVERTICULOSIS OF COLON (ICD-562.10) & COLONIC POLYPS (ICD-211.3) - hx polyps in 2003 = tubular adenoma... ~  last colonoscopy 9/06 showed divertics, hems, no polyps.  ISCHEMIC BOWEL >> GFleetpresented to the ER 7/23 - 04/25/12 w/ abd pain- ELap surg revealed ~5 feet of frankly ischemic small bowel, likely due to internal hernia and closed loop obstruction=> Bowel resected & pulses intact w/ no evidence of embolic or thrombotic pathology;  Viable small bowel anastomosed & wound vac applied; he had a stable post op course...  PROSTATE CANCER (ICD-185) - eval by DrWrenn and they decided on XRT by DrKinard- finished 5/09 and he states that DrKinard "released me"... he sees DrWrenn every 6 months & they follow PSA closely (notes reviewed)... ~  8/12: f/u prostate cancer dx in 2009 & treated w/ XRT completed 6/09; slowly rising PSA w/ doubling time 6-942momin voiding symptoms, he is considering androgen ablation if needed... ~  6/13: He developed hematuria & urinary retention requiring Urology eval, discontinuation of Coumadin, & Foley placement=> subseq removed 8/13 w/ adeq voiding trial... ~  3/14:  He had f/u visit  w/ DrWrenn- PSA nadir after XRT was <0.04 and it has climbed to 0.54 (3/14) w/ PSADT of ~1y76yrhey chose to continue watchful waiting...  DEGENERATIVE JOINT DISEASE (ICD-715.90) - s/p bilat TKR's, [facility]- DrOlin... He has OXY-IR '5mg'$  as needed for pain...  ACTINIC SKIN DAMAGE (ICD-692.70) - followed by DrHBrunetta Jeanse knows to avoid sun exposure, use sun screen, etc...  ANEMIA >> ~  Labs 6/13 showed Hg= 8.4, Fe= 31 (11%sat); Rec to start FeSO4 Bid... ~  Labs 7/13 in hosp showed Hg= 12.6=>8.2 at disch... ~  Labs 8/13 showed Hg= 9.5, MCV= 94; rec to continue FeSO4 Bid... ~  Labs 10/13 showed Hg= 13.2 & ok to wean Fe to 1/d til gone then stop... ~  Labs 5/14 showed Hg= 14.2   Past Surgical History  Procedure Laterality Date  . Total knee arthroplasty      bilateral  . Abdominal aortic aneurysm repair  1998  . Appendectomy    . Inguinal hernia repair      left  . Joint replacement    . Laparotomy  04/16/2012    Procedure: EXPLORATORY LAPAROTOMY;  Surgeon: BriMadilyn HookO;  [facility] [place];  Laterality: N/A;  exploratory Laparotomy,Small bowel ressection abdominal wound vac placement.  . Laparotomy  04/18/2012    Procedure: EXPLORATORY LAPAROTOMY;  Surgeon: ThoJoyice Fasterornett, MD;  [facility] [place];  Laterality: N/A;  exploratory laparotomy with small bowel anastomosis  . Application of wound vac  04/18/2012    Procedure: APPLICATION OF WOUND VAC;  Surgeon: ThoJoyice Fasterornett, MD;  [facility] [place];  Laterality: N/A;  Removal of abdominal wound vac, Application of incisional  wound vac    Outpatient Encounter Prescriptions as of 03/25/2013  Medication Sig Dispense Refill  . ADVAIR DISKUS 250-50 MCG/DOSE AEPB inhale 1 dose by mouth twice a day  60 each  6  . aspirin 81 MG tablet Take 81 mg by mouth daily.       . aMarland Kitchenenolol (TENORMIN) 25 MG tablet Take 25 mg by mouth daily. As need-only takes if BP is high      . atorvastatin (LIPITOR) 10  MG tablet Take 1 tablet (10 mg total) by mouth daily.  90 tablet  3  . furosemide (LASIX) 40 MG tablet Take  1 tablet (40 mg total) by mouth daily.  30 tablet  5  . levothyroxine (SYNTHROID, LEVOTHROID) 50 MCG tablet Take 1 tablet (50 mcg total) by mouth daily before breakfast.  90 tablet  3  . Multiple Vitamin (MULTIVITAMIN) tablet Take 1 tablet by mouth daily.        [name] oxyCODONE (OXY IR/ROXICODONE) 5 MG immediate release tablet As needed for pain      . potassium chloride (K-DUR) 10 MEQ tablet Take 1 tablet (10 mEq total) by mouth 2 (two) times daily.  180 tablet  3  . Tamsulosin HCl (FLOMAX) 0.4 MG CAPS Take 0.4 mg by mouth at bedtime.       [name] tiotropium (SPIRIVA HANDIHALER) 18 MCG inhalation capsule Place 1 capsule (18 mcg total) into inhaler and inhale daily.  90 capsule  3  . guaiFENesin (MUCINEX) 600 MG 12 hr tablet Take 600 mg by mouth 2 (two) times daily. COPD.      [name] warfarin (COUMADIN) 2.5 MG tablet Take 1 tablet (2.5 mg total) by mouth as directed.  90 tablet  1   No facility-administered encounter medications on file as of 03/25/2013.    No Known Allergies   Current Medications, Allergies, Past Medical History, Past Surgical History, Family History, and Social History were reviewed in [employer] record.   Review of Systems        See HPI - all other systems neg except as noted...  The patient complains of dyspnea on exertion.  The patient denies anorexia, fever, weight loss, weight gain, vision loss, decreased hearing, hoarseness, chest pain, syncope, peripheral edema, prolonged cough, headaches, hemoptysis, abdominal pain, melena, hematochezia, severe indigestion/heartburn, hematuria, incontinence, muscle weakness, suspicious skin lesions, transient blindness, difficulty walking, depression, unusual weight change, abnormal bleeding, enlarged lymph nodes, and angioedema.     Objective:   Physical Exam    WD, WN, 79 y/o WM in NAD... GENERAL:  Alert &  oriented; pleasant & cooperative; pale complexion... HEENT:  [place]/AT, EOM-wnl, PERRLA, EACs-clear, TMs-wnl, NOSE-clear, THROAT-clear & wnl. NECK:  Supple w/ fairROM; no JVD; normal carotid impulses w/o bruits; no thyromegaly or nodules palpated; no lymphadenopathy. CHEST:  decr BS bilat, scat bibasilar rales, w/o wheezing/ rhonchi/ signs of consolidation. HEART:  irregular rhythm, gr1/6 diast murmur in Ao area, without rubs or gallops detected... ABDOMEN:  Soft & nontender; normal bowel sounds; no organomegaly or masses palpated... EXT: s/p bilat TKR's, mod arthritic changes; no varicose veins/ +venous insuffic/ no edema. NEURO:  CN's intact;  gait abn; no focal neuro deficits... DERM:  No lesions noted; no rash etc...     Assessment:

## 2013-03-26 LAB — CBC WITH DIFFERENTIAL/PLATELET
Basophils Absolute: 0 10*3/uL (ref 0.0–0.1)
Basophils Relative: 0 % (ref 0–1)
Eosinophils Absolute: 0.1 10*3/uL (ref 0.0–0.7)
Eosinophils Relative: 2 % (ref 0–5)
HCT: 40.7 % (ref 39.0–52.0)
Hemoglobin: 13.9 g/dL (ref 13.0–17.0)
Lymphocytes Relative: 11 % — ABNORMAL LOW (ref 12–46)
Lymphs Abs: 0.9 10*3/uL (ref 0.7–4.0)
MCH: 31.2 pg (ref 26.0–34.0)
MCHC: 34.2 g/dL (ref 30.0–36.0)
MCV: 91.3 fL (ref 78.0–100.0)
Monocytes Absolute: 1 10*3/uL (ref 0.1–1.0)
Monocytes Relative: 12 % (ref 3–12)
Neutro Abs: 6 10*3/uL (ref 1.7–7.7)
Neutrophils Relative %: 75 % (ref 43–77)
Platelets: 146 10*3/uL — ABNORMAL LOW (ref 150–400)
RBC: 4.46 MIL/uL (ref 4.22–5.81)
RDW: 14 % (ref 11.5–15.5)
WBC: 8.1 10*3/uL (ref 4.0–10.5)

## 2013-03-26 LAB — PROTIME-INR
INR: 1.43 (ref <1.50)
Prothrombin Time: 17.2 seconds — ABNORMAL HIGH (ref 11.6–15.2)

## 2013-03-27 ENCOUNTER — Ambulatory Visit (INDEPENDENT_AMBULATORY_CARE_PROVIDER_SITE_OTHER): Payer: Medicare Other | Admitting: *Deleted

## 2013-03-27 DIAGNOSIS — I4891 Unspecified atrial fibrillation: Secondary | ICD-10-CM

## 2013-03-27 LAB — POCT INR: INR: 1.4

## 2013-04-03 ENCOUNTER — Ambulatory Visit (INDEPENDENT_AMBULATORY_CARE_PROVIDER_SITE_OTHER)
Admission: RE | Admit: 2013-04-03 | Discharge: 2013-04-03 | Disposition: A | Discharge status: Home / Self Care | Payer: Medicare Other | Source: Ambulatory Visit | Attending: Adult Health | Admitting: Adult Health

## 2013-04-03 ENCOUNTER — Ambulatory Visit (INDEPENDENT_AMBULATORY_CARE_PROVIDER_SITE_OTHER): Payer: Medicare Other | Admitting: *Deleted

## 2013-04-03 ENCOUNTER — Ambulatory Visit (INDEPENDENT_AMBULATORY_CARE_PROVIDER_SITE_OTHER): Payer: Medicare Other | Admitting: Adult Health

## 2013-04-03 ENCOUNTER — Encounter: Payer: Self-pay | Admitting: Adult Health

## 2013-04-03 VITALS — BP 122/84 | HR 67 | Temp 97.6°F | Ht 69.0 in | Wt 181.6 lb

## 2013-04-03 DIAGNOSIS — J189 Pneumonia, unspecified organism: Secondary | ICD-10-CM

## 2013-04-03 DIAGNOSIS — I4891 Unspecified atrial fibrillation: Secondary | ICD-10-CM

## 2013-04-03 LAB — POCT INR: INR: 2

## 2013-04-03 NOTE — Assessment & Plan Note (Addendum)
Clinically improving. CXR today showed improved aeration . Hemoptysis resolved.  No further abx indicated.  Repeat cxr on return to document clearance    Plan  Follow up with coumadin clinic today as planned  Mucinex DM Twice daily  As needed  Cough/congestion  Fluids and rest   follow up Dr. Kriste Basque  With chest xray in 6 weeks and As needed     Please contact office for sooner follow up if symptoms do not improve or worsen or seek emergency care

## 2013-04-03 NOTE — Patient Instructions (Addendum)
Follow up with coumadin clinic today as planned  Mucinex DM Twice daily  As needed  Cough/congestion  Fluids and rest   follow up Dr. Kriste Basque  With chest xray in 6 weeks and As needed     Please contact office for sooner follow up if symptoms do not improve or worsen or seek emergency care

## 2013-04-03 NOTE — Progress Notes (Signed)
Subjective:     Patient ID: Ryan Hess, male   DOB: August 24, 1924, 79 y.o.   MRN: [id number]  HPI 79 y/o ~  March 22, 2012:  65moROV & GJoshuahhas been through a lot >> In Feb2013 he was [facility]- starting 5cm distal to the left subclavian down to the prox aspect of there prev [name] graft;  They chose to proceed w/ a fenestrated endovasc repair> done 01/01/12;  Disch on Coumadin for his AFib & Lovenox bridge but he developed a complic w/ lumbar epidural hematomas w/ paraplegia requiring readmission for laminectomy & evac of the hematomas;  [facility] and then [facility]ch [facility] SNF ([facility] but only spent 4d there before [facility] w/ altered mental status, pneumonia, AFib w/ rvr, & FTT> Neuro w/u was otherw neg (& he improved w/ supportive care); CXR showed L>R bibasilar airsp dis (covered w/ IV antibiotics & disch on Avelox); AFib was rate controlled w/ med adjustments & Coumadin continued; Urinary retension required foley cath & Urology outpt management; his protimes got too thin & he developed hematuria & acute blood loss anemia; now BP has been low necessitating a HOLD on his BP med...  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  Bilat lower lobe pneumonias treated in-pt 5/13 and disch on Avelox > He is afeb & starting to feel better, actually looks pretty good (just pale); CXR today showed resolution of infiltrates...  AFib w/ RVR> meds adjusted and Coumadin carefully monitored; Heart rate= 62 off Atenolol; Protime=13.6/1.2 Off Coumadin x several days now; decision will need to be made regarding restart coumadin when  safe...  Urinary retension from the lumbar surg and hematuria developing while on coumadin > he has foley cath & leg bag w/ clear yellow urine at present; appt to see DrWrenn in 3d to consider further options (?voiding trial, in&out cath, etc)...  Hypotension w/ weakness & required down titration in BP meds & careful monitoring of BP etc > currently on Lasix40, K10Bid; BP= 118/66 and has varied 90's/70's to 120's/80's at home OFF his prev Aten25 & Losartan50;  Chem's are WNL & BNP=552...  Anemia > He developed acute blood loss anemia w/ post op Hg= 11-12 but dropped to 8.6 w/ hematuria & INR=4.2; Coumadin & ASA placed on HOLD; f/u labs showed Hg=8.4, Fe=31 (11%sat);  Rec to start FeSO4 Bid now...  Complaints of constipation > we reviewed the need for MIRALAX 17gm in water daily, and SENAKOT-S 2tabs at bedtime... We reviewed prob list, meds, xrays and labs> CXR 6/13 showed stable cardiac silhouette, sl incr markings chronically, NAD, no edema, Ao stent in desc Ao appears stable... LABS 6/13:  Chems- ok w/ BUN=22 Creat=1.4 BNP=552;  CBC- Hg=8.4 Fe=31(11%sat) B12=953;  Protime=13.6/ INR1.2;  TSH=6.66  ~  May 03, 2012:  6wk ROV & post hosp follow up> GZenaspresented to the ER 7/23 - 04/25/12 w/ abd pain- ELap surg revealed ~5 feet of frankly ischemic small bowel, likely due to internal hernia and closed loop obstruction=> Bowel resected & pulses intact w/ no evidence of embolic or thrombotic pathology;  Viable small bowel anastomosed & wound vac  applied; he had a stable post op course=> foley left in place;  Disch 8/1 w/ home health etc...    Since disch we had call from son regarding pts Cardiac meds and BP> when last seen his BP was low & he was weak- Aten & Losartan placed on hold, continued on Lasix40, he's been off his Coumadin since 6/13 w/ hematuria/ anemia=> hasn't yet been restarted & only covered transiently in hosp w/ Lovenox, disch on ASA81, no coumadin;  BP since disch was still low (95/70)  so Aten, Losartan kept on [place] continued...     He is pale & weak but gaining strength he says;  He saw Urology 8/8 w/ adeq voiding trial & Foley removed, voiding satis so far;  Since disch he's noted some pitting edema in LEs and L>R arm swelling (he had left IJ line in hosp)=> we decided to check VenDopplers ASAP & prelim report indicates no subclavian clots;  Needs Cards follow up & decision regarding restart of Coumadin w/ careful protime monitoring...  We reviewed prob list, meds, xrays and labs >> LABS 7/13 in hosp near disch> Hg=8.2, MCV=93, BS=ok, K=3.3, Creat=0.85, Alb=3.2, LFTs=wnl... CXR 7/13 showed cardiomeg, intubated, low lung vols & bilat LL airsp dis L>R... 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  ~  May 23, 2012:  3wk ROV & Ryan Hess's swelling is diminished & his weight is down 13# to 173# today on Lasix40 & K10Bid;  BP= 112/70 & his strength is sl better w/ Losartan50 on HOLD & son is giving him Aten25 only if BP>110 sys... NOTE> he has restarted the ASA'81mg'$ /d but his COUMADIN is still on hold & pending Cardiology appt next week (Off Coumadin since 6/13 due to hematuria, then abd pain/ ELap, etc; he has had several coumadin complications)...    We reviewed prob list, meds, xrays and labs> see below for updates >>  ~  July 08, 2012:  6wk ROV & Ryan Hess is stable, slowly gaining strength, wt up 3# to 176# today, BP well regulated on his Lasix40 & takes the Aten25 only if BP>110;  Breathing stable on Advair250 + Spiriva & he uses the Mucinex as needed;  Due for f/u CBC & Fe on his FeSO4 '325mg'$ Bid...    We reviewed prob list, meds, xrays and labs> see below for updates >> OK Flu vaccine today... LABS 10/13:  Chems- wnl;  CBC- improved w/ Hg=13.2 & Fe looks good etc...  ~  October 07, 2012:  24moROV & Ryan Hess much better, stable, just c/o some back  discomfort;  We reviewed the following medical problems during today's office visit >>     COPD, HxPneumonia> on Advair250, Spiriva, Mucinex; last CT 12/12 w/ severe emphysema & scarring, plus his extensive atherosclerotic dis/ ectatic thorAo/ aneurysm/ etc; his breathing is stable- mild cough, min sput, no hemoptysis, stable DOE, etc...     HBP, CHF, AFib w/ hx clot in LA appendage- resolved on Coumadin> on ASA81, Aten25, Lasix40, K10Bid; followed by DBurkina Fasofor Cards- seen 12/13 & his note is reviewed, no changes made...    Peripheral Vasc Dis> on ASA81 & Coumadin; followed by DrGearhardt for [facility]ascSurg  [facility]see extensive hx below)...    Chol> on Lip10; last FLP 12/12 showed TChol 107, TG 38, HDL 40, LDL 60  GI- Divertics, Polyps, Hx ischemic bowel> the latter was due to an internal hernia & closed loop obstruction & not from any thrombotic or embolic phenomenon; he has done well since the surgery...    GU- prostate cancer> followed by DrWrenn & he elected XRT- admin by DrKinard in 2009; he has a slowly rising PSa w/ a doubling time of 6-44mo he is considerinjg androgen ablation if necessary...    DJD> on Oxy-IR '5mg'$  prn; he is s/p bilat TKRs by DrOlin    Hx Anemia> on Fe &VI- Hg increased from 9.5 post op to 13.2 w/ Fe=70 (21% sat) in Oct2013... We reviewed prob list, meds, xrays and labs> see below for updates >>  CXR 1/14 showed cardiomegaly, aortic stent stable, clear lungs w/ basilar scarring, NAD...   ~  Feb 04, 2013:  4659moOV & Ryan Hess [facility] in his planned thoracoabd aneurysm repair- he had his situation reviewed by DrBrabham here & [facility] since this needs to be done endovascularly & is quite complicated... We reviewed the following medical problems during today's office visit >>     COPD, HxPneumonia> on Advair250, Spiriva, Mucinex; last CT 12/12 w/ severe emphysema & scarring, plus  his extensive atherosclerotic dis/ ectatic thorAo/ aneurysm/ etc; his breathing is stable- mild cough, min sput, no hemoptysis, stable DOE, etc...     HBP, CHF, AFib w/ hx clot in LA appendage- resolved on Coumadin> on ASA81, Aten25, Lasix40, K10Bid; BP= 114/72 & he denies CP, palpit etc; followed by DrCherly Hensenor Cards- his notes are reviewed, stable- no changes made...    Peripheral Vasc Dis> on ASA81 & Coumadin; [facility] AAA repair by DrCarneyhe had a Thoracoabd Ao Aneurysm from the subclavian down to the prev Ao-bi-iliac graft, they decided on a fenestrated endovascular repair & a staged procedure- 4/13 they did the Thoracic endovasc aneurysm repair & he developed mult post op complications (see above 03/22/12 entry)... DrFarbers note indicates that to finish the repair he will require 3 branches and a left renal fenestration, they quoted him a risk of paraplegia in the 10-15% range & he will need preadmission for a lumbar drain to be placed, etc; [facility]    Chol> on Lip10; last FLP 12/12 showed TChol 107, TG 38, HDL 40, LDL 60    Hypothyroid> we have been following his TSH levels and he has been consistently 6-7 range; Labs 5/14 showed TSH=6.21 & we decided to start SYNTHROID5029md     GI- Divertics, Polyps, Hx ischemic bowel> the latter was due to an internal hernia & closed loop obstruction & not from any thrombotic or embolic phenomenon; he has done well since the surgery 7/13...    GU- BPH, hx prostate cancer, Renal insuffic> on Flomax0.4; followed by DrWrenn & he elected XRT- admin by DrKinard in 2009; he has a slowly rising PSA w/ a doubling time of 6-59mo27mo is considerinjg androgen ablation if necessary, Cr=1.5...  Marland KitchenMarland KitchenDJD> on Oxy-IR '5mg'$  prn; he is s/p bilat TKRs by DrOlin...    Hx Anemia> prev on Fe; Hg increased from 9.5 post op to 13.2 w/ Fe=70 (21% sat) in Oct2013... We reviewed prob list, meds, xrays and labs> see below for  updates >>  LABS 5/14:  Chems- ok w/ stable mild RI (Cr=1.5);  CBC- wnl w/ Hg=14.2;  TSH=6.21 (Synthroid50 started);  BNP=263;  Protimes adjusted by the CC...  03/25/2013 Acute OV  Complains of  coughing up bright red blood x friday morning. Usually happens early morning and occasionally will be during the day. Has been pea size amount of blood but this morning it was size of nickel. Pt c/o chills but no fever.  No chest pain, syncope, dizziness, fever, orthopnea or abd pain.  Stopped coumadin 4 days ago, last INR 2.0 2 weeks ago  . No syncope, dizziness , leg swelling, abd pain, nv/  Good appetite.  Has planned surgery at [facility] on 7/23 w/ plans for lovenox conversion 1 week prior to surgery.  >CXR showed RUL infiltrate c/w PNA , rx Avelox   04/03/2013 Follow up PNA  Pt returns for follow up . Seen 10 days ago for hemoptysis. CXR showed RUL infiltrate c/w PNA. Tx w/ Avelox x 7 d  Coumadin held briefly. Last INR 1.4.  Has follow up with coumadin clinic today.  Feels much better , hemopstysis resolved within 24hrs of starting abx. . No chest pain, orthopnea or edema. Good appetite.  CXR today shows improved aeration with decreased RUL infiltrate  Has [facility]AA surgery planned on 7[facility]           Problem List:   COPD (ICD-496) Hx of PNEUMONIA, ORGANISM UNSPECIFIED (ICD-486) PULMONARY NODULE & CAVITY RLL (ICD-518.89) - he is an ex-smoker, having quit in [place] after 40 yrs of smoking... he was exercising regularly and walking daily ~29m 5-6 days per week... on ABarcelonetadaily, +MUCINEX 2Bid w/ Fluids... ~  baseline CXR & CTChest w/ marked emphysema, atheromatous calcif, 5cm desc thorAA...  ~  CXR & CTA 2/11 in hosp showed cardiomeg + coronary calcif, advanced atherosclerosis of Ao w/ aneuryms 5.8cm into abd, underlying emphysema w/ interst edema & bilat effusions, no PE... ~  4/11:  RLL pneumonia which was slow to clear... ~  6/11:  f/u CXR & CT Chest 6/11 w/ 6cm cavitary area  RLL & nodular component inferiorly, +hilar adenopathy, no mediastinal nodes, etc; Tumor markers showed CEA=7.5 & Ca19-9= 10.8; > IR felt lesion was too hi risk for bx, therefore contin Rx & observe. ~  8/11:  f/u CXR w/o change in RLL opac (no worsening)>> repeat CEA=6.5; plan f/u CT Chest in 270mo~  10/11: f/u CT Chest showed interval decrease in size of RLL cavitary lesion & the inferiorly placed soft tissue component; otherw there is severe emphysema, cardiomeg, coronary calcif, thoracoabd aneurysm w/o change; there was an air-fluid level in a dilated esoph> check Ba Esophagram= Nonspecific esophageal motility disorder with very poor primary esophageal contractions (no HH, stricture, etc)... ~  12/12: f/u CTChest showed severe emphysema & scarring w/ bibasilar atx (no infiltrates no cavities), markedly tortuous & ectaticThorAo w/ extensive atherosclerotic changes & no change in decr thor aneurysm measuring 5.9 x 5.6cm, mild cardiomeg & dense coronary calcif as well... ~  4/13:  He had a fenestrated endovasc repair of [facility]abdominal [facility] ~  subseq CXRs thru 7/13 hosp w/ cardiomeg, low lung vols, bibasilar airsp dis, etc... ~  CXR 1/14 showed cardiomeg, bibasilar fibrosis, thoracic aortic stent noted, NAD...   HYPERTENSION (ICD-401.9)                         << followed by DrCherly Hensenor Cards >> CONGESTIVE HEART FAILURE (ICD-428.0) ATRIAL FIBRILLATION (ICD-427.31) w/ clot in left atrial appendage >>  resolved on f/u TEE, on COUMADIN per Cards & cardioverted. PREMATURE VENTRICULAR CONTRACTIONS, FREQUENT (ICD-427.69)  MEDS> prev on ATENOLOL '25mg'$ /d, & LASIX '20mg'$ -2AM + KCl 23mqBid, and off Losartan... prev Norvasc discontinued & hx ACE cough in the past on Lisinopril. ~  NuclearStressTest 1/05 was neg- no ischemia or infarct (+diaphrag attenuation), EF=56%... ~  repeat Nuclear study 6/09 was neg- no ischemia, mild inferoapic thinning, not gated due to PVCs... ~  2DEcho 6/09  showed mild dilated LV w/ EF= 45-50% but no regional wall motion abn, mild AoV calcif & AI, LA mild dil... ~  12/10:  changed from [place] to LPulpotio Bareasto save $$ ~  2/11:  in hosp- Cath= mild nonobstructive 3 vessel CAD, mod LVD w/ EF=40-45%; TEE= CHF w/ EF~45% w/ HK & left atrial appendage clot & AFib; he was diuresed & Norvasc stopped, placed on Coumadin w/ careful f/u by DCherly Hensen& the CC... ~  [facility] w/ PVCs... ~  Recurrent AFib w/ RVR after his Thoracoabdominal stent graft 4/13... ~  6/13:  BP was low assoc w/ GU bleeding & ATENOLOL25 & LOSARTAN50 placed on HOLD... ~  COUMADIN on HOLD since GU bleeding & acute blood loss anemia; it has not yet been restarted... ~  8/13:  BP= 116/78 on LASIX '40mg'$ /d & K10-Bid;  Wt down 14# to 173# ~  10/13:  BP= 114/82 on Lasix40+K1-Bid (he takes Aten25 if BP>110 at home)... ~  12/13: he had Cards f/u DrNishan> note reviewed, no change in meds, he referred him to DrGearhart for f/u stent [facility] ~  5/14: HBP, CHF, AFib w/ hx clot in LA appendage- resolved on Coumadin> on ASA81, Aten25, Lasix40, K10Bid; BP= 114/72 & he denies CP, palpit etc; followed by DCherly Hensenfor Cards- his notes are reviewed, stable- no changes made.  PERIPHERAL VASCULAR DISEASE (ICD-443.9) - back on ASA '81mg'$ /d... he is s/p infrarenal AAA repair w/ right common iliac aneurysm repair (via Ao Bi-iliac graft) in 1998 by DrLawson... also has a known desc thor AA measuring ~5+cm and followed by DrGearhart...  ~  seen by DrGearhart 4/10 w/ CT scan showing ~5cm thoracoabdominal aneurysm w/o signif change...  ~  seen by DrGearhart 2/11 in hosp w/ sl incr size of AAA- f/u planned 633mo~  also followed by [facility]- DrCooper. ~  seen by DrGearhardt 10/11 & stable, no change, f/u 1 yr. ~  Seen 12/12 by DrGearhardt & CTscan is stable, continue conservative approach... ~  2/13:  [facility]... ~  4/13:  He had a  fenestrated endovasc repair of [facility]-abdominal AA [facility] lumbar epid[facility] hemat[facility] (on Coumadin) w/ paraplegia requiring readmission for laminectomy & evac of the hematomas;  [facility] by [facility] w/ altered mental status, pneumonia, AFib w/ rvr, & FTT> Neuro w/u was otherw neg (& he improved w/ supportive care)... ~  2/14:  CT Chest, Abdomen, Pelvis> per DrBrabham - see report (the abdominal component has enlarged)... ~  [facility]neurys[facility]...  HYPERCHOLESTEROLEMIA (ICD-272.0) - on LIPITOR '10mg'$ /d...  ~  [place] showed TChol 115, Tg 58, HDL 36, LDL 67 ~  FLP 5/08 showed TChol  118, TG 66, HDL 31, LDL 74 ~  FLP 5/09 showed TChol 126, TG 71, HDL 37, LDL 75 ~  FLP 6/10 showed TChol 116, TG 73, HDL 40, LDL 62 ~  2/11:  FLP not checked during the hospitalization... ~  FLP 4/11 in hosp showed TChol 94, TG 52, HDL 23, LDL 61 ~  FLP 12/12 on Lip10 showed TChol 107, TG 38, HDL 40, LDL 60  HYPOTHYROIDISM >> he remains clinically euthyroid... ~  Labs 12/12 showed TSH= 6.10 ~  Labs 6/13 showed TSH= 6.66 ~  Labs 5/14 showed TSH= 6.21... We decided to start SYNTHROID 43mg/d...  DIVERTICULOSIS OF COLON (ICD-562.10) & COLONIC POLYPS (ICD-211.3) - hx polyps in 2003 = tubular adenoma... ~  last colonoscopy 9/06 showed divertics, hems, no polyps.  ISCHEMIC BOWEL >> GBirneypresented to the ER 7/23 - 04/25/12 w/ abd pain- ELap surg revealed ~5 feet of frankly ischemic small bowel, likely due to internal hernia and closed loop obstruction=> Bowel resected & pulses intact w/ no evidence of embolic or thrombotic pathology;  Viable small bowel anastomosed & wound vac applied; he had a stable post op course...  PROSTATE CANCER (ICD-185) - eval by DrWrenn and they decided on XRT by DrKinard-  finished 5/09 and he states that DrKinard "released me"... he sees DrWrenn every 6 months & they follow PSA closely (notes reviewed)... ~  8/12: f/u prostate cancer dx in 2009 & treated w/ XRT completed 6/09; slowly rising PSA w/ doubling time 6-950momin voiding symptoms, he is considering androgen ablation if needed... ~  6/13: He developed hematuria & urinary retention requiring Urology eval, discontinuation of Coumadin, & Foley placement=> subseq removed 8/13 w/ adeq voiding trial... ~  3/14:  He had f/u visit w/ DrWrenn- PSA nadir after XRT was <0.04 and it has climbed to 0.54 (3/14) w/ PSADT of ~1y23yrhey chose to continue watchful waiting...  DEGENERATIVE JOINT DISEASE (ICD-715.90) - s/p bilat TKR's, [facility]- DrOlin... He has OXY-IR '5mg'$  as needed for pain...  ACTINIC SKIN DAMAGE (ICD-692.70) - followed by DrHBrunetta Jeanse knows to avoid sun exposure, use sun screen, etc...  ANEMIA >> ~  Labs 6/13 showed Hg= 8.4, Fe= 31 (11%sat); Rec to start FeSO4 Bid... ~  Labs 7/13 in hosp showed Hg= 12.6=>8.2 at disch... ~  Labs 8/13 showed Hg= 9.5, MCV= 94; rec to continue FeSO4 Bid... ~  Labs 10/13 showed Hg= 13.2 & ok to wean Fe to 1/d til gone then stop... ~  Labs 5/14 showed Hg= 14.2   Past Surgical History  Procedure Laterality Date  . Total knee arthroplasty      bilateral  . Abdominal aortic aneurysm repair  1998  . Appendectomy    . Inguinal hernia repair      left  . Joint replacement    . Laparotomy  04/16/2012    Procedure: EXPLORATORY LAPAROTOMY;  Surgeon: BriMadilyn HookO;  [facility] [place] [place];  Laterality: N/A;  exploratory Laparotomy,Small bowel ressection abdominal wound vac placement.  . Laparotomy  04/18/2012    Procedure: EXPLORATORY LAPAROTOMY;  Surgeon: ThoJoyice Fasterornett, MD;  [facility] [place];  Laterality: N/A;  exploratory laparotomy with small bowel anastomosis  . Application of wound vac  04/18/2012    Procedure: APPLICATION OF WOUND  VAC;  Surgeon: ThoJoyice Fasterornett, MD;  [facility] [place] [place];  Laterality: N/A;  Removal of abdominal wound vac, Application of incisional  wound vac    Outpatient  Encounter Prescriptions as of 04/03/2013  Medication Sig Dispense Refill  . ADVAIR DISKUS 250-50 MCG/DOSE AEPB inhale 1 dose by mouth twice a day  60 each  6  . aspirin 81 MG tablet Take 81 mg by mouth daily.       [name] atenolol (TENORMIN) 25 MG tablet Take 25 mg by mouth daily. As need-only takes if BP is high      . atorvastatin (LIPITOR) 10 MG tablet Take 1 tablet (10 mg total) by mouth daily.  90 tablet  3  . furosemide (LASIX) 40 MG tablet Take 1 tablet (40 mg total) by mouth daily.  30 tablet  5  . guaiFENesin (MUCINEX) 600 MG 12 hr tablet Take 600 mg by mouth 2 (two) times daily. COPD.      [name] levothyroxine (SYNTHROID, LEVOTHROID) 50 MCG tablet Take 1 tablet (50 mcg total) by mouth daily before breakfast.  90 tablet  3  . moxifloxacin (AVELOX) 400 MG tablet Take 1 tablet (400 mg total) by mouth daily.  7 tablet  0  . Multiple Vitamin (MULTIVITAMIN) tablet Take 1 tablet by mouth daily.        [name] oxyCODONE (OXY IR/ROXICODONE) 5 MG immediate release tablet As needed for pain      . potassium chloride (K-DUR) 10 MEQ tablet Take 1 tablet (10 mEq total) by mouth 2 (two) times daily.  180 tablet  3  . Tamsulosin HCl (FLOMAX) 0.4 MG CAPS Take 0.4 mg by mouth at bedtime.       [name] tiotropium (SPIRIVA HANDIHALER) 18 MCG inhalation capsule Place 1 capsule (18 mcg total) into inhaler and inhale daily.  90 capsule  3  . warfarin (COUMADIN) 2.5 MG tablet Take 1 tablet (2.5 mg total) by mouth as directed.  90 tablet  1   No facility-administered encounter medications on file as of 04/03/2013.    No Known Allergies   Current Medications, Allergies, Past Medical History, Past Surgical History, Family History, and Social History were reviewed in [employer] record.   Review of Systems        See HPI - all  other systems neg except as noted...  The patient complains of dyspnea on exertion.  The patient denies anorexia, fever, weight loss, weight gain, vision loss, decreased hearing, hoarseness, chest pain, syncope, peripheral edema, prolonged cough, headaches, hemoptysis, abdominal pain, melena, hematochezia, severe indigestion/heartburn, hematuria, incontinence, muscle weakness, suspicious skin lesions, transient blindness, difficulty walking, depression, unusual weight change, abnormal bleeding, enlarged lymph nodes, and angioedema.     Objective:   Physical Exam    WD, WN, 79 y/o WM in NAD... GENERAL:  Alert & oriented; pleasant & cooperative; pale complexion... HEENT:  [place]/AT, EOM-wnl, PERRLA, EACs-clear, TMs-wnl, NOSE-clear, THROAT-clear & wnl. NECK:  Supple w/ fairROM; no JVD; normal carotid impulses w/o bruits; no thyromegaly or nodules palpated; no lymphadenopathy. CHEST:  decr BS bilat, scat bibasilar rales, w/o wheezing/ rhonchi/ signs of consolidation. HEART:  irregular rhythm, gr1/6 diast murmur in Ao area, without rubs or gallops detected... ABDOMEN:  Soft & nontender; normal bowel sounds; no organomegaly or masses palpated... EXT: s/p bilat TKR's, mod arthritic changes; no varicose veins/ +venous insuffic/ no edema. NEURO:  CN's intact;  gait abn; no focal neuro deficits... DERM:  No lesions noted; no rash etc...   CXR 04/03/2013  Improvement in patchy infiltrate right upper lobe.   Assessment:

## 2013-04-06 IMAGING — CT CT CTA ABD/PEL W/CM AND/OR W/O CM
2 of 10 series · 13 of 37 positions shown · IV contrast ([ID] [id number])
Comparison: 04/16/2012 and earlier studies

CTA CHEST
COMPARISON: None.

***ADDENDUM*** CREATED: 11/18/2012 [DATE]
CLINICAL DATA: For abdominal aneurysms, poststent graft placement,
complicated by bowel ischemia.

CT ANGIOGRAPHY CHEST, ABDOMEN AND PELVIS
TECHNIQUE: Multidetector CT imaging through the chest, abdomen and
pelvis was performed using the standard protocol during bolus
administration of intravenous contrast.  Multiplanar reconstructed
images including MIPs were obtained and reviewed to evaluate the
vascular anatomy.
Contrast: 100mL OMNIPAQUE IOHEXOL 350 MG/ML SOLN
CLINICAL DATA: CT ANGIOGRAPHY ABDOMEN AND PELVIS WITH CONTRAST AND WITHOUT
CONTRAST

[Series 5: angio · axial · 0.70mm/px · z∈[-608,-64]mm · 10 of 332 slices shown]
[im 31/332  lung]
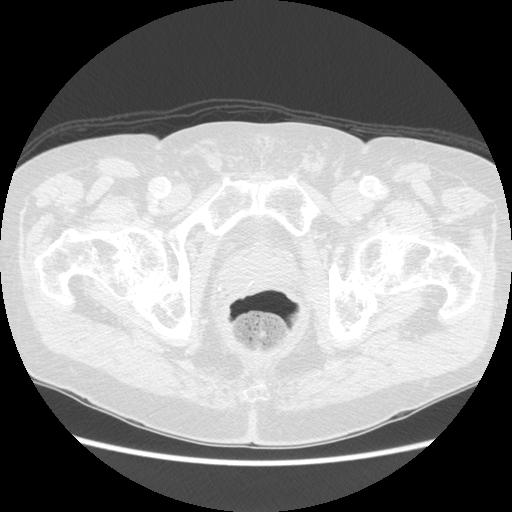
[im 61/332  mediastinal]
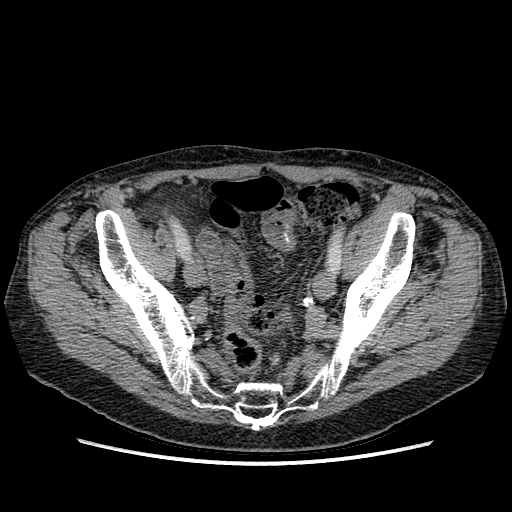
[im 91/332  lung]
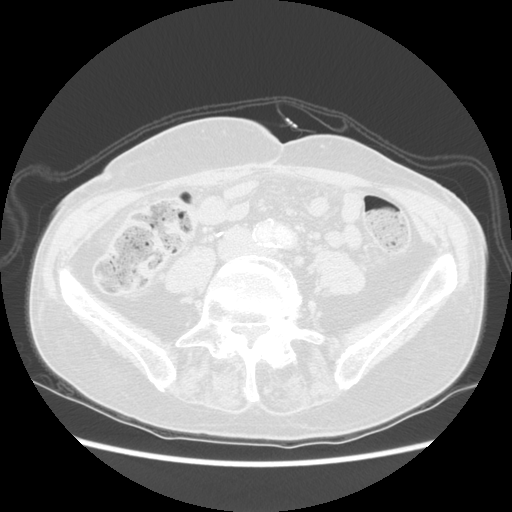
[im 121/332  mediastinal]
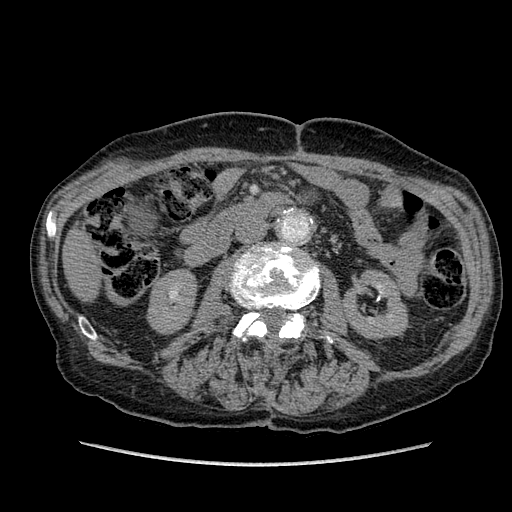
[im 151/332  lung]
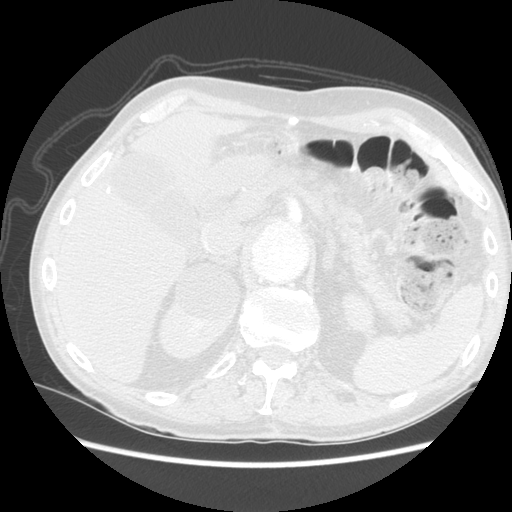
[im 181/332  mediastinal]
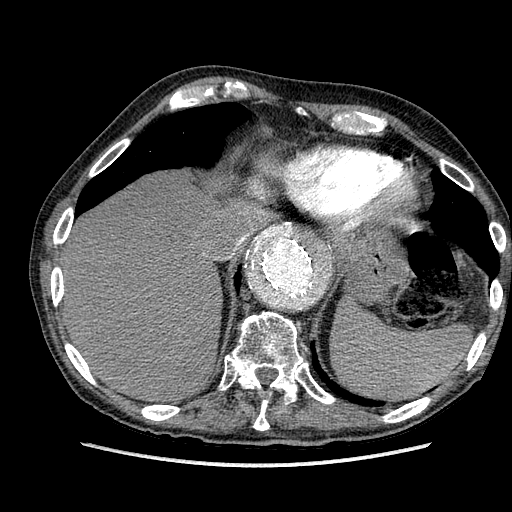
[im 211/332  lung]
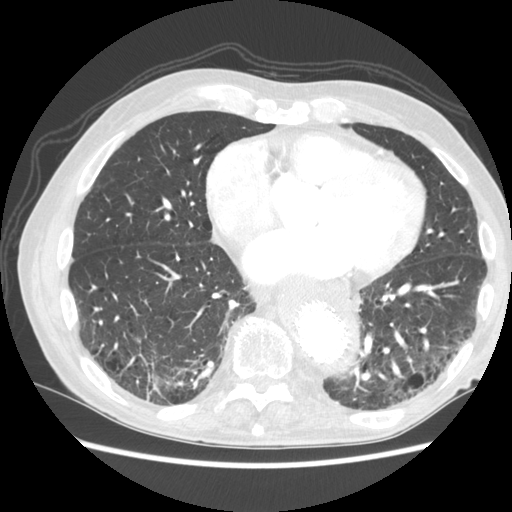
[im 241/332  mediastinal]
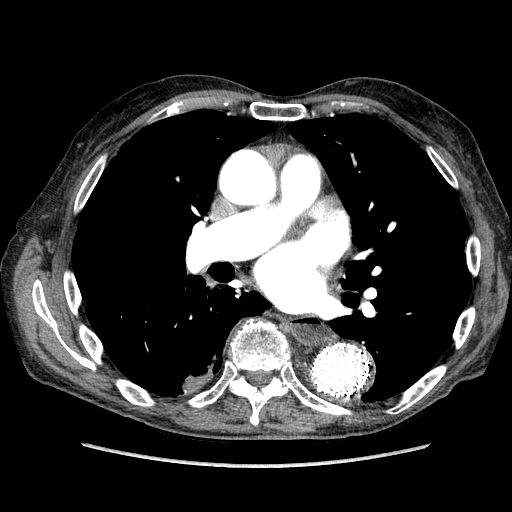
[im 271/332  lung]
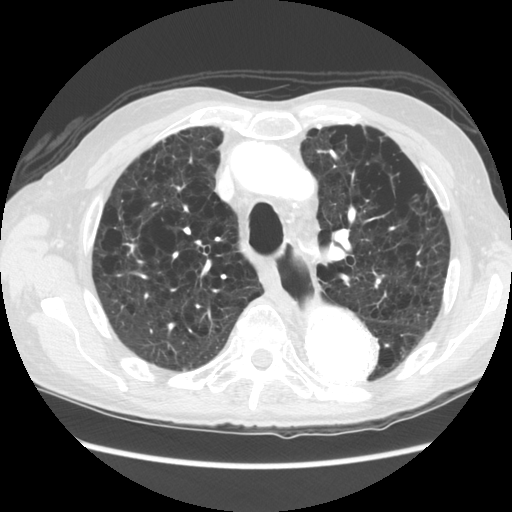
[im 301/332  mediastinal]
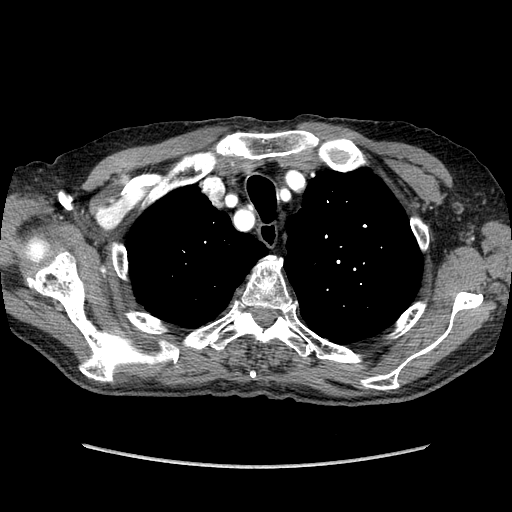

[Series 605: sagittal body · sagittal · 0.70mm/px · 3 of 145 slices shown]
[im 37/145  lung]
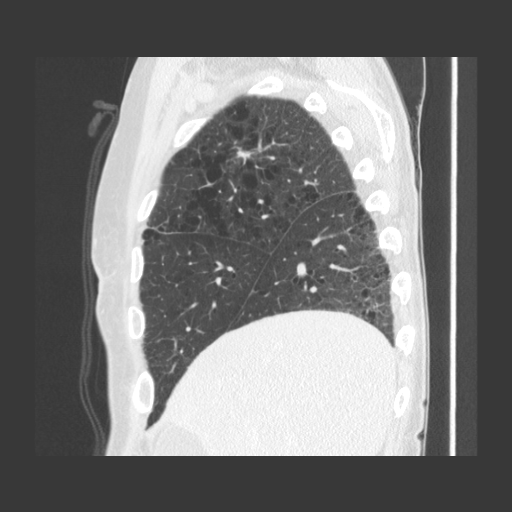
[im 73/145  lung]
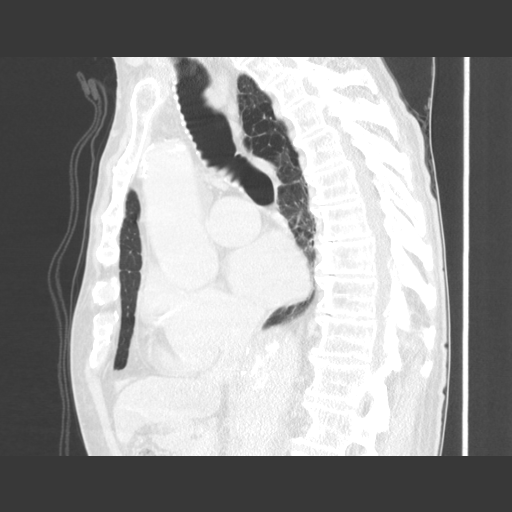
[im 109/145  lung]
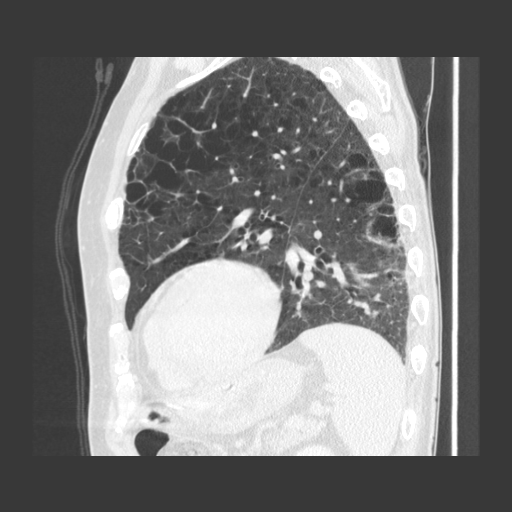

[13 of 37 positions shown; findings below may reference images not displayed]

FINDINGS: The noncontrast scan shows no hyperdense crescent,
mediastinal hematoma, pericardial or pleural effusion.  There is
fusiform aneurysm of the descending thoracic aorta, measuring
cm proximally, 5 cm distally ( previously 4.6 cm), 6.3 cm at the
diaphragm ( previously 6 cm).  There is a stent graft in the mid
and distal descending thoracic aorta.  There is incomplete
apposition of proximal and distal tines.  There is type 1 endoleak
around the distal aspect of the stent graft, and incomplete
exclusion of the distal descending thoracic aneurysm.  The stent
graft terminates in the widest section of aneurysm, just at the
diaphragm.

There is good contrast opacification of the pulmonary artery
branches; exam was not optimized for pulmonary embolus detection.

Patchy coronary arterial calcifications are evident.  No hilar or
mediastinal adenopathy.  Advanced emphysematous changes in both
lungs most marked in the upper lobes.  9 mm nodule laterally in the
right upper lobe image [DATE] stable.  Nodular pleural-based
consolidation in the superior segment right lower lobe has slightly
improved since prior exam.  No new nodule or infiltrate.

 Review of the MIP images confirms the above findings.
IMPRESSION: 1.Descending thoracic stent graft with large distal type 1 endoleak
and progressive dilatation of the distal descending thoracic aortic
aneurysm, now 6.2 cm maximum transverse diameter.

CTA ABDOMEN AND PELVIS

Arterial findings:
Aorta:                  The distal descending thoracic aortic
aneurysm extends to involve the proximal abdominal aorta, measuring
5.9 cm in its supraceliac segment, 5.1 cm suprarenal, 4.6 cm at the
level of renal arteries.  There has been repair of the infrarenal
aortic aneurysm, with the aortobiiliac graft patent.

Celiac axis:            Mild proximal narrowing at the level of
median arcuate ligament diaphragm.  Distal branching unremarkable.

Superior mesenteric:Widely patent, with classic distal branching.

Left renal:             Single.  There is atheromatous plaque
extending over the first 12 mm, with some mild poststenotic
dilatation in the distal left renal artery through to its
bifurcation.

Right renal:            Single, with mild proximal plaque, no high-
grade stenosis

Inferior mesenteric:Patent,  arising inferior to the the proximal
aortobi-iliac graft anastomosis.

Left iliac:             Left graft limb distal anastomosis to the
proximal external iliac artery widely patent.  Scattered plaque in
the external iliac and common femoral artery without stenosis.

Right iliac:            Patent right limb of the graft,
anastomosis to the proximal common iliac, native vessels to the
proximal SFA patent with scattered atheromatous plaque.

Venous findings:  Patent hepatic veins, portal vein, splenic vein,
bilateral renal veins, IVC.

 Review of the MIP images confirms the above findings.

Nonvascular findings: Stable small cyst in the lateral left hepatic
segment.  Gallbladder physiologically distended with a 3 mm
punctate calcification adjacent to the gallbladder fundus.  Stable
right renal cyst.  No hydronephrosis.  Unremarkable adrenal glands.
Mild pancreatic parenchymal atrophy.  The stomach, small bowel, and
colon are nondilated.  The bowel wall thickening seen on the prior
study has resolved. Ascites has resolved.  No free air.  Mild
diffuse wall thickening of the urinary bladder which is
incompletely distended.  Metallic seeds are markers project in the
prostate.  No adenopathy localized.  Postop and degenerative
changes in the lumbar spine.
IMPRESSION: 1. Stable extension of the descending thoracic aortic aneurysm
through the proximal abdominal aorta, without significant
enlargement of the abdominal component since previous exam.
2.  No high-grade proximal visceral or renal artery stenosis.
FINDINGS: 
IMPRESSION:

## 2013-04-07 ENCOUNTER — Telehealth: Payer: Self-pay | Admitting: Adult Health

## 2013-04-07 NOTE — Telephone Encounter (Signed)
Pt's son returning call.Ryan Hess

## 2013-04-07 NOTE — Telephone Encounter (Signed)
Pt saw TP 7.10.14 Has TAA surgery planned for 7.23.14 TP wants ov note and cxr faxed to [facility] No surgeon name on file  ATC pt at home # x2 > line is disconnected LMOM TCB x1 for pt's Concepcion Elk for this info

## 2013-04-07 NOTE — Telephone Encounter (Signed)
Spoke with pt's son [name] He states that the name of the surgeon is Dr Bennie Pierini I tried calling to get the fax number [phone]) and was placed on hold for 7 min  Will forward back to JJ to f/u on this later

## 2013-04-08 NOTE — Telephone Encounter (Signed)
Called [facility] and spoke with Lupita Leash who stated that the ov note and cxr can be faxed to Dr Pattricia Boss [facility] at [phone] or if that does not go thru, the records can be faxed to the referrals dept at [phone].  OV note and cxr printed, faxed to [facility] w/ fax cover sheet to the [facility].    Will sign off.

## 2013-04-14 HISTORY — PX: OTHER SURGICAL HISTORY: SHX169

## 2013-04-24 ENCOUNTER — Ambulatory Visit: Payer: Medicare Other | Admitting: Cardiovascular Disease

## 2013-05-01 ENCOUNTER — Ambulatory Visit (INDEPENDENT_AMBULATORY_CARE_PROVIDER_SITE_OTHER): Payer: Medicare Other

## 2013-05-01 DIAGNOSIS — I4891 Unspecified atrial fibrillation: Secondary | ICD-10-CM

## 2013-05-01 LAB — POCT INR: INR: 2.4

## 2013-05-16 ENCOUNTER — Ambulatory Visit (INDEPENDENT_AMBULATORY_CARE_PROVIDER_SITE_OTHER): Payer: Medicare Other | Admitting: *Deleted

## 2013-05-16 DIAGNOSIS — I4891 Unspecified atrial fibrillation: Secondary | ICD-10-CM

## 2013-05-16 LAB — POCT INR: INR: 3

## 2013-05-19 ENCOUNTER — Encounter: Payer: Self-pay | Admitting: Pulmonary Disease

## 2013-05-19 ENCOUNTER — Ambulatory Visit (INDEPENDENT_AMBULATORY_CARE_PROVIDER_SITE_OTHER): Payer: Medicare Other | Admitting: Pulmonary Disease

## 2013-05-19 VITALS — BP 110/64 | HR 90 | Temp 97.7°F | Ht 69.0 in | Wt 174.0 lb

## 2013-05-19 DIAGNOSIS — I5022 Chronic systolic (congestive) heart failure: Secondary | ICD-10-CM

## 2013-05-19 DIAGNOSIS — J449 Chronic obstructive pulmonary disease, unspecified: Secondary | ICD-10-CM

## 2013-05-19 DIAGNOSIS — R5381 Other malaise: Secondary | ICD-10-CM

## 2013-05-19 DIAGNOSIS — M1990 Unspecified osteoarthritis, unspecified site: Secondary | ICD-10-CM

## 2013-05-19 DIAGNOSIS — I4891 Unspecified atrial fibrillation: Secondary | ICD-10-CM

## 2013-05-19 DIAGNOSIS — D500 Iron deficiency anemia secondary to blood loss (chronic): Secondary | ICD-10-CM

## 2013-05-19 DIAGNOSIS — C61 Malignant neoplasm of prostate: Secondary | ICD-10-CM

## 2013-05-19 DIAGNOSIS — N289 Disorder of kidney and ureter, unspecified: Secondary | ICD-10-CM | POA: Insufficient documentation

## 2013-05-19 DIAGNOSIS — I10 Essential (primary) hypertension: Secondary | ICD-10-CM

## 2013-05-19 DIAGNOSIS — I739 Peripheral vascular disease, unspecified: Secondary | ICD-10-CM

## 2013-05-19 DIAGNOSIS — J4489 Other specified chronic obstructive pulmonary disease: Secondary | ICD-10-CM

## 2013-05-19 MED ORDER — TRAMADOL HCL 50 MG PO TABS: 50.0000 mg | ORAL_TABLET | Freq: Three times a day (TID) | ORAL | Status: DC | PRN

## 2013-05-19 MED ORDER — TIZANIDINE HCL 4 MG PO CAPS: 4.0000 mg | ORAL_CAPSULE | Freq: Three times a day (TID) | ORAL | Status: DC | PRN

## 2013-05-19 NOTE — Progress Notes (Signed)
Subjective:     Patient ID: Ryan Hess, male   DOB: Aug 09, 1924, 79 y.o.   MRN: [id number]  HPI 79 y/o WM here for a follow up visit... he has multiple medical problems as noted below...    ~  March 22, 2012:  2moROV & GLovelesshas been through a lot >> In Feb2013 he [facility]- starting 5cm distal to the left subclavian down to the prox aspect of there prev [name] graft;  They chose to proceed w/ a fenestrated endovasc repair> done 01/01/12;  Disch on Coumadin for his AFib & Lovenox bridge but he developed a complic w/ lumbar epidural hematomas w/ paraplegia requiring readmission for laminectomy & evac of the hematomas;  [facility] and then [facility]ch [facility] SNF ([facility] but only spent 4d [facility] w/ altered mental status, pneumonia, AFib w/ rvr, & FTT> Neuro w/u was otherw neg (& he improved w/ supportive care); CXR showed L>R bibasilar airsp dis (covered w/ IV antibiotics & disch on Avelox); AFib was rate controlled w/ med adjustments & Coumadin continued; Urinary retension required foley cath & Urology outpt management; his protimes got too thin & he developed hematuria & acute blood loss anemia; now BP has been low necessitating a HOLD on his BP med...  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  Bilat lower lobe pneumonias treated in-pt 5/13 and disch on Avelox > He is afeb & starting to feel better, actually looks pretty good (just pale); CXR today showed resolution of infiltrates...  AFib w/ RVR> meds adjusted and Coumadin carefully monitored; Heart rate= 62 off Atenolol; Protime=13.6/1.2 Off Coumadin x  several days now; decision will need to be made regarding restart coumadin when safe...  Urinary retension from the lumbar surg and hematuria developing while on coumadin > he has foley cath & leg bag w/ clear yellow urine at present; appt to see DrWrenn in 3d to consider further options (?voiding trial, in&out cath, etc)...  Hypotension w/ weakness & required down titration in BP meds & careful monitoring of BP etc > currently on Lasix40, K10Bid; BP= 118/66 and has varied 90's/70's to 120's/80's at home OFF his prev Aten25 & Losartan50;  Chem's are WNL & BNP=552...  Anemia > He developed acute blood loss anemia w/ post op Hg= 11-12 but dropped to 8.6 w/ hematuria & INR=4.2; Coumadin & ASA placed on HOLD; f/u labs showed Hg=8.4, Fe=31 (11%sat);  Rec to start FeSO4 Bid now...  Complaints of constipation > we reviewed the need for MIRALAX 17gm in water daily, and SENAKOT-S 2tabs at bedtime... We reviewed prob list, meds, xrays and labs> CXR 6/13 showed stable cardiac silhouette, sl incr markings chronically, NAD, no edema, Ao stent in desc Ao appears stable... LABS 6/13:  Chems- ok w/ BUN=22 Creat=1.4 BNP=552;  CBC- Hg=8.4 Fe=31(11%sat) B12=953;  Protime=13.6/ INR1.2;  TSH=6.66  ~  May 03, 2012:  6wk ROV & post hosp follow up> GKeltonpresented to the ER 7/23 - 04/25/12 w/ abd pain- ELap surg revealed ~5 feet of frankly ischemic small bowel, likely due to internal hernia and closed loop obstruction=> Bowel resected &  pulses intact w/ no evidence of embolic or thrombotic pathology;  Viable small bowel anastomosed & wound vac applied; he had a stable post op course=> foley left in place;  Disch 8/1 w/ home health etc...    Since disch we had call from son regarding pts Cardiac meds and BP> when last seen his BP was low & he was weak- Aten & Losartan placed on hold, continued on Lasix40, he's been off his Coumadin since 6/13 w/ hematuria/ anemia=> hasn't yet been restarted & only covered transiently in hosp  w/ Lovenox, disch on ASA81, no coumadin;  BP since disch was still low (95/70) so Aten, Losartan kept on [place] continued...     He is pale & weak but gaining strength he says;  He saw Urology 8/8 w/ adeq voiding trial & Foley removed, voiding satis so far;  Since disch he's noted some pitting edema in LEs and L>R arm swelling (he had left IJ line in hosp)=> we decided to check VenDopplers ASAP & prelim report indicates no subclavian clots;  Needs Cards follow up & decision regarding restart of Coumadin w/ careful protime monitoring...  We reviewed prob list, meds, xrays and labs >> LABS 7/13 in hosp near disch> Hg=8.2, MCV=93, BS=ok, K=3.3, Creat=0.85, Alb=3.2, LFTs=wnl... CXR 7/13 showed cardiomeg, intubated, low lung vols & bilat LL airsp dis L>R... 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  ~  May 23, 2012:  3wk ROV & Ryan Hesss swelling is diminished & his weight is down 13# to 173# today on Lasix40 & K10Bid;  BP= 112/70 & his strength is sl better w/ Losartan50 on HOLD & son is giving him Aten25 only if BP>110 sys... NOTE> he has restarted the ASA'81mg'$ /d but his COUMADIN is still on hold & pending Cardiology appt next week (Off Coumadin since 6/13 due to hematuria, then abd pain/ ELap, etc; he has had several coumadin complications)...    We reviewed prob list, meds, xrays and labs> see below for updates >>  ~  July 08, 2012:  6wk ROV & Ryan Hess is stable, slowly gaining strength, wt up 3# to 176# today, BP well regulated on his Lasix40 & takes the Aten25 only if BP>110;  Breathing stable on Advair250 + Spiriva & he uses the Mucinex as needed;  Due for f/u CBC & Fe on his FeSO4 '325mg'$ Bid...    We reviewed prob list, meds, xrays and labs> see below for updates >> OK Flu vaccine today... LABS 10/13:  Chems- wnl;  CBC- improved w/ Hg=13.2 & Fe looks good etc...  ~  October 07, 2012:  27moROV & [name] much better, stable, just c/o some back discomfort;  We reviewed the following medical problems during today's office visit >>     COPD, HxPneumonia> on Advair250, Spiriva, Mucinex; last CT 12/12 w/ severe emphysema & scarring, plus his extensive atherosclerotic dis/ ectatic thorAo/ aneurysm/ etc; his breathing is stable- mild cough, min sput, no hemoptysis, stable DOE, etc...     HBP, CHF, AFib w/ hx clot in LA appendage- resolved on Coumadin> on ASA81, Aten25, Lasix40, K10Bid; followed by DBurkina Fasofor Cards- seen 12/13 & his note is reviewed, no changes made...    Peripheral Vasc Dis> on ASA81 & Coumadin; followed by DrGearhardt [facility]Surg  [facility]x [facility])...  Chol> on Lip10; last FLP 12/12 showed TChol 107, TG 38, HDL 40, LDL 60    GI- Divertics, Polyps, Hx ischemic bowel> the latter was due to an internal hernia & closed loop obstruction & not from any thrombotic or embolic phenomenon; he has done well since the surgery...    GU- prostate cancer> followed by DrWrenn & he elected XRT- admin by DrKinard in 2009; he has a slowly rising PSa w/ a doubling time of 6-355mo he is considerinjg androgen ablation if necessary...    DJD> on Oxy-IR '5mg'$  prn; he is s/p bilat TKRs by DrOlin    Hx Anemia> on Fe &VI- Hg increased from 9.5 post op to 13.2 w/ Fe=70 (21% sat) in Oct2013... We reviewed prob list, meds, xrays and labs> see below for updates >>  CXR 1/14 showed cardiomegaly, aortic stent stable, clear lungs w/ basilar scarring, NAD...    ~  Feb 04, 2013:  410moOV & Ryan Hess [facility] in his planned thoracoabd aneurysm repair- he had his situation reviewed by DrBrabham here & [facility] since this needs to be done endovascularly & is quite complicated... We reviewed the following medical problems during today's office visit >>     COPD, HxPneumonia> on  Advair250, Spiriva, Mucinex; last CT 12/12 w/ severe emphysema & scarring, plus his extensive atherosclerotic dis/ ectatic thorAo/ aneurysm/ etc; his breathing is stable- mild cough, min sput, no hemoptysis, stable DOE, etc...     HBP, CHF, AFib w/ hx clot in LA appendage- resolved on Coumadin> on ASA81, Aten25, Lasix40, K10Bid; BP= 114/72 & he denies CP, palpit etc; followed by DrCherly Hensenor Cards- his notes are reviewed, stable- no changes made...    Peripheral Vasc Dis> on ASA81 & Coumadin; [facility] AAA repair by DrGrattonhe had a Thoracoabd Ao Aneurysm from the subclavian down to the prev Ao-bi-iliac graft, they decided on a fenestrated endovascular repair & a staged procedure- 4/13 they did the Thoracic endovasc aneurysm repair & he developed mult post op complications (see above 03/22/12 entry)... DrFarbers note indicates that to finish the repair he will require 3 branches and a left renal fenestration, they quoted him a risk of paraplegia in the 10-15% range & he will need preadmission for a lumbar drain to be placed, etc; [facility]    Chol> on Lip10; last FLP 12/12 showed TChol 107, TG 38, HDL 40, LDL 60    Hypothyroid> we have been following his TSH levels and he has been consistently 6-7 range; Labs 5/14 showed TSH=6.21 & we decided to start [id number]     GI- Divertics, Polyps, Hx ischemic bowel> the latter was due to an internal hernia & closed loop obstruction & not from any thrombotic or embolic phenomenon; he has done well since the surgery 7/13...    GU- BPH, hx prostate cancer, Renal insuffic> on Flomax0.4; followed by DrWrenn & he elected XRT- admin by DrKinard in 2009; he has a slowly rising PSA w/ a doubling time of 6-55mo40mo is considerinjg androgen ablation if necessary, Cr=1.5...  Marland KitchenMarland KitchenDJD> on Oxy-IR '5mg'$  prn; he is s/p bilat TKRs by DrOlin...    Hx Anemia> prev on Fe; Hg increased from 9.5 post op to 13.2 w/ Fe=70 (21%  sat) in Oct2013... We reviewed prob list, meds, xrays and labs> see below  for updates >>  LABS 5/14:  Chems- ok w/ stable mild RI (Cr=1.5);  CBC- wnl w/ Hg=14.2;  TSH=6.21 (Synthroid50 started);  BNP=263;  Protimes adjusted by the CC...   ~  May 19, 2013:  10moROV & Ryan [facility] 04/15/13 in a >4h operation w/ special spinal precautions he says; he developed a blood clot in his left arm w/ some persist swelling; [facility] labs [facility] (we do not yet have [facility] or [facility];  Pt reports that everything was OK but DrFarber wants to see better flow throught the areas of concern;  He has lost 7# down to 174# today;  BP looks good at 110/64 and O2 sats are 97% on RA at rest;  His CC is pain, esp back pain & he does not want to take narcotics- out of Percocet & he prefers Tramadol50 up to Tid & a musc relaxer- best covered is Tizanidine'4mg'$  tid as needed...           Problem List:  COPD (ICD-496) Hx of PNEUMONIA, ORGANISM UNSPECIFIED (ICD-486) PULMONARY NODULE & CAVITY RLL (ICD-518.89) - he is an ex-smoker, having quit in 1Gryglaafter 40 yrs of smoking... he was exercising regularly and walking daily ~167m5-6 days per week... on ADMurphyaily, +MUCINEX 2Bid w/ Fluids... ~  baseline CXR & CTChest w/ marked emphysema, atheromatous calcif, 5cm desc thorAA...  ~  CXR & CTA 2/11 in hosp showed cardiomeg + coronary calcif, advanced atherosclerosis of Ao w/ aneuryms 5.8cm into abd, underlying emphysema w/ interst edema & bilat effusions, no PE... ~  4/11:  RLL pneumonia which was slow to clear... ~  6/11:  f/u CXR & CT Chest 6/11 w/ 6cm cavitary area RLL & nodular component inferiorly, +hilar adenopathy, no mediastinal nodes, etc; Tumor markers showed CEA=7.5 & Ca19-9= 10.8; > IR felt lesion was too hi risk for bx, therefore contin Rx & observe. ~  8/11:  f/u CXR  w/o change in RLL opac (no worsening)>> repeat CEA=6.5; plan f/u CT Chest in 60m660mo  10/11: f/u CT Chest showed interval decrease in size of RLL cavitary lesion & the inferiorly placed soft tissue component; otherw there is severe emphysema, cardiomeg, coronary calcif, thoracoabd aneurysm w/o change; there was an air-fluid level in a dilated esoph> check Ba Esophagram= Nonspecific esophageal motility disorder with very poor primary esophageal contractions (no HH, stricture, etc)... ~  12/12: f/u CTChest showed severe emphysema & scarring w/ bibasilar atx (no infiltrates no cavities), markedly tortuous & ectaticThorAo w/ extensive atherosclerotic changes & no change in decr thor aneurysm measuring 5.9 x 5.6cm, mild cardiomeg & dense coronary calcif as well... ~  4/13:  He had a fenestrated endovasc repair of Thoraco-abdominal AA [facility] ~  subseq CXRs thru 7/13 hosp w/ cardiomeg, low lung vols, bibasilar airsp dis, etc... ~  CXR 1/14 showed cardiomeg, bibasilar fibrosis, thoracic aortic stent noted, NAD... [name]  7/14:  ?RUL pneumonia treated w/ Avelox & resolved=> pt [id number]                         << followed by DrNCherly Hensenr Cards >> CONGESTIVE HEART FAILURE (ICD-428.0) ATRIAL FIBRILLATION (ICD-427.31) w/ clot in left atrial appendage >> resolved on f/u  TEE, on COUMADIN per Cards & cardioverted. PREMATURE VENTRICULAR CONTRACTIONS, FREQUENT (ICD-427.69)  MEDS> prev on ATENOLOL '25mg'$ /d, & LASIX '20mg'$ -2AM + KCl 52mqBid, and off Losartan... prev Norvasc discontinued & hx ACE cough in the past on Lisinopril. ~  NuclearStressTest 1/05 was neg- no ischemia or infarct (+diaphrag attenuation), EF=56%... ~  repeat Nuclear study 6/09 was neg- no ischemia, mild inferoapic thinning, not gated due to PVCs... ~  2DEcho 6/09 showed mild dilated LV w/ EF= 45-50% but no regional wall motion abn, mild AoV calcif & AI, LA mild dil... ~  12/10:   changed from Atacand to LMottto save $$ ~  2/11:  in hosp- Cath= mild nonobstructive 3 vessel CAD, mod LVD w/ EF=40-45%; TEE= CHF w/ EF~45% w/ HK & left atrial appendage clot & AFib; he was diuresed & Norvasc stopped, placed on Coumadin w/ careful f/u by DCherly Hensen& the CC... ~  [facility] holding NSR w/ PVCs... ~  Recurrent AFib w/ RVR after his Thoracoabdominal stent graft 4/13... ~  6/13:  BP was low assoc w/ GU bleeding & ATENOLOL25 & LOSARTAN50 placed on HOLD... ~  COUMADIN on HOLD since GU bleeding & acute blood loss anemia; it has not yet been restarted... ~  8/13:  BP= 116/78 on LASIX '40mg'$ /d & K10-Bid;  Wt down 14# to 173# ~  10/13:  BP= 114/82 on Lasix40+K1-Bid (he takes Aten25 if BP>110 at home)... ~  12/13: he had Cards f/u DrNishan> note reviewed, no change in meds, he referred him to DrGearhart for f/u stent [facility] ~  5/14: HBP, CHF, AFib w/ hx clot in LA appendage- resolved on Coumadin> on ASA81, Aten25, Lasix40, K10Bid; BP= 114/72 & he denies CP, palpit etc; followed by DCherly Hensenfor Cards- his notes are reviewed, stable- no changes made. ~  8/14: [facility] DrFarber...  PERIPHERAL VASCULAR DISEASE (ICD-443.9) - back on ASA '81mg'$ /d... he is s/p infrarenal AAA repair w/ right common iliac aneurysm repair (via Ao Bi-iliac graft) in 1998 by DrLawson... also has a known desc thor AA measuring ~5+cm and followed by DrGearhart...  ~  seen by DrGearhart 4/10 w/ CT scan showing ~5cm thoracoabdominal aneurysm w/o signif change...  ~  seen by DrGearhart 2/11 in hosp w/ sl incr size of AAA- f/u planned 663mo~  also followed by [facility]- DrCooper. ~  seen by DrGearhardt 10/11 & stable, no change, f/u 1 yr. ~  Seen 12/12 by DrGearhardt & CTscan is stable, continue conservative approach... ~  2/13:  [facility]... ~  4/13:  He had a fenestrated endovasc repair of  Thoraco-abdominal AA [facility] by lumbar epidural hematomas (on Coumadin) w/ paraplegia requiring readmission for laminectomy & evac of the hematomas;  [facility] w/ altered mental status, pneumonia, AFib w/ rvr, & FTT> Neuro w/u was otherw neg (& he improved w/ supportive care)... ~  2/14:  CT Chest, Abdomen, Pelvis> per DrBrabham - see report (the abdominal component has enlarged)... ~  [facility]ys[facility]=> 5 stents placed by DrFarber 7/14...  HYPERCHOLESTEROLEMIA (ICD-272.0) - on LIPITOR  $'10mg'E$ /d...  ~  [place] 5/07 showed TChol 115, Tg 58, HDL 36, LDL 67 ~  FLP 5/08 showed TChol 118, TG 66, HDL 31, LDL 74 ~  FLP 5/09 showed TChol 126, TG 71, HDL 37, LDL 75 ~  FLP 6/10 showed TChol 116, TG 73, HDL 40, LDL 62 ~  2/11:  FLP not checked during the hospitalization... ~  [place] 4/11 in hosp showed TChol 94, TG 52, HDL 23, LDL 61 ~  FLP 12/12 on Lip10 showed TChol 107, TG 38, HDL 40, LDL 60 ~  He needs to ret for FASTING blood work...  HYPOTHYROIDISM >> he remains clinically euthyroid... ~  Labs 12/12 showed TSH= 6.10 ~  Labs 6/13 showed TSH= 6.66 ~  Labs 5/14 showed TSH= 6.21... We decided to start SYNTHROID 5mg/d...  DIVERTICULOSIS OF COLON (ICD-562.10) & COLONIC POLYPS (ICD-211.3) - hx polyps in 2003 = tubular adenoma... ~  last colonoscopy 9/06 showed divertics, hems, no polyps.  ISCHEMIC BOWEL >> GKalixpresented to the ER 7/23 - 04/25/12 w/ abd pain- ELap surg revealed ~5 feet of frankly ischemic small bowel, likely due to internal hernia and closed loop obstruction=> Bowel resected & pulses intact w/ no evidence of embolic or thrombotic pathology;  Viable small bowel anastomosed & wound vac applied; he had a stable post op course...  PROSTATE CANCER (ICD-185) - eval by  DrWrenn and they decided on XRT by DrKinard- finished 5/09 and he states that DrKinard "released me"... he sees DrWrenn every 6 months & they follow PSA closely (notes reviewed)... ~  8/12: f/u prostate cancer dx in 2009 & treated w/ XRT completed 6/09; slowly rising PSA w/ doubling time 6-948momin voiding symptoms, he is considering androgen ablation if needed... ~  6/13: He developed hematuria & urinary retention requiring Urology eval, discontinuation of Coumadin, & Foley placement=> subseq removed 8/13 w/ adeq voiding trial... ~  3/14:  He had f/u visit w/ DrWrenn- PSA nadir after XRT was <0.04 and it has climbed to 0.54 (3/14) w/ PSADT of ~1y57yrhey chose to continue watchful waiting...  DEGENERATIVE JOINT DISEASE (ICD-715.90) - s/p bilat TKR's, [facility]- DrOlin... He has OXY-IR '5mg'$  as needed for pain...  ACTINIC SKIN DAMAGE (ICD-692.70) - followed by DrHBrunetta Jeanse knows to avoid sun exposure, use sun screen, etc...  ANEMIA >> ~  Labs 6/13 showed Hg= 8.4, Fe= 31 (11%sat); Rec to start FeSO4 Bid... ~  Labs 7/13 in hosp showed Hg= 12.6=>8.2 at disch... ~  Labs 8/13 showed Hg= 9.5, MCV= 94; rec to continue FeSO4 Bid... ~  Labs 10/13 showed Hg= 13.2 & ok to wean Fe to 1/d til gone then stop... ~  Labs 5/14 showed Hg= 14.2   Past Surgical History  Procedure Laterality Date  . Total knee arthroplasty      bilateral  . Abdominal aortic aneurysm repair  1998  . Appendectomy    . Inguinal hernia repair      left  . Joint replacement    . Laparotomy  04/16/2012    Procedure: EXPLORATORY LAPAROTOMY;  Surgeon: BriMadilyn HookO;  [facility] [place] [place];  Laterality: N/A;  exploratory Laparotomy,Small bowel ressection abdominal wound vac placement.  . Laparotomy  04/18/2012    Procedure: EXPLORATORY LAPAROTOMY;  Surgeon: ThoJoyice Fasterornett, MD;  [facility] [place];  Laterality: N/A;  exploratory laparotomy with small bowel anastomosis  . Application of wound vac   04/18/2012    Procedure: APPLICATION OF WOUND VAC;  Surgeon: Joyice Faster. Cornett, MD;  Location: [place];  Service: General;  Laterality: N/A;  Removal of abdominal wound vac, Application of incisional  wound vac  . Repair of aa  04/14/2013    done at [place] Encounter Prescriptions as of 05/19/2013  Medication Sig Dispense Refill  . ADVAIR DISKUS 250-50 MCG/DOSE AEPB inhale 1 dose by mouth twice a day  60 each  6  . aspirin 81 MG tablet Take 81 mg by mouth daily.       [name] atenolol (TENORMIN) 25 MG tablet Take 25 mg by mouth daily. As need-only takes if BP is high      . atorvastatin (LIPITOR) 10 MG tablet Take 1 tablet (10 mg total) by mouth daily.  90 tablet  3  . furosemide (LASIX) 40 MG tablet Take 1 tablet (40 mg total) by mouth daily.  30 tablet  5  . guaiFENesin (MUCINEX) 600 MG 12 hr tablet Take 600 mg by mouth 2 (two) times daily. COPD.      [name] levothyroxine (SYNTHROID, LEVOTHROID) 50 MCG tablet Take 1 tablet (50 mcg total) by mouth daily before breakfast.  90 tablet  3  . Multiple Vitamin (MULTIVITAMIN) tablet Take 1 tablet by mouth daily.        . potassium chloride (K-DUR) 10 MEQ tablet Take 1 tablet (10 mEq total) by mouth 2 (two) times daily.  180 tablet  3  . Tamsulosin HCl (FLOMAX) 0.4 MG CAPS Take 0.4 mg by mouth at bedtime.       [name] tiotropium (SPIRIVA HANDIHALER) 18 MCG inhalation capsule Place 1 capsule (18 mcg total) into inhaler and inhale daily.  90 capsule  3  . warfarin (COUMADIN) 2.5 MG tablet Take 1 tablet (2.5 mg total) by mouth as directed.  90 tablet  1  . tiZANidine (ZANAFLEX) 4 MG capsule Take 1 capsule (4 mg total) by mouth 3 (three) times daily as needed for muscle spasms.  90 capsule  3  . traMADol (ULTRAM) 50 MG tablet Take 1 tablet (50 mg total) by mouth 3 (three) times daily as needed for pain.  90 tablet  3  . [DISCONTINUED] moxifloxacin (AVELOX) 400 MG tablet Take 1 tablet (400 mg total) by mouth daily.  7 tablet  0  . [DISCONTINUED] oxyCODONE  (OXY IR/ROXICODONE) 5 MG immediate release tablet As needed for pain       No facility-administered encounter medications on file as of 05/19/2013.    No Known Allergies   Current Medications, Allergies, Past Medical History, Past Surgical History, Family History, and Social History were reviewed in [employer] record.   Review of Systems        See HPI - all other systems neg except as noted...  The patient complains of dyspnea on exertion.  The patient denies anorexia, fever, weight loss, weight gain, vision loss, decreased hearing, hoarseness, chest pain, syncope, peripheral edema, prolonged cough, headaches, hemoptysis, abdominal pain, melena, hematochezia, severe indigestion/heartburn, hematuria, incontinence, muscle weakness, suspicious skin lesions, transient blindness, difficulty walking, depression, unusual weight change, abnormal bleeding, enlarged lymph nodes, and angioedema.     Objective:   Physical Exam    WD, WN, 79 y/o WM in NAD... GENERAL:  Alert & oriented; pleasant & cooperative; pale complexion... HEENT:  [place]/AT, EOM-wnl, PERRLA, EACs-clear, TMs-wnl, NOSE-clear, THROAT-clear & wnl. NECK:  Supple w/ fairROM; no JVD; normal carotid impulses w/o bruits; no thyromegaly or nodules palpated; no lymphadenopathy. CHEST:  decr BS  bilat, scat bibasilar rales, w/o wheezing/ rhonchi/ signs of consolidation. HEART:  irregular rhythm, gr1/6 diast murmur in Ao area, without rubs or gallops detected... ABDOMEN:  Soft & nontender; normal bowel sounds; no organomegaly or masses palpated... EXT: s/p bilat TKR's, mod arthritic changes; no varicose veins/ +venous insuffic/ no edema. NEURO:  CN's intact;  gait abn; no focal neuro deficits... DERM:  No lesions noted; no rash etc...  RADIOLOGY DATA:  Reviewed in the EPIC EMR & discussed w/ the patient...  LABORATORY DATA:  Reviewed in the EPIC EMR & discussed w/ the patient...   Assessment:      COPD, Hx  Pneumonia>  baseline severe COPD/Emphysema w/ Cardiomeg, Aneurysm & bibasilar fibrosis...  HBP>  controlled on Aten50-1/2, Lasix40, K10Bid; continue same...  CAD/ CHF/ etc>  Followed by Cherly Hensen & his prev notes are reviewed;  BNP is stable...  AFib>  On above + Coumadin followed in the [facility]...  Periph Vasc Dis>  S/p AAA repair 1998 by Sheryn Bison; then followed by DrGearhardt for thoraco-abd aneurysm & referred to [facility] for stent graft surg- done 4/13 by DrFarber w/ complications, and 2nd stage of the surgery 7/14 w/ 5 stents placed per pt...  Ven Insuffic & EDEMA>  Improved w/ Lasix40/d... He knows to elim salt, elev legs, wear support hose, etc...  CHOL>  FLP looks good on Lip10...  GI> Presbyesoph, Divertics, Hx polpyps>  Stable & followed by DrDBrodie... 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  Prostate Cancer>  Followed by DrWrenn  w/ slowly rising PSA as noted; then developed urinary retention after lumbar surg and hematuria after foley & coumadin; now back to baseline & being followed...  DJD>  Followed by DrOlin, s/p bilat TKRs...  ANEMIA>  He developed acute blood loss anemia w/ post op Hg= 11-12 but dropped to 8.6 w/ hematuria & INR=4.2; Coumadin & ASA placed on HOLD; f/u labs showed Hg=8.4 & Fe=31 & started on FeSO4 Bid;  Then Hg=9.5 & rec to continue Fe Bid;  Cards restarted his Coumadin;  Now Hg= 13.2 & Fe improved, ok to wean off Fe...  Actinic skin changes>  Aware, he is monitored by Derm...     Plan:     Patient's Medications  New Prescriptions   TIZANIDINE (ZANAFLEX) 4 MG CAPSULE    Take 1 capsule (4 mg total) by mouth 3 (three) times daily as needed for muscle spasms.   TRAMADOL (ULTRAM) 50 MG TABLET    Take 1 tablet (50 mg total) by mouth 3 (three) times daily as needed for pain.  Previous Medications   ADVAIR DISKUS 250-50 MCG/DOSE AEPB    inhale 1 dose by mouth  twice a day   ASPIRIN 81 MG TABLET    Take 81 mg by mouth daily.    ATENOLOL (TENORMIN) 25 MG TABLET    Take 25 mg by mouth daily. As need-only takes if BP is high   ATORVASTATIN (LIPITOR) 10 MG TABLET    Take 1 tablet (10 mg total) by mouth daily.   FUROSEMIDE (LASIX) 40 MG TABLET    Take 1 tablet (40 mg total) by mouth daily.   GUAIFENESIN (MUCINEX) 600 MG 12 HR TABLET    Take 600 mg by mouth 2 (two) times daily. COPD.   LEVOTHYROXINE (SYNTHROID, LEVOTHROID) 50 MCG TABLET    Take 1 tablet (50 mcg total) by mouth daily before breakfast.  MULTIPLE VITAMIN (MULTIVITAMIN) TABLET    Take 1 tablet by mouth daily.     POTASSIUM CHLORIDE (K-DUR) 10 MEQ TABLET    Take 1 tablet (10 mEq total) by mouth 2 (two) times daily.   TAMSULOSIN HCL (FLOMAX) 0.4 MG CAPS    Take 0.4 mg by mouth at bedtime.    TIOTROPIUM (SPIRIVA HANDIHALER) 18 MCG INHALATION CAPSULE    Place 1 capsule (18 mcg total) into inhaler and inhale daily.   WARFARIN (COUMADIN) 2.5 MG TABLET    Take 1 tablet (2.5 mg total) by mouth as directed.  Modified Medications   No medications on file  Discontinued Medications   MOXIFLOXACIN (AVELOX) 400 MG TABLET    Take 1 tablet (400 mg total) by mouth daily.   OXYCODONE (OXY IR/ROXICODONE) 5 MG IMMEDIATE RELEASE TABLET    As needed for pain

## 2013-05-19 NOTE — Patient Instructions (Addendum)
Today we updated your med list in our EPIC system...    Continue your current medications the same...  We decided to add TRAMADOL 50mg - one tab up to 3 times daily as needed for pain...    You may add an extra strength Tylenol w/ each dose to potentiate the effect...  We also wrote for a muscle relaxer- TIZANIDINE 4mg  up to 3 times daily as needed...  Call for any questions...  Let's plan a follow up visit in 41mo, sooner if needed for problems.Marland KitchenMarland Kitchen

## 2013-06-06 ENCOUNTER — Ambulatory Visit (INDEPENDENT_AMBULATORY_CARE_PROVIDER_SITE_OTHER): Payer: Medicare Other | Admitting: *Deleted

## 2013-06-06 DIAGNOSIS — I4891 Unspecified atrial fibrillation: Secondary | ICD-10-CM

## 2013-06-06 LAB — POCT INR: INR: 2.1

## 2013-06-30 ENCOUNTER — Other Ambulatory Visit (INDEPENDENT_AMBULATORY_CARE_PROVIDER_SITE_OTHER): Payer: Medicare Other

## 2013-06-30 ENCOUNTER — Ambulatory Visit (INDEPENDENT_AMBULATORY_CARE_PROVIDER_SITE_OTHER)
Admission: RE | Admit: 2013-06-30 | Discharge: 2013-06-30 | Disposition: A | Discharge status: Home / Self Care | Payer: Medicare Other | Source: Ambulatory Visit | Attending: Pulmonary Disease | Admitting: Pulmonary Disease

## 2013-06-30 ENCOUNTER — Ambulatory Visit (INDEPENDENT_AMBULATORY_CARE_PROVIDER_SITE_OTHER): Payer: Medicare Other | Admitting: Adult Health

## 2013-06-30 ENCOUNTER — Telehealth: Payer: Self-pay | Admitting: Pulmonary Disease

## 2013-06-30 ENCOUNTER — Encounter: Payer: Self-pay | Admitting: Adult Health

## 2013-06-30 VITALS — BP 114/60 | HR 83 | Temp 97.6°F | Ht 69.0 in | Wt 177.0 lb

## 2013-06-30 DIAGNOSIS — R042 Hemoptysis: Secondary | ICD-10-CM

## 2013-06-30 DIAGNOSIS — Z23 Encounter for immunization: Secondary | ICD-10-CM

## 2013-06-30 DIAGNOSIS — J189 Pneumonia, unspecified organism: Secondary | ICD-10-CM

## 2013-06-30 LAB — CBC WITH DIFFERENTIAL/PLATELET
Basophils Absolute: 0 10*3/uL (ref 0.0–0.1)
Basophils Relative: 0.4 % (ref 0.0–3.0)
Eosinophils Absolute: 0.1 10*3/uL (ref 0.0–0.7)
Eosinophils Relative: 2 % (ref 0.0–5.0)
HCT: 39.1 % (ref 39.0–52.0)
Hemoglobin: 13 g/dL (ref 13.0–17.0)
Lymphocytes Relative: 13.8 % (ref 12.0–46.0)
Lymphs Abs: 1 10*3/uL (ref 0.7–4.0)
MCHC: 33.2 g/dL (ref 30.0–36.0)
MCV: 93.6 fl (ref 78.0–100.0)
Monocytes Absolute: 0.8 10*3/uL (ref 0.1–1.0)
Monocytes Relative: 11.6 % (ref 3.0–12.0)
Neutro Abs: 5.3 10*3/uL (ref 1.4–7.7)
Neutrophils Relative %: 72.2 % (ref 43.0–77.0)
Platelets: 192 10*3/uL (ref 150.0–400.0)
RBC: 4.18 Mil/uL — ABNORMAL LOW (ref 4.22–5.81)
RDW: 15.6 % — ABNORMAL HIGH (ref 11.5–14.6)
WBC: 7.3 10*3/uL (ref 4.5–10.5)

## 2013-06-30 NOTE — Telephone Encounter (Signed)
lmomtcb x1 for [employer]

## 2013-06-30 NOTE — Patient Instructions (Addendum)
Saline nasal rinses As needed   Mucinex DM Twice daily  As needed  Congestion  Flu shot today  Please contact office for sooner follow up if symptoms do not improve or worsen or seek emergency care  I will call with lab results .

## 2013-06-30 NOTE — Progress Notes (Signed)
Subjective:     Patient ID: Ryan Hess, male   DOB: 10-24-1923, 79 y.o.   MRN: [id number]  HPI 79 y/o  06/30/2013 Acute OV  Complains of dark red blood mixed with greenish mucus a few times over last week. Does have some increased SOB, slight chest tightness, night sweats x1 week  .Describes as mild but has  weakness, fatigue, ashen per son x2 weeks - Pt says he is the same as always. Has some nasal congestion and did have some dark dried blood in nasal drainage as well. CXR today with no acute issues previous infiltrate resolved. No choking, n/v or dysphagia.  Says he would not have come in if he did not see blood in mucus. Denies wheezing, fever, nausea, vomiting.  Appetite is good.             Problem List:   COPD (ICD-496) Hx of PNEUMONIA, ORGANISM UNSPECIFIED (ICD-486) PULMONARY NODULE & CAVITY RLL (ICD-518.89) - he is an ex-smoker, having quit in [place] after 40 yrs of smoking... he was exercising regularly and walking daily ~11m 5-6 days per week... on ABangordaily, +MUCINEX 2Bid w/ Fluids... ~  baseline CXR & CTChest w/ marked emphysema, atheromatous calcif, 5cm desc thorAA...  ~  CXR & CTA 2/11 in hosp showed cardiomeg + coronary calcif, advanced atherosclerosis of Ao w/ aneuryms 5.8cm into abd, underlying emphysema w/ interst edema & bilat effusions, no PE... ~  4/11:  RLL pneumonia which was slow to clear... ~  6/11:  f/u CXR & CT Chest 6/11 w/ 6cm cavitary area RLL & nodular component inferiorly, +hilar adenopathy, no mediastinal nodes, etc; Tumor markers showed CEA=7.5 & Ca19-9= 10.8; > IR felt lesion was too hi risk for bx, therefore contin Rx & observe. ~  8/11:  f/u CXR w/o change in RLL opac (no worsening)>> repeat CEA=6.5; plan f/u CT Chest in 230mo~  10/11: f/u CT Chest showed interval decrease in size of RLL cavitary lesion & the inferiorly placed soft tissue component; otherw there is severe emphysema, cardiomeg, coronary calcif, thoracoabd aneurysm  w/o change; there was an air-fluid level in a dilated esoph> check Ba Esophagram= Nonspecific esophageal motility disorder with very poor primary esophageal contractions (no HH, stricture, etc)... ~  12/12: f/u CTChest showed severe emphysema & scarring w/ bibasilar atx (no infiltrates no cavities), markedly tortuous & ectaticThorAo w/ extensive atherosclerotic changes & no change in decr thor aneurysm measuring 5.9 x 5.6cm, mild cardiomeg & dense coronary calcif as well... ~  4/13:  He had a fenestrated end[facility]asc [facility] ~  subseq CXRs thru 7/13 hosp w/ cardiomeg, low lung vols, bibasilar airsp dis, etc... ~  CXR 1/14 showed cardiomeg, bibasilar fibrosis, thoracic aortic stent noted, NAD...   HYPERTENSION (ICD-401.9)                         << followed by DrCherly Hensenor Cards >> CONGESTIVE HEART FAILURE (ICD-428.0) ATRIAL FIBRILLATION (ICD-427.31) w/ clot in left atrial appendage >> resolved on f/u TEE, on COUMADIN per Cards & cardioverted. PREMATURE VENTRICULAR CONTRACTIONS, FREQUENT (ICD-427.69)  MEDS> prev on ATENOLOL '25mg'$ /d, & LASIX '20mg'$ -2AM + KCl 1052mid, and off Losartan... prev Norvasc discontinued & hx ACE cough in the past on Lisinopril. ~  NuclearStressTest 1/05 was neg- no ischemia or infarct (+diaphrag attenuation), EF=56%... ~  repeat Nuclear study 6/09 was neg- no ischemia, mild inferoapic thinning, not gated due to PVCs... ~  2DEcho 6/09 showed mild dilated LV w/ EF= 45-50% but no regional wall motion abn, mild AoV calcif & AI, LA mild dil... ~  12/10:  [place] to save $$ ~  2/11:  in hosp- Cath= mild nonobstructive 3 vessel CAD, mod LVD w/ EF=40-45%; TEE= CHF w/ EF~45% w/ HK & left atrial appendage clot & AFib; he was diuresed & Norvasc stopped, placed on Coumadin w/ careful f/u by Cherly Hensen & the [facility]... ~  Subseq successful [facility] & holding NSR w/ PVCs... ~  Recurrent AFib w/ RVR after his Thoracoabdominal stent  graft 4/13... ~  6/13:  BP was low assoc w/ GU bleeding & ATENOLOL25 & LOSARTAN50 placed on HOLD... ~  COUMADIN on HOLD since GU bleeding & acute blood loss anemia; it has not yet been restarted... ~  8/13:  BP= 116/78 on LASIX '40mg'$ /d & K10-Bid;  Wt down 14# to 173# ~  10/13:  BP= 114/82 on Lasix40+K1-Bid (he takes Aten25 if BP>110 at home)... ~  12/13: he had Cards f/u DrNishan> note reviewed, no change in meds, he referred him to DrGearhart for f/u stent as he did not want to ret to [facility]... ~  5/14: HBP, CHF, AFib w/ hx clot in LA appendage- resolved on Coumadin> on ASA81, Aten25, Lasix40, K10Bid; BP= 114/72 & he denies CP, palpit etc; followed by Cherly Hensen for Cards- his notes are reviewed, stable- no changes made.  PERIPHERAL VASCULAR DISEASE (ICD-443.9) - back on ASA '81mg'$ /d... he is s/p infrarenal AAA repair w/ right common iliac aneurysm repair (via Ao Bi-iliac graft) in 1998 by DrLawson... also has a known desc thor AA measuring ~5+cm and followed by DrGearhart...  ~  seen by DrGearhart 4/10 w/ CT scan showing ~5cm thoracoabdominal aneurysm w/o signif change...  ~  seen by DrGearhart 2/11 in hosp w/ sl incr size of AAA- f/u planned 36mo ~  also followed by [facility]- DrCooper. ~  seen by DrGearhardt 10/11 & stable, no change, f/u 1 yr. ~  Seen 12/12 by DrGearhardt & CTscan is stable, continue conservative approach... ~  2/13:  [facility]... ~  4/13:  He had a [facility]est[facility] complic by lumbar epidural hematomas (on Coumadin) w/ paraplegia requiring readmission for laminectomy & evac of the hematomas;  [facility] w/ altered mental status, pneumonia, AFib w/ rvr, & FTT> Neuro w/u was otherw neg (& he improved w/ supportive care)... ~  2/14:  CT  Chest, Abdomen, Pelvis> per DrBrabham - see report (the abdominal component has enlarged)... ~  [facility]...  HYPERCHOLESTEROLEMIA (ICD-272.0) - on LIPITOR '10mg'$ /d...  ~  [place] TChol 115, Tg 58, HDL 36, LDL 67 ~  FLP 5/08 showed TChol 118, TG 66, HDL 31, LDL 74 ~  FLP 5/09 showed TChol 126, TG 71, HDL 37, LDL 75 ~  FLP 6/10 showed TChol 116, TG 73, HDL 40, LDL 62 ~  2/11:  FLP not checked during the hospitalization... ~  FLP 4/11 in hosp showed TChol 94, TG 52, HDL 23, LDL 61 ~  FLP 12/12 on Lip10 showed TChol 107, TG 38, HDL 40, LDL 60  HYPOTHYROIDISM >>  he remains clinically euthyroid... ~  Labs 12/12 showed TSH= 6.10 ~  Labs 6/13 showed TSH= 6.66 ~  Labs 5/14 showed TSH= 6.21... We decided to start SYNTHROID 38mg/d...  DIVERTICULOSIS OF COLON (ICD-562.10) & COLONIC POLYPS (ICD-211.3) - hx polyps in 2003 = tubular adenoma... ~  last colonoscopy 9/06 showed divertics, hems, no polyps.  ISCHEMIC BOWEL >> GLatishapresented to the ER 7/23 - 04/25/12 w/ abd pain- ELap surg revealed ~5 feet of frankly ischemic small bowel, likely due to internal hernia and closed loop obstruction=> Bowel resected & pulses intact w/ no evidence of embolic or thrombotic pathology;  Viable small bowel anastomosed & wound vac applied; he had a stable post op course...  PROSTATE CANCER (ICD-185) - eval by DrWrenn and they decided on XRT by DrKinard- finished 5/09 and he states that DrKinard "released me"... he sees DrWrenn every 6 months & they follow PSA closely (notes reviewed)... ~  8/12: f/u prostate cancer dx in 2009 & treated w/ XRT completed 6/09; slowly rising PSA w/ doubling time 6-915momin voiding symptoms, he is considering androgen ablation if needed... ~  6/13: He developed hematuria & urinary retention requiring Urology eval, discontinuation of Coumadin, & Foley placement=> subseq removed 8/13 w/ adeq voiding trial... ~  3/14:  He had  f/u visit w/ DrWrenn- PSA nadir after XRT was <0.04 and it has climbed to 0.54 (3/14) w/ PSADT of ~1y7yrhey chose to continue watchful waiting...  DEGENERATIVE JOINT DISEASE (ICD-715.90) - s/p bilat TKR's, [facility]- DrOlin... He has OXY-IR '5mg'$  as needed for pain...  ACTINIC SKIN DAMAGE (ICD-692.70) - followed by DrHBrunetta Jeanse knows to avoid sun exposure, use sun screen, etc...  ANEMIA >> ~  Labs 6/13 showed Hg= 8.4, Fe= 31 (11%sat); Rec to start FeSO4 Bid... ~  Labs 7/13 in hosp showed Hg= 12.6=>8.2 at disch... ~  Labs 8/13 showed Hg= 9.5, MCV= 94; rec to continue FeSO4 Bid... ~  Labs 10/13 showed Hg= 13.2 & ok to wean Fe to 1/d til gone then stop... ~  Labs 5/14 showed Hg= 14.2   Past Surgical History  Procedure Laterality Date  . Total knee arthroplasty      bilateral  . Abdominal aortic aneurysm repair  1998  . Appendectomy    . Inguinal hernia repair      left  . Joint replacement    . Laparotomy  04/16/2012    Procedure: EXPLORATORY LAPAROTOMY;  Surgeon: BriMadilyn HookO;  [facility];  Laterality: N/A;  exploratory Laparotomy,Small bowel ressection abdominal wound vac placement.  . Laparotomy  04/18/2012    Procedure: EXPLORATORY LAPAROTOMY;  Surgeon: ThoJoyice Fasterornett, MD;  [facility];  Laterality: N/A;  exploratory laparotomy with small bowel anastomosis  . Application of wound vac  04/18/2012    Procedure: APPLICATION OF WOUND VAC;  Surgeon: ThoJoyice Fasterornett, MD;  [facility] [place] [place];  Laterality: N/A;  Removal of abdominal wound vac, Application of incisional  wound vac  . Repair of aa  04/14/2013    [facility] Prescriptions as of 06/30/2013  Medication Sig Dispense Refill  . ADVAIR DISKUS 250-50 MCG/DOSE AEPB inhale 1 dose by mouth twice a day  60 each  6  . aspirin 81 MG tablet Take 81 mg by mouth daily.       . aMarland Kitchenenolol (TENORMIN) 25 MG tablet Take 25 mg by mouth daily as  needed (  if BP is over 110).       [name] atorvastatin (LIPITOR) 10 MG tablet Take 1 tablet (10 mg total) by mouth daily.  90 tablet  3  . furosemide (LASIX) 40 MG tablet Take 1 tablet (40 mg total) by mouth daily.  30 tablet  5  . guaiFENesin (MUCINEX) 600 MG 12 hr tablet Take 600 mg by mouth 2 (two) times daily. COPD.      [name] levothyroxine (SYNTHROID, LEVOTHROID) 50 MCG tablet Take 1 tablet (50 mcg total) by mouth daily before breakfast.  90 tablet  3  . Multiple Vitamin (MULTIVITAMIN) tablet Take 1 tablet by mouth daily.        . potassium chloride (K-DUR) 10 MEQ tablet Take 1 tablet (10 mEq total) by mouth 2 (two) times daily.  180 tablet  3  . Tamsulosin HCl (FLOMAX) 0.4 MG CAPS Take 0.4 mg by mouth at bedtime.       [name] tiotropium (SPIRIVA HANDIHALER) 18 MCG inhalation capsule Place 1 capsule (18 mcg total) into inhaler and inhale daily.  90 capsule  3  . tiZANidine (ZANAFLEX) 4 MG capsule Take 1 capsule (4 mg total) by mouth 3 (three) times daily as needed for muscle spasms.  90 capsule  3  . traMADol (ULTRAM) 50 MG tablet Take 1 tablet (50 mg total) by mouth 3 (three) times daily as needed for pain.  90 tablet  3  . warfarin (COUMADIN) 2.5 MG tablet Take 1 tablet (2.5 mg total) by mouth as directed.  90 tablet  1   No facility-administered encounter medications on file as of 06/30/2013.    No Known Allergies   Current Medications, Allergies, Past Medical History, Past Surgical History, Family History, and Social History were reviewed in [employer] record.   Review of Systems        See HPI - all other systems neg except as noted...  The patient complains of dyspnea on exertion.  The patient denies anorexia, fever, weight loss, weight gain, vision loss, decreased hearing, hoarseness, chest pain, syncope, peripheral edema, prolonged cough, headaches, hemoptysis, abdominal pain, melena, hematochezia, severe indigestion/heartburn, hematuria, incontinence, muscle weakness,  suspicious skin lesions, transient blindness, difficulty walking, depression, unusual weight change, abnormal bleeding, enlarged lymph nodes, and angioedema.     Objective:   Physical Exam    WD, WN, 79 y/o WM in NAD... GENERAL:  Alert & oriented; pleasant & cooperative; pale complexion... HEENT:  [place]/AT, EOM-wnl, PERRLA, EACs-clear, TMs-wnl, NOSE-clear, THROAT-clear & wnl. NECK:  Supple w/ fairROM; no JVD; normal carotid impulses w/o bruits; no thyromegaly or nodules palpated; no lymphadenopathy. CHEST:  decr BS bilat, w/o wheezing/ rhonchi/ signs of consolidation. HEART:  irregular rhythm, gr1/6 diast murmur in Ao area, without rubs or gallops detected... ABDOMEN:  Soft & nontender; normal bowel sounds; no organomegaly or masses palpated... EXT: s/p bilat TKR's, mod arthritic changes; no varicose veins/ +venous insuffic/ no edema. NEURO:     gait abn; no focal neuro deficits... DERM:  No lesions noted; no rash etc...   CXR 06/30/2013   Resolution of previously seen infiltrate. No significant residual  abnormality is noted.   Assessment:

## 2013-06-30 NOTE — Telephone Encounter (Signed)
I spoke with the pt son and he states over the last 2 days the pt has been coughing up dark red blood several times a day. He states last time the pt did this he had pneumonia. Appt set for this PM at 4:15pm. I spoke with Shanda Bumps and she states pt will need CXR first. Order placed. Pt is aware.  Carron Curie, CMA

## 2013-07-01 ENCOUNTER — Ambulatory Visit (INDEPENDENT_AMBULATORY_CARE_PROVIDER_SITE_OTHER): Payer: Medicare Other | Admitting: Cardiology

## 2013-07-01 DIAGNOSIS — I4891 Unspecified atrial fibrillation: Secondary | ICD-10-CM

## 2013-07-01 LAB — PROTIME-INR
INR: 3.7 ratio — ABNORMAL HIGH (ref 0.8–1.0)
Prothrombin Time: 37.7 s — ABNORMAL HIGH (ref 10.2–12.4)

## 2013-07-03 NOTE — Assessment & Plan Note (Addendum)
No sign of PNA on cxr.  Has some minimal dark blood tinged sputum.-no frank hemoptysis.  Check labs with INR and cbc  No sign of infectious source. Hold on abx for now.   Plan  Saline nasal rinses As needed   Mucinex DM Twice daily  As needed  Congestion  Flu shot today  Please contact office for sooner follow up if symptoms do not improve or worsen or seek emergency care  I will call with lab results .

## 2013-07-16 ENCOUNTER — Other Ambulatory Visit: Payer: Self-pay | Admitting: Pulmonary Disease

## 2013-07-16 MED ORDER — ATORVASTATIN CALCIUM 10 MG PO TABS: 10.0000 mg | ORAL_TABLET | Freq: Every day | ORAL | Status: DC

## 2013-07-18 ENCOUNTER — Ambulatory Visit (INDEPENDENT_AMBULATORY_CARE_PROVIDER_SITE_OTHER): Payer: Medicare Other | Admitting: General Practice

## 2013-07-18 DIAGNOSIS — I4891 Unspecified atrial fibrillation: Secondary | ICD-10-CM

## 2013-07-18 LAB — POCT INR: INR: 1.8

## 2013-07-22 ENCOUNTER — Other Ambulatory Visit: Payer: Self-pay | Admitting: *Deleted

## 2013-07-22 MED ORDER — FLUTICASONE-SALMETEROL 250-50 MCG/DOSE IN AEPB: INHALATION_SPRAY | RESPIRATORY_TRACT | Status: DC

## 2013-07-22 MED ORDER — ATORVASTATIN CALCIUM 10 MG PO TABS: 10.0000 mg | ORAL_TABLET | Freq: Every day | ORAL | Status: DC

## 2013-08-01 ENCOUNTER — Ambulatory Visit (INDEPENDENT_AMBULATORY_CARE_PROVIDER_SITE_OTHER): Payer: Medicare Other | Admitting: Pharmacist

## 2013-08-01 DIAGNOSIS — I4891 Unspecified atrial fibrillation: Secondary | ICD-10-CM

## 2013-08-01 LAB — POCT INR: INR: 2.7

## 2013-08-12 ENCOUNTER — Other Ambulatory Visit: Payer: Self-pay | Admitting: *Deleted

## 2013-08-12 MED ORDER — ATENOLOL 25 MG PO TABS: 25.0000 mg | ORAL_TABLET | Freq: Every day | ORAL | Status: DC | PRN

## 2013-08-14 ENCOUNTER — Other Ambulatory Visit: Payer: Self-pay | Admitting: *Deleted

## 2013-08-14 MED ORDER — TIOTROPIUM BROMIDE MONOHYDRATE 18 MCG IN CAPS: 18.0000 ug | ORAL_CAPSULE | Freq: Every day | RESPIRATORY_TRACT | Status: DC

## 2013-08-19 ENCOUNTER — Encounter: Payer: Self-pay | Admitting: Pulmonary Disease

## 2013-08-19 ENCOUNTER — Ambulatory Visit (INDEPENDENT_AMBULATORY_CARE_PROVIDER_SITE_OTHER): Payer: Medicare Other | Admitting: Pulmonary Disease

## 2013-08-19 VITALS — BP 118/62 | HR 63 | Temp 97.5°F | Ht 69.0 in | Wt 177.0 lb

## 2013-08-19 DIAGNOSIS — I509 Heart failure, unspecified: Secondary | ICD-10-CM

## 2013-08-19 DIAGNOSIS — D500 Iron deficiency anemia secondary to blood loss (chronic): Secondary | ICD-10-CM

## 2013-08-19 DIAGNOSIS — I4891 Unspecified atrial fibrillation: Secondary | ICD-10-CM

## 2013-08-19 DIAGNOSIS — M545 Low back pain, unspecified: Secondary | ICD-10-CM | POA: Insufficient documentation

## 2013-08-19 DIAGNOSIS — I10 Essential (primary) hypertension: Secondary | ICD-10-CM

## 2013-08-19 DIAGNOSIS — C61 Malignant neoplasm of prostate: Secondary | ICD-10-CM

## 2013-08-19 DIAGNOSIS — M1990 Unspecified osteoarthritis, unspecified site: Secondary | ICD-10-CM

## 2013-08-19 DIAGNOSIS — I5022 Chronic systolic (congestive) heart failure: Secondary | ICD-10-CM

## 2013-08-19 DIAGNOSIS — J4489 Other specified chronic obstructive pulmonary disease: Secondary | ICD-10-CM

## 2013-08-19 DIAGNOSIS — J449 Chronic obstructive pulmonary disease, unspecified: Secondary | ICD-10-CM

## 2013-08-19 DIAGNOSIS — N289 Disorder of kidney and ureter, unspecified: Secondary | ICD-10-CM

## 2013-08-19 DIAGNOSIS — I739 Peripheral vascular disease, unspecified: Secondary | ICD-10-CM

## 2013-08-19 MED ORDER — TIZANIDINE HCL 4 MG PO CAPS: 4.0000 mg | ORAL_CAPSULE | Freq: Three times a day (TID) | ORAL | Status: DC | PRN

## 2013-08-19 NOTE — Patient Instructions (Signed)
Today we updated your med list in our EPIC system...    Continue your current medications the same...  We refilled the Tizanidine 4mg  - take one tab 3 times daily for musc spasm...  For your back pain>>    Apply heat w/ hot topwel & heating pad during the day...    Try the deep heat patch like Salonpas at night...    Use the Tramadol/ Aleve/ Tizanidine as discussed...  We will arrange for an Ortho appt ASAP   Call for any questions...  Let's plan a follow up visit in 57mo, w/ CXR & FASTING blood work.Marland KitchenMarland Kitchen

## 2013-08-19 NOTE — Progress Notes (Signed)
Subjective:     Patient ID: Ryan Hess, male   DOB: [birth year], 79 y.o.   MRN: [id number]  HPI 79 y/o WM here for a follow up visit... he has multiple medical problems as noted below...    ~  March 22, 2012:  19moROV & GMeldrickhas been through a lot >> In Feb2013 he [facility]- starting 5cm distal to the left subclavian down to the prox aspect of there prev [name] graft;  They chose to proceed w/ a fenestrated endovasc repair> done 01/01/12;  Disch on Coumadin for his AFib & Lovenox bridge but he developed a complic w/ lumbar epidural hematomas w/ paraplegia requiring readmission for laminectomy & evac of the hematomas;  [facility] and then [facility] but only spent 4d [facility] w/ altered mental status, pneumonia, AFib w/ rvr, & FTT> Neuro w/u was otherw neg (& he improved w/ supportive care); CXR showed L>R bibasilar airsp dis (covered w/ IV antibiotics & disch on Avelox); AFib was rate controlled w/ med adjustments & Coumadin continued; Urinary retension required foley cath & Urology outpt management; his protimes got too thin & he developed hematuria & acute blood loss anemia; now BP has been low necessitating a HOLD on his BP med...  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  Bilat lower lobe pneumonias treated in-pt 5/13 and disch on Avelox > He is afeb & starting to feel better, actually looks pretty good (just pale); CXR today showed resolution of infiltrates...  AFib w/ RVR> meds adjusted and Coumadin carefully monitored; Heart rate= 62 off Atenolol; Protime=13.6/1.2 Off Coumadin x  several days now; decision will need to be made regarding restart coumadin when safe...  Urinary retension from the lumbar surg and hematuria developing while on coumadin > he has foley cath & leg bag w/ clear yellow urine at present; appt to see DrWrenn in 3d to consider further options (?voiding trial, in&out cath, etc)...  Hypotension w/ weakness & required down titration in BP meds & careful monitoring of BP etc > currently on Lasix40, K10Bid; BP= 118/66 and has varied 90's/70's to 120's/80's at home OFF his prev Aten25 & Losartan50;  Chem's are WNL & BNP=552...  Anemia > He developed acute blood loss anemia w/ post op Hg= 11-12 but dropped to 8.6 w/ hematuria & INR=4.2; Coumadin & ASA placed on HOLD; f/u labs showed Hg=8.4, Fe=31 (11%sat);  Rec to start FeSO4 Bid now...  Complaints of constipation > we reviewed the need for MIRALAX 17gm in water daily, and SENAKOT-S 2tabs at bedtime... We reviewed prob list, meds, xrays and labs> CXR 6/13 showed stable cardiac silhouette, sl incr markings chronically, NAD, no edema, Ao stent in desc Ao appears stable... LABS 6/13:  Chems- ok w/ BUN=22 Creat=1.4 BNP=552;  CBC- Hg=8.4 Fe=31(11%sat) B12=953;  Protime=13.6/ INR1.2;  TSH=6.66  ~  May 03, 2012:  6wk ROV & post hosp follow up> GAbdellahpresented to the ER 7/23 - 04/25/12 w/ abd pain- ELap surg revealed ~5 feet of frankly ischemic small bowel, likely due to internal hernia and closed loop obstruction=> Bowel resected &  pulses intact w/ no evidence of embolic or thrombotic pathology;  Viable small bowel anastomosed & wound vac applied; he had a stable post op course=> foley left in place;  Disch 8/1 w/ home health etc...    Since disch we had call from son regarding pts Cardiac meds and BP> when last seen his BP was low & he was weak- Aten & Losartan placed on hold, continued on Lasix40, he's been off his Coumadin since 6/13 w/ hematuria/ anemia=> hasn't yet been restarted & only covered transiently in hosp  w/ Lovenox, disch on ASA81, no coumadin;  BP since disch was still low (95/70) so Aten, Losartan kept on [place] continued...     He is pale & weak but gaining strength he says;  He saw Urology 8/8 w/ adeq voiding trial & Foley removed, voiding satis so far;  Since disch he's noted some pitting edema in LEs and L>R arm swelling (he had left IJ line in hosp)=> we decided to check VenDopplers ASAP & prelim report indicates no subclavian clots;  Needs Cards follow up & decision regarding restart of Coumadin w/ careful protime monitoring...  We reviewed prob list, meds, xrays and labs >> LABS 7/13 in hosp near disch> Hg=8.2, MCV=93, BS=ok, K=3.3, Creat=0.85, Alb=3.2, LFTs=wnl... CXR 7/13 showed cardiomeg, intubated, low lung vols & bilat LL airsp dis L>R... 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  ~  May 23, 2012:  3wk ROV & Ryan Hess's swelling is diminished & his weight is down 13# to 173# today on Lasix40 & K10Bid;  BP= 112/70 & his strength is sl better w/ Losartan50 on HOLD & son is giving him Aten25 only if BP>110 sys... NOTE> he has restarted the ASA'81mg'$ /d but his COUMADIN is still on hold & pending Cardiology appt next week (Off Coumadin since 6/13 due to hematuria, then abd pain/ ELap, etc; he has had several coumadin complications)...    We reviewed prob list, meds, xrays and labs> see below for updates >>  ~  July 08, 2012:  6wk ROV & Ryan Hess is stable, slowly gaining strength, wt up 3# to 176# today, BP well regulated on his Lasix40 & takes the Aten25 only if BP>110;  Breathing stable on Advair250 + Spiriva & he uses the Mucinex as needed;  Due for f/u CBC & Fe on his FeSO4 '325mg'$ Bid...    We reviewed prob list, meds, xrays and labs> see below for updates >> OK Flu vaccine today... LABS 10/13:  Chems- wnl;  CBC- improved w/ Hg=13.2 & Fe looks good etc...  ~  October 07, 2012:  20moROV & GKyleris much better, stable, just c/o some back discomfort;  We reviewed the following medical problems during today's office visit >>     COPD, HxPneumonia> on Advair250, Spiriva, Mucinex; last CT 12/12 w/ severe emphysema & scarring, plus his extensive atherosclerotic dis/ ectatic thorAo/ aneurysm/ etc; his breathing is stable- mild cough, min sput, no hemoptysis, stable DOE, etc...     HBP, CHF, AFib w/ hx clot in LA appendage- resolved on Coumadin> on ASA81, Aten25, Lasix40, K10Bid; followed by DBurkina Fasofor Cards- seen 12/13 & his note is reviewed, no changes made...    Peripheral Vasc Dis> on ASA81 & Coumadin; followed by DrGearhardt for [facility]ascSurg  [facility]see extensive hx below)...  Chol> on Lip10; last FLP 12/12 showed TChol 107, TG 38, HDL 40, LDL 60    GI- Divertics, Polyps, Hx ischemic bowel> the latter was due to an internal hernia & closed loop obstruction & not from any thrombotic or embolic phenomenon; he has done well since the surgery...    GU- prostate cancer> followed by DrWrenn & he elected XRT- admin by DrKinard in 2009; he has a slowly rising PSa w/ a doubling time of 6-55mo he is considerinjg androgen ablation if necessary...    DJD> on Oxy-IR '5mg'$  prn; he is s/p bilat TKRs by DrOlin    Hx Anemia> on Fe &VI- Hg increased from 9.5 post op to 13.2 w/ Fe=70 (21% sat) in Oct2013... We reviewed prob list, meds, xrays and labs> see below for updates >>  CXR 1/14 showed cardiomegaly, aortic stent stable, clear lungs w/ basilar scarring, NAD...    ~  Feb 04, 2013:  427moOV & Ryan Hess [facility] in his planned thoracoabd aneurysm repair- he had his situation reviewed by DrBrabham here & [facility] since this needs to be done endovascularly & is quite complicated... We reviewed the following medical problems during today's office visit >>     COPD, HxPneumonia> on  Advair250, Spiriva, Mucinex; last CT 12/12 w/ severe emphysema & scarring, plus his extensive atherosclerotic dis/ ectatic thorAo/ aneurysm/ etc; his breathing is stable- mild cough, min sput, no hemoptysis, stable DOE, etc...     HBP, CHF, AFib w/ hx clot in LA appendage- resolved on Coumadin> on ASA81, Aten25, Lasix40, K10Bid; BP= 114/72 & he denies CP, palpit etc; followed by DrCherly Hensenor Cards- his notes are reviewed, stable- no changes made...    Peripheral Vasc Dis> on ASA81 & Coumadin; [facility] AAA repair by DrWaldronhe had a Thoracoabd Ao Aneurysm from the subclavian down to the prev Ao-bi-iliac graft, they decided on a fenestrated endovascular repair & a staged procedure- 4/13 they did the Thoracic endovasc aneurysm repair & he developed mult post op complications (see above 03/22/12 entry)... DrFarbers note indicates that to finish the repair he will require 3 branches and a left renal fenestration, they quoted him a risk of paraplegia in the 10-15% range & he will need preadmission for a lumbar drain to be placed, etc; [facility]    Chol> on Lip10; last FLP 12/12 showed TChol 107, TG 38, HDL 40, LDL 60    Hypothyroid> we have been following his TSH levels and he has been consistently 6-7 range; Labs 5/14 showed TSH=6.21 & we decided to start SYNTHROID5059md     GI- Divertics, Polyps, Hx ischemic bowel> the latter was due to an internal hernia & closed loop obstruction & not from any thrombotic or embolic phenomenon; he has done well since the surgery 7/13...    GU- BPH, hx prostate cancer, Renal insuffic> on Flomax0.4; followed by DrWrenn & he elected XRT- admin by DrKinard in 2009; he has a slowly rising PSA w/ a doubling time of 6-37mo837mo is considerinjg androgen ablation if necessary, Cr=1.5...  Marland KitchenMarland KitchenDJD> on Oxy-IR '5mg'$  prn; he is s/p bilat TKRs by DrOlin...    Hx Anemia> prev on Fe; Hg increased from 9.5 post op to 13.2 w/ Fe=70 (21%  sat) in Oct2013... We reviewed prob list, meds, xrays and labs> see below  for updates >>  LABS 5/14:  Chems- ok w/ stable mild RI (Cr=1.5);  CBC- wnl w/ Hg=14.2;  TSH=6.21 (Synthroid50 started);  BNP=263;  Protimes adjusted by the CC...   ~  May 19, 2013:  69moROV & GDaevonhas [facility] 04/15/13 in a >4h operation w/ special spinal precautions he says; he developed a blood clot in his left arm w/ some persist swelling; [facility] labs [facility] ([facility];  Pt reports that everything was OK but DrFarber wants to see better flow throught the areas of concern;  He has lost 7# down to 174# today;  BP looks good at 110/64 and O2 sats are 97% on RA at rest;  His CC is pain, esp back pain & he does not want to take narcotics- out of Percocet & he prefers Tramadol50 up to Tid & a musc relaxer- best covered is Tizanidine'4mg'$  tid as needed...  ~  August 19, 2013:  3381moOV & GeDushauns c/o some LBP after trying to lift wife several weeks ago; he has Tramadol Tid & using heat, we will refill Tizanidine and rec f/u w/ Ortho if not improving for poss shot... Otherw stable, doing satis overall...     Breathing is stable on Advair, spiriva, & Mucinex...    BP & Cardiovasc stable on Aten, Lasix, Coumadin...     Lipids stable on Lip10...     Thyroid stable on Synthroid50=> due for f/u TSH but wants to wait til next OV for FASTING blood work...    GU, BPH are stable on flomax0.'4mg'$ /d...  We reviewed prob list, meds, xrays and labs> see below for updates >> he had the 2014 Flu vaccine 10/14...  CXR 10/14 showed chr changes of Ao stent graft, stable cardiomegaly, no infiltrates, NAD...  LABS 10/14:  CBC- wnl;  Protimes followed in CC...             Problem List:  COPD (ICD-496) Hx of PNEUMONIA, ORGANISM UNSPECIFIED (ICD-486) PULMONARY NODULE & CAVITY RLL  (ICD-518.89) - he is an ex-smoker, having quit in 19Berthafter 40 yrs of smoking... he was exercising regularly and walking daily ~26m69m-6 days per week... on ADVCheyenneily, +MUCINEX 2Bid w/ Fluids... ~  baseline CXR & CTChest w/ marked emphysema, atheromatous calcif, 5cm desc thorAA...  ~  CXR & CTA 2/11 in hosp showed cardiomeg + coronary calcif, advanced atherosclerosis of Ao w/ aneuryms 5.8cm into abd, underlying emphysema w/ interst edema & bilat effusions, no PE... ~  4/11:  RLL pneumonia which was slow to clear... ~  6/11:  f/u CXR & CT Chest 6/11 w/ 6cm cavitary area RLL & nodular component inferiorly, +hilar adenopathy, no mediastinal nodes, etc; Tumor markers showed CEA=7.5 & Ca19-9= 10.8; > IR felt lesion was too hi risk for bx, therefore contin Rx & observe. ~  8/11:  f/u CXR w/o change in RLL opac (no worsening)>> repeat CEA=6.5; plan f/u CT Chest in 81mo65mo 10/11: f/u CT Chest showed interval decrease in size of RLL cavitary lesion & the inferiorly placed soft tissue component; otherw there is severe emphysema, cardiomeg, coronary calcif, thoracoabd aneurysm w/o change; there was an air-fluid level in a dilated esoph> check Ba Esophagram= Nonspecific esophageal motility disorder with very poor primary esophageal contractions (no HH, stricture, etc)... ~  12/12: f/u CTChest showed severe emphysema & scarring w/ bibasilar atx (no infiltrates no cavities), markedly tortuous & ectaticThorAo w/ extensive atherosclerotic changes & no change in decr thor aneurysm measuring 5.9 x 5.6cm, mild cardiomeg & dense coronary calcif as well... ~  4/13:  He had a fenestrated endovasc repair of Thoraco-abdominal AA by [facility]Far[facility]... ~  subseq CXRs thru 7/13 hosp w/ cardiomeg, low lung vols, bibasilar airsp dis, etc... ~  CXR 1/14 showed cardiomeg, bibasilar fibrosis, thoracic aortic stent noted, NAD.[name]. ~  7/14:  ?RUL pneumonia treated w/ Avelox & resolved=> pt had his planned vasc surg  04/15/13 at [facility] ~  CXR 10/14 showed chr changes of Ao stent graft, stable cardiomegaly, no infiltrates, NAD...   HYPERTENSION (ICD-401.9)                         << followed by Cherly Hensen for Cards >> CONGESTIVE HEART FAILURE (ICD-428.0) ATRIAL FIBRILLATION (ICD-427.31) w/ clot in left atrial appendage >> resolved on f/u TEE, on COUMADIN per Cards & cardioverted. PREMATURE VENTRICULAR CONTRACTIONS, FREQUENT (ICD-427.69)  MEDS> prev on ATENOLOL '25mg'$ /d, & LASIX '20mg'$ -2AM + KCl 61mqBid, and off Losartan... prev Norvasc discontinued & hx ACE cough in the past on Lisinopril. ~  NuclearStressTest 1/05 was neg- no ischemia or infarct (+diaphrag attenuation), EF=56%... ~  repeat Nuclear study 6/09 was neg- no ischemia, mild inferoapic thinning, not gated due to PVCs... ~  2DEcho 6/09 showed mild dilated LV w/ EF= 45-50% but no regional wall motion abn, mild AoV calcif & AI, LA mild dil... ~  12/10:  [place] ~  2/11:  in hosp- Cath= mild nonobstructive 3 vessel CAD, mod LVD w/ EF=40-45%; TEE= CHF w/ EF~45% w/ HK & left atrial appendage clot & AFib; he was diuresed & Norvasc stopped, placed on Coumadin w/ careful f/u by DCherly Hensen& the CC... ~  [facility] w/ PVCs... ~  Recurrent AFib w/ RVR after his Thoracoabdominal stent graft 4/13... ~  6/13:  BP was low assoc w/ GU bleeding & ATENOLOL25 & LOSARTAN50 placed on HOLD... ~  COUMADIN on HOLD since GU bleeding & acute blood loss anemia; it has not yet been restarted... ~  8/13:  BP= 116/78 on LASIX '40mg'$ /d & K10-Bid;  Wt down 14# to 173# ~  10/13:  BP= 114/82 on Lasix40+K1-Bid (he takes Aten25 if BP>110 at home)... ~  12/13: he had Cards f/u DrNishan> note reviewed, no change in meds, he referred him to DrGearhart for f/u stent as [facility] ~  5/14: HBP, CHF, AFib w/ hx clot in LA appendage- resolved on Coumadin> on ASA81, Aten25, Lasix40, K10Bid; BP= 114/72 & he denies CP,  palpit etc; followed by DCherly Hensenfor Cards- his notes are reviewed, stable- no changes made. ~  8/14: [facility] DrFarber...  PERIPHERAL VASCULAR DISEASE (ICD-443.9) - back on ASA '81mg'$ /d... he is s/p infrarenal AAA repair w/ right common iliac aneurysm repair (via Ao Bi-iliac graft) in 1998 by DrLawson... also has a known desc thor AA measuring ~5+cm and followed by DrGearhart...  ~  seen by DrGearhart 4/10 w/ CT scan showing ~5cm thoracoabdominal aneurysm w/o signif change...  ~  seen by DrGearhart 2/11 in hosp w/ sl incr size of AAA- f/u planned 648mo~  also followed by [facility]- DrCooper. ~  seen  by DrGearhardt 10/11 & stable, no change, f/u 1 yr. ~  Seen 12/12 by DrGearhardt & CTscan is stable, continue conservative approach... ~  2/13:  He has eval in the Endovasc clinic at [facility] by DrFarber... ~  4/13:  He had a fenestrated endovasc repair of Thoraco-abdominal AA by DrFarber at [facility], complic by lumbar epidural hematomas (on Coumadin) w/ paraplegia requiring readmission for laminectomy & evac of the hematomas;  He was sent to [facility] for rehab 4/18 - 01/30/12 and then disch to SN[facility]) but only spent 4d there before he had to be readmit to [facility] 5/11 - 02/09/12 by [facility] w/ altered mental status, pneumonia, AFib w/ rvr, & FTT> Neuro w/u was otherw neg (& he improved w/ supportive care)... ~  2/14:  CT Chest, Abdomen, Pelvis> per DrBrabham - see report (the abdominal component has enlarged)... ~  He is awaiting f/u at [facility] for the second stage of the Aneurysm repair=> 5 stents placed by DrFarber 7/14...  HYPERCHOLESTEROLEMIA (ICD-272.0) - on LIPITOR '10mg'$ /d...  ~  Simpson 5/07 showed TChol 115, Tg 58, HDL 36, LDL 67 ~  FLP 5/08 showed TChol 118, TG 66, HDL 31, LDL 74 ~  FLP 5/09 showed TChol 126, TG 71, HDL 37, LDL 75 ~  FLP 6/10 showed TChol 116, TG 73, HDL 40, LDL 62 ~  2/11:  FLP not checked during the hospitalization... ~  Hooper 4/11 in hosp  showed TChol 94, TG 52, HDL 23, LDL 61 ~  FLP 12/12 on Lip10 showed TChol 107, TG 38, HDL 40, LDL 60 ~  He needs to ret for FASTING blood work...  HYPOTHYROIDISM >> he remains clinically euthyroid... ~  Labs 12/12 showed TSH= 6.10 ~  Labs 6/13 showed TSH= 6.66 ~  Labs 5/14 showed TSH= 6.21... We decided to start SYNTHROID 37mg/d...  DIVERTICULOSIS OF COLON (ICD-562.10) & COLONIC POLYPS (ICD-211.3) - hx polyps in 2003 = tubular adenoma... ~  last colonoscopy 9/06 showed divertics, hems, no polyps.  ISCHEMIC BOWEL >> GChasitypresented to the ER 7/23 - 04/25/12 w/ abd pain- ELap surg revealed ~5 feet of frankly ischemic small bowel, likely due to internal hernia and closed loop obstruction=> Bowel resected & pulses intact w/ no evidence of embolic or thrombotic pathology;  Viable small bowel anastomosed & wound vac applied; he had a stable post op course...  PROSTATE CANCER (ICD-185) - eval by DrWrenn and they decided on XRT by DrKinard- finished 5/09 and he states that DrKinard "released me"... he sees DrWrenn every 6 months & they follow PSA closely (notes reviewed)... ~  8/12: f/u prostate cancer dx in 2009 & treated w/ XRT completed 6/09; slowly rising PSA w/ doubling time 6-946momin voiding symptoms, he is considering androgen ablation if needed... ~  6/13: He developed hematuria & urinary retention requiring Urology eval, discontinuation of Coumadin, & Foley placement=> subseq removed 8/13 w/ adeq voiding trial... ~  3/14:  He had f/u visit w/ DrWrenn- PSA nadir after XRT was <0.04 and it has climbed to 0.54 (3/14) w/ PSADT of ~1y62yrhey chose to continue watchful waiting...  DEGENERATIVE JOINT DISEASE (ICD-715.90) - s/p bilat TKR's, [facility]- DrOlin... He has OXY-IR '5mg'$  as needed for pain...  ACTINIC SKIN DAMAGE (ICD-692.70) - followed by DrHBrunetta Jeanse knows to avoid sun exposure, use sun screen, etc...  ANEMIA >> ~  Labs 6/13 showed Hg= 8.4, Fe= 31 (11%sat); Rec to start FeSO4 Bid... ~   Labs 7/13 in hosp showed Hg= 12.6=>8.2 at  disch... ~  Labs 8/13 showed Hg= 9.5, MCV= 94; rec to continue FeSO4 Bid... ~  Labs 10/13 showed Hg= 13.2 & ok to wean Fe to 1/d til gone then stop... ~  Labs 5/14 showed Hg= 14.2   Past Surgical History  Procedure Laterality Date  . Total knee arthroplasty      bilateral  . Abdominal aortic aneurysm repair  1998  . Appendectomy    . Inguinal hernia repair      left  . Joint replacement    . Laparotomy  04/16/2012    Procedure: EXPLORATORY LAPAROTOMY;  Surgeon: Madilyn Hook, DO;  Location: [place];  Service: General;  Laterality: N/A;  exploratory Laparotomy,Small bowel ressection abdominal wound vac placement.  . Laparotomy  04/18/2012    Procedure: EXPLORATORY LAPAROTOMY;  Surgeon: Joyice Faster. Cornett, MD;  Location: [place];  Service: General;  Laterality: N/A;  exploratory laparotomy with small bowel anastomosis  . Application of wound vac  04/18/2012    Procedure: APPLICATION OF WOUND VAC;  Surgeon: Joyice Faster. Cornett, MD;  Location: [place];  Service: General;  Laterality: N/A;  Removal of abdominal wound vac, Application of incisional  wound vac  . Repair of aa  04/14/2013    done at [place] Encounter Prescriptions as of 08/19/2013  Medication Sig  . aspirin 81 MG tablet Take 81 mg by mouth daily.   [name] atenolol (TENORMIN) 25 MG tablet Take 1 tablet (25 mg total) by mouth daily as needed (if BP is over 110).  [name] atorvastatin (LIPITOR) 10 MG tablet Take 1 tablet (10 mg total) by mouth daily.  . Fluticasone-Salmeterol (ADVAIR DISKUS) 250-50 MCG/DOSE AEPB inhale 1 dose by mouth twice a day  . furosemide (LASIX) 40 MG tablet Take 1 tablet (40 mg total) by mouth daily.  [name] guaiFENesin (MUCINEX) 600 MG 12 hr tablet Take 600 mg by mouth 2 (two) times daily. COPD.  [name] levothyroxine (SYNTHROID, LEVOTHROID) 50 MCG tablet Take 1 tablet (50 mcg total) by mouth daily before breakfast.  . Multiple Vitamin (MULTIVITAMIN) tablet Take 1 tablet by  mouth daily.    . Tamsulosin HCl (FLOMAX) 0.4 MG CAPS Take 0.4 mg by mouth at bedtime.   [name] tiotropium (SPIRIVA HANDIHALER) 18 MCG inhalation capsule Place 1 capsule (18 mcg total) into inhaler and inhale daily.  [name] tiZANidine (ZANAFLEX) 4 MG capsule Take 1 capsule (4 mg total) by mouth 3 (three) times daily as needed for muscle spasms.  . traMADol (ULTRAM) 50 MG tablet Take 1 tablet (50 mg total) by mouth 3 (three) times daily as needed for pain.  [name] warfarin (COUMADIN) 2.5 MG tablet Take 1 tablet (2.5 mg total) by mouth as directed.    No Known Allergies   Current Medications, Allergies, Past Medical History, Past Surgical History, Family History, and Social History were reviewed in [employer] record.   Review of Systems        See HPI - all other systems neg except as noted...  The patient complains of dyspnea on exertion.  The patient denies anorexia, fever, weight loss, weight gain, vision loss, decreased hearing, hoarseness, chest pain, syncope, peripheral edema, prolonged cough, headaches, hemoptysis, abdominal pain, melena, hematochezia, severe indigestion/heartburn, hematuria, incontinence, muscle weakness, suspicious skin lesions, transient blindness, difficulty walking, depression, unusual weight change, abnormal bleeding, enlarged lymph nodes, and angioedema.     Objective:   Physical Exam    WD, WN, 79 y/o WM in NAD... GENERAL:  Alert &  oriented; pleasant & cooperative; pale complexion... HEENT:  Ryan Hess/AT, EOM-wnl, PERRLA, EACs-clear, TMs-wnl, NOSE-clear, THROAT-clear & wnl. NECK:  Supple w/ fairROM; no JVD; normal carotid impulses w/o bruits; no thyromegaly or nodules palpated; no lymphadenopathy. CHEST:  decr BS bilat, scat bibasilar rales, w/o wheezing/ rhonchi/ signs of consolidation. HEART:  irregular rhythm, gr1/6 diast murmur in Ao area, without rubs or gallops detected... ABDOMEN:  Soft & nontender; normal bowel sounds; no organomegaly or masses  palpated... EXT: s/p bilat TKR's, mod arthritic changes; no varicose veins/ +venous insuffic/ no edema. NEURO:  CN's intact;  gait abn; no focal neuro deficits... DERM:  No lesions noted; no rash etc...  RADIOLOGY DATA:  Reviewed in the EPIC EMR & discussed w/ the patient...  LABORATORY DATA:  Reviewed in the EPIC EMR & discussed w/ the patient...   Assessment:      COPD, Hx Pneumonia>  baseline severe COPD/Emphysema w/ Cardiomeg, Aneurysm & bibasilar fibrosis...  HBP>  controlled on Aten50-1/2, Lasix40, K10Bid; continue same...  CAD/ CHF/ etc>  Followed by Cherly Hensen & his prev notes are reviewed;  BNP is stable...  AFib>  On above + Coumadin followed in the [facility]...  Periph Vasc Dis>  S/p AAA repair 1998 by Sheryn Bison; then followed by DrGearhardt for thoraco-abd aneurysm & referred to [facility] for stent graft surg- done 4/13 by DrFarber w/ complications, and 2nd stage of the surgery 7/14 w/ 5 stents placed per pt...  Ven Insuffic & EDEMA>  Improved w/ Lasix40/d... He knows to elim salt, elev legs, wear support hose, etc...  CHOL>  FLP looks good on Lip10...  Subclinical Hypothyroid>  TSH was 6.21 and Synthroid50 started 5/14...  GI> Presbyesoph, Divertics, Hx polpyps>  Stable & followed by DrDBrodie... 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  Prostate Cancer>  Followed by DrWrenn  w/ slowly rising PSA as noted; then developed urinary retention after lumbar surg and hematuria after foley & coumadin; now back to baseline & being followed...  DJD>  Followed by DrOlin, s/p bilat TKRs...  ANEMIA>  He developed acute blood loss anemia w/ post op Hg= 11-12 but dropped to 8.6 w/ hematuria & INR=4.2; Coumadin & ASA placed on HOLD; f/u labs showed Hg=8.4 & Fe=31 & started on FeSO4 Bid;  Then Hg=9.5 & rec to continue Fe Bid;  Cards restarted his Coumadin;  Now Hg= 13.2 & Fe improved, ok to wean  off Fe...  Actinic skin changes>  Aware, he is monitored by Derm...     Plan:     Patient's Medications  New Prescriptions   No medications on file  Previous Medications   ASPIRIN 81 MG TABLET    Take 81 mg by mouth daily.    ATENOLOL (TENORMIN) 25 MG TABLET    Take 1 tablet (25 mg total) by mouth daily as needed (if BP is over 110).   ATORVASTATIN (LIPITOR) 10 MG TABLET    Take 1 tablet (10 mg total) by mouth daily.   FLUTICASONE-SALMETEROL (ADVAIR DISKUS) 250-50 MCG/DOSE AEPB    inhale 1 dose by mouth twice a day   FUROSEMIDE (LASIX) 40 MG TABLET    Take 1 tablet (40 mg total) by mouth daily.   GUAIFENESIN (MUCINEX) 600 MG 12 HR TABLET    Take 600 mg by mouth 2 (two) times daily. COPD.   LEVOTHYROXINE (SYNTHROID, LEVOTHROID) 50 MCG TABLET    Take 1 tablet (50 mcg  total) by mouth daily before breakfast.   MULTIPLE VITAMIN (MULTIVITAMIN) TABLET    Take 1 tablet by mouth daily.     TAMSULOSIN HCL (FLOMAX) 0.4 MG CAPS    Take 0.4 mg by mouth at bedtime.    TIOTROPIUM (SPIRIVA HANDIHALER) 18 MCG INHALATION CAPSULE    Place 1 capsule (18 mcg total) into inhaler and inhale daily.   TRAMADOL (ULTRAM) 50 MG TABLET    Take 1 tablet (50 mg total) by mouth 3 (three) times daily as needed for pain.   WARFARIN (COUMADIN) 2.5 MG TABLET    Take 1 tablet (2.5 mg total) by mouth as directed.  Modified Medications   Modified Medication Previous Medication   TIZANIDINE (ZANAFLEX) 4 MG CAPSULE tiZANidine (ZANAFLEX) 4 MG capsule      Take 1 capsule (4 mg total) by mouth 3 (three) times daily as needed for muscle spasms.    Take 1 capsule (4 mg total) by mouth 3 (three) times daily as needed for muscle spasms.  Discontinued Medications   No medications on file

## 2013-08-29 ENCOUNTER — Ambulatory Visit (INDEPENDENT_AMBULATORY_CARE_PROVIDER_SITE_OTHER): Payer: Medicare Other | Admitting: Pharmacist

## 2013-08-29 DIAGNOSIS — I4891 Unspecified atrial fibrillation: Secondary | ICD-10-CM

## 2013-08-29 LAB — POCT INR: INR: 5.8

## 2013-09-01 ENCOUNTER — Other Ambulatory Visit: Payer: Self-pay | Admitting: *Deleted

## 2013-09-01 MED ORDER — FUROSEMIDE 40 MG PO TABS: 40.0000 mg | ORAL_TABLET | Freq: Every day | ORAL | Status: DC

## 2013-09-05 ENCOUNTER — Other Ambulatory Visit: Payer: Self-pay

## 2013-09-05 MED ORDER — ATENOLOL 25 MG PO TABS: 25.0000 mg | ORAL_TABLET | Freq: Every day | ORAL | Status: DC | PRN

## 2013-09-06 ENCOUNTER — Emergency Department (HOSPITAL_COMMUNITY): Payer: Medicare Other

## 2013-09-06 ENCOUNTER — Encounter (HOSPITAL_COMMUNITY): Payer: Self-pay | Admitting: Emergency Medicine

## 2013-09-06 ENCOUNTER — Emergency Department (HOSPITAL_COMMUNITY)
Admission: EM | Admit: 2013-09-06 | Discharge: 2013-09-07 | Disposition: A | Discharge status: Home / Self Care | Payer: Medicare Other | Attending: Emergency Medicine | Admitting: Emergency Medicine

## 2013-09-06 DIAGNOSIS — Z8546 Personal history of malignant neoplasm of prostate: Secondary | ICD-10-CM | POA: Insufficient documentation

## 2013-09-06 DIAGNOSIS — I509 Heart failure, unspecified: Secondary | ICD-10-CM | POA: Insufficient documentation

## 2013-09-06 DIAGNOSIS — R091 Pleurisy: Secondary | ICD-10-CM | POA: Insufficient documentation

## 2013-09-06 DIAGNOSIS — Z79899 Other long term (current) drug therapy: Secondary | ICD-10-CM | POA: Insufficient documentation

## 2013-09-06 DIAGNOSIS — J441 Chronic obstructive pulmonary disease with (acute) exacerbation: Secondary | ICD-10-CM | POA: Insufficient documentation

## 2013-09-06 DIAGNOSIS — Z7982 Long term (current) use of aspirin: Secondary | ICD-10-CM | POA: Insufficient documentation

## 2013-09-06 DIAGNOSIS — Z8701 Personal history of pneumonia (recurrent): Secondary | ICD-10-CM | POA: Insufficient documentation

## 2013-09-06 DIAGNOSIS — Z87891 Personal history of nicotine dependence: Secondary | ICD-10-CM | POA: Insufficient documentation

## 2013-09-06 DIAGNOSIS — Z792 Long term (current) use of antibiotics: Secondary | ICD-10-CM | POA: Insufficient documentation

## 2013-09-06 DIAGNOSIS — I10 Essential (primary) hypertension: Secondary | ICD-10-CM | POA: Insufficient documentation

## 2013-09-06 DIAGNOSIS — Z860100 Personal history of colon polyps, unspecified: Secondary | ICD-10-CM | POA: Insufficient documentation

## 2013-09-06 DIAGNOSIS — R071 Chest pain on breathing: Secondary | ICD-10-CM | POA: Insufficient documentation

## 2013-09-06 DIAGNOSIS — E785 Hyperlipidemia, unspecified: Secondary | ICD-10-CM | POA: Insufficient documentation

## 2013-09-06 DIAGNOSIS — Z7901 Long term (current) use of anticoagulants: Secondary | ICD-10-CM | POA: Insufficient documentation

## 2013-09-06 DIAGNOSIS — M1990 Unspecified osteoarthritis, unspecified site: Secondary | ICD-10-CM | POA: Insufficient documentation

## 2013-09-06 DIAGNOSIS — I4891 Unspecified atrial fibrillation: Secondary | ICD-10-CM | POA: Insufficient documentation

## 2013-09-06 DIAGNOSIS — R079 Chest pain, unspecified: Secondary | ICD-10-CM

## 2013-09-06 LAB — POCT I-STAT TROPONIN I: Troponin i, poc: 0 ng/mL (ref 0.00–0.08)

## 2013-09-06 LAB — BASIC METABOLIC PANEL
BUN: 26 mg/dL — ABNORMAL HIGH (ref 6–23)
CO2: 28 mEq/L (ref 19–32)
Calcium: 9.4 mg/dL (ref 8.4–10.5)
Chloride: 100 mEq/L (ref 96–112)
Creatinine, Ser: 1.26 mg/dL (ref 0.50–1.35)
GFR calc Af Amer: 57 mL/min — ABNORMAL LOW (ref >90)
GFR calc non Af Amer: 49 mL/min — ABNORMAL LOW (ref >90)
Glucose, Bld: 123 mg/dL — ABNORMAL HIGH (ref 70–99)
Potassium: 3.4 mEq/L — ABNORMAL LOW (ref 3.5–5.1)
Sodium: 141 mEq/L (ref 135–145)

## 2013-09-06 LAB — CBC
HCT: 38.7 % — ABNORMAL LOW (ref 39.0–52.0)
Hemoglobin: 13.2 g/dL (ref 13.0–17.0)
MCH: 31.6 pg (ref 26.0–34.0)
MCHC: 34.1 g/dL (ref 30.0–36.0)
MCV: 92.6 fL (ref 78.0–100.0)
Platelets: 182 10*3/uL (ref 150–400)
RBC: 4.18 MIL/uL — ABNORMAL LOW (ref 4.22–5.81)
RDW: 16 % — ABNORMAL HIGH (ref 11.5–15.5)
WBC: 9.7 10*3/uL (ref 4.0–10.5)

## 2013-09-06 LAB — PROTIME-INR
INR: 2.27 — ABNORMAL HIGH (ref 0.00–1.49)
Prothrombin Time: 24.3 seconds — ABNORMAL HIGH (ref 11.6–15.2)

## 2013-09-06 MED ORDER — MORPHINE SULFATE 4 MG/ML IJ SOLN
6.0000 mg | Freq: Once | INTRAMUSCULAR | Status: AC
  Administered 2013-09-07: 6 mg via INTRAVENOUS
  Filled 2013-09-06: qty 2

## 2013-09-06 MED ORDER — ASPIRIN 325 MG PO TABS
325.0000 mg | ORAL_TABLET | ORAL | Status: AC
  Administered 2013-09-06: 325 mg via ORAL
  Filled 2013-09-06 (×2): qty 1

## 2013-09-06 NOTE — ED Notes (Signed)
Per ems, pt eating dinner three hours ago, sudden onset of right sided chest pain. No tenderness with palpation, 10/10 when breathing or moving. Diminished lung sounds. Constant, non radiating. Hx of three stents, AAA repaired, bowel obstruction. Irregular HR, a fib on 12 lead, with PVCs.

## 2013-09-07 ENCOUNTER — Emergency Department (HOSPITAL_COMMUNITY): Payer: Medicare Other

## 2013-09-07 LAB — PRO B NATRIURETIC PEPTIDE: Pro B Natriuretic peptide (BNP): 4267 pg/mL — ABNORMAL HIGH (ref 0.0–100.0)

## 2013-09-07 MED ORDER — IBUPROFEN 600 MG PO TABS: 600.0000 mg | ORAL_TABLET | Freq: Three times a day (TID) | ORAL | Status: DC | PRN

## 2013-09-07 MED ORDER — IOHEXOL 350 MG/ML SOLN
100.0000 mL | Freq: Once | INTRAVENOUS | Status: AC | PRN
  Administered 2013-09-07: 100 mL via INTRAVENOUS

## 2013-09-07 MED ORDER — MORPHINE SULFATE 4 MG/ML IJ SOLN
6.0000 mg | Freq: Once | INTRAMUSCULAR | Status: AC
  Administered 2013-09-07: 6 mg via INTRAVENOUS
  Filled 2013-09-07: qty 2

## 2013-09-07 MED ORDER — HYDROCODONE-ACETAMINOPHEN 5-325 MG PO TABS: 1.0000 | ORAL_TABLET | ORAL | Status: DC | PRN

## 2013-09-07 MED ORDER — AZITHROMYCIN 250 MG PO TABS: 250.0000 mg | ORAL_TABLET | Freq: Every day | ORAL | Status: DC

## 2013-09-07 MED ORDER — KETOROLAC TROMETHAMINE 30 MG/ML IJ SOLN
30.0000 mg | Freq: Once | INTRAMUSCULAR | Status: AC
  Administered 2013-09-07: 30 mg via INTRAVENOUS
  Filled 2013-09-07: qty 1

## 2013-09-07 NOTE — ED Provider Notes (Signed)
CSN: [id number]     Arrival date & time 09/06/13  2236 History   First MD Initiated Contact with Patient 09/06/13 2302     Chief Complaint  Patient presents with  . Chest Pain    The history is provided by the patient.   patient presents emergency department for worsening right-sided chest pain over the past 8 hours.  He states this worsened while he was eating dinner.  Worse by palpation of his right anterior chest and worse when he takes a deep breath.  No history of DVT or pulmonary embolism.  The patient is on Coumadin.  He has a history of thoracic aortic aneurysm repair.  He denies any back pain or shoulder pain.  No jaw pain.  No fevers or chills.  No recent productive cough.  No unilateral leg swelling.  No recent travel.  Past Medical History  Diagnosis Date  . COPD (chronic obstructive pulmonary disease)   . Pneumonia, organism unspecified   . Pulmonary nodule   . HTN (hypertension)   . CHF (congestive heart failure)   . Atrial fibrillation   . Drug therapy   . PVC (premature ventricular contraction)   . PVD (peripheral vascular disease)   . Thoracic aortic aneurysm   . Hypercholesteremia   . Diverticulosis of colon   . Colon polyps   . Prostate cancer   . DJD (degenerative joint disease)   . Actinic skin damage   . Hyperlipidemia   . Internal hemorrhoids    Past Surgical History  Procedure Laterality Date  . Total knee arthroplasty      bilateral  . Abdominal aortic aneurysm repair  1998  . Appendectomy    . Inguinal hernia repair      left  . Joint replacement    . Laparotomy  04/16/2012    Procedure: EXPLORATORY LAPAROTOMY;  Surgeon: Madilyn Hook, DO;  Location: [place];  Service: General;  Laterality: N/A;  exploratory Laparotomy,Small bowel ressection abdominal wound vac placement.  . Laparotomy  04/18/2012    Procedure: EXPLORATORY LAPAROTOMY;  Surgeon: Joyice Faster. Cornett, MD;  Location: [place];  Service: General;  Laterality: N/A;  exploratory laparotomy with  small bowel anastomosis  . Application of wound vac  04/18/2012    Procedure: APPLICATION OF WOUND VAC;  Surgeon: Joyice Faster. Cornett, MD;  Location: [place];  Service: General;  Laterality: N/A;  Removal of abdominal wound vac, Application of incisional  wound vac  . Repair of aa  04/14/2013    done at [place] History  Problem Relation Age of Onset  . Dementia Mother   . Stroke Father   . Diabetes Brother   . Heart disease Brother   . Heart disease Sister   . Heart disease Brother   . Obesity Brother   . Cancer Sister     back ??  . Alzheimer's disease Sister   . Colon cancer Neg Hx    History  Substance Use Topics  . Smoking status: Former Smoker -- 2.00 packs/day for 44 years    Types: Cigarettes    Quit date: 05/15/1982  . Smokeless tobacco: Never Used  . Alcohol Use: No    Review of Systems  All other systems reviewed and are negative.    Allergies  Review of patient's allergies indicates no known allergies.  Home Medications   Current Outpatient Rx  Name  Route  Sig  Dispense  Refill  . aspirin 81 MG tablet  Oral   Take 81 mg by mouth daily.          [name] atenolol (TENORMIN) 25 MG tablet   Oral   Take 1 tablet (25 mg total) by mouth daily as needed (if BP is over 110).   30 tablet   0     PATIENT NEEDS TO CALL OUR OFFICE TO SCHEDULE A FOL ...   . atorvastatin (LIPITOR) 10 MG tablet   Oral   Take 1 tablet (10 mg total) by mouth daily.   90 tablet   3   . azithromycin (ZITHROMAX Z-PAK) 250 MG tablet   Oral   Take 1 tablet (250 mg total) by mouth daily. Take 2 tabs for first dose, then 1 tab for each additional dose   6 tablet   0   . Fluticasone-Salmeterol (ADVAIR DISKUS) 250-50 MCG/DOSE AEPB      inhale 1 dose by mouth twice a day   180 each   3   . furosemide (LASIX) 40 MG tablet   Oral   Take 1 tablet (40 mg total) by mouth daily.   14 tablet   0   . guaiFENesin (MUCINEX) 600 MG 12 hr tablet   Oral   Take 600 mg by mouth 2  (two) times daily. COPD.         [name] HYDROcodone-acetaminophen (NORCO/VICODIN) 5-325 MG per tablet   Oral   Take 1 tablet by mouth every 4 (four) hours as needed for moderate pain.   15 tablet   0   . ibuprofen (ADVIL,MOTRIN) 600 MG tablet   Oral   Take 1 tablet (600 mg total) by mouth every 8 (eight) hours as needed.   15 tablet   0   . levothyroxine (SYNTHROID, LEVOTHROID) 50 MCG tablet   Oral   Take 1 tablet (50 mcg total) by mouth daily before breakfast.   90 tablet   3   . Multiple Vitamin (MULTIVITAMIN) tablet   Oral   Take 1 tablet by mouth daily.           . Tamsulosin HCl (FLOMAX) 0.4 MG CAPS   Oral   Take 0.4 mg by mouth at bedtime.          [name] tiotropium (SPIRIVA HANDIHALER) 18 MCG inhalation capsule   Inhalation   Place 1 capsule (18 mcg total) into inhaler and inhale daily.   90 capsule   3   . tiZANidine (ZANAFLEX) 4 MG capsule   Oral   Take 1 capsule (4 mg total) by mouth 3 (three) times daily as needed for muscle spasms.   90 capsule   5   . traMADol (ULTRAM) 50 MG tablet   Oral   Take 1 tablet (50 mg total) by mouth 3 (three) times daily as needed for pain.   90 tablet   3   . warfarin (COUMADIN) 2.5 MG tablet   Oral   Take 1 tablet (2.5 mg total) by mouth as directed.   90 tablet   1     90 day supply    BP 136/84  Pulse 45  Temp(Src) 97.8 F (36.6 C)  Resp 17  Ht '5\' 7"'$  (1.702 m)  Wt 175 lb (79.379 kg)  BMI 27.40 kg/m2  SpO2 99% Physical Exam  Nursing note and vitals reviewed. Constitutional: He is oriented to person, place, and time. He appears well-developed and well-nourished.  HENT:  Head: Normocephalic and atraumatic.  Eyes: EOM are normal.  Neck: Normal  range of motion.  Cardiovascular: Normal rate, regular rhythm, normal heart sounds and intact distal pulses.   Pulmonary/Chest: Effort normal and breath sounds normal. No respiratory distress.  Tenderness to anterior right chest wall without rash  Abdominal: Soft. He  exhibits no distension. There is no tenderness.  Musculoskeletal: Normal range of motion.  Neurological: He is alert and oriented to person, place, and time.  Skin: Skin is warm and dry.  Psychiatric: He has a normal mood and affect. Judgment normal.    ED Course  Procedures (including critical care time) Labs Review Labs Reviewed  CBC - Abnormal; Notable for the following:    RBC 4.18 (*)    HCT 38.7 (*)    RDW 16.0 (*)    All other components within normal limits  BASIC METABOLIC PANEL - Abnormal; Notable for the following:    Potassium 3.4 (*)    Glucose, Bld 123 (*)    BUN 26 (*)    GFR calc non Af Amer 49 (*)    GFR calc Af Amer 57 (*)    All other components within normal limits  PRO B NATRIURETIC PEPTIDE - Abnormal; Notable for the following:    Pro B Natriuretic peptide (BNP) 4267.0 (*)    All other components within normal limits  PROTIME-INR - Abnormal; Notable for the following:    Prothrombin Time 24.3 (*)    INR 2.27 (*)    All other components within normal limits  POCT I-STAT TROPONIN I   Imaging Review Dg Chest 2 View  09/07/2013   CLINICAL DATA:  Chest pain with shortness of breath.  EXAM: CHEST  2 VIEW  COMPARISON:  06/30/2013.  FINDINGS: Large aortic stent graft in the descending thoracic aorta. Stable cardiomegaly. No active infiltrates or failure. Slight midline subsegmental atelectatic change slightly increased from priors. No effusion or pneumothorax. Degenerative change both shoulders.  IMPRESSION: No active infiltrates or failure. Cardiomegaly with previous stent graft repair of the thoracic aorta. Slight increase right midlung zone atelectatic change.   Electronically Signed   By: Rolla Flatten M.D.   On: 09/07/2013 00:03   Ct Angio Chest W/cm &/or Wo Cm  09/07/2013   CLINICAL DATA:  Chest pain. History of aortic aneurysm status post repair.  EXAM: CT ANGIOGRAPHY CHEST WITH CONTRAST  TECHNIQUE: Multidetector CT imaging of the chest was performed using  the standard protocol during bolus administration of intravenous contrast. Multiplanar CT image reconstructions including MIPs were obtained to evaluate the vascular anatomy.  CONTRAST:  116m OMNIPAQUE IOHEXOL 350 MG/ML SOLN  COMPARISON:  Chest radiograph performed 09/06/2013, and CTA of the chest performed 02/03/2012  FINDINGS: There is no evidence of pulmonary embolus. There is mildly asymmetric prominence of the right pulmonary artery, grossly unchanged from 2013.  Diffuse bilateral emphysematous change is seen, with associated scarring. Bibasilar airspace opacities may reflect atelectasis or pneumonia. Scattered nodular densities bilaterally are difficult to characterize given underlying scarring. There is no evidence of pleural effusion or pneumothorax.  As noted on the prior CTA of the chest, there is aneurysmal dilatation of the descending thoracic aorta and proximal abdominal aorta, with placement of an associated stent graft. The stent graft is unchanged in position. The distal aortic arch measures approximately 4.4 cm in diameter, the descending thoracic aorta measures 5.3 cm in diameter, and the proximal abdominal aorta measures 6.5 cm in AP dimension and 6.7 cm in transverse dimension, slightly increased in size from 2013. Given the phase of contrast enhancement on this  study, the patency of the stent graft and the nature of surrounding thrombus is not assessed on these studies. Celiac trunk and superior mesenteric artery stents are partially imaged.  Mildly enlarged right paratracheal nodes are seen measuring up to 1.4 cm in short axis. These are grossly stable from 2013 and likely reflect patient's baseline. Trace pericardial fluid remains within normal limits. Diffuse coronary artery calcifications are seen. Biatrial enlargement is noted. The great vessels are grossly unremarkable in appearance. No axillary lymphadenopathy is seen. The thyroid gland is unremarkable in appearance.  The visualized  portions of the liver and spleen are unremarkable, aside from a calcified granuloma adjacent to the gallbladder fossa. A 4.5 cm right renal cyst is partially imaged.  No acute osseous abnormalities are seen. Mild degenerative change is noted along the lower cervical spine.  Review of the MIP images confirms the above findings.  IMPRESSION: 1. No evidence of pulmonary embolus. 2. Aneurysmal dilatation of the descending thoracic aorta and proximal abdominal aorta again noted, with an associated stent graft. The stent graft is unchanged in position. The distal aortic arch measures 4.4 cm in diameter, the descending thoracic aorta measures 5.3 cm in diameter, and the proximal abdominal aorta measures 6.5 cm in AP dimension and 6.7 cm in transverse dimension, slightly increased in size from 2013. The patency of the stent graft and surrounding thrombus are not well assessed given the phase of contrast enhancement on this study. Celiac trunk and superior mesenteric artery stents are partially imaged. 3. Bibasilar airspace opacities may reflect atelectasis or pneumonia. 4. Diffuse bilateral emphysematous change seen, with associated scarring. 5. Mildly enlarged right paratracheal nodes again noted, measuring up to 1.4 cm in short axis. These appear grossly stable from 2013 and likely reflect the patient's baseline. 6. Right atrial enlargement noted. 7. Diffuse coronary artery calcifications seen. 8. Right renal cyst noted.   Electronically Signed   By: Garald Balding M.D.   On: 09/07/2013 01:58  I personally reviewed the imaging tests through PACS system I reviewed available ER/hospitalization records through the EMR   EKG Interpretation    Date/Time:  Saturday September 06 2013 22:44:36 EST Ventricular Rate:  100 PR Interval:    QRS Duration: 98 QT Interval:  398 QTC Calculation: 513 R Axis:   -46 Text Interpretation:  Age not entered, assumed to be  79 years old for purpose of ECG interpretation Atrial  fibrillation Paired ventricular premature complexes Left anterior fascicular block Borderline repolarization abnormality No significant change was found Confirmed by Tandy Grawe  MD, Aretha Levi ([id number]) on 09/06/2013 11:00:40 PM            MDM   1. Chest pain   2. Pleurisy    This appears to have right-sided pleurisy.  Patient be covered for pneumonia with azithromycin given questionable infiltrates versus atelectasis.  My suspicion is much higher that this is pleurisy with probably associated atelectasis.  Home with PCP/pulmonary followup.  Discharge home in good condition.  She is return to the ER for new or worsening symptoms.    Hoy Morn, MD 09/07/13 [phone]

## 2013-09-07 NOTE — ED Notes (Signed)
Pt placed on 2L [place] due to o2 sats being 88%.

## 2013-09-08 ENCOUNTER — Telehealth: Payer: Self-pay | Admitting: Pulmonary Disease

## 2013-09-08 NOTE — Telephone Encounter (Signed)
lmomtcb x1 

## 2013-09-09 ENCOUNTER — Telehealth: Payer: Self-pay | Admitting: Pulmonary Disease

## 2013-09-09 NOTE — Telephone Encounter (Signed)
Pt is returning call & can be reached [phone].  Ryan Hess

## 2013-09-09 NOTE — Telephone Encounter (Signed)
lmtcb x2 

## 2013-09-09 NOTE — Telephone Encounter (Signed)
Son returning call.  [phone]

## 2013-09-09 NOTE — Telephone Encounter (Signed)
Spoke to pt. He doesn't know anything about needing this medication refilled. States that his son takes care of his medications for him. Advised him that we can not refill this medication because it's not on his medication list. Pt will have his son call back about this.

## 2013-09-09 NOTE — Telephone Encounter (Signed)
Called and spoke with pharmacy and they have never filled the losartan for the pt, or the potassium.  Pt had been using mail order for these meds.    Called and spoke with pts son and he stated that the pt had stopped the losartan a while back but after his last operation, he stated that his BP was elevated too high for him, so they started back on the losartan along with the potassium, lasix and the atenolol.  pts son stated that he will need a refill of this medication if he is to stay on this, and that they forgot to tell SN at his last ov.   He is aware of appt with SN on Thursday and that they will need to bring in all of his meds at that time.  SN please advise if you want to wait to fill meds.  Thanks  No Known Allergies

## 2013-09-11 ENCOUNTER — Encounter: Payer: Self-pay | Admitting: Cardiovascular Disease

## 2013-09-11 ENCOUNTER — Encounter: Payer: Self-pay | Admitting: Pulmonary Disease

## 2013-09-11 ENCOUNTER — Ambulatory Visit (INDEPENDENT_AMBULATORY_CARE_PROVIDER_SITE_OTHER): Payer: Medicare Other | Admitting: Cardiovascular Disease

## 2013-09-11 ENCOUNTER — Ambulatory Visit (INDEPENDENT_AMBULATORY_CARE_PROVIDER_SITE_OTHER): Payer: Medicare Other | Admitting: Pulmonary Disease

## 2013-09-11 VITALS — BP 120/60 | HR 78 | Temp 97.0°F | Ht 69.0 in | Wt 175.0 lb

## 2013-09-11 VITALS — BP 110/60 | HR 80 | Ht 69.0 in | Wt 175.8 lb

## 2013-09-11 DIAGNOSIS — D126 Benign neoplasm of colon, unspecified: Secondary | ICD-10-CM

## 2013-09-11 DIAGNOSIS — I10 Essential (primary) hypertension: Secondary | ICD-10-CM

## 2013-09-11 DIAGNOSIS — I5022 Chronic systolic (congestive) heart failure: Secondary | ICD-10-CM

## 2013-09-11 DIAGNOSIS — I712 Thoracic aortic aneurysm, without rupture, unspecified: Secondary | ICD-10-CM

## 2013-09-11 DIAGNOSIS — I739 Peripheral vascular disease, unspecified: Secondary | ICD-10-CM

## 2013-09-11 DIAGNOSIS — I4891 Unspecified atrial fibrillation: Secondary | ICD-10-CM

## 2013-09-11 DIAGNOSIS — R5381 Other malaise: Secondary | ICD-10-CM

## 2013-09-11 DIAGNOSIS — I714 Abdominal aortic aneurysm, without rupture, unspecified: Secondary | ICD-10-CM

## 2013-09-11 DIAGNOSIS — E7800 Pure hypercholesterolemia, unspecified: Secondary | ICD-10-CM

## 2013-09-11 DIAGNOSIS — J449 Chronic obstructive pulmonary disease, unspecified: Secondary | ICD-10-CM

## 2013-09-11 DIAGNOSIS — M1990 Unspecified osteoarthritis, unspecified site: Secondary | ICD-10-CM

## 2013-09-11 DIAGNOSIS — C61 Malignant neoplasm of prostate: Secondary | ICD-10-CM

## 2013-09-11 DIAGNOSIS — K5900 Constipation, unspecified: Secondary | ICD-10-CM

## 2013-09-11 DIAGNOSIS — J4489 Other specified chronic obstructive pulmonary disease: Secondary | ICD-10-CM

## 2013-09-11 DIAGNOSIS — I4949 Other premature depolarization: Secondary | ICD-10-CM

## 2013-09-11 DIAGNOSIS — E039 Hypothyroidism, unspecified: Secondary | ICD-10-CM

## 2013-09-11 DIAGNOSIS — M545 Low back pain, unspecified: Secondary | ICD-10-CM

## 2013-09-11 DIAGNOSIS — J189 Pneumonia, unspecified organism: Secondary | ICD-10-CM

## 2013-09-11 DIAGNOSIS — K5730 Diverticulosis of large intestine without perforation or abscess without bleeding: Secondary | ICD-10-CM

## 2013-09-11 MED ORDER — TAMSULOSIN HCL 0.4 MG PO CAPS: 0.4000 mg | ORAL_CAPSULE | Freq: Every day | ORAL | Status: DC

## 2013-09-11 MED ORDER — HYDROCODONE-ACETAMINOPHEN 5-325 MG PO TABS: 1.0000 | ORAL_TABLET | Freq: Three times a day (TID) | ORAL | Status: DC | PRN

## 2013-09-11 MED ORDER — LOSARTAN POTASSIUM 100 MG PO TABS: 100.0000 mg | ORAL_TABLET | Freq: Every day | ORAL | Status: DC

## 2013-09-11 MED ORDER — FUROSEMIDE 40 MG PO TABS: 40.0000 mg | ORAL_TABLET | Freq: Every day | ORAL | Status: DC

## 2013-09-11 NOTE — Assessment & Plan Note (Signed)
S/P stenting with no pain or palpable mass

## 2013-09-11 NOTE — Progress Notes (Signed)
Subjective:     Patient ID: Ryan Hess, male   DOB: [birth year], 79 y.o.   MRN: [id number]  HPI 79 y/o WM here for a follow up visit... he has multiple medical problems as noted below...    ~  March 22, 2012:  59moROV & GSpartacushas been through a lot >> In Feb2013 he was [facility]- starting 5cm distal to the left subclavian down to the prox aspect of there prev [name] graft;  They chose to proceed w/ a fenestrated endovasc repair> done 01/01/12;  Disch on Coumadin for his AFib & Lovenox bridge but he developed a complic w/ lumbar epidural hematomas w/ paraplegia requiring readmission for laminectomy & evac of the hematomas;  [facility] and then [facility] but only spent 4d there [facility] w/ altered mental status, pneumonia, AFib w/ rvr, & FTT> Neuro w/u was otherw neg (& he improved w/ supportive care); CXR showed L>R bibasilar airsp dis (covered w/ IV antibiotics & disch on Avelox); AFib was rate controlled w/ med adjustments & Coumadin continued; Urinary retension required foley cath & Urology outpt management; his protimes got too thin & he developed hematuria & acute blood loss anemia; now BP has been low necessitating a HOLD on his BP med...  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  Bilat lower lobe pneumonias treated in-pt 5/13 and disch on Avelox > He is afeb & starting to feel better, actually looks pretty good (just pale); CXR today showed resolution of infiltrates...  AFib w/ RVR> meds adjusted and Coumadin carefully monitored; Heart rate= 62 off Atenolol; Protime=13.6/1.2 Off Coumadin x  several days now; decision will need to be made regarding restart coumadin when safe...  Urinary retension from the lumbar surg and hematuria developing while on coumadin > he has foley cath & leg bag w/ clear yellow urine at present; appt to see DrWrenn in 3d to consider further options (?voiding trial, in&out cath, etc)...  Hypotension w/ weakness & required down titration in BP meds & careful monitoring of BP etc > currently on Lasix40, K10Bid; BP= 118/66 and has varied 90's/70's to 120's/80's at home OFF his prev Aten25 & Losartan50;  Chem's are WNL & BNP=552...  Anemia > He developed acute blood loss anemia w/ post op Hg= 11-12 but dropped to 8.6 w/ hematuria & INR=4.2; Coumadin & ASA placed on HOLD; f/u labs showed Hg=8.4, Fe=31 (11%sat);  Rec to start FeSO4 Bid now...  Complaints of constipation > we reviewed the need for MIRALAX 17gm in water daily, and SENAKOT-S 2tabs at bedtime... We reviewed prob list, meds, xrays and labs> CXR 6/13 showed stable cardiac silhouette, sl incr markings chronically, NAD, no edema, Ao stent in desc Ao appears stable... LABS 6/13:  Chems- ok w/ BUN=22 Creat=1.4 BNP=552;  CBC- Hg=8.4 Fe=31(11%sat) B12=953;  Protime=13.6/ INR1.2;  TSH=6.66  ~  May 03, 2012:  6wk ROV & post hosp follow up> GYaasirpresented to the ER 7/23 - 04/25/12 w/ abd pain- ELap surg revealed ~5 feet of frankly ischemic small bowel, likely due to internal hernia and closed loop obstruction=> Bowel resected &  pulses intact w/ no evidence of embolic or thrombotic pathology;  Viable small bowel anastomosed & wound vac applied; he had a stable post op course=> foley left in place;  Disch 8/1 w/ home health etc...    Since disch we had call from son regarding pts Cardiac meds and BP> when last seen his BP was low & he was weak- Aten & Losartan placed on hold, continued on Lasix40, he's been off his Coumadin since 6/13 w/ hematuria/ anemia=> hasn't yet been restarted & only covered transiently in hosp  w/ Lovenox, disch on ASA81, no coumadin;  BP since disch was still low (95/70) so Aten, Losartan kept on [employer] continued...     He is pale & weak but gaining strength he says;  He saw Urology 8/8 w/ adeq voiding trial & Foley removed, voiding satis so far;  Since disch he's noted some pitting edema in LEs and L>R arm swelling (he had left IJ line in hosp)=> we decided to check VenDopplers ASAP & prelim report indicates no subclavian clots;  Needs Cards follow up & decision regarding restart of Coumadin w/ careful protime monitoring...  We reviewed prob list, meds, xrays and labs >> LABS 7/13 in hosp near disch> Hg=8.2, MCV=93, BS=ok, K=3.3, Creat=0.85, Alb=3.2, LFTs=wnl... CXR 7/13 showed cardiomeg, intubated, low lung vols & bilat LL airsp dis L>R... 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  ~  May 23, 2012:  3wk ROV & Anibal's swelling is diminished & his weight is down 13# to 173# today on Lasix40 & K10Bid;  BP= 112/70 & his strength is sl better w/ Losartan50 on HOLD & son is giving him Aten25 only if BP>110 sys... NOTE> he has restarted the ASA'81mg'$ /d but his COUMADIN is still on hold & pending Cardiology appt next week (Off Coumadin since 6/13 due to hematuria, then abd pain/ ELap, etc; he has had several coumadin complications)...    We reviewed prob list, meds, xrays and labs> see below for updates >>  ~  July 08, 2012:  6wk ROV & Axxel is stable, slowly gaining strength, wt up 3# to 176# today, BP well regulated on his Lasix40 & takes the Aten25 only if BP>110;  Breathing stable on Advair250 + Spiriva & he uses the Mucinex as needed;  Due for f/u CBC & Fe on his FeSO4 '325mg'$ Bid...    We reviewed prob list, meds, xrays and labs> see below for updates >> OK Flu vaccine today... LABS 10/13:  Chems- wnl;  CBC- improved w/ Hg=13.2 & Fe looks good etc...  ~  October 07, 2012:  23moROV & GAlyjahis much better, stable, just c/o some back discomfort;  We reviewed the following medical problems during today's office visit >>     COPD, HxPneumonia> on Advair250, Spiriva, Mucinex; last CT 12/12 w/ severe emphysema & scarring, plus his extensive atherosclerotic dis/ ectatic thorAo/ aneurysm/ etc; his breathing is stable- mild cough, min sput, no hemoptysis, stable DOE, etc...     HBP, CHF, AFib w/ hx clot in LA appendage- resolved on Coumadin> on ASA81, Aten25, Lasix40, K10Bid; followed by DBurkina Fasofor Cards- seen 12/13 & his note is reviewed, no changes made...    Peripheral Vasc Dis> on ASA81 & Coumadin; followed by DrGearhardt [facility]Surg  [facility]see extensive hx below)...  Chol> on Lip10; last FLP 12/12 showed TChol 107, TG 38, HDL 40, LDL 60    GI- Divertics, Polyps, Hx ischemic bowel> the latter was due to an internal hernia & closed loop obstruction & not from any thrombotic or embolic phenomenon; he has done well since the surgery...    GU- prostate cancer> followed by DrWrenn & he elected XRT- admin by DrKinard in 2009; he has a slowly rising PSa w/ a doubling time of 6-84mo he is considerinjg androgen ablation if necessary...    DJD> on Oxy-IR '5mg'$  prn; he is s/p bilat TKRs by DrOlin    Hx Anemia> on Fe &VI- Hg increased from 9.5 post op to 13.2 w/ Fe=70 (21% sat) in Oct2013... We reviewed prob list, meds, xrays and labs> see below for updates >>  CXR 1/14 showed cardiomegaly, aortic stent stable, clear lungs w/ basilar scarring, NAD...    ~  Feb 04, 2013:  416moOV & GeTarrells awaiting a [facility] in his planned thoracoabd aneurysm repair- he had his situation reviewed by DrBrabham here & [facility] since this needs to be done endovascularly & is quite complicated... We reviewed the following medical problems during today's office visit >>     COPD, HxPneumonia> on  Advair250, Spiriva, Mucinex; last CT 12/12 w/ severe emphysema & scarring, plus his extensive atherosclerotic dis/ ectatic thorAo/ aneurysm/ etc; his breathing is stable- mild cough, min sput, no hemoptysis, stable DOE, etc...     HBP, CHF, AFib w/ hx clot in LA appendage- resolved on Coumadin> on ASA81, Aten25, Lasix40, K10Bid; BP= 114/72 & he denies CP, palpit etc; followed by DrCherly Hensenor Cards- his notes are reviewed, stable- no changes made...    Peripheral Vasc Dis> on ASA81 & Coumadin; [facility] AAA repair by DrTallaboa Altahe had a Thoracoabd Ao Aneurysm from the subclavian down to the prev Ao-bi-iliac graft, they decided on a fenestrated endovascular repair & a staged procedure- 4/13 they did the Thoracic endovasc aneurysm repair & he developed mult post op complications (see above 03/22/12 entry)... DrFarbers note indicates that to finish the repair he will require 3 branches and a left renal fenestration, they quoted him a risk of paraplegia in the 10-15% range & he will need preadmission for a lumbar drain to be placed, etc; [facility]    Chol> on Lip10; last FLP 12/12 showed TChol 107, TG 38, HDL 40, LDL 60    Hypothyroid> we have been following his TSH levels and he has been consistently 6-7 range; Labs 5/14 showed TSH=6.21 & we decided to start SYNTHROID5059md     GI- Divertics, Polyps, Hx ischemic bowel> the latter was due to an internal hernia & closed loop obstruction & not from any thrombotic or embolic phenomenon; he has done well since the surgery 7/13...    GU- BPH, hx prostate cancer, Renal insuffic> on Flomax0.4; followed by DrWrenn & he elected XRT- admin by DrKinard in 2009; he has a slowly rising PSA w/ a doubling time of 6-55mo66mo is considerinjg androgen ablation if necessary, Cr=1.5...  Marland KitchenMarland KitchenDJD> on Oxy-IR '5mg'$  prn; he is s/p bilat TKRs by DrOlin...    Hx Anemia> prev on Fe; Hg increased from 9.5 post op to 13.2 w/ Fe=70 (21%  sat) in Oct2013... We reviewed prob list, meds, xrays and labs> see below  for updates >>  LABS 5/14:  Chems- ok w/ stable mild RI (Cr=1.5);  CBC- wnl w/ Hg=14.2;  TSH=6.21 (Synthroid50 started);  BNP=263;  Protimes adjusted by the CC...   ~  May 19, 2013:  63moROV & GCaileanhas [facility] 5 stents [facility] 04/15/13 in a >4h operation w/ special spinal precautions he says; he developed a blood clot in his left arm w/ some persist swelling; [facility] and labs done last week (we do not yet have surgical or post [facility] notes [facility];  Pt reports that everything was OK but DrFarber wants to see better flow throught the areas of concern;  He has lost 7# down to 174# today;  BP looks good at 110/64 and O2 sats are 97% on RA at rest;  His CC is pain, esp back pain & he does not want to take narcotics- out of Percocet & he prefers Tramadol50 up to Tid & a musc relaxer- best covered is Tizanidine'4mg'$  tid as needed...  ~  August 19, 2013:  365moOV & GeBandys c/o some LBP after trying to lift wife several weeks ago; he has Tramadol Tid & using heat, we will refill Tizanidine and rec f/u w/ Ortho if not improving for poss shot... Otherw stable, doing satis overall...     Breathing is stable on Advair, spiriva, & Mucinex...    BP & Cardiovasc stable on Aten, Lasix, Coumadin...     Lipids stable on Lip10...     Thyroid stable on Synthroid50=> due for f/u TSH but wants to wait til next OV for FASTING blood work...    GU, BPH are stable on flomax0.'4mg'$ /d...  We reviewed prob list, meds, xrays and labs> see below for updates >> he had the 2014 Flu vaccine 10/14...  CXR 10/14 showed chr changes of Ao stent graft, stable cardiomegaly, no infiltrates, NAD...  LABS 10/14:  CBC- wnl;  Protimes followed in CC...    ~  September 11, 2013:  3wk ROV & GeZakariyahs added-on at son's request for 2 issues>>    1st> son called for refill of Losartan  which we did not have on his list; son notes [facility] that his BP has been higher, they did not disch him on any BP meds, so about 61m55moo he started his dad on an old prescription for Losartan100; when he was here 11/25 & his BP read 118/62 he was taking the Losartan100 & Lasix40 (he has Aten25 but "very seldom" takes that & it is written for 1/d prn BP>110);  Today is BP= 120/60 on the Lasix40 & Losartan100- asked to continue the same 7 Rx written for these...    2nd> he had sudden pleuritic right CP 12/14 & went to the ER- described as sharp, worse w/ DB but also worse w/ palp on right lower chest wall; no SOB, no sput, ?min streaky hemoptysis; ER eval showed norm resp effort, tender chest wall, no rubs; CXR w/o active dis and CTA showed no evid PE (see full reports for all the chronic changes); Labs were ok x BNP=4200; he reports prompt resolution of the CP w/ MS x2 and Toradol x1; the pain has not returned, and as a fringe benefit- the Vicodin he was given via the Er has really helped his daily LBP (his chief complaint...    We reviewed prob list,  meds, xrays and labs> see below for updates >> we refilled Hydrocodone5-325, Losartan100, Lasix40, Flomax0.4.Marland KitchenMarland Kitchen He has planned cardiac f/u soon to address the BNP of 4200 on Lasix40 w/ BUN=26 Cr=1.3           Problem List:  COPD (ICD-496) Hx of PNEUMONIA, ORGANISM UNSPECIFIED (ICD-486) PULMONARY NODULE & CAVITY RLL (ICD-518.89) - he is an ex-smoker, having quit in [place] after 40 yrs of smoking... he was exercising regularly and walking daily ~55m 5-6 days per week... on ALubbockdaily, +MUCINEX 2Bid w/ Fluids... ~  baseline CXR & CTChest w/ marked emphysema, atheromatous calcif, 5cm desc thorAA...  ~  CXR & CTA 2/11 in hosp showed cardiomeg + coronary calcif, advanced atherosclerosis of Ao w/ aneuryms 5.8cm into abd, underlying emphysema w/ interst edema & bilat effusions, no PE... ~  4/11:  RLL pneumonia which  was slow to clear... ~  6/11:  f/u CXR & CT Chest 6/11 w/ 6cm cavitary area RLL & nodular component inferiorly, +hilar adenopathy, no mediastinal nodes, etc; Tumor markers showed CEA=7.5 & Ca19-9= 10.8; > IR felt lesion was too hi risk for bx, therefore contin Rx & observe. ~  8/11:  f/u CXR w/o change in RLL opac (no worsening)>> repeat CEA=6.5; plan f/u CT Chest in 276mo~  10/11: f/u CT Chest showed interval decrease in size of RLL cavitary lesion & the inferiorly placed soft tissue component; otherw there is severe emphysema, cardiomeg, coronary calcif, thoracoabd aneurysm w/o change; there was an air-fluid level in a dilated esoph> check Ba Esophagram= Nonspecific esophageal motility disorder with very poor primary esophageal contractions (no HH, stricture, etc)... ~  12/12: f/u CTChest showed severe emphysema & scarring w/ bibasilar atx (no infiltrates no cavities), markedly tortuous & ectaticThorAo w/ extensive atherosclerotic changes & no change in decr thor aneurysm measuring 5.9 x 5.6cm, mild cardiomeg & dense coronary calcif as well... ~  4/13:  He had a fenestrated endovasc [facility]abdominal [facility] ~  subseq CXRs thru 7/13 hosp w/ cardiomeg, low lung vols, bibasilar airsp dis, etc... ~  CXR 1/14 showed cardiomeg, bibasilar fibrosis, thoracic aortic stent noted, NAD...[name]~  7/14:  ?RUL pneumonia treated w/ Avelox & resolved=> [facility]  CXR 10/14 showed chr changes of Ao stent graft, stable cardiomegaly, no infiltrates, NAD...   HYPERTENSION (ICD-401.9)                         << followed by DrCherly Hensenor Cards >> CONGESTIVE HEART FAILURE (ICD-428.0) ATRIAL FIBRILLATION (ICD-427.31) w/ clot in left atrial appendage >> resolved on f/u TEE, on COUMADIN per Cards & cardioverted. PREMATURE VENTRICULAR CONTRACTIONS, FREQUENT (ICD-427.69)  MEDS> prev on ATENOLOL '25mg'$ /d, & LASIX '20mg'$ -2AM + KCl 1018mid, and off Losartan... prev  Norvasc discontinued & hx ACE cough in the past on Lisinopril. ~  NuclearStressTest 1/05 was neg- no ischemia or infarct (+diaphrag attenuation), EF=56%... ~  repeat Nuclear study 6/09 was neg- no ischemia, mild inferoapic thinning, not gated due to PVCs... ~  2DEcho 6/09 showed mild dilated LV w/ EF= 45-50% but no regional wall motion abn, mild AoV calcif & AI, LA mild dil... ~  [place]10[place]  [place] ~  2/11:  in hosp- Cath= mild nonobstructive 3 vessel CAD, mod LVD w/ EF=40-45%; TEE= CHF w/ EF~45% w/ HK & left atrial appendage clot & AFib; he was diuresed &  Norvasc stopped, placed on Coumadin w/ careful f/u by Cherly Hensen & the CC... ~  Subseq successful [facility] & holding NSR w/ PVCs... ~  Recurrent AFib w/ RVR after his Thoracoabdominal stent graft 4/13... ~  6/13:  BP was low assoc w/ GU bleeding & ATENOLOL25 & LOSARTAN50 placed on HOLD... ~  COUMADIN on HOLD since GU bleeding & acute blood loss anemia; it has not yet been restarted... ~  8/13:  BP= 116/78 on LASIX '40mg'$ /d & K10-Bid;  Wt down 14# to 173# ~  10/13:  BP= 114/82 on Lasix40+K1-Bid (he takes Aten25 if BP>110 at home)... ~  12/13: he had Cards f/u DrNishan> note reviewed, no change in meds, he referred him to DrGearhart for f/u stent as he did not want to [facility] to [facility]... ~  5/14: HBP, CHF, AFib w/ hx clot in LA appendage- resolved on Coumadin> on ASA81, Aten25, Lasix40, K10Bid; BP= 114/72 & he denies CP, palpit etc; followed by Cherly Hensen for Cards- his notes are reviewed, stable- no changes made. ~  8/14: he is post op extensive vasc surg at [facility] w/ 5 stents placed by DrFarber...  PERIPHERAL VASCULAR DISEASE (ICD-443.9) - back on ASA '81mg'$ /d... he is s/p infrarenal AAA repair w/ right common iliac aneurysm repair (via Ao Bi-iliac graft) in 1998 by DrLawson... also has a known desc thor AA measuring ~5+cm and followed by DrGearhart...  ~  seen by DrGearhart 4/10 w/ CT scan showing ~5cm thoracoabdominal aneurysm w/o  signif change...  ~  seen by DrGearhart 2/11 in hosp w/ sl incr size of AAA- f/u planned 62mo ~  also followed by [facility]- DrCooper. ~  seen by DrGearhardt 10/11 & stable, no change, f/u 1 yr. ~  Seen 12/12 by DrGearhardt & CTscan is stable, continue conservative approach... ~  2/13:  [facility]... ~  4/13:  He had a fenestrated endovasc [facility]abdominal [facility] complic by lumbar epidural hematomas (on Coumadin) w/ paraplegia requiring readmission for laminectomy & evac of the hematomas;  [facility] and then [facility] but only spent 4d there before [facility] w/ altered mental status, pneumonia, AFib w/ rvr, & FTT> Neuro w/u was otherw neg (& he improved w/ supportive care)... ~  2/14:  CT Chest, Abdomen, Pelvis> per DrBrabham - see report (the abdominal component has enlarged)... ~  [facility]=> 5 stents placed by DrFarber 7/14...  HYPERCHOLESTEROLEMIA (ICD-272.0) - on LIPITOR '10mg'$ /d...  ~  [place] showed TChol 115, Tg 58, HDL 36, LDL 67 ~  FLP 5/08 showed TChol 118, TG 66, HDL 31, LDL 74 ~  FLP 5/09 showed TChol 126, TG 71, HDL 37, LDL 75 ~  FLP 6/10 showed TChol 116, TG 73, HDL 40, LDL 62 ~  2/11:  FLP not checked during the hospitalization... ~  [place] hosp showed TChol 94, TG 52, HDL 23, LDL 61 ~  FLP 12/12 on Lip10 showed TChol 107, TG 38, HDL 40, LDL 60 ~  He needs to ret for FASTING blood work...  HYPOTHYROIDISM >> he remains clinically euthyroid... ~  Labs 12/12 showed TSH= 6.10 ~  Labs 6/13 showed TSH= 6.66 ~  Labs 5/14 showed TSH= 6.21... We decided to start SYNTHROID 532m/d...  DIVERTICULOSIS OF COLON (ICD-562.10) &  COLONIC POLYPS (ICD-211.3) - hx polyps in 2003 = tubular adenoma... ~  last colonoscopy 9/06 showed divertics, hems, no polyps.  ISCHEMIC BOWEL >>  Danne presented to the ER 7/23 - 04/25/12 w/ abd pain- ELap surg revealed ~5 feet of frankly ischemic small bowel, likely due to internal hernia and closed loop obstruction=> Bowel resected & pulses intact w/ no evidence of embolic or thrombotic pathology;  Viable small bowel anastomosed & wound vac applied; he had a stable post op course...  PROSTATE CANCER (ICD-185) - eval by DrWrenn and they decided on XRT by DrKinard- finished 5/09 and he states that DrKinard "released me"... he sees DrWrenn every 6 months & they follow PSA closely (notes reviewed)... ~  8/12: f/u prostate cancer dx in 2009 & treated w/ XRT completed 6/09; slowly rising PSA w/ doubling time 6-81mo min voiding symptoms, he is considering androgen ablation if needed... ~  6/13: He developed hematuria & urinary retention requiring Urology eval, discontinuation of Coumadin, & Foley placement=> subseq removed 8/13 w/ adeq voiding trial... ~  3/14:  He had f/u visit w/ DrWrenn- PSA nadir after XRT was <0.04 and it has climbed to 0.54 (3/14) w/ PSADT of ~151yrthey chose to continue watchful waiting...  DEGENERATIVE JOINT DISEASE (ICD-715.90) - s/p bilat TKR's, [facility]- DrOlin... He has OXY-IR '5mg'$  as needed for pain...  ACTINIC SKIN DAMAGE (ICD-692.70) - followed by DrBrunetta Jeanshe knows to avoid sun exposure, use sun screen, etc...  ANEMIA >> ~  Labs 6/13 showed Hg= 8.4, Fe= 31 (11%sat); Rec to start FeSO4 Bid... ~  Labs 7/13 in hosp showed Hg= 12.6=>8.2 at disch... ~  Labs 8/13 showed Hg= 9.5, MCV= 94; rec to continue FeSO4 Bid... ~  Labs 10/13 showed Hg= 13.2 & ok to wean Fe to 1/d til gone then stop... ~  Labs 5/14 showed Hg= 14.2   Past Surgical History  Procedure Laterality Date  . Total knee arthroplasty      bilateral  . Abdominal aortic aneurysm repair  1998  . Appendectomy    . Inguinal hernia repair      left  . Joint replacement    . Laparotomy  04/16/2012    Procedure: EXPLORATORY LAPAROTOMY;  Surgeon: BrMadilyn HookDO;  [place] [facility] Service[place];  Laterality: N/A;  exploratory Laparotomy,Small bowel ressection abdominal wound vac placement.  . Laparotomy  04/18/2012    Procedure: EXPLORATORY LAPAROTOMY;  Surgeon: ThJoyice FasterCornett, MD;  [place];  Laterality: N/A;  exploratory laparotomy with small bowel anastomosis  . Application of wound vac  04/18/2012    Procedure: APPLICATION OF WOUND VAC;  Surgeon: ThJoyice FasterCornett, MD;  [place] [facility] Service: General;  Laterality: N/A;  Removal of abdominal wound vac, Application of incisional  wound vac  . Repair of aa  04/14/2013    done at chMustangncounter Prescriptions as of 09/11/2013  Medication Sig  . acetaminophen (TYLENOL) 500 MG tablet Take 500 mg by mouth every 6 (six) hours as needed.  . Marland Kitchenspirin 81 MG tablet Take 81 mg by mouth daily.   . Marland Kitchentenolol (TENORMIN) 25 MG tablet Take 1 tablet (25 mg total) by mouth daily as needed (if BP is over 110).  . Marland Kitchentorvastatin (LIPITOR) 10 MG tablet Take 1 tablet (10 mg total) by mouth daily.  . Fluticasone-Salmeterol (ADVAIR DISKUS) 250-50 MCG/DOSE AEPB inhale 1 dose by mouth twice a day  . furosemide (LASIX) 40  MG tablet Take 1 tablet (40 mg total) by mouth daily.  [name] guaiFENesin (MUCINEX) 600 MG 12 hr tablet Take 600 mg by mouth 2 (two) times daily. COPD.  [name] HYDROcodone-acetaminophen (NORCO/VICODIN) 5-325 MG per tablet Take 1 tablet by mouth 3 (three) times daily as needed for moderate pain.  [name] levothyroxine (SYNTHROID, LEVOTHROID) 50 MCG tablet Take 1 tablet (50 mcg total) by mouth daily before breakfast.  . losartan (COZAAR) 100 MG tablet Take 1 tablet (100 mg total) by mouth daily.  . Multiple Vitamin (MULTIVITAMIN) tablet Take 1 tablet by mouth daily.    . tamsulosin (FLOMAX) 0.4 MG CAPS capsule Take 1 capsule (0.4 mg total) by mouth at bedtime.  [name] tiotropium (SPIRIVA HANDIHALER) 18 MCG inhalation capsule Place 1 capsule (18 mcg total) into inhaler  and inhale daily.  [name] tiZANidine (ZANAFLEX) 4 MG capsule Take 1 capsule (4 mg total) by mouth 3 (three) times daily as needed for muscle spasms.  . traMADol (ULTRAM) 50 MG tablet Take 1 tablet (50 mg total) by mouth 3 (three) times daily as needed for pain.  [name] warfarin (COUMADIN) 2.5 MG tablet Take 1 tablet (2.5 mg total) by mouth as directed.  . [DISCONTINUED] furosemide (LASIX) 40 MG tablet Take 1 tablet (40 mg total) by mouth daily.  . [DISCONTINUED] HYDROcodone-acetaminophen (NORCO/VICODIN) 5-325 MG per tablet Take 1 tablet by mouth every 4 (four) hours as needed for moderate pain.  . [DISCONTINUED] losartan (COZAAR) 100 MG tablet Take 100 mg by mouth daily.  . [DISCONTINUED] Tamsulosin HCl (FLOMAX) 0.4 MG CAPS Take 0.4 mg by mouth at bedtime.   . [DISCONTINUED] azithromycin (ZITHROMAX Z-PAK) 250 MG tablet Take 1 tablet (250 mg total) by mouth daily. Take 2 tabs for first dose, then 1 tab for each additional dose  . [DISCONTINUED] ibuprofen (ADVIL,MOTRIN) 600 MG tablet Take 1 tablet (600 mg total) by mouth every 8 (eight) hours as needed.    No Known Allergies   Current Medications, Allergies, Past Medical History, Past Surgical History, Family History, and Social History were reviewed in [employer] record.   Review of Systems        See HPI - all other systems neg except as noted...  The patient complains of dyspnea on exertion.  The patient denies anorexia, fever, weight loss, weight gain, vision loss, decreased hearing, hoarseness, chest pain, syncope, peripheral edema, prolonged cough, headaches, hemoptysis, abdominal pain, melena, hematochezia, severe indigestion/heartburn, hematuria, incontinence, muscle weakness, suspicious skin lesions, transient blindness, difficulty walking, depression, unusual weight change, abnormal bleeding, enlarged lymph nodes, and angioedema.     Objective:   Physical Exam    WD, WN, 79 y/o WM in NAD... GENERAL:  Alert &  oriented; pleasant & cooperative; pale complexion... HEENT:  [place]/AT, EOM-wnl, PERRLA, EACs-clear, TMs-wnl, NOSE-clear, THROAT-clear & wnl. NECK:  Supple w/ fairROM; no JVD; normal carotid impulses w/o bruits; no thyromegaly or nodules palpated; no lymphadenopathy. CHEST:  decr BS bilat, scat bibasilar rales, w/o wheezing/ rhonchi/ signs of consolidation. HEART:  irregular rhythm, gr1/6 diast murmur in Ao area, without rubs or gallops detected... ABDOMEN:  Soft & nontender; normal bowel sounds; no organomegaly or masses palpated... EXT: s/p bilat TKR's, mod arthritic changes; no varicose veins/ +venous insuffic/ no edema. NEURO:  CN's intact;  gait abn; no focal neuro deficits... DERM:  No lesions noted; no rash etc...  RADIOLOGY DATA:  Reviewed in the EPIC EMR & discussed w/ the patient...  LABORATORY DATA:  Reviewed in the EPIC EMR & discussed  w/ the patient...   Assessment:      COPD, Hx Pneumonia>  baseline severe COPD/Emphysema w/ Cardiomeg, Aneurysm & bibasilar fibrosis, stable on Advair250, Spiriva, Mucinex...  HBP>  controlled on Losartan100, Lasix40, off KCl;  He uses Aten25 "as needed for BP>110 but says that he "seldom takes this med"...  CAD/ CHF/ etc>  Followed by Cherly Hensen & his prev notes are reviewed;  BNP in ER 12/14 was up to 4200 7 he has Cards f/u soon...  AFib>  On above + Coumadin followed in the CC...  Periph Vasc Dis>  S/p AAA repair 1998 by Sheryn Bison; then followed by DrGearhardt for thoraco-abd aneurysm & referred to [facility] for stent graft surg- done 4/13 by DrFarber w/ complications, and 2nd stage of the surgery 7/14 w/ 5 stents placed per DrFarber...  Ven Insuffic & EDEMA>  Improved w/ Lasix40/d... He knows to elim salt, elev legs, wear support hose, etc...  CHOL>  FLP looks good on Lip10...  Subclinical Hypothyroid>  TSH was 6.21 and Synthroid50 started 5/14...  GI> Presbyesoph, Divertics, Hx polpyps>  Stable & followed by DrDBrodie... 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  Prostate Cancer>  Followed by DrWrenn  w/ slowly rising PSA as noted; then developed urinary retention after lumbar surg and hematuria after foley & coumadin; now back to baseline & being followed...  DJD>  Followed by DrOlin, s/p bilat TKRs...  ANEMIA>  He developed acute blood loss anemia w/ post op Hg= 11-12 but dropped to 8.6 w/ hematuria & INR=4.2; Coumadin & ASA placed on HOLD; f/u labs showed Hg=8.4 & Fe=31 & started on FeSO4 Bid;  Then Hg=9.5 & rec to continue Fe Bid;  Cards restarted his Coumadin;  Now Hg= 13.2 & Fe improved, ok to wean off Fe...  Actinic skin changes>  Aware, he is monitored by Derm...     Plan:     Patient's Medications  New Prescriptions   No medications on file  Previous Medications   ACETAMINOPHEN (TYLENOL) 500 MG TABLET    Take 500 mg by mouth every 6 (six) hours as needed.   ASPIRIN 81 MG TABLET    Take 81 mg by mouth daily.    ATENOLOL (TENORMIN) 25 MG TABLET    Take 1 tablet (25 mg total) by mouth daily as needed (if BP is over 110).   ATORVASTATIN (LIPITOR) 10 MG TABLET    Take 1 tablet (10 mg total) by mouth daily.   FLUTICASONE-SALMETEROL (ADVAIR DISKUS) 250-50 MCG/DOSE AEPB    inhale 1 dose by mouth twice a day   GUAIFENESIN (MUCINEX) 600 MG 12 HR TABLET    Take 600 mg by mouth 2 (two) times daily. COPD.   LEVOTHYROXINE (SYNTHROID, LEVOTHROID) 50 MCG TABLET    Take 1 tablet (50 mcg total) by mouth daily before breakfast.   MULTIPLE VITAMIN (MULTIVITAMIN) TABLET    Take 1 tablet by mouth daily.     TIOTROPIUM (SPIRIVA HANDIHALER) 18 MCG INHALATION CAPSULE    Place 1 capsule (18 mcg total) into inhaler and inhale daily.   TIZANIDINE (ZANAFLEX) 4 MG CAPSULE    Take 1 capsule (4 mg total) by mouth 3 (three) times daily as needed for muscle spasms.   TRAMADOL (ULTRAM) 50 MG TABLET    Take 1 tablet (50 mg total) by mouth 3 (three) times  daily as needed for pain.   WARFARIN (COUMADIN)  2.5 MG TABLET    Take 1 tablet (2.5 mg total) by mouth as directed.  Modified Medications   Modified Medication Previous Medication   FUROSEMIDE (LASIX) 40 MG TABLET furosemide (LASIX) 40 MG tablet      Take 1 tablet (40 mg total) by mouth daily.    Take 1 tablet (40 mg total) by mouth daily.   HYDROCODONE-ACETAMINOPHEN (NORCO/VICODIN) 5-325 MG PER TABLET HYDROcodone-acetaminophen (NORCO/VICODIN) 5-325 MG per tablet      Take 1 tablet by mouth 3 (three) times daily as needed for moderate pain.    Take 1 tablet by mouth every 4 (four) hours as needed for moderate pain.   LOSARTAN (COZAAR) 100 MG TABLET losartan (COZAAR) 100 MG tablet      Take 1 tablet (100 mg total) by mouth daily.    Take 100 mg by mouth daily.   TAMSULOSIN (FLOMAX) 0.4 MG CAPS CAPSULE Tamsulosin HCl (FLOMAX) 0.4 MG CAPS      Take 1 capsule (0.4 mg total) by mouth at bedtime.    Take 0.4 mg by mouth at bedtime.   Discontinued Medications   AZITHROMYCIN (ZITHROMAX Z-PAK) 250 MG TABLET    Take 1 tablet (250 mg total) by mouth daily. Take 2 tabs for first dose, then 1 tab for each additional dose   IBUPROFEN (ADVIL,MOTRIN) 600 MG TABLET    Take 1 tablet (600 mg total) by mouth every 8 (eight) hours as needed.

## 2013-09-11 NOTE — Patient Instructions (Signed)
Today we updated your med list in our EPIC system...    Continue your current medications the same...  Thanks for bringing all your med bottles to the office today...    We reviewed these prescriptions and wrote for refills of the Hydrocodone5, Losartan100, Lasix40, & Tamsulosin0.4.Marland KitchenMarland Kitchen  Call for any questions...  Let's plan a follow up visit in 69mo, sooner if needed for problems.Marland KitchenMarland Kitchen

## 2013-09-11 NOTE — Telephone Encounter (Signed)
Pt has appt today with SN and we will discuss with pt at this time.

## 2013-09-11 NOTE — Assessment & Plan Note (Signed)
Resolved on beta blocker No palpitations

## 2013-09-11 NOTE — Assessment & Plan Note (Signed)
Well controlled.  Continue current medications and low sodium Dash type diet.    

## 2013-09-11 NOTE — Progress Notes (Signed)
Patient ID: Ryan Hess, male   DOB: 1924-07-12, 79 y.o.   MRN: [id number] Ryan Hess returns today for followup. . He has a history of aortic aneurysm repair by Dr. Tyrone Sage. He has residual  descending thoracic aneurysm that was stented at [facility] in Ap[place]  2013  After stent he subsequently had ischemic bowel which required emergent surgery with secondary closure. Back on coumadin Reviewed INR;s and they have been good at 2.2-2.6 With no bleeding issues   Also  complicated by cord edema and he had to have a lumbar procedure. He has hypertension. The last time I saw him had a bit of a cough and we stopped his lisinopril and switched him to Atacand. His cough seems to be improved. He is not having any chest pain, PND, or orthopnea. He is active. He walks on a regular basis. He tends towards bradycardia and has had occasional PVCs. Chronic afib on coumadin with no bleeding problems He had a Myoview study done March 24, 2008, which was nonischemic. EF was not calculated due to PVCs. His EF by echo on March 24, 2008, was 45-50%. We had him on low-dose atenolol and this seems to have settled him down.   Seen in ER 12/15 for right sided chest pain ? Pleurisy  Given zpack  And anti inflammatory Lasix helping with LE edema .      ROS: Denies fever, malais, weight loss, blurry vision, decreased visual acuity, cough, sputum, SOB, hemoptysis, pleuritic pain, palpitaitons, heartburn, abdominal pain, melena, lower extremity edema, claudication, or rash.  All other systems reviewed and negative  General: Affect appropriate Frail elderly male  HEENT: normal Neck supple with no adenopathy JVP normal no bruits no thyromegaly Lungs poor air movement no wheezing and good diaphragmatic motion Heart:  S1/S2 SEM  murmur, no rub, gallop or click PMI normal Abdomen: benighn, BS positve, no tenderness, no AAA no bruit.  No HSM or HJR Distal pulses intact with no bruits Plus one bilateral edema Neuro non-focal Skin warm  and dry No muscular weakness   Current Outpatient Prescriptions  Medication Sig Dispense Refill  . acetaminophen (TYLENOL) 500 MG tablet Take 500 mg by mouth every 6 (six) hours as needed.      [name] aspirin 81 MG tablet Take 81 mg by mouth daily.       [name] atenolol (TENORMIN) 25 MG tablet Take 1 tablet (25 mg total) by mouth daily as needed (if BP is over 110).  30 tablet  0  . atorvastatin (LIPITOR) 10 MG tablet Take 1 tablet (10 mg total) by mouth daily.  90 tablet  3  . Fluticasone-Salmeterol (ADVAIR DISKUS) 250-50 MCG/DOSE AEPB inhale 1 dose by mouth twice a day  180 each  3  . furosemide (LASIX) 40 MG tablet Take 1 tablet (40 mg total) by mouth daily.  30 tablet  11  . guaiFENesin (MUCINEX) 600 MG 12 hr tablet Take 600 mg by mouth 2 (two) times daily. COPD.      [name] HYDROcodone-acetaminophen (NORCO/VICODIN) 5-325 MG per tablet Take 1 tablet by mouth 3 (three) times daily as needed for moderate pain.  50 tablet  0  . levothyroxine (SYNTHROID, LEVOTHROID) 50 MCG tablet Take 1 tablet (50 mcg total) by mouth daily before breakfast.  90 tablet  3  . losartan (COZAAR) 100 MG tablet Take 1 tablet (100 mg total) by mouth daily.  30 tablet  11  . Multiple Vitamin (MULTIVITAMIN) tablet Take 1 tablet by mouth daily.        [name]  tamsulosin (FLOMAX) 0.4 MG CAPS capsule Take 1 capsule (0.4 mg total) by mouth at bedtime.  30 capsule  11  . tiotropium (SPIRIVA HANDIHALER) 18 MCG inhalation capsule Place 1 capsule (18 mcg total) into inhaler and inhale daily.  90 capsule  3  . tiZANidine (ZANAFLEX) 4 MG capsule Take 1 capsule (4 mg total) by mouth 3 (three) times daily as needed for muscle spasms.  90 capsule  5  . traMADol (ULTRAM) 50 MG tablet Take 1 tablet (50 mg total) by mouth 3 (three) times daily as needed for pain.  90 tablet  3  . warfarin (COUMADIN) 2.5 MG tablet Take 1 tablet (2.5 mg total) by mouth as directed.  90 tablet  1  . [DISCONTINUED] pantoprazole (PROTONIX) 40 MG tablet Take 1 tablet (40 mg  total) by mouth daily.  30 tablet  5   No current facility-administered medications for this visit.    Allergies  Review of patient's allergies indicates no known allergies.  Electrocardiogram:  12/15  AFib rate 100 PVC nonspecific ST/T wave changes   Assessment and Plan

## 2013-09-11 NOTE — Assessment & Plan Note (Signed)
Recent pleurisy Rx with zpack No fever or sputum  CT with no infiltrate  STable

## 2013-09-11 NOTE — Patient Instructions (Signed)
Your physician wants you to follow-up in:  6 MONTHS WITH DR NISHAN  You will receive a reminder letter in the mail two months in advance. If you don't receive a letter, please call our office to schedule the follow-up appointment. Your physician recommends that you continue on your current medications as directed. Please refer to the Current Medication list given to you today. 

## 2013-09-11 NOTE — Assessment & Plan Note (Signed)
Good rate control and anticoagulation F/U INR 3 weeks

## 2013-09-22 ENCOUNTER — Other Ambulatory Visit: Payer: Self-pay | Admitting: *Deleted

## 2013-09-22 MED ORDER — WARFARIN SODIUM 2.5 MG PO TABS: 2.5000 mg | ORAL_TABLET | ORAL | Status: DC

## 2013-09-24 ENCOUNTER — Telehealth: Payer: Self-pay | Admitting: Pulmonary Disease

## 2013-09-24 NOTE — Telephone Encounter (Signed)
Pt requesting an appt with SN this week. Recent pleurisy Pt c/o burning in chest/discomfort, a lot of belching x 3 days Son states that symptoms are very similar to when he had the lung problems.  Please advise Dr Kriste Basque. Thanks.

## 2013-09-24 NOTE — Telephone Encounter (Signed)
Per SN: he is out. Pt can see TP or another provider in office  Called spoke with son. He scheduled pt appt to see MW on Friday at 1:45. Nothing further needed

## 2013-09-26 ENCOUNTER — Ambulatory Visit: Payer: Medicare Other | Admitting: Internal Medicine

## 2013-10-03 ENCOUNTER — Telehealth: Payer: Self-pay

## 2013-10-03 ENCOUNTER — Ambulatory Visit (INDEPENDENT_AMBULATORY_CARE_PROVIDER_SITE_OTHER): Payer: Medicare Other

## 2013-10-03 DIAGNOSIS — I4891 Unspecified atrial fibrillation: Secondary | ICD-10-CM

## 2013-10-03 DIAGNOSIS — Z7901 Long term (current) use of anticoagulants: Secondary | ICD-10-CM

## 2013-10-03 LAB — POCT INR: INR: >8

## 2013-10-03 LAB — PROTIME-INR
INR: 5.96 (ref <1.50)
Prothrombin Time: 50.8 seconds — ABNORMAL HIGH (ref 11.6–15.2)

## 2013-10-03 NOTE — Telephone Encounter (Signed)
Pt is seeing Dr Nelva Bush for back pain.  Pt brought in card asking if pt could have an epidural shot.  He would need to hold Coumadin 5 days prior to procedure.  Procedure is not yet scheduled.  Please advise, and fax clearance to Dr Nelva Bush [phone].  Thanks

## 2013-10-03 NOTE — Telephone Encounter (Signed)
Yes can hold coumadin for procedure

## 2013-10-04 ENCOUNTER — Other Ambulatory Visit: Payer: Self-pay | Admitting: Cardiovascular Disease

## 2013-10-06 ENCOUNTER — Ambulatory Visit (INDEPENDENT_AMBULATORY_CARE_PROVIDER_SITE_OTHER): Payer: Medicare Other | Admitting: *Deleted

## 2013-10-06 DIAGNOSIS — I4891 Unspecified atrial fibrillation: Secondary | ICD-10-CM

## 2013-10-06 LAB — POCT INR: INR: 3.7

## 2013-10-06 NOTE — Telephone Encounter (Signed)
PT  AWARE  MAY HOLD  COUMADIN 5  DAYS PRIOR TO PROCEDURE .Adonis Housekeeper

## 2013-10-06 NOTE — Telephone Encounter (Signed)
[place] FAXED  PHONE NOTE  TO  DR RAMOS  PT  MAY HOLD COUMADIN FOR 5 DAYS  PRIOR  TO  INJ ./CY

## 2013-10-09 ENCOUNTER — Ambulatory Visit: Payer: Medicare Other | Attending: Physical Medicine and Rehabilitation | Admitting: Physical Therapy

## 2013-10-09 DIAGNOSIS — IMO0001 Reserved for inherently not codable concepts without codable children: Secondary | ICD-10-CM | POA: Insufficient documentation

## 2013-10-09 DIAGNOSIS — I10 Essential (primary) hypertension: Secondary | ICD-10-CM | POA: Insufficient documentation

## 2013-10-09 DIAGNOSIS — I4891 Unspecified atrial fibrillation: Secondary | ICD-10-CM | POA: Insufficient documentation

## 2013-10-09 DIAGNOSIS — M545 Low back pain, unspecified: Secondary | ICD-10-CM | POA: Insufficient documentation

## 2013-10-09 DIAGNOSIS — Z96659 Presence of unspecified artificial knee joint: Secondary | ICD-10-CM | POA: Insufficient documentation

## 2013-10-15 ENCOUNTER — Ambulatory Visit: Payer: Medicare Other | Admitting: Physical Therapy

## 2013-10-17 ENCOUNTER — Ambulatory Visit (INDEPENDENT_AMBULATORY_CARE_PROVIDER_SITE_OTHER): Payer: Medicare Other

## 2013-10-17 DIAGNOSIS — I4891 Unspecified atrial fibrillation: Secondary | ICD-10-CM

## 2013-10-17 LAB — POCT INR: INR: 2.2

## 2013-11-10 ENCOUNTER — Ambulatory Visit: Payer: Medicare Other | Admitting: Pulmonary Disease

## 2013-12-02 ENCOUNTER — Ambulatory Visit (INDEPENDENT_AMBULATORY_CARE_PROVIDER_SITE_OTHER): Payer: Medicare Other | Admitting: Pharmacist

## 2013-12-02 DIAGNOSIS — I4891 Unspecified atrial fibrillation: Secondary | ICD-10-CM

## 2013-12-02 LAB — POCT INR: INR: 4.5

## 2013-12-05 ENCOUNTER — Other Ambulatory Visit: Payer: Self-pay | Admitting: Pulmonary Disease

## 2013-12-05 DIAGNOSIS — D126 Benign neoplasm of colon, unspecified: Secondary | ICD-10-CM

## 2013-12-05 DIAGNOSIS — E7800 Pure hypercholesterolemia, unspecified: Secondary | ICD-10-CM

## 2013-12-05 DIAGNOSIS — I10 Essential (primary) hypertension: Secondary | ICD-10-CM

## 2013-12-19 ENCOUNTER — Ambulatory Visit (INDEPENDENT_AMBULATORY_CARE_PROVIDER_SITE_OTHER): Payer: Medicare Other | Admitting: Pharmacist

## 2013-12-19 DIAGNOSIS — I4891 Unspecified atrial fibrillation: Secondary | ICD-10-CM

## 2013-12-19 LAB — POCT INR: INR: 3

## 2013-12-22 ENCOUNTER — Other Ambulatory Visit: Payer: Self-pay | Admitting: *Deleted

## 2013-12-22 MED ORDER — WARFARIN SODIUM 2.5 MG PO TABS: ORAL_TABLET | ORAL | Status: DC

## 2013-12-30 ENCOUNTER — Telehealth: Payer: Self-pay | Admitting: Pulmonary Disease

## 2013-12-30 NOTE — Telephone Encounter (Signed)
Spoke with Coralyn Mark. He reports pt is passing blood when he urinates. Pt see's Dr. Jeffie Pollock and was not sure who to call. I advised him to call Dr. Ralene Muskrat office. Nothing further needed

## 2014-01-09 ENCOUNTER — Ambulatory Visit (INDEPENDENT_AMBULATORY_CARE_PROVIDER_SITE_OTHER): Payer: Medicare Other | Admitting: *Deleted

## 2014-01-09 DIAGNOSIS — I4891 Unspecified atrial fibrillation: Secondary | ICD-10-CM

## 2014-01-09 LAB — POCT INR: INR: 3

## 2014-01-22 ENCOUNTER — Telehealth: Payer: Self-pay | Admitting: Pulmonary Disease

## 2014-01-22 NOTE — Addendum Note (Signed)
Addended by: Maurice March on: 01/22/2014 11:01 AM   Modules accepted: Orders

## 2014-01-22 NOTE — Telephone Encounter (Signed)
Received Rx refill request for 90 days supply for Lasix 40mg . Called and spoke to pharmacist at [facility] on [address] and discontinued 30 day supply for 90 day supply with 3 refills of Lasix 40mg  qd.

## 2014-01-22 NOTE — Telephone Encounter (Signed)
Received Rx refill request for 90 supply for Losartan 100mg . Called and spoke to pharmacist at [facility] on [address] and converted the patients current refills to the 90 supply to last the pt till Sept for their new PCP to continue care.

## 2014-02-06 ENCOUNTER — Ambulatory Visit (INDEPENDENT_AMBULATORY_CARE_PROVIDER_SITE_OTHER): Payer: Medicare Other

## 2014-02-06 DIAGNOSIS — I4891 Unspecified atrial fibrillation: Secondary | ICD-10-CM

## 2014-02-06 LAB — POCT INR: INR: 3

## 2014-02-09 ENCOUNTER — Ambulatory Visit (INDEPENDENT_AMBULATORY_CARE_PROVIDER_SITE_OTHER): Payer: Medicare Other | Admitting: Pulmonary Disease

## 2014-02-09 ENCOUNTER — Encounter: Payer: Self-pay | Admitting: Pulmonary Disease

## 2014-02-09 ENCOUNTER — Other Ambulatory Visit (INDEPENDENT_AMBULATORY_CARE_PROVIDER_SITE_OTHER): Payer: Medicare Other

## 2014-02-09 VITALS — BP 120/64 | HR 88 | Temp 97.6°F | Ht 69.0 in | Wt 174.8 lb

## 2014-02-09 DIAGNOSIS — R0609 Other forms of dyspnea: Secondary | ICD-10-CM

## 2014-02-09 DIAGNOSIS — R5383 Other fatigue: Secondary | ICD-10-CM

## 2014-02-09 DIAGNOSIS — I712 Thoracic aortic aneurysm, without rupture, unspecified: Secondary | ICD-10-CM

## 2014-02-09 DIAGNOSIS — I10 Essential (primary) hypertension: Secondary | ICD-10-CM

## 2014-02-09 DIAGNOSIS — R0600 Dyspnea, unspecified: Secondary | ICD-10-CM

## 2014-02-09 DIAGNOSIS — R0989 Other specified symptoms and signs involving the circulatory and respiratory systems: Secondary | ICD-10-CM

## 2014-02-09 DIAGNOSIS — I4891 Unspecified atrial fibrillation: Secondary | ICD-10-CM

## 2014-02-09 DIAGNOSIS — M545 Low back pain, unspecified: Secondary | ICD-10-CM

## 2014-02-09 DIAGNOSIS — I739 Peripheral vascular disease, unspecified: Secondary | ICD-10-CM

## 2014-02-09 DIAGNOSIS — I5022 Chronic systolic (congestive) heart failure: Secondary | ICD-10-CM

## 2014-02-09 DIAGNOSIS — J449 Chronic obstructive pulmonary disease, unspecified: Secondary | ICD-10-CM

## 2014-02-09 DIAGNOSIS — R5381 Other malaise: Secondary | ICD-10-CM

## 2014-02-09 DIAGNOSIS — I509 Heart failure, unspecified: Secondary | ICD-10-CM

## 2014-02-09 DIAGNOSIS — N289 Disorder of kidney and ureter, unspecified: Secondary | ICD-10-CM

## 2014-02-09 DIAGNOSIS — E039 Hypothyroidism, unspecified: Secondary | ICD-10-CM

## 2014-02-09 DIAGNOSIS — M1990 Unspecified osteoarthritis, unspecified site: Secondary | ICD-10-CM

## 2014-02-09 DIAGNOSIS — K5730 Diverticulosis of large intestine without perforation or abscess without bleeding: Secondary | ICD-10-CM

## 2014-02-09 DIAGNOSIS — J4489 Other specified chronic obstructive pulmonary disease: Secondary | ICD-10-CM

## 2014-02-09 DIAGNOSIS — C61 Malignant neoplasm of prostate: Secondary | ICD-10-CM

## 2014-02-09 DIAGNOSIS — E7800 Pure hypercholesterolemia, unspecified: Secondary | ICD-10-CM

## 2014-02-09 LAB — CBC WITH DIFFERENTIAL/PLATELET
Basophils Absolute: 0.1 10*3/uL (ref 0.0–0.1)
Basophils Relative: 0.6 % (ref 0.0–3.0)
Eosinophils Absolute: 0.2 10*3/uL (ref 0.0–0.7)
Eosinophils Relative: 1.7 % (ref 0.0–5.0)
HCT: 41.2 % (ref 39.0–52.0)
Hemoglobin: 13.2 g/dL (ref 13.0–17.0)
Lymphocytes Relative: 15.4 % (ref 12.0–46.0)
Lymphs Abs: 1.4 10*3/uL (ref 0.7–4.0)
MCHC: 32.1 g/dL (ref 30.0–36.0)
MCV: 97 fl (ref 78.0–100.0)
Monocytes Absolute: 0.8 10*3/uL (ref 0.1–1.0)
Monocytes Relative: 8.8 % (ref 3.0–12.0)
Neutro Abs: 6.6 10*3/uL (ref 1.4–7.7)
Neutrophils Relative %: 73.5 % (ref 43.0–77.0)
Platelets: 224 10*3/uL (ref 150.0–400.0)
RBC: 4.25 Mil/uL (ref 4.22–5.81)
RDW: 15.2 % (ref 11.5–15.5)
WBC: 9 10*3/uL (ref 4.0–10.5)

## 2014-02-09 LAB — BASIC METABOLIC PANEL
BUN: 26 mg/dL — ABNORMAL HIGH (ref 6–23)
CO2: 29 mEq/L (ref 19–32)
Calcium: 9.3 mg/dL (ref 8.4–10.5)
Chloride: 104 mEq/L (ref 96–112)
Creatinine, Ser: 1.6 mg/dL — ABNORMAL HIGH (ref 0.4–1.5)
GFR: 44.83 mL/min — ABNORMAL LOW (ref >60.00)
Glucose, Bld: 97 mg/dL (ref 70–99)
Potassium: 4.6 mEq/L (ref 3.5–5.1)
Sodium: 141 mEq/L (ref 135–145)

## 2014-02-09 LAB — TSH: TSH: 1.92 u[IU]/mL (ref 0.35–4.50)

## 2014-02-09 LAB — BRAIN NATRIURETIC PEPTIDE: Pro B Natriuretic peptide (BNP): 184 pg/mL — ABNORMAL HIGH (ref 0.0–100.0)

## 2014-02-09 NOTE — Progress Notes (Signed)
Subjective:     Patient ID: Ryan Hess, male   DOB: 1923/12/02, 80 y.o.   MRN: [id number]  HPI 80 y/o WM here for a follow up visit... he has multiple medical problems as noted below...    ~  May 03, 2012:  6wk ROV & post hosp follow up> Ryan Hess presented to the ER 7/23 - 04/25/12 w/ abd pain- ELap surg revealed ~5 feet of frankly ischemic small bowel, likely due to internal hernia and closed loop obstruction=> Bowel resected & pulses intact w/ no evidence of embolic or thrombotic pathology;  Viable small bowel anastomosed & wound vac applied; he had a stable post op course=> foley left in place;  Disch 8/1 w/ home health etc...    Since disch we had call from son regarding pts Cardiac meds and BP> when last seen his BP was low & he was weak- Aten & Losartan placed on hold, continued on Lasix40, he's been off his Coumadin since 6/13 w/ hematuria/ anemia=> hasn't yet been restarted & only covered transiently in hosp w/ Lovenox, disch on ASA81, no coumadin;  BP since disch was still low (95/70) so Aten, Losartan kept on [place] continued...     He is pale & weak but gaining strength he says;  He saw Urology 8/8 w/ adeq voiding trial & Foley removed, voiding satis so far;  Since disch he's noted some pitting edema in LEs and L>R arm swelling (he had left IJ line in hosp)=> we decided to check VenDopplers ASAP & prelim report indicates no subclavian clots;  Needs Cards follow up & decision regarding restart of Coumadin w/ careful protime monitoring...  We reviewed prob list, meds, xrays and labs >>  LABS 7/13 in hosp near disch> Hg=8.2, MCV=93, BS=ok, K=3.3, Creat=0.85, Alb=3.2, LFTs=wnl...  CXR 7/13 showed cardiomeg, intubated, low lung vols & bilat LL airsp dis L>R...  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  ~  May 23, 2012:  3wk ROV & Lot's swelling is  diminished & his weight is down 13# to 173# today on Lasix40 & K10Bid;  BP= 112/70 & his strength is sl better w/ Losartan50 on HOLD & son is giving him Aten25 only if BP>110 sys... NOTE> he has restarted the ASA46m/d but his COUMADIN is still on hold & pending Cardiology appt next week (Off Coumadin since 6/13 due to hematuria, then abd pain/ ELap, etc; he has had several coumadin complications)...    We reviewed prob list, meds, xrays and labs> see below for updates >>  ~  July 08, 2012:  6wk ROV & GBassemis stable, slowly gaining strength, wt up 3# to 176# today, BP well regulated on his Lasix40 & takes the Aten25 only if BP>110;  Breathing stable on Advair250 + Spiriva & he uses the Mucinex as needed;  Due for f/u CBC & Fe on his FeSO4 32130mid...    We reviewed prob list, meds, xrays and labs> see below for updates >> OK Flu vaccine today...  LABS 10/13:  Chems- wnl;  CBC- improved w/ Hg=13.2 & Fe looks good etc...  ~  October 07, 2012:  30m730moV & Ryan Hess much better, stable, just c/o some back discomfort;  We reviewed the following medical problems during today's office visit >>     COPD, HxPneumonia> on Advair250, Spiriva, Mucinex; last CT  12/12 w/ severe emphysema & scarring, plus his extensive atherosclerotic dis/ ectatic thorAo/ aneurysm/ etc; his breathing is stable- mild cough, min sput, no hemoptysis, stable DOE, etc...     HBP, CHF, AFib w/ hx clot in LA appendage- resolved on Coumadin> on ASA81, Aten25, Lasix40, K10Bid; followed by [place] for Cards- seen 12/13 & his note is reviewed, no changes made...    Peripheral Vasc Dis> on ASA81 & Coumadin; followed by DrGearhardt for VascSurg  Here & at [facility] as noted (see extensive hx below)...    Chol> on Lip10; last FLP 12/12 showed TChol 107, TG 38, HDL 40, LDL 60    GI- Divertics, Polyps, Hx ischemic bowel> the latter was due to an internal hernia & closed loop obstruction & not from any thrombotic or embolic phenomenon; he has done  well since the surgery...    GU- prostate cancer> followed by DrWrenn & he elected XRT- admin by DrKinard in 2009; he has a slowly rising PSa w/ a doubling time of 6-52mo he is considerinjg androgen ablation if necessary...    DJD> on Oxy-IR 531mprn; he is s/p bilat TKRs by DrOlin    Hx Anemia> on Fe &VI- Hg increased from 9.5 post op to 13.2 w/ Fe=70 (21% sat) in Oct2013... We reviewed prob list, meds, xrays and labs> see below for updates >>   CXR 1/14 showed cardiomegaly, aortic stent stable, clear lungs w/ basilar scarring, NAD...    ~  Feb 04, 2013:  27m101moV & Ryan Hess [facility] in his planned thoracoabd aneurysm repair- he had his situation reviewed by DrBrabham here & [facility] since this needs to be done endovascularly & is quite complicated... We reviewed the following medical problems during today's office visit >>     COPD, HxPneumonia> on Advair250, Spiriva, Mucinex; last CT 12/12 w/ severe emphysema & scarring, plus his extensive atherosclerotic dis/ ectatic thorAo/ aneurysm/ etc; his breathing is stable- mild cough, min sput, no hemoptysis, stable DOE, etc...     HBP, CHF, AFib w/ hx clot in LA appendage- resolved on Coumadin> on ASA81, Aten25, Lasix40, K10Bid; BP= 114/72 & he denies CP, palpit etc; followed by DrNCherly Hensenr Cards- his notes are reviewed, stable- no changes made...    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 DrFarbers note indicates that to finish the repair he will require 3 branches and a left renal fenestration, they quoted him a risk of  paraplegia in the 10-15% range & he will need preadmission for a lumbar drain to be placed, etc; [facility]    Chol> on Lip10; last FLP 12/12 showed TChol 107, TG 38, HDL 40, LDL 60    Hypothyroid> we have been following his TSH levels and he has been consistently 6-7 range; Labs 5/14 showed TSH=6.21 & we decided to start SYNTHROID50m27m     GI- Divertics, Polyps, Hx ischemic bowel> the latter was due to an internal hernia & closed loop obstruction & not from any thrombotic or embolic phenomenon; he has done well since  the surgery 7/13...    GU- BPH, hx prostate cancer, Renal insuffic> on Flomax0.4; followed by DrWrenn & he elected XRT- admin by DrKinard in 2009; he has a slowly rising PSA w/ a doubling time of 6-25mo he is considerinjg androgen ablation if necessary, Cr=1.5..Marland KitchenMarland Kitchen   DJD> on Oxy-IR 5676mprn; he is s/p bilat TKRs by DrOlin...    Hx Anemia> prev on Fe; Hg increased from 9.5 post op to 13.2 w/ Fe=70 (21% sat) in Oct2013... We reviewed prob list, meds, xrays and labs> see below for updates >>   LABS 5/14:  Chems- ok w/ stable mild RI (Cr=1.5);  CBC- wnl w/ Hg=14.2;  TSH=6.21 (Synthroid50 started);  BNP=263;  Protimes adjusted by the CC...   ~  May 19, 2013:  76m42moV & Ryan Hess [facility]/15/13 in a >4h operation w/ special spinal precautions he says; he developed a blood clot in his left arm w/ some persist swelling; [facility] labs done [facility] week (we do not yet have surgical or post [facility] reports that everything was OK but DrFarber wants to see better flow throught the areas of concern;  He has lost 7# down to 174# today;  BP looks good at 110/64 and O2 sats are 97% on RA at rest;  His CC is pain, esp back pain & he does not want to take narcotics- out of Percocet & he prefers Tramadol50 up to Tid & a musc relaxer- best covered is Tizanidine4mg8mid as needed...  ~  August 19, 2013:  76mo 57mo& Ryan Hess/o some LBP after trying to lift wife several weeks ago; he has Tramadol Tid & using heat, we will refill Tizanidine and rec f/u w/ Ortho if not improving for poss shot... Otherw stable, doing satis overall...     Breathing is stable on Advair, spiriva, & Mucinex...    BP & Cardiovasc stable on Aten, Lasix, Coumadin...     Lipids stable on Lip10...     Thyroid stable on Synthroid50=> due for f/u TSH but wants to wait til next OV for FASTING blood work...    GU, BPH are stable on flomax0.4mg/d42m  We reviewed prob list, meds, xrays and labs> see below for updates >> he had the 2014 Flu vaccine 10/14...   CXR 10/14 showed chr changes of Ao stent graft, stable cardiomegaly, no infiltrates, NAD...   LABS 10/14:  CBC- wnl;  Protimes followed in CC...    ~  September 11, 2013:  3wk ROV & Ryan Hess at son's request for 2 issues>>    1st> son called for refill of Losartan which we did not have on his list; son notes that [facility], they did not disch him on any BP meds, so about 29mo ag576mo started his dad on an old prescription for Losartan100; when he was here 11/25 & his BP read 118/62 he was taking the Losartan100 & Lasix40 (he has Aten25 but "very seldom" takes that & it is written for 1/d prn BP>110);  Today is BP= 120/60 on the Lasix40 & Losartan100- asked to continue the same 7 Rx written for these...    2nd> he had sudden pleuritic right CP 12/14 & went to the ER- described as sharp, worse w/ DB but  also worse w/ palp on right lower chest wall; no SOB, no sput, ?min streaky hemoptysis; ER eval showed norm resp effort, tender chest wall, no rubs; CXR w/o active dis and CTA showed no evid PE (see full reports for all the chronic changes); Labs were ok x BNP=4200; he reports prompt resolution of the CP w/ MS x2 and Toradol x1; the pain has not returned, and as a fringe benefit- the  Vicodin he was given via the Er has really helped his daily LBP (his chief complaint...    We reviewed prob list, meds, xrays and labs> see below for updates >> we refilled Hydrocodone5-325, Losartan100, Lasix40, Flomax0.4.Marland KitchenMarland Kitchen He has planned cardiac f/u soon to address the BNP of 4200 on Lasix40 w/ BUN=26 Cr=1.3  ~  Feb 09, 2014:  77moROV & GRasheehas been quite stable, breathing has been good but they are concerned regarding report of donut hole med cost of $800/mo or more... [facility] & was told that everything looks great... We reviewed the following medical problems during today's office visit >>     COPD, HxPneumonia> on Advair250, Spiriva, Mucinex; last CT 12/12 w/ severe emphysema & scarring, plus his extensive atherosclerotic dis/ ectatic thorAo/ aneurysm/ etc; his breathing is stable- mild cough, min sput, no hemoptysis, stable DOE, etc...     HBP, CHF, AFib w/ hx clot in LA appendage- resolved on Coumadin> on ASA81, Coumadin, Aten25 (if BP>110), Losartan100, Lasix40; BP= 120/64 & he denies CP, palpit etc; followed by DCherly Hensenfor Cards- his notes are reviewed, stable- no changes made...    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 DrFarbers note indicates that to finish the repair he will require 3 branches and a left renal fenestration, they quoted him a risk of paraplegia in the 10-15% range; [facility] stents placed 04/15/13 in a >4h operation w/ special spinal precautions- he developed a blood clot in his left arm w/ some persist swelling; [facility] doing well;  BNP=184...     Chol> on Lip10; last FLP 12/12 showed TChol 107, TG 38, HDL 40, LDL 60; we discussed return for f/u FLP...    Hypothyroid> we have been following his TSH levels and he has been consistently 6-7 range; we decided to start SYNTHROID567m/d in 2014 and f/u TSH 5/15 on Levothy50 = 1.92    GI- Divertics, Polyps, Hx ischemic bowel> the latter was due to an internal hernia & closed loop obstruction & not from any thrombotic or embolic phenomenon; he has done well since the surgery 7/13...    GU- BPH, hx prostate cancer, Renal insuffic> on Flomax0.4; followed by DrWrenn & he elected XRT- admin by DrKinard in 2009; he has a slowly rising PSA w/ a doubling time of 6-56m52moe is considerinjg androgen ablation if necessary, Cr=1.6... Marland KitchenMarland Kitchen DJD> on Oxy-IR 5mg6md from Pain management; he is s/p bilat TKRs by DrOlin...    Hx Anemia> prev on Fe; Hg increased from 9.5 post op to 13.2 w/ Fe=70 (21% sat) in Oct2013... f/u  labs 5/15 showed Hg=13.2 We reviewed prob list, meds, xrays and labs> see below for updates >>   LABS 5/15:  Chems- ok x BUN=26 Cr=1.6;  CBC- wnl w/ Hg=13.2;  TSH=1.92 on Levothy50;  BNP=184...            Problem List:  COPD (ICD-496) Hx of PNEUMONIA, ORGANISM UNSPECIFIED (ICD-486) PULMONARY NODULE & CAVITY RLL (ICD-518.89) - he is an ex-smoker, having quit in [place] after 40 yrs of smoking... he was exercising regularly and walking daily ~18m 5-6 days per week... on [place], +MUCINEX 2Bid w/ Fluids... ~  baseline CXR & CTChest w/ marked emphysema, atheromatous calcif, 5cm desc thorAA...  ~  CXR & CTA 2/11 in hosp showed cardiomeg + coronary calcif, advanced atherosclerosis of Ao w/ aneuryms 5.8cm into abd, underlying emphysema w/ interst edema & bilat effusions, no PE... ~  4/11:  RLL pneumonia which was slow to clear... ~  6/11:  f/u CXR & CT Chest 6/11 w/ 6cm cavitary area RLL & nodular component inferiorly, +hilar adenopathy, no mediastinal nodes, etc; Tumor  markers showed CEA=7.5 & Ca19-9= 10.8; > IR felt lesion was too hi risk for bx, therefore contin Rx & observe. ~  8/11:  f/u CXR w/o change in RLL opac (no worsening)>> repeat CEA=6.5; plan f/u CT Chest in 214mo~  10/11: f/u CT Chest showed interval decrease in size of RLL cavitary lesion & the inferiorly placed soft tissue component; otherw there is severe emphysema, cardiomeg, coronary calcif, thoracoabd aneurysm w/o change; there was an air-fluid level in a dilated esoph> check Ba Esophagram= Nonspecific esophageal motility disorder with very poor primary esophageal contractions (no HH, stricture, etc)... ~  12/12: f/u CTChest showed severe emphysema & scarring w/ bibasilar atx (no infiltrates no cavities), markedly tortuous & ectaticThorAo w/ extensive atherosclerotic changes & no change in decr thor aneurysm measuring 5.9 x 5.6cm, mild cardiomeg & dense coronary calcif as well... ~  4/13:  He had a fenestrated endovasc [facility] ~  subseq CXRs thru 7/13 hosp w/ cardiomeg, low lung vols, bibasilar airsp dis, etc... ~  CXR 1/14 showed cardiomeg, bibasilar fibrosis, thoracic aortic stent noted, NAD...[name]~  7/14:  ?RUL pneumonia treated w/ Avelox & resolved=> [facility]  CXR 10/14 showed chr changes of Ao stent graft, stable cardiomegaly, no infiltrates, NAD...[name]~  5/15: on Advair250, Spiriva, Mucinex; last CT 12/12 w/ severe emphysema & scarring, plus his extensive atherosclerotic dis/ ectatic thorAo/ aneurysm/ etc; his breathing is stable- mild cough, min sput, no hemoptysis, stable DOE, etc   HYPERTENSION (ICD-401.9)                         << followed by DrCherly Hensenor Cards >> CONGESTIVE HEART FAILURE (ICD-428.0) ATRIAL FIBRILLATION (ICD-427.31) w/ clot in left atrial appendage >> resolved on f/u TEE, on COUMADIN per Cards & cardioverted. PREMATURE VENTRICULAR CONTRACTIONS, FREQUENT (ICD-427.69)  MEDS> prev on ATENOLOL  2535m, & LASIX 12m66mM + KCl 10mE38m, and off Losartan... prev Norvasc discontinued & hx ACE cough in the past on Lisinopril. ~  NuclearStressTest 1/05 was neg- no ischemia or infarct (+diaphrag attenuation), EF=56%... ~  repeat Nuclear study 6/09 was neg- no ischemia, mild inferoapic thinning, not gated due to PVCs... ~  2DEcho 6/09 showed mild dilated LV w/ EF= 45-50% but no regional wall motion abn, mild AoV calcif & AI, LA mild dil... ~  12/10:  changed from [place] to [place] to save $$ ~  2/11:  in hosp- Cath= mild nonobstructive 3 vessel CAD, mod LVD w/ EF=40-45%; TEE= CHF w/ EF~45% w/ HK & left atrial appendage clot & AFib; he was diuresed & Norvasc stopped, placed on Coumadin w/ careful f/u by Cherly Hensen & the CC... ~  Subseq successful [facility] & holding NSR w/ PVCs... ~  Recurrent AFib w/ RVR after his Thoracoabdominal stent graft 4/13... ~  6/13:  BP was low assoc w/ GU bleeding & ATENOLOL25 & LOSARTAN50 placed on HOLD... ~  COUMADIN on HOLD since GU bleeding & acute blood loss anemia; it has not yet been restarted... ~  8/13:  BP= 116/78 on LASIX 2m/d & K10-Bid;  Wt down 14# to 173# ~  10/13:  BP= 114/82 on Lasix40+K1-Bid (he takes Aten25 if BP>110 at home)... ~  12/13: he had Cards f/u DrNishan> note reviewed, no change in meds, he referred him to DrGearhart for f/u stent [facility] ~  5/14: HBP, CHF, AFib w/ hx clot in LA appendage- resolved on Coumadin> on ASA81, Aten25, Lasix40, K10Bid; BP= 114/72 & he denies CP, palpit etc; followed by DCherly Hensenfor Cards- his notes are reviewed, stable- no changes made. ~  8/14: [facility] by DrFarber... ~  5/15: on ASA81, Coumadin, Aten25 (if BP>110), Losartan100, Lasix40; BP= 120/64 & he denies CP, palpit etc; followed by DCherly Hensenfor Cards- his notes are reviewed, stable- BNP 5/15= 184...  PERIPHERAL VASCULAR DISEASE (ICD-443.9) - back on ASA 852md... he is s/p infrarenal  AAA repair w/ right common iliac aneurysm repair (via Ao Bi-iliac graft) in 1998 by DrLawson... also has a known desc thor AA measuring ~5+cm and followed by DrGearhart...  ~  seen by DrGearhart 4/10 w/ CT scan showing ~5cm thoracoabdominal aneurysm w/o signif change...  ~  seen by DrGearhart 2/11 in hosp w/ sl incr size of AAA- f/u planned 51m54mo  also followed by [facility]- DrCooper. ~  seen by DrGearhardt 10/11 & stable, no change, f/u 1 yr. ~  Seen 12/12 by DrGearhardt & CTscan is stable, continue conservative approach... ~  2/13:  [facility]Farber... ~  4/13:  He had a fenestrated endovasc repair of [facility]abdominal AA [facility] by lumbar epidural hematomas (on Coumadin) w/ paraplegia requiring readmission for laminectomy & evac of the hematomas;  [facility] by [facility] w/ altered mental status, pneumonia, AFib w/ rvr, & FTT> Neuro w/u was otherw neg (& he improved w/ supportive care)... ~  2/14:  CT Chest, Abdomen, Pelvis> per DrBrabham - see report (the abdominal component has enlarged)... ~  [facility] of the [facility]neurys[facility] repair=> 5 stents placed by DrFarber 7/14...  HYPERCHOLESTEROLEMIA (ICD-272.0) - on LIPITOR 20m58m..  ~  FLP [place] showed TChol 115, Tg 58, HDL 36, LDL 67 ~  FLP 5/08 showed TChol 118, TG 66, HDL 31, LDL 74 ~  FLP 5/09 showed TChol 126, TG 71, HDL 37, LDL 75 ~  FLP 6/10 showed TChol 116, TG 73, HDL 40, LDL 62 ~  2/11:  FLP not checked during the hospitalization... ~  FLP [place]  in hosp showed TChol 94, TG 52, HDL 23, LDL 61 ~  FLP 12/12 on Lip10 showed TChol 107, TG 38, HDL 40, LDL 60 ~  He needs to ret for FASTING blood work...  HYPOTHYROIDISM >> he remains clinically euthyroid... ~  Labs 12/12 showed TSH= 6.10 ~  Labs 6/13 showed TSH= 6.66 ~  Labs  5/14 showed TSH= 6.21... We decided to start SYNTHROID 58mg/d... ~  Labs 5/15 on Levothy50 showed TSH= 1.92  DIVERTICULOSIS OF COLON (ICD-562.10) & COLONIC POLYPS (ICD-211.3) - hx polyps in 2003 = tubular adenoma... ~  last colonoscopy 9/06 showed divertics, hems, no polyps.  ISCHEMIC BOWEL >> GKeastonpresented to the ER 7/23 - 04/25/12 w/ abd pain- ELap surg revealed ~5 feet of frankly ischemic small bowel, likely due to internal hernia and closed loop obstruction=> Bowel resected & pulses intact w/ no evidence of embolic or thrombotic pathology;  Viable small bowel anastomosed & wound vac applied; he had a stable post op course...  RENAL INSUFFICIENCY >>  PROSTATE CANCER (ICD-185) - eval by DrWrenn and they decided on XRT by DrKinard- finished 5/09 and he states that DrKinard "released me"... he sees DrWrenn every 6 months & they follow PSA closely (notes reviewed)... ~  8/12: f/u prostate cancer dx in 2009 & treated w/ XRT completed 6/09; slowly rising PSA w/ doubling time 6-921momin voiding symptoms, he is considering androgen ablation if needed... ~  6/13: He developed hematuria & urinary retention requiring Urology eval, discontinuation of Coumadin, & Foley placement=> subseq removed 8/13 w/ adeq voiding trial... ~  3/14:  He had f/u visit w/ DrWrenn- PSA nadir after XRT was <0.04 and it has climbed to 0.54 (3/14) w/ PSADT of ~1y31yrhey chose to continue watchful waiting... ~  Labs 5/15 on Lasix40 showed Cr=1.6, BNP=184  DEGENERATIVE JOINT DISEASE (ICD-715.90) - s/p bilat TKR's, [facility]- DrOlin... He has OXY-IR 5mg62m needed for pain...  ACTINIC SKIN DAMAGE (ICD-692.70) - followed by DrHoBrunetta Jeans knows to avoid sun exposure, use sun screen, etc...  ANEMIA >> ~  Labs 6/13 showed Hg= 8.4, Fe= 31 (11%sat); Rec to start FeSO4 Bid... ~  Labs 7/13 in hosp showed Hg= 12.6=>8.2 at disch... ~  Labs 8/13 showed Hg= 9.5, MCV= 94; rec to continue FeSO4 Bid... ~  Labs 10/13 showed Hg= 13.2 & ok to  wean Fe to 1/d til gone then stop... ~  Labs 5/14 showed Hg= 14.2 ~  Labs 5/15 showed Hg= 13.2   Past Surgical History  Procedure Laterality Date  . Total knee arthroplasty      bilateral  . Abdominal aortic aneurysm repair  1998  . Appendectomy    . Inguinal hernia repair      left  . Joint replacement    . Laparotomy  04/16/2012    Procedure: EXPLORATORY LAPAROTOMY;  Surgeon: BriaMadilyn Hook;  [place] [facility] [place];  Laterality: N/A;  exploratory Laparotomy,Small bowel ressection abdominal wound vac placement.  . Laparotomy  04/18/2012    Procedure: EXPLORATORY LAPAROTOMY;  Surgeon: ThomJoyice Fasterrnett, MD;  Location: [facility] [place]: General;  Laterality: N/A;  exploratory laparotomy with small bowel anastomosis  . Application of wound vac  04/18/2012    Procedure: APPLICATION OF WOUND VAC;  Surgeon: ThomJoyice Fasterrnett, MD;  [place] [facility] [place];  Laterality: N/A;  Removal of abdominal wound vac, Application of incisional  wound vac  . Repair of aa  04/14/2013    done at  [place]    Outpatient Encounter Prescriptions as of 02/09/2014  Medication Sig  . acetaminophen (TYLENOL) 500 MG tablet Take 500 mg by mouth every 6 (six) hours as needed.  [name] aspirin 81 MG tablet Take 81 mg by mouth daily.   [name] atenolol (TENORMIN) 25 MG tablet TAKE 1 TABLET BY MOUTH AS NEEDED (IF BLOOD PRESSURE IS OVER 110)  . atorvastatin (LIPITOR) 10 MG tablet Take 1 tablet (10 mg total) by mouth daily.  . Fluticasone-Salmeterol (ADVAIR DISKUS) 250-50 MCG/DOSE AEPB inhale 1 dose by mouth twice a day  . furosemide (LASIX) 40 MG tablet Take 1 tablet (40 mg total) by mouth daily.  [name] guaiFENesin (MUCINEX) 600 MG 12 hr tablet Take 600 mg by mouth 2 (two) times daily. COPD.  [name] HYDROcodone-acetaminophen (NORCO/VICODIN) 5-325 MG per tablet Take 1 tablet by mouth 3 (three) times daily as needed for moderate pain.  [name] levothyroxine (SYNTHROID, LEVOTHROID) 50 MCG tablet Take 1 tablet (50 mcg total)  by mouth daily before breakfast.  . losartan (COZAAR) 100 MG tablet Take 1 tablet (100 mg total) by mouth daily.  . Multiple Vitamin (MULTIVITAMIN) tablet Take 1 tablet by mouth daily.    . tamsulosin (FLOMAX) 0.4 MG CAPS capsule Take 1 capsule (0.4 mg total) by mouth at bedtime.  [name] tiotropium (SPIRIVA HANDIHALER) 18 MCG inhalation capsule Place 1 capsule (18 mcg total) into inhaler and inhale daily.  [name] tiZANidine (ZANAFLEX) 4 MG capsule Take 1 capsule (4 mg total) by mouth 3 (three) times daily as needed for muscle spasms.  . traMADol (ULTRAM) 50 MG tablet Take 1 tablet (50 mg total) by mouth 3 (three) times daily as needed for pain.  [name] warfarin (COUMADIN) 2.5 MG tablet 1 tablet everyday except 1/2 tablet on Mondays and Fridays. Or as directed by coumadin clinic    No Known Allergies   Current Medications, Allergies, Past Medical History, Past Surgical History, Family History, and Social History were reviewed in [employer] record.   Review of Systems        See HPI - all other systems neg except as noted...  The patient complains of dyspnea on exertion.  The patient denies anorexia, fever, weight loss, weight gain, vision loss, decreased hearing, hoarseness, chest pain, syncope, peripheral edema, prolonged cough, headaches, hemoptysis, abdominal pain, melena, hematochezia, severe indigestion/heartburn, hematuria, incontinence, muscle weakness, suspicious skin lesions, transient blindness, difficulty walking, depression, unusual weight change, abnormal bleeding, enlarged lymph nodes, and angioedema.     Objective:   Physical Exam    WD, WN, 80 y/o WM in NAD... GENERAL:  Alert & oriented; pleasant & cooperative; pale complexion... HEENT:  [place]/AT, EOM-wnl, PERRLA, EACs-clear, TMs-wnl, NOSE-clear, THROAT-clear & wnl. NECK:  Supple w/ fairROM; no JVD; normal carotid impulses w/o bruits; no thyromegaly or nodules palpated; no lymphadenopathy. CHEST:  decr BS bilat, scat  bibasilar rales, w/o wheezing/ rhonchi/ signs of consolidation. HEART:  irregular rhythm, gr1/6 diast murmur in Ao area, without rubs or gallops detected... ABDOMEN:  Soft & nontender; normal bowel sounds; no organomegaly or masses palpated... EXT: s/p bilat TKR's, mod arthritic changes; no varicose veins/ +venous insuffic/ no edema. NEURO:  CN's intact;  gait abn; no focal neuro deficits... DERM:  No lesions noted; no rash etc...  RADIOLOGY DATA:  Reviewed in the EPIC EMR & discussed w/ the patient...  LABORATORY DATA:  Reviewed in the EPIC EMR & discussed w/ the patient...   Assessment:      COPD, Hx Pneumonia>  baseline  severe COPD/Emphysema w/ Cardiomeg, Aneurysm & bibasilar fibrosis, stable on Advair250, Spiriva, Mucinex...  HBP>  controlled on Losartan100, Lasix40, off KCl;  He uses Aten25 "as needed for BP>110 but says that he "seldom takes this med"...  CAD/ CHF/ etc>  Followed by Cherly Hensen & his prev notes are reviewed;  BNP in ER 12/14 was up to 4200 & on Lasix40 it has improved to 184 by 5/15...  AFib>  On above + Coumadin followed in the [facility]...  Periph Vasc Dis>  S/p AAA repair 1998 by Sheryn Bison; then followed by DrGearhardt for thoraco-abd aneurysm & referred to [facility] for stent graft surg- done 4/13 by DrFarber w/ complications, and 2nd stage of the surgery 7/14 w/ 5 stents placed per DrFarber...  Ven Insuffic & EDEMA>  Improved w/ Lasix40/d... He knows to elim salt, elev legs, wear support hose, etc...  CHOL>  FLP looks good on Lip10...  Subclinical Hypothyroid>  TSH was 6.21 and Synthroid50 started 5/14=> TSH improved to 1.92...  GI> Presbyesoph, Divertics, Hx polpyps>  Stable & followed by DrDBrodie... 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  Prostate Cancer>  Followed by DrWrenn  w/ slowly rising PSA as noted; then developed urinary retention after lumbar surg and  hematuria after foley & coumadin; now back to baseline & being followed...  DJD>  Followed by DrOlin, s/p bilat TKRs...  ANEMIA>  He developed acute blood loss anemia w/ post op Hg= 11-12 but dropped to 8.6 w/ hematuria & INR=4.2; Coumadin & ASA placed on HOLD; f/u labs showed Hg=8.4 & Fe=31 & started on FeSO4 Bid;  Then Hg=9.5 & rec to continue Fe Bid;  Cards restarted his Coumadin;  Now Hg= 13.2 & Fe improved, ok to wean off Fe...  Actinic skin changes>  Aware, he is monitored by Derm...     Plan:     Patient's Medications  New Prescriptions   No medications on file  Previous Medications   ACETAMINOPHEN (TYLENOL) 500 MG TABLET    Take 500 mg by mouth every 6 (six) hours as needed.   ASPIRIN 81 MG TABLET    Take 81 mg by mouth daily.    ATENOLOL (TENORMIN) 25 MG TABLET    TAKE 1 TABLET BY MOUTH AS NEEDED (IF BLOOD PRESSURE IS OVER 110)   ATORVASTATIN (LIPITOR) 10 MG TABLET    Take 1 tablet (10 mg total) by mouth daily.   FLUTICASONE-SALMETEROL (ADVAIR DISKUS) 250-50 MCG/DOSE AEPB    inhale 1 dose by mouth twice a day   FUROSEMIDE (LASIX) 40 MG TABLET    Take 1 tablet (40 mg total) by mouth daily.   GUAIFENESIN (MUCINEX) 600 MG 12 HR TABLET    Take 600 mg by mouth 2 (two) times daily. COPD.   LEVOTHYROXINE (SYNTHROID, LEVOTHROID) 50 MCG TABLET    Take 1 tablet (50 mcg total) by mouth daily before breakfast.   LOSARTAN (COZAAR) 100 MG TABLET    Take 1 tablet (100 mg total) by mouth daily.   MULTIPLE VITAMIN (MULTIVITAMIN) TABLET    Take 1 tablet by mouth daily.     TAMSULOSIN (FLOMAX) 0.4 MG CAPS CAPSULE    Take 1 capsule (0.4 mg total) by mouth at bedtime.   TIOTROPIUM (SPIRIVA HANDIHALER) 18 MCG INHALATION CAPSULE    Place 1 capsule (18 mcg total) into inhaler and inhale daily.   TRAMADOL (ULTRAM) 50 MG TABLET  Take 1 tablet (50 mg total) by mouth 3 (three) times daily as needed for pain.   WARFARIN (COUMADIN) 2.5 MG TABLET    1 tablet everyday except 1/2 tablet on Mondays and Fridays.  Or as directed by coumadin clinic  Modified Medications   No medications on file  Discontinued Medications   HYDROCODONE-ACETAMINOPHEN (NORCO/VICODIN) 5-325 MG PER TABLET    Take 1 tablet by mouth 3 (three) times daily as needed for moderate pain.   TIZANIDINE (ZANAFLEX) 4 MG CAPSULE    Take 1 capsule (4 mg total) by mouth 3 (three) times daily as needed for muscle spasms.

## 2014-02-09 NOTE — Patient Instructions (Signed)
Today we updated your med list in our EPIC system...    Continue your current medications the same...  Today we rechecked your blood work...    We will contact you w/ the results when available...   Let us know if we can be of assist w/ your meds, donut hole, etc...   Call for any questions...  Let's plan a follow up visit in 36mo, sooner if needed for problems.Marland KitchenMarland Kitchen

## 2014-02-11 ENCOUNTER — Telehealth: Payer: Self-pay | Admitting: Pulmonary Disease

## 2014-02-11 NOTE — Telephone Encounter (Signed)
Called and spoke with pts son and he is aware of lab results per SN.  Pt voiced his understanding and nothing further is needed.

## 2014-03-06 ENCOUNTER — Ambulatory Visit (INDEPENDENT_AMBULATORY_CARE_PROVIDER_SITE_OTHER): Payer: Medicare Other | Admitting: *Deleted

## 2014-03-06 DIAGNOSIS — I4891 Unspecified atrial fibrillation: Secondary | ICD-10-CM

## 2014-03-06 LAB — POCT INR: INR: 2.6

## 2014-03-20 ENCOUNTER — Other Ambulatory Visit: Payer: Self-pay | Admitting: Urology

## 2014-03-20 DIAGNOSIS — C61 Malignant neoplasm of prostate: Secondary | ICD-10-CM

## 2014-04-01 NOTE — Progress Notes (Signed)
Old order, was done 1 yr ago

## 2014-04-02 ENCOUNTER — Other Ambulatory Visit: Payer: Self-pay | Admitting: Pulmonary Disease

## 2014-04-07 ENCOUNTER — Telehealth: Payer: Self-pay | Admitting: Pulmonary Disease

## 2014-04-07 NOTE — Telephone Encounter (Signed)
Per SN: needs OV tomorrow to check on this or they can go to the ED if severe now It's up to them

## 2014-04-07 NOTE — Telephone Encounter (Signed)
Pt son reports that the patient had a fall x 2 weeks ago injuring left leg. Bruise on left leg has since resolved leaving behind a red discoloration from knee to ankle. Coralyn Mark (son) is concerned that this might be phlebitis as it is very red in color from knee to ankle, swollen, skin is tight and is warm to the touch. Coralyn Mark states that patient keeps repeating that his leg feels very tight.  Using OTC Advil for the swelling.   Please advise Dr Lenna Gilford. Thanks.

## 2014-04-07 NOTE — Telephone Encounter (Signed)
I called spoke with pt. appt scheduled to see SN tomorrow. Nothing further needed

## 2014-04-08 ENCOUNTER — Encounter: Payer: Self-pay | Admitting: Pulmonary Disease

## 2014-04-08 ENCOUNTER — Ambulatory Visit (HOSPITAL_COMMUNITY): Payer: Medicare Other | Attending: Cardiovascular Disease | Admitting: *Deleted

## 2014-04-08 ENCOUNTER — Ambulatory Visit (INDEPENDENT_AMBULATORY_CARE_PROVIDER_SITE_OTHER): Payer: Medicare Other | Admitting: Pulmonary Disease

## 2014-04-08 ENCOUNTER — Other Ambulatory Visit (INDEPENDENT_AMBULATORY_CARE_PROVIDER_SITE_OTHER): Payer: Medicare Other

## 2014-04-08 VITALS — BP 110/60 | HR 100 | Temp 97.4°F | Ht 69.0 in | Wt 181.8 lb

## 2014-04-08 DIAGNOSIS — J449 Chronic obstructive pulmonary disease, unspecified: Secondary | ICD-10-CM | POA: Insufficient documentation

## 2014-04-08 DIAGNOSIS — R609 Edema, unspecified: Secondary | ICD-10-CM | POA: Insufficient documentation

## 2014-04-08 DIAGNOSIS — M7989 Other specified soft tissue disorders: Secondary | ICD-10-CM

## 2014-04-08 DIAGNOSIS — I4891 Unspecified atrial fibrillation: Secondary | ICD-10-CM

## 2014-04-08 DIAGNOSIS — I4819 Other persistent atrial fibrillation: Secondary | ICD-10-CM

## 2014-04-08 DIAGNOSIS — N289 Disorder of kidney and ureter, unspecified: Secondary | ICD-10-CM

## 2014-04-08 DIAGNOSIS — M1990 Unspecified osteoarthritis, unspecified site: Secondary | ICD-10-CM

## 2014-04-08 DIAGNOSIS — I509 Heart failure, unspecified: Secondary | ICD-10-CM

## 2014-04-08 DIAGNOSIS — I10 Essential (primary) hypertension: Secondary | ICD-10-CM

## 2014-04-08 DIAGNOSIS — I739 Peripheral vascular disease, unspecified: Secondary | ICD-10-CM | POA: Insufficient documentation

## 2014-04-08 DIAGNOSIS — I5022 Chronic systolic (congestive) heart failure: Secondary | ICD-10-CM

## 2014-04-08 DIAGNOSIS — I712 Thoracic aortic aneurysm, without rupture, unspecified: Secondary | ICD-10-CM

## 2014-04-08 DIAGNOSIS — M79609 Pain in unspecified limb: Secondary | ICD-10-CM

## 2014-04-08 DIAGNOSIS — M545 Low back pain, unspecified: Secondary | ICD-10-CM

## 2014-04-08 DIAGNOSIS — I872 Venous insufficiency (chronic) (peripheral): Secondary | ICD-10-CM

## 2014-04-08 DIAGNOSIS — E785 Hyperlipidemia, unspecified: Secondary | ICD-10-CM | POA: Insufficient documentation

## 2014-04-08 DIAGNOSIS — C61 Malignant neoplasm of prostate: Secondary | ICD-10-CM

## 2014-04-08 DIAGNOSIS — J4489 Other specified chronic obstructive pulmonary disease: Secondary | ICD-10-CM

## 2014-04-08 DIAGNOSIS — F17200 Nicotine dependence, unspecified, uncomplicated: Secondary | ICD-10-CM | POA: Insufficient documentation

## 2014-04-08 LAB — BASIC METABOLIC PANEL
BUN: 33 mg/dL — ABNORMAL HIGH (ref 6–23)
CO2: 28 mEq/L (ref 19–32)
Calcium: 9.1 mg/dL (ref 8.4–10.5)
Chloride: 105 mEq/L (ref 96–112)
Creatinine, Ser: 2 mg/dL — ABNORMAL HIGH (ref 0.4–1.5)
GFR: 34.44 mL/min — ABNORMAL LOW (ref >60.00)
Glucose, Bld: 91 mg/dL (ref 70–99)
Potassium: 4.7 mEq/L (ref 3.5–5.1)
Sodium: 140 mEq/L (ref 135–145)

## 2014-04-08 LAB — CBC WITH DIFFERENTIAL/PLATELET
Basophils Absolute: 0.1 10*3/uL (ref 0.0–0.1)
Basophils Relative: 0.9 % (ref 0.0–3.0)
Eosinophils Absolute: 0.1 10*3/uL (ref 0.0–0.7)
Eosinophils Relative: 1.5 % (ref 0.0–5.0)
HCT: 36.6 % — ABNORMAL LOW (ref 39.0–52.0)
Hemoglobin: 12.2 g/dL — ABNORMAL LOW (ref 13.0–17.0)
Lymphocytes Relative: 15.7 % (ref 12.0–46.0)
Lymphs Abs: 1.1 10*3/uL (ref 0.7–4.0)
MCHC: 33.2 g/dL (ref 30.0–36.0)
MCV: 94.4 fl (ref 78.0–100.0)
Monocytes Absolute: 0.7 10*3/uL (ref 0.1–1.0)
Monocytes Relative: 9.9 % (ref 3.0–12.0)
Neutro Abs: 5 10*3/uL (ref 1.4–7.7)
Neutrophils Relative %: 72 % (ref 43.0–77.0)
Platelets: 227 10*3/uL (ref 150.0–400.0)
RBC: 3.88 Mil/uL — ABNORMAL LOW (ref 4.22–5.81)
RDW: 14.5 % (ref 11.5–15.5)
WBC: 6.9 10*3/uL (ref 4.0–10.5)

## 2014-04-08 LAB — D-DIMER, QUANTITATIVE: D-Dimer, Quant: 2.5 ug/mL-FEU — ABNORMAL HIGH (ref 0.00–0.48)

## 2014-04-08 LAB — SEDIMENTATION RATE: Sed Rate: 37 mm/hr — ABNORMAL HIGH (ref 0–22)

## 2014-04-08 LAB — C-REACTIVE PROTEIN: CRP: 4.4 mg/dL (ref 0.5–20.0)

## 2014-04-08 LAB — PROTIME-INR
INR: 4.6 ratio — ABNORMAL HIGH (ref 0.8–1.0)
Prothrombin Time: 48.8 s — ABNORMAL HIGH (ref 9.6–13.1)

## 2014-04-08 NOTE — Progress Notes (Signed)
Subjective:     Patient ID: Ryan Hess, male   DOB: 12/25/1923, 80 y.o.   MRN: [id number]  HPI 80 y/o WM here for a follow up visit... he has multiple medical problems as noted below...   ~  SEE PREV EPIC NOTES FOR OLDER DATA >>   ~  July 08, 2012:  6wk ROV & [name] is stable, slowly gaining strength, wt up 3# to 176# today, BP well regulated on his Lasix40 & takes the Aten25 only if BP>110;  Breathing stable on Advair250 + Spiriva & he uses the Mucinex as needed;  Due for f/u CBC & Fe on his FeSO4 355mBid...    We reviewed prob list, meds, xrays and labs> see below for updates >> OK Flu vaccine today...  LABS 10/13:  Chems- wnl;  CBC- improved w/ Hg=13.2 & Fe looks good etc...  ~  October 07, 2012:  316moOV & GeNeedhams much better, stable, just c/o some back discomfort;  We reviewed the following medical problems during today's office visit >>     COPD, HxPneumonia> on Advair250, Spiriva, Mucinex; last CT 12/12 w/ severe emphysema & scarring, plus his extensive atherosclerotic dis/ ectatic thorAo/ aneurysm/ etc; his breathing is stable- mild cough, min sput, no hemoptysis, stable DOE, etc...     HBP, CHF, AFib w/ hx clot in LA appendage- resolved on Coumadin> on ASA81, Aten25, Lasix40, K10Bid; followed by DrBurkina Fasoor Cards- seen 12/13 & his note is reviewed, no changes made...    Peripheral Vasc Dis> on ASA81 & Coumadin; followed by DrGearhardt for [facility]urg  [facility] (see extensive hx below)...    Chol> on Lip10; last FLP 12/12 showed TChol 107, TG 38, HDL 40, LDL 60    GI- Divertics, Polyps, Hx ischemic bowel> the latter was due to an internal hernia & closed loop obstruction & not from any thrombotic or embolic phenomenon; he has done well since the surgery...    GU- prostate cancer> followed by DrWrenn & he elected XRT- admin by DrKinard in 2009; he has a slowly rising PSa w/ a doubling time of 6-13m53moe is considerinjg androgen ablation if necessary...    DJD> on  Oxy-IR 5mg25mn; he is s/p bilat TKRs by DrOlin    Hx Anemia> on Fe &VI- Hg increased from 9.5 post op to 13.2 w/ Fe=70 (21% sat) in Oct2013... We reviewed prob list, meds, xrays and labs> see below for updates >>   CXR 1/14 showed cardiomegaly, aortic stent stable, clear lungs w/ basilar scarring, NAD...   ~  Feb 04, 2013:  36mo 53mo& [name] [facility] in his planned thoracoabd aneurysm repair- he had his situation reviewed by DrBrabham here & [facility] since this needs to be done endovascularly & is quite complicated... We reviewed the following medical problems during today's office visit >>     COPD, HxPneumonia> on Advair250, Spiriva, Mucinex; last CT 12/12 w/ severe emphysema & scarring, plus his extensive atherosclerotic dis/ ectatic thorAo/ aneurysm/ etc; his breathing is stable- mild cough, min sput, no hemoptysis, stable DOE, etc...     HBP, CHF, AFib w/ hx clot in LA appendage- resolved on Coumadin> on ASA81, Aten25, Lasix40, K10Bid; BP= 114/72 & he denies CP, palpit etc; followed by DrNisCherly HensenCards- his notes are reviewed, stable- no changes made...    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 DrFarbers note indicates that to finish the repair he will require 3 branches and a left renal fenestration, they quoted him a risk of paraplegia in the 10-15% range & he will need preadmission for a lumbar drain to be placed, etc; he is awaiting the call from [facility]...    Chol> on Lip10; last FLP 12/12 showed TChol 107, TG 38, HDL 40, LDL 60    Hypothyroid> we have been following  his TSH levels and he has been consistently 6-7 range; Labs 5/14 showed TSH=6.21 & we decided to start SYNTHROID58mg/d     GI- Divertics, Polyps, Hx ischemic bowel> the latter was due to an internal hernia & closed loop obstruction & not from any thrombotic or embolic phenomenon; he has done well since the surgery 7/13...    GU- BPH, hx prostate cancer, Renal insuffic> on Flomax0.4; followed by DrWrenn & he elected XRT- admin by DrKinard in 2009; he has a slowly rising PSA w/ a doubling time of 6-912mohe is considerinjg androgen ablation if necessary, Cr=1.5...Marland KitchenMarland Kitchen  DJD> on Oxy-IR 43m19mrn; he is s/p bilat TKRs by DrOlin...    Hx Anemia> prev on Fe; Hg increased from 9.5 post op to 13.2 w/ Fe=70 (21% sat) in Oct2013... We reviewed prob list, meds, xrays and labs> see below for updates >>   LABS 5/14:  Chems- ok w/ stable mild RI (Cr=1.5);  CBC- wnl w/ Hg=14.2;  TSH=6.21 (Synthroid50 started);  BNP=263;  Protimes adjusted by the CC...   ~  May 19, 2013:  41mo44mo & GeorSequoia [facility] in a >4h operation w/ special spinal precautions he says; he developed a blood clot in his left arm w/ some persist swelling; [facility] labs done [facility] week (we do not yet have surgical or post op notes [facility] that [facility] but DrFarber wants to see better flow throught the areas of concern;  He has lost 7# down to 174# today;  BP looks good at 110/64 and O2 sats are 97% on RA at rest;  His CC is pain, esp back pain & he does not want to take narcotics- out of Percocet & he prefers Tramadol50 up to Tid & a musc relaxer- best covered is Tizanidine4mg 22m as needed...  ~  August 19, 2013:  41mo R67mo GeorgeMikio some LBP after trying to lift wife several weeks ago; he has Tramadol Tid & using heat, we will refill Tizanidine and rec f/u w/ Ortho if not improving for poss shot... Otherw  stable, doing satis overall...     Breathing is stable on Advair, spiriva, & Mucinex...    BP & Cardiovasc stable on Aten, Lasix, Coumadin...     Lipids stable on Lip10...     Thyroid stable on Synthroid50=> due for f/u TSH but wants to wait til next OV for FASTING blood work...    GU, BPH are stable on flomax0.4mg/d.14m We reviewed prob list, meds, xrays and labs> see below for  updates >> he had the 2014 Flu vaccine 10/14...   CXR 10/14 showed chr changes of Ao stent graft, stable cardiomegaly, no infiltrates, NAD...   LABS 10/14:  CBC- wnl;  Protimes followed in CC...    ~  September 11, 2013:  3wk ROV & Elon is added-on at son's request for 2 issues>>    1st> son called for refill of Losartan which we did not have on his list; son notes that after [facility]14 that his BP has been higher, they did not disch him on any BP meds, so about 52moago he started his dad on an old prescription for Losartan100; when he was here 11/25 & his BP read 118/62 he was taking the Losartan100 & Lasix40 (he has Aten25 but "very seldom" takes that & it is written for 1/d prn BP>110);  Today is BP= 120/60 on the Lasix40 & Losartan100- asked to continue the same 7 Rx written for these...    2nd> he had sudden pleuritic right CP 12/14 & went to the ER- described as sharp, worse w/ DB but also worse w/ palp on right lower chest wall; no SOB, no sput, ?min streaky hemoptysis; ER eval showed norm resp effort, tender chest wall, no rubs; CXR w/o active dis and CTA showed no evid PE (see full reports for all the chronic changes); Labs were ok x BNP=4200; he reports prompt resolution of the CP w/ MS x2 and Toradol x1; the pain has not returned, and as a fringe benefit- the Vicodin he was given via the Er has really helped his daily LBP (his chief complaint...    We reviewed prob list, meds, xrays and labs> see below for updates >> we refilled Hydrocodone5-325, Losartan100, Lasix40, Flomax0.4..Marland KitchenMarland KitchenHe has planned  cardiac f/u soon to address the BNP of 4200 on Lasix40 w/ BUN=26 Cr=1.3  ~  Feb 09, 2014:  520moOV & GeTimtohyas been quite stable, breathing has been good but they are concerned regarding report of donut hole med cost of $800/mo or more... [facility] & was told that everything looks great... We reviewed the following medical problems during today's office visit >>     COPD, HxPneumonia> on Advair250, Spiriva, Mucinex; last CT 12/12 w/ severe emphysema & scarring, plus his extensive atherosclerotic dis/ ectatic thorAo/ aneurysm/ etc; his breathing is stable- mild cough, min sput, no hemoptysis, stable DOE, etc...     HBP, CHF, AFib w/ hx clot in LA appendage- resolved on Coumadin> on ASA81, Coumadin, Aten25 (if BP>110), Losartan100, Lasix40; BP= 120/64 & he denies CP, palpit etc; followed by DrCherly Hensenor Cards- his notes are reviewed, stable- no changes made...    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 DrFarbers note indicates that to finish the repair he will require 3 branches and a left renal fenestration, they quoted him a risk of paraplegia in the 10-15% range; [facility]/ 5 stents placed 04/15/13 in a >4h operation w/ special spinal precautions- he developed a blood clot in his left arm w/ some persist swelling; he has  reg f/u [facility], seen 5/15 & doing well; BNP=184...     Chol> on Lip10; last FLP 12/12 showed TChol 107, TG 38, HDL 40, LDL 60; we discussed return for f/u FLP...    Hypothyroid> we have been following his TSH levels and he has been consistently 6-7 range; we decided to start  SYNTHROID51mg/d in 2014 and f/u TSH 5/15 on Levothy50 = 1.92    GI- Divertics, Polyps, Hx ischemic bowel> the latter was due to an internal hernia & closed loop obstruction & not from any thrombotic or embolic phenomenon; he has done well since the surgery 7/13...    GU- BPH, hx prostate cancer, Renal insuffic> on Flomax0.4; followed by DrWrenn & he elected XRT- admin by DrKinard in 2009; he has a slowly rising PSA w/ a doubling time of 6-969mohe is considerinjg androgen ablation if necessary, Cr=1.6...Marland KitchenMarland Kitchen  DJD> on Oxy-IR 83m39mid from Pain management; he is s/p bilat TKRs by DrOlin...    Hx Anemia> prev on Fe; Hg increased from 9.5 post op to 13.2 w/ Fe=70 (21% sat) in Oct2013... f/u labs 5/15 showed Hg=13.2 We reviewed prob list, meds, xrays and labs> see below for updates >>   LABS 5/15:  Chems- ok x BUN=26 Cr=1.6;  CBC- wnl w/ Hg=13.2;  TSH=1.92 on Levothy50;  BNP=184...   ~  April 08, 2014:  40mo83mo & add-on appt requested for swelling in left leg> pt reports that he fell going to the bathroom ~2wks ago w/ bruise on left brow, left hand & left leg; the bruising has since resolved but son noticed that left leg is red in the skin & leg is swollen to twice the size of the right leg, sl tender, not warm/hot, no open areas or drainage;  He has chronic venous insuffic & some mild edema but the redness is new & the L>R leg swelling is new;  He is on chr coumadin rx due to his AFib & [facility].83mg 10ms- taking 1x5d=TWThSS and 1/2 x2d=MF; last Protime was 03/06/14 w/ INR=2.6, kept the same & asked to f/u 04/17/14... We reviewed prob list, meds, xrays and labs> see below for updates >>   LABS 7/15:  Chems- ok x BUN=33 Cr=2.0;  CBC- ok w/ Hg=12.2 WBC=6.9;  Protime=49sec, INR=4.8;  Sed=37  D-Dimer=2.50  CRP=4.4  VenDopplers 7/15 showed NEG for DVT... REC> NO COUMADIN TODAY, we will call result to the CC for med adjustment & f/u; Rec incr water, no salt, elev legs & continue  Lasix20; if swelling persists he will need [facility]         Problem List:  COPD (ICD-496) Hx of PNEUMONIA, ORGANISM UNSPECIFIED (ICD-486) PULMONARY NODULE & CAVITY RLL (ICD-518.89) - he is an ex-smoker, having quit in 1990 [place] 40 yrs of smoking... he was exercising regularly and walking daily ~1mi 5783mdays per week... on ADVAIRLacy-[place]akeview, +MUCINEX 2Bid w/ Fluids... ~  baseline CXR & CTChest w/ marked emphysema, atheromatous calcif, 5cm desc thorAA...  ~  CXR & CTA 2/11 in hosp showed cardiomeg + coronary calcif, advanced atherosclerosis of Ao w/ aneuryms 5.8cm into abd, underlying emphysema w/ interst edema & bilat effusions, no PE... ~  4/11:  RLL pneumonia which was slow to clear... ~  6/11:  f/u CXR & CT Chest 6/11 w/ 6cm cavitary area RLL & nodular component inferiorly, +hilar adenopathy, no mediastinal nodes, etc; Tumor  markers showed CEA=7.5 & Ca19-9= 10.8; > IR felt lesion was too hi risk for bx, therefore contin Rx & observe. ~  8/11:  f/u CXR w/o change in RLL opac (no worsening)>> repeat CEA=6.5; plan f/u CT Chest in 36mo ~  10/11: f/u CT Chest showed interval decrease in size of RLL cavitary lesion & the inferiorly placed soft tissue component; otherw there is severe emphysema, cardiomeg, coronary calcif, thoracoabd aneurysm w/o change; there was an air-fluid level in a dilated esoph> check Ba Esophagram= Nonspecific esophageal motility disorder with very poor primary esophageal contractions (no HH, stricture, etc)... ~  12/12: f/u CTChest showed severe emphysema & scarring w/ bibasilar atx (no infiltrates no cavities), markedly tortuous & ectaticThorAo w/ extensive atherosclerotic changes & no change in decr thor aneurysm measuring 5.9 x 5.6cm, mild cardiomeg & dense coronary calcif as well... ~  4/13:  He had a fenestrated endovasc [facility] ~  subseq CXRs thru 7/13 hosp w/ cardiomeg, low lung vols,  bibasilar airsp dis, etc... ~  CXR 1/14 showed cardiomeg, bibasilar fibrosis, thoracic aortic stent noted, NAD..[name] ~  7/14:  ?RUL pneumonia treated w/ Avelox & resolved=> [facility]  CXR 10/14 showed chr changes of Ao stent graft, stable cardiomegaly, no infiltrates, NAD..[name] ~  5/15: on Advair250, Spiriva, Mucinex; last CT 12/12 w/ severe emphysema & scarring, plus his extensive atherosclerotic dis/ ectatic thorAo/ aneurysm/ etc; his breathing is stable- mild cough, min sput, no hemoptysis, stable DOE, etc   HYPERTENSION (ICD-401.9)                         << followed by DCherly Hensenfor Cards >> CONGESTIVE HEART FAILURE (ICD-428.0) ATRIAL FIBRILLATION (ICD-427.31) w/ clot in left atrial appendage >> resolved on f/u TEE, on COUMADIN per Cards & cardioverted. PREMATURE VENTRICULAR CONTRACTIONS, FREQUENT (ICD-427.69)  MEDS> prev on ATENOLOL 252md, & LASIX 202mAM + KCl 92m18md, and off Losartan... prev Norvasc discontinued & hx ACE cough in the past on Lisinopril. ~  NuclearStressTest 1/05 was neg- no ischemia or infarct (+diaphrag attenuation), EF=56%... ~  repeat Nuclear study 6/09 was neg- no ischemia, mild inferoapic thinning, not gated due to PVCs... ~  2DEcho 6/09 showed mild dilated LV w/ EF= 45-50% but no regional wall motion abn, mild AoV calcif & AI, LA mild dil... ~  12/10:  changed from Atacand to LOSACraigsave $$ ~  2/11:  in hosp- Cath= mild nonobstructive 3 vessel CAD, mod LVD w/ EF=40-45%; TEE= CHF w/ EF~45% w/ HK & left atrial appendage clot & AFib; he was diuresed & Norvasc stopped, placed on Coumadin w/ careful f/u by DrNiCherly Hensenhe CC... ~  [facility]/ [facility]... ~  Recurrent AFib w/ RVR after his Thoracoabdominal stent graft 4/13... ~  6/13:  BP was low assoc w/ GU bleeding & ATENOLOL25 & LOSARTAN50 placed on HOLD... ~  COUMADIN on HOLD since GU bleeding & acute blood loss anemia; it has not yet been restarted... ~   8/13:  BP= 116/78 on LASIX 40mg71m K10-Bid;  Wt down 14# to 173# ~  10/13:  BP= 114/82 on Lasix40+K1-Bid (he takes Aten25 if BP>110 at home)... ~  12/13: he had Cards f/u DrNishan> note reviewed, no change in meds, he referred him to DrGearhart for f/u stent as [facility] did not [facility]  5/14: HBP, CHF, AFib  w/ hx clot in LA appendage- resolved on Coumadin> on ASA81, Aten25, Lasix40, K10Bid; BP= 114/72 & he denies CP, palpit etc; followed by Cherly Hensen for Cards- his notes are reviewed, stable- no changes made. ~  8/14: he is post op extensive [facility] w/ 5 stents placed by DrFarber... ~  5/15: on ASA81, Coumadin, Aten25 (if BP>110), Losartan100, Lasix40; BP= 120/64 & he denies CP, palpit etc; followed by Cherly Hensen for Cards- his notes are reviewed, stable- BNP 5/15= 184... ~  7/15: on ASA81, Coumadin, Aten25 (if BP>110), Losartan100, Lasix40; BP= 110/60 & he presented w/ incr left leg swelling after fall; Renal function sl worse w/ Cr=2.0 & rec to decr Losartan100=>50  PERIPHERAL VASCULAR DISEASE (ICD-443.9) - back on ASA 64m/d... he is s/p infrarenal AAA repair w/ right common iliac aneurysm repair (via Ao Bi-iliac graft) in 1998 by DrLawson... also has a known desc thor AA measuring ~5+cm and followed by DrGearhart...  ~  seen by DrGearhart 4/10 w/ CT scan showing ~5cm thoracoabdominal aneurysm w/o signif change...  ~  seen by DrGearhart 2/11 in hosp w/ sl incr size of AAA- f/u planned 680mo~  also followed by [facility]- DrCooper. ~  seen by DrGearhardt 10/11 & stable, no change, f/u 1 yr. ~  Seen 12/12 by DrGearhardt & CTscan is stable, continue conservative approach... ~  2/13:  [facility]... ~  4/13:  He had a fenestrated endovasc repair of [facility]abdominal [facility] by lumbar epidural hematomas (on Coumadin) w/ paraplegia requiring readmission for laminectomy & evac of the hematomas;  [facility] and [facility]12 by [facility] w/ altered mental status, pneumonia, AFib w/ rvr, & FTT> Neuro w/u was otherw neg (& he improved w/ supportive care)... ~  2/14:  CT Chest, Abdomen, Pelvis> per DrBrabham - see report (the abdominal component has enlarged)... ~  [facility]=> 5 stents placed by DrFarber 7/14...  VENOUS INSUFFICIENCY, EDEMA >>  ~  He has chronic venous insuffic w/ mild chronic edema maintained on a low sodium diet, elevation, support hose when nec, & LASIX4037m... ~  7/15:  Presented 2wks after fall at home, left leg red/ swollen/ sl tender, no broken skin/ drainage/ not hot etc; VenDopplers showed NEG for DVT & Protime was too thin...  HYPERCHOLESTEROLEMIA (ICD-272.0) - on LIPITOR 70m60m..  ~  FLP [place]7 showed TChol 115, Tg 58, HDL 36, LDL 67 ~  FLP 5/08 showed TChol 118, TG 66, HDL 31, LDL 74 ~  FLP 5/09 showed TChol 126, TG 71, HDL 37, LDL 75 ~  FLP 6/10 showed TChol 116, TG 73, HDL 40, LDL 62 ~  2/11:  FLP not checked during the hospitalization... ~  FLP [place] in hosp showed TChol 94, TG 52, HDL 23, LDL 61 ~  FLP 12/12 on Lip10 showed TChol 107, TG 38, HDL 40, LDL 60 ~  He needs to ret for FASTING blood work...  HYPOTHYROIDISM >> he remains clinically euthyroid... ~  Labs 12/12 showed TSH= 6.10 ~  Labs 6/13 showed TSH= 6.66 ~  Labs 5/14 showed TSH= 6.21... We decided to start SYNTHROID 50mc55m.. ~  Labs 5/15 on Levothy50 showed TSH= 1.92  DIVERTICULOSIS OF COLON (ICD-562.10) & COLONIC POLYPS (  ICD-211.3) - hx polyps in 2003 = tubular adenoma... ~  last colonoscopy 9/06 showed divertics, hems, no polyps.  ISCHEMIC BOWEL >> Tiberius presented to the ER 7/23 - 04/25/12 w/ abd pain- ELap surg revealed ~5 feet of frankly ischemic small bowel, likely due to internal hernia and closed loop obstruction=> Bowel resected & pulses intact  w/ no evidence of embolic or thrombotic pathology;  Viable small bowel anastomosed & wound vac applied; he had a stable post op course...  RENAL INSUFFICIENCY >>  PROSTATE CANCER (ICD-185) - eval by DrWrenn and they decided on XRT by DrKinard- finished 5/09 and he states that DrKinard "released me"... he sees DrWrenn every 6 months & they follow PSA closely (notes reviewed)... ~  8/12: f/u prostate cancer dx in 2009 & treated w/ XRT completed 6/09; slowly rising PSA w/ doubling time 6-32mo min voiding symptoms, he is considering androgen ablation if needed... ~  6/13: He developed hematuria & urinary retention requiring Urology eval, discontinuation of Coumadin, & Foley placement=> subseq removed 8/13 w/ adeq voiding trial... ~  3/14:  He had f/u visit w/ DrWrenn- PSA nadir after XRT was <0.04 and it has climbed to 0.54 (3/14) w/ PSADT of ~117yrthey chose to continue watchful waiting... ~  Labs 5/15 on Lasix40 showed Cr=1.6, BNP=184  DEGENERATIVE JOINT DISEASE (ICD-715.90) - s/p bilat TKR's, [facility]- DrOlin... He has OXY-IR 51m29ms needed for pain...  ACTINIC SKIN DAMAGE (ICD-692.70) - followed by DrHBrunetta Jeanse knows to avoid sun exposure, use sun screen, etc...  ANEMIA >> ~  Labs 6/13 showed Hg= 8.4, Fe= 31 (11%sat); Rec to start FeSO4 Bid... ~  Labs 7/13 in hosp showed Hg= 12.6=>8.2 at disch... ~  Labs 8/13 showed Hg= 9.5, MCV= 94; rec to continue FeSO4 Bid... ~  Labs 10/13 showed Hg= 13.2 & ok to wean Fe to 1/d til gone then stop... ~  Labs 5/14 showed Hg= 14.2 ~  Labs 5/15 showed Hg= 13.2   Past Surgical History  Procedure Laterality Date  . Total knee arthroplasty      bilateral  . Abdominal aortic aneurysm repair  1998  . Appendectomy    . Inguinal hernia repair      left  . Joint replacement    . Laparotomy  04/16/2012    Procedure: EXPLORATORY LAPAROTOMY;  Surgeon: BriMadilyn HookO;  [facility] [place];  Laterality: N/A;  exploratory Laparotomy,Small bowel  ressection abdominal wound vac placement.  . Laparotomy  04/18/2012    Procedure: EXPLORATORY LAPAROTOMY;  Surgeon: ThoJoyice Fasterornett, MD;  [facility] [place];  Laterality: N/A;  exploratory laparotomy with small bowel anastomosis  . Application of wound vac  04/18/2012    Procedure: APPLICATION OF WOUND VAC;  Surgeon: ThoJoyice Fasterornett, MD;  [facility] [place];  Laterality: N/A;  Removal of abdominal wound vac, Application of incisional  wound vac  . Repair of aa  04/14/2013    done at chaGarvincounter Prescriptions as of 04/08/2014  Medication Sig  . acetaminophen (TYLENOL) 500 MG tablet Take 500 mg by mouth every 6 (six) hours as needed.  . aMarland Kitchenpirin 81 MG tablet Take 81 mg by mouth daily.   . aMarland Kitchenenolol (TENORMIN) 25 MG tablet TAKE 1 TABLET BY MOUTH AS NEEDED (IF BLOOD PRESSURE IS OVER 110)  . atorvastatin (LIPITOR) 10 MG tablet Take 1 tablet (10 mg total) by mouth daily.  . Fluticasone-Salmeterol (ADVAIR DISKUS) 250-50  MCG/DOSE AEPB inhale 1 dose by mouth twice a day  . furosemide (LASIX) 40 MG tablet Take 1 tablet (40 mg total) by mouth daily.  [name] guaiFENesin (MUCINEX) 600 MG 12 hr tablet Take 600 mg by mouth 2 (two) times daily. COPD.  [name] levothyroxine (SYNTHROID, LEVOTHROID) 50 MCG tablet Take 1 tablet (50 mcg total) by mouth daily before breakfast.  . losartan (COZAAR) 100 MG tablet Take 1 tablet (100 mg total) by mouth daily.  . Multiple Vitamin (MULTIVITAMIN) tablet Take 1 tablet by mouth daily.    . tamsulosin (FLOMAX) 0.4 MG CAPS capsule Take 1 capsule (0.4 mg total) by mouth at bedtime.  [name] tiotropium (SPIRIVA HANDIHALER) 18 MCG inhalation capsule Place 1 capsule (18 mcg total) into inhaler and inhale daily.  . traMADol (ULTRAM) 50 MG tablet TAKE 1 TABLET BY MOUTH 3 TIMES A DAY AS NEEDED FOR PAIN  . warfarin (COUMADIN) 2.5 MG tablet 1 tablet everyday except 1/2 tablet on Mondays and Fridays. Or as directed by coumadin clinic    No Known  Allergies   Current Medications, Allergies, Past Medical History, Past Surgical History, Family History, and Social History were reviewed in [employer] record.   Review of Systems        See HPI - all other systems neg except as noted...  The patient complains of dyspnea on exertion.  The patient denies anorexia, fever, weight loss, weight gain, vision loss, decreased hearing, hoarseness, chest pain, syncope, peripheral edema, prolonged cough, headaches, hemoptysis, abdominal pain, melena, hematochezia, severe indigestion/heartburn, hematuria, incontinence, muscle weakness, suspicious skin lesions, transient blindness, difficulty walking, depression, unusual weight change, abnormal bleeding, enlarged lymph nodes, and angioedema.     Objective:   Physical Exam    WD, WN, 80 y/o WM in NAD... GENERAL:  Alert & oriented; pleasant & cooperative; pale complexion... HEENT:  [place]/AT, EOM-wnl, PERRLA, EACs-clear, TMs-wnl, NOSE-clear, THROAT-clear & wnl. NECK:  Supple w/ fairROM; no JVD; normal carotid impulses w/o bruits; no thyromegaly or nodules palpated; no lymphadenopathy. CHEST:  decr BS bilat, scat bibasilar rales, w/o wheezing/ rhonchi/ signs of consolidation. HEART:  irregular rhythm, gr1/6 diast murmur in Ao area, without rubs or gallops detected... ABDOMEN:  Soft & nontender; normal bowel sounds; no organomegaly or masses palpated... EXT: s/p bilat TKR's, mod arthritic changes; +venous insuffic, left leg swelling/ red > right leg, no drainage NEURO:  CN's intact;  gait abn; no focal neuro deficits... DERM:  No lesions noted; no rash etc...  RADIOLOGY DATA:  Reviewed in the EPIC EMR & discussed w/ the patient...  LABORATORY DATA:  Reviewed in the EPIC EMR & discussed w/ the patient...   Assessment:      Ven Insuffic & EDEMA>  Prev improved w/ Lasix40/d;  He knows to elim salt, elev legs, wear support hose, etc;  He fell 7/15 w/ bruise reported, then followed by  redness in skin & incr left leg swelling=> VenDoppler is neg for DVT & Protime was too thin (we will hold Coumadin today, send results to [facility] for adjustment in dose, rec elev legs, no salt, continue Lasix40 but cut Losartan to 50... He will need Vasc Surg f/u if swelling persists.   COPD, Hx Pneumonia>  baseline severe COPD/Emphysema w/ Cardiomeg, Aneurysm & bibasilar fibrosis, stable on Advair250, Spiriva, Mucinex...  HBP>  on Losartan100, Lasix40, Aten25 "as needed for BP>110 but says that he "seldom takes this med"; 7/15 Cr incr to 2.0 7 rec to cut the Losartan to 50...  CAD/  CHF/ etc>  Followed by Cherly Hensen & his prev notes are reviewed;  BNP in ER 12/14 was up to 4200 & on Lasix40 it has improved to 184 by 5/15...  AFib>  On above + Coumadin followed in the CC...  Periph Vasc Dis>  S/p AAA repair 1998 by Sheryn Bison; then followed by DrGearhardt for thoraco-abd aneurysm & referred to [facility] for stent graft surg- done 4/13 by DrFarber w/ complications, and 2nd stage of the surgery 7/14 w/ 5 stents placed per DrFarber...  CHOL>  FLP looks good on Lip10...  Subclinical Hypothyroid>  TSH was 6.21 and Synthroid50 started 5/14=> TSH improved to 1.92...  GI> Presbyesoph, Divertics, Hx polpyps>  Stable & followed by DrDBrodie... 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  Prostate Cancer>  Followed by DrWrenn  w/ slowly rising PSA as noted; then developed urinary retention after lumbar surg and hematuria after foley & coumadin; now back to baseline & being followed...  DJD>  Followed by DrOlin, s/p bilat TKRs...  ANEMIA>  He developed acute blood loss anemia w/ post op Hg= 11-12 but dropped to 8.6 w/ hematuria & INR=4.2; Coumadin & ASA placed on HOLD; f/u labs showed Hg=8.4 & Fe=31 & started on FeSO4 Bid;  Then Hg=9.5 & rec to continue Fe Bid;  Cards restarted his Coumadin;  Now Hg= 13.2 & Fe  improved, ok to wean off Fe...  Actinic skin changes>  Aware, he is monitored by Derm...     Plan:     Patient's Medications  New Prescriptions   No medications on file  Previous Medications   ACETAMINOPHEN (TYLENOL) 500 MG TABLET    Take 500 mg by mouth every 6 (six) hours as needed.   ASPIRIN 81 MG TABLET    Take 81 mg by mouth daily.    ATENOLOL (TENORMIN) 25 MG TABLET    TAKE 1 TABLET BY MOUTH AS NEEDED (IF BLOOD PRESSURE IS OVER 110)   ATORVASTATIN (LIPITOR) 10 MG TABLET    Take 1 tablet (10 mg total) by mouth daily.   FLUTICASONE-SALMETEROL (ADVAIR DISKUS) 250-50 MCG/DOSE AEPB    inhale 1 dose by mouth twice a day   FUROSEMIDE (LASIX) 40 MG TABLET    Take 1 tablet (40 mg total) by mouth daily.   GUAIFENESIN (MUCINEX) 600 MG 12 HR TABLET    Take 600 mg by mouth 2 (two) times daily. COPD.   LEVOTHYROXINE (SYNTHROID, LEVOTHROID) 50 MCG TABLET    Take 1 tablet (50 mcg total) by mouth daily before breakfast.   LOSARTAN (COZAAR) 100 MG TABLET    Take 1 tablet (100 mg total) by mouth daily.   MULTIPLE VITAMIN (MULTIVITAMIN) TABLET    Take 1 tablet by mouth daily.     TAMSULOSIN (FLOMAX) 0.4 MG CAPS CAPSULE    Take 1 capsule (0.4 mg total) by mouth at bedtime.   TIOTROPIUM (SPIRIVA HANDIHALER) 18 MCG INHALATION CAPSULE    Place 1 capsule (18 mcg total) into inhaler and inhale daily.   TRAMADOL (ULTRAM) 50 MG TABLET    TAKE 1 TABLET BY MOUTH 3 TIMES A DAY AS NEEDED FOR PAIN   WARFARIN (COUMADIN) 2.5 MG TABLET    1 tablet everyday except 1/2 tablet on Mondays and Fridays. Or as directed by coumadin clinic  Modified Medications   No medications on file  Discontinued Medications   No medications on file

## 2014-04-08 NOTE — Patient Instructions (Signed)
Today we updated your med list in our EPIC system...    Continue your current medications the same...  For your left leg swelling>>    We have ordered some blood work & a Venous Doppler Ultrasound to check for blood clot...    We will contact you w/ the results when available...   In the meanwhile>>    NO SALT...    Keep your legs elevated...    If poss- wear support hose...    Continue your LASIX fluid pill every morning.Marland KitchenMarland Kitchen

## 2014-04-08 NOTE — Progress Notes (Signed)
Bilateral lower extremity venous duplex complete.

## 2014-04-09 ENCOUNTER — Telehealth: Payer: Self-pay | Admitting: Pulmonary Disease

## 2014-04-09 ENCOUNTER — Ambulatory Visit (INDEPENDENT_AMBULATORY_CARE_PROVIDER_SITE_OTHER): Payer: Medicare Other | Admitting: Cardiovascular Disease

## 2014-04-09 DIAGNOSIS — I4891 Unspecified atrial fibrillation: Secondary | ICD-10-CM

## 2014-04-09 NOTE — Telephone Encounter (Signed)
Called spoke with pt son. Calling to make LA aware he received her message regarding pt losartan. FYI

## 2014-04-09 NOTE — Addendum Note (Signed)
Addended by: Elie Confer on: 04/09/2014 09:15 AM   Modules accepted: Orders

## 2014-04-15 ENCOUNTER — Encounter (HOSPITAL_COMMUNITY)
Admission: RE | Admit: 2014-04-15 | Discharge: 2014-04-15 | Disposition: A | Discharge status: Home / Self Care | Payer: Medicare Other | Source: Ambulatory Visit | Attending: Urology | Admitting: Urology

## 2014-04-15 DIAGNOSIS — C61 Malignant neoplasm of prostate: Secondary | ICD-10-CM | POA: Insufficient documentation

## 2014-04-15 DIAGNOSIS — Z96659 Presence of unspecified artificial knee joint: Secondary | ICD-10-CM | POA: Insufficient documentation

## 2014-04-15 DIAGNOSIS — M4120 Other idiopathic scoliosis, site unspecified: Secondary | ICD-10-CM | POA: Diagnosis not present

## 2014-04-15 MED ORDER — TECHNETIUM TC 99M MEDRONATE IV KIT
23.6000 | PACK | Freq: Once | INTRAVENOUS | Status: AC | PRN
  Administered 2014-04-15: 23.6 via INTRAVENOUS

## 2014-04-17 ENCOUNTER — Ambulatory Visit (INDEPENDENT_AMBULATORY_CARE_PROVIDER_SITE_OTHER): Payer: Medicare Other | Admitting: Pharmacist

## 2014-04-17 DIAGNOSIS — I4891 Unspecified atrial fibrillation: Secondary | ICD-10-CM

## 2014-04-17 LAB — POCT INR: INR: 2.7

## 2014-05-01 ENCOUNTER — Ambulatory Visit (INDEPENDENT_AMBULATORY_CARE_PROVIDER_SITE_OTHER): Payer: Medicare Other | Admitting: Pharmacist

## 2014-05-01 DIAGNOSIS — I4891 Unspecified atrial fibrillation: Secondary | ICD-10-CM

## 2014-05-01 LAB — POCT INR: INR: 2.7

## 2014-05-29 ENCOUNTER — Ambulatory Visit (INDEPENDENT_AMBULATORY_CARE_PROVIDER_SITE_OTHER): Payer: Medicare Other | Admitting: Pharmacist

## 2014-05-29 DIAGNOSIS — I4891 Unspecified atrial fibrillation: Secondary | ICD-10-CM

## 2014-05-29 LAB — POCT INR: INR: 2.4

## 2014-07-02 ENCOUNTER — Ambulatory Visit (INDEPENDENT_AMBULATORY_CARE_PROVIDER_SITE_OTHER): Payer: Medicare Other | Admitting: Pharmacist

## 2014-07-02 DIAGNOSIS — I4891 Unspecified atrial fibrillation: Secondary | ICD-10-CM

## 2014-07-02 LAB — POCT INR: INR: 2.5

## 2014-07-22 ENCOUNTER — Other Ambulatory Visit: Payer: Self-pay | Admitting: Cardiovascular Disease

## 2014-08-07 ENCOUNTER — Ambulatory Visit (INDEPENDENT_AMBULATORY_CARE_PROVIDER_SITE_OTHER): Payer: Medicare Other | Admitting: *Deleted

## 2014-08-07 DIAGNOSIS — I4891 Unspecified atrial fibrillation: Secondary | ICD-10-CM

## 2014-08-07 LAB — POCT INR: INR: 2.9

## 2014-08-10 ENCOUNTER — Ambulatory Visit: Payer: Medicare Other | Admitting: Pulmonary Disease

## 2014-08-13 ENCOUNTER — Ambulatory Visit: Payer: Medicare Other | Admitting: Pulmonary Disease

## 2014-08-14 ENCOUNTER — Ambulatory Visit (INDEPENDENT_AMBULATORY_CARE_PROVIDER_SITE_OTHER): Payer: Medicare Other | Admitting: Pulmonary Disease

## 2014-08-14 ENCOUNTER — Encounter: Payer: Self-pay | Admitting: Pulmonary Disease

## 2014-08-14 VITALS — BP 130/72 | HR 72 | Temp 97.5°F | Ht 69.0 in | Wt 193.0 lb

## 2014-08-14 DIAGNOSIS — J4489 Other specified chronic obstructive pulmonary disease: Secondary | ICD-10-CM

## 2014-08-14 DIAGNOSIS — M545 Low back pain, unspecified: Secondary | ICD-10-CM

## 2014-08-14 DIAGNOSIS — E7800 Pure hypercholesterolemia, unspecified: Secondary | ICD-10-CM

## 2014-08-14 DIAGNOSIS — I10 Essential (primary) hypertension: Secondary | ICD-10-CM

## 2014-08-14 DIAGNOSIS — M159 Polyosteoarthritis, unspecified: Secondary | ICD-10-CM

## 2014-08-14 DIAGNOSIS — I712 Thoracic aortic aneurysm, without rupture, unspecified: Secondary | ICD-10-CM

## 2014-08-14 DIAGNOSIS — C61 Malignant neoplasm of prostate: Secondary | ICD-10-CM

## 2014-08-14 DIAGNOSIS — J449 Chronic obstructive pulmonary disease, unspecified: Secondary | ICD-10-CM

## 2014-08-14 DIAGNOSIS — I481 Persistent atrial fibrillation: Secondary | ICD-10-CM

## 2014-08-14 DIAGNOSIS — M150 Primary generalized (osteo)arthritis: Secondary | ICD-10-CM

## 2014-08-14 DIAGNOSIS — E039 Hypothyroidism, unspecified: Secondary | ICD-10-CM

## 2014-08-14 DIAGNOSIS — N289 Disorder of kidney and ureter, unspecified: Secondary | ICD-10-CM

## 2014-08-14 DIAGNOSIS — I5022 Chronic systolic (congestive) heart failure: Secondary | ICD-10-CM

## 2014-08-14 DIAGNOSIS — I4819 Other persistent atrial fibrillation: Secondary | ICD-10-CM

## 2014-08-14 DIAGNOSIS — I739 Peripheral vascular disease, unspecified: Secondary | ICD-10-CM

## 2014-08-14 NOTE — Patient Instructions (Signed)
Today we updated your med list in our EPIC system...    Continue your current medications the same...  Watch your intake & stay away from CARBS/ sweets...  Today we gave you the 2015 flu vaccine...  Call for any questions...  Let's plan a follow up visit in 66months w/ FASTING blood work at that time.Marland KitchenMarland Kitchen

## 2014-08-15 ENCOUNTER — Encounter: Payer: Self-pay | Admitting: Pulmonary Disease

## 2014-08-15 NOTE — Progress Notes (Signed)
Subjective:     Patient ID: Ryan Hess, male   DOB: Aug 25, 1924, 80 y.o.   MRN: [id number]  HPI 80 y/o WM here for a follow up visit... he has multiple medical problems as noted below...   ~  SEE PREV EPIC NOTES FOR OLDER DATA >>   ~  July 08, 2012:  6wk ROV & Ryan Hess is stable, slowly gaining strength, wt up 3# to 176# today, BP well regulated on his Lasix40 & takes the Aten25 only if BP>110;  Breathing stable on Advair250 + Spiriva & he uses the Mucinex as needed;  Due for f/u CBC & Fe on his FeSO4 384mBid...    We reviewed prob list, meds, xrays and labs> see below for updates >> OK Flu vaccine today...  LABS 10/13:  Chems- wnl;  CBC- improved w/ Hg=13.2 & Fe looks good etc...  ~  October 07, 2012:  352moOV & Ryan Hess much better, stable, just c/o some back discomfort;  We reviewed the following medical problems during today's office visit >>     COPD, HxPneumonia> on Advair250, Spiriva, Mucinex; last CT 12/12 w/ severe emphysema & scarring, plus his extensive atherosclerotic dis/ ectatic thorAo/ aneurysm/ etc; his breathing is stable- mild cough, min sput, no hemoptysis, stable DOE, etc...     HBP, CHF, AFib w/ hx clot in LA appendage- resolved on Coumadin> on ASA81, Aten25, Lasix40, K10Bid; followed by Ryan Fasoor Cards- seen 12/13 & his note is reviewed, no changes made...    Peripheral Vasc Dis> on ASA81 & Coumadin; followed by Ryan Hess [facility]urg  [facility]see extensive hx below)...    Chol> on Lip10; last FLP 12/12 showed TChol 107, TG 38, HDL 40, LDL 60    GI- Divertics, Polyps, Hx ischemic bowel> the latter was due to an internal hernia & closed loop obstruction & not from any thrombotic or embolic phenomenon; he has done well since the surgery...    GU- prostate cancer> followed by Ryan Hess & he elected XRT- admin by Ryan Hess in 2009; he has a slowly rising PSa w/ a doubling time of 6-70m6moe is considerinjg androgen ablation if necessary...    DJD> on  Oxy-IR 5mg83mn; he is s/p bilat TKRs by Ryan Hess    Hx Anemia> on Fe &VI- Hg increased from 9.5 post op to 13.2 w/ Fe=70 (21% sat) in Oct2013... We reviewed prob list, meds, xrays and labs> see below for updates >>   CXR 1/14 showed cardiomegaly, aortic stent stable, clear lungs w/ basilar scarring, NAD...   ~  Feb 04, 2013:  43mo 570mo& GeorgRaymontwaiting a [facility] the next stage in his planned thoracoabd aneurysm repair- he had his situation reviewed by Ryan Hess here & [facility] since this needs to be done endovascularly & is quite complicated... We reviewed the following medical problems during today's office visit >>     COPD, HxPneumonia> on Advair250, Spiriva, Mucinex; last CT 12/12 w/ severe emphysema & scarring, plus his extensive atherosclerotic dis/ ectatic thorAo/ aneurysm/ etc; his breathing is stable- mild cough, min sput, no hemoptysis, stable DOE, etc...     HBP, CHF, AFib w/ hx clot in LA appendage- resolved on Coumadin> on ASA81, Aten25, Lasix40, K10Bid; BP= 114/72 & he denies CP, palpit etc; followed by Ryan HensenCards- his notes are reviewed, stable- no changes made...    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 DrFarbers note indicates that to finish the repair he will require 3 branches and a left renal fenestration, they quoted him a risk of paraplegia in the 10-15% range & he will need preadmission for a lumbar drain to be placed, etc; he is awaiting the call from Ryan Hess...    Chol> on Lip10; last FLP 12/12 showed TChol 107, TG 38, HDL 40, LDL 60    Hypothyroid> we have been following  his TSH levels and he has been consistently 6-7 range; Labs 5/14 showed TSH=6.21 & we decided to start SYNTHROID30mg/d     GI- Divertics, Polyps, Hx ischemic bowel> the latter was due to an internal hernia & closed loop obstruction & not from any thrombotic or embolic phenomenon; he has done well since the surgery 7/13...    GU- BPH, hx prostate cancer, Renal insuffic> on Flomax0.4; followed by Ryan Hess & he elected XRT- admin by Ryan Hess in 2009; he has a slowly rising PSA w/ a doubling time of 6-970mohe is considerinjg androgen ablation if necessary, Cr=1.5...Marland KitchenMarland Kitchen  DJD> on Oxy-IR 40m58mrn; he is s/p bilat TKRs by Ryan Hess...    Hx Anemia> prev on Fe; Hg increased from 9.5 post op to 13.2 w/ Fe=70 (21% sat) in Oct2013... We reviewed prob list, meds, xrays and labs> see below for updates >>   LABS 5/14:  Chems- ok w/ stable mild RI (Cr=1.5);  CBC- wnl w/ Hg=14.2;  TSH=6.21 (Synthroid50 started);  BNP=263;  Protimes adjusted by the CC...   ~  May 19, 2013:  5mo98mo & Ryan Hess [facility] in a >4h operation w/ special spinal precautions he says; he developed a blood clot in his left arm w/ some persist swelling; [facility] labs done last [facility] (we do not yet have surgical or post op notes from them in [facility] reports that everything was OK but Ryan Hess wants to see better flow throught the areas of concern;  He has lost 7# down to 174# today;  BP looks good at 110/64 and O2 sats are 97% on RA at rest;  His CC is pain, esp back pain & he does not want to take narcotics- out of Percocet & he prefers Tramadol50 up to Tid & a musc relaxer- best covered is Tizanidine4mg 21m as needed...  ~  August 19, 2013:  5mo R240mo Ryan Hess some LBP after trying to lift wife several weeks ago; he has Tramadol Tid & using heat, we will refill Tizanidine and rec f/u w/ Ortho if not improving for poss shot... Otherw  stable, doing satis overall...     Breathing is stable on Advair, spiriva, & Mucinex...    BP & Cardiovasc stable on Aten, Lasix, Coumadin...     Lipids stable on Lip10...     Thyroid stable on Synthroid50=> due for f/u TSH but wants to wait til next OV for FASTING blood work...    GU, BPH are stable on flomax0.4mg/d.17m We reviewed prob list, meds, xrays and labs> see below for  updates >> he had the 2014 Flu vaccine 10/14...   CXR 10/14 showed chr changes of Ao stent graft, stable cardiomegaly, no infiltrates, NAD...   LABS 10/14:  CBC- wnl;  Protimes followed in CC...    ~  September 11, 2013:  3wk ROV & Ryan Hess is added-on at son's request for 2 issues>>    1st> son called for refill of Losartan which we did not have on his list; son notes that after his last surg at [facility] 7/14 that his BP has been higher, they did not disch him on any BP meds, so about 46moago he started his dad on an old prescription for Losartan100; when he was here 11/25 & his BP read 118/62 he was taking the Losartan100 & Lasix40 (he has Aten25 but "very seldom" takes that & it is written for 1/d prn BP>110);  Today is BP= 120/60 on the Lasix40 & Losartan100- asked to continue the same 7 Rx written for these...    2nd> he had sudden pleuritic right CP 12/14 & went to the ER- described as sharp, worse w/ DB but also worse w/ palp on right lower chest wall; no SOB, no sput, ?min streaky hemoptysis; ER eval showed norm resp effort, tender chest wall, no rubs; CXR w/o active dis and CTA showed no evid PE (see full reports for all the chronic changes); Labs were ok x BNP=4200; he reports prompt resolution of the CP w/ MS x2 and Toradol x1; the pain has not returned, and as a fringe benefit- the Vicodin he was given via the Er has really helped his daily LBP (his chief complaint...    We reviewed prob list, meds, xrays and labs> see below for updates >> we refilled Hydrocodone5-325, Losartan100, Lasix40, Flomax0.4..Marland KitchenMarland KitchenHe has planned  cardiac f/u soon to address the BNP of 4200 on Lasix40 w/ BUN=26 Cr=1.3  ~  Feb 09, 2014:  525moOV & GeAbdurrahmanas been quite stable, breathing has been good but they are concerned regarding report of donut hole med cost of $800/mo or more... [facility] & was told that everything looks great... We reviewed the following medical problems during today's office visit >>     COPD, HxPneumonia> on Advair250, Spiriva, Mucinex; last CT 12/12 w/ severe emphysema & scarring, plus his extensive atherosclerotic dis/ ectatic thorAo/ aneurysm/ etc; his breathing is stable- mild cough, min sput, no hemoptysis, stable DOE, etc...     HBP, CHF, AFib w/ hx clot in LA appendage- resolved on Coumadin> on ASA81, Coumadin, Aten25 (if BP>110), Losartan100, Lasix40; BP= 120/64 & he denies CP, palpit etc; followed by Ryan Hensenor Cards- his notes are reviewed, stable- no changes made...    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 DrFarbers note indicates that to finish the repair he will require 3 branches and a left renal fenestration, they quoted him a risk of paraplegia in the 10-15% range; [facility] stents placed 04/15/13 in a >4h operation w/ special spinal precautions- he developed a blood clot in his left arm w/ some persist swelling; he has  reg f/[facility], seen 5/15 & doing well; BNP=184...     Chol> on Lip10; last FLP 12/12 showed TChol 107, TG 38, HDL 40, LDL 60; we discussed return for f/u FLP...    Hypothyroid> we have been following his TSH levels and he has been consistently 6-7 range; we decided to start  SYNTHROID1mg/d in 2014 and f/u TSH 5/15 on Levothy50 = 1.92    GI- Divertics, Polyps, Hx ischemic bowel> the latter was due to an internal hernia & closed loop obstruction & not from any thrombotic or embolic phenomenon; he has done well since the surgery 7/13...    GU- BPH, hx prostate cancer, Renal insuffic> on Flomax0.4; followed by Ryan Hess & he elected XRT- admin by Ryan Hess in 2009; he has a slowly rising PSA w/ a doubling time of 6-966mohe is considerinjg androgen ablation if necessary, Cr=1.6...Marland KitchenMarland Kitchen  DJD> on Oxy-IR 60m38mid from Pain management; he is s/p bilat TKRs by Ryan Hess...    Hx Anemia> prev on Fe; Hg increased from 9.5 post op to 13.2 w/ Fe=70 (21% sat) in Oct2013... f/u labs 5/15 showed Hg=13.2 We reviewed prob list, meds, xrays and labs> see below for updates >>   LABS 5/15:  Chems- ok x BUN=26 Cr=1.6;  CBC- wnl w/ Hg=13.2;  TSH=1.92 on Levothy50;  BNP=184...   ~  April 08, 2014:  22mo47mo & add-on appt requested for swelling in left leg> pt reports that he fell going to the bathroom ~2wks ago w/ bruise on left brow, left hand & left leg; the bruising has since resolved but son noticed that left leg is red in the skin & leg is swollen to twice the size of the right leg, sl tender, not warm/hot, no open areas or drainage;  He has chronic venous insuffic & some mild edema but the redness is new & the L>R leg swelling is new;  He is on chr coumadin rx due to his AFib & [facility] 2.60[facility] 2ms- taking 1x5d=TWThSS and 1/2 x2d=MF; last Protime was 03/06/14 w/ INR=2.6, kept the same & asked to f/u 04/17/14... We reviewed prob list, meds, xrays and labs> see below for updates >>   LABS 7/15:  Chems- ok x BUN=33 Cr=2.0;  CBC- ok w/ Hg=12.2 WBC=6.9;  Protime=49sec, INR=4.8;  Sed=37  D-Dimer=2.50  CRP=4.4  VenDopplers 7/15 showed NEG for DVT... REC> NO COUMADIN TODAY, we will call result to the CC for med adjustment & f/u; Rec incr water, no salt, elev legs & continue  Lasix20; if swelling persists he will [facility]~  August 14, 2014:  22mo R49mo Ryan Hess reports stable, no new complaints over the interval, but his son reports more forgetful- they are not yet ready to consider [name] rx... Last OV 7/15 he had left leg swelling, VenDopplers were neg for DVT & he was already on Coumadin due to his AFib; he has elim sodium, elev legs, wearing support hose & takingthe Lasix20- in the interval his swelling is gone & he is back to baseline (this is despite a 12# wt gain , eating better & he looks better... We reviewed the following medical problems during today's office visit >>     COPD, HxPneumonia> on Advair250, Spiriva, Mucinex; last CT 12/12 w/ severe emphysema & scarring, plus his extensive atherosclerotic dis/ ectatic thorAo/ aneurysm/ etc; his breathing is stable- mild cough, min sput, no hemoptysis, stable DOE, etc...[name]  HBP, CHF, AFib w/ hx clot in LA appendage- resolved on Coumadin> on ASA81, Coumadin, Losartan50, Lasix40; BP= 130/72 & he denies CP, palpit etc; followed by Cherly Hess for Cards- last seen 12/14, stable- no changes made...    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 DrFarbers note indicates that to finish the repair he will require 3 branches and a left renal fenestration, they quoted him a risk of paraplegia in the 10-15% range; s/p surg at [facility] by Ryan Hess w/ 5 stents placed 04/15/13 in a >4h operation w/ special spinal precautions- he developed a blood clot in his left arm w/ some persist swelling; he has reg f/u at [facility], seen 5/15 & doing well; BNP=184...     Chol> on Lip10; last FLP  12/12 showed TChol 107, TG 38, HDL 40, LDL 60; we discussed return for f/u FLP![name]..    Hypothyroid> we have been following his TSH levels and he has been consistently 6-7 range; we decided to start SYNTHROID66mg/d in 2014 and f/u TSH 5/15 on Levothy50 = 1.92    GI- Divertics, Polyps, Hx ischemic bowel> the latter was due to an internal hernia & closed loop obstruction & not from any thrombotic or embolic phenomenon; he has done well since the surgery 7/13...    GU- BPH, hx prostate cancer, Renal insuffic> on Flomax0.4; followed by Ryan Hess & he elected XRT- admin by Ryan Hess in 2009; he had a slowly rising PSA w/ a doubling time of ~132yrhe is considering restaging & androgen ablation if necessary, Cr=1.6-2.0 range...    DJD> he is s/p bilat TKRs by Ryan Hess; on Tramadol50 & Tylenol as needed...    Hx Anemia> prev on Fe; Hg increased from 9.5 post op to 13.2 w/ Fe=70 (21% sat) in Oct2013... f/u labs 5/15 showed Hg=13.2 We reviewed prob list, meds, xrays and labs> see below for updates >> OK 2015 Flu vaccine today...           Problem List:  COPD (ICD-496) Hx of PNEUMONIA, ORGANISM UNSPECIFIED (ICD-486) PULMONARY NODULE & CAVITY RLL (ICD-518.89) - he is an ex-smoker, having quit [place] smoking... he was exercising regularly and walking daily ~66m73m-6 days per week... on ADVEnterpriseily, +MUCINEX 2Bid w/ Fluids... ~  baseline CXR & CTChest w/ marked emphysema, atheromatous calcif, 5cm desc thorAA...  ~  CXR & CTA 2/11 in hosp showed cardiomeg + coronary calcif, advanced atherosclerosis of Ao w/ aneuryms 5.8cm into abd, underlying emphysema w/ interst edema & bilat effusions, no PE... ~  4/11:  RLL pneumonia which was slow to clear... ~  6/11:  f/u CXR & CT Chest 6/11 w/ 6cm cavitary area RLL & nodular component inferiorly, +hilar adenopathy, no mediastinal nodes, etc; Tumor markers showed CEA=7.5 & Ca19-9= 10.8; > IR felt lesion was too hi risk for bx, therefore contin Rx  & observe. ~  8/11:  f/u CXR w/o change in RLL opac (no worsening)>> repeat CEA=6.5; plan f/u CT Chest in 92mo65mo 10/11: f/u CT Chest showed interval decrease in size of RLL cavitary lesion & the inferiorly placed soft tissue component; otherw there is severe  emphysema, cardiomeg, coronary calcif, thoracoabd aneurysm w/o change; there was an air-fluid level in a dilated esoph> check Ba Esophagram= Nonspecific esophageal motility disorder with very poor primary esophageal contractions (no HH, stricture, etc)... ~  12/12: f/u CTChest showed severe emphysema & scarring w/ bibasilar atx (no infiltrates no cavities), markedly tortuous & ectaticThorAo w/ extensive atherosclerotic changes & no change in decr thor aneurysm measuring 5.9 x 5.6cm, mild cardiomeg & dense coronary calcif as well... ~  4/13:  He had a fenestrated endovasc repair of Thoraco-abdominal AA by Ryan Hess at [facility]... ~  subseq CXRs thru 7/13 hosp w/ cardiomeg, low lung vols, bibasilar airsp dis, etc... ~  CXR 1/14 showed cardiomeg, bibasilar fibrosis, thoracic aortic stent noted, NAD.[name]. ~  7/14:  ?RUL pneumonia treated w/ Avelox & resolved=> pt had his [facility] ~  CXR 10/14 showed chr changes of Ao stent graft, stable cardiomegaly, no infiltrates, NAD.[name]. ~  5/15: on Advair250, Spiriva, Mucinex; last CT 12/12 w/ severe emphysema & scarring, plus his extensive atherosclerotic dis/ ectatic thorAo/ aneurysm/ etc; his breathing is stable- mild cough, min sput, no hemoptysis, stable DOE, etc. ~  11/15: stable on same meds...   HYPERTENSION (ICD-401.9)                         << followed by Cherly Hess for Cards >> CONGESTIVE HEART FAILURE (ICD-428.0) ATRIAL FIBRILLATION (ICD-427.31) w/ clot in left atrial appendage >> resolved on f/u TEE, on COUMADIN per Cards & cardioverted. PREMATURE VENTRICULAR CONTRACTIONS, FREQUENT (ICD-427.69)  MEDS> prev on ATENOLOL 61m/d, & LASIX 238m2AM + KCl 1049mid, and off Losartan... prev  Norvasc discontinued & hx ACE cough in the past on Lisinopril. ~  NuclearStressTest 1/05 was neg- no ischemia or infarct (+diaphrag attenuation), EF=56%... ~  repeat Nuclear study 6/09 was neg- no ischemia, mild inferoapic thinning, not gated due to PVCs... ~  2DEcho 6/09 showed mild dilated LV w/ EF= 45-50% but no regional wall motion abn, mild AoV calcif & AI, LA mild dil... ~  12/10:  changed from [place] to [place] save $$ ~  2/11:  in hosp- Cath= mild nonobstructive 3 vessel CAD, mod LVD w/ EF=40-45%; TEE= CHF w/ EF~45% w/ HK & left atrial appendage clot & AFib; he was diuresed & Norvasc stopped, placed on Coumadin w/ careful f/u by DrNCherly Hensenthe CC... ~  [facility]/ PVCs... ~  Recurrent AFib w/ RVR after his Thoracoabdominal stent graft 4/13... ~  6/13:  BP was low assoc w/ GU bleeding & ATENOLOL25 & LOSARTAN50 placed on HOLD... ~  COUMADIN on HOLD since GU bleeding & acute blood loss anemia; it has not yet been restarted... ~  8/13:  BP= 116/78 on LASIX 74m2m& K10-Bid;  Wt down 14# to 173# ~  10/13:  BP= 114/82 on Lasix40+K1-Bid (he takes Aten25 if BP>110 at home)... ~  12/13: he had Cards f/u DrNishan> note reviewed, no change in meds, he referred him to DrGearhart for f/u stent as he did not [facility] to [facility]~  5/14: HBP, CHF, AFib w/ hx clot in LA appendage- resolved on Coumadin> on ASA81, Aten25, Lasix40, K10Bid; BP= 114/72 & he denies CP, palpit etc; followed by DrNiCherly Hess Cards- his notes are reviewed, stable- no changes made. ~  8/14: he is post op [facility] by Ryan Hess... ~  5/15: on ASA81, Coumadin, Aten25 (if BP>110),  Losartan100, Lasix40; BP= 120/64 & he denies CP, palpit etc; followed by Cherly Hess for Cards- his notes are reviewed, stable- BNP 5/15= 184... ~  7/15: on ASA81, Coumadin, Aten25 (if BP>110), Losartan100, Lasix40; BP= 110/60 & he presented w/ incr left leg swelling after fall; Renal function sl  worse w/ Cr=2.0 & rec to decr Losartan100=>50 ~  11/15: on ASA81, Coumadin, Losar50, Lasix40; BP= 130/72 & he denies CP, palpit etc; followed by Cherly Hess for Cards- last seen 12/14, stable- no changes made  PERIPHERAL VASCULAR DISEASE (ICD-443.9) - back on ASA 82m/d... he is s/p infrarenal AAA repair w/ right common iliac aneurysm repair (via Ao Bi-iliac graft) in 1998 by DrLawson... also has a known desc thor AA measuring ~5+cm and followed by DrGearhart...  ~  seen by DrGearhart 4/10 w/ CT scan showing ~5cm thoracoabdominal aneurysm w/o signif change...  ~  seen by DrGearhart 2/11 in hosp w/ sl incr size of AAA- f/u planned 640mo~  also followed by PV- DrCooper. ~  seen by Ryan Hess 10/11 & stable, no change, f/u 1 yr. ~  Seen 12/12 by Ryan Hess & CTscan is stable, continue conservative approach... ~  2/13:  [facility]... ~  4/13:  He had a fenestrated endovasc [facility] by lumbar epidural hematomas (on Coumadin) w/ paraplegia requiring readmission for laminectomy & evac of the hematomas;  [facility] by [facility] w/ altered mental status, pneumonia, AFib w/ rvr, & FTT> Neuro w/u was otherw neg (& he improved w/ supportive care)... ~  2/14:  CT Chest, Abdomen, Pelvis> per Ryan Hess - see report (the abdominal component has enlarged)... ~  [facility]neurys[facility]=> 5 stents placed by Ryan Hess 7/14, stable...  VENOUS INSUFFICIENCY, EDEMA >>  ~  He has chronic venous insuffic w/ mild chronic edema maintained on a low sodium diet, elevation, support hose when nec, & [id number]... ~  7/15:  Presented 2wks after fall at home, left leg red/ swollen/ sl tender, no broken skin/ drainage/ not hot etc; VenDopplers showed NEG for DVT  & Protime was too thin... ~  11/15: edema resolved and back to baseline...  HYPERCHOLESTEROLEMIA (ICD-272.0) - on LIPITOR 8m44m..  ~  FLP [place] showed TChol 115, Tg 58, HDL 36, LDL 67 ~  FLP 5/08 showed TChol 118, TG 66, HDL 31, LDL 74 ~  FLP 5/09 showed TChol 126, TG 71, HDL 37, LDL 75 ~  FLP 6/10 showed TChol 116, TG 73, HDL 40, LDL 62 ~  2/11:  FLP not checked during the hospitalization... ~  FLP [place] in hosp showed TChol 94, TG 52, HDL 23, LDL 61 ~  FLP 12/12 on Lip10 showed TChol 107, TG 38, HDL 40, LDL 60 ~  He needs to ret for FASTING blood work...  HYPOTHYROIDISM >> he remains clinically euthyroid... ~  Labs 12/12 showed TSH= 6.10 ~  Labs 6/13 showed TSH= 6.66 ~  Labs 5/14 showed TSH= 6.21... We decided to start SYNTHROID 50mc7m.. ~  Labs 5/15 on Levothy50 showed TSH= 1.92  DIVERTICULOSIS OF COLON (ICD-562.10) & COLONIC POLYPS (ICD-211.3) - hx polyps in 2003 = tubular adenoma... ~  last colonoscopy 9/06 showed divertics, hems, no polyps.  ISCHEMIC BOWEL >>  Ryan Hess presented to the ER 7/23 - 04/25/12 w/ abd pain- ELap surg revealed ~5 feet of frankly ischemic small bowel, likely due to internal hernia and closed loop obstruction=> Bowel resected & pulses intact w/ no evidence of embolic or thrombotic pathology;  Viable small bowel anastomosed & wound vac applied; he had a stable post op course...  RENAL INSUFFICIENCY >>  PROSTATE CANCER (ICD-185) - eval by Ryan Hess and they decided on XRT by Ryan Hess- finished 5/09 and he states that Ryan Hess "released me"... he sees Ryan Hess every 6 months & they follow PSA closely (notes reviewed)... ~  8/12: f/u prostate cancer dx in 2009 & treated w/ XRT completed 6/09; slowly rising PSA w/ doubling time 6-28mo min voiding symptoms, he is considering androgen ablation if needed... ~  6/13: He developed hematuria & urinary retention requiring Urology eval, discontinuation of Coumadin, & Foley placement=> subseq removed 8/13 w/ adeq voiding  trial... ~  3/14:  He had f/u visit w/ Ryan Hess- PSA nadir after XRT was <0.04 and it has climbed to 0.54 (3/14) w/ PSADT of ~16yrthey chose to continue watchful waiting... ~  1/15:  Ryan Hess reports that PSA is up to 1.46 ~  Labs 5/15 on Lasix40 showed Cr=1.6, BNP=184 ~  7/15: he had f/u w/ Ryan Hess> BPH w/ BOO, Prostate ca; on Flomax0.4, PSA up to 1.84, neg bone scan x degen changes; they continue to follow w/ watchful waiting, ROV in 68m30mo  DEGENERATIVE JOINT DISEASE (ICD-715.90) - s/p bilat TKR's, [facility]- [facility]... He has OXY-IR 5mg32m needed for pain...  ACTINIC SKIN DAMAGE (ICD-692.70) - followed by DrHoBrunetta Jeans knows to avoid sun exposure, use sun screen, etc...  ANEMIA >> ~  Labs 6/13 showed Hg= 8.4, Fe= 31 (11%sat); Rec to start FeSO4 Bid... ~  Labs 7/13 in hosp showed Hg= 12.6=>8.2 at disch... ~  Labs 8/13 showed Hg= 9.5, MCV= 94; rec to continue FeSO4 Bid... ~  Labs 10/13 showed Hg= 13.2 & ok to wean Fe to 1/d til gone then stop... ~  Labs 5/14 showed Hg= 14.2 ~  Labs 5/15 showed Hg= 13.2   Past Surgical History  Procedure Laterality Date  . Total knee arthroplasty      bilateral  . Abdominal aortic aneurysm repair  1998  . Appendectomy    . Inguinal hernia repair      left  . Joint replacement    . Laparotomy  04/16/2012    Procedure: EXPLORATORY LAPAROTOMY;  Surgeon: BriaMadilyn Hook;  [facility] [place];  Laterality: N/A;  exploratory Laparotomy,Small bowel ressection abdominal wound vac placement.  . Laparotomy  04/18/2012    Procedure: EXPLORATORY LAPAROTOMY;  Surgeon: ThomJoyice Fasterrnett, MD;  [facility];  Laterality: N/A;  exploratory laparotomy with small bowel anastomosis  . Application of wound vac  04/18/2012    Procedure: APPLICATION OF WOUND VAC;  Surgeon: ThomJoyice Fasterrnett, MD;  [facility];  Laterality: N/A;  Removal of abdominal wound vac, Application of incisional  wound vac  . Repair of aa   04/14/2013    [facility] as of 08/14/2014  Medication Sig  . aspirin 81 MG tablet Take 81 mg by mouth daily.   . atMarland Kitchenrvastatin (LIPITOR) 10 MG tablet Take 1 tablet (10 mg total) by mouth daily.  . Fluticasone-Salmeterol (ADVAIR DISKUS) 250-50 MCG/DOSE AEPB inhale 1 dose by mouth twice a day  . furosemide (LASIX)  40 MG tablet Take 1 tablet (40 mg total) by mouth daily.  [name] guaiFENesin (MUCINEX) 600 MG 12 hr tablet Take 600 mg by mouth 2 (two) times daily. COPD.  [name] ibuprofen (ADVIL,MOTRIN) 200 MG tablet Take 200 mg by mouth every 6 (six) hours as needed.  [name] levothyroxine (SYNTHROID, LEVOTHROID) 50 MCG tablet Take 1 tablet (50 mcg total) by mouth daily before breakfast.  . losartan (COZAAR) 50 MG tablet Take 50 mg by mouth daily.  . Multiple Vitamin (MULTIVITAMIN) tablet Take 1 tablet by mouth daily.    . tamsulosin (FLOMAX) 0.4 MG CAPS capsule Take 1 capsule (0.4 mg total) by mouth at bedtime.  [name] tiotropium (SPIRIVA HANDIHALER) 18 MCG inhalation capsule Place 1 capsule (18 mcg total) into inhaler and inhale daily.  . traMADol (ULTRAM) 50 MG tablet TAKE 1 TABLET BY MOUTH 3 TIMES A DAY AS NEEDED FOR PAIN  . warfarin (COUMADIN) 2.5 MG tablet Take as directed by anticoagulation clinic  . [DISCONTINUED] acetaminophen (TYLENOL) 500 MG tablet Take 500 mg by mouth every 6 (six) hours as needed.  . [DISCONTINUED] atenolol (TENORMIN) 25 MG tablet TAKE 1 TABLET BY MOUTH AS NEEDED (IF BLOOD PRESSURE IS OVER 110)    No Known Allergies   Current Medications, Allergies, Past Medical History, Past Surgical History, Family History, and Social History were reviewed in [employer] record.   Review of Systems        See HPI - all other systems neg except as noted...  The patient complains of dyspnea on exertion.  The patient denies anorexia, fever, weight loss, weight gain, vision loss, decreased hearing, hoarseness, chest pain, syncope,  peripheral edema, prolonged cough, headaches, hemoptysis, abdominal pain, melena, hematochezia, severe indigestion/heartburn, hematuria, incontinence, muscle weakness, suspicious skin lesions, transient blindness, difficulty walking, depression, unusual weight change, abnormal bleeding, enlarged lymph nodes, and angioedema.     Objective:   Physical Exam    WD, WN, 80 y/o WM in NAD... GENERAL:  Alert & oriented; pleasant & cooperative; pale complexion... HEENT:  [place]/AT, EOM-wnl, PERRLA, EACs-clear, TMs-wnl, NOSE-clear, THROAT-clear & wnl. NECK:  Supple w/ fairROM; no JVD; normal carotid impulses w/o bruits; no thyromegaly or nodules palpated; no lymphadenopathy. CHEST:  decr BS bilat, scat bibasilar rales, w/o wheezing/ rhonchi/ signs of consolidation. HEART:  irregular rhythm, gr1/6 diast murmur in Ao area, without rubs or gallops detected... ABDOMEN:  Soft & nontender; normal bowel sounds; no organomegaly or masses palpated... EXT: s/p bilat TKR's, mod arthritic changes; +venous insuffic, left leg swelling/ red > right leg, no drainage NEURO:  CN's intact;  gait abn; no focal neuro deficits... DERM:  No lesions noted; no rash etc...  RADIOLOGY DATA:  Reviewed in the EPIC EMR & discussed w/ the patient...  LABORATORY DATA:  Reviewed in the EPIC EMR & discussed w/ the patient...   Assessment:      COPD, Hx Pneumonia>  baseline severe COPD/Emphysema w/ Cardiomeg, Aneurysm & bibasilar fibrosis, stable on Advair250, Spiriva, Mucinex...  HBP>  on Losartan100, Lasix40, Aten25 "as needed for BP>110 but says that he "seldom takes this med"; 7/15 Cr incr to 2.0 7 rec to cut the Losartan to 50...  CAD/ CHF/ etc>  Followed by Cherly Hess & his prev notes are reviewed;  BNP in ER 12/14 was up to 4200 & on Lasix40 it has improved to 184 by 5/15...  AFib>  On above + Coumadin followed in the [facility]...  Periph Vasc Dis>  S/p AAA repair 1998 by [facility]; then followed  by Ryan Hess for thoraco-abd  aneurysm & referred to [facility] for stent graft surg- done 4/13 by Ryan Hess w/ complications, and 2nd stage of the surgery 7/14 w/ 5 stents placed per Ryan Hess...  Ven Insuffic & EDEMA>  Prev improved w/ Lasix40/d;  He knows to elim salt, elev legs, wear support hose, etc;  He fell 7/15 w/ bruise reported, then followed by redness in skin & incr left leg swelling=> VenDoppler is neg for DVT & Protime was too thin=> adjusted; swelling resolved over time & back to baseline...  CHOL>  FLP looks good on Lip10...  Subclinical Hypothyroid>  TSH was 6.21 and Synthroid50 started 5/14=> TSH improved to 1.92...  GI> Presbyesoph, Divertics, Hx polpyps>  Stable & followed by DrDBrodie... 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  Prostate Cancer>  Followed by Ryan Hess  w/ slowly rising PSA as noted; then developed urinary retention after lumbar surg and hematuria after foley & coumadin; now back to baseline & being followed...  DJD>  Followed by Ryan Hess, s/p bilat TKRs...  ANEMIA>  He developed acute blood loss anemia w/ post op Hg= 11-12 but dropped to 8.6 w/ hematuria & INR=4.2; Coumadin & ASA placed on HOLD; f/u labs showed Hg=8.4 & Fe=31 & started on FeSO4 Bid;  Then Hg=9.5 & rec to continue Fe Bid;  Cards restarted his Coumadin;  Now Hg= 13.2 & Fe improved, ok to wean off Fe...  Actinic skin changes>  Aware, he is monitored by Derm...     Plan:     Patient's Medications  New Prescriptions   No medications on file  Previous Medications   ASPIRIN 81 MG TABLET    Take 81 mg by mouth daily.    ATORVASTATIN (LIPITOR) 10 MG TABLET    Take 1 tablet (10 mg total) by mouth daily.   FLUTICASONE-SALMETEROL (ADVAIR DISKUS) 250-50 MCG/DOSE AEPB    inhale 1 dose by mouth twice a day   FUROSEMIDE (LASIX) 40 MG TABLET    Take 1 tablet (40 mg total) by mouth daily.   GUAIFENESIN (MUCINEX) 600 MG 12 HR TABLET     Take 600 mg by mouth 2 (two) times daily. COPD.   IBUPROFEN (ADVIL,MOTRIN) 200 MG TABLET    Take 200 mg by mouth every 6 (six) hours as needed.   LEVOTHYROXINE (SYNTHROID, LEVOTHROID) 50 MCG TABLET    Take 1 tablet (50 mcg total) by mouth daily before breakfast.   LOSARTAN (COZAAR) 50 MG TABLET    Take 50 mg by mouth daily.   MULTIPLE VITAMIN (MULTIVITAMIN) TABLET    Take 1 tablet by mouth daily.     TAMSULOSIN (FLOMAX) 0.4 MG CAPS CAPSULE    Take 1 capsule (0.4 mg total) by mouth at bedtime.   TIOTROPIUM (SPIRIVA HANDIHALER) 18 MCG INHALATION CAPSULE    Place 1 capsule (18 mcg total) into inhaler and inhale daily.   TRAMADOL (ULTRAM) 50 MG TABLET    TAKE 1 TABLET BY MOUTH 3 TIMES A DAY AS NEEDED FOR PAIN   WARFARIN (COUMADIN) 2.5 MG TABLET    Take as directed by anticoagulation clinic  Modified Medications   No medications on file  Discontinued Medications   ACETAMINOPHEN (TYLENOL) 500 MG TABLET    Take 500 mg by mouth every 6 (six) hours as needed.   ATENOLOL (TENORMIN) 25 MG TABLET    TAKE 1 TABLET BY MOUTH AS NEEDED (IF BLOOD  PRESSURE IS OVER 110)

## 2014-08-27 ENCOUNTER — Other Ambulatory Visit: Payer: Self-pay | Admitting: Pulmonary Disease

## 2014-10-25 ENCOUNTER — Other Ambulatory Visit: Payer: Self-pay | Admitting: Cardiovascular Disease

## 2014-10-30 ENCOUNTER — Ambulatory Visit (INDEPENDENT_AMBULATORY_CARE_PROVIDER_SITE_OTHER): Payer: Medicare Other | Admitting: *Deleted

## 2014-10-30 DIAGNOSIS — I4891 Unspecified atrial fibrillation: Secondary | ICD-10-CM

## 2014-10-30 LAB — POCT INR: INR: 1.7

## 2014-10-30 MED ORDER — WARFARIN SODIUM 2.5 MG PO TABS: ORAL_TABLET | ORAL | Status: DC

## 2014-11-13 ENCOUNTER — Ambulatory Visit (INDEPENDENT_AMBULATORY_CARE_PROVIDER_SITE_OTHER): Payer: Medicare Other | Admitting: *Deleted

## 2014-11-13 DIAGNOSIS — I4891 Unspecified atrial fibrillation: Secondary | ICD-10-CM

## 2014-11-13 LAB — POCT INR: INR: 2.9

## 2014-12-01 ENCOUNTER — Telehealth: Payer: Self-pay | Admitting: Pulmonary Disease

## 2014-12-01 NOTE — Telephone Encounter (Signed)
Error.Stanley A Dalton ° °

## 2014-12-08 ENCOUNTER — Ambulatory Visit (INDEPENDENT_AMBULATORY_CARE_PROVIDER_SITE_OTHER): Payer: Medicare Other | Admitting: Pharmacist Clinician (PhC)/ Clinical Pharmacy Specialist

## 2014-12-08 DIAGNOSIS — I4891 Unspecified atrial fibrillation: Secondary | ICD-10-CM

## 2014-12-08 LAB — POCT INR: INR: 2.4

## 2014-12-14 ENCOUNTER — Ambulatory Visit (INDEPENDENT_AMBULATORY_CARE_PROVIDER_SITE_OTHER): Payer: Medicare Other | Admitting: Pulmonary Disease

## 2014-12-14 ENCOUNTER — Encounter: Payer: Self-pay | Admitting: Pulmonary Disease

## 2014-12-14 ENCOUNTER — Other Ambulatory Visit (INDEPENDENT_AMBULATORY_CARE_PROVIDER_SITE_OTHER): Payer: Medicare Other

## 2014-12-14 VITALS — BP 122/60 | HR 102 | Temp 97.1°F | Ht 69.0 in | Wt 182.0 lb

## 2014-12-14 DIAGNOSIS — N289 Disorder of kidney and ureter, unspecified: Secondary | ICD-10-CM

## 2014-12-14 DIAGNOSIS — I481 Persistent atrial fibrillation: Secondary | ICD-10-CM | POA: Diagnosis not present

## 2014-12-14 DIAGNOSIS — I4819 Other persistent atrial fibrillation: Secondary | ICD-10-CM

## 2014-12-14 DIAGNOSIS — E039 Hypothyroidism, unspecified: Secondary | ICD-10-CM

## 2014-12-14 DIAGNOSIS — E7800 Pure hypercholesterolemia, unspecified: Secondary | ICD-10-CM

## 2014-12-14 DIAGNOSIS — M545 Low back pain, unspecified: Secondary | ICD-10-CM

## 2014-12-14 DIAGNOSIS — I714 Abdominal aortic aneurysm, without rupture, unspecified: Secondary | ICD-10-CM

## 2014-12-14 DIAGNOSIS — I10 Essential (primary) hypertension: Secondary | ICD-10-CM

## 2014-12-14 DIAGNOSIS — M159 Polyosteoarthritis, unspecified: Secondary | ICD-10-CM

## 2014-12-14 DIAGNOSIS — F419 Anxiety disorder, unspecified: Secondary | ICD-10-CM

## 2014-12-14 DIAGNOSIS — M150 Primary generalized (osteo)arthritis: Secondary | ICD-10-CM

## 2014-12-14 DIAGNOSIS — I712 Thoracic aortic aneurysm, without rupture, unspecified: Secondary | ICD-10-CM

## 2014-12-14 DIAGNOSIS — I5022 Chronic systolic (congestive) heart failure: Secondary | ICD-10-CM

## 2014-12-14 DIAGNOSIS — R413 Other amnesia: Secondary | ICD-10-CM | POA: Insufficient documentation

## 2014-12-14 DIAGNOSIS — C61 Malignant neoplasm of prostate: Secondary | ICD-10-CM

## 2014-12-14 DIAGNOSIS — I739 Peripheral vascular disease, unspecified: Secondary | ICD-10-CM

## 2014-12-14 DIAGNOSIS — J449 Chronic obstructive pulmonary disease, unspecified: Secondary | ICD-10-CM

## 2014-12-14 DIAGNOSIS — J4489 Other specified chronic obstructive pulmonary disease: Secondary | ICD-10-CM

## 2014-12-14 LAB — CBC WITH DIFFERENTIAL/PLATELET
Basophils Absolute: 0 10*3/uL (ref 0.0–0.1)
Basophils Relative: 0.6 % (ref 0.0–3.0)
Eosinophils Absolute: 0.2 10*3/uL (ref 0.0–0.7)
Eosinophils Relative: 2.2 % (ref 0.0–5.0)
HCT: 42.9 % (ref 39.0–52.0)
Hemoglobin: 14.4 g/dL (ref 13.0–17.0)
Lymphocytes Relative: 16.8 % (ref 12.0–46.0)
Lymphs Abs: 1.2 10*3/uL (ref 0.7–4.0)
MCHC: 33.5 g/dL (ref 30.0–36.0)
MCV: 93.9 fl (ref 78.0–100.0)
Monocytes Absolute: 0.8 10*3/uL (ref 0.1–1.0)
Monocytes Relative: 11.3 % (ref 3.0–12.0)
Neutro Abs: 5.1 10*3/uL (ref 1.4–7.7)
Neutrophils Relative %: 69.1 % (ref 43.0–77.0)
Platelets: 186 10*3/uL (ref 150.0–400.0)
RBC: 4.57 Mil/uL (ref 4.22–5.81)
RDW: 14.7 % (ref 11.5–15.5)
WBC: 7.4 10*3/uL (ref 4.0–10.5)

## 2014-12-14 LAB — HEPATIC FUNCTION PANEL
ALT: 22 U/L (ref 0–53)
AST: 27 U/L (ref 0–37)
Albumin: 3.8 g/dL (ref 3.5–5.2)
Alkaline Phosphatase: 151 U/L — ABNORMAL HIGH (ref 39–117)
Bilirubin, Direct: 0.4 mg/dL — ABNORMAL HIGH (ref 0.0–0.3)
Total Bilirubin: 2 mg/dL — ABNORMAL HIGH (ref 0.2–1.2)
Total Protein: 7 g/dL (ref 6.0–8.3)

## 2014-12-14 LAB — BASIC METABOLIC PANEL
BUN: 32 mg/dL — ABNORMAL HIGH (ref 6–23)
CO2: 26 mEq/L (ref 19–32)
Calcium: 9.7 mg/dL (ref 8.4–10.5)
Chloride: 104 mEq/L (ref 96–112)
Creatinine, Ser: 1.85 mg/dL — ABNORMAL HIGH (ref 0.40–1.50)
GFR: 36.75 mL/min — ABNORMAL LOW (ref >60.00)
Glucose, Bld: 93 mg/dL (ref 70–99)
Potassium: 4.3 mEq/L (ref 3.5–5.1)
Sodium: 141 mEq/L (ref 135–145)

## 2014-12-14 LAB — TSH: TSH: 2.82 u[IU]/mL (ref 0.35–4.50)

## 2014-12-14 LAB — LIPID PANEL
Cholesterol: 133 mg/dL (ref 0–200)
HDL: 42.6 mg/dL (ref >39.00)
LDL Cholesterol: 74 mg/dL (ref 0–99)
NonHDL: 90.4
Total CHOL/HDL Ratio: 3
Triglycerides: 82 mg/dL (ref 0.0–149.0)
VLDL: 16.4 mg/dL (ref 0.0–40.0)

## 2014-12-14 MED ORDER — DONEPEZIL HCL 10 MG PO TABS: ORAL_TABLET | ORAL | Status: DC

## 2014-12-14 MED ORDER — TIOTROPIUM BROMIDE MONOHYDRATE 18 MCG IN CAPS: 18.0000 ug | ORAL_CAPSULE | Freq: Every day | RESPIRATORY_TRACT | Status: DC

## 2014-12-14 NOTE — Progress Notes (Signed)
Subjective:     Patient ID: Ryan Hess, male   DOB: Jan 04, 1924, 81 y.o.   MRN: [id number]  HPI 81 y/o WM here for a follow up visit... he has multiple medical problems as noted below...   ~  SEE PREV EPIC NOTES FOR OLDER DATA >>   ~  Feb 04, 2013:  62moROV & Ryan Hess [facility] the [facility]acoabd aneurysm [facility]- he had his situation reviewed by DrBrabham here & [facility] since this needs to be done endovascularly & is quite complicated... We reviewed the following medical problems during today's office visit >>     COPD, HxPneumonia> on Advair250, Spiriva, Mucinex; last CT 12/12 w/ severe emphysema & scarring, plus his extensive atherosclerotic dis/ ectatic thorAo/ aneurysm/ etc; his breathing is stable- mild cough, min sput, no hemoptysis, stable DOE, etc...     HBP, CHF, AFib w/ hx clot in LA appendage- resolved on Coumadin> on ASA81, Aten25, Lasix40, K10Bid; BP= 114/72 & he denies CP, palpit etc; followed by DCherly Hensenfor Cards- his notes are reviewed, stable- no changes made...    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 DrFarbers note indicates that to finish the repair he will require 3 branches and a left renal fenestration, they quoted him a risk of paraplegia in the 10-15% range & he will need preadmission for a lumbar drain to be placed, etc; [facility]    Chol> on Lip10; last FLP 12/12 showed TChol 107, TG 38, HDL 40, LDL 60    Hypothyroid> we have been following his TSH levels and he has been consistently 6-7 range; Labs 5/14  showed TSH=6.21 & we decided to start SYNTHROID5369m/d     GI- Divertics, Polyps, Hx ischemic bowel> the latter was due to an internal hernia & closed loop obstruction & not from any thrombotic or embolic phenomenon; he has done well since the surgery 7/13...    GU- BPH, hx prostate cancer, Renal insuffic> on Flomax0.4; followed by DrWrenn & he elected XRT- admin by DrKinard in 2009; he has a slowly rising PSA w/ a doubling time of 6-69m55moe is considerinjg androgen ablation if necessary, Cr=1.5... Marland KitchenMarland Kitchen DJD> on Oxy-IR 5mg56mn; he is s/p bilat TKRs by DrOlin...    Hx Anemia> prev on Fe; Hg increased from 9.5 post op to 13.2 w/ Fe=70 (21% sat) in Oct2013... We reviewed prob list, meds, xrays and labs> see below for updates >>   LABS 5/14:  Chems- ok w/ stable mild RI (Cr=1.5);  CBC- wnl w/ Hg=14.2;  TSH=6.21 (Synthroid50 started);  BNP=263;  Protimes adjusted by the CC...   ~  May 19, 2013:  74mo 469mo& [name] his [facility]13 in a >4h operation w/ special spinal precautions he says; he developed a blood clot in his left arm w/ some persist swelling; [facility] and labs done last week (we do not yet have surgical or  post op notes from them in [place]);  Pt reports that everything was OK but DrFarber wants to see better flow throught the areas of concern;  He has lost 7# down to 174# today;  BP looks good at 110/64 and O2 sats are 97% on RA at rest;  His CC is pain, esp back pain & he does not want to take narcotics- out of Percocet & he prefers Tramadol50 up to Tid & a musc relaxer- best covered is Tizanidine7m tid as needed...  ~  August 19, 2013:  342moOV & Ryan Hess c/o some LBP after trying to lift wife several weeks ago; he has Tramadol Tid & using heat, we will refill Tizanidine and rec f/u w/ Ortho if not improving for poss shot... Otherw stable, doing satis overall...     Breathing is stable on Advair,  spiriva, & Mucinex...    BP & Cardiovasc stable on Aten, Lasix, Coumadin...     Lipids stable on Lip10...     Thyroid stable on Synthroid50=> due for f/u TSH but wants to wait til next OV for FASTING blood work...    GU, BPH are stable on flomax0.42m92m...  We reviewed prob list, meds, xrays and labs> see below for updates >> he had the 2014 Flu vaccine 10/14...   CXR 10/14 showed chr changes of Ao stent graft, stable cardiomegaly, no infiltrates, NAD...   LABS 10/14:  CBC- wnl;  Protimes followed in CC...    ~  September 11, 2013:  3wk ROV & Ryan Hess added-on at Ryan Hess's request for 2 issues>>    1st> Ryan Hess called for refill of Losartan which we did not have on his list; Ryan Hess notes that [facility] that his BP has been higher, they did not disch him on any BP meds, so about 33mo49mo he started his dad on an old prescription for Losartan100; when he was here 11/25 & his BP read 118/62 he was taking the Losartan100 & Lasix40 (he has Aten25 but "very seldom" takes that & it is written for 1/d prn BP>110);  Today is BP= 120/60 on the Lasix40 & Losartan100- asked to continue the same 7 Rx written for these...    2nd> he had sudden pleuritic right CP 12/14 & went to the ER- described as sharp, worse w/ DB but also worse w/ palp on right lower chest wall; no SOB, no sput, ?min streaky hemoptysis; ER eval showed norm resp effort, tender chest wall, no rubs; CXR w/o active dis and CTA showed no evid PE (see full reports for all the chronic changes); Labs were ok x BNP=4200; he reports prompt resolution of the CP w/ MS x2 and Toradol x1; the pain has not returned, and as a fringe benefit- the Vicodin he was given via the Er has really helped his daily LBP (his chief complaint...    We reviewed prob list, meds, xrays and labs> see below for updates >> we refilled Hydrocodone5-325, Losartan100, Lasix40, Flomax0.4... HMarland KitchenMarland Kitchenhas planned cardiac f/u soon to address the BNP of 4200 on Lasix40 w/ BUN=26  Cr=1.3  ~  Feb 09, 2014:  83mo 783mo& GeorgShimshonbeen quite stable, breathing has been good but they are concerned regarding report of donut hole med cost of $800/mo or more... [facility] & was told that everything looks great... We reviewed the following medical problems during today's office visit >>  COPD, HxPneumonia> on Advair250, Spiriva, Mucinex; last CT 12/12 w/ severe emphysema & scarring, plus his extensive atherosclerotic dis/ ectatic thorAo/ aneurysm/ etc; his breathing is stable- mild cough, min sput, no hemoptysis, stable DOE, etc...     HBP, CHF, AFib w/ hx clot in LA appendage- resolved on Coumadin> on ASA81, Coumadin, Aten25 (if BP>110), Losartan100, Lasix40; BP= 120/64 & he denies CP, palpit etc; followed by Cherly Hensen for Cards- his notes are reviewed, stable- no changes made...    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 DrFarbers note indicates that to finish the repair he will require 3 branches and a left renal fenestration, they quoted him a risk of paraplegia in the 10-15% range; s/p surg [facility] by DrFarber w/ 5 stents placed 04/15/13 in a >4h operation w/ special spinal precautions- he developed a blood clot in his left arm w/ some persist swelling; he has reg f/u at [facility], seen 5/15 & doing well; BNP=184...     Chol> on Lip10; last FLP 12/12 showed TChol 107, TG 38, HDL 40, LDL 60; we discussed return for f/u FLP...    Hypothyroid> we have been following his TSH levels and he has been consistently 6-7 range; we decided to start SYNTHROID42mg/d in 2014 and f/u TSH 5/15 on Levothy50 = 1.92    GI-  Divertics, Polyps, Hx ischemic bowel> the latter was due to an internal hernia & closed loop obstruction & not from any thrombotic or embolic phenomenon; he has done well since the surgery 7/13...    GU- BPH, hx prostate cancer, Renal insuffic> on Flomax0.4; followed by DrWrenn & he elected XRT- admin by DrKinard in 2009; he has a slowly rising PSA w/ a doubling time of 6-981mohe is considerinjg androgen ablation if necessary, Cr=1.6...Marland KitchenMarland Kitchen  DJD> on Oxy-IR 70m48mid from Pain management; he is s/p bilat TKRs by DrOlin...    Hx Anemia> prev on Fe; Hg increased from 9.5 post op to 13.2 w/ Fe=70 (21% sat) in Oct2013... f/u labs 5/15 showed Hg=13.2 We reviewed prob list, meds, xrays and labs> see below for updates >>   LABS 5/15:  Chems- ok x BUN=26 Cr=1.6;  CBC- wnl w/ Hg=13.2;  TSH=1.92 on Levothy50;  BNP=184...   ~  April 08, 2014:  37mo91mo & add-on appt requested for swelling in left leg> pt reports that he fell going to the bathroom ~2wks ago w/ bruise on left brow, left hand & left leg; the bruising has since resolved but Ryan Hess noticed that left leg is red in the skin & leg is swollen to twice the size of the right leg, sl tender, not warm/hot, no open areas or drainage;  He has chronic venous insuffic & some mild edema but the redness is new & the L>R leg swelling is new;  He is on chr coumadin rx due to his AFib & [facility].70mg 24ms- taking 1x5d=TWThSS and 1/2 x2d=MF; last Protime was 03/06/14 w/ INR=2.6, kept the same & asked to f/u 04/17/14... We reviewed prob list, meds, xrays and labs> see below for updates >>   LABS 7/15:  Chems- ok x BUN=33 Cr=2.0;  CBC- ok w/ Hg=12.2 WBC=6.9;  Protime=49sec, INR=4.8;  Sed=37  D-Dimer=2.50  CRP=4.4  VenDopplers 7/15 showed NEG for DVT... REC> NO COUMADIN TODAY, we will call result to the CC for med adjustment & f/u; Rec incr water, no salt, elev legs & continue Lasix20; if swelling persists he will [facility]...  ~  August 14, 2014:  64moROV & Ryan Hess reports stable, no new complaints over the interval, but his Ryan Hess reports more forgetful- they are not yet ready to consider [name] rx... Last OV 7/15 he had left leg swelling, VenDopplers were neg for DVT & he was already on Coumadin due to his AFib; he has elim sodium, elev legs, wearing support hose & takingthe Lasix20- in the interval his swelling is gone & he is back to baseline (this is despite a 12# wt gain , eating better & he looks better... We reviewed the following medical problems during today's office visit >>     COPD, HxPneumonia> on Advair250, Spiriva, Mucinex; last CT 12/12 w/ severe emphysema & scarring, plus his extensive atherosclerotic dis/ ectatic thorAo/ aneurysm/ etc; his breathing is stable- mild cough, min sput, no hemoptysis, stable DOE, etc...     HBP, CHF, AFib w/ hx clot in LA appendage- resolved on Coumadin> on ASA81, Coumadin, Losartan50, Lasix40; BP= 130/72 & he denies CP, palpit etc; followed by DCherly Hensenfor Cards- last seen 12/14, stable- no changes made...    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 DrFarbers note indicates that to finish the repair he will require 3 branches and a left renal fenestration, they quoted him a risk of paraplegia in the 10-15% range; [facility]/ 5 stents placed 04/15/13 in a >4h operation w/ special spinal precautions- he developed a blood clot in his left arm w/ some persist swelling; [facility] & doing well; BNP=184...     Chol> on Lip10; last FLP 12/12 showed TChol 107, TG 38, HDL 40, LDL 60; we discussed  return for f/u FLP!.[name].    Hypothyroid> we have been following his TSH levels and he has been consistently 6-7 range; we decided to start SYNTHROID544m/d in 2014 and f/u TSH 5/15 on Levothy50 = 1.92    GI- Divertics, Polyps, Hx ischemic bowel> the latter was due to an internal hernia & closed loop obstruction & not from any thrombotic or embolic phenomenon; he has done well since the surgery 7/13...    GU- BPH, hx prostate cancer, Renal insuffic> on Flomax0.4; followed by DrWrenn & he elected XRT- admin by DrKinard in 2009; he had a slowly rising PSA w/ a doubling time of ~1y65yre is considering restaging & androgen ablation if necessary, Cr=1.6-2.0 range...    DJD> he is s/p bilat TKRs by DrOlin; on Tramadol50 & Tylenol as needed...    Hx Anemia> prev on Fe; Hg increased from 9.5 post op to 13.2 w/ Fe=70 (21% sat) in Oct2013... f/u labs 5/15 showed Hg=13.2 We reviewed prob list, meds, xrays and labs> see below for updates >> OK 2015 Flu vaccine today...  ~  December 14, 2014:  19moROV & GJeramiahad a follow up appt [facility]/ [facility] and [place]> see details below but everything stable and no med changes required:  CXR 1/16 revealed stable thoracoabdominal aortic endograft- no change, stable heart size, COPD/Emphysema & mild right basilar atelectasis, NAD...  Abd XRay 1/16 showed 4-vessel fenestrated thoracoabdominal aortic endograft w/ branch stents in celiac/ SMA/ & bilat renal arts- all unchanged, extensive DDD in spine, L4 compression- all unchanged...   Renal/ Mesenteric Ultrasound 1/16> no evid of hemodynamically signif RAS noted bilat, right renal cyst, distal SMA stent/ mid & distal SMA were normal, celiac & prox SMA stent not visualized...   CT Chest/ Abd/ Pelvis 1/16 w/o contrast> stable to sl decr in size of the mid thoracic Ao aneurysm, stable appearance of the 4-vessel fenestrated thoracoabdominal endograft; thickening & trabeculation of the  bladder wall & prox ascending bowel wall thickening... NOTE> they rec f/u w/ Urology & GI in light of these findings... Ryan Hess [facility] he states he is feeling well, now 81y/o, and denies CP, palpit, dizzy, change is dyspnea, & no edema; he is able to walk to the mailbox slowly & does ok- this is stable; he notes min cough/ beige sput/ no hemoptysis or f/c/s; he notes that he is a little more forgetful & Ryan Hess confirms some progressive decline in memory- they are ready to try [name] Rx (start w/ 52md 7 incr to 10106m)...     COPD, HxPneumonia> on Advair250Bid & Spiriva daily, plus Mucinex600Bid; denies resp exac- continue same, avoid infections...    Cards- HBP, CHF, AFib> on Coumadin via CC, ASA81, Losar100, Lasix40; BP= 122/60 & they note that DrFarber doesn't want it too low due to his atherosclerosis & the endograft; he is overdue for Cards follow up visit...     Peripheral Vasc Dis> see above...    Hyperlipidemia> on Atorva10; FLP today showed TChol 133, TG 82, HDL 43, LDL 74... continue same Rx...    Hypothyroid> on Synthroid50; clinically euthyroid & Labs 3/16 showed TSH= 2.82...    GI- HxDivertics, Polyps, ischemic bowel> see above; CT Abd&Pelvis [facility] in bowel wall in Prox asc colon; he saw DrDBrodie last 2012 & they decided to forgo any f/y colonoscopies for the obvious reasons; we will follow clinically- watch blood counts and stools, DrFarber plans f/u CT scans in another 77mo19mo    GU- prostate cancer and RI> he is followed by DrWrenn for Urology & seen several weeks ago by his hx (we do not have notes from them); pt reports everything was good & I [facility] & scan to Urologist's attn...    DJD> GeorJadaears to be stable, s/p bilat TKRs, on Tramadol & tylenol prn...    Anemia> Labs today showed Hg= 14.4, MCV= 94, all back to wnl... We reviewed prob list, meds, xrays and labs> see below for updates >>    LABS 3/16:  FLP- at goals on Atorva10;  Chems- ok w/ Cr improved to 1.85;  CBC- wnl;  TSH=2.82 on Synthroid50... PLAN>>  We decided to start [name] 5=>10mg75mor his vascular dementia & memory loss;  He will f/u w/ Cards at his earliest convenience; [facility] & scan report to DrWrenn, Urology to review...            Problem List:  COPD (  ICD-496) Hx of PNEUMONIA, ORGANISM UNSPECIFIED (ICD-486) PULMONARY NODULE & CAVITY RLL (ICD-518.89) - he is an ex-smoker, having quit in 1990 after 40 yrs of smoking... he was exercising regularly and walking daily ~48m 5-6 days per week... on ALawrencedaily, +MUCINEX 2Bid w/ Fluids... ~  baseline CXR & CTChest w/ marked emphysema, atheromatous calcif, 5cm desc thorAA...  ~  CXR & CTA 2/11 in hosp showed cardiomeg + coronary calcif, advanced atherosclerosis of Ao w/ aneuryms 5.8cm into abd, underlying emphysema w/ interst edema & bilat effusions, no PE... ~  4/11:  RLL pneumonia which was slow to clear... ~  6/11:  f/u CXR & CT Chest 6/11 w/ 6cm cavitary area RLL & nodular component inferiorly, +hilar adenopathy, no mediastinal nodes, etc; Tumor markers showed CEA=7.5 & Ca19-9= 10.8; > IR felt lesion was too hi risk for bx, therefore contin Rx & observe. ~  8/11:  f/u CXR w/o change in RLL opac (no worsening)>> repeat CEA=6.5; plan f/u CT Chest in 242mo~  10/11: f/u CT Chest showed interval decrease in size of RLL cavitary lesion & the inferiorly placed soft tissue component; otherw there is severe emphysema, cardiomeg, coronary calcif, thoracoabd aneurysm w/o change; there was an air-fluid level in a dilated esoph> check Ba Esophagram= Nonspecific esophageal motility disorder with very poor primary esophageal contractions (no HH, stricture, etc)... ~  12/12: f/u CTChest showed severe emphysema & scarring w/ bibasilar atx (no infiltrates no cavities), markedly tortuous & ectaticThorAo w/ extensive atherosclerotic changes & no change in  decr thor aneurysm measuring 5.9 x 5.6cm, mild cardiomeg & dense coronary calcif as well... ~  4/13:  He had a fenestrated endovasc repair of [facility]abdominal [facility] ~  subseq CXRs thru 7/13 hosp w/ cardiomeg, low lung vols, bibasilar airsp dis, etc... ~  CXR 1/14 showed cardiomeg, bibasilar fibrosis, thoracic aortic stent noted, NAD...[name]~  7/14:  ?RUL pneumonia treated w/ Avelox & resolved=> [facility]  CXR 10/14 showed chr changes of Ao stent graft, stable cardiomegaly, no infiltrates, NAD...[name]~  5/15: on Advair250, Spiriva, Mucinex; last CT 12/12 w/ severe emphysema & scarring, plus his extensive atherosclerotic dis/ ectatic thorAo/ aneurysm/ etc; his breathing is stable- mild cough, min sput, no hemoptysis, stable DOE, etc. ~  11/15: stable on same meds... ~  [facility]- no change, stable heart size, COPD/Emphysema & mild right basilar atelectasis, NAD...Marland KitchenMarland Kitchen  CT Chest (along w/ Abd&Pelvis) [facility] to sl decr in size of the mid thoracic Ao aneurysm, stable appearance of the 4-vessel fenestrated thoracoabdominal endograft... ~  3/16: on Advair250Bid & Spiriva daily, plus Mucinex600Bid; denies resp exac; [facility]- stable & no changes made...    HYPERTENSION (ICD-401.9)                         << followed by DrCherly Hensenor Cards >> CONGESTIVE HEART FAILURE (ICD-428.0) ATRIAL FIBRILLATION (ICD-427.31) w/ clot in left atrial appendage >> resolved on f/u TEE, on COUMADIN per Cards & cardioverted. PREMATURE VENTRICULAR CONTRACTIONS, FREQUENT (ICD-427.69)  MEDS> prev on ATENOLOL 2538m, & LASIX 26m18mM + KCl 10mE43m, and off Losartan... prev Norvasc discontinued & hx ACE cough in the past on Lisinopril. ~  NuclearStressTest 1/05 was neg- no ischemia or infarct (+diaphrag attenuation), EF=56%... ~  repeat Nuclear study 6/09 was neg- no ischemia,  mild  inferoapic thinning, not gated due to PVCs... ~  2DEcho 6/09 showed mild dilated LV w/ EF= 45-50% but no regional wall motion abn, mild AoV calcif & AI, LA mild dil... ~  12/10:  changed from [place] to [place] to save $$ ~  2/11:  in hosp- Cath= mild nonobstructive 3 vessel CAD, mod LVD w/ EF=40-45%; TEE= CHF w/ EF~45% w/ HK & left atrial appendage clot & AFib; he was diuresed & Norvasc stopped, placed on Coumadin w/ careful f/u by Cherly Hensen & the CC... ~  Subseq successful [facility] & holding NSR w/ PVCs... ~  Recurrent AFib w/ RVR after his Thoracoabdominal stent graft 4/13... ~  6/13:  BP was low assoc w/ GU bleeding & ATENOLOL25 & LOSARTAN50 placed on HOLD... ~  COUMADIN on HOLD since GU bleeding & acute blood loss anemia; it has not yet been restarted... ~  8/13:  BP= 116/78 on LASIX 88m/d & K10-Bid;  Wt down 14# to 173# ~  10/13:  BP= 114/82 on Lasix40+K1-Bid (he takes Aten25 if BP>110 at home)... ~  12/13: he had Cards f/u DrNishan> note reviewed, no change in meds, he referred him to DrGearhart for f/u stent as [facility] [place].. ~  5/14: HBP, CHF, AFib w/ hx clot in LA appendage- resolved on Coumadin> on ASA81, Aten25, Lasix40, K10Bid; BP= 114/72 & he denies CP, palpit etc; followed by DCherly Hensenfor Cards- his notes are reviewed, stable- no changes made. ~  8/14: [facility] DrFarber... ~  5/15: on ASA81, Coumadin, Aten25 (if BP>110), Losartan100, Lasix40; BP= 120/64 & he denies CP, palpit etc; followed by DCherly Hensenfor Cards- his notes are reviewed, stable- BNP 5/15= 184... ~  7/15: on ASA81, Coumadin, Aten25 (if BP>110), Losartan100, Lasix40; BP= 110/60 & he presented w/ incr left leg swelling after fall; Renal function sl worse w/ Cr=2.0 & rec to decr Losartan100=>50 ~  11/15: on ASA81, Coumadin, Losar50, Lasix40; BP= 130/72 & he denies CP, palpit etc; followed by DCherly Hensenfor Cards- last seen 12/14, stable- no changes made & he is  due for f/u visit....Marland KitchenMarland KitchenMarland Kitchen PERIPHERAL VASCULAR DISEASE (ICD-443.9) - back on ASA 83md...  ~  he is s/p infrarenal AAA repair w/ right common iliac aneurysm repair (via Ao Bi-iliac graft) in 1998 by DrLawson; also has a known desc thor AA measuring ~5+cm and followed by DrGearhart...  ~  seen by DrGearhart 4/10 w/ CT scan showing ~5cm thoracoabdominal aneurysm w/o signif change...  ~  seen by DrGearhart 2/11 in hosp w/ sl incr size of AAA- f/u planned 74m27mo  also followed by PV- DrCooper. ~  seen by DrGearhardt 10/11 & stable, no change, f/u 1 yr. ~  Seen 12/12 by DrGearhardt & CTscan is stable, continue conservative approach... ~  2/13:  [facility]... ~  4/13:  He had a fenestrated endovasc repair of Thor[facility]-abdominal AA [facility] by lumbar epidural hematomas (on Coumadin) w/ paraplegia requiring readmission for laminectomy & evac of the hematomas;  [facility] and [facility] by [facility] w/ altered mental status, pneumonia, AFib w/ rvr, & FTT> Neuro w/u was otherw neg (& he improved w/ supportive care)... ~  2/14:  CT Chest, Abdomen, Pelvis> per DrBrabham - see report (the abdominal component has enlarged)... 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 DrFarbers note indicates that to finish the repair he will require 3 branches and a left renal fenestration, they quoted him a risk of paraplegia in the 10-15% range & he will need preadmission for a lumbar drain to be placed, etc; he is awaiting the call from [facility]... ~  7/14: s/p surg  at [facility] by DrFarber w/ 5 stents placed 04/15/13 in a >4h operation w/ special spinal precautions- he developed a blood clot in his left arm w/ some persist swelling; he has reg f/u at [facility]... ~  He maintains a regular sched of f/u visits at [facility] clinic w/ XRays, CT scans, dopplers, etc on a regular basis- results reviewed in Care Everywhere section of Epic...  VENOUS INSUFFICIENCY, EDEMA >>  ~  He has chronic venous insuffic w/ mild chronic edema maintained on a low sodium diet, elevation, support hose when nec, & LASIX71m/d... ~  7/15:  Presented 2wks after fall at home, left leg red/ swollen/ sl tender, no broken skin/ drainage/ not hot etc; VenDopplers showed NEG for DVT & Protime was too thin... ~  11/15: edema resolved and back to baseline...  HYPERCHOLESTEROLEMIA (ICD-272.0) - on LIPITOR 139md...  ~  [place]07 showed TChol 115, Tg 58, HDL 36, LDL 67 ~  FLP 5/08 showed TChol 118, TG 66, HDL 31, LDL 74 ~  FLP 5/09 showed TChol 126, TG 71, HDL 37, LDL 75 ~  FLP 6/10 showed TChol 116, TG 73, HDL 40, LDL 62 ~  2/11:  FLP not checked during the hospitalization... ~  FLP 4/11 in hosp showed TChol 94, TG 52, HDL 23, LDL 61 ~  FLP 12/12 on Lip10 showed TChol 107, TG 38, HDL 40, LDL 60 ~  FLP 3/16 on Lip10 showed TChol 133, TG 82, HDL 43, LDL 74  HYPOTHYROIDISM >> he remains clinically euthyroid... ~  Labs 12/12 showed TSH= 6.10 ~  Labs 6/13 showed TSH= 6.66 ~  Labs 5/14 showed TSH= 6.21... We decided to start SYNTHROID 5073md... ~  Labs 5/15 on Levothy50 showed TSH= 1.92 ~  Labs 3/16 on Synthroid50 showed TSH= 2.82  DIVERTICULOSIS OF COLON (ICD-562.10) & COLONIC POLYPS (ICD-211.3) - hx polyps in 2003 = tubular adenoma... ~  last colonoscopy 9/06 showed divertics, hems, no polyps. ~  Pt followed by DrDBrodie & they decided to forgo any further GI procedures/ colonoscopies...  ISCHEMIC BOWEL >> GeoAntroneesented to the ER 7/23 - 04/25/12 w/ abd pain- ELap surg revealed ~5 feet of frankly  ischemic small bowel, likely due to internal hernia and closed loop obstruction=> Bowel resected & pulses intact w/ no evidence of embolic or thrombotic pathology;  Viable small bowel anastomosed & wound vac applied; he had a stable post op course...  RENAL INSUFFICIENCY >>  PROSTATE CANCER (ICD-185) - eval by DrWrenn and they decided on XRT by DrKinard- finished 5/09 and he states that DrKinard "released me"... he sees DrWrenn every 6 months & they follow PSA closely (notes reviewed)... ~  8/12: f/u prostate cancer dx in 2009 & treated w/ XRT completed 6/09; slowly rising PSA w/ doubling time 6-69mo7mo  min voiding symptoms, he is considering androgen ablation if needed... ~  6/13: He developed hematuria & urinary retention requiring Urology eval, discontinuation of Coumadin, & Foley placement=> subseq removed 8/13 w/ adeq voiding trial... ~  3/14:  He had f/u visit w/ DrWrenn- PSA nadir after XRT was <0.04 and it has climbed to 0.54 (3/14) w/ PSADT of ~39yr they chose to continue watchful waiting... ~  1/15:  DrWrenn reports that PSA is up to 1.46 ~  Labs 5/15 on Lasix40 showed Cr=1.6, BNP=184 ~  7/15: he had f/u w/ DrWrenn> BPH w/ BOO, Prostate ca; on Flomax0.4, PSA up to 1.84, neg bone scan x degen changes; they continue to follow w/ watchful waiting, ROV in 626mo. ~  He maintains regular f/u w/ drWrenn> we do not have recent clinic notes but pt indicates that everthing is ok... ~  [facility]16[facility] & reviewed in CaHalawandicates thickening & trabeculation of the bladder wall & prox ascending bowel wall thickening... NOTE> they rec f/u w/ Urology & records sent in light of these findings... ~  Labs 3/16 showed BUN=32, Cr= 1.85...  DEGENERATIVE JOINT DISEASE (ICD-715.90) - s/p bilat TKR's, [facility]- DrOlin... He has OXY-IR 45m72ms needed for pain...  ACTINIC SKIN DAMAGE (ICD-692.70) - followed by DrHBrunetta Jeanse knows to avoid sun exposure, use sun screen, etc...  ANEMIA  >> ~  Labs 6/13 showed Hg= 8.4, Fe= 31 (11%sat); Rec to start FeSO4 Bid... ~  Labs 7/13 in hosp showed Hg= 12.6=>8.2 at disch... ~  Labs 8/13 showed Hg= 9.5, MCV= 94; rec to continue FeSO4 Bid... ~  Labs 10/13 showed Hg= 13.2 & ok to wean Fe to 1/d til gone then stop... ~  Labs 5/14 showed Hg= 14.2 ~  Labs 5/15 showed Hg= 13.2 ~  Labs 3/16 showed Hg= 14.4   Past Surgical History  Procedure Laterality Date  . Total knee arthroplasty      bilateral  . Abdominal aortic aneurysm repair  1998  . Appendectomy    . Inguinal hernia repair      left  . Joint replacement    . Laparotomy  04/16/2012    Procedure: EXPLORATORY LAPAROTOMY;  Surgeon: BriMadilyn HookO;  [facility];  Laterality: N/A;  exploratory Laparotomy,Small bowel ressection abdominal wound vac placement.  . Laparotomy  04/18/2012    Procedure: EXPLORATORY LAPAROTOMY;  Surgeon: ThoJoyice Fasterornett, MD;  [facility];  Laterality: N/A;  exploratory laparotomy with small bowel anastomosis  . Application of wound vac  04/18/2012    Procedure: APPLICATION OF WOUND VAC;  Surgeon: ThoJoyice Fasterornett, MD;  [facility];  Laterality: N/A;  Removal of abdominal wound vac, Application of incisional  wound vac  . Repair of aa  04/14/2013    done at chaFormancounter Prescriptions as of 12/14/2014  Medication Sig  . aspirin 81 MG tablet Take 81 mg by mouth daily.   . aMarland Kitchenorvastatin (LIPITOR) 10 MG tablet Take 1 tablet (10 mg total) by mouth daily.  . Fluticasone-Salmeterol (ADVAIR DISKUS) 250-50 MCG/DOSE AEPB inhale 1 dose by mouth twice a day  . furosemide (LASIX) 40 MG tablet Take 1 tablet (40 mg total) by mouth daily.  . gMarland KitchenaiFENesin (MUCINEX) 600 MG 12 hr tablet Take 600 mg by mouth 2 (two) times daily. COPD.  . iMarland Kitchenuprofen (ADVIL,MOTRIN) 200 MG tablet Take 200 mg by mouth every  6 (six) hours as needed.  [name] levothyroxine (SYNTHROID, LEVOTHROID) 50 MCG tablet  Take 1 tablet (50 mcg total) by mouth daily before breakfast.  . losartan (COZAAR) 50 MG tablet Take 50 mg by mouth daily.  . Multiple Vitamin (MULTIVITAMIN) tablet Take 1 tablet by mouth daily.    . tamsulosin (FLOMAX) 0.4 MG CAPS capsule Take 1 capsule (0.4 mg total) by mouth at bedtime. (Patient taking differently: Take 0.4 mg by mouth daily after breakfast. )  . tiotropium (SPIRIVA HANDIHALER) 18 MCG inhalation capsule Place 1 capsule (18 mcg total) into inhaler and inhale daily.  . traMADol (ULTRAM) 50 MG tablet TAKE 1 TABLET BY MOUTH 3 TIMES A DAY AS NEEDED FOR PAIN  . warfarin (COUMADIN) 2.5 MG tablet Take as directed by anticoagulation clinic  . [DISCONTINUED] atorvastatin (LIPITOR) 10 MG tablet TAKE 1 TABLET DAILY    No Known Allergies   Current Medications, Allergies, Past Medical History, Past Surgical History, Family History, and Social History were reviewed in [employer] record.   Review of Systems        See HPI - all other systems neg except as noted...  The patient complains of dyspnea on exertion.  The patient denies anorexia, fever, weight loss, weight gain, vision loss, decreased hearing, hoarseness, chest pain, syncope, peripheral edema, prolonged cough, headaches, hemoptysis, abdominal pain, melena, hematochezia, severe indigestion/heartburn, hematuria, incontinence, muscle weakness, suspicious skin lesions, transient blindness, difficulty walking, depression, unusual weight change, abnormal bleeding, enlarged lymph nodes, and angioedema.     Objective:   Physical Exam    WD, WN, 81 y/o WM in NAD... GENERAL:  Alert & oriented; pleasant & cooperative; pale complexion... HEENT:  [place]/AT, EOM-wnl, PERRLA, EACs-clear, TMs-wnl, NOSE-clear, THROAT-clear & wnl. NECK:  Supple w/ fairROM; no JVD; normal carotid impulses w/o bruits; no thyromegaly or nodules palpated; no lymphadenopathy. CHEST:  decr BS bilat, scat bibasilar rales, w/o wheezing/ rhonchi/  signs of consolidation. HEART:  irregular rhythm, gr1/6 diast murmur in Ao area, without rubs or gallops detected... ABDOMEN:  Soft & nontender; normal bowel sounds; no organomegaly or masses palpated... EXT: s/p bilat TKR's, mod arthritic changes; +venous insuffic, left leg swelling/ red > right leg, no drainage NEURO:  CN's intact;  gait abn; no focal neuro deficits... DERM:  No lesions noted; no rash etc...  RADIOLOGY DATA:  Reviewed in the EPIC EMR & discussed w/ the patient...  LABORATORY DATA:  Reviewed in the EPIC EMR & discussed w/ the patient...   Assessment:      COPD, Hx Pneumonia>  baseline severe COPD/Emphysema w/ Cardiomeg, Aneurysm & bibasilar fibrosis, stable on Advair250, Spiriva, Mucinex...  HBP>  HBP, CHF, AFib> on Coumadin via CC, ASA81, Losar100, Lasix40; BP= 122/60 & they note that DrFarber doesn't want it too low due to his atherosclerosis & the endograft; he is overdue for Cards follow up visit...  CAD/ CHF/ etc>  Followed by Cherly Hensen & his prev notes are reviewed;  BNP in ER 12/14 was up to 4200 & on Lasix40 it has improved to 184 by 5/15...  AFib>  On above + Coumadin followed in the [facility]...  Periph Vasc Dis>  S/p AAA repair 1998 by Sheryn Bison; then followed by DrGearhardt for thoraco-abd aneurysm & referred to [facility] for stent graft surg- done 4/13 by DrFarber w/ complications, and 2nd stage of the surgery 7/14 w/ 5 stents placed per DrFarber... They continue to follow regularly w/ CT scans every 83mo..  Ven Insuffic & EDEMA>  Prev improved w/  Lasix40/d;  He knows to elim salt, elev legs, wear support hose, etc;  He fell 7/15 w/ bruise reported, then followed by redness in skin & incr left leg swelling=> VenDoppler is neg for DVT & Protime was too thin=> adjusted; swelling resolved over time & back to baseline...  CHOL>  FLP looks good on Lip10...  Subclinical Hypothyroid>  TSH was 6.21 and Synthroid50 started 5/14=> TSH improved to 2.82...  GI> Presbyesoph,  Divertics, Hx polpyps>  Stable & followed by DrDBrodie... 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  Prostate Cancer>  Followed by DrWrenn  w/ slowly rising PSA as noted; then developed urinary retention after lumbar surg and hematuria after foley & coumadin; now back to baseline & being followed...  DJD>  Followed by DrOlin, s/p bilat TKRs...  ANEMIA>  He developed acute blood loss anemia w/ post op Hg= 11-12 but dropped to 8.6 w/ hematuria & INR=4.2; Coumadin & ASA placed on HOLD; f/u labs showed Hg=8.4 & Fe=31 & started on FeSO4 Bid;  Then Hg=9.5 & rec to continue Fe Bid;  Cards restarted his Coumadin;  Now Hg= 14.4   Actinic skin changes>  Aware, he is monitored by Derm...     Plan:     Patient's Medications  New Prescriptions   DONEPEZIL ([name]) 10 MG TABLET    Take 1/2 tablet by mouth once daily x 30 days then increase to 1 tablet by mouth once daily thereafter   TIOTROPIUM (SPIRIVA) 18 MCG INHALATION CAPSULE    Place 1 capsule (18 mcg total) into inhaler and inhale daily.  Previous Medications   ASPIRIN 81 MG TABLET    Take 81 mg by mouth daily.    ATORVASTATIN (LIPITOR) 10 MG TABLET    Take 1 tablet (10 mg total) by mouth daily.   FLUTICASONE-SALMETEROL (ADVAIR DISKUS) 250-50 MCG/DOSE AEPB    inhale 1 dose by mouth twice a day   FUROSEMIDE (LASIX) 40 MG TABLET    Take 1 tablet (40 mg total) by mouth daily.   GUAIFENESIN (MUCINEX) 600 MG 12 HR TABLET    Take 600 mg by mouth 2 (two) times daily. COPD.   IBUPROFEN (ADVIL,MOTRIN) 200 MG TABLET    Take 200 mg by mouth every 6 (six) hours as needed.   LEVOTHYROXINE (SYNTHROID, LEVOTHROID) 50 MCG TABLET    Take 1 tablet (50 mcg total) by mouth daily before breakfast.   LOSARTAN (COZAAR) 50 MG TABLET    Take 50 mg by mouth daily.   MULTIPLE VITAMIN (MULTIVITAMIN) TABLET    Take 1 tablet by mouth daily.     TAMSULOSIN (FLOMAX) 0.4 MG  CAPS CAPSULE    Take 1 capsule (0.4 mg total) by mouth at bedtime.   TIOTROPIUM (SPIRIVA HANDIHALER) 18 MCG INHALATION CAPSULE    Place 1 capsule (18 mcg total) into inhaler and inhale daily.   TRAMADOL (ULTRAM) 50 MG TABLET    TAKE 1 TABLET BY MOUTH 3 TIMES A DAY AS NEEDED FOR PAIN   WARFARIN (COUMADIN) 2.5 MG TABLET    Take as directed by anticoagulation clinic  Modified Medications   No medications on file  Discontinued Medications   ATORVASTATIN (LIPITOR) 10 MG TABLET    TAKE 1 TABLET DAILY

## 2014-12-14 NOTE — Patient Instructions (Signed)
Today we updated your med list in our EPIC system...    Continue your current medications the same...  We are adding [name] (Donepizil) 10mg - start w/ 1/2 tab daily for 1 month, then increase to 1 tab daily thereafter...  Today we did your follow up FASTING blood work...    We will contact you w/ the results when available...   We will arrange for you to [facility] a follow up appt w/ DrNishan in the next few months...  Call for any questions...  Let's plan a follow up visit in 89mo, sooner if needed for problems.Marland KitchenMarland Kitchen

## 2014-12-16 ENCOUNTER — Telehealth: Payer: Self-pay | Admitting: Pulmonary Disease

## 2014-12-16 ENCOUNTER — Ambulatory Visit: Payer: Medicare Other | Admitting: Pulmonary Disease

## 2014-12-16 NOTE — Telephone Encounter (Signed)
Notes Recorded by Noralee Space, MD on 12/15/2014 at 11:11 AM Please notify patient> Labs generally look good... FLP is at goals on Lip10- continue same... Chems are ok w/ Cr=1.85, stable, no worse... CBC is normal 7 TSH is wnl on the synthroid50/d- continue same... -- lmtcb x1 for [employer]

## 2014-12-16 NOTE — Telephone Encounter (Signed)
Pt's son is aware of pt's lab results. Nothing further was needed.

## 2014-12-16 NOTE — Telephone Encounter (Signed)
Pt's son returned call - [phone]. Call after 2:00 please.

## 2014-12-20 ENCOUNTER — Other Ambulatory Visit: Payer: Self-pay | Admitting: Cardiovascular Disease

## 2014-12-22 NOTE — Progress Notes (Signed)
Quick Note:  lmtcb for pt. ______ 

## 2014-12-23 ENCOUNTER — Telehealth: Payer: Self-pay | Admitting: Pulmonary Disease

## 2014-12-23 NOTE — Progress Notes (Signed)
Quick Note:  LMTCB ______ 

## 2014-12-23 NOTE — Telephone Encounter (Signed)
Patient notified of lab results. Nothing further needed.  

## 2015-01-05 ENCOUNTER — Ambulatory Visit (INDEPENDENT_AMBULATORY_CARE_PROVIDER_SITE_OTHER): Payer: Medicare Other

## 2015-01-05 DIAGNOSIS — I4891 Unspecified atrial fibrillation: Secondary | ICD-10-CM | POA: Diagnosis not present

## 2015-01-05 LAB — POCT INR: INR: 2.5

## 2015-01-11 ENCOUNTER — Other Ambulatory Visit: Payer: Self-pay | Admitting: Pulmonary Disease

## 2015-02-16 ENCOUNTER — Other Ambulatory Visit: Payer: Self-pay | Admitting: Pulmonary Disease

## 2015-02-16 ENCOUNTER — Ambulatory Visit (INDEPENDENT_AMBULATORY_CARE_PROVIDER_SITE_OTHER): Payer: Medicare Other | Admitting: *Deleted

## 2015-02-16 DIAGNOSIS — I4891 Unspecified atrial fibrillation: Secondary | ICD-10-CM | POA: Diagnosis not present

## 2015-02-16 LAB — POCT INR: INR: 3.5

## 2015-03-01 ENCOUNTER — Ambulatory Visit (INDEPENDENT_AMBULATORY_CARE_PROVIDER_SITE_OTHER): Payer: Medicare Other

## 2015-03-01 DIAGNOSIS — I4891 Unspecified atrial fibrillation: Secondary | ICD-10-CM

## 2015-03-01 LAB — POCT INR: INR: 2.8

## 2015-03-06 ENCOUNTER — Other Ambulatory Visit: Payer: Self-pay | Admitting: Pulmonary Disease

## 2015-03-15 ENCOUNTER — Ambulatory Visit (INDEPENDENT_AMBULATORY_CARE_PROVIDER_SITE_OTHER): Payer: Medicare Other | Admitting: Pharmacist

## 2015-03-15 DIAGNOSIS — I4891 Unspecified atrial fibrillation: Secondary | ICD-10-CM | POA: Diagnosis not present

## 2015-03-15 LAB — POCT INR: INR: 3.1

## 2015-03-31 ENCOUNTER — Other Ambulatory Visit: Payer: Self-pay | Admitting: Pulmonary Disease

## 2015-04-05 ENCOUNTER — Ambulatory Visit (INDEPENDENT_AMBULATORY_CARE_PROVIDER_SITE_OTHER): Payer: Medicare Other | Admitting: *Deleted

## 2015-04-05 DIAGNOSIS — I4891 Unspecified atrial fibrillation: Secondary | ICD-10-CM | POA: Diagnosis not present

## 2015-04-05 LAB — POCT INR: INR: 3.7

## 2015-04-06 ENCOUNTER — Other Ambulatory Visit: Payer: Self-pay | Admitting: Pulmonary Disease

## 2015-04-07 ENCOUNTER — Other Ambulatory Visit: Payer: Self-pay | Admitting: Pulmonary Disease

## 2015-04-09 ENCOUNTER — Other Ambulatory Visit: Payer: Self-pay | Admitting: Cardiovascular Disease

## 2015-04-19 ENCOUNTER — Ambulatory Visit (INDEPENDENT_AMBULATORY_CARE_PROVIDER_SITE_OTHER): Payer: Medicare Other | Admitting: *Deleted

## 2015-04-19 DIAGNOSIS — I4891 Unspecified atrial fibrillation: Secondary | ICD-10-CM

## 2015-04-19 LAB — POCT INR: INR: 3.3

## 2015-05-03 ENCOUNTER — Ambulatory Visit (INDEPENDENT_AMBULATORY_CARE_PROVIDER_SITE_OTHER): Payer: Medicare Other

## 2015-05-03 DIAGNOSIS — I4891 Unspecified atrial fibrillation: Secondary | ICD-10-CM

## 2015-05-03 LAB — POCT INR: INR: 1.9

## 2015-05-17 ENCOUNTER — Ambulatory Visit (INDEPENDENT_AMBULATORY_CARE_PROVIDER_SITE_OTHER): Payer: Medicare Other

## 2015-05-17 DIAGNOSIS — I4891 Unspecified atrial fibrillation: Secondary | ICD-10-CM

## 2015-05-17 LAB — POCT INR: INR: 2.3

## 2015-05-26 ENCOUNTER — Other Ambulatory Visit: Payer: Self-pay | Admitting: Pulmonary Disease

## 2015-06-07 ENCOUNTER — Ambulatory Visit (INDEPENDENT_AMBULATORY_CARE_PROVIDER_SITE_OTHER): Payer: Medicare Other | Admitting: *Deleted

## 2015-06-07 DIAGNOSIS — I4891 Unspecified atrial fibrillation: Secondary | ICD-10-CM

## 2015-06-07 LAB — POCT INR: INR: 3.1

## 2015-06-16 ENCOUNTER — Encounter: Payer: Self-pay | Admitting: Pulmonary Disease

## 2015-06-16 ENCOUNTER — Ambulatory Visit (INDEPENDENT_AMBULATORY_CARE_PROVIDER_SITE_OTHER): Payer: Medicare Other | Admitting: Pulmonary Disease

## 2015-06-16 VITALS — BP 116/50 | HR 62 | Temp 97.5°F | Wt 146.6 lb

## 2015-06-16 DIAGNOSIS — M150 Primary generalized (osteo)arthritis: Secondary | ICD-10-CM | POA: Diagnosis not present

## 2015-06-16 DIAGNOSIS — Z23 Encounter for immunization: Secondary | ICD-10-CM

## 2015-06-16 DIAGNOSIS — M159 Polyosteoarthritis, unspecified: Secondary | ICD-10-CM

## 2015-06-16 NOTE — Progress Notes (Signed)
Subjective:     Patient ID: Ryan Hess, male   DOB: 1924/01/28, 81 y.o.   MRN: [id number]  HPI 81 y/o WM here for a follow up visit... he has multiple medical problems as noted below...   ~  SEE PREV EPIC NOTES FOR OLDER DATA >>    5/14:  Ryan Hess is awaiting a call from Ryan Hess at [facility] about the next stage in his planned thoracoabd aneurysm repair- he had his situation reviewed by Ryan Hess here & he rec f/u at [facility] per the original plan since this needs to be done endovascularly & is quite complicated...   LABS 5/14:  Chems- ok w/ stable mild RI (Cr=1.5);  CBC- wnl w/ Hg=14.2;  TSH=6.21 (Synthroid50 started);  BNP=263;  Protimes adjusted by the CC...   Ryan Hess has had his f/u surg at [facility] by Ryan Hess w/ 5 stents placed 04/15/13 in a >4h operation w/ special spinal precautions; he developed a blood clot in his left arm w/ some persist swelling...  CXR 10/14 showed chr changes of Ao stent graft, stable cardiomegaly, no infiltrates, NAD...   LABS 10/14:  CBC- wnl;  Protimes followed in CC...    ~  Feb 09, 2014:  30moROV & [name] been quite stable, breathing has been good but they are concerned regarding report of donut hole med cost of $800/mo or more... [facility] & was told that everything looks great... We reviewed the following medical problems during today's office visit >>     COPD, HxPneumonia> on Advair250, Spiriva, Mucinex; last CT 12/12 w/ severe emphysema & scarring, plus his extensive atherosclerotic dis/ ectatic thorAo/ aneurysm/ etc; his breathing is stable- mild cough, min sput, no hemoptysis, stable DOE, etc...     HBP, CHF, AFib w/ hx clot in LA appendage- resolved on Coumadin> on ASA81, Coumadin, Aten25 (if BP>110), Losartan100, Lasix40; BP= 120/64 & he denies CP, palpit etc; followed by Ryan Hensenfor Cards- his notes are reviewed, stable- no changes made...    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 Ryan Hess note indicates that to finish the repair he will require 3 branches and a left renal fenestration, they quoted him a risk of paraplegia in the 10-15% range; [facility] stents placed 04/15/13 in a >4h operation w/ special spinal precautions- he developed a blood clot in his left arm w/ some persist swelling; [facility] doing well; BNP=184...     Chol> on Lip10; last FLP 12/12 showed TChol 107, TG 38, HDL 40, LDL 60; we discussed return for f/u FLP...    Hypothyroid> we have been following his TSH levels and he has been consistently 6-7 range; we decided to start SYNTHROID540m/d in 2014 and f/u TSH 5/15 on Levothy50 = 1.92    GI- Divertics, Polyps, Hx ischemic bowel> the latter was due to an internal hernia & closed loop obstruction & not from any thrombotic or embolic phenomenon; he has done well since the surgery 7/13...    GU- BPH, hx prostate cancer, Renal insuffic> on Flomax0.4; followed by  Ryan Hess & he elected XRT- admin by Ryan Hess in 2009; he has a slowly rising PSA w/ a doubling time of 6-67mo he is considerinjg androgen ablation if necessary, Cr=1.6..Marland KitchenMarland Kitchen   DJD> on Oxy-IR 561mBid from Pain management; he is s/p bilat TKRs by Ryan Hess...    Hx Anemia> prev on Fe; Hg increased from 9.5 post op to 13.2 w/ Fe=70 (21% sat) in Oct2013... f/u labs 5/15 showed Hg=13.2 We reviewed prob list, meds, xrays and labs> see below for updates >>   LABS 5/15:  Chems- ok x BUN=26 Cr=1.6;  CBC- wnl w/ Hg=13.2;  TSH=1.92 on Levothy50;  BNP=184...   ~  April 08, 2014:  85m385moV & add-on appt requested for swelling in left leg> pt reports that he fell  going to the bathroom ~2wks ago w/ bruise on left brow, left hand & left leg; the bruising has since resolved but son noticed that left leg is red in the skin & leg is swollen to twice the size of the right leg, sl tender, not warm/hot, no open areas or drainage;  He has chronic venous insuffic & some mild edema but the redness is new & the L>R leg swelling is new;  He is on chr coumadin rx due to his AFib & [facility] COUMADIN 2.5mg73mbs- taking 1x5d=TWThSS and 1/2 x2d=MF; last Protime was 03/06/14 w/ INR=2.6, kept the same & asked to f/u 04/17/14... We reviewed prob list, meds, xrays and labs> see below for updates >>   LABS 7/15:  Chems- ok x BUN=33 Cr=2.0;  CBC- ok w/ Hg=12.2 WBC=6.9;  Protime=49sec, INR=4.8;  Sed=37  D-Dimer=2.50  CRP=4.4  VenDopplers 7/15 showed NEG for DVT... REC> NO COUMADIN TODAY, we will call result to the CC for med adjustment & f/u; Rec incr water, no salt, elev legs & continue Lasix20; if swelling persists he [facility] ~  August 14, 2014:  51mo 885mo& Ryan Hess reports stable, no new complaints over the interval, but his son reports more forgetful- they are not yet ready to consider [name] rx... Last OV 7/15 he had left leg swelling, VenDopplers were neg for DVT & he was already on Coumadin due to his AFib; he has elim sodium, elev legs, wearing support hose & takingthe Lasix20- in the interval his swelling is gone & he is back to baseline (this is despite a 12# wt gain , eating better & he looks better... We reviewed the following medical problems during today's office visit >>     COPD, HxPneumonia> on Advair250, Spiriva, Mucinex; last CT 12/12 w/ severe emphysema & scarring, plus his extensive atherosclerotic dis/ ectatic thorAo/ aneurysm/ etc; his breathing is stable- mild cough, min sput, no hemoptysis, stable DOE, etc...     HBP, CHF, AFib w/ hx clot in LA appendage- resolved on Coumadin> on ASA81, Coumadin, Losartan50,  Lasix40; BP= 130/72 & he denies CP, palpit etc; followed by DrNisCherly HensenCards- last seen 12/14, stable- no changes made...    Peripheral Vasc Dis> on ASA81 & Coumadin; [facility] by DrLawHungry Horsehad a Thoracoabd Ao Aneurysm from the subclavian down to the prev Ao-bi-iliac graft, they decided on a fenestrated endovascular repair & a staged procedure- 4/13 they did the Thoracic endovasc aneurysm repair & he developed mult post op complications (see 6/28/4/58/59 note)... Ryan Hess note indicates that to finish the repair he  will require 3 branches and a left renal fenestration, they quoted him a risk of paraplegia in the 10-15% range; s/p surg at [facility] by Ryan Hess w/ 5 stents placed 04/15/13 in a >4h operation w/ special spinal precautions- he developed a blood clot in his left arm w/ some persist swelling; he has reg f/u at [facility], seen 5/15 & doing well; BNP=184...     Chol> on Lip10; last FLP 12/12 showed TChol 107, TG 38, HDL 40, LDL 60; we discussed return for f/u FLP![name]..    Hypothyroid> we have been following his TSH levels and he has been consistently 6-7 range; we decided to start SYNTHROID33mg/d in 2014 and f/u TSH 5/15 on Levothy50 = 1.92    GI- Divertics, Polyps, Hx ischemic bowel> the latter was due to an internal hernia & closed loop obstruction & not from any thrombotic or embolic phenomenon; he has done well since the surgery 7/13...    GU- BPH, hx prostate cancer, Renal insuffic> on Flomax0.4; followed by Ryan Hess & he elected XRT- admin by Ryan Hess in 2009; he had a slowly rising PSA w/ a doubling time of ~16yrhe is considering restaging & androgen ablation if necessary, Cr=1.6-2.0 range...    DJD> he is s/p bilat TKRs by Ryan Hess; on Tramadol50 & Tylenol as needed...    Hx Anemia> prev on Fe; Hg increased from 9.5 post op to 13.2 w/ Fe=70 (21% sat) in Oct2013... f/u labs 5/15 showed Hg=13.2 We reviewed prob list, meds, xrays and labs> see below for  updates >> OK 2015 Flu vaccine today...  ~  December 14, 2014:  61m52moV & GeoQuinnland a [facility] and his [place]> see details below but everything stable and no med changes required:  CXR 1/16 revealed stable thoracoabdominal aortic endograft- no change, stable heart size, COPD/Emphysema & mild right basilar atelectasis, NAD...  Abd XRay 1/16 showed 4-vessel fenestrated thoracoabdominal aortic endograft w/ branch stents in celiac/ SMA/ & bilat renal arts- all unchanged, extensive DDD in spine, L4 compression- all unchanged...   Renal/ Mesenteric Ultrasound 1/16> no evid of hemodynamically signif RAS noted bilat, right renal cyst, distal SMA stent/ mid & distal SMA were normal, celiac & prox SMA stent not visualized...   CT Chest/ Abd/ Pelvis 1/16 w/o contrast> stable to sl decr in size of the mid thoracic Ao aneurysm, stable appearance of the 4-vessel fenestrated thoracoabdominal endograft; thickening & trabeculation of the bladder wall & prox ascending bowel wall thickening... NOTE> they rec f/u w/ Urology & GI in light of these findings... Ryan Hess was very [facility]/ [facility]> he states he is feeling well, now 89 41o, and denies CP, palpit, dizzy, change is dyspnea, & no edema; he is able to walk to the mailbox slowly & does ok- this is stable; he notes min cough/ beige sput/ no hemoptysis or f/c/s; he notes that he is a little more forgetful & son confirms some progressive decline in memory- they are ready to try [name] Rx (start w/ 5mg53m7 incr to 10mg48m..     COPD, HxPneumonia> on Advair250Bid & Spiriva daily, plus Mucinex600Bid; denies resp exac- continue same, avoid infections...    Cards- HBP, CHF, AFib> on Coumadin via CC, ASA81, Losar100, Lasix40; BP= 122/60 & they note that Ryan Hess doesn't want it too low due to his atherosclerosis & the endograft; he is overdue for Cards follow up visit...Marland KitchenMarland Kitchen  Peripheral  Vasc Dis> see above...    Hyperlipidemia> on Atorva10; FLP today showed TChol 133, TG 82, HDL 43, LDL 74... continue same Rx...    Hypothyroid> on Synthroid50; clinically euthyroid & Labs 3/16 showed TSH= 2.82...    GI- HxDivertics, Polyps, ischemic bowel> see above; CT Abd&Pelvis w/o contrast 1/16 at [facility] w/ focal thickening in bowel wall in Prox asc colon; he saw DrDBrodie last 2012 & they decided to forgo any f/y colonoscopies for the obvious reasons; we will follow clinically- watch blood counts and stools, Ryan Hess plans f/u CT scans in another 5mo..     GU- prostate cancer and RI> he is followed by Ryan Hess for Urology & seen several weeks ago by his hx (we do not have notes from them); pt reports everything was good & I [facility] & scan to Urologist's attn...    DJD> GCrosbyappears to be stable, s/p bilat TKRs, on Tramadol & tylenol prn...    Anemia> Labs today showed Hg= 14.4, MCV= 94, all back to wnl... We reviewed prob list, meds, xrays and labs> see below for updates >>   LABS 3/16:  FLP- at goals on Atorva10;  Chems- ok w/ Cr improved to 1.85;  CBC- wnl;  TSH=2.82 on Synthroid50... PLAN>>  We decided to start [name] 5=>159md for his vascular dementia & memory loss;  He will f/u w/ Cards at his earliest convenience; we will [facility] & scan report to Ryan Hess, Urology to review...   ~  June 16, 2015:  63m54moV & GeoJustunports that he is doing well- feeling good, min cough, no sput, no blood, stable SOB/DOE w/o change; he ambulates w/ a cane & needs more exercise, we discussed this- not having any CP, palpit, edema...  [facility] w/ studies below-- no changes made & they are continuing 63mo 51mo; they did rec f/u by Urology due to CT scan findings and he has an upcoming appt w/ Ryan Hess...   CXR 8/16> similar appearance to the thoracoabd endograft, lungs appear unchanged...   Abd film 8/16> similar appearance of the endograft w/o kink/  stenosis/ or other complic evident; degen changes in the spine...  Renal/ Mesenteric Ultrasound 8/16> no evid of renal art stenosis bilat w/ patent stents; norm hepatic & mesenteric art findings w/ patent SMA stent  CTA Chest/ Abd/ Pelvis 8/16> no change in appearance of the Aortic fenestrated endograft spaning the desc aorta to the infrarenal AA w/ stent limbs to the celiac art, SMA, bilat renal arts; 2 areas of aneurysmal dilatation- mid-thor sac measures 5.8cm (prev 5.5cm), & upper abd aneurysm sac measures 7.2cm (prev 7.5cm); apical scarring & severe centrilob & paraseptal emphysema, 1cm RUL nodule unchanged, no adenopathy; right renal cyst & irreg enhancing post bladder wall thickening which is new; radiotherapy seeds in prostate; esoph is patulous w/ A/F level distally; DJD & L4 compression w/o change... COPY TO PT TO SHOW TO Dr. WrennJeffie PollockLABS [facility]:  BUN=31, Cr=1.87 EXAM shows Afeb, VSS, O2sat=97% on RA;  HEENT- neg, mallampati1;  Chest- sl decr BS at bases w/ scat rhonchi, no w/r/consolidation;  Heart- irreg AFib, Gr1/6SEM w/o r/g;  Abd- soft, nontender;  Ext- w/o c/c/e;  Neuro- intact...    COPD, HxPneumonia> on Advair250Bid & Spiriva daily, plus Mucinex600Bid; denies resp exac- continue same, avoid infections...    Cards- HBP, CHF, AFib> on Coumadin via CC, ASA81, Losar50, Lasix40; BP= 116/50 & they note that  Ryan Hess doesn't want it too low due to his atherosclerosis & the endograft; he is overdue for Cards follow up visit...     Peripheral Vasc Dis> see above per Ryan [facility] at [facility]...    Hyperlipidemia> on Atorva10; FLP 3/16 showed TChol 133, TG 82, HDL 43, LDL 74... continue same Rx...    Hypothyroid> on Synthroid50; clinically euthyroid & Labs 3/16 showed TSH= 2.82...    GI- HxDivertics, Polyps, ischemic bowel> see above; CTA Abd&Pelvis 8/[facility] at [facility] looked ok; he saw DrDBrodie last 2012 & they decided to forgo any f/u colonoscopies; we will follow clinically- watch blood counts and  stools...    GU- prostate cancer and RI> he is followed by Ryan Hess for Urology & he has up coming appt to address the abn finding on CTA Abd/Pelvis w/ post bladder wall thickening...    DJD> Marteze appears to be stable, s/p bilat TKRs, on Tramadol & Tylenol prn...    Anemia> Labs 3/16 showed Hg= 14.4, MCV= 94, all back to wnl...    Memory Loss> we had prev started [name]5=>10 but pt has stopped this med & they do not want to pursue this eval or Rx... IMP/PLAN>>  We sent a copy of his CT Chest/ Abd/ Pelvis from [facility] Everywhere report) with the pt to give to Ryan Hess; he will continue current meds and incr exercise; overdue for Cards f/u & they will call; Given 2016 FLU shot today...           Problem List:  COPD (ICD-496) Hx of PNEUMONIA, ORGANISM UNSPECIFIED (ICD-486) PULMONARY NODULE & CAVITY RLL (ICD-518.89) - he is an ex-smoker, having quit in [place] after 40 yrs of smoking... he was exercising regularly and walking daily ~30m 5-6 days per week... on ASublettedaily, +MUCINEX 2Bid w/ Fluids... ~  baseline CXR & CTChest w/ marked emphysema, atheromatous calcif, 5cm desc thorAA...  ~  CXR & CTA 2/11 in hosp showed cardiomeg + coronary calcif, advanced atherosclerosis of Ao w/ aneuryms 5.8cm into abd, underlying emphysema w/ interst edema & bilat effusions, no PE... ~  4/11:  RLL pneumonia which was slow to clear... ~  6/11:  f/u CXR & CT Chest 6/11 w/ 6cm cavitary area RLL & nodular component inferiorly, +hilar adenopathy, no mediastinal nodes, etc; Tumor markers showed CEA=7.5 & Ca19-9= 10.8; > IR felt lesion was too hi risk for bx, therefore contin Rx & observe. ~  8/11:  f/u CXR w/o change in RLL opac (no worsening)>> repeat CEA=6.5; plan f/u CT Chest in 255mo~  10/11: f/u CT Chest showed interval decrease in size of RLL cavitary lesion & the inferiorly placed soft tissue component; otherw there is severe emphysema, cardiomeg, coronary calcif, thoracoabd aneurysm w/o change;  there was an air-fluid level in a dilated esoph> check Ba Esophagram= Nonspecific esophageal motility disorder with very poor primary esophageal contractions (no HH, stricture, etc)... ~  12/12: f/u CTChest showed severe emphysema & scarring w/ bibasilar atx (no infiltrates no cavities), markedly tortuous & ectaticThorAo w/ extensive atherosclerotic changes & no change in decr thor aneurysm measuring 5.9 x 5.6cm, mild cardiomeg & dense coronary calcif as well... ~  4/13:  He had a fenestrated endovasc [facility] ~  subseq CXRs thru 7/13 hosp w/ cardiomeg, low lung vols, bibasilar airsp dis, etc... ~  CXR 1/14 showed cardiomeg, bibasilar fibrosis, thoracic aortic stent noted, NAD...[name]~  7/14:  ?RUL pneumonia treated w/ Avelox & resolved=> pt had his  planned vasc surg 04/15/13 at [facility] ~  CXR 10/14 showed chr changes of Ao stent graft, stable cardiomegaly, no infiltrates, NAD.[name]. ~  5/15: on Advair250, Spiriva, Mucinex; last CT 12/12 w/ severe emphysema & scarring, plus his extensive atherosclerotic dis/ ectatic thorAo/ aneurysm/ etc; his breathing is stable- mild cough, min sput, no hemoptysis, stable DOE, etc. ~  11/15: stable on same meds... ~  CXR 1/16 at [facility] showed stable thoracoabdominal aortic endograft- no change, stable heart size, COPD/Emphysema & mild right basilar atelectasis, NAD.Marland KitchenMarland Kitchen ~  CT Chest (along w/ Abd&Pelvis) 1/16 at [facility] showed stable to sl decr in size of the mid thoracic Ao aneurysm, stable appearance of the 4-vessel fenestrated thoracoabdominal endograft... ~  3/16: on Advair250Bid & Spiriva daily, plus Mucinex600Bid; denies resp exac; he had thorough eval at [facility] 1/16- stable & no changes made...    HYPERTENSION (ICD-401.9)                         << followed by Cherly Hess for Cards >> CONGESTIVE HEART FAILURE (ICD-428.0) ATRIAL FIBRILLATION (ICD-427.31) w/ clot in left atrial appendage >> resolved on f/u TEE, on COUMADIN per Cards &  cardioverted. PREMATURE VENTRICULAR CONTRACTIONS, FREQUENT (ICD-427.69)  MEDS> prev on ATENOLOL 31m/d, & LASIX 269m2AM + KCl 1016mid, and off Losartan... prev Norvasc discontinued & hx ACE cough in the past on Lisinopril. ~  NuclearStressTest 1/05 was neg- no ischemia or infarct (+diaphrag attenuation), EF=56%... ~  repeat Nuclear study 6/09 was neg- no ischemia, mild inferoapic thinning, not gated due to PVCs... ~  2DEcho 6/09 showed mild dilated LV w/ EF= 45-50% but no regional wall motion abn, mild AoV calcif & AI, LA mild dil... ~  12/10:  [place] ~  2/11:  in hosp- Cath= mild nonobstructive 3 vessel CAD, mod LVD w/ EF=40-45%; TEE= CHF w/ EF~45% w/ HK & left atrial appendage clot & AFib; he was diuresed & Norvasc stopped, placed on Coumadin w/ careful f/u by DrNCherly Hensenthe CC... ~  Subseq [facility] [place] w/ PVCs... ~  Recurrent AFib w/ RVR after his Thoracoabdominal stent graft 4/13... ~  6/13:  BP was low assoc w/ GU bleeding & ATENOLOL25 & LOSARTAN50 placed on HOLD... ~  COUMADIN on HOLD since GU bleeding & acute blood loss anemia; it has not yet been restarted... ~  8/13:  BP= 116/78 on LASIX 40m65m& K10-Bid;  Wt down 14# to 173# ~  10/13:  BP= 114/82 on Lasix40+K1-Bid (he takes Aten25 if BP>110 at home)... ~  12/13: he had Cards f/u DrNishan> note reviewed, no change in meds, he referred him to DrGearhart for f/u stent as he did not [facility] to [facility] to [facility]~  5/14: HBP, CHF, AFib w/ hx clot in LA appendage- resolved on Coumadin> on ASA81, Aten25, Lasix40, K10Bid; BP= 114/72 & he denies CP, palpit etc; followed by DrNiCherly Hess Cards- his notes are reviewed, stable- no changes made. ~  8/14: he is post op [facility] by Ryan Hess... ~  5/15: on ASA81, Coumadin, Aten25 (if BP>110), Losartan100, Lasix40; BP= 120/64 & he denies CP, palpit etc; followed by DrNiCherly Hess Cards- his notes are reviewed, stable- BNP  5/15= 184... ~  7/15: on ASA81, Coumadin, Aten25 (if BP>110), Losartan100, Lasix40; BP= 110/60 & he presented w/ incr left leg swelling after fall; Renal function sl worse w/ Cr=2.0 & rec to decr Losartan100=>50 ~  11/15: on ASA81, Coumadin, Losar50, Lasix40; BP= 130/72 & he denies CP, palpit etc; followed by Cherly Hess for Cards- last seen 12/14, stable- no changes made & he is due for f/u visit...Marland KitchenMarland KitchenMarland Kitchen  PERIPHERAL VASCULAR DISEASE (ICD-443.9) - back on ASA 40m/d...  ~  he is s/p infrarenal AAA repair w/ right common iliac aneurysm repair (via Ao Bi-iliac graft) in 1998 by DrLawson; also has a known desc thor AA measuring ~5+cm and followed by DrGearhart...  ~  seen by DrGearhart 4/10 w/ CT scan showing ~5cm thoracoabdominal aneurysm w/o signif change...  ~  seen by DrGearhart 2/11 in hosp w/ sl incr size of AAA- f/u planned 669mo~  also followed by PV- DrCooper. ~  seen by DrGearhardt 10/11 & stable, no change, f/u 1 yr. ~  Seen 12/12 by DrGearhardt & CTscan is stable, continue conservative approach... ~  2/13:  [facility]... ~  4/13:  He had a fenestrated [facility]asc [facility] (on Coumadin) w/ paraplegia requiring readmission for laminectomy & evac of the hematomas;  [facility] w/ altered mental status, pneumonia, AFib w/ rvr, & FTT> Neuro w/u was otherw neg (& he improved w/ supportive care)... ~  2/14:  CT Chest, Abdomen, Pelvis> per Ryan Hess - see report (the abdominal component has enlarged)... 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 Ryan Hess note indicates that to finish the repair he will require 3 branches and a left renal fenestration, they quoted him a risk of paraplegia in the 10-15% range & he will need preadmission for a lumbar drain to be placed, etc; [facility] ~  [facility]14[facility] w/ 5 stents placed 04/15/13 in a >4h operation w/ special spinal precautions- he developed a blood clot in his left arm w/ some persist swelling; [facility] ~  He [facility] a [facility]/ [facility], CT scans, dopplers, etc on a regular basis- results reviewed in Care Everywhere section of Epic...  VENOUS INSUFFICIENCY, EDEMA >>  ~  He has chronic venous insuffic w/ mild chronic edema maintained on a low sodium diet, elevation, support hose when nec, & [id number]... ~  7/15:  Presented 2wks after fall at home, left leg red/ swollen/ sl tender, no broken skin/ drainage/ not hot etc; VenDopplers showed NEG for DVT & Protime was too thin... ~  11/15: edema resolved and back to baseline...  HYPERCHOLESTEROLEMIA (ICD-272.0) - on LIPITOR 30m13m..  ~  FLP [place]7 showed TChol 115, Tg 58, HDL 36, LDL 67 ~  FLP 5/08 showed TChol 118, TG 66, HDL 31, LDL 74 ~  FLP 5/09 showed TChol 126, TG 71, HDL 37, LDL 75 ~  FLP 6/10 showed TChol 116, TG 73, HDL 40, LDL 62 ~  2/11:  FLP not checked during the hospitalization... ~  FLP Celada1 in hosp showed TChol 94,  TG 52, HDL 23, LDL 61 ~  FLP 12/12 on Lip10 showed TChol 107, TG 38, HDL 40, LDL 60 ~  FLP 3/16 on Lip10 showed TChol 133, TG 82, HDL 43, LDL 74  HYPOTHYROIDISM >> he remains clinically euthyroid... ~  Labs 12/12 showed TSH= 6.10 ~  Labs 6/13 showed TSH= 6.66 ~  Labs 5/14 showed TSH= 6.21... We decided to start SYNTHROID 7mg/d... ~  Labs 5/15 on Levothy50 showed TSH= 1.92 ~  Labs 3/16 on Synthroid50 showed TSH=  2.82  DIVERTICULOSIS OF COLON (ICD-562.10) & COLONIC POLYPS (ICD-211.3) - hx polyps in 2003 = tubular adenoma... ~  last colonoscopy 9/06 showed divertics, hems, no polyps. ~  Pt followed by DrDBrodie & they decided to forgo any further GI procedures/ colonoscopies...  ISCHEMIC BOWEL >> GMillionpresented to the ER 7/23 - 04/25/12 w/ abd pain- ELap surg revealed ~5 feet of frankly ischemic small bowel, likely due to internal hernia and closed loop obstruction=> Bowel resected & pulses intact w/ no evidence of embolic or thrombotic pathology;  Viable small bowel anastomosed & wound vac applied; he had a stable post op course...  RENAL INSUFFICIENCY >>  PROSTATE CANCER (ICD-185) - eval by Ryan Hess and they decided on XRT by Ryan Hess- finished 5/09 and he states that Ryan Hess "released me"... he sees Ryan Hess every 6 months & they follow PSA closely (notes reviewed)... ~  8/12: f/u prostate cancer dx in 2009 & treated w/ XRT completed 6/09; slowly rising PSA w/ doubling time [id number] voiding symptoms, he is considering androgen ablation if needed... ~  6/13: He developed hematuria & urinary retention requiring Urology eval, discontinuation of Coumadin, & Foley placement=> subseq removed 8/13 w/ adeq voiding trial... ~  3/14:  He had f/u visit w/ Ryan Hess- PSA nadir after XRT was <0.04 and it has climbed to 0.54 (3/14) w/ PSADT of ~1y40yrhey chose to continue watchful waiting... ~  1/15:  Ryan Hess reports that PSA is up to 1.46 ~  Labs 5/15 on Lasix40 showed Cr=1.6, BNP=184 ~  7/15: he had f/u w/ Ryan Hess> BPH w/ BOO, Prostate ca; on Flomax0.4, PSA up to 1.84, neg bone scan x degen changes; they continue to follow w/ watchful waiting, ROV in 23mo19mo~  He maintains regular f/u w/ Ryan Hess> we do not have recent clinic notes but pt indicates that everthing is ok... ~  3/16: [facility] & reviewed in CareEtheteicates thickening & trabeculation of the bladder wall & prox ascending bowel  wall thickening... NOTE> they rec f/u w/ Urology & records sent in light of these findings... ~  Labs 3/16 showed BUN=32, Cr= 1.85...  DEGENERATIVE JOINT DISEASE (ICD-715.90) - s/p bilat TKR's, [facility]- Ryan Hess... He has OXY-IR 5mg 65mneeded for pain...  ACTINIC SKIN DAMAGE (ICD-692.70) - followed by DrHouBrunetta Jeansknows to avoid sun exposure, use sun screen, etc...  ANEMIA >> ~  Labs 6/13 showed Hg= 8.4, Fe= 31 (11%sat); Rec to start FeSO4 Bid... ~  Labs 7/13 in hosp showed Hg= 12.6=>8.2 at disch... ~  Labs 8/13 showed Hg= 9.5, MCV= 94; rec to continue FeSO4 Bid... ~  Labs 10/13 showed Hg= 13.2 & ok to wean Fe to 1/d til gone then stop... ~  Labs 5/14 showed Hg= 14.2 ~  Labs 5/15 showed Hg= 13.2 ~  Labs 3/16 showed Hg= 14.4   Past Surgical History  Procedure Laterality Date  . Total knee arthroplasty      bilateral  . Abdominal aortic  aneurysm repair  1998  . Appendectomy    . Inguinal hernia repair      left  . Joint replacement    . Laparotomy  04/16/2012    Procedure: EXPLORATORY LAPAROTOMY;  Surgeon: Madilyn Hook, DO;  Location: [place];  Service: General;  Laterality: N/A;  exploratory Laparotomy,Small bowel ressection abdominal wound vac placement.  . Laparotomy  04/18/2012    Procedure: EXPLORATORY LAPAROTOMY;  Surgeon: Joyice Faster. Cornett, MD;  Location: [place];  Service: General;  Laterality: N/A;  exploratory laparotomy with small bowel anastomosis  . Application of wound vac  04/18/2012    Procedure: APPLICATION OF WOUND VAC;  Surgeon: Joyice Faster. Cornett, MD;  Location: [place];  Service: General;  Laterality: N/A;  Removal of abdominal wound vac, Application of incisional  wound vac  . Repair of aa  04/14/2013    done at [place] Encounter Prescriptions as of 06/16/2015  Medication Sig  . aspirin 81 MG tablet Take 81 mg by mouth daily.   [name] atorvastatin (LIPITOR) 10 MG tablet Take 1 tablet (10 mg total) by mouth daily.  . cephALEXin (KEFLEX) 500 MG capsule  Take 500 mg by mouth daily.  . Fluticasone-Salmeterol (ADVAIR DISKUS) 250-50 MCG/DOSE AEPB inhale 1 dose by mouth twice a day  . furosemide (LASIX) 40 MG tablet TAKE 1 TABLET BY MOUTH EVERY DAY  . guaiFENesin (MUCINEX) 600 MG 12 hr tablet Take 600 mg by mouth 2 (two) times daily. COPD.  [name] ibuprofen (ADVIL,MOTRIN) 200 MG tablet Take 200 mg by mouth every 6 (six) hours as needed.  [name] levothyroxine (SYNTHROID, LEVOTHROID) 50 MCG tablet TAKE 1 TABLET BY MOUTH EVERY DAY  . losartan (COZAAR) 100 MG tablet TAKE 1 TABLET BY MOUTH EVERY DAY  . Multiple Vitamin (MULTIVITAMIN) tablet Take 1 tablet by mouth daily.    [name] SPIRIVA HANDIHALER 18 MCG inhalation capsule INHALE THE CONTENTS OF ONE CAPSULE BY MOUTH DAILY AS DIRECTED  . traMADol (ULTRAM) 50 MG tablet TAKE 1 TABLET BY MOUTH 3 TIMES A DAY AS NEEDED FOR PAIN  . warfarin (COUMADIN) 2.5 MG tablet TAKE AS DIRECTED BY ANTICOAGULATION CLINIC  . donepezil ([name]) 10 MG tablet Take 1/2 tablet by mouth once daily x 30 days then increase to 1 tablet by mouth once daily thereafter (Patient not taking: Reported on 06/16/2015)  . losartan (COZAAR) 50 MG tablet Take 50 mg by mouth daily.  . tamsulosin (FLOMAX) 0.4 MG CAPS capsule Take 1 capsule (0.4 mg total) by mouth at bedtime. (Patient not taking: Reported on 06/16/2015)  . tiotropium (SPIRIVA) 18 MCG inhalation capsule Place 1 capsule (18 mcg total) into inhaler and inhale daily.   No facility-administered encounter medications on file as of 06/16/2015.    No Known Allergies   Current Medications, Allergies, Past Medical History, Past Surgical History, Family History, and Social History were reviewed in [employer] record.   Review of Systems        See HPI - all other systems neg except as noted...  The patient complains of dyspnea on exertion.  The patient denies anorexia, fever, weight loss, weight gain, vision loss, decreased hearing, hoarseness, chest pain, syncope, peripheral  edema, prolonged cough, headaches, hemoptysis, abdominal pain, melena, hematochezia, severe indigestion/heartburn, hematuria, incontinence, muscle weakness, suspicious skin lesions, transient blindness, difficulty walking, depression, unusual weight change, abnormal bleeding, enlarged lymph nodes, and angioedema.     Objective:   Physical Exam    WD, WN, 81 y/o WM  in NAD... GENERAL:  Alert & oriented; pleasant & cooperative; pale complexion... HEENT:  [place]/AT, EOM-wnl, PERRLA, EACs-clear, TMs-wnl, NOSE-clear, THROAT-clear & wnl. NECK:  Supple w/ fairROM; no JVD; normal carotid impulses w/o bruits; no thyromegaly or nodules palpated; no lymphadenopathy. CHEST:  decr BS bilat, scat bibasilar rales, w/o wheezing/ rhonchi/ signs of consolidation. HEART:  irregular rhythm, gr1/6 diast murmur in Ao area, without rubs or gallops detected... ABDOMEN:  Soft & nontender; normal bowel sounds; no organomegaly or masses palpated... EXT: s/p bilat TKR's, mod arthritic changes; +venous insuffic, left leg swelling/ red > right leg, no drainage NEURO:  CN's intact;  gait abn; no focal neuro deficits... DERM:  No lesions noted; no rash etc...  RADIOLOGY DATA:  Reviewed in the EPIC EMR & discussed w/ the patient...  LABORATORY DATA:  Reviewed in the EPIC EMR & discussed w/ the patient...   Assessment:      COPD/Emphysema, Hx Pneumonia>  baseline severe COPD/Emphysema w/ Cardiomeg, Aneurysm & bibasilar fibrosis, stable on Advair250, Spiriva, Mucinex...  HBP>  HBP, CHF, AFib> on Coumadin via CC, ASA81, Losar50, Lasix40; BP= 120/60 range & they note that Ryan Hess doesn't want it too low due to his atherosclerosis & the endograft; he is overdue for Cards follow up visit...  CAD/ CHF/ etc>  Followed by Cherly Hess & his prev notes are reviewed;  BNP 12/14 was up to 4200 & on Lasix40 it has improved to 184 by 5/15...  AFib>  On above + Coumadin followed in the CC...  Periph Vasc Dis>  S/p AAA repair 1998 by  Sheryn Bison; then followed by DrGearhardt for thoraco-abd aneurysm & referred to [facility] for stent graft surg- done 4/13 by Ryan Hess w/ complications, and 2nd stage of the surgery 7/14 w/ 5 stents placed per Ryan Hess... They continue to follow regularly w/ CT scans every 60mo..  Ven Insuffic & Edema>  Prev improved w/ Lasix40/d;  He knows to elim salt, elev legs, wear support hose, etc;  He fell 7/15 w/ bruise reported, then followed by redness in skin & incr left leg swelling=> VenDoppler is neg for DVT & Protime was too thin=> adjusted; swelling resolved over time & back to baseline...  CHOL>  FLP looks good on Lip10...  Subclinical Hypothyroid>  TSH was 6.21 and Synthroid50 started 5/14=> TSH improved to 2.82...  GI> Presbyesoph, Divertics, Hx polpyps>  Stable & followed by DrDBrodie... 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  Prostate Cancer>  Followed by Ryan Hess  w/ slowly rising PSA as noted; then developed urinary retention after lumbar surg and hematuria after foley & coumadin; now back to baseline & being followed... 9/16> [facility] to take to f/u appt w/ Ryan Hess...  DJD>  Followed by Ryan Hess, s/p bilat TKRs...  ANEMIA>  He developed acute blood loss anemia w/ post op Hg= 11-12 but dropped to 8.6 w/ hematuria & INR=4.2; Coumadin & ASA placed on HOLD; f/u labs showed Hg=8.4 & Fe=31 & started on FeSO4 Bid;  Then Hg=9.5 & rec to continue Fe Bid;  Cards restarted his Coumadin;  Now Hg= 14.4   Actinic skin changes>  Aware, he is monitored by Derm...     Plan:     Patient's Medications  New Prescriptions   No medications on file  Previous Medications   ASPIRIN 81 MG TABLET    Take  81 mg by mouth daily.    ATORVASTATIN (LIPITOR) 10 MG TABLET    Take 1 tablet (10 mg total) by mouth daily.   CEPHALEXIN (KEFLEX) 500 MG CAPSULE    Take  500 mg by mouth daily.   DONEPEZIL ([name]) 10 MG TABLET    Take 1/2 tablet by mouth once daily x 30 days then increase to 1 tablet by mouth once daily thereafter   FLUTICASONE-SALMETEROL (ADVAIR DISKUS) 250-50 MCG/DOSE AEPB    inhale 1 dose by mouth twice a day   FUROSEMIDE (LASIX) 40 MG TABLET    TAKE 1 TABLET BY MOUTH EVERY DAY   GUAIFENESIN (MUCINEX) 600 MG 12 HR TABLET    Take 600 mg by mouth 2 (two) times daily. COPD.   IBUPROFEN (ADVIL,MOTRIN) 200 MG TABLET    Take 200 mg by mouth every 6 (six) hours as needed.   LEVOTHYROXINE (SYNTHROID, LEVOTHROID) 50 MCG TABLET    TAKE 1 TABLET BY MOUTH EVERY DAY   LOSARTAN (COZAAR) 100 MG TABLET    TAKE 1 TABLET BY MOUTH EVERY DAY   LOSARTAN (COZAAR) 50 MG TABLET    Take 50 mg by mouth daily.   MULTIPLE VITAMIN (MULTIVITAMIN) TABLET    Take 1 tablet by mouth daily.     SPIRIVA HANDIHALER 18 MCG INHALATION CAPSULE    INHALE THE CONTENTS OF ONE CAPSULE BY MOUTH DAILY AS DIRECTED   TAMSULOSIN (FLOMAX) 0.4 MG CAPS CAPSULE    Take 1 capsule (0.4 mg total) by mouth at bedtime.   TIOTROPIUM (SPIRIVA) 18 MCG INHALATION CAPSULE    Place 1 capsule (18 mcg total) into inhaler and inhale daily.   TRAMADOL (ULTRAM) 50 MG TABLET    TAKE 1 TABLET BY MOUTH 3 TIMES A DAY AS NEEDED FOR PAIN   WARFARIN (COUMADIN) 2.5 MG TABLET    TAKE AS DIRECTED BY ANTICOAGULATION CLINIC  Modified Medications   No medications on file  Discontinued Medications   No medications on file

## 2015-06-16 NOTE — Patient Instructions (Signed)
Today we updated your med list in our EPIC system...    Continue your current medications the same...  Try to slowly increase your exercise program...  You should plan a follow up w/ CARDIOLOGY in 3-4 months...  Call for any questions or if we can be of service in any way...  Let's plan a follow up visit in 28mo, sooner if needed for problems.Marland KitchenMarland Kitchen

## 2015-06-28 ENCOUNTER — Ambulatory Visit (INDEPENDENT_AMBULATORY_CARE_PROVIDER_SITE_OTHER): Payer: Medicare Other | Admitting: *Deleted

## 2015-06-28 DIAGNOSIS — I4891 Unspecified atrial fibrillation: Secondary | ICD-10-CM

## 2015-06-28 LAB — POCT INR: INR: 3.5

## 2015-07-14 ENCOUNTER — Ambulatory Visit (INDEPENDENT_AMBULATORY_CARE_PROVIDER_SITE_OTHER): Payer: Medicare Other | Admitting: *Deleted

## 2015-07-14 DIAGNOSIS — I4891 Unspecified atrial fibrillation: Secondary | ICD-10-CM | POA: Diagnosis not present

## 2015-07-14 LAB — POCT INR: INR: 2

## 2015-08-10 ENCOUNTER — Ambulatory Visit (INDEPENDENT_AMBULATORY_CARE_PROVIDER_SITE_OTHER): Payer: Medicare Other | Admitting: *Deleted

## 2015-08-10 DIAGNOSIS — I4891 Unspecified atrial fibrillation: Secondary | ICD-10-CM | POA: Diagnosis not present

## 2015-08-10 LAB — POCT INR: INR: 2.6

## 2015-09-01 ENCOUNTER — Telehealth: Payer: Self-pay | Admitting: Pulmonary Disease

## 2015-09-01 NOTE — Telephone Encounter (Signed)
Pt last had refill on tramadol 03/2014. #90 x 1 refills TAKE 1 TABLET BY MOUTH 3 TIMES A DAY AS NEEDED FOR PAIN  Please advise SN if okay to refill? thanks

## 2015-09-02 ENCOUNTER — Other Ambulatory Visit: Payer: Self-pay | Admitting: Cardiovascular Disease

## 2015-09-02 NOTE — Telephone Encounter (Signed)
Per SN: okay to refill x 3 refills thanks

## 2015-09-02 NOTE — Telephone Encounter (Signed)
Called and spoke with patient's son. Verified pharmacy as [facility] [address]. Patient's son voiced understanding and had no further questions. Rx was called into pharmacy. Nothing further needed.

## 2015-09-08 ENCOUNTER — Ambulatory Visit (INDEPENDENT_AMBULATORY_CARE_PROVIDER_SITE_OTHER): Payer: Medicare Other | Admitting: *Deleted

## 2015-09-08 DIAGNOSIS — I4891 Unspecified atrial fibrillation: Secondary | ICD-10-CM | POA: Diagnosis not present

## 2015-09-08 LAB — POCT INR: INR: 2.5

## 2015-09-17 ENCOUNTER — Other Ambulatory Visit: Payer: Self-pay | Admitting: Pulmonary Disease

## 2015-10-21 ENCOUNTER — Other Ambulatory Visit: Payer: Self-pay | Admitting: Pulmonary Disease

## 2015-10-26 ENCOUNTER — Ambulatory Visit (INDEPENDENT_AMBULATORY_CARE_PROVIDER_SITE_OTHER): Payer: Medicare Other | Admitting: Pharmacist

## 2015-10-26 DIAGNOSIS — I4891 Unspecified atrial fibrillation: Secondary | ICD-10-CM

## 2015-10-26 LAB — POCT INR: INR: 4.6

## 2015-11-05 ENCOUNTER — Ambulatory Visit (INDEPENDENT_AMBULATORY_CARE_PROVIDER_SITE_OTHER): Payer: Medicare Other | Admitting: *Deleted

## 2015-11-05 DIAGNOSIS — I4891 Unspecified atrial fibrillation: Secondary | ICD-10-CM

## 2015-11-05 LAB — POCT INR: INR: 2.3

## 2015-11-10 NOTE — Progress Notes (Signed)
Patient ID: Ryan Hess, male   DOB: 1924/03/20, 82 y.o.   MRN: [id number]   Stich returns today for followup. . He has a history of aortic aneurysm repair by Dr. Servando Snare. He has residual  descending thoracic aneurysm that was stented at [facility] in Ap[place]  2013  After stent he subsequently had ischemic bowel which required emergent surgery with secondary closure. Back on coumadin Reviewed INR;s and they have been good at 2.2-2.6 With no bleeding issues   Also  complicated by cord edema and he had to have a lumbar procedure. He has hypertension. The last time I saw him had a bit of a cough and we stopped his lisinopril and switched him to Atacand. His cough seems to be improved. He is not having any chest pain, PND, or orthopnea. He is active. He walks on a regular basis. He tends towards bradycardia and has had occasional PVCs. Chronic afib on coumadin with no bleeding problems He had a Myoview study done March 24, 2008, which was nonischemic. EF was not calculated due to PVCs. His EF by echo on March 24, 2008, was 45-50%. We had him on low-dose atenolol and this seems to have settled him down.   Seen in ER 12/15 for right sided chest pain ? Pleurisy  Given zpack  And anti inflammatory Lasix helping with LE edema .    Recent UTI that set him back a bit Unsteady on feet using cane  Sees Dr Lenna Gilford for lungs no active wheezing    ROS: Denies fever, malais, weight loss, blurry vision, decreased visual acuity, cough, sputum, SOB, hemoptysis, pleuritic pain, palpitaitons, heartburn, abdominal pain, melena, lower extremity edema, claudication, or rash.  All other systems reviewed and negative  General: Affect appropriate Frail elderly male  HEENT: normal Neck supple with no adenopathy JVP normal no bruits no thyromegaly Lungs poor air movement no wheezing and good diaphragmatic motion Heart:  S1/S2 SEM  murmur, no rub, gallop or click PMI normal Abdomen: benighn, BS positve, no tenderness, no AAA no  bruit.  No HSM or HJR Distal pulses intact with no bruits Plus one bilateral edema Neuro non-focal Skin warm and dry No muscular weakness   Current Outpatient Prescriptions  Medication Sig Dispense Refill  . aspirin 81 MG tablet Take 81 mg by mouth daily.     [name] atorvastatin (LIPITOR) 10 MG tablet TAKE 1 TABLET BY MOUTH DAILY 30 tablet 5  . cephALEXin (KEFLEX) 500 MG capsule Take 500 mg by mouth daily.    . Fluticasone-Salmeterol (ADVAIR DISKUS) 250-50 MCG/DOSE AEPB inhale 1 dose by mouth twice a day 180 each 3  . furosemide (LASIX) 40 MG tablet TAKE 1 TABLET BY MOUTH EVERY DAY 90 tablet 2  . guaiFENesin (MUCINEX) 600 MG 12 hr tablet Take 600 mg by mouth 2 (two) times daily. COPD.    [name] ibuprofen (ADVIL,MOTRIN) 200 MG tablet Take 200 mg by mouth every 6 (six) hours as needed for fever, headache or moderate pain.     [name] levothyroxine (SYNTHROID, LEVOTHROID) 50 MCG tablet TAKE 1 TABLET BY MOUTH EVERY DAY 90 tablet 3  . losartan (COZAAR) 100 MG tablet Take 50 mg by mouth daily.    . Multiple Vitamin (MULTIVITAMIN) tablet Take 1 tablet by mouth daily.      [name] SPIRIVA HANDIHALER 18 MCG inhalation capsule USE 2 INHALATIONS FROM ONE CAPSULE DAILY AS DIRECTED 90 capsule 0  . traMADol (ULTRAM) 50 MG tablet TAKE 1 TABLET BY MOUTH 3 TIMES A  DAY AS NEEDED FOR PAIN 90 tablet 1  . warfarin (COUMADIN) 2.5 MG tablet TAKE AS DIRECTED BY ANTICOAGULATION CLINIC 30 tablet 3  . atenolol (TENORMIN) 25 MG tablet Take 1 tablet (25 mg total) by mouth daily. 90 tablet 3  . tiotropium (SPIRIVA) 18 MCG inhalation capsule Place 1 capsule (18 mcg total) into inhaler and inhale daily. 30 capsule 0  . [DISCONTINUED] pantoprazole (PROTONIX) 40 MG tablet Take 1 tablet (40 mg total) by mouth daily. 30 tablet 5   No current facility-administered medications for this visit.    Allergies  Review of patient's allergies indicates no known allergies.  Electrocardiogram:  12/15  AFib rate 100 PVC nonspecific ST/T wave changes   11/15/15  Afib rate 114 LAD PVC  Nonspecific ST/T wave changes   Assessment and Plan  Chronic afib: Atenolol d/c by ? Lenna Gilford  He has had issues with hypotension but needs some AV nodal drug to keep HR under control  cardizem adverse Interaction with lipitor will put back on low dose atenolol and see next available   Chest Pain: ? Pleurisy resolved observe  HTN:  Now tends towards hypotension all drugs stopped except Atenolol for rate control  Thyroid   . Lab Results  Component Value Date   TSH 2.82 12/14/2014    COPD:  No active wheezing should tolerate low dose atenolol  Chol   Lab Results  Component Value Date   LDLCALC 74 12/14/2014    Vascular:  Post stenting of thoracic aneurysm f/u [facility]

## 2015-11-15 ENCOUNTER — Ambulatory Visit (INDEPENDENT_AMBULATORY_CARE_PROVIDER_SITE_OTHER): Payer: Medicare Other | Admitting: *Deleted

## 2015-11-15 ENCOUNTER — Encounter: Payer: Self-pay | Admitting: Cardiovascular Disease

## 2015-11-15 ENCOUNTER — Ambulatory Visit (INDEPENDENT_AMBULATORY_CARE_PROVIDER_SITE_OTHER): Payer: Medicare Other | Admitting: Cardiovascular Disease

## 2015-11-15 VITALS — BP 110/60 | HR 123 | Ht 69.0 in | Wt 171.0 lb

## 2015-11-15 DIAGNOSIS — I4891 Unspecified atrial fibrillation: Secondary | ICD-10-CM | POA: Diagnosis not present

## 2015-11-15 LAB — POCT INR: INR: 2

## 2015-11-15 MED ORDER — ATENOLOL 25 MG PO TABS: 25.0000 mg | ORAL_TABLET | Freq: Every day | ORAL | Status: DC

## 2015-11-15 NOTE — Patient Instructions (Addendum)
Medication Instructions:  Your physician has recommended you make the following change in your medication:  1-START Atenolol 25 mg by mouth daily   Labwork: NONE  Testing/Procedures: NONE  Follow-Up: Your physician wants you to follow-up [id number] available with Dr. Johnsie Cancel.   If you need a refill on your cardiac medications before your next appointment, please call your pharmacy.

## 2015-11-18 ENCOUNTER — Other Ambulatory Visit: Payer: Self-pay | Admitting: Pulmonary Disease

## 2015-12-14 ENCOUNTER — Ambulatory Visit (INDEPENDENT_AMBULATORY_CARE_PROVIDER_SITE_OTHER)
Admission: RE | Admit: 2015-12-14 | Discharge: 2015-12-14 | Disposition: A | Discharge status: Home / Self Care | Payer: Medicare Other | Source: Ambulatory Visit | Attending: Pulmonary Disease | Admitting: Pulmonary Disease

## 2015-12-14 ENCOUNTER — Ambulatory Visit (INDEPENDENT_AMBULATORY_CARE_PROVIDER_SITE_OTHER): Payer: Medicare Other | Admitting: Pulmonary Disease

## 2015-12-14 ENCOUNTER — Encounter: Payer: Self-pay | Admitting: Pulmonary Disease

## 2015-12-14 ENCOUNTER — Other Ambulatory Visit (INDEPENDENT_AMBULATORY_CARE_PROVIDER_SITE_OTHER): Payer: Medicare Other

## 2015-12-14 VITALS — BP 112/70 | HR 60 | Temp 97.0°F | Ht 69.0 in | Wt 174.0 lb

## 2015-12-14 DIAGNOSIS — I10 Essential (primary) hypertension: Secondary | ICD-10-CM

## 2015-12-14 DIAGNOSIS — E039 Hypothyroidism, unspecified: Secondary | ICD-10-CM

## 2015-12-14 DIAGNOSIS — I5022 Chronic systolic (congestive) heart failure: Secondary | ICD-10-CM

## 2015-12-14 DIAGNOSIS — I739 Peripheral vascular disease, unspecified: Secondary | ICD-10-CM

## 2015-12-14 DIAGNOSIS — J449 Chronic obstructive pulmonary disease, unspecified: Secondary | ICD-10-CM

## 2015-12-14 DIAGNOSIS — E7800 Pure hypercholesterolemia, unspecified: Secondary | ICD-10-CM

## 2015-12-14 DIAGNOSIS — J4489 Other specified chronic obstructive pulmonary disease: Secondary | ICD-10-CM

## 2015-12-14 DIAGNOSIS — N289 Disorder of kidney and ureter, unspecified: Secondary | ICD-10-CM

## 2015-12-14 DIAGNOSIS — F411 Generalized anxiety disorder: Secondary | ICD-10-CM

## 2015-12-14 DIAGNOSIS — M150 Primary generalized (osteo)arthritis: Secondary | ICD-10-CM

## 2015-12-14 DIAGNOSIS — M159 Polyosteoarthritis, unspecified: Secondary | ICD-10-CM

## 2015-12-14 DIAGNOSIS — C61 Malignant neoplasm of prostate: Secondary | ICD-10-CM

## 2015-12-14 DIAGNOSIS — I481 Persistent atrial fibrillation: Secondary | ICD-10-CM | POA: Diagnosis not present

## 2015-12-14 DIAGNOSIS — D500 Iron deficiency anemia secondary to blood loss (chronic): Secondary | ICD-10-CM

## 2015-12-14 DIAGNOSIS — I4819 Other persistent atrial fibrillation: Secondary | ICD-10-CM

## 2015-12-14 LAB — CBC WITH DIFFERENTIAL/PLATELET
Basophils Absolute: 0.1 10*3/uL (ref 0.0–0.1)
Basophils Relative: 0.7 % (ref 0.0–3.0)
Eosinophils Absolute: 0.2 10*3/uL (ref 0.0–0.7)
Eosinophils Relative: 2.1 % (ref 0.0–5.0)
HCT: 44.1 % (ref 39.0–52.0)
Hemoglobin: 14.6 g/dL (ref 13.0–17.0)
Lymphocytes Relative: 14.4 % (ref 12.0–46.0)
Lymphs Abs: 1.7 10*3/uL (ref 0.7–4.0)
MCHC: 33.2 g/dL (ref 30.0–36.0)
MCV: 95.8 fl (ref 78.0–100.0)
Monocytes Absolute: 0.8 10*3/uL (ref 0.1–1.0)
Monocytes Relative: 7.1 % (ref 3.0–12.0)
Neutro Abs: 8.8 10*3/uL — ABNORMAL HIGH (ref 1.4–7.7)
Neutrophils Relative %: 75.7 % (ref 43.0–77.0)
Platelets: 186 10*3/uL (ref 150.0–400.0)
RBC: 4.6 Mil/uL (ref 4.22–5.81)
RDW: 16 % — ABNORMAL HIGH (ref 11.5–15.5)
WBC: 11.6 10*3/uL — ABNORMAL HIGH (ref 4.0–10.5)

## 2015-12-14 LAB — COMPREHENSIVE METABOLIC PANEL
ALT: 23 U/L (ref 0–53)
AST: 30 U/L (ref 0–37)
Albumin: 4 g/dL (ref 3.5–5.2)
Alkaline Phosphatase: 235 U/L — ABNORMAL HIGH (ref 39–117)
BUN: 32 mg/dL — ABNORMAL HIGH (ref 6–23)
CO2: 25 mEq/L (ref 19–32)
Calcium: 9.3 mg/dL (ref 8.4–10.5)
Chloride: 106 mEq/L (ref 96–112)
Creatinine, Ser: 2 mg/dL — ABNORMAL HIGH (ref 0.40–1.50)
GFR: 33.52 mL/min — ABNORMAL LOW (ref >60.00)
Glucose, Bld: 97 mg/dL (ref 70–99)
Potassium: 4.2 mEq/L (ref 3.5–5.1)
Sodium: 144 mEq/L (ref 135–145)
Total Bilirubin: 1.8 mg/dL — ABNORMAL HIGH (ref 0.2–1.2)
Total Protein: 7.2 g/dL (ref 6.0–8.3)

## 2015-12-14 LAB — LIPID PANEL
Cholesterol: 120 mg/dL (ref 0–200)
HDL: 36.2 mg/dL — ABNORMAL LOW (ref >39.00)
LDL Cholesterol: 65 mg/dL (ref 0–99)
NonHDL: 84.16
Total CHOL/HDL Ratio: 3
Triglycerides: 98 mg/dL (ref 0.0–149.0)
VLDL: 19.6 mg/dL (ref 0.0–40.0)

## 2015-12-14 LAB — BRAIN NATRIURETIC PEPTIDE: Pro B Natriuretic peptide (BNP): 462 pg/mL — ABNORMAL HIGH (ref 0.0–100.0)

## 2015-12-14 LAB — TSH: TSH: 6.98 u[IU]/mL — ABNORMAL HIGH (ref 0.35–4.50)

## 2015-12-14 NOTE — Patient Instructions (Signed)
Today we updated your med list in our EPIC system...    Continue your current medications the same...  Today we rechecked your CXR & FASTING blood work...    We will contact you w/ the results when available...   Continue treatment of the buttock sore w/ warm soak, cleansing as you are doing, pat dry & use gauze dressing to keep skin off of skin in the crack...'  Call for any questions...  Let's plan a follow up visit in 26mo, but call me regarding the buttock sore so we can monitor the progress/ resolution.Marland KitchenMarland Kitchen

## 2015-12-14 NOTE — Progress Notes (Signed)
Subjective:     Patient ID: Ryan Hess, male   DOB: Apr 16, 1924, 82 y.o.   MRN: [id number]  HPI 82 y/o WM here for a follow up visit... he has multiple medical problems as noted below...   ~  SEE PREV EPIC NOTES FOR OLDER DATA >>     5/14:  Ryan Hess is awaiting a call from DrFarber at [facility] about the next stage in his planned thoracoabd aneurysm repair- he had his situation reviewed by DrBrabham here & he rec f/u at [facility] per the original plan since this needs to be done endovascularly & is quite complicated...   LABS 5/14:  Chems- ok w/ Hess mild RI (Cr=1.5);  CBC- wnl w/ Hg=14.2;  TSH=6.21 (Synthroid50 started);  BNP=263;  Protimes adjusted by the CC...   Ryan Hess has had his f/u surg at [facility] by [employer] w/ 5 stents placed 04/15/13 in a >4h operation w/ special spinal precautions; he developed a blood clot in his left arm w/ some persist swelling...  CXR 10/14 showed chr changes of Ao stent graft, Hess cardiomegaly, no infiltrates, NAD...   LABS 10/14:  CBC- wnl;  Protimes followed in CC...    LABS 5/15:  Chems- ok x BUN=26 Cr=1.6;  CBC- wnl w/ Hg=13.2;  TSH=1.92 on Levothy50;  BNP=184...   LABS 7/15:  Chems- ok x BUN=33 Cr=2.0;  CBC- ok w/ Hg=12.2 WBC=6.9;  Protime=49sec, INR=4.8;  Sed=37  D-Dimer=2.50  CRP=4.4  VenDopplers 7/15 showed NEG for DVT...   ~  December 14, 2014:  67moROV & [name] a follow up [facility] and his [place] [place]> see details below but everything Hess and no med changes required:  CXR 1/16 revealed Hess thoracoabdominal aortic endograft- no change, Hess heart size, COPD/Emphysema & mild right basilar atelectasis, NAD...  Abd XRay 1/16 showed 4-vessel fenestrated thoracoabdominal aortic endograft w/ branch stents in celiac/ SMA/ & bilat renal arts- all unchanged, extensive DDD in spine, L4 compression- all unchanged...   Renal/ Mesenteric Ultrasound 1/16> no evid of hemodynamically signif RAS  noted bilat, right renal cyst, distal SMA stent/ mid & distal SMA were normal, celiac & prox SMA stent not visualized...   CT Chest/ Abd/ Pelvis 1/16 w/o contrast> Hess to sl decr in size of the mid thoracic Ao aneurysm, Hess appearance of the 4-vessel fenestrated thoracoabdominal endograft; thickening & trabeculation of the bladder wall & prox ascending bowel wall thickening... NOTE> they rec f/u w/ Urology & GI in light of these findings... Ryan Hess [facility] very [name] he states he is feeling well, now 82y/o, and denies CP, palpit, dizzy, change is dyspnea, & no edema; he is able to walk to the mailbox slowly & does ok- this is Hess; he notes min cough/ beige sput/ no hemoptysis or f/c/s; he notes that he is a little more forgetful & son confirms some progressive decline in memory- they are ready to try [name] Rx (start w/ 521md 7 incr to 1058m)...     COPD, HxPneumonia> on Advair250Bid & Spiriva daily, plus Mucinex600Bid; denies resp exac- continue same, avoid infections...    Cards- HBP, CHF, AFib> on Coumadin via CC, ASA81, Losar100, Lasix40; BP= 122/60 & they note that DrFarber doesn't want it too low due to his atherosclerosis & the endograft; he is overdue for Cards follow up visit...     Peripheral Vasc Dis> see above...    Hyperlipidemia> on Atorva10; FLP today  showed TChol 133, TG 82, HDL 43, LDL 74... continue same Rx...    Hypothyroid> on Synthroid50; clinically euthyroid & Labs 3/16 showed TSH= 2.82...    GI- HxDivertics, Polyps, ischemic bowel> see above; CT Abd&Pelvis w/o contrast 1/16 at [facility] w/ focal thickening in bowel wall in Prox asc colon; he saw DrDBrodie last 2012 & they decided to forgo any f/y colonoscopies for the obvious reasons; we will follow clinically- watch blood counts and stools, DrFarber plans f/u CT scans in another 93mo..     GU- prostate cancer and RI> he is followed by DrWrenn for Urology & seen several weeks ago by his hx (we do  not have notes from them); pt reports everything was good & I will [facility] & [facility] to Urologist's attn...    DJD> Ryan Hess, s/p bilat TKRs, on Tramadol & tylenol prn...    Anemia> Labs today showed Hg= 14.4, MCV= 94, all back to wnl... We reviewed prob list, meds, xrays and labs> see below for updates >>   LABS 3/16:  FLP- at goals on Atorva10;  Chems- ok w/ Cr improved to 1.85;  CBC- wnl;  TSH=2.82 on Synthroid50... PLAN>>  We decided to start [name] 5=>171md for his vascular dementia & memory loss;  He will f/u w/ Cards at his earliest convenience; we will [facility] & scan report to DrWrenn, Urology to review...   ~  June 16, 2015:  28m29moV & [name] that he is doing well- feeling good, min cough, no sput, no blood, Hess SOB/DOE w/o change; he ambulates w/ a cane & needs more exercise, we discussed this- not having any CP, palpit, edema...  [facility]-- no changes made & they are continuing 28mo 35mo; they did rec f/u by Urology due to CT scan findings and he has an upcoming appt w/ DrWrenn...   CXR 8/16> similar appearance to the thoracoabd endograft, lungs appear unchanged...   Abd film 8/16> similar appearance of the endograft w/o kink/ stenosis/ or other complic evident; degen changes in the spine...  Renal/ Mesenteric Ultrasound 8/16> no evid of renal art stenosis bilat w/ patent stents; norm hepatic & mesenteric art findings w/ patent SMA stent  CTA Chest/ Abd/ Pelvis 8/16> no change in appearance of the Aortic fenestrated endograft spaning the desc aorta to the infrarenal AA w/ stent limbs to the celiac art, SMA, bilat renal arts; 2 areas of aneurysmal dilatation- mid-thor sac measures 5.8cm (prev 5.5cm), & upper abd aneurysm sac measures 7.2cm (prev 7.5cm); apical scarring & severe centrilob & paraseptal emphysema, 1cm RUL nodule unchanged, no adenopathy; right renal cyst & irreg enhancing  post bladder wall thickening which is new; radiotherapy seeds in prostate; esoph is patulous w/ A/F level distally; DJD & L4 compression w/o change... COPY TO PT TO SHOW TO Dr. WrennJeffie PollockLABS at [facility]:  BUN=31, Cr=1.87 EXAM shows Afeb, VSS, O2sat=97% on RA;  HEENT- neg, mallampati1;  Chest- sl decr BS at bases w/ scat rhonchi, no w/r/consolidation;  Heart- irreg AFib, Gr1/6SEM w/o r/g;  Abd- soft, nontender;  Ext- w/o c/c/e;  Neuro- intact...    COPD, HxPneumonia> on Advair250Bid & Spiriva daily, plus Mucinex600Bid; denies resp exac- continue same, avoid infections...    Cards- HBP, CHF, AFib> on Coumadin via CC, ASA81, Losar50, Lasix40; BP= 116/50 & they note that DrFarber doesn't want it too low due to his atherosclerosis & the endograft;  he is overdue for Cards follow up visit...     Peripheral Vasc Dis> see [facility]...    Hyperlipidemia> on Atorva10; FLP 3/16 showed TChol 133, TG 82, HDL 43, LDL 74... continue same Rx...    Hypothyroid> on Synthroid50; clinically euthyroid & Labs 3/16 showed TSH= 2.82...    GI- HxDivertics, Polyps, ischemic bowel> see above; CT[facility]Pelvis 8/16 at [facility] looked ok; he saw DrDBrodie last 2012 & they decided to forgo any f/u colonoscopies; we will follow clinically- watch blood counts and stools...    GU- prostate cancer and RI> he is followed by DrWrenn for Urology & he has up coming appt to address the abn finding on CTA Abd/Pelvis w/ post bladder wall thickening...    DJD> Ryan Hess appears to be Hess, s/p bilat TKRs, on Tramadol & Tylenol prn...    Anemia> Labs 3/16 showed Hg= 14.4, MCV= 94, all back to wnl...    Memory Loss> we had prev started [name]5=>10 but pt has stopped this med & they do not want to pursue this eval or Rx... IMP/PLAN>>  We sent a copy of his CT Chest/ Abd/ Pelvis from [facility] Everywhere report) with the pt to give to DrWrenn; he will continue current meds and incr exercise; overdue for Cards f/u & they will call; Given  2016 FLU shot today...  ~  December 14, 2015:  62moROV & Ryan Hess reports that he is doing OK overall- he has developed a small skin ulser over his sacrum 7 we discussed avoiding pressure on this area (use donut), wash/ gauze/ vaseline or desitin/ etc, they will call if not resolving... He reports breathing is good- sl cough, sm amt yellow sput, no f/c/s, no CP & Hess SOB/DOE (no change...  EXAM shows Afeb, VSS, O2sat=97% on RA;  HEENT- neg, mallampati1;  Chest- sl decr BS at bases w/ scat rhonchi, no w/r/consolidation;  Heart- irreg AFib, Gr1/6SEM w/o r/g;  Abd- soft, nontender;  Ext- w/o c/c/e;  Neuro- intact...    COPD, HxPneumonia> on Advair250Bid & Spiriva daily, plus Mucinex600Bid; denies resp exac- continue same, avoid infections...    Cards- HBP, CHF, AFib> on Coumadin via CC, ASA81, Aten25, Losar25, Lasix40; BP= 112/70 & they note that DrFarber doesn't want it too low due to his atherosclerosis & the endograft; he saw DrNishan 11/15/15-- HBP, AAA repair, [facility] AFib on coumadin- he restarted Aten25 for nodal blockade..    Peripheral Vasc Dis> [facility](last seen in Care Everywhere 04/2015 w/ CTA chest/abd/pelvis- note reviewed)...    Hyperlipidemia> on Atorva10; FLP 3/17 showed TChol 120, TG 98, HDL 36, LDL 65... continue same Rx...    Hypothyroid> on Synthroid50; clinically euthyroid but Labs 3/17 showed TSH= 6.98 so we rec incr Synthroid 7173m/d...    GI- HxDivertics, Polyps, ischemic bowel> see above; [facility] he saw DrDBrodie last 2012 & they decided to forgo any f/u colonoscopies; we will follow clinically- watch blood counts and stools...    GU- prostate cancer, RI, chr cystitis> he is followed by DrWrenn for Urology, abn finding on CTA Abd/Pelvis w/ post bladder wall thickening, last seen 10/22/15- cysto 06/2015 w/ inflamm in bladder but no mass, cyto neg, he's had hematuria & prev on 73m71moppressive Keflex for chr cystitis,  they are following closely...    DJD> GeoAbhinavpears to be Hess, s/p bilat TKRs, LBP, etc; on Tramadol & Tylenol prn...Marland KitchenMarland Kitchen  Anemia> Labs 3/17 showed Hg= 14.6, MCV= 94, all back to wnl...    Memory Loss> we had prev started [name]5=>10 but pt has stopped this med & they do not want to pursue this eval or Rx...  EKG 11/15/15>  Chr AFib, rate114, LAD, PVC, NSSTTWA...  CXR 12/14/15>  Norm heart size, extensive Ao stent graft in place, coarse interstitial markings bilat- NAD= no infiltrate/ masses/ fluid/ etc...  LABS 12/14/15>  FLP-  At goals on Lip10;  Chems- ok x BUN=32, Cr=2.00, AlkPhos=235 & GGT=119 (7-51);  BNP=462 on Lasix40;  CBC- wnl w/ Hg=14.6, wbc=11.6;  TSH=6.98 IMP/PLAN>>  Ryan Hess is 82 y/o 7 remarkably Hess for all he's been through- rec to continue current meds, continue regular follow up visits, avoid infections, stay active, & he will call for any problems in the interval...           Problem List:  COPD (ICD-496) Hx of PNEUMONIA, ORGANISM UNSPECIFIED (ICD-486) PULMONARY NODULE & CAVITY RLL (ICD-518.89) - he is an ex-smoker, having quit in [place] after 40 yrs of smoking... he was exercising regularly and walking daily ~16m 5-6 days per week... on AFranklindaily, +MUCINEX 2Bid w/ Fluids... ~  baseline CXR & CTChest w/ marked emphysema, atheromatous calcif, 5cm desc thorAA...  ~  CXR & CTA 2/11 in hosp showed cardiomeg + coronary calcif, advanced atherosclerosis of Ao w/ aneuryms 5.8cm into abd, underlying emphysema w/ interst edema & bilat effusions, no PE... ~  4/11:  RLL pneumonia which was slow to clear... ~  6/11:  f/u CXR & CT Chest 6/11 w/ 6cm cavitary area RLL & nodular component inferiorly, +hilar adenopathy, no mediastinal nodes, etc; Tumor markers showed CEA=7.5 & Ca19-9= 10.8; > IR felt lesion was too hi risk for bx, therefore contin Rx & observe. ~  8/11:  f/u CXR w/o change in RLL opac (no worsening)>> repeat CEA=6.5; plan f/u CT Chest in 244mo~  10/11:  f/u CT Chest showed interval decrease in size of RLL cavitary lesion & the inferiorly placed soft tissue component; otherw there is severe emphysema, cardiomeg, coronary calcif, thoracoabd aneurysm w/o change; there was an air-fluid level in a dilated esoph> check Ba Esophagram= Nonspecific esophageal motility disorder with very poor primary esophageal contractions (no HH, stricture, etc)... ~  12/12: f/u CTChest showed severe emphysema & scarring w/ bibasilar atx (no infiltrates no cavities), markedly tortuous & ectaticThorAo w/ extensive atherosclerotic changes & no change in decr thor aneurysm measuring 5.9 x 5.6cm, mild cardiomeg & dense coronary calcif as well... ~  4/13:  He had a fenestrated endovasc [facility]abdominal [facility] ~  subseq CXRs thru 7/13 hosp w/ cardiomeg, low lung vols, bibasilar airsp dis, etc... ~  CXR 1/14 showed cardiomeg, bibasilar fibrosis, thoracic aortic stent noted, NAD...[name]~  7/14:  ?RUL pneumonia treated w/ Avelox & resolved=> [facility]  CXR 10/14 showed chr changes of Ao stent graft, Hess cardiomegaly, no infiltrates, NAD...[name]~  5/15: on Advair250, Spiriva, Mucinex; last CT 12/12 w/ severe emphysema & scarring, plus his extensive atherosclerotic dis/ ectatic thorAo/ aneurysm/ etc; his breathing is Hess- mild cough, min sput, no hemoptysis, Hess DOE, etc. ~  11/15: Hess on same meds... ~  [facility]- no change, Hess heart size, COPD/Emphysema & mild right basilar atelectasis, NAD...Marland KitchenMarland Kitchen  CT Chest ([facility] w/ [facility]Pelvis[facility] sl dec[facility]  in size of the mid thoracic Ao aneurysm, Hess appearance of the 4-vessel fenestrated thoracoabdominal endograft... ~  3/16: on Advair250Bid & Spiriva daily, plus Mucinex600Bid; denies resp exac; he [facility]- Hess & no changes made...    HYPERTENSION (ICD-401.9)                          << followed by Cherly Hensen for Cards >> CONGESTIVE HEART FAILURE (ICD-428.0) ATRIAL FIBRILLATION (ICD-427.31) w/ clot in left atrial appendage >> resolved on f/u TEE, on COUMADIN per Cards & cardioverted. PREMATURE VENTRICULAR CONTRACTIONS, FREQUENT (ICD-427.69)  MEDS> prev on ATENOLOL 41m/d, & LASIX 266m2AM + KCl 1055mid, and off Losartan... prev Norvasc discontinued & hx ACE cough in the past on Lisinopril. ~  NuclearStressTest 1/05 was neg- no ischemia or infarct (+diaphrag attenuation), EF=56%... ~  repeat Nuclear study 6/09 was neg- no ischemia, mild inferoapic thinning, not gated due to PVCs... ~  2DEcho 6/09 showed mild dilated LV w/ EF= 45-50% but no regional wall motion abn, mild AoV calcif & AI, LA mild dil... ~  12/10:  [place] ~  2/11:  in hosp- Cath= mild nonobstructive 3 vessel CAD, mod LVD w/ EF=40-45%; TEE= CHF w/ EF~45% w/ HK & left atrial appendage clot & AFib; he was diuresed & Norvasc stopped, placed on Coumadin w/ careful f/u by DrNCherly Hensenthe CC... ~  [facility] w/ PVCs... ~  Recurrent AFib w/ RVR after his Thoracoabdominal stent graft 4/13... ~  6/13:  BP was low assoc w/ GU bleeding & ATENOLOL25 & LOSARTAN50 placed on HOLD... ~  COUMADIN on HOLD since GU bleeding & acute blood loss anemia; it has not yet been restarted... ~  8/13:  BP= 116/78 on LASIX 52m46m& K10-Bid;  Wt down 14# to 173# ~  10/13:  BP= 114/82 on Lasix40+K1-Bid (he takes Aten25 if BP>110 at home)... ~  12/13: he had Cards f/u DrNishan> note reviewed, no change in meds, he referred him to DrGearhart for f/u stent as [facility] did not [facility]~  5/14: HBP, CHF, AFib w/ hx clot in LA appendage- resolved on Coumadin> on ASA81, Aten25, Lasix40, K10Bid; BP= 114/72 & he denies CP, palpit etc; followed by DrNiCherly Hensen Cards- his notes are reviewed, Hess- no changes made. ~  8/14: he is post op [facility]  placed by DrFarber... ~  5/15: on ASA81, Coumadin, Aten25 (if BP>110), Losartan100, Lasix40; BP= 120/64 & he denies CP, palpit etc; followed by DrNiCherly Hensen Cards- his notes are reviewed, Hess- BNP 5/15= 184... ~  7/15: on ASA81, Coumadin, Aten25 (if BP>110), Losartan100, Lasix40; BP= 110/60 & he presented w/ incr left leg swelling after fall; Renal function sl worse w/ Cr=2.0 & rec to decr Losartan100=>50 ~  11/15: on ASA81, Coumadin, Losar50, Lasix40; BP= 130/72 & he denies CP, palpit etc; followed by DrNiCherly Hensen Cards- last seen 12/14, Hess- no changes made & he is due for f/u visit...... Marland KitchenMarland KitchenMarland KitchenRIPHERAL VASCULAR DISEASE (ICD-443.9) - back on ASA 81mg74m.  ~  he is s/p infrarenal AAA repair w/ right common iliac aneurysm repair (via Ao Bi-iliac graft) in 1998 by DrLawson; also has a known desc thor AA measuring ~5+cm and followed by DrGearhart...  ~  seen by DrGearhart 4/10 w/ CT scan showing ~5cm thoracoabdominal aneurysm w/o signif change...  ~  seen by DrGearhart  2/11 in hosp w/ sl incr size of AAA- f/u planned 55mo ~  also followed by [facility]- DrCooper. ~  seen by DrGearhardt 10/11 & Hess, no change, f/u 1 yr. ~  Seen 12/12 by DrGearhardt & CTscan is Hess, continue conservative approach... ~  2/13:  [facility]... ~  4/13:  He had a fenestrated endovasc repair of Thoraco-abdominal AA [facility]Far[facility] complic by lumbar epidural hematomas (on Coumadin) w/ paraplegia requiring readmission for laminectomy & evac of the hematomas;  [facility] and then [facility] SNF [facility] but only spent 4d there before [facility] by [facility] w/ altered mental status, pneumonia, AFib w/ rvr, & FTT> Neuro w/u was otherw neg (& he improved w/ supportive care)... ~  2/14:  CT Chest, Abdomen, Pelvis> per DrBrabham - see report (the abdominal component has enlarged)... ~  5/14: he had AAA repair by  DNena Polio he had a Thoracoabd Ao Aneurysm from the subclavian down to the prev Ao-bi-iliac graft, they decided on a fenestrated endovascular repair & a staged procedure- 4/13 they did the Thoracic endovasc aneurysm repair & he developed mult post op complications (see above 03/22/12 entry)... DrFarbers note indicates that to finish the repair he will require 3 branches and a left renal fenestration, they quoted him a risk of paraplegia in the 10-15% range & he will need preadmission for a lumbar drain to be placed, etc; [facility] ~  [facility]14[facility]/ 5 stents placed 04/15/13 in a >4h operation w/ special spinal precautions- he developed a blood clot in his left arm w/ some persist swelling; [facility] ~  [facility] w/ X[facility], CT scans, dopplers, etc on a regular basis- results reviewed in Care Everywhere section of Epic...  VENOUS INSUFFICIENCY, EDEMA >>  ~  He has chronic venous insuffic w/ mild chronic edema maintained on a low sodium diet, elevation, support hose when nec, & LASIX472md... ~  7/15:  Presented 2wks after fall at home, left leg red/ swollen/ sl tender, no broken skin/ drainage/ not hot etc; VenDopplers showed NEG for DVT & Protime was too thin... ~  11/15: edema resolved and back to baseline...  HYPERCHOLESTEROLEMIA (ICD-272.0) - on LIPITOR 1060m...  ~  FLPCooper[place]07 showed TChol 115, Tg 58, HDL 36, LDL 67 ~  FLP 5/08 showed TChol 118, TG 66, HDL 31, LDL 74 ~  FLP 5/09 showed TChol 126, TG 71, HDL 37, LDL 75 ~  FLP 6/10 showed TChol 116, TG 73, HDL 40, LDL 62 ~  2/11:  FLP not checked during the hospitalization... ~  FLP 4/11 in hosp showed TChol 94, TG 52, HDL 23, LDL 61 ~  FLP 12/12 on Lip10 showed TChol 107, TG 38, HDL 40, LDL 60 ~  FLP 3/16 on Lip10 showed TChol 133, TG 82, HDL 43, LDL 74  HYPOTHYROIDISM >> he remains clinically euthyroid... ~  Labs 12/12 showed TSH=  6.10 ~  Labs 6/13 showed TSH= 6.66 ~  Labs 5/14 showed TSH= 6.21... We decided to start SYNTHROID 36m68m... ~  Labs 5/15 on Levothy50 showed TSH= 1.92 ~  Labs 3/16 on Synthroid50 showed TSH= 2.82  DIVERTICULOSIS OF COLON (ICD-562.10) & COLONIC POLYPS (ICD-211.3) - hx polyps in  2003 = tubular adenoma... ~  last colonoscopy 9/06 showed divertics, hems, no polyps. ~  Pt followed by DrDBrodie & they decided to forgo any further GI procedures/ colonoscopies...  ISCHEMIC BOWEL >> Ryan Hess presented to the ER 7/23 - 04/25/12 w/ abd pain- ELap surg revealed ~5 feet of frankly ischemic small bowel, likely due to internal hernia and closed loop obstruction=> Bowel resected & pulses intact w/ no evidence of embolic or thrombotic pathology;  Viable small bowel anastomosed & wound vac applied; he had a Hess post op course...  RENAL INSUFFICIENCY >>  PROSTATE CANCER (ICD-185) - eval by DrWrenn and they decided on XRT by DrKinard- finished 5/09 and he states that DrKinard "released me"... he sees DrWrenn every 6 months & they follow PSA closely (notes reviewed)... ~  8/12: f/u prostate cancer dx in 2009 & treated w/ XRT completed 6/09; slowly rising PSA w/ doubling time 6-37mo min voiding symptoms, he is considering androgen ablation if needed... ~  6/13: He developed hematuria & urinary retention requiring Urology eval, discontinuation of Coumadin, & Foley placement=> subseq removed 8/13 w/ adeq voiding trial... ~  3/14:  He had f/u visit w/ DrWrenn- PSA nadir after XRT was <0.04 and it has climbed to 0.54 (3/14) w/ PSADT of ~165yrthey chose to continue watchful waiting... ~  1/15:  DrWrenn reports that PSA is up to 1.46 ~  Labs 5/15 on Lasix40 showed Cr=1.6, BNP=184 ~  7/15: he had f/u w/ DrWrenn> BPH w/ BOO, Prostate ca; on Flomax0.4, PSA up to 1.84, neg bone scan x degen changes; they continue to follow w/ watchful waiting, ROV in 62m1mo ~  He maintains regular f/u w/ drWrenn> we do not have recent clinic  notes but pt indicates that everthing is ok... ~  3/16: [facility] & reviewed in CarDaytondicates thickening & trabeculation of the bladder wall & prox ascending bowel wall thickening... NOTE> they rec f/u w/ Urology & records sent in light of these findings... ~  Labs 3/16 showed BUN=32, Cr= 1.85...  DEGENERATIVE JOINT DISEASE (ICD-715.90) - s/p bilat TKR's, [facility]- DrOlin... He has OXY-IR 5mg48m needed for pain...  ACTINIC SKIN DAMAGE (ICD-692.70) - followed by DrHoBrunetta Jeans knows to avoid sun exposure, use sun screen, etc...  ANEMIA >> ~  Labs 6/13 showed Hg= 8.4, Fe= 31 (11%sat); Rec to start FeSO4 Bid... ~  Labs 7/13 in hosp showed Hg= 12.6=>8.2 at disch... ~  Labs 8/13 showed Hg= 9.5, MCV= 94; rec to continue FeSO4 Bid... ~  Labs 10/13 showed Hg= 13.2 & ok to wean Fe to 1/d til gone then stop... ~  Labs 5/14 showed Hg= 14.2 ~  Labs 5/15 showed Hg= 13.2 ~  Labs 3/16 showed Hg= 14.4   Past Surgical History  Procedure Laterality Date  . Total knee arthroplasty      bilateral  . Abdominal aortic aneurysm repair  1998  . Appendectomy    . Inguinal hernia repair      left  . Joint replacement    . Laparotomy  04/16/2012    Procedure: EXPLORATORY LAPAROTOMY;  Surgeon: BriaMadilyn Hook;  [facility] [place];  Laterality: N/A;  exploratory Laparotomy,Small bowel ressection abdominal wound vac placement.  . Laparotomy  04/18/2012    Procedure: EXPLORATORY LAPAROTOMY;  Surgeon: ThomJoyice Fasterrnett, MD;  Location: [facility] [place]: General;  Laterality: N/A;  exploratory laparotomy with small bowel anastomosis  . Application of wound vac  04/18/2012    Procedure: APPLICATION OF WOUND VAC;  Surgeon: Joyice Faster. Cornett, MD;  Location: [place];  Service: General;  Laterality: N/A;  Removal of abdominal wound vac, Application of incisional  wound vac  . Repair of aa  04/14/2013    done at [place] Encounter Prescriptions as of  12/14/2015  Medication Sig  . aspirin 81 MG tablet Take 81 mg by mouth daily.   [name] atenolol (TENORMIN) 25 MG tablet Take 1 tablet (25 mg total) by mouth daily.  [name] atorvastatin (LIPITOR) 10 MG tablet TAKE 1 TABLET BY MOUTH ONCE DAILY  . Fluticasone-Salmeterol (ADVAIR DISKUS) 250-50 MCG/DOSE AEPB inhale 1 dose by mouth twice a day  . furosemide (LASIX) 40 MG tablet TAKE 1 TABLET BY MOUTH EVERY DAY  . guaiFENesin (MUCINEX) 600 MG 12 hr tablet Take 600 mg by mouth 2 (two) times daily. COPD.  [name] ibuprofen (ADVIL,MOTRIN) 200 MG tablet Take 200 mg by mouth every 6 (six) hours as needed for fever, headache or moderate pain.   [name] levothyroxine (SYNTHROID, LEVOTHROID) 50 MCG tablet TAKE 1 TABLET BY MOUTH EVERY DAY  . losartan (COZAAR) 100 MG tablet Take 50 mg by mouth daily.  . Multiple Vitamin (MULTIVITAMIN) tablet Take 1 tablet by mouth daily.    [name] SPIRIVA HANDIHALER 18 MCG inhalation capsule USE 2 INHALATIONS FROM ONE CAPSULE DAILY AS DIRECTED  . traMADol (ULTRAM) 50 MG tablet TAKE 1 TABLET BY MOUTH 3 TIMES A DAY AS NEEDED FOR PAIN  . warfarin (COUMADIN) 2.5 MG tablet TAKE AS DIRECTED BY ANTICOAGULATION CLINIC  . [DISCONTINUED] atorvastatin (LIPITOR) 10 MG tablet TAKE 1 TABLET BY MOUTH DAILY  . [DISCONTINUED] cephALEXin (KEFLEX) 500 MG capsule Take 500 mg by mouth daily.  . [DISCONTINUED] tiotropium (SPIRIVA) 18 MCG inhalation capsule Place 1 capsule (18 mcg total) into inhaler and inhale daily.   No facility-administered encounter medications on file as of 12/14/2015.    No Known Allergies   Current Medications, Allergies, Past Medical History, Past Surgical History, Family History, and Social History were reviewed in [employer] record.   Review of Systems        See HPI - all other systems neg except as noted...  The patient complains of dyspnea on exertion.  The patient denies anorexia, fever, weight loss, weight gain, vision loss, decreased hearing, hoarseness, chest  pain, syncope, peripheral edema, prolonged cough, headaches, hemoptysis, abdominal pain, melena, hematochezia, severe indigestion/heartburn, hematuria, incontinence, muscle weakness, suspicious skin lesions, transient blindness, difficulty walking, depression, unusual weight change, abnormal bleeding, enlarged lymph nodes, and angioedema.     Objective:   Physical Exam    WD, WN, 82 y/o WM in NAD... GENERAL:  Alert & oriented; pleasant & cooperative; pale complexion... HEENT:  /AT, EOM-wnl, PERRLA, EACs-clear, TMs-wnl, NOSE-clear, THROAT-clear & wnl. NECK:  Supple w/ fairROM; no JVD; normal carotid impulses w/o bruits; no thyromegaly or nodules palpated; no lymphadenopathy. CHEST:  decr BS bilat, scat bibasilar rales, w/o wheezing/ rhonchi/ signs of consolidation. HEART:  irregular rhythm, gr1/6 diast murmur in Ao area, without rubs or gallops detected... ABDOMEN:  Soft & nontender; normal bowel sounds; no organomegaly or masses palpated... EXT: s/p bilat TKR's, mod arthritic changes; +venous insuffic, left leg swelling/ red > right leg, no drainage NEURO:  CN's intact;  gait abn; no focal neuro deficits... DERM:  No lesions noted; no rash etc...  RADIOLOGY DATA:  Reviewed in the EPIC EMR & discussed w/ the patient...  LABORATORY DATA:  Reviewed in the [facility] EMR & discussed w/ the patient...   Assessment:      COPD/Emphysema, Hx Pneumonia>  baseline severe COPD/Emphysema w/ Cardiomeg, Aneurysm & bibasilar fibrosis, Hess on Advair250, Spiriva, Mucinex...  HBP>  HBP, CHF, AFib> on Coumadin via [facility], ASA81, Aten25, Losar25, Lasix40; BP= 120/60 range & they note that DrFarber doesn't want it too low due to his atherosclerosis & the endograft; he saw [facility] 10/2015...  CAD/ CHF/ etc>  Followed by Cherly Hensen & his prev notes are reviewed;  BNP 12/14 was up to 4200 & on Lasix40 it has improved to 184 by 5/15; 3/17 it measures 462 & reminded low sodium...Marland KitchenMarland KitchenMarland Kitchen  AFib>  On above + Coumadin  followed in the [facility]...  Periph Vasc Dis>  S/p AAA repair 1998 by Sheryn Bison; then followed by DrGearhardt for thoraco-abd aneurysm & referred to [facility] for stent graft surg- done 4/13 by DrFarber w/ complications, and 2nd stage of the surgery 7/14 w/ 5 stents placed per DrFarber... They continue to follow regularly w/ CT scans every 34mo..  Ven Insuffic & Edema>  Prev improved w/ Lasix40/d;  He knows to elim salt, elev legs, wear support hose, etc;  He fell 7/15 w/ bruise reported, then followed by redness in skin & incr left leg swelling=> VenDoppler is neg for DVT & Protime was too thin=> adjusted; swelling resolved over time & back to baseline...  CHOL>  FLP looks good on Lip10...  Subclinical Hypothyroid>  TSH was 6.21 and Synthroid50 started 5/14=> TSH improved to 2.82...  GI> Presbyesoph, Divertics, Hx polpyps>  Hess & followed by DrDBrodie... 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  Prostate Cancer>  Followed by DrWrenn  w/ slowly rising PSA as noted; then developed urinary retention after lumbar surg and hematuria after foley & coumadin; now back to baseline & being followed... 9/16> [facility] to take to f/u appt w/ DrWrenn...  DJD>  Followed by DrOlin, s/p bilat TKRs...  ANEMIA>  He developed acute blood loss anemia w/ post op Hg= 11-12 but dropped to 8.6 w/ hematuria & INR=4.2; Coumadin & ASA placed on HOLD; f/u labs showed Hg=8.4 & Fe=31 & started on FeSO4 Bid;  Then Hg=9.5 & rec to continue Fe Bid;  Cards restarted his Coumadin;  Now Hg= 14.4   Actinic skin changes>  Aware, he is monitored by Derm...     Plan:     Patient's Medications  New Prescriptions   No medications on file  Previous Medications   ASPIRIN 81 MG TABLET    Take 81 mg by mouth daily.    ATENOLOL (TENORMIN) 25 MG TABLET    Take 1 tablet (25 mg total)  by mouth daily.   ATORVASTATIN (LIPITOR) 10 MG TABLET    TAKE 1 TABLET BY MOUTH ONCE DAILY   FLUTICASONE-SALMETEROL (ADVAIR DISKUS) 250-50 MCG/DOSE AEPB    inhale 1 dose by mouth twice a day   FUROSEMIDE (LASIX) 40 MG TABLET    TAKE 1 TABLET BY MOUTH EVERY DAY   GUAIFENESIN (MUCINEX) 600 MG 12 HR TABLET    Take 600 mg by mouth 2 (two) times daily. COPD.   IBUPROFEN (ADVIL,MOTRIN) 200 MG TABLET    Take 200 mg by mouth every 6 (six) hours as needed for fever, headache or moderate pain.    LEVOTHYROXINE (SYNTHROID, LEVOTHROID)  50 MCG TABLET    TAKE 1 TABLET BY MOUTH EVERY DAY   LOSARTAN (COZAAR) 100 MG TABLET    Take 50 mg by mouth daily.   MULTIPLE VITAMIN (MULTIVITAMIN) TABLET    Take 1 tablet by mouth daily.     SPIRIVA HANDIHALER 18 MCG INHALATION CAPSULE    USE 2 INHALATIONS FROM ONE CAPSULE DAILY AS DIRECTED   TRAMADOL (ULTRAM) 50 MG TABLET    TAKE 1 TABLET BY MOUTH 3 TIMES A DAY AS NEEDED FOR PAIN   WARFARIN (COUMADIN) 2.5 MG TABLET    TAKE AS DIRECTED BY ANTICOAGULATION CLINIC  Modified Medications   No medications on file  Discontinued Medications   ATORVASTATIN (LIPITOR) 10 MG TABLET    TAKE 1 TABLET BY MOUTH DAILY   CEPHALEXIN (KEFLEX) 500 MG CAPSULE    Take 500 mg by mouth daily.   TIOTROPIUM (SPIRIVA) 18 MCG INHALATION CAPSULE    Place 1 capsule (18 mcg total) into inhaler and inhale daily.

## 2015-12-16 ENCOUNTER — Other Ambulatory Visit (INDEPENDENT_AMBULATORY_CARE_PROVIDER_SITE_OTHER): Payer: Medicare Other

## 2015-12-16 ENCOUNTER — Other Ambulatory Visit: Payer: Self-pay | Admitting: Pulmonary Disease

## 2015-12-16 ENCOUNTER — Ambulatory Visit (INDEPENDENT_AMBULATORY_CARE_PROVIDER_SITE_OTHER): Payer: Medicare Other | Admitting: *Deleted

## 2015-12-16 DIAGNOSIS — R748 Abnormal levels of other serum enzymes: Secondary | ICD-10-CM | POA: Diagnosis not present

## 2015-12-16 DIAGNOSIS — I4891 Unspecified atrial fibrillation: Secondary | ICD-10-CM | POA: Diagnosis not present

## 2015-12-16 LAB — HEPATIC FUNCTION PANEL
ALT: 19 U/L (ref 0–53)
AST: 24 U/L (ref 0–37)
Albumin: 3.4 g/dL — ABNORMAL LOW (ref 3.5–5.2)
Alkaline Phosphatase: 196 U/L — ABNORMAL HIGH (ref 39–117)
Bilirubin, Direct: 0.4 mg/dL — ABNORMAL HIGH (ref 0.0–0.3)
Total Bilirubin: 1.4 mg/dL — ABNORMAL HIGH (ref 0.2–1.2)
Total Protein: 6.4 g/dL (ref 6.0–8.3)

## 2015-12-16 LAB — POCT INR: INR: 4.6

## 2015-12-16 LAB — GAMMA GT: GGT: 119 U/L — ABNORMAL HIGH (ref 7–51)

## 2015-12-16 NOTE — Progress Notes (Signed)
Quick Note:  Called and spoke to pt's son, Coralyn Mark. Informed him of the results and recs per SN. Pt verbalized understanding and denied any further questions or concerns at this time.   ______

## 2015-12-17 ENCOUNTER — Other Ambulatory Visit: Payer: Self-pay | Admitting: Pulmonary Disease

## 2015-12-22 ENCOUNTER — Ambulatory Visit (HOSPITAL_COMMUNITY)
Admission: RE | Admit: 2015-12-22 | Discharge: 2015-12-22 | Disposition: A | Discharge status: Home / Self Care | Payer: Medicare Other | Source: Ambulatory Visit | Attending: Pulmonary Disease | Admitting: Pulmonary Disease

## 2015-12-22 DIAGNOSIS — R932 Abnormal findings on diagnostic imaging of liver and biliary tract: Secondary | ICD-10-CM | POA: Insufficient documentation

## 2015-12-22 DIAGNOSIS — N281 Cyst of kidney, acquired: Secondary | ICD-10-CM | POA: Insufficient documentation

## 2015-12-22 DIAGNOSIS — R748 Abnormal levels of other serum enzymes: Secondary | ICD-10-CM | POA: Insufficient documentation

## 2015-12-28 ENCOUNTER — Ambulatory Visit (INDEPENDENT_AMBULATORY_CARE_PROVIDER_SITE_OTHER): Payer: Medicare Other | Admitting: *Deleted

## 2015-12-28 DIAGNOSIS — I4891 Unspecified atrial fibrillation: Secondary | ICD-10-CM | POA: Diagnosis not present

## 2015-12-28 LAB — POCT INR: INR: 2.5

## 2016-01-17 NOTE — Progress Notes (Signed)
Patient ID: BEATRIZ JENNISON, male   DOB: 04-Jan-1924, 82 y.o.   MRN: [id number]   Sloman returns today for followup. . He has a history of aortic aneurysm repair by Dr. Servando Snare. He has residual  descending thoracic aneurysm that was stented at [facility] in [place]  2013  After stent he subsequently had ischemic bowel which required emergent surgery with secondary closure. Back on coumadin Reviewed INR;s and they have been good at 2.2-2.6 With no bleeding issues   Also  complicated by cord edema and he had to have a lumbar procedure. He has hypertension. The last time I saw him had a bit of a cough and we stopped his lisinopril and switched him to Atacand. His cough seems to be improved. He is not having any chest pain, PND, or orthopnea. He is active. He walks on a regular basis. He tends towards bradycardia and has had occasional PVCs. Chronic afib on coumadin with no bleeding problems He had a Myoview study done March 24, 2008, which was nonischemic. EF was not calculated due to PVCs. His EF by echo on March 24, 2008, was 45-50%. We had him on low-dose atenolol and this seems to have settled him down.   Seen in ER 12/15 for right sided chest pain ? Pleurisy  Given zpack  And anti inflammatory Lasix helping with LE edema .    Recent UTI that set him back a bit Unsteady on feet using cane  Sees Dr Lenna Gilford for lungs no active wheezing   Last visit atenolol added back for rate control Was not actively wheezing   ROS: Denies fever, malais, weight loss, blurry vision, decreased visual acuity, cough, sputum, SOB, hemoptysis, pleuritic pain, palpitaitons, heartburn, abdominal pain, melena, lower extremity edema, claudication, or rash.  All other systems reviewed and negative  General: Affect appropriate Frail elderly male  HEENT: normal Neck supple with no adenopathy JVP normal no bruits no thyromegaly Lungs poor air movement no wheezing and good diaphragmatic motion Heart:  S1/S2 SEM  murmur, no rub, gallop  or click PMI normal Abdomen: benighn, BS positve, no tenderness, no AAA no bruit.  No HSM or HJR Distal pulses intact with no bruits Plus one bilateral edema Neuro non-focal Skin warm and dry No muscular weakness   Current Outpatient Prescriptions  Medication Sig Dispense Refill  . aspirin 81 MG tablet Take 81 mg by mouth daily.     [name] atenolol (TENORMIN) 25 MG tablet Take 1 tablet (25 mg total) by mouth daily. 90 tablet 3  . atorvastatin (LIPITOR) 10 MG tablet TAKE 1 TABLET BY MOUTH ONCE DAILY 90 tablet 3  . Fluticasone-Salmeterol (ADVAIR DISKUS) 250-50 MCG/DOSE AEPB inhale 1 dose by mouth twice a day 180 each 3  . furosemide (LASIX) 40 MG tablet TAKE 1 TABLET BY MOUTH EVERY DAY 90 tablet 2  . guaiFENesin (MUCINEX) 600 MG 12 hr tablet Take 600 mg by mouth 2 (two) times daily. COPD.    [name] ibuprofen (ADVIL,MOTRIN) 200 MG tablet Take 200 mg by mouth every 6 (six) hours as needed for fever, headache or moderate pain.     [name] levothyroxine (SYNTHROID, LEVOTHROID) 50 MCG tablet TAKE 1 TABLET BY MOUTH EVERY DAY 90 tablet 3  . losartan (COZAAR) 100 MG tablet TAKE 1 TABLET BY MOUTH EVERY DAY 90 tablet 3  . Multiple Vitamin (MULTIVITAMIN) tablet Take 1 tablet by mouth daily.      [name] SPIRIVA HANDIHALER 18 MCG inhalation capsule USE 2 INHALATIONS FROM ONE CAPSULE DAILY  AS DIRECTED 90 capsule 0  . traMADol (ULTRAM) 50 MG tablet TAKE 1 TABLET BY MOUTH 3 TIMES A DAY AS NEEDED FOR PAIN 90 tablet 1  . warfarin (COUMADIN) 2.5 MG tablet TAKE AS DIRECTED BY ANTICOAGULATION CLINIC 30 tablet 3  . [DISCONTINUED] pantoprazole (PROTONIX) 40 MG tablet Take 1 tablet (40 mg total) by mouth daily. 30 tablet 5   No current facility-administered medications for this visit.    Allergies  Review of patient's allergies indicates no known allergies.  Electrocardiogram:  12/15  AFib rate 100 PVC nonspecific ST/T wave changes  11/15/15  Afib rate 114 LAD PVC  Nonspecific ST/T wave changes  01/18/16  afib with frequent  PVC;s    Assessment and Plan  Chronic afib: better rate control with atenolol continue coumadin no bleeding issues   Chest Pain: ? Pleurisy resolved observe  HTN:  Now tends towards hypotension all drugs stopped except Atenolol for rate control  Thyroid   . Lab Results  Component Value Date   TSH 6.98* 12/14/2015    COPD:  No active wheezing should tolerate low dose atenolol  Chol   Lab Results  Component Value Date   LDLCALC 65 12/14/2015    Vascular:  Post stenting of thoracic aneurysm f/u [facility]  PVC;s  More frequent in office will update echo and make sure EF not reduced further If so consider changing beta blocker to coreg and adding amiodarone    [employer]

## 2016-01-18 ENCOUNTER — Encounter: Payer: Self-pay | Admitting: Cardiovascular Disease

## 2016-01-18 ENCOUNTER — Ambulatory Visit (INDEPENDENT_AMBULATORY_CARE_PROVIDER_SITE_OTHER): Payer: Medicare Other | Admitting: *Deleted

## 2016-01-18 ENCOUNTER — Ambulatory Visit (INDEPENDENT_AMBULATORY_CARE_PROVIDER_SITE_OTHER): Payer: Medicare Other | Admitting: Cardiovascular Disease

## 2016-01-18 VITALS — BP 112/60 | HR 67 | Ht 71.0 in | Wt 172.6 lb

## 2016-01-18 DIAGNOSIS — I493 Ventricular premature depolarization: Secondary | ICD-10-CM

## 2016-01-18 DIAGNOSIS — I4891 Unspecified atrial fibrillation: Secondary | ICD-10-CM

## 2016-01-18 LAB — POCT INR: INR: 2.4

## 2016-01-18 NOTE — Patient Instructions (Addendum)

## 2016-02-02 ENCOUNTER — Ambulatory Visit (HOSPITAL_COMMUNITY): Payer: Medicare Other | Attending: Internal Medicine

## 2016-02-02 ENCOUNTER — Other Ambulatory Visit: Payer: Self-pay

## 2016-02-02 DIAGNOSIS — I071 Rheumatic tricuspid insufficiency: Secondary | ICD-10-CM | POA: Diagnosis not present

## 2016-02-02 DIAGNOSIS — I509 Heart failure, unspecified: Secondary | ICD-10-CM | POA: Insufficient documentation

## 2016-02-02 DIAGNOSIS — I4891 Unspecified atrial fibrillation: Secondary | ICD-10-CM | POA: Diagnosis not present

## 2016-02-02 DIAGNOSIS — I340 Nonrheumatic mitral (valve) insufficiency: Secondary | ICD-10-CM | POA: Diagnosis not present

## 2016-02-02 DIAGNOSIS — I493 Ventricular premature depolarization: Secondary | ICD-10-CM | POA: Insufficient documentation

## 2016-02-02 DIAGNOSIS — I110 Hypertensive heart disease with heart failure: Secondary | ICD-10-CM | POA: Diagnosis not present

## 2016-02-02 DIAGNOSIS — I351 Nonrheumatic aortic (valve) insufficiency: Secondary | ICD-10-CM | POA: Diagnosis not present

## 2016-02-09 ENCOUNTER — Other Ambulatory Visit: Payer: Self-pay | Admitting: Pulmonary Disease

## 2016-02-14 ENCOUNTER — Other Ambulatory Visit: Payer: Self-pay | Admitting: Pulmonary Disease

## 2016-02-15 ENCOUNTER — Ambulatory Visit (INDEPENDENT_AMBULATORY_CARE_PROVIDER_SITE_OTHER): Payer: Medicare Other | Admitting: *Deleted

## 2016-02-15 ENCOUNTER — Telehealth: Payer: Self-pay | Admitting: Pulmonary Disease

## 2016-02-15 DIAGNOSIS — I4891 Unspecified atrial fibrillation: Secondary | ICD-10-CM

## 2016-02-15 LAB — POCT INR: INR: 2.3

## 2016-02-15 MED ORDER — FLUTICASONE-SALMETEROL 250-50 MCG/DOSE IN AEPB: INHALATION_SPRAY | RESPIRATORY_TRACT | Status: DC

## 2016-02-15 NOTE — Telephone Encounter (Signed)
Spoke with the pt to verify the msg  Advair was sent to [facility] Nothing further needed

## 2016-03-06 ENCOUNTER — Other Ambulatory Visit: Payer: Self-pay | Admitting: Pulmonary Disease

## 2016-03-06 MED ORDER — TIOTROPIUM BROMIDE MONOHYDRATE 18 MCG IN CAPS: ORAL_CAPSULE | RESPIRATORY_TRACT | Status: DC

## 2016-03-06 NOTE — Telephone Encounter (Signed)
Received fax from [facility] stating that pt's Spiriva Rx has expired Last ov 3.21.17 w/ SN Refills sent Will sign off

## 2016-03-13 ENCOUNTER — Other Ambulatory Visit: Payer: Self-pay | Admitting: Pulmonary Disease

## 2016-03-15 NOTE — Telephone Encounter (Signed)
Per SN- okay to refill Tramadol 50mg  #90 x 0 refills.  This has been called in to the pharmacy. Nothing further needed.

## 2016-03-20 ENCOUNTER — Other Ambulatory Visit: Payer: Self-pay | Admitting: Pulmonary Disease

## 2016-03-21 ENCOUNTER — Ambulatory Visit (INDEPENDENT_AMBULATORY_CARE_PROVIDER_SITE_OTHER): Payer: Medicare Other | Admitting: Pharmacist

## 2016-03-21 DIAGNOSIS — I4891 Unspecified atrial fibrillation: Secondary | ICD-10-CM | POA: Diagnosis not present

## 2016-03-21 LAB — POCT INR: INR: 2.2

## 2016-03-27 ENCOUNTER — Other Ambulatory Visit: Payer: Self-pay | Admitting: Pulmonary Disease

## 2016-05-02 ENCOUNTER — Ambulatory Visit (INDEPENDENT_AMBULATORY_CARE_PROVIDER_SITE_OTHER): Payer: Medicare Other | Admitting: *Deleted

## 2016-05-02 DIAGNOSIS — I4891 Unspecified atrial fibrillation: Secondary | ICD-10-CM

## 2016-05-02 LAB — POCT INR: INR: 3.6

## 2016-05-07 ENCOUNTER — Other Ambulatory Visit: Payer: Self-pay | Admitting: Pulmonary Disease

## 2016-05-16 ENCOUNTER — Ambulatory Visit (INDEPENDENT_AMBULATORY_CARE_PROVIDER_SITE_OTHER): Payer: Medicare Other | Admitting: *Deleted

## 2016-05-16 DIAGNOSIS — I4891 Unspecified atrial fibrillation: Secondary | ICD-10-CM

## 2016-05-16 LAB — POCT INR: INR: 5.4

## 2016-05-19 ENCOUNTER — Telehealth: Payer: Self-pay

## 2016-05-19 NOTE — Telephone Encounter (Signed)
Patient has enough atenolol for the weekend and for the first of next week. Informed patient to call our office if he runs out of his medication.

## 2016-05-19 NOTE — Telephone Encounter (Signed)
Follow Up:; ° ° °Returning your call. °

## 2016-05-19 NOTE — Telephone Encounter (Signed)
Received a request from [facility] to change Atenolol to another medication due to medication being on back order [place] wide. Will forward to Dr. Johnsie Cancel for further advisement.

## 2016-05-21 NOTE — Telephone Encounter (Signed)
Ok to call in toprol 25 mg

## 2016-05-22 NOTE — Telephone Encounter (Signed)
Patient will call if his refill is not available for pick-up this week.

## 2016-05-23 ENCOUNTER — Telehealth: Payer: Self-pay | Admitting: Cardiovascular Disease

## 2016-05-23 MED ORDER — METOPROLOL SUCCINATE ER 25 MG PO TB24: 25.0000 mg | ORAL_TABLET | Freq: Every day | ORAL | 3 refills | Status: AC

## 2016-05-23 NOTE — Telephone Encounter (Signed)
Patient calling and letting our office know the pharmacy does not have atenolol at this time. Patient has less than a week supply. See phone note from 05/19/16. Per Dr. Johnsie Cancel okay to call in Toprol 25 mg. Patient will take Toprol the day after his last dose of Atenolol.

## 2016-05-23 NOTE — Telephone Encounter (Signed)
Follow Up.        Please call,concerning his Atenolol,

## 2016-05-23 NOTE — Telephone Encounter (Signed)
Left message for patient to call back  

## 2016-05-30 ENCOUNTER — Ambulatory Visit (INDEPENDENT_AMBULATORY_CARE_PROVIDER_SITE_OTHER): Payer: Medicare Other | Admitting: *Deleted

## 2016-05-30 DIAGNOSIS — I4891 Unspecified atrial fibrillation: Secondary | ICD-10-CM | POA: Diagnosis not present

## 2016-05-30 LAB — POCT INR: INR: 1.9

## 2016-06-13 ENCOUNTER — Ambulatory Visit (INDEPENDENT_AMBULATORY_CARE_PROVIDER_SITE_OTHER): Payer: Medicare Other | Admitting: Pharmacist

## 2016-06-13 DIAGNOSIS — I4891 Unspecified atrial fibrillation: Secondary | ICD-10-CM

## 2016-06-13 LAB — PROTIME-INR
INR: 6.7 — ABNORMAL HIGH
Prothrombin Time: 65.4 s — ABNORMAL HIGH (ref 9.0–11.5)

## 2016-06-13 LAB — POCT INR: INR: 7.3

## 2016-06-15 ENCOUNTER — Ambulatory Visit: Payer: Medicare Other | Admitting: Pulmonary Disease

## 2016-06-16 ENCOUNTER — Telehealth: Payer: Self-pay | Admitting: Pulmonary Disease

## 2016-06-16 NOTE — Telephone Encounter (Signed)
Called spoke with Coralyn Mark. He reports pt is unable to have his coumadin checked today and reports it is getting more difficult to get patient out of the house. He is wanting to know if SN could order a home nurse to come out to start checking pt levels. Please advise Dr. Lenna Gilford thanks

## 2016-06-17 ENCOUNTER — Encounter (HOSPITAL_COMMUNITY): Payer: Self-pay

## 2016-06-17 ENCOUNTER — Inpatient Hospital Stay (HOSPITAL_COMMUNITY): Payer: Medicare Other

## 2016-06-17 ENCOUNTER — Inpatient Hospital Stay (HOSPITAL_COMMUNITY)
Admission: EM | Admit: 2016-06-17 | Discharge: 2016-06-26 | DRG: 377 | Disposition: A | Discharge status: Home / Self Care | Payer: Medicare Other | Attending: Internal Medicine | Admitting: Internal Medicine

## 2016-06-17 DIAGNOSIS — R791 Abnormal coagulation profile: Secondary | ICD-10-CM | POA: Diagnosis present

## 2016-06-17 DIAGNOSIS — Z96653 Presence of artificial knee joint, bilateral: Secondary | ICD-10-CM | POA: Diagnosis present

## 2016-06-17 DIAGNOSIS — R578 Other shock: Secondary | ICD-10-CM | POA: Diagnosis present

## 2016-06-17 DIAGNOSIS — I5022 Chronic systolic (congestive) heart failure: Secondary | ICD-10-CM | POA: Diagnosis present

## 2016-06-17 DIAGNOSIS — I716 Thoracoabdominal aortic aneurysm, without rupture, unspecified: Secondary | ICD-10-CM

## 2016-06-17 DIAGNOSIS — I482 Chronic atrial fibrillation: Secondary | ICD-10-CM | POA: Diagnosis present

## 2016-06-17 DIAGNOSIS — Z7982 Long term (current) use of aspirin: Secondary | ICD-10-CM

## 2016-06-17 DIAGNOSIS — I714 Abdominal aortic aneurysm, without rupture: Secondary | ICD-10-CM | POA: Diagnosis present

## 2016-06-17 DIAGNOSIS — N270 Small kidney, unilateral: Secondary | ICD-10-CM | POA: Diagnosis present

## 2016-06-17 DIAGNOSIS — L8990 Pressure ulcer of unspecified site, unspecified stage: Secondary | ICD-10-CM | POA: Diagnosis present

## 2016-06-17 DIAGNOSIS — N320 Bladder-neck obstruction: Secondary | ICD-10-CM | POA: Diagnosis present

## 2016-06-17 DIAGNOSIS — I712 Thoracic aortic aneurysm, without rupture: Secondary | ICD-10-CM | POA: Diagnosis present

## 2016-06-17 DIAGNOSIS — N136 Pyonephrosis: Secondary | ICD-10-CM | POA: Diagnosis present

## 2016-06-17 DIAGNOSIS — I130 Hypertensive heart and chronic kidney disease with heart failure and stage 1 through stage 4 chronic kidney disease, or unspecified chronic kidney disease: Secondary | ICD-10-CM | POA: Diagnosis present

## 2016-06-17 DIAGNOSIS — R109 Unspecified abdominal pain: Secondary | ICD-10-CM

## 2016-06-17 DIAGNOSIS — B961 Klebsiella pneumoniae [K. pneumoniae] as the cause of diseases classified elsewhere: Secondary | ICD-10-CM | POA: Diagnosis present

## 2016-06-17 DIAGNOSIS — N179 Acute kidney failure, unspecified: Secondary | ICD-10-CM

## 2016-06-17 DIAGNOSIS — I10 Essential (primary) hypertension: Secondary | ICD-10-CM | POA: Diagnosis present

## 2016-06-17 DIAGNOSIS — Z8249 Family history of ischemic heart disease and other diseases of the circulatory system: Secondary | ICD-10-CM

## 2016-06-17 DIAGNOSIS — E872 Acidosis, unspecified: Secondary | ICD-10-CM | POA: Diagnosis present

## 2016-06-17 DIAGNOSIS — R571 Hypovolemic shock: Secondary | ICD-10-CM | POA: Diagnosis present

## 2016-06-17 DIAGNOSIS — D62 Acute posthemorrhagic anemia: Secondary | ICD-10-CM | POA: Diagnosis present

## 2016-06-17 DIAGNOSIS — R911 Solitary pulmonary nodule: Secondary | ICD-10-CM | POA: Diagnosis present

## 2016-06-17 DIAGNOSIS — R4189 Other symptoms and signs involving cognitive functions and awareness: Secondary | ICD-10-CM

## 2016-06-17 DIAGNOSIS — K922 Gastrointestinal hemorrhage, unspecified: Secondary | ICD-10-CM | POA: Diagnosis present

## 2016-06-17 DIAGNOSIS — Z66 Do not resuscitate: Secondary | ICD-10-CM | POA: Diagnosis present

## 2016-06-17 DIAGNOSIS — I4891 Unspecified atrial fibrillation: Secondary | ICD-10-CM | POA: Diagnosis present

## 2016-06-17 DIAGNOSIS — N390 Urinary tract infection, site not specified: Secondary | ICD-10-CM | POA: Diagnosis present

## 2016-06-17 DIAGNOSIS — D696 Thrombocytopenia, unspecified: Secondary | ICD-10-CM | POA: Diagnosis present

## 2016-06-17 DIAGNOSIS — I739 Peripheral vascular disease, unspecified: Secondary | ICD-10-CM | POA: Diagnosis present

## 2016-06-17 DIAGNOSIS — R7989 Other specified abnormal findings of blood chemistry: Secondary | ICD-10-CM

## 2016-06-17 DIAGNOSIS — M6281 Muscle weakness (generalized): Secondary | ICD-10-CM

## 2016-06-17 DIAGNOSIS — J449 Chronic obstructive pulmonary disease, unspecified: Secondary | ICD-10-CM | POA: Diagnosis present

## 2016-06-17 DIAGNOSIS — Z79899 Other long term (current) drug therapy: Secondary | ICD-10-CM

## 2016-06-17 DIAGNOSIS — Z8546 Personal history of malignant neoplasm of prostate: Secondary | ICD-10-CM

## 2016-06-17 DIAGNOSIS — K921 Melena: Secondary | ICD-10-CM | POA: Diagnosis present

## 2016-06-17 DIAGNOSIS — E039 Hypothyroidism, unspecified: Secondary | ICD-10-CM | POA: Diagnosis present

## 2016-06-17 DIAGNOSIS — E875 Hyperkalemia: Secondary | ICD-10-CM | POA: Diagnosis present

## 2016-06-17 DIAGNOSIS — R627 Adult failure to thrive: Secondary | ICD-10-CM | POA: Diagnosis present

## 2016-06-17 DIAGNOSIS — E785 Hyperlipidemia, unspecified: Secondary | ICD-10-CM | POA: Diagnosis present

## 2016-06-17 DIAGNOSIS — R55 Syncope and collapse: Secondary | ICD-10-CM | POA: Diagnosis not present

## 2016-06-17 DIAGNOSIS — N183 Chronic kidney disease, stage 3 (moderate): Secondary | ICD-10-CM | POA: Diagnosis present

## 2016-06-17 DIAGNOSIS — J4489 Other specified chronic obstructive pulmonary disease: Secondary | ICD-10-CM | POA: Diagnosis present

## 2016-06-17 DIAGNOSIS — Z87891 Personal history of nicotine dependence: Secondary | ICD-10-CM

## 2016-06-17 DIAGNOSIS — F0390 Unspecified dementia without behavioral disturbance: Secondary | ICD-10-CM | POA: Diagnosis present

## 2016-06-17 DIAGNOSIS — Z7901 Long term (current) use of anticoagulants: Secondary | ICD-10-CM

## 2016-06-17 DIAGNOSIS — Z823 Family history of stroke: Secondary | ICD-10-CM

## 2016-06-17 LAB — URINALYSIS, ROUTINE W REFLEX MICROSCOPIC
Bilirubin Urine: NEGATIVE
Glucose, UA: NEGATIVE mg/dL
Ketones, ur: NEGATIVE mg/dL
Nitrite: NEGATIVE
Protein, ur: 30 mg/dL — AB
Specific Gravity, Urine: 1.012 (ref 1.005–1.030)
pH: 5.5 (ref 5.0–8.0)

## 2016-06-17 LAB — CBC WITH DIFFERENTIAL/PLATELET
Basophils Absolute: 0 10*3/uL (ref 0.0–0.1)
Basophils Relative: 0 %
Eosinophils Absolute: 0.1 10*3/uL (ref 0.0–0.7)
Eosinophils Relative: 1 %
HCT: 23.6 % — ABNORMAL LOW (ref 39.0–52.0)
Hemoglobin: 8.1 g/dL — ABNORMAL LOW (ref 13.0–17.0)
Lymphocytes Relative: 11 %
Lymphs Abs: 1 10*3/uL (ref 0.7–4.0)
MCH: 31.8 pg (ref 26.0–34.0)
MCHC: 34.3 g/dL (ref 30.0–36.0)
MCV: 92.5 fL (ref 78.0–100.0)
Monocytes Absolute: 0.8 10*3/uL (ref 0.1–1.0)
Monocytes Relative: 8 %
Neutro Abs: 7.5 10*3/uL (ref 1.7–7.7)
Neutrophils Relative %: 80 %
Platelets: 134 10*3/uL — ABNORMAL LOW (ref 150–400)
RBC: 2.55 MIL/uL — ABNORMAL LOW (ref 4.22–5.81)
RDW: 14.8 % (ref 11.5–15.5)
WBC: 9.5 10*3/uL (ref 4.0–10.5)

## 2016-06-17 LAB — POC OCCULT BLOOD, ED: Fecal Occult Bld: POSITIVE — AB

## 2016-06-17 LAB — COMPREHENSIVE METABOLIC PANEL
ALT: 17 U/L (ref 17–63)
AST: 20 U/L (ref 15–41)
Albumin: 2.5 g/dL — ABNORMAL LOW (ref 3.5–5.0)
Alkaline Phosphatase: 93 U/L (ref 38–126)
Anion gap: 12 (ref 5–15)
BUN: 141 mg/dL — ABNORMAL HIGH (ref 6–20)
CO2: 12 mmol/L — ABNORMAL LOW (ref 22–32)
Calcium: 8.1 mg/dL — ABNORMAL LOW (ref 8.9–10.3)
Chloride: 113 mmol/L — ABNORMAL HIGH (ref 101–111)
Creatinine, Ser: 5.39 mg/dL — ABNORMAL HIGH (ref 0.61–1.24)
GFR calc Af Amer: 10 mL/min — ABNORMAL LOW (ref >60)
GFR calc non Af Amer: 8 mL/min — ABNORMAL LOW (ref >60)
Glucose, Bld: 105 mg/dL — ABNORMAL HIGH (ref 65–99)
Potassium: 3.3 mmol/L — ABNORMAL LOW (ref 3.5–5.1)
Sodium: 137 mmol/L (ref 135–145)
Total Bilirubin: 1.1 mg/dL (ref 0.3–1.2)
Total Protein: 4.9 g/dL — ABNORMAL LOW (ref 6.5–8.1)

## 2016-06-17 LAB — PROTIME-INR
INR: 3.15
Prothrombin Time: 33.1 seconds — ABNORMAL HIGH (ref 11.4–15.2)

## 2016-06-17 LAB — URINE MICROSCOPIC-ADD ON: Squamous Epithelial / HPF: NONE SEEN

## 2016-06-17 LAB — PREPARE RBC (CROSSMATCH)

## 2016-06-17 LAB — I-STAT CG4 LACTIC ACID, ED: Lactic Acid, Venous: 1.62 mmol/L (ref 0.5–1.9)

## 2016-06-17 LAB — TROPONIN I: Troponin I: 0.04 ng/mL (ref <0.03)

## 2016-06-17 MED ORDER — SODIUM CHLORIDE 0.9 % IV SOLN
10.0000 mL/h | Freq: Once | INTRAVENOUS | Status: AC
  Administered 2016-06-17: 10 mL/h via INTRAVENOUS

## 2016-06-17 MED ORDER — SODIUM CHLORIDE 0.9 % IV SOLN
80.0000 mg | Freq: Once | INTRAVENOUS | Status: AC
  Administered 2016-06-18: 80 mg via INTRAVENOUS
  Filled 2016-06-17: qty 80

## 2016-06-17 MED ORDER — PANTOPRAZOLE SODIUM 40 MG IV SOLR: 40.0000 mg | Freq: Two times a day (BID) | INTRAVENOUS | Status: DC

## 2016-06-17 MED ORDER — DEXTROSE 5 % IV SOLN
1.0000 g | INTRAVENOUS | Status: DC
  Administered 2016-06-17 – 2016-06-18 (×2): 1 g via INTRAVENOUS
  Filled 2016-06-17 (×3): qty 10

## 2016-06-17 MED ORDER — TIOTROPIUM BROMIDE MONOHYDRATE 18 MCG IN CAPS
18.0000 ug | ORAL_CAPSULE | Freq: Every day | RESPIRATORY_TRACT | Status: DC
  Administered 2016-06-18 – 2016-06-26 (×7): 18 ug via RESPIRATORY_TRACT
  Filled 2016-06-17 (×2): qty 5

## 2016-06-17 MED ORDER — SODIUM CHLORIDE 0.9 % IV SOLN
8.0000 mg/h | INTRAVENOUS | Status: DC
  Administered 2016-06-18: 8 mg/h via INTRAVENOUS
  Filled 2016-06-17 (×4): qty 80

## 2016-06-17 MED ORDER — STERILE WATER FOR INJECTION IV SOLN
INTRAVENOUS | Status: DC
  Administered 2016-06-18: 02:00:00 via INTRAVENOUS
  Filled 2016-06-17 (×4): qty 850

## 2016-06-17 MED ORDER — LEVOTHYROXINE SODIUM 50 MCG PO TABS
50.0000 ug | ORAL_TABLET | Freq: Every day | ORAL | Status: DC
  Administered 2016-06-18 – 2016-06-19 (×2): 50 ug via ORAL
  Filled 2016-06-17 (×2): qty 1

## 2016-06-17 MED ORDER — SODIUM CHLORIDE 0.9 % IV BOLUS (SEPSIS)
1000.0000 mL | Freq: Once | INTRAVENOUS | Status: AC
  Administered 2016-06-17: 1000 mL via INTRAVENOUS

## 2016-06-17 MED ORDER — FLUTICASONE FUROATE-VILANTEROL 200-25 MCG/INH IN AEPB
1.0000 | INHALATION_SPRAY | Freq: Every day | RESPIRATORY_TRACT | Status: DC
  Administered 2016-06-18 – 2016-06-26 (×7): 1 via RESPIRATORY_TRACT
  Filled 2016-06-17 (×2): qty 28

## 2016-06-17 MED ORDER — SODIUM CHLORIDE 0.9 % IV SOLN
INTRAVENOUS | Status: DC
  Administered 2016-06-17: 17:00:00 via INTRAVENOUS

## 2016-06-17 MED ORDER — ATORVASTATIN CALCIUM 10 MG PO TABS
10.0000 mg | ORAL_TABLET | Freq: Every day | ORAL | Status: DC
  Administered 2016-06-18 – 2016-06-25 (×8): 10 mg via ORAL
  Filled 2016-06-17 (×8): qty 1

## 2016-06-17 MED ORDER — VITAMIN K1 10 MG/ML IJ SOLN
10.0000 mg | Freq: Once | INTRAVENOUS | Status: AC
  Administered 2016-06-17: 10 mg via INTRAVENOUS
  Filled 2016-06-17: qty 1

## 2016-06-17 NOTE — ED Provider Notes (Signed)
[name] DEPT Provider Note   CSN: [id number] Arrival date & time: 06/17/16  1618     History   Chief Complaint Chief Complaint  Patient presents with  . Fatigue  . Failure To Thrive    HPI Ryan Hess is a 82 y.o. male.  82 year old male presents with weakness and fatigue 3 weeks. According to the family, he has had black stools 1 week. Denies any fever, vomiting, abdominal discomfort. Patient does take Coumadin for history of atrial fibrillation. No focality to his weakness. Denies any headache. No cough or congestion. No recent medication changes. Patient is so weak that he can't walk around this point. Denies any falls. Does have a history of diverticulosis of the colon.      Past Medical History:  Diagnosis Date  . Actinic skin damage   . Atrial fibrillation ([place])   . CHF (congestive heart failure) ([place])   . Colon polyps   . COPD (chronic obstructive pulmonary disease) ([place])   . Diverticulosis of colon   . DJD (degenerative joint disease)   . Drug therapy   . HTN (hypertension)   . Hypercholesteremia   . Hyperlipidemia   . Internal hemorrhoids   . Pneumonia, organism unspecified   . Prostate cancer ([place])   . Pulmonary nodule   . PVC (premature ventricular contraction)   . PVD (peripheral vascular disease) ([place])   . Thoracic aortic aneurysm [facility])     Patient Active Problem List   Diagnosis Date Noted  . Memory loss 12/14/2014  . Left leg swelling 04/08/2014  . Chronic venous insufficiency 04/08/2014  . Edema 04/08/2014  . Hypothyroidism 09/11/2013  . LBP (low back pain) 08/19/2013  . Renal insufficiency 05/19/2013  . Pneumonia 03/25/2013  . Thoracic aortic aneurysm ([place]) 11/18/2012  . Abdominal aortic aneurysm ([place]) 11/04/2012  . Aortic regurgitation 08/29/2012  . Chronic systolic congestive heart failure ([place]) 04/19/2012  . Respiratory failure, post-operative ([place]) 04/17/2012  . Small bowel obstruction ([place]) 04/17/2012  . Constipation  03/22/2012  . Hematuria 03/22/2012  . Anemia due to blood loss 03/22/2012  . Encephalopathy 02/06/2012  . Urinary retention 02/03/2012  . Hyponatremia 02/03/2012  . Epidural hematoma ([place]) 01/11/2012  . OTHER DYSPHAGIA 07/15/2010  . PULMONARY NODULE 03/15/2010  . CONGESTIVE HEART FAILURE 11/16/2009  . Atrial fibrillation ([place]) 11/08/2009  . WEAKNESS 10/25/2009  . DYSPNEA 10/25/2009  . PREMATURE VENTRICULAR CONTRACTIONS, FREQUENT 03/17/2008  . PROSTATE CANCER 02/12/2008  . COLONIC POLYPS 02/12/2008  . Peripheral vascular disease ([place]) 02/12/2008  . COPD (chronic obstructive pulmonary disease) with chronic bronchitis ([place]) 02/12/2008  . DIVERTICULOSIS OF COLON 02/12/2008  . ACTINIC SKIN DAMAGE 02/12/2008  . HYPERCHOLESTEROLEMIA 08/19/2007  . Essential hypertension 08/19/2007  . Osteoarthritis 08/19/2007    Past Surgical History:  Procedure Laterality Date  . ABDOMINAL AORTIC ANEURYSM REPAIR  1998  . APPENDECTOMY    . APPLICATION OF WOUND VAC  04/18/2012   Procedure: APPLICATION OF WOUND VAC;  Surgeon: Joyice Faster. Cornett, MD;  Location: [facility];  Service: General;  Laterality: N/A;  Removal of abdominal wound vac, Application of incisional  wound vac  . INGUINAL HERNIA REPAIR     left  . JOINT REPLACEMENT    . LAPAROTOMY  04/16/2012   Procedure: EXPLORATORY LAPAROTOMY;  Surgeon: Madilyn Hook, DO;  Location: [place] [facility];  Service: General;  Laterality: N/A;  exploratory Laparotomy,Small bowel ressection abdominal wound vac placement.  [name] LAPAROTOMY  04/18/2012   Procedure: EXPLORATORY LAPAROTOMY;  Surgeon: Joyice Faster. Cornett, MD;  [facility];  Service: General;  Laterality: N/A;  exploratory laparotomy with small bowel anastomosis  . repair of AA  04/14/2013   done at [place]  . TOTAL KNEE ARTHROPLASTY     bilateral       Home Medications    Prior to Admission medications   Medication Sig Start Date End Date Taking? Authorizing Provider  aspirin 81 MG tablet Take 81 mg by  mouth daily.     Historical Provider, MD  atorvastatin (LIPITOR) 10 MG tablet TAKE 1 TABLET BY MOUTH DAILY 03/22/16   Noralee Space, MD  Fluticasone-Salmeterol (ADVAIR DISKUS) 250-50 MCG/DOSE AEPB inhale 1 dose by mouth twice a day 02/15/16   Noralee Space, MD  furosemide (LASIX) 40 MG tablet TAKE 1 TABLET BY MOUTH DAILY 05/08/16   Noralee Space, MD  guaiFENesin (MUCINEX) 600 MG 12 hr tablet Take 600 mg by mouth 2 (two) times daily. COPD.    Historical Provider, MD  ibuprofen (ADVIL,MOTRIN) 200 MG tablet Take 200 mg by mouth every 6 (six) hours as needed for fever, headache or moderate pain.     Historical Provider, MD  levothyroxine (SYNTHROID, LEVOTHROID) 50 MCG tablet TAKE 1 TABLET BY MOUTH DAILY 03/27/16   Noralee Space, MD  losartan (COZAAR) 100 MG tablet TAKE 1 TABLET BY MOUTH EVERY DAY 12/17/15   Noralee Space, MD  metoprolol succinate (TOPROL XL) 25 MG 24 hr tablet Take 1 tablet (25 mg total) by mouth daily. 05/23/16   Josue Hector, MD  Multiple Vitamin (MULTIVITAMIN) tablet Take 1 tablet by mouth daily.      Historical Provider, MD  tiotropium (SPIRIVA HANDIHALER) 18 MCG inhalation capsule USE 2 INHALATIONS FROM ONE CAPSULE DAILY AS DIRECTED 03/06/16   Noralee Space, MD  traMADol (ULTRAM) 50 MG tablet TAKE 1 TABLET BY MOUTH THREE TIMES DAILY AS NEEDED FOR PAIN 03/15/16   Noralee Space, MD  warfarin (COUMADIN) 2.5 MG tablet TAKE AS DIRECTED BY ANTICOAGULATION CLINIC 09/02/15   Josue Hector, MD    Family History Family History  Problem Relation Age of Onset  . Dementia Mother   . Stroke Father   . Diabetes Brother   . Heart disease Brother   . Heart disease Sister   . Heart disease Brother   . Obesity Brother   . Cancer Sister     back ??  . Alzheimer's disease Sister   . Colon cancer Neg Hx     Social History Social History  Substance Use Topics  . Smoking status: Former Smoker    Packs/day: 2.00    Years: 44.00    Types: Cigarettes    Quit date: 05/15/1982  . Smokeless  tobacco: Never Used  . Alcohol use No     Allergies   Review of patient's allergies indicates no known allergies.   Review of Systems Review of Systems  All other systems reviewed and are negative.    Physical Exam Updated Vital Signs Temp 97.8 F (36.6 C) (Oral)   SpO2 94%   Physical Exam  Constitutional: He is oriented to person, place, and time. He appears well-developed and well-nourished.  Non-toxic appearance. No distress.  HENT:  Head: Normocephalic and atraumatic.  Eyes: Conjunctivae, EOM and lids are normal. Pupils are equal, round, and reactive to light.  Neck: Normal range of motion. Neck supple. No tracheal deviation present. No thyroid mass present.  Cardiovascular: Normal rate, regular rhythm and normal heart sounds.  Exam reveals  no gallop.   No murmur heard. Pulmonary/Chest: Effort normal and breath sounds normal. No stridor. No respiratory distress. He has no decreased breath sounds. He has no wheezes. He has no rhonchi. He has no rales.  Abdominal: Soft. Normal appearance and bowel sounds are normal. He exhibits no distension. There is no tenderness. There is no rigidity, no rebound, no guarding and no CVA tenderness.  Genitourinary: Rectal exam shows no mass.  Genitourinary Comments: Dark stool noted  Musculoskeletal: Normal range of motion. He exhibits no edema or tenderness.  Neurological: He is alert and oriented to person, place, and time. He has normal strength. No cranial nerve deficit or sensory deficit. GCS eye subscore is 4. GCS verbal subscore is 5. GCS motor subscore is 6.  Skin: Skin is warm and dry. No abrasion and no rash noted. There is pallor.  Psychiatric: His affect is blunt. His speech is delayed. He is slowed.  Nursing note and vitals reviewed.    ED Treatments / Results  Labs (all labs ordered are listed, but only abnormal results are displayed) Labs Reviewed  URINE CULTURE  CBC WITH DIFFERENTIAL/PLATELET  COMPREHENSIVE METABOLIC  PANEL  PROTIME-INR  URINALYSIS, ROUTINE W REFLEX MICROSCOPIC (NOT AT [facility])  TROPONIN I  I-STAT CG4 LACTIC ACID, ED  TYPE AND SCREEN    EKG  EKG Interpretation  Date/Time:  Saturday June 17 2016 16:29:26 EDT Ventricular Rate:  109 PR Interval:    QRS Duration: 100 QT Interval:  383 QTC Calculation: 439 R Axis:   -32 Text Interpretation:  Atrial fibrillation Ventricular tachycardia, unsustained Left axis deviation Low voltage, extremity leads Minimal ST depression, anterolateral leads No significant change since last tracing Confirmed by Cruzita Lipa  MD, Alyza Artiaga ([id number]) on 06/17/2016 4:46:05 PM       Radiology No results found.  Procedures Procedures (including critical care time)  Medications Ordered in ED Medications  0.9 %  sodium chloride infusion (not administered)  sodium chloride 0.9 % bolus 1,000 mL (not administered)     Initial Impression / Assessment and Plan / ED Course  I have reviewed the triage vital signs and the nursing notes.  Pertinent labs & imaging results that were available during my care of the patient were reviewed by me and considered in my medical decision making (see chart for details).  Clinical Course    Patient given 2 units of prbcs and iv fluids for hypotension Pt dropped 6 grams of hb, has evidence of  AKI Vit K ordered by [facility] hospitalists D/w dr Michail Sermon , who will see the pt in consult  CRITICAL CARE Performed by: Leota Jacobsen Total critical care time: 50 minutes Critical care time was exclusive of separately billable procedures and treating other patients. Critical care was necessary to treat or prevent imminent or life-threatening deterioration. Critical care was time spent personally by me on the following activities: development of treatment plan with patient and/or surrogate as well as nursing, discussions with consultants, evaluation of patient's response to treatment, examination of patient, obtaining history from  patient or surrogate, ordering and performing treatments and interventions, ordering and review of laboratory studies, ordering and review of radiographic studies, pulse oximetry and re-evaluation of patient's condition.   Final Clinical Impressions(s) / ED Diagnoses   Final diagnoses:  None    New Prescriptions New Prescriptions   No medications on file     Lacretia Leigh, MD 06/17/16 1943

## 2016-06-17 NOTE — ED Triage Notes (Signed)
Pt arrived via [facility] from home c/o weakness and fatigue x3 weeks.  Family states concerns for failure to thrive.  Denies any pain at this time.

## 2016-06-17 NOTE — ED Notes (Signed)
2 UNITS READY 

## 2016-06-17 NOTE — H&P (Addendum)
History and Physical    DAIQUAN Hess [id number] DOB: 1923-09-30 DOA: 06/17/2016   PCP: Noralee Space, MD Chief Complaint:  Chief Complaint  Patient presents with  . Fatigue  . Failure To Thrive    HPI: Ryan Hess is a 82 y.o. male with medical history significant of complex TAAA s/p [facility]AR [facility], CKD stage 3, A.Fib on coumadin, HTN.  Patient presents to the ED with 3 weeks of intermittent melena that became consistent melena for the past 3 days or so.  No h/o N/V or abdominal pain.  No fever, no cough, no congestion.  Patient's family report that he is so weak he cannot walk which is why they brought him in.  Weakness is generalized without specific neurologic symptoms.  ED Course: HGB 8.1, INR 3.1, BUN 140, creat 5.x from a baseline of ~2, Bicarb 12 w/o anion gap.  UA c/w UTI, FOBT is positive.  3L NS and 2 units PRBC ordered for transfusion.  Review of Systems: As per HPI otherwise 10 point review of systems negative. Patient oriented to person and place only.   Past Medical History:  Diagnosis Date  . Actinic skin damage   . Atrial fibrillation ([place])   . CHF (congestive heart failure) ([place])   . Colon polyps   . COPD (chronic obstructive pulmonary disease) ([place])   . Diverticulosis of colon   . DJD (degenerative joint disease)   . Drug therapy   . HTN (hypertension)   . Hypercholesteremia   . Hyperlipidemia   . Internal hemorrhoids   . Pneumonia, organism unspecified   . Prostate cancer ([place])   . Pulmonary nodule   . PVC (premature ventricular contraction)   . PVD (peripheral vascular disease) ([place])   . Thoracic aortic aneurysm [facility])     Past Surgical History:  Procedure Laterality Date  . ABDOMINAL AORTIC ANEURYSM REPAIR  1998  . APPENDECTOMY    . APPLICATION OF WOUND VAC  04/18/2012   Procedure: APPLICATION OF WOUND VAC;  Surgeon: Joyice Faster. Cornett, MD;  Location: [facility];  Service: General;  Laterality: N/A;  Removal of abdominal wound vac, Application of  incisional  wound vac  . INGUINAL HERNIA REPAIR     left  . JOINT REPLACEMENT    . LAPAROTOMY  04/16/2012   Procedure: EXPLORATORY LAPAROTOMY;  Surgeon: Madilyn Hook, DO;  Location: [facility];  Service: General;  Laterality: N/A;  exploratory Laparotomy,Small bowel ressection abdominal wound vac placement.  [name] LAPAROTOMY  04/18/2012   Procedure: EXPLORATORY LAPAROTOMY;  Surgeon: Joyice Faster. Cornett, MD;  Location: [facility];  Service: General;  Laterality: N/A;  exploratory laparotomy with small bowel anastomosis  . repair of AA  04/14/2013   done at [place]  . TOTAL KNEE ARTHROPLASTY     bilateral     reports that he quit smoking about 34 years ago. His smoking use included Cigarettes. He has a 88.00 pack-year smoking history. He has never used smokeless tobacco. He reports that he does not drink alcohol or use drugs.  No Known Allergies  Family History  Problem Relation Age of Onset  . Dementia Mother   . Stroke Father   . Diabetes Brother   . Heart disease Brother   . Heart disease Sister   . Heart disease Brother   . Obesity Brother   . Cancer Sister     back ??  . Alzheimer's disease Sister   . Colon cancer Neg Hx  Prior to Admission medications   Medication Sig Start Date End Date Taking? Authorizing Provider  aspirin 81 MG tablet Take 81 mg by mouth every morning.    Yes Historical Provider, MD  atorvastatin (LIPITOR) 10 MG tablet TAKE 1 TABLET BY MOUTH DAILY Patient taking differently: TAKE 1 TABLET (10 mg) BY MOUTH DAILY 03/22/16  Yes Noralee Space, MD  Fluticasone-Salmeterol (ADVAIR DISKUS) 250-50 MCG/DOSE AEPB inhale 1 dose by mouth twice a day Patient taking differently: Inhale 1 puff into the lungs 2 (two) times daily.  02/15/16  Yes Noralee Space, MD  furosemide (LASIX) 40 MG tablet TAKE 1 TABLET BY MOUTH DAILY Patient taking differently: TAKE 1 TABLET (40 mg) BY MOUTH DAILY 05/08/16  Yes Noralee Space, MD  guaiFENesin (MUCINEX) 600 MG 12 hr tablet Take 600 mg by  mouth 2 (two) times daily. COPD.   Yes Historical Provider, MD  levothyroxine (SYNTHROID, LEVOTHROID) 50 MCG tablet TAKE 1 TABLET BY MOUTH DAILY Patient taking differently: TAKE 1 TABLET (50 mcg) BY MOUTH DAILY 03/27/16  Yes Noralee Space, MD  losartan (COZAAR) 100 MG tablet TAKE 1 TABLET BY MOUTH EVERY DAY Patient taking differently: TAKE 1 TABLET (100 mg) BY MOUTH EVERY DAY 12/17/15  Yes Noralee Space, MD  metoprolol succinate (TOPROL XL) 25 MG 24 hr tablet Take 1 tablet (25 mg total) by mouth daily. 05/23/16  Yes Josue Hector, MD  Multiple Vitamin (MULTIVITAMIN) tablet Take 1 tablet by mouth daily.     Yes Historical Provider, MD  tiotropium (SPIRIVA HANDIHALER) 18 MCG inhalation capsule USE 2 INHALATIONS FROM ONE CAPSULE DAILY AS DIRECTED Patient taking differently: Place 18 mcg into inhaler and inhale. USE 2 INHALATIONS FROM ONE CAPSULE DAILY AS DIRECTED 03/06/16  Yes Noralee Space, MD  traMADol (ULTRAM) 50 MG tablet TAKE 1 TABLET BY MOUTH THREE TIMES DAILY AS NEEDED FOR PAIN Patient taking differently: TAKE 1 TABLET BY MOUTH once DAILY AS NEEDED FOR PAIN 03/15/16  Yes Noralee Space, MD  warfarin (COUMADIN) 2.5 MG tablet TAKE AS DIRECTED BY ANTICOAGULATION CLINIC Patient taking differently: TAKE 1.25 mg (1/2 tablet) every morning as [place] 09/02/15  Yes Josue Hector, MD  ibuprofen (ADVIL,MOTRIN) 200 MG tablet Take 200 mg by mouth every 6 (six) hours as needed for fever, headache or moderate pain.     Historical Provider, MD    Physical Exam: Vitals:   06/17/16 2000 06/17/16 2003 06/17/16 2010 06/17/16 2015  BP: 99/58 (!) 94/53 108/78 (!) 97/41  Pulse: 85 62 78 (!) 58  Resp: '21 19 26 17  '$ Temp:      TempSrc:      SpO2: 100% 91% 100% 98%      Constitutional: NAD, calm, comfortable, elderly, frail Eyes: PERRL, lids and conjunctivae normal ENMT: Mucous membranes are moist. Posterior pharynx clear of any exudate or lesions.Normal dentition.  Neck: normal,  supple, no masses, no thyromegaly Respiratory: clear to auscultation bilaterally, no wheezing, no crackles. Normal respiratory effort. No accessory muscle use.  Cardiovascular: Regular rate and rhythm, no murmurs / rubs / gallops. No extremity edema. 2+ pedal pulses. No carotid bruits.  Abdomen: no tenderness. No hepatosplenomegaly. Bowel sounds positive.  Musculoskeletal: no clubbing / cyanosis. No joint deformity upper and lower extremities. Good ROM, no contractures. Normal muscle tone.  Skin: diffuse bruising and skin changes from chronic coumadin Neurologic: CN 2-12 grossly intact. Sensation intact, DTR normal. Strength 5/5 in all 4.  Psychiatric: oriented to person and  place   Labs on Admission: I have personally reviewed following labs and imaging studies  CBC:  Recent Labs Lab 06/17/16 1730  WBC 9.5  NEUTROABS 7.5  HGB 8.1*  HCT 23.6*  MCV 92.5  PLT [id number]*   Basic Metabolic Panel:  Recent Labs Lab 06/17/16 1730  NA 137  K 3.3*  CL 113*  CO2 12*  GLUCOSE 105*  BUN 141*  CREATININE 5.39*  CALCIUM 8.1*   GFR: CrCl cannot be calculated (Unknown ideal weight.). Liver Function Tests:  Recent Labs Lab 06/17/16 1730  AST 20  ALT 17  ALKPHOS 93  BILITOT 1.1  PROT 4.9*  ALBUMIN 2.5*   No results for input(s): LIPASE, AMYLASE in the last 168 hours. No results for input(s): AMMONIA in the last 168 hours. Coagulation Profile:  Recent Labs Lab 06/13/16 1219 06/13/16 1225 06/17/16 1730  INR 7.3 6.7* 3.15   Cardiac Enzymes:  Recent Labs Lab 06/17/16 1730  TROPONINI 0.04*   BNP (last 3 results)  Recent Labs  12/14/15 1219  PROBNP 462.0*   HbA1C: No results for input(s): HGBA1C in the last 72 hours. CBG: No results for input(s): GLUCAP in the last 168 hours. Lipid Profile: No results for input(s): CHOL, HDL, LDLCALC, TRIG, CHOLHDL, LDLDIRECT in the last 72 hours. Thyroid Function Tests: No results for input(s): TSH, T4TOTAL, FREET4, T3FREE,  THYROIDAB in the last 72 hours. Anemia Panel: No results for input(s): VITAMINB12, FOLATE, FERRITIN, TIBC, IRON, RETICCTPCT in the last 72 hours. Urine analysis:    Component Value Date/Time   COLORURINE YELLOW 06/17/2016 1700   APPEARANCEUR CLOUDY (A) 06/17/2016 1700   LABSPEC 1.012 06/17/2016 1700   PHURINE 5.5 06/17/2016 1700   GLUCOSEU NEGATIVE 06/17/2016 1700   GLUCOSEU NEGATIVE 10/25/2009 1134   HGBUR MODERATE (A) 06/17/2016 1700   BILIRUBINUR NEGATIVE 06/17/2016 1700   KETONESUR NEGATIVE 06/17/2016 1700   PROTEINUR 30 (A) 06/17/2016 1700   UROBILINOGEN 0.2 04/16/2012 1810   NITRITE NEGATIVE 06/17/2016 1700   LEUKOCYTESUR LARGE (A) 06/17/2016 1700   Sepsis Labs: '@LABRCNTIP'$ (procalcitonin:4,lacticidven:4) )No results found for this or any previous visit (from the past 240 hour(s)).   Radiological Exams on Admission: No results found.  EKG: Independently reviewed.  Assessment/Plan Principal Problem:   Melena Active Problems:   Essential hypertension   Atrial fibrillation (HCC)   COPD (chronic obstructive pulmonary disease) with chronic bronchitis (HCC)   Acute blood loss anemia   Chronic systolic congestive heart failure (HCC)   Acute kidney failure (HCC)   UTI (lower urinary tract infection)   GI bleed   Metabolic acidosis, NAG, bicarbonate losses   CKD (chronic kidney disease) stage 3, GFR 30-59 ml/min    1. Melena with acute blood loss anemia - 1. 2 units PRBC transfusing 2. 3L NS already in for hypotension, BP improved 3. Dr. Michail Sermon has seen in ED, note in chart 4. CT abd/pelvis w/o contrast (due to kidney) ordered to take a look at his TAAA and EVAR graft and see if there is any gross involvement with the GIB 5. PPI GTT 2. AKF on CKD stage 3 - 1. Repeat BMP in AM 2. Fluid resuscitation as above 3. Likely warrants nephrology consult in AM... 4. But family is leaning away from him wanting dialysis anyhow, if this is the case and renal function does not  improve, then likely warrants palliative care consult. 3. NAG metabolic acidosis - 1. Switching maint fluids to bicarb gtt 2. Repeat BMP in AM 3. Suspicious that his renal  failure may be more sub-acute 4. A.Fib on coumadin- 1. Reversing coumadin, reversing with Vit K 2. Repeat INR in AM 3. Holding toprol due to BP, switch to short acting rate control gtt if he goes into RVR 5. UTI - rocephin, cultures pending 6. HTN - hold BP meds 7. COPD - continue home nebs 8. Chronic systolic CHF - watch for signs of fluid overload with resuscitation as above   DVT prophylaxis: SCDs Code Status: DNR Family Communication: At bedside Consults called: Dr. Michail Sermon has seen in ED Admission status: Admit to inpatient   Etta Quill DO [facility] Pager [phone] from 7PM-7AM  If 7AM-7PM, please contact the day physician for the patient [email or web address] Password [facility]  06/17/2016, 8:41 PM

## 2016-06-17 NOTE — Consult Note (Signed)
Referring Provider: Dr. Zenia Resides Primary Care Physician:  Noralee Space, MD Primary Gastroenterologist:  Althia Forts  Reason for Consultation:  Melena  HPI: Ryan Hess is a 82 y.o. male with 3 weeks of weakness and one week of black stools without any history of nausea, vomiting, or abdominal pain. History is obtained from daughter. Patient is oriented to person and place. On chronic Coumadin for Afib and INR 3.15. Reported history of diverticulosis and colon polyps. Hgb 8.1 (14.6 in March 2017). Hypotensive in 70's/40's during my evaluation and Dr. Alcario Drought at bedside as well.   Past Medical History:  Diagnosis Date  . Actinic skin damage   . Atrial fibrillation ([place])   . CHF (congestive heart failure) ([place])   . Colon polyps   . COPD (chronic obstructive pulmonary disease) ([place])   . Diverticulosis of colon   . DJD (degenerative joint disease)   . Drug therapy   . HTN (hypertension)   . Hypercholesteremia   . Hyperlipidemia   . Internal hemorrhoids   . Pneumonia, organism unspecified   . Prostate cancer ([place])   . Pulmonary nodule   . PVC (premature ventricular contraction)   . PVD (peripheral vascular disease) ([place])   . Thoracic aortic aneurysm [facility])     Past Surgical History:  Procedure Laterality Date  . ABDOMINAL AORTIC ANEURYSM REPAIR  1998  . APPENDECTOMY    . APPLICATION OF WOUND VAC  04/18/2012   Procedure: APPLICATION OF WOUND VAC;  Surgeon: Joyice Faster. Cornett, MD;  Location: [place];  Service: General;  Laterality: N/A;  Removal of abdominal wound vac, Application of incisional  wound vac  . INGUINAL HERNIA REPAIR     left  . JOINT REPLACEMENT    . LAPAROTOMY  04/16/2012   Procedure: EXPLORATORY LAPAROTOMY;  Surgeon: Madilyn Hook, DO;  Location: [place];  Service: General;  Laterality: N/A;  exploratory Laparotomy,Small bowel ressection abdominal wound vac placement.  [name] LAPAROTOMY  04/18/2012   Procedure: EXPLORATORY LAPAROTOMY;  Surgeon: Joyice Faster. Cornett, MD;  Location:  [place];  Service: General;  Laterality: N/A;  exploratory laparotomy with small bowel anastomosis  . repair of AA  04/14/2013   done at [place]  . TOTAL KNEE ARTHROPLASTY     bilateral    Prior to Admission medications   Medication Sig Start Date End Date Taking? Authorizing Provider  aspirin 81 MG tablet Take 81 mg by mouth every morning.    Yes Historical Provider, MD  atorvastatin (LIPITOR) 10 MG tablet TAKE 1 TABLET BY MOUTH DAILY Patient taking differently: TAKE 1 TABLET (10 mg) BY MOUTH DAILY 03/22/16  Yes Noralee Space, MD  Fluticasone-Salmeterol (ADVAIR DISKUS) 250-50 MCG/DOSE AEPB inhale 1 dose by mouth twice a day Patient taking differently: Inhale 1 puff into the lungs 2 (two) times daily.  02/15/16  Yes Noralee Space, MD  furosemide (LASIX) 40 MG tablet TAKE 1 TABLET BY MOUTH DAILY Patient taking differently: TAKE 1 TABLET (40 mg) BY MOUTH DAILY 05/08/16  Yes Noralee Space, MD  guaiFENesin (MUCINEX) 600 MG 12 hr tablet Take 600 mg by mouth 2 (two) times daily. COPD.   Yes Historical Provider, MD  levothyroxine (SYNTHROID, LEVOTHROID) 50 MCG tablet TAKE 1 TABLET BY MOUTH DAILY Patient taking differently: TAKE 1 TABLET (50 mcg) BY MOUTH DAILY 03/27/16  Yes Noralee Space, MD  losartan (COZAAR) 100 MG tablet TAKE 1 TABLET BY MOUTH EVERY DAY Patient taking differently: TAKE 1 TABLET (100 mg) BY MOUTH EVERY DAY  12/17/15  Yes Noralee Space, MD  metoprolol succinate (TOPROL XL) 25 MG 24 hr tablet Take 1 tablet (25 mg total) by mouth daily. 05/23/16  Yes Josue Hector, MD  Multiple Vitamin (MULTIVITAMIN) tablet Take 1 tablet by mouth daily.     Yes Historical Provider, MD  tiotropium (SPIRIVA HANDIHALER) 18 MCG inhalation capsule USE 2 INHALATIONS FROM ONE CAPSULE DAILY AS DIRECTED Patient taking differently: Place 18 mcg into inhaler and inhale. USE 2 INHALATIONS FROM ONE CAPSULE DAILY AS DIRECTED 03/06/16  Yes Noralee Space, MD  traMADol (ULTRAM) 50 MG tablet TAKE 1 TABLET BY MOUTH THREE  TIMES DAILY AS NEEDED FOR PAIN Patient taking differently: TAKE 1 TABLET BY MOUTH once DAILY AS NEEDED FOR PAIN 03/15/16  Yes Noralee Space, MD  warfarin (COUMADIN) 2.5 MG tablet TAKE AS DIRECTED BY ANTICOAGULATION CLINIC Patient taking differently: TAKE 1.25 mg (1/2 tablet) every morning as [place] 09/02/15  Yes Josue Hector, MD  ibuprofen (ADVIL,MOTRIN) 200 MG tablet Take 200 mg by mouth every 6 (six) hours as needed for fever, headache or moderate pain.     Historical Provider, MD    Scheduled Meds: . [START ON 06/18/2016] atorvastatin  10 mg Oral q1800  . fluticasone furoate-vilanterol  1 puff Inhalation Daily  . [START ON 06/18/2016] levothyroxine  50 mcg Oral QAC breakfast  . tiotropium  18 mcg Inhalation Daily   Continuous Infusions: . sodium chloride 125 mL/hr at 06/17/16 1707  . cefTRIAXone (ROCEPHIN)  IV    . phytonadione (VITAMIN K) IV     PRN Meds:.  Allergies as of 06/17/2016  . (No Known Allergies)    Family History  Problem Relation Age of Onset  . Dementia Mother   . Stroke Father   . Diabetes Brother   . Heart disease Brother   . Heart disease Sister   . Heart disease Brother   . Obesity Brother   . Cancer Sister     back ??  . Alzheimer's disease Sister   . Colon cancer Neg Hx     Social History   Social History  . Marital status: Widowed    Spouse name: N/A  . Number of children: 3  . Years of education: N/A   Occupational History  . retired Administrator, [employer] Retired  . [employer] veteran    Social History Main Topics  . Smoking status: Former Smoker    Packs/day: 2.00    Years: 44.00    Types: Cigarettes    Quit date: 05/15/1982  . Smokeless tobacco: Never Used  . Alcohol use No  . Drug use: No  . Sexual activity: Not on file   Other Topics Concern  . Not on file   Social History Narrative  . No narrative on file    Review of Systems: All negative except as stated above in HPI.  Physical Exam: Vital  signs: Vitals:   06/17/16 2003 06/17/16 2010  BP: (!) 94/53 108/78  Pulse: 62 78  Resp: 19 26  Temp:    T 97.6   General:  Lethargic, elderly, frail, Well-developed, well-nourished, pleasant and cooperative in NAD HEENT: anicteric sclera, oropharynx limited view Neck: supple, nontender Lungs:  Clear throughout to auscultation.   No wheezes, crackles, or rhonchi. No acute distress. Heart:  Regular rate and rhythm; no murmurs, clicks, rubs,  or gallops. Abdomen: soft, nontender, nondistended, +BS  Rectal:  Deferred Ext: no edema  GI:  Lab Results:  Recent  Labs  06/17/16 1730  WBC 9.5  HGB 8.1*  HCT 23.6*  PLT 134*   BMET  Recent Labs  06/17/16 1730  NA 137  K 3.3*  CL 113*  CO2 12*  GLUCOSE 105*  BUN 141*  CREATININE 5.39*  CALCIUM 8.1*   LFT  Recent Labs  06/17/16 1730  PROT 4.9*  ALBUMIN 2.5*  AST 20  ALT 17  ALKPHOS 93  BILITOT 1.1   PT/INR  Recent Labs  06/17/16 1730  LABPROT 33.1*  INR 3.15     Studies/Results: No results found.  Impression/Plan: 82 yo with one week of melena in the setting of chronic Coumadin and progressive weakness. No evidence of ongoing bleeding. Agree with correcting INR and holding anticoagulation. Protonix infusion. If melena persisting after correction of INR, then may need an EGD to further evaluate but for now would recommend conservative management. Dr. Alcario Drought planning to do a CT due to his large thoracic aneurysm. Clear liquid diet and NPO after midnight in case EGD needed. Will follow.    LOS: 0 days   Lydia C.  06/17/2016, 8:21 PM  Pager [phone]  If no answer or after 5 PM call [phone]

## 2016-06-18 ENCOUNTER — Inpatient Hospital Stay (HOSPITAL_COMMUNITY): Payer: Medicare Other

## 2016-06-18 ENCOUNTER — Encounter (HOSPITAL_COMMUNITY): Payer: Self-pay | Admitting: *Deleted

## 2016-06-18 DIAGNOSIS — L8990 Pressure ulcer of unspecified site, unspecified stage: Secondary | ICD-10-CM | POA: Diagnosis present

## 2016-06-18 LAB — CBC
HCT: 31.1 % — ABNORMAL LOW (ref 39.0–52.0)
Hemoglobin: 10.5 g/dL — ABNORMAL LOW (ref 13.0–17.0)
MCH: 30.9 pg (ref 26.0–34.0)
MCHC: 33.8 g/dL (ref 30.0–36.0)
MCV: 91.5 fL (ref 78.0–100.0)
Platelets: 122 10*3/uL — ABNORMAL LOW (ref 150–400)
RBC: 3.4 MIL/uL — ABNORMAL LOW (ref 4.22–5.81)
RDW: 14.6 % (ref 11.5–15.5)
WBC: 9.6 10*3/uL (ref 4.0–10.5)

## 2016-06-18 LAB — MRSA PCR SCREENING: MRSA by PCR: NEGATIVE

## 2016-06-18 LAB — BASIC METABOLIC PANEL
Anion gap: 14 (ref 5–15)
BUN: 124 mg/dL — ABNORMAL HIGH (ref 6–20)
CO2: 11 mmol/L — ABNORMAL LOW (ref 22–32)
Calcium: 8 mg/dL — ABNORMAL LOW (ref 8.9–10.3)
Chloride: 115 mmol/L — ABNORMAL HIGH (ref 101–111)
Creatinine, Ser: 4.84 mg/dL — ABNORMAL HIGH (ref 0.61–1.24)
GFR calc Af Amer: 11 mL/min — ABNORMAL LOW (ref >60)
GFR calc non Af Amer: 10 mL/min — ABNORMAL LOW (ref >60)
Glucose, Bld: 94 mg/dL (ref 65–99)
Potassium: 3.4 mmol/L — ABNORMAL LOW (ref 3.5–5.1)
Sodium: 140 mmol/L (ref 135–145)

## 2016-06-18 LAB — TYPE AND SCREEN
ABO/RH(D): O POS
Antibody Screen: NEGATIVE
Unit division: 0
Unit division: 0

## 2016-06-18 LAB — URINALYSIS, ROUTINE W REFLEX MICROSCOPIC
Bilirubin Urine: NEGATIVE
Glucose, UA: NEGATIVE mg/dL
Ketones, ur: 15 mg/dL — AB
Nitrite: POSITIVE — AB
Protein, ur: 100 mg/dL — AB
Specific Gravity, Urine: 1.02 (ref 1.005–1.030)
pH: 5.5 (ref 5.0–8.0)

## 2016-06-18 LAB — URINE MICROSCOPIC-ADD ON

## 2016-06-18 LAB — PROTIME-INR
INR: 2.04
Prothrombin Time: 23.4 seconds — ABNORMAL HIGH (ref 11.4–15.2)

## 2016-06-18 LAB — HEMOGLOBIN AND HEMATOCRIT, BLOOD
HCT: 31.1 % — ABNORMAL LOW (ref 39.0–52.0)
Hemoglobin: 10.4 g/dL — ABNORMAL LOW (ref 13.0–17.0)

## 2016-06-18 MED ORDER — LACTATED RINGERS IV SOLN
INTRAVENOUS | Status: AC
  Administered 2016-06-18 – 2016-06-19 (×2): via INTRAVENOUS

## 2016-06-18 MED ORDER — METOPROLOL TARTRATE 12.5 MG HALF TABLET
12.5000 mg | ORAL_TABLET | Freq: Two times a day (BID) | ORAL | Status: DC
  Administered 2016-06-18 – 2016-06-26 (×16): 12.5 mg via ORAL
  Filled 2016-06-18 (×17): qty 1

## 2016-06-18 MED ORDER — FAMOTIDINE IN NACL 20-0.9 MG/50ML-% IV SOLN
20.0000 mg | INTRAVENOUS | Status: DC
  Administered 2016-06-18 – 2016-06-21 (×4): 20 mg via INTRAVENOUS
  Filled 2016-06-18 (×5): qty 50

## 2016-06-18 MED ORDER — SODIUM BICARBONATE 8.4 % IV SOLN
50.0000 meq | Freq: Once | INTRAVENOUS | Status: AC
  Administered 2016-06-18: 50 meq via INTRAVENOUS
  Filled 2016-06-18: qty 50

## 2016-06-18 MED ORDER — ORAL CARE MOUTH RINSE
15.0000 mL | Freq: Two times a day (BID) | OROMUCOSAL | Status: DC
  Administered 2016-06-18 – 2016-06-26 (×12): 15 mL via OROMUCOSAL

## 2016-06-18 MED ORDER — SODIUM BICARBONATE 650 MG PO TABS
650.0000 mg | ORAL_TABLET | Freq: Four times a day (QID) | ORAL | Status: DC
  Administered 2016-06-18 – 2016-06-21 (×12): 650 mg via ORAL
  Filled 2016-06-18 (×12): qty 1

## 2016-06-18 MED ORDER — POTASSIUM CHLORIDE CRYS ER 20 MEQ PO TBCR
20.0000 meq | EXTENDED_RELEASE_TABLET | Freq: Once | ORAL | Status: AC
  Administered 2016-06-18: 20 meq via ORAL
  Filled 2016-06-18: qty 1

## 2016-06-18 NOTE — Progress Notes (Signed)
Pt urine looks like milk - this is a change in condition.  notified Dr Erlinda Hong per text page - UA sent of sample

## 2016-06-18 NOTE — Progress Notes (Signed)
Central monitoring called with frequent 5 runs of PVCs and 3 beats of wide QRS VT. Dr. Alcario Drought made aware. Orders to replace potassium placed, will continue to monitor.

## 2016-06-18 NOTE — Progress Notes (Signed)
PROGRESS NOTE  DAVID BALDI [id number] DOB: 1923/10/01 DOA: 06/17/2016 PCP: Noralee Space, MD  HPI/Recap of past 24 hours:  Feeling better, blood pressure has much improved  Assessment/Plan: Principal Problem:   Melena Active Problems:   Essential hypertension   Atrial fibrillation (HCC)   COPD (chronic obstructive pulmonary disease) with chronic bronchitis (HCC)   Acute blood loss anemia   Chronic systolic congestive heart failure (HCC)   Acute kidney failure (HCC)   UTI (lower urinary tract infection)   GI bleed   Metabolic acidosis, NAG, bicarbonate losses   CKD (chronic kidney disease) stage 3, GFR 30-59 ml/min  Melena with acute blood loss anemia:  S/p prbc transfusion x2 units, hgb with appropriate improvement  on ppi, now started on clears, coumadin held, need GI guidance on when to resume anticoagulation  GI Dr Michail Sermon input appreciated,  AKI on CKDIII, UTI cr on admission is 5.39, baseline around 2, aki likely due to uti/dehydration and gi bleed, urine culture pending, on abx, home meds lasix held, on gentle hydration, will get renal US  Family report he is followed by urology Dr rein, they were told his urine always has some bacteria in it.  Metabolic acidosis likely from AKI: was on bicarb drip, changed to oral supplement  Chronic afib,  rate controlled, betablocker held initially due to hypotension. Now blood pressure better, restart low dose lopressor with holding parameters.   on coumadin at home which is held since admission due to gi bleed, will need GI guidance on when to resume anticoagulation  Hypotension/shock: (hypovolemic shock +/- septic shock, responded to ivf/prbc /abx , and holding blood pressure meds, did not need pressors or stress dose steroids) Initial blood pressure 67/55  likely combination of gi bleed, infection and dehydration,  Has received ivf/prbc and abx, improving Restart low dose betablocker with holding parameters, continue  hold other home bp meds  H/o systolic chf, last EF in 123456 45-50% Currently dehydrated, lasix held, on gentle hydration, watch volume status closely  COPD: no wheezing, continue home meds  Dementia: no oriented to time or place  Code Status: DNR  Family Communication: patient and his son and daughter  Disposition Plan: remain in stepdown   Consultants:  GI  Procedures:  prbc transfusion x2units on 9/23-24night  Antibiotics:  rocephin   Objective: BP 128/65   Pulse (!) 27   Temp 97.7 F (36.5 C) (Oral)   Resp 18   Ht 5\' 11"  (1.803 m)   Wt 74.7 kg (164 lb 11.2 oz)   SpO2 99%   BMI 22.97 kg/m   Intake/Output Summary (Last 24 hours) at 06/18/16 1044 Last data filed at 06/18/16 0700  Gross per 24 hour  Intake          4162.08 ml  Output               75 ml  Net          4087.08 ml   Filed Weights   06/18/16 0145  Weight: 74.7 kg (164 lb 11.2 oz)    Exam:   General:  Frail, oriented to person only, pleasant and following commands  Cardiovascular: IRRR  Respiratory: CTABL  Abdomen: Soft/ND/NT, positive BS, mild line well healed surgical scar from prior AAA surgery and bowel resection surgery  Musculoskeletal: No Edema  Neuro: oriented to person only, pleasant and following commands  Data Reviewed: Basic Metabolic Panel:  Recent Labs Lab 06/17/16 1730 06/18/16 0152  NA 137 140  K 3.3* 3.4*  CL 113* 115*  CO2 12* 11*  GLUCOSE 105* 94  BUN 141* 124*  CREATININE 5.39* 4.84*  CALCIUM 8.1* 8.0*   Liver Function Tests:  Recent Labs Lab 06/17/16 1730  AST 20  ALT 17  ALKPHOS 93  BILITOT 1.1  PROT 4.9*  ALBUMIN 2.5*   No results for input(s): LIPASE, AMYLASE in the last 168 hours. No results for input(s): AMMONIA in the last 168 hours. CBC:  Recent Labs Lab 06/17/16 1730 06/18/16 0152  WBC 9.5 9.6  NEUTROABS 7.5  --   HGB 8.1* 10.4*  10.5*  HCT 23.6* 31.1*  31.1*  MCV 92.5 91.5  PLT 134* 122*   Cardiac Enzymes:     Recent Labs Lab 06/17/16 1730  TROPONINI 0.04*   BNP (last 3 results) No results for input(s): BNP in the last 8760 hours.  ProBNP (last 3 results)  Recent Labs  12/14/15 1219  PROBNP 462.0*    CBG: No results for input(s): GLUCAP in the last 168 hours.  Recent Results (from the past 240 hour(s))  MRSA PCR Screening     Status: None   Collection Time: 06/18/16  1:44 AM  Result Value Ref Range Status   MRSA by PCR NEGATIVE NEGATIVE Final    Comment:        The GeneXpert MRSA Assay (FDA approved for NASAL specimens only), is one component of a comprehensive MRSA colonization surveillance program. It is not intended to diagnose MRSA infection nor to guide or monitor treatment for MRSA infections.      Studies: Ct Abdomen Pelvis Wo Contrast  Result Date: 06/17/2016 CLINICAL DATA:  Fatigue and weakness for several weeks with black stools. History of thoracoabdominal aortic aneurysm repair in July 2014. EXAM: CT ABDOMEN AND PELVIS WITHOUT CONTRAST TECHNIQUE: Multidetector CT imaging of the abdomen and pelvis was performed following the standard protocol without IV contrast. COMPARISON:  Outside CT abdomen/pelvis from 05/19/2015. FINDINGS: Lower chest: Patchy subpleural reticulation at both lung bases. Centrilobular emphysema at the lung bases. Partially visualized bandlike focus of consolidation in the posterior right lower lobe is not definitely changed. Coronary atherosclerosis. Fluid is seen in the lower thoracic esophagus. Hepatobiliary: Diffuse hepatic steatosis. No liver mass. No appreciable cholelithiasis. A subcentimeter coarse calcification at the gallbladder fossa is favored to represent a calcified liver granuloma and is unchanged. No definite gallbladder wall thickening or pericholecystic fluid. No biliary ductal dilatation. Pancreas: Diffuse fatty infiltration of the otherwise unremarkable pancreas, with no mass or duct dilation. Spleen: Normal size. No mass.  Adrenals/Urinary Tract: Normal adrenals. No right hydronephrosis. Fullness of the left renal collecting system without overt left hydronephrosis. Punctate nonobstructing 2 mm stone in the lower left kidney. No right renal stones. Simple 4.1 cm renal cyst in the anterior upper right kidney. Subcentimeter hypodense renal cortical lesions in the interpolar left kidney, too small to characterize, for which no further follow-up is required. Stable diffuse bladder wall thickening with mild bladder distention. No bladder stones. Stomach/Bowel: Grossly normal stomach. Normal caliber small bowel with no small bowel wall thickening. Appendectomy . Mild sigmoid diverticulosis, with no large bowel wall thickening or pericolonic fat stranding. Mild-to-moderate colorectal stool volume. Vascular/Lymphatic: Aortic atherosclerosis. There is a complex thoracoabdominal aortic aneurysm status post partially visualized aorto bi-iliac stent graft, with maximum aortic aneurysm diameter 7.0 cm, not appreciably changed from 05/19/2015. Celiac trunk, superior mesenteric artery and bilateral renal artery stents are in place. No acute para-aortic fluid collections. No pathologically enlarged lymph nodes  in the abdomen or pelvis. Reproductive: Normal size prostate. Fiducial markers are seen in the prostate. Other: No pneumoperitoneum, ascites or focal fluid collection. Musculoskeletal: No aggressive appearing focal osseous lesions. Moderate thoracolumbar spondylosis. Stable moderate to severe L4 vertebral compression fracture. IMPRESSION: 1. No acute abnormality. No evidence of bowel obstruction or acute bowel inflammation. Mild sigmoid diverticulosis, with no evidence of acute diverticulitis. 2. Stable noncontrast CT appearance of the visualized portions of the complex thoracoabdominal aneurysm status post aortobi-iliac stent graft, measuring 7.0 cm in maximum diameter. 3. Stable chronic diffuse bladder wall thickening with mild bladder  distention. 4. Additional findings include aortic atherosclerosis, chronic L4 vertebral compression fracture, coronary atherosclerosis, centrilobular emphysema at the lung bases, punctate nonobstructing left renal stone and diffuse hepatic steatosis. Electronically Signed   By: Ilona Sorrel M.D.   On: 06/17/2016 23:13    Scheduled Meds: . atorvastatin  10 mg Oral q1800  . cefTRIAXone (ROCEPHIN)  IV  1 g Intravenous Q24H  . famotidine (PEPCID) IV  20 mg Intravenous Q24H  . fluticasone furoate-vilanterol  1 puff Inhalation Daily  . levothyroxine  50 mcg Oral QAC breakfast  . mouth rinse  15 mL Mouth Rinse BID  . sodium bicarbonate  50 mEq Intravenous Once  . sodium bicarbonate  650 mg Oral QID  . tiotropium  18 mcg Inhalation Daily    Continuous Infusions: . lactated ringers       Time spent: 65mins  Infant Doane MD, PhD  [facility] Pager [phone]. If 7PM-7AM, please contact night-coverage at [email or web address], password [facility] 06/18/2016, 10:44 AM  LOS: 1 day

## 2016-06-18 NOTE — Progress Notes (Signed)
[facility] Gastroenterology Progress Note  Ryan Hess 82 y.o. April 02, 1924   Subjective: More alert today. No bleeding overnight. Family at bedside.  Objective: Vital signs in last 24 hours: Vitals:   06/18/16 0600 06/18/16 0800  BP: 129/75 128/65  Pulse: 81 (!) 27  Resp: 18 18  Temp:  97.7 F (36.5 C)    Physical Exam: Gen: alert, elderly, frail, no acute distress HEENT: anicteric sclera CV: RRR Chest: CTA B Abd: soft, nontender, nondistended, +BS  Lab Results:  Recent Labs  06/17/16 1730 06/18/16 0152  NA 137 140  K 3.3* 3.4*  CL 113* 115*  CO2 12* 11*  GLUCOSE 105* 94  BUN 141* 124*  CREATININE 5.39* 4.84*  CALCIUM 8.1* 8.0*    Recent Labs  06/17/16 1730  AST 20  ALT 17  ALKPHOS 93  BILITOT 1.1  PROT 4.9*  ALBUMIN 2.5*    Recent Labs  06/17/16 1730 06/18/16 0152  WBC 9.5 9.6  NEUTROABS 7.5  --   HGB 8.1* 10.4*  10.5*  HCT 23.6* 31.1*  31.1*  MCV 92.5 91.5  PLT 134* 122*    Recent Labs  06/17/16 1730 06/18/16 0152  LABPROT 33.1* 23.4*  INR 3.15 2.04      Assessment/Plan: S/P GI bleed in the setting of chronic Coumadin. Hypotension resolved with volume resuscitation and 2 U blood transfusion with Hgb 10.5. No further bleeding. Clear liquid diet. D/C Protonix infusion. Supportive care. Will sign off. Call if questions.   [place] C. 06/18/2016, 10:19 AM  Pager [phone]  If no answer or after 5 PM call [phone] ID: Ryan Hess, male   DOB: 1924/08/29, 82 y.o.   MRN: [id number]

## 2016-06-19 LAB — CBC
HCT: 27.4 % — ABNORMAL LOW (ref 39.0–52.0)
Hemoglobin: 9.4 g/dL — ABNORMAL LOW (ref 13.0–17.0)
MCH: 30.9 pg (ref 26.0–34.0)
MCHC: 34.3 g/dL (ref 30.0–36.0)
MCV: 90.1 fL (ref 78.0–100.0)
Platelets: 95 10*3/uL — ABNORMAL LOW (ref 150–400)
RBC: 3.04 MIL/uL — ABNORMAL LOW (ref 4.22–5.81)
RDW: 15.3 % (ref 11.5–15.5)
WBC: 8.6 10*3/uL (ref 4.0–10.5)

## 2016-06-19 LAB — URINE CULTURE: Culture: 50000 — AB

## 2016-06-19 LAB — BASIC METABOLIC PANEL
Anion gap: 9 (ref 5–15)
BUN: 111 mg/dL — ABNORMAL HIGH (ref 6–20)
CO2: 17 mmol/L — ABNORMAL LOW (ref 22–32)
Calcium: 7.9 mg/dL — ABNORMAL LOW (ref 8.9–10.3)
Chloride: 115 mmol/L — ABNORMAL HIGH (ref 101–111)
Creatinine, Ser: 4.63 mg/dL — ABNORMAL HIGH (ref 0.61–1.24)
GFR calc Af Amer: 12 mL/min — ABNORMAL LOW (ref >60)
GFR calc non Af Amer: 10 mL/min — ABNORMAL LOW (ref >60)
Glucose, Bld: 94 mg/dL (ref 65–99)
Potassium: 2.8 mmol/L — ABNORMAL LOW (ref 3.5–5.1)
Sodium: 141 mmol/L (ref 135–145)

## 2016-06-19 LAB — TSH: TSH: 5.31 u[IU]/mL — ABNORMAL HIGH (ref 0.350–4.500)

## 2016-06-19 LAB — PROTIME-INR
INR: 1.38
Prothrombin Time: 17.1 seconds — ABNORMAL HIGH (ref 11.4–15.2)

## 2016-06-19 MED ORDER — CEFAZOLIN IN D5W 1 GM/50ML IV SOLN
1.0000 g | Freq: Two times a day (BID) | INTRAVENOUS | Status: DC
  Administered 2016-06-19 – 2016-06-25 (×12): 1 g via INTRAVENOUS
  Filled 2016-06-19 (×14): qty 50

## 2016-06-19 MED ORDER — POTASSIUM CHLORIDE CRYS ER 20 MEQ PO TBCR
40.0000 meq | EXTENDED_RELEASE_TABLET | Freq: Three times a day (TID) | ORAL | Status: DC
  Administered 2016-06-19 – 2016-06-20 (×7): 40 meq via ORAL
  Filled 2016-06-19 (×7): qty 2

## 2016-06-19 NOTE — Evaluation (Signed)
Clinical/Bedside Swallow Evaluation Patient Details  Name: Ryan Hess MRN: [id number] Date of Birth: [birth year]  Today's Date: 06/19/2016 Time: SLP Start Time (ACUTE ONLY): 0914 SLP Stop Time (ACUTE ONLY): 0930 SLP Time Calculation (min) (ACUTE ONLY): 16 min  Past Medical History:  Past Medical History:  Diagnosis Date  . Actinic skin damage   . Atrial fibrillation ([place])   . CHF (congestive heart failure) ([place])   . Colon polyps   . COPD (chronic obstructive pulmonary disease) ([place])   . Diverticulosis of colon   . DJD (degenerative joint disease)   . Drug therapy   . HTN (hypertension)   . Hypercholesteremia   . Hyperlipidemia   . Internal hemorrhoids   . Pneumonia, organism unspecified   . Prostate cancer ([place])   . Pulmonary nodule   . PVC (premature ventricular contraction)   . PVD (peripheral vascular disease) ([place])   . Thoracic aortic aneurysm [facility])    Past Surgical History:  Past Surgical History:  Procedure Laterality Date  . ABDOMINAL AORTIC ANEURYSM REPAIR  1998  . APPENDECTOMY    . APPLICATION OF WOUND VAC  04/18/2012   Procedure: APPLICATION OF WOUND VAC;  Surgeon: Joyice Faster. Cornett, MD;  Location: [place];  Service: General;  Laterality: N/A;  Removal of abdominal wound vac, Application of incisional  wound vac  . INGUINAL HERNIA REPAIR     left  . JOINT REPLACEMENT    . LAPAROTOMY  04/16/2012   Procedure: EXPLORATORY LAPAROTOMY;  Surgeon: Madilyn Hook, DO;  Location: [place];  Service: General;  Laterality: N/A;  exploratory Laparotomy,Small bowel ressection abdominal wound vac placement.  [name] LAPAROTOMY  04/18/2012   Procedure: EXPLORATORY LAPAROTOMY;  Surgeon: Joyice Faster. Cornett, MD;  Location: [facility];  Service: General;  Laterality: N/A;  exploratory laparotomy with small bowel anastomosis  . repair of AA  04/14/2013   done at [place]  . TOTAL KNEE ARTHROPLASTY     bilateral   HPI:  Ryan Hess a 82 y.o.malewith medical history significant of  complex TAAA s/p [facility]AR at [facility], CKD stage 3, A.Fib on coumadin, HTN, COPD, PNA, esophageal dysmotility/presbyesophagus. Patient presents to the ED with 3 weeks of intermittent melena that became consistent melena in the three days or so prior to admission, and difficulty walking.  No h/o N/V or abdominal pain. No fever, no cough, no congestion.Weakness is generalized without specific neurologic symptoms. Note CT abdomen 9/23 showed flud in lower thoracic esophagus.    Assessment / Plan / Recommendation Clinical Impression  Evaluation complete. Patient presents with a functional oropharyngeal swallow without evidence of an oropharyngeal dysphagia. H/o esophageal dysmotility increases risk of aspiration. Education complete with patient and family regarding esophageal precautions, primarily remaining upright for 30 min-1 hour after pos to facilitate esophageal clearance and decrease aspiration risk. Verbalized understanding. No SLP f/u indicated. Patient may resume a regular diet from an SLP standpoint, defer to GI MD for upgrade when appropriate.     Aspiration Risk  Mild aspiration risk    Diet Recommendation Regular;Thin liquid   Liquid Administration via: Cup;Straw Medication Administration: Whole meds with liquid Supervision: Patient able to self feed;Full supervision/cueing for compensatory strategies Compensations: Slow rate;Small sips/bites;Follow solids with liquid Postural Changes: Remain upright for at least 30 minutes after po intake;Seated upright at 90 degrees    Other  Recommendations Oral Care Recommendations: Oral care BID   Follow up Recommendations None        Swallow Study   General HPI:  Ryan Hess a 82 y.o.malewith medical history significant of complex TAAA s/p [facility]AR at [facility], CKD stage 3, A.Fib on coumadin, HTN, COPD, PNA, esophageal dysmotility/presbyesophagus. Patient presents to the ED with 3 weeks of intermittent melena that became consistent melena in the  three days or so prior to admission, and difficulty walking.  No h/o N/V or abdominal pain. No fever, no cough, no congestion.Weakness is generalized without specific neurologic symptoms. Note CT abdomen 9/23 showed flud in lower thoracic esophagus.  Type of Study: Bedside Swallow Evaluation Previous Swallow Assessment: bedside eval in 2013 showed primary esophageal dysphagia with recommendations for a regular diet with precautions.  Diet Prior to this Study: Thin liquids (clear liquids) Temperature Spikes Noted: No Respiratory Status: Room air History of Recent Intubation: No Behavior/Cognition: Alert;Confused;Pleasant mood;Cooperative Oral Cavity Assessment: Within Functional Limits Oral Care Completed by SLP: No Oral Cavity - Dentition: Adequate natural dentition Vision: Functional for self-feeding Self-Feeding Abilities: Able to feed self Patient Positioning: Upright in bed Baseline Vocal Quality: Normal Volitional Cough: Strong Volitional Swallow: Able to elicit    Oral/Motor/Sensory Function Overall Oral Motor/Sensory Function: Within functional limits   Ice Chips Ice chips: Not tested   Thin Liquid Thin Liquid: Within functional limits Presentation: Cup;Self Fed;Straw    Nectar Thick Nectar Thick Liquid: Not tested   Honey Thick Honey Thick Liquid: Not tested   Puree Puree: Within functional limits Presentation: Self Fed;Spoon   Solid   GO  Ryan Covell MA, CCC-SLP [phone]  Solid: Within functional limits Presentation: Self Fed        Ryan Hess 06/19/2016,9:46 AM

## 2016-06-19 NOTE — Plan of Care (Signed)
Problem: Fluid Volume: Goal: Ability to maintain a balanced intake and output will improve Outcome: Progressing Discussed with patient and family about the need for insertion of foley with some teach back displayed.

## 2016-06-19 NOTE — Consult Note (Signed)
New Urology Consult Note   Requesting Attending Physician:  Florencia Reasons, MD Service Providing Consult: Urology Consulting Attending: Risa Grill, MD  Assessment:  Patient is a 82 y.o. male with multiple medical problems as noted in HPI with prostate cancer treated with radiation 2009 and subsequent PSA recurrence (patient of Dr. Ralene Muskrat at [facility]; PSA July 2016 2.8 from nadir <0.04), recurrent UTI in setting of urinary & fecal incontinence, admitted with weakness, fatigue, melena with Hb 8 and concern for GI bleed, and UTI. Urology consulted after renal u/s shows mild left hydro with layered debris in bladder, with acute on chronic kidney injury with Cr 5 from baseline ~2.   Recommendations: 1. Continue Foley catheter decompression 2. Continue culture-specific antibiotics for UTI 3. Agree with IVF hydration, continue to trend at least daily Cr 4. No intervention required for mild left hydronephrosis. Large stool burden noted on Ct, consider bowel regimen or bowel cleanout. Large stool burden can certainly exacerbate urinary symptoms, incontinence, and obstructive processes.  Thank you for this consult. Please contact the urology consult pager with any further questions/concerns. Sharmaine Base, MD Urology Surgical Resident  I performed a history and physical examination of the patient and discussed his management with the resident.  I reviewed the resident's note and agree with the documented findings and plan of care.  Agree with recommendations above. Would plan on Foley catheter for at least 4-5 days. Patient could also be discharged with the catheter with follow-up with his urologist Jeffie Pollock). Repeat ultrasound can be performed as an outpatient to confirm resolution of mild hydro-.  HPI -   Reason for Consult:  Mild hydronephrosis, UTI  Ryan Hess is a 82yo male seen in consultation for reasons noted above at the request of Florencia Reasons, MD.  He is accompanied by his son who  provides 27 of story. He tells me that patient has been fatigued over last month, with black stools over last week. He has no control of his urine at home, wears Depends at baseline (also has fecal incontinence). Changes 9-10 Depends a day. Does have back pain at baseline. No recent flank or abdominal pain. No recent gross hematuria. Patient of Dr. Ralene Muskrat here at [facility]. He did have UTIs in the past which have gotten better with hygiene. He has a history of a fib, CHF, COPD, HTN, HLD, prostate cancer treated with brachytherapy, PVD, and thoracic aortic aneurysm s/p EVAR, CKD III. He presented to ER 9/23 with 3 weeks of intermittent melena and fatigue. Hb 8.1 on arrival to ER with Cr 5.39 from recent baseline ~2.    CT imaging from a GU perspective showed "fullness of the left renal collecting system without overt left hydronephrosis", punctate intrarenal left stone (29mm), 4.1cm right renal cyst in anterior upper pole of kidney with diffuse bladder wall thickening and mild bladder distention. Normal size prostate noted with fiducial markers in the prostate. Renal U/S showed new mild left-sided hydronephrosis, distended bladder with thick wall and internal debris and right sided renal cortical thinning.  He has been treated with rocephin for UTI, now on Ancef. Foley catheter was placed evening shift of 9/24. Urine culture from ER 9/23 shows 50k CFU/mL Klebsiella pneumoniae resistant to Amp but otherwise pan-sensitive. Repeat urine culture 9/24 pending. Blood cultures 9/24 pending. His Cr has downtrended to 4.6 by today with resuscitation, pRBC transfusion, and Foley catheter placement. Large stool burden noted on CT imaging.   The patient himself is largely non-verbal during my visit.  Past Medical History: Past Medical History:  Diagnosis Date  . Actinic skin damage   . Atrial fibrillation ([place])   . CHF (congestive heart failure) ([place])   . Colon polyps   . COPD (chronic obstructive  pulmonary disease) ([place])   . Diverticulosis of colon   . DJD (degenerative joint disease)   . Drug therapy   . HTN (hypertension)   . Hypercholesteremia   . Hyperlipidemia   . Internal hemorrhoids   . Pneumonia, organism unspecified   . Prostate cancer ([place])   . Pulmonary nodule   . PVC (premature ventricular contraction)   . PVD (peripheral vascular disease) ([place])   . Thoracic aortic aneurysm [facility])     Past Surgical History:  Past Surgical History:  Procedure Laterality Date  . ABDOMINAL AORTIC ANEURYSM REPAIR  1998  . APPENDECTOMY    . APPLICATION OF WOUND VAC  04/18/2012   Procedure: APPLICATION OF WOUND VAC;  Surgeon: Joyice Faster. Cornett, MD;  Location: [place];  Service: General;  Laterality: N/A;  Removal of abdominal wound vac, Application of incisional  wound vac  . INGUINAL HERNIA REPAIR     left  . JOINT REPLACEMENT    . LAPAROTOMY  04/16/2012   Procedure: EXPLORATORY LAPAROTOMY;  Surgeon: Madilyn Hook, DO;  Location: [place];  Service: General;  Laterality: N/A;  exploratory Laparotomy,Small bowel ressection abdominal wound vac placement.  [name] LAPAROTOMY  04/18/2012   Procedure: EXPLORATORY LAPAROTOMY;  Surgeon: Joyice Faster. Cornett, MD;  Location: [place];  Service: General;  Laterality: N/A;  exploratory laparotomy with small bowel anastomosis  . repair of AA  04/14/2013   done at [place]  . TOTAL KNEE ARTHROPLASTY     bilateral    Medication: Current Facility-Administered Medications  Medication Dose Route Frequency Provider Last Rate Last Dose  . atorvastatin (LIPITOR) tablet 10 mg  10 mg Oral q1800 Etta Quill, DO   10 mg at 06/18/16 1821  . ceFAZolin (ANCEF) IVPB 1 g/50 mL premix  1 g Intravenous Q12H Florencia Reasons, MD      . famotidine (PEPCID) IVPB 20 mg premix  20 mg Intravenous Q24H Wilford Corner, MD   20 mg at 06/19/16 1205  . fluticasone furoate-vilanterol (BREO ELLIPTA) 200-25 MCG/INH 1 puff  1 puff Inhalation Daily Etta Quill, DO   1 puff at 06/19/16 0903   . levothyroxine (SYNTHROID, LEVOTHROID) tablet 50 mcg  50 mcg Oral QAC breakfast Etta Quill, DO   50 mcg at 06/19/16 0815  . MEDLINE mouth rinse  15 mL Mouth Rinse BID Etta Quill, DO   15 mL at 06/19/16 1000  . metoprolol tartrate (LOPRESSOR) tablet 12.5 mg  12.5 mg Oral BID Florencia Reasons, MD   12.5 mg at 06/18/16 2156  . potassium chloride SA (K-DUR,KLOR-CON) CR tablet 40 mEq  40 mEq Oral TID Jeryl Columbia, NP   40 mEq at 06/19/16 0818  . sodium bicarbonate tablet 650 mg  650 mg Oral QID Florencia Reasons, MD   650 mg at 06/19/16 0815  . tiotropium (SPIRIVA) inhalation capsule 18 mcg  18 mcg Inhalation Daily Etta Quill, DO   18 mcg at 06/19/16 0903    Allergies: No Known Allergies  Social History: Social History  Substance Use Topics  . Smoking status: Former Smoker    Packs/day: 2.00    Years: 44.00    Types: Cigarettes    Quit date: 05/15/1982  . Smokeless tobacco: Never Used  . Alcohol  use No    Family History Family History  Problem Relation Age of Onset  . Dementia Mother   . Stroke Father   . Diabetes Brother   . Heart disease Brother   . Heart disease Sister   . Heart disease Brother   . Obesity Brother   . Cancer Sister     back ??  . Alzheimer's disease Sister   . Colon cancer Neg Hx     Review of Systems 10 systems were reviewed and are negative except as noted specifically in the HPI.  Objective   Vital signs in last 24 hours: BP (!) 117/56   Pulse (!) 56   Temp 97.5 F (36.4 C) (Oral)   Resp 14   Ht 5\' 11"  (1.803 m)   Wt 164 lb 11.2 oz (74.7 kg)   SpO2 100%   BMI 22.97 kg/m   Intake/Output last 3 shifts: I/O last 3 completed shifts: In: 5348.8 [P.O.:1030; I.V.:1678.8; Blood:1340; IV Piggyback:1300] Out: [id number] [Urine:1275]  Physical Exam General: NAD, asleep for majority of visit, laying with eyes closed, elderly male HEENT: [place]/AT, EOMI, MMM Pulmonary: Normal work of breathing on RA Cardiovascular: Regular rate & rhythm, HDS, adequate  peripheral perfusion Abdomen: soft, NTTP, nondistended, no suprapubic fullness or tenderness GU: Foley catheter draining light grapefruit colored urine, no CVA tenderness DRE deferred Extremities: warm and well perfused, no edema  Most Recent Labs: Lab Results  Component Value Date   WBC 8.6 06/19/2016   HGB 9.4 (L) 06/19/2016   HCT 27.4 (L) 06/19/2016   PLT 95 (L) 06/19/2016    Lab Results  Component Value Date   NA 141 06/19/2016   K 2.8 (L) 06/19/2016   CL 115 (H) 06/19/2016   CO2 17 (L) 06/19/2016   BUN 111 (H) 06/19/2016   CREATININE 4.63 (H) 06/19/2016   CALCIUM 7.9 (L) 06/19/2016   MG 1.9 04/19/2012    Lab Results  Component Value Date   ALKPHOS 93 06/17/2016   BILITOT 1.1 06/17/2016   BILIDIR 0.4 (H) 12/16/2015   PROT 4.9 (L) 06/17/2016   ALBUMIN 2.5 (L) 06/17/2016   ALT 17 06/17/2016   AST 20 06/17/2016    Lab Results  Component Value Date   INR 1.38 06/19/2016   APTT 31 04/16/2012     IMAGING: Ct Abdomen Pelvis Wo Contrast  Result Date: 06/17/2016 CLINICAL DATA:  Fatigue and weakness for several weeks with black stools. History of thoracoabdominal aortic aneurysm repair in July 2014. EXAM: CT ABDOMEN AND PELVIS WITHOUT CONTRAST TECHNIQUE: Multidetector CT imaging of the abdomen and pelvis was performed following the standard protocol without IV contrast. COMPARISON:  Outside CT abdomen/pelvis from 05/19/2015. FINDINGS: Lower chest: Patchy subpleural reticulation at both lung bases. Centrilobular emphysema at the lung bases. Partially visualized bandlike focus of consolidation in the posterior right lower lobe is not definitely changed. Coronary atherosclerosis. Fluid is seen in the lower thoracic esophagus. Hepatobiliary: Diffuse hepatic steatosis. No liver mass. No appreciable cholelithiasis. A subcentimeter coarse calcification at the gallbladder fossa is favored to represent a calcified liver granuloma and is unchanged. No definite gallbladder wall  thickening or pericholecystic fluid. No biliary ductal dilatation. Pancreas: Diffuse fatty infiltration of the otherwise unremarkable pancreas, with no mass or duct dilation. Spleen: Normal size. No mass. Adrenals/Urinary Tract: Normal adrenals. No right hydronephrosis. Fullness of the left renal collecting system without overt left hydronephrosis. Punctate nonobstructing 2 mm stone in the lower left kidney. No right renal stones. Simple 4.1 cm renal cyst  in the anterior upper right kidney. Subcentimeter hypodense renal cortical lesions in the interpolar left kidney, too small to characterize, for which no further follow-up is required. Stable diffuse bladder wall thickening with mild bladder distention. No bladder stones. Stomach/Bowel: Grossly normal stomach. Normal caliber small bowel with no small bowel wall thickening. Appendectomy . Mild sigmoid diverticulosis, with no large bowel wall thickening or pericolonic fat stranding. Mild-to-moderate colorectal stool volume. Vascular/Lymphatic: Aortic atherosclerosis. There is a complex thoracoabdominal aortic aneurysm status post partially visualized aorto bi-iliac stent graft, with maximum aortic aneurysm diameter 7.0 cm, not appreciably changed from 05/19/2015. Celiac trunk, superior mesenteric artery and bilateral renal artery stents are in place. No acute para-aortic fluid collections. No pathologically enlarged lymph nodes in the abdomen or pelvis. Reproductive: Normal size prostate. Fiducial markers are seen in the prostate. Other: No pneumoperitoneum, ascites or focal fluid collection. Musculoskeletal: No aggressive appearing focal osseous lesions. Moderate thoracolumbar spondylosis. Stable moderate to severe L4 vertebral compression fracture. IMPRESSION: 1. No acute abnormality. No evidence of bowel obstruction or acute bowel inflammation. Mild sigmoid diverticulosis, with no evidence of acute diverticulitis. 2. Stable noncontrast CT appearance of the  visualized portions of the complex thoracoabdominal aneurysm status post aortobi-iliac stent graft, measuring 7.0 cm in maximum diameter. 3. Stable chronic diffuse bladder wall thickening with mild bladder distention. 4. Additional findings include aortic atherosclerosis, chronic L4 vertebral compression fracture, coronary atherosclerosis, centrilobular emphysema at the lung bases, punctate nonobstructing left renal stone and diffuse hepatic steatosis. Electronically Signed   By: Ilona Sorrel M.D.   On: 06/17/2016 23:13   US Renal  Result Date: 06/18/2016 CLINICAL DATA:  Elevated creatinine. EXAM: RENAL / URINARY TRACT ULTRASOUND COMPLETE COMPARISON:  CT of 1 day prior FINDINGS: Right Kidney: Length: 9.7 cm. Increased echogenicity. Cortical thinning. Interpolar right renal cyst measures 4.2 cm. Left Kidney: Length: 11.1 cm. Mild left-sided hydronephrosis, new since yesterday's CT. Normal renal cortical thickness and echogenicity for age. Bladder: Bladder distension with debris within.  Mild wall thickening. IMPRESSION: 1. Mild left-sided hydronephrosis, new since yesterday's CT. This may be secondary to bladder distention. 2. Distended thick-walled bladder with debris within. This is most consistent with a component of bladder outlet obstruction. 3. Right-sided renal cortical thinning with increased echogenicity, suggesting medical renal disease. Electronically Signed   By: Abigail Miyamoto M.D.   On: 06/18/2016 13:34

## 2016-06-19 NOTE — Telephone Encounter (Signed)
Per SN---  Pt got admitted over the weekend.  SN stated that this message was sent back to triage on Friday.

## 2016-06-19 NOTE — Progress Notes (Signed)
Later in the morning shift patient had some real tachycardia episodes. Checked morning lab values to find K+ 2.8 and paged on-call MD see [facility] for orders.

## 2016-06-19 NOTE — Progress Notes (Signed)
PROGRESS NOTE  Ryan Hess [id number] DOB: 09-09-1924 DOA: 06/17/2016 PCP: Noralee Space, MD  HPI/Recap of past 24 hours:  Family report patient has sundowning which happens when he is in the hospital  Patient is pleasantly confused, denies pain  Assessment/Plan: Principal Problem:   Melena Active Problems:   Essential hypertension   Atrial fibrillation (HCC)   COPD (chronic obstructive pulmonary disease) with chronic bronchitis (HCC)   Acute blood loss anemia   Chronic systolic congestive heart failure (HCC)   Acute kidney failure (HCC)   UTI (lower urinary tract infection)   GI bleed   Metabolic acidosis, NAG, bicarbonate losses   CKD (chronic kidney disease) stage 3, GFR 30-59 ml/min   Pressure injury of skin  Melena with acute blood loss anemia:  S/p prbc transfusion x2 units, hbg after prbc transfusion increase from 8 to 10.5 on 9/24, hgb 9.4 on 9/25.  on ppi, now started on clears, coumadin held, need GI guidance on when to resume anticoagulation and diet advancement per GI  GI Dr Michail Sermon input appreciated,  AKI on CKDIII, UTI  cr on admission is 5.39, baseline around 2,  aki likely due to uti/dehydration and gi bleed, urine culture klebsiella pneumoniae, sensitive to rocephin,  home meds lasix held, on gentle hydration,  Cr slowly trending down,  renal US on 9/24new mild left sided hydronephrosis , new since ct ab on 9/23, thick walled bladder with debris, foley placed, urology consulted   Metabolic acidosis likely from AKI: was on bicarb drip, changed to oral supplement  Chronic afib,  rate controlled, betablocker held initially due to hypotension. Now blood pressure better, restart low dose lopressor with holding parameters.   on coumadin at home which is held since admission due to gi bleed, will need GI guidance on when to resume anticoagulation  Hypotension/shock: (hypovolemic shock +/- septic shock, responded to ivf/prbc /abx , and holding blood  pressure meds, did not need pressors or stress dose steroids) Initial blood pressure 67/55  likely combination of gi bleed, infection and dehydration,  Has received ivf/prbc and abx, improving Restart low dose betablocker with holding parameters, continue hold other home bp meds  H/o systolic chf, last EF in 123456 45-50% Currently dehydrated, lasix held, on gentle hydration, watch volume status closely  COPD: no wheezing, continue home meds  Dementia: no oriented to time or place, family in room 24/7 for assistance  Code Status: DNR  Family Communication: patient and his son and daughter  Disposition Plan: to med tele   Consultants:  GI  urology  Procedures:  prbc transfusion x2units on 9/23-24night  Antibiotics:  rocephin   Objective: BP (!) 97/52   Pulse 84   Temp 98.2 F (36.8 C) (Oral)   Resp 18   Ht '5\' 11"'$  (1.803 m)   Wt 74.7 kg (164 lb 11.2 oz)   SpO2 95%   BMI 22.97 kg/m   Intake/Output Summary (Last 24 hours) at 06/19/16 0944 Last data filed at 06/19/16 0600  Gross per 24 hour  Intake          2036.67 ml  Output             1275 ml  Net           761.67 ml   Filed Weights   06/18/16 0145  Weight: 74.7 kg (164 lb 11.2 oz)    Exam:   General:  Frail, oriented to person only, pleasant and following commands  Cardiovascular: IRRR  Respiratory:  CTABL  Abdomen: Soft/ND/NT, positive BS, mild line well healed surgical scar from prior AAA surgery and bowel resection surgery  Musculoskeletal: No Edema  Neuro: oriented to person only, pleasant and following commands  Data Reviewed: Basic Metabolic Panel:  Recent Labs Lab 06/17/16 1730 06/18/16 0152 06/19/16 0245  NA 137 140 141  K 3.3* 3.4* 2.8*  CL 113* 115* 115*  CO2 12* 11* 17*  GLUCOSE 105* 94 94  BUN 141* 124* 111*  CREATININE 5.39* 4.84* 4.63*  CALCIUM 8.1* 8.0* 7.9*   Liver Function Tests:  Recent Labs Lab 06/17/16 1730  AST 20  ALT 17  ALKPHOS 93  BILITOT 1.1    PROT 4.9*  ALBUMIN 2.5*   No results for input(s): LIPASE, AMYLASE in the last 168 hours. No results for input(s): AMMONIA in the last 168 hours. CBC:  Recent Labs Lab 06/17/16 1730 06/18/16 0152 06/19/16 0245  WBC 9.5 9.6 8.6  NEUTROABS 7.5  --   --   HGB 8.1* 10.4*  10.5* 9.4*  HCT 23.6* 31.1*  31.1* 27.4*  MCV 92.5 91.5 90.1  PLT 134* 122* 95*   Cardiac Enzymes:    Recent Labs Lab 06/17/16 1730  TROPONINI 0.04*   BNP (last 3 results) No results for input(s): BNP in the last 8760 hours.  ProBNP (last 3 results)  Recent Labs  12/14/15 1219  PROBNP 462.0*    CBG: No results for input(s): GLUCAP in the last 168 hours.  Recent Results (from the past 240 hour(s))  Urine culture     Status: Abnormal   Collection Time: 06/17/16  5:00 PM  Result Value Ref Range Status   Specimen Description URINE, RANDOM  Final   Special Requests NONE  Final   Culture 50,000 COLONIES/mL KLEBSIELLA PNEUMONIAE (A)  Final   Report Status 06/19/2016 FINAL  Final   Organism ID, Bacteria KLEBSIELLA PNEUMONIAE (A)  Final      Susceptibility   Klebsiella pneumoniae - MIC*    AMPICILLIN >=32 RESISTANT Resistant     CEFAZOLIN <=4 SENSITIVE Sensitive     CEFTRIAXONE <=1 SENSITIVE Sensitive     CIPROFLOXACIN <=0.25 SENSITIVE Sensitive     GENTAMICIN <=1 SENSITIVE Sensitive     IMIPENEM <=0.25 SENSITIVE Sensitive     NITROFURANTOIN 32 SENSITIVE Sensitive     TRIMETH/SULFA <=20 SENSITIVE Sensitive     AMPICILLIN/SULBACTAM 4 SENSITIVE Sensitive     PIP/TAZO <=4 SENSITIVE Sensitive     Extended ESBL NEGATIVE Sensitive     * 50,000 COLONIES/mL KLEBSIELLA PNEUMONIAE  MRSA PCR Screening     Status: None   Collection Time: 06/18/16  1:44 AM  Result Value Ref Range Status   MRSA by PCR NEGATIVE NEGATIVE Final    Comment:        The GeneXpert MRSA Assay (FDA approved for NASAL specimens only), is one component of a comprehensive MRSA colonization surveillance program. It is  not intended to diagnose MRSA infection nor to guide or monitor treatment for MRSA infections.   Urine culture     Status: None (Preliminary result)   Collection Time: 06/18/16 10:45 AM  Result Value Ref Range Status   Specimen Description URINE, CLEAN CATCH  Final   Special Requests NONE  Final   Culture CULTURE REINCUBATED FOR BETTER GROWTH  Final   Report Status PENDING  Incomplete     Studies: US Renal  Result Date: 06/18/2016 CLINICAL DATA:  Elevated creatinine. EXAM: RENAL / URINARY TRACT ULTRASOUND COMPLETE COMPARISON:  CT of  1 day prior FINDINGS: Right Kidney: Length: 9.7 cm. Increased echogenicity. Cortical thinning. Interpolar right renal cyst measures 4.2 cm. Left Kidney: Length: 11.1 cm. Mild left-sided hydronephrosis, new since yesterday's CT. Normal renal cortical thickness and echogenicity for age. Bladder: Bladder distension with debris within.  Mild wall thickening. IMPRESSION: 1. Mild left-sided hydronephrosis, new since yesterday's CT. This may be secondary to bladder distention. 2. Distended thick-walled bladder with debris within. This is most consistent with a component of bladder outlet obstruction. 3. Right-sided renal cortical thinning with increased echogenicity, suggesting medical renal disease. Electronically Signed   By: Abigail Miyamoto M.D.   On: 06/18/2016 13:34    Scheduled Meds: . atorvastatin  10 mg Oral q1800  . cefTRIAXone (ROCEPHIN)  IV  1 g Intravenous Q24H  . famotidine (PEPCID) IV  20 mg Intravenous Q24H  . fluticasone furoate-vilanterol  1 puff Inhalation Daily  . levothyroxine  50 mcg Oral QAC breakfast  . mouth rinse  15 mL Mouth Rinse BID  . metoprolol tartrate  12.5 mg Oral BID  . potassium chloride  40 mEq Oral TID  . sodium bicarbonate  650 mg Oral QID  . tiotropium  18 mcg Inhalation Daily    Continuous Infusions: . lactated ringers 50 mL/hr at 06/19/16 0334     Time spent: 1mns  Aminta Sakurai MD, PhD  [facility] Pager  [phone] If 7PM-7AM, please contact night-coverage at [email or web address], password [email or web address]9/25/2017, 9:44 AM  LOS: 2 days

## 2016-06-19 NOTE — Evaluation (Signed)
Physical Therapy Evaluation Patient Details Name: Ryan Hess MRN: [id number] DOB: 1924-08-12 Today's Date: 06/19/2016   History of Present Illness  Pt adm with GI bleed. PMH - afib, copd, htn, chf  Clinical Impression  Pt admitted with above diagnosis and presents to PT with functional limitations due to deficits listed below (See PT problem list). Pt needs skilled PT to maximize independence and safety to allow discharge to home with very supportive family. Expect cognition will be better in home environment.     Follow Up Recommendations Home health PT;Supervision/Assistance - 24 hour    Equipment Recommendations  None recommended by PT    Recommendations for Other Services       Precautions / Restrictions Precautions Precautions: Fall      Mobility  Bed Mobility Overal bed mobility: Needs Assistance Bed Mobility: Supine to Sit     Supine to sit: Min assist     General bed mobility comments: Assist to elevate trunk into sitting and to bring hips to EOB  Transfers Overall transfer level: Needs assistance Equipment used: 2 person hand held assist Transfers: Sit to/from Stand;Stand Pivot Transfers Sit to Stand: +2 physical assistance;Min assist Stand pivot transfers: +2 physical assistance;Min assist       General transfer comment: assist for balance and to bring hips up. Constant verbal cues for pt to attend to task  Ambulation/Gait Ambulation/Gait assistance: Min assist;+2 safety/equipment Ambulation Distance (Feet): 15 Feet Assistive device: Rolling walker (2 wheeled) Gait Pattern/deviations: Step-through pattern;Decreased step length - right;Decreased step length - left;Trunk flexed   Gait velocity interpretation: <1.8 ft/sec, indicative of risk for recurrent falls General Gait Details: assist for balance. constant cues for use of rolling walker. Pt kept wanting to let go of walker and reach for bed. Pt difficult to redirect at times.  Stairs             Wheelchair Mobility    Modified Rankin (Stroke Patients Only)       Balance Overall balance assessment: Needs assistance Sitting-balance support: No upper extremity supported;Feet supported Sitting balance-Leahy Scale: Good     Standing balance support: Single extremity supported Standing balance-Leahy Scale: Poor Standing balance comment: Min A for static standing                             Pertinent Vitals/Pain Pain Assessment: No/denies pain    Home Living Family/patient expects to be discharged to:: Private residence Living Arrangements: Children Available Help at Discharge: Available 24 hours/day Type of Home: House Home Access: Stairs to enter   [employer] of Steps: 1 very small Home Layout: One level Home Equipment: Environmental consultant - 2 wheels;Cane - single point;Bedside commode;Wheelchair - manual;Shower seat;Grab bars - tub/shower Additional Comments: son provides assist as needed    Prior Function Level of Independence: Needs assistance   Gait / Transfers Assistance Needed: amb modified independent with cane           Hand Dominance   Dominant Hand: Right    Extremity/Trunk Assessment   Upper Extremity Assessment: Overall WFL for tasks assessed           Lower Extremity Assessment: Generalized weakness         Communication   Communication: No difficulties  Cognition Arousal/Alertness: Awake/alert Behavior During Therapy: Impulsive Overall Cognitive Status: Impaired/Different from baseline Area of Impairment: Orientation;Attention;Memory;Following commands;Safety/judgement;Problem solving Orientation Level: Disoriented to;Situation;Place Current Attention Level: Sustained Memory: Decreased recall of precautions;Decreased short-term memory Following  Commands: Follows one step commands inconsistently Safety/Judgement: Decreased awareness of safety;Decreased awareness of deficits   Problem Solving: Requires verbal  cues;Requires tactile cues      General Comments      Exercises     Assessment/Plan    PT Assessment Patient needs continued PT services  PT Problem List Decreased strength;Decreased activity tolerance;Decreased balance;Decreased mobility;Decreased cognition;Decreased safety awareness;Decreased knowledge of use of DME;Decreased knowledge of precautions          PT Treatment Interventions DME instruction;Gait training;Functional mobility training;Therapeutic activities;Patient/family education;Balance training;Therapeutic exercise    PT Goals (Current goals can be found in the Care Plan section)  Acute Rehab PT Goals Patient Stated Goal: return home with son PT Goal Formulation: With patient/family Time For Goal Achievement: 06/26/16 Potential to Achieve Goals: Good    Frequency Min 3X/week   Barriers to discharge        Co-evaluation               End of Session Equipment Utilized During Treatment: Gait belt Activity Tolerance: Patient tolerated treatment well Patient left: in chair;with call bell/phone within reach;with chair alarm set;with family/visitor present Nurse Communication: Mobility status         Time: 1315-1335 PT Time Calculation (min) (ACUTE ONLY): 20 min   Charges:   PT Evaluation $PT Eval Moderate Complexity: 1 Procedure     PT G Codes:        Alden Bensinger 06/23/2016, 2:38 PM [facility] PT [phone]

## 2016-06-20 ENCOUNTER — Inpatient Hospital Stay (HOSPITAL_COMMUNITY): Payer: Medicare Other

## 2016-06-20 LAB — BASIC METABOLIC PANEL
Anion gap: 8 (ref 5–15)
BUN: 94 mg/dL — ABNORMAL HIGH (ref 6–20)
CO2: 16 mmol/L — ABNORMAL LOW (ref 22–32)
Calcium: 8.2 mg/dL — ABNORMAL LOW (ref 8.9–10.3)
Chloride: 116 mmol/L — ABNORMAL HIGH (ref 101–111)
Creatinine, Ser: 4.49 mg/dL — ABNORMAL HIGH (ref 0.61–1.24)
GFR calc Af Amer: 12 mL/min — ABNORMAL LOW (ref >60)
GFR calc non Af Amer: 10 mL/min — ABNORMAL LOW (ref >60)
Glucose, Bld: 99 mg/dL (ref 65–99)
Potassium: 4.3 mmol/L (ref 3.5–5.1)
Sodium: 140 mmol/L (ref 135–145)

## 2016-06-20 LAB — PROTIME-INR
INR: 1.39
Prothrombin Time: 17.2 seconds — ABNORMAL HIGH (ref 11.4–15.2)

## 2016-06-20 LAB — CBC
HCT: 28.7 % — ABNORMAL LOW (ref 39.0–52.0)
Hemoglobin: 9.6 g/dL — ABNORMAL LOW (ref 13.0–17.0)
MCH: 30.6 pg (ref 26.0–34.0)
MCHC: 33.4 g/dL (ref 30.0–36.0)
MCV: 91.4 fL (ref 78.0–100.0)
Platelets: 87 10*3/uL — ABNORMAL LOW (ref 150–400)
RBC: 3.14 MIL/uL — ABNORMAL LOW (ref 4.22–5.81)
RDW: 15.6 % — ABNORMAL HIGH (ref 11.5–15.5)
WBC: 10.8 10*3/uL — ABNORMAL HIGH (ref 4.0–10.5)

## 2016-06-20 LAB — GLUCOSE, CAPILLARY: Glucose-Capillary: 86 mg/dL (ref 65–99)

## 2016-06-20 LAB — LACTIC ACID, PLASMA: Lactic Acid, Venous: 1.3 mmol/L (ref 0.5–1.9)

## 2016-06-20 MED ORDER — LEVOTHYROXINE SODIUM 50 MCG PO TABS
50.0000 ug | ORAL_TABLET | Freq: Every day | ORAL | Status: DC
  Administered 2016-06-20 – 2016-06-26 (×7): 50 ug via ORAL
  Filled 2016-06-20 (×7): qty 1

## 2016-06-20 MED ORDER — SENNOSIDES-DOCUSATE SODIUM 8.6-50 MG PO TABS
2.0000 | ORAL_TABLET | Freq: Two times a day (BID) | ORAL | Status: DC
  Administered 2016-06-20 – 2016-06-24 (×8): 2 via ORAL
  Filled 2016-06-20 (×9): qty 2

## 2016-06-20 MED ORDER — LACTATED RINGERS IV SOLN
INTRAVENOUS | Status: DC
  Administered 2016-06-20: 16:00:00 via INTRAVENOUS

## 2016-06-20 MED ORDER — IOPAMIDOL (ISOVUE-300) INJECTION 61%
INTRAVENOUS | Status: AC
  Filled 2016-06-20: qty 30

## 2016-06-20 MED ORDER — MORPHINE SULFATE (PF) 2 MG/ML IV SOLN
1.0000 mg | INTRAVENOUS | Status: DC | PRN
  Administered 2016-06-20: 1 mg via INTRAVENOUS
  Filled 2016-06-20: qty 1

## 2016-06-20 MED ORDER — POLYETHYLENE GLYCOL 3350 17 G PO PACK
17.0000 g | PACK | Freq: Every day | ORAL | Status: DC
  Administered 2016-06-20 – 2016-06-26 (×4): 17 g via ORAL
  Filled 2016-06-20 (×7): qty 1

## 2016-06-20 NOTE — Care Management Important Message (Signed)
Important Message  Patient Details  Name: Ryan Hess MRN: [id number] Date of Birth: 1924-09-14   Medicare Important Message Given:  Yes    [name] 06/20/2016, 10:50 AM

## 2016-06-20 NOTE — Progress Notes (Addendum)
Foley catheter repositioned by nursing director and this RN. Yellow urine returned noted. No dependent loops and stat lock in place.

## 2016-06-20 NOTE — Consult Note (Signed)
Consultant Final Sign-Off Note    Assessment/Final recommendations  Ryan Hess is a 82 y.o. male followed by me for mild left hydronephrosis and UTI. Please see our note from 9/25 for more information.   Wound care (if applicable): N/a   Diet at discharge: per primary team   Activity at discharge: per primary team, no restrictions from urologic appointment   Follow-up appointment:  Patient/family should call [facility] on discharge to schedule follow up appointment with Dr. Jeffie Hess or partner. We can consider repeat ultrasound as outpatient to trend left mild hydronephrosis, and consider testing creatinine as outpatient as well.   If patient is discharged with Foley catheter in place we can also consider removal of this as outpatient with Dr. Jeffie Hess.   Pending results:  [employer]     Ordered   06/20/16 0500  Protime-INR  Daily,   R    Question:  Specimen collection method  Answer:  Lab=Lab collect   06/19/16 0743       Medication recommendations: Culture specific antibiotics   Other recommendations: Continue Foley catheter at least for another 4-5 days, ideally until Cr reaches nadir of ~2 and patient is on culture specific antibiotics. Recommend aggressive bowel cleanout to prevent bowel-bladder dysfunction.    Thank you for allowing [place] to participate in the care of your patient!  Please consult [place] again if you have further needs for your patient.  Ryan Hess 06/20/2016 1:18 PM  I performed a history and physical examination of the patient and discussed his management with the resident.  I reviewed the resident's note and agree with the documented findings and plan of care.  Subjective   Transferred to floor overnight. + Bm. No fever. Episode of significant pain after BM this morning, unable to localize per daughter & son, with resulting syncope (possibly vasovagal?). Primary team considering head CT vs MRI today. Cr today is  downtrending to 4.49. Urine cultures showing Klebsiella, currently on Ancef. No n/v.  Objective  Vital signs in last 24 hours: Temp:  [97.4 F (36.3 C)-98.3 F (36.8 C)] 97.8 F (36.6 C) (09/26 0832) Pulse Rate:  [74-105] 81 (09/26 1241) Resp:  [17-21] 18 (09/26 1241) BP: (95-138)/(58-95) 117/95 (09/26 1241) SpO2:  [96 %-100 %] 100 % (09/26 0833) Weight:  [169 lb 12.1 oz (77 kg)] 169 lb 12.1 oz (77 kg) (09/25 2231)   General: NAD, eating/drinking during visit, elderly male HEENT: [name]/AT, EOMI, MMM, [place] in place Pulmonary: Normal work of breathing on supplemental oxygen Cardiovascular: Regular rate & rhythm, HDS, adequate peripheral perfusion Abdomen: soft, NTTP, nondistended, no suprapubic fullness or tenderness GU: Foley catheter draining yellow urine, no CVA tenderness DRE deferred Extremities: warm and well perfused, no edema    Pertinent labs and Studies:  Recent Labs  06/18/16 0152 06/19/16 0245 06/20/16 0241  WBC 9.6 8.6 10.8*  HGB 10.4*  10.5* 9.4* 9.6*  HCT 31.1*  31.1* 27.4* 28.7*   BMET  Recent Labs  06/19/16 0245 06/20/16 0241  NA 141 140  K 2.8* 4.3  CL 115* 116*  CO2 17* 16*  GLUCOSE 94 99  BUN 111* 94*  CREATININE 4.63* 4.49*  CALCIUM 7.9* 8.2*   No results for input(s): LABURIN in the last 72 hours. Results for orders placed or performed during the hospital encounter of 06/17/16  Urine culture     Status: Abnormal   Collection Time: 06/17/16  5:00 PM  Result Value Ref Range Status  Specimen Description URINE, RANDOM  Final   Special Requests NONE  Final   Culture 50,000 COLONIES/mL KLEBSIELLA PNEUMONIAE (A)  Final   Report Status 06/19/2016 FINAL  Final   Organism ID, Bacteria KLEBSIELLA PNEUMONIAE (A)  Final      Susceptibility   Klebsiella pneumoniae - MIC*    AMPICILLIN >=32 RESISTANT Resistant     CEFAZOLIN <=4 SENSITIVE Sensitive     CEFTRIAXONE <=1 SENSITIVE Sensitive     CIPROFLOXACIN <=0.25 SENSITIVE Sensitive     GENTAMICIN  <=1 SENSITIVE Sensitive     IMIPENEM <=0.25 SENSITIVE Sensitive     NITROFURANTOIN 32 SENSITIVE Sensitive     TRIMETH/SULFA <=20 SENSITIVE Sensitive     AMPICILLIN/SULBACTAM 4 SENSITIVE Sensitive     PIP/TAZO <=4 SENSITIVE Sensitive     Extended ESBL NEGATIVE Sensitive     * 50,000 COLONIES/mL KLEBSIELLA PNEUMONIAE  MRSA PCR Screening     Status: None   Collection Time: 06/18/16  1:44 AM  Result Value Ref Range Status   MRSA by PCR NEGATIVE NEGATIVE Final    Comment:        The GeneXpert MRSA Assay (FDA approved for NASAL specimens only), is one component of a comprehensive MRSA colonization surveillance program. It is not intended to diagnose MRSA infection nor to guide or monitor treatment for MRSA infections.   Urine culture     Status: Abnormal (Preliminary result)   Collection Time: 06/18/16 10:45 AM  Result Value Ref Range Status   Specimen Description URINE, CLEAN CATCH  Final   Special Requests NONE  Final   Culture >=100,000 COLONIES/mL KLEBSIELLA PNEUMONIAE (A)  Final   Report Status PENDING  Incomplete  Culture, blood (routine x 2)     Status: None (Preliminary result)   Collection Time: 06/18/16  6:29 PM  Result Value Ref Range Status   Specimen Description BLOOD LEFT ANTECUBITAL  Final   Special Requests BOTTLES DRAWN AEROBIC AND ANAEROBIC 5CC  Final   Culture NO GROWTH < 24 HOURS  Final   Report Status PENDING  Incomplete  Culture, blood (routine x 2)     Status: None (Preliminary result)   Collection Time: 06/18/16  6:36 PM  Result Value Ref Range Status   Specimen Description BLOOD LEFT ARM  Final   Special Requests BOTTLES DRAWN AEROBIC AND ANAEROBIC 5CC  Final   Culture NO GROWTH < 24 HOURS  Final   Report Status PENDING  Incomplete    Imaging: Dg Abd Portable 1v  Result Date: 06/20/2016 CLINICAL DATA:  82 year old male with abdominal pain. EXAM: PORTABLE ABDOMEN - 1 VIEW COMPARISON:  Abdominal CT dated 06/17/2016 FINDINGS: There is moderate stool  throughout the colon. No bowel dilatation or evidence of obstruction. No free air or radiopaque calculi identified. There is thoracolumbar aortic aneurysm endovascular stent graft repair. Bilateral renal artery stents also noted. There is degenerative changes of the spine. Chronic L4 compression deformity. No acute fracture. IMPRESSION: No bowel obstruction or free air. Endovascular stent graft repair of the thoracolumbar aortic aneurysm. Electronically Signed   By: Anner Crete M.D.   On: 06/20/2016 07:01

## 2016-06-20 NOTE — Progress Notes (Signed)
Pt with episode of syncope after having BM and having complaints of severe abdominal pain thereafter. VS stable. CBG = 86. RN and rapid response RN able to only slightly arouse the pt despite sternal rubs. MD paged. MD instructed to recheck VS hourly. Pt fully alert 30 minutes later with more severe abdominal pain. IV Morphine and abdominal X-ray ordered. Will continue to monitor.

## 2016-06-20 NOTE — Progress Notes (Signed)
PT Cancellation Note  Patient Details Name: Ryan Hess MRN: [id number] DOB: 04-14-1924   Cancelled Treatment:    Reason Eval/Treat Not Completed: Patient at procedure or test/unavailable   Transporter taking pt from room on arrival. Will reattempt as schedule permits (or see [id number])   Brolin Dambrosia 06/20/2016, 2:31 PM Pager [phone]

## 2016-06-20 NOTE — Progress Notes (Addendum)
PROGRESS NOTE  Ryan Hess [id number] DOB: 06-Jun-1924 DOA: 06/17/2016 PCP: Noralee Space, MD  Brief summary:  H/o afib on coumadin, presented with Hypotension, sbp in the 60's, aki, uti, melena, anemia, supratherapeutic INR, received vitk, prbc transfusion, ppi, gi signed off Developed unresponsive episode 9/26 around 5am while having bm, ? vagal response to having a BM. Mri brain pending. Urology consulted for mild hydro, recommend keep foley in at discharge and outpatient f/u with urology.     HPI/Recap of past 24 hours:  Has an unresponsiveness episode this am while trying to have a BM, rapid respond called, paitent 's vital were stable during the episode, stat kub unremarkable, patient returned to baseline about 44mins after the episode  Per family stool is pasty and remains dark in color  Patient is pleasantly confused, denies pain, having lunch currently   Assessment/Plan: Principal Problem:   Melena Active Problems:   Essential hypertension   Atrial fibrillation (HCC)   COPD (chronic obstructive pulmonary disease) with chronic bronchitis (HCC)   Acute blood loss anemia   Chronic systolic congestive heart failure (HCC)   Acute kidney failure (HCC)   UTI (lower urinary tract infection)   GI bleed   Metabolic acidosis, NAG, bicarbonate losses   CKD (chronic kidney disease) stage 3, GFR 30-59 ml/min   Pressure injury of skin  Melena with acute blood loss anemia:  S/p prbc transfusion x2 units, hbg after prbc transfusion increase from 8 to 10.5 on 9/24, hgb 9.4 on 9/25. hgb 9.6 on 9/26  on ppi, now started on clears, coumadin held, need GI guidance on when to resume anticoagulation and diet advancement per GI  GI Dr Michail Sermon input appreciated,  AKI on CKDIII, UTI  cr on admission is 5.39, baseline around 2,  aki likely due to uti/dehydration and gi bleed, urine culture klebsiella pneumoniae, sensitive to rocephin,  home meds lasix held, on gentle hydration,    Cr slowly trending down,  renal US on 9/24new mild left sided hydronephrosis , new since ct ab on 9/23, thick walled bladder with debris, foley placed, urology consulted, per urology will keep foley in at discharge and outpatient follow up with urology.   Metabolic acidosis likely from AKI: was on bicarb drip, changed to oral supplement  Chronic afib,  rate controlled, betablocker held initially due to hypotension. Now blood pressure better, restart low dose lopressor with holding parameters.   on coumadin at home which is held since admission due to gi bleed, will need GI guidance on when to resume anticoagulation  Hypotension/shock: (hypovolemic shock +/- septic shock, responded to ivf/prbc /abx , and holding blood pressure meds, did not need pressors or stress dose steroids) Initial blood pressure 67/55  likely combination of gi bleed, infection and dehydration,  Has received ivf/prbc and abx, improving Restart low dose betablocker with holding parameters, continue hold other home bp meds  H/o systolic chf, last EF in [id number] 45-50% Currently dehydrated, lasix held, on gentle hydration, watch volume status closely  COPD: no wheezing, continue home meds  Dementia: no oriented to time or place,very supportive family in room 24/7 for assistance  Unresponsive episode on 9/26 am: possible vasal vagal while having bm, returned to normal after 23mins, vital were stable. He does has risk of having stroke, will get MRI brain.   Thrombocytopenia: new, from infection? Monitor trend. No exposure to heparin product, no physical signs to suggest DVT.   Addendum: 4:40 pm  Ct ab showed bilateral pleural effusion ,will  d/c ivf,  Urine output 850 last 24hrs, cr slowly trending down, family does not want dialysis per admitting note. Ct ab also showed foley displacement, need to reposition foley, RN notified.   Code Status: DNR  Family Communication: patient and his son and daughter  Disposition  Plan: home with home health likely,  I have discussed with family about hospice, they are not ready for this yet. they do like to minimize hospitalization or go to the clinic, they would like to continue coumadin if possible, they would like home health RN to check INR at home instead to go to coumadin clinic   Consultants:  GI  urology  Procedures:  prbc transfusion x2units on 9/23-24night  Antibiotics:  Rocephin from admission to 9/26  Cefazolin from 9/26   Objective: BP (!) 117/95 (BP Location: Right Arm)   Pulse 81   Temp 97.8 F (36.6 C) (Oral)   Resp 18   Ht 5\' 8"  (1.727 m)   Wt 77 kg (169 lb 12.1 oz)   SpO2 100%   BMI 25.81 kg/m   Intake/Output Summary (Last 24 hours) at 06/20/16 1255 Last data filed at 06/20/16 1003  Gross per 24 hour  Intake             1970 ml  Output              750 ml  Net             1220 ml   Filed Weights   06/18/16 0145 06/19/16 2231  Weight: 74.7 kg (164 lb 11.2 oz) 77 kg (169 lb 12.1 oz)    Exam:   General:  Frail, oriented to person only, pleasant and following commands, foley in place with clear urine  Cardiovascular: IRRR  Respiratory: CTABL  Abdomen: Soft/ND/NT, positive BS, mid line well healed surgical scar from prior AAA surgery and bowel resection surgery  Musculoskeletal: No Edema  Neuro: oriented to person only, pleasant and following commands  Data Reviewed: Basic Metabolic Panel:  Recent Labs Lab 06/17/16 1730 06/18/16 0152 06/19/16 0245 06/20/16 0241  NA 137 140 141 140  K 3.3* 3.4* 2.8* 4.3  CL 113* 115* 115* 116*  CO2 12* 11* 17* 16*  GLUCOSE 105* 94 94 99  BUN 141* 124* 111* 94*  CREATININE 5.39* 4.84* 4.63* 4.49*  CALCIUM 8.1* 8.0* 7.9* 8.2*   Liver Function Tests:  Recent Labs Lab 06/17/16 1730  AST 20  ALT 17  ALKPHOS 93  BILITOT 1.1  PROT 4.9*  ALBUMIN 2.5*   No results for input(s): LIPASE, AMYLASE in the last 168 hours. No results for input(s): AMMONIA in the last 168  hours. CBC:  Recent Labs Lab 06/17/16 1730 06/18/16 0152 06/19/16 0245 06/20/16 0241  WBC 9.5 9.6 8.6 10.8*  NEUTROABS 7.5  --   --   --   HGB 8.1* 10.4*  10.5* 9.4* 9.6*  HCT 23.6* 31.1*  31.1* 27.4* 28.7*  MCV 92.5 91.5 90.1 91.4  PLT 134* 122* 95* 87*   Cardiac Enzymes:    Recent Labs Lab 06/17/16 1730  TROPONINI 0.04*   BNP (last 3 results) No results for input(s): BNP in the last 8760 hours.  ProBNP (last 3 results)  Recent Labs  12/14/15 1219  PROBNP 462.0*    CBG:  Recent Labs Lab 06/20/16 0523  GLUCAP 86    Recent Results (from the past 240 hour(s))  Urine culture     Status: Abnormal   Collection Time: 06/17/16  5:00 PM  Result Value Ref Range Status   Specimen Description URINE, RANDOM  Final   Special Requests NONE  Final   Culture 50,000 COLONIES/mL KLEBSIELLA PNEUMONIAE (A)  Final   Report Status 06/19/2016 FINAL  Final   Organism ID, Bacteria KLEBSIELLA PNEUMONIAE (A)  Final      Susceptibility   Klebsiella pneumoniae - MIC*    AMPICILLIN >=32 RESISTANT Resistant     CEFAZOLIN <=4 SENSITIVE Sensitive     CEFTRIAXONE <=1 SENSITIVE Sensitive     CIPROFLOXACIN <=0.25 SENSITIVE Sensitive     GENTAMICIN <=1 SENSITIVE Sensitive     IMIPENEM <=0.25 SENSITIVE Sensitive     NITROFURANTOIN 32 SENSITIVE Sensitive     TRIMETH/SULFA <=20 SENSITIVE Sensitive     AMPICILLIN/SULBACTAM 4 SENSITIVE Sensitive     PIP/TAZO <=4 SENSITIVE Sensitive     Extended ESBL NEGATIVE Sensitive     * 50,000 COLONIES/mL KLEBSIELLA PNEUMONIAE  MRSA PCR Screening     Status: None   Collection Time: 06/18/16  1:44 AM  Result Value Ref Range Status   MRSA by PCR NEGATIVE NEGATIVE Final    Comment:        The GeneXpert MRSA Assay (FDA approved for NASAL specimens only), is one component of a comprehensive MRSA colonization surveillance program. It is not intended to diagnose MRSA infection nor to guide or monitor treatment for MRSA infections.   Urine culture      Status: Abnormal (Preliminary result)   Collection Time: 06/18/16 10:45 AM  Result Value Ref Range Status   Specimen Description URINE, CLEAN CATCH  Final   Special Requests NONE  Final   Culture >=100,000 COLONIES/mL KLEBSIELLA PNEUMONIAE (A)  Final   Report Status PENDING  Incomplete  Culture, blood (routine x 2)     Status: None (Preliminary result)   Collection Time: 06/18/16  6:29 PM  Result Value Ref Range Status   Specimen Description BLOOD LEFT ANTECUBITAL  Final   Special Requests BOTTLES DRAWN AEROBIC AND ANAEROBIC 5CC  Final   Culture NO GROWTH < 24 HOURS  Final   Report Status PENDING  Incomplete  Culture, blood (routine x 2)     Status: None (Preliminary result)   Collection Time: 06/18/16  6:36 PM  Result Value Ref Range Status   Specimen Description BLOOD LEFT ARM  Final   Special Requests BOTTLES DRAWN AEROBIC AND ANAEROBIC 5CC  Final   Culture NO GROWTH < 24 HOURS  Final   Report Status PENDING  Incomplete     Studies: Dg Abd Portable 1v  Result Date: 06/20/2016 CLINICAL DATA:  82 year old male with abdominal pain. EXAM: PORTABLE ABDOMEN - 1 VIEW COMPARISON:  Abdominal CT dated 06/17/2016 FINDINGS: There is moderate stool throughout the colon. No bowel dilatation or evidence of obstruction. No free air or radiopaque calculi identified. There is thoracolumbar aortic aneurysm endovascular stent graft repair. Bilateral renal artery stents also noted. There is degenerative changes of the spine. Chronic L4 compression deformity. No acute fracture. IMPRESSION: No bowel obstruction or free air. Endovascular stent graft repair of the thoracolumbar aortic aneurysm. Electronically Signed   By: Anner Crete M.D.   On: 06/20/2016 07:01    Scheduled Meds: . iopamidol      . atorvastatin  10 mg Oral q1800  .  ceFAZolin (ANCEF) IV  1 g Intravenous Q12H  . famotidine (PEPCID) IV  20 mg Intravenous Q24H  . fluticasone furoate-vilanterol  1 puff Inhalation Daily  .  levothyroxine  50 mcg  Oral QAC breakfast  . mouth rinse  15 mL Mouth Rinse BID  . metoprolol tartrate  12.5 mg Oral BID  . polyethylene glycol  17 g Oral Daily  . potassium chloride  40 mEq Oral TID  . senna-docusate  2 tablet Oral BID  . sodium bicarbonate  650 mg Oral QID  . tiotropium  18 mcg Inhalation Daily    Continuous Infusions: . lactated ringers       Time spent: 8mins  Lawson Isabell MD, PhD  [facility] Pager [phone]. If 7PM-7AM, please contact night-coverage at [email or web address], password [facility] 06/20/2016, 12:55 PM  LOS: 3 days

## 2016-06-20 NOTE — Significant Event (Signed)
Rapid Response Event Note RN called for pt unresponsive  Overview: Time Called: 0508 Arrival Time: 0509 Event Type: Neurologic  Initial Focused Assessment: Pt not responding, warm and dry to touch. RR 18, HR 90-108 Afib, BP 138/75, 97% RA. Pt not responding to verbal or painful stimuli. Pt eventually nodded his head "no" when asked to open his eyes. Pt was trying to have a BM, per son at bedside he was alert, oriented and talking prior to episode. Pt's VS remained stable through out. Pt with a glazed over look.   Interventions:  Placed on 2 L [place] VS. BP 138/65, HR 102, RR 18, 99% 2L [place]  Plan of Care (if not transferred): Baltazar Najjar NP wishes to monitor at this time due to possible vasovagal with BM. Follow up with RN, pt fully awake and c/o abd pain. Baltazar Najjar ordered a stat portable abd xr and Morphine 1 mg IVP PRN for abd pain.  Event Summary: Name of Physician Notified: Baltazar Najjar NP  at [place]    at    Outcome: Stayed in room and stabalized  Event End Time: [place], Real

## 2016-06-21 DIAGNOSIS — K921 Melena: Principal | ICD-10-CM

## 2016-06-21 DIAGNOSIS — N183 Chronic kidney disease, stage 3 (moderate): Secondary | ICD-10-CM

## 2016-06-21 DIAGNOSIS — D62 Acute posthemorrhagic anemia: Secondary | ICD-10-CM

## 2016-06-21 DIAGNOSIS — N390 Urinary tract infection, site not specified: Secondary | ICD-10-CM

## 2016-06-21 DIAGNOSIS — E872 Acidosis: Secondary | ICD-10-CM

## 2016-06-21 DIAGNOSIS — I10 Essential (primary) hypertension: Secondary | ICD-10-CM

## 2016-06-21 DIAGNOSIS — J449 Chronic obstructive pulmonary disease, unspecified: Secondary | ICD-10-CM

## 2016-06-21 DIAGNOSIS — I5022 Chronic systolic (congestive) heart failure: Secondary | ICD-10-CM

## 2016-06-21 DIAGNOSIS — I482 Chronic atrial fibrillation: Secondary | ICD-10-CM

## 2016-06-21 LAB — CBC
HCT: 32.6 % — ABNORMAL LOW (ref 39.0–52.0)
Hemoglobin: 10.7 g/dL — ABNORMAL LOW (ref 13.0–17.0)
MCH: 30.8 pg (ref 26.0–34.0)
MCHC: 32.8 g/dL (ref 30.0–36.0)
MCV: 93.9 fL (ref 78.0–100.0)
Platelets: 94 10*3/uL — ABNORMAL LOW (ref 150–400)
RBC: 3.47 MIL/uL — ABNORMAL LOW (ref 4.22–5.81)
RDW: 16.1 % — ABNORMAL HIGH (ref 11.5–15.5)
WBC: 13.2 10*3/uL — ABNORMAL HIGH (ref 4.0–10.5)

## 2016-06-21 LAB — DIFFERENTIAL
Basophils Absolute: 0 10*3/uL (ref 0.0–0.1)
Basophils Relative: 0 %
Eosinophils Absolute: 0.1 10*3/uL (ref 0.0–0.7)
Eosinophils Relative: 1 %
Lymphocytes Relative: 5 %
Lymphs Abs: 0.5 10*3/uL — ABNORMAL LOW (ref 0.7–4.0)
Monocytes Absolute: 1 10*3/uL (ref 0.1–1.0)
Monocytes Relative: 8 %
Neutro Abs: 9.4 10*3/uL — ABNORMAL HIGH (ref 1.7–7.7)
Neutrophils Relative %: 86 %

## 2016-06-21 LAB — IRON AND TIBC
Iron: 11 ug/dL — ABNORMAL LOW (ref 45–182)
Saturation Ratios: 7 % — ABNORMAL LOW (ref 17.9–39.5)
TIBC: 147 ug/dL — ABNORMAL LOW (ref 250–450)
UIBC: 136 ug/dL

## 2016-06-21 LAB — URINALYSIS, ROUTINE W REFLEX MICROSCOPIC
Bilirubin Urine: NEGATIVE
Glucose, UA: NEGATIVE mg/dL
Ketones, ur: NEGATIVE mg/dL
Nitrite: NEGATIVE
Protein, ur: 100 mg/dL — AB
Specific Gravity, Urine: 1.015 (ref 1.005–1.030)
pH: 5 (ref 5.0–8.0)

## 2016-06-21 LAB — BASIC METABOLIC PANEL
Anion gap: 9 (ref 5–15)
BUN: 86 mg/dL — ABNORMAL HIGH (ref 6–20)
CO2: 16 mmol/L — ABNORMAL LOW (ref 22–32)
Calcium: 8.5 mg/dL — ABNORMAL LOW (ref 8.9–10.3)
Chloride: 114 mmol/L — ABNORMAL HIGH (ref 101–111)
Creatinine, Ser: 4.6 mg/dL — ABNORMAL HIGH (ref 0.61–1.24)
GFR calc Af Amer: 12 mL/min — ABNORMAL LOW (ref >60)
GFR calc non Af Amer: 10 mL/min — ABNORMAL LOW (ref >60)
Glucose, Bld: 94 mg/dL (ref 65–99)
Potassium: 5.7 mmol/L — ABNORMAL HIGH (ref 3.5–5.1)
Sodium: 139 mmol/L (ref 135–145)

## 2016-06-21 LAB — PROTIME-INR
INR: 1.41
Prothrombin Time: 17.4 seconds — ABNORMAL HIGH (ref 11.4–15.2)

## 2016-06-21 LAB — URINE CULTURE: Culture: >=100000 — AB

## 2016-06-21 LAB — URINE MICROSCOPIC-ADD ON

## 2016-06-21 LAB — CREATININE, URINE, RANDOM: Creatinine, Urine: 70.23 mg/dL

## 2016-06-21 LAB — SODIUM, URINE, RANDOM: Sodium, Ur: 25 mmol/L

## 2016-06-21 MED ORDER — SODIUM BICARBONATE 650 MG PO TABS
1300.0000 mg | ORAL_TABLET | Freq: Three times a day (TID) | ORAL | Status: DC
  Administered 2016-06-21 – 2016-06-26 (×14): 1300 mg via ORAL
  Filled 2016-06-21 (×14): qty 2

## 2016-06-21 MED ORDER — ACETAMINOPHEN 325 MG PO TABS
650.0000 mg | ORAL_TABLET | ORAL | Status: DC | PRN
  Administered 2016-06-21 – 2016-06-24 (×6): 650 mg via ORAL
  Filled 2016-06-21 (×6): qty 2

## 2016-06-21 NOTE — Consult Note (Signed)
Reason for Consult:AKI/CKD 4 Referring Physician: Dr. Anthony Sar Ryan Hess is an 82 y.o. male.  HPI: 82 yr male with hx severe PVD with aortic stent graft, afib, arrythmias, prostate ca, Dementia, COPD, ^Lipids, DJD, colon polyps, who was admitted on 9/23 with GIB, assoc with hypotension (sys 70-90s).  Received transfusions.  Baseline Cr apparently in low 2s was 5/39 on admit and went down as far as 4.49 on 9/26 but is 4.6 today.  Wgt is up 3 kg. He was given fluids and became SOB ,so given Lasix.  .  Had foley inserted as L hydronephrosis found on 9/24.  No f/u or furthur intervention done.[name] He does not know of past renal dz but he is severely confused and lacks recent and remote memory. Daughter does not know of it either.  Was on Ibuprofen and Losartan prior to admit. Review of systems not obtained due to patient factors.   Past Medical History:  Diagnosis Date  . Actinic skin damage   . Atrial fibrillation ([place])   . CHF (congestive heart failure) ([place])   . Colon polyps   . COPD (chronic obstructive pulmonary disease) ([place])   . Diverticulosis of colon   . DJD (degenerative joint disease)   . Drug therapy   . HTN (hypertension)   . Hypercholesteremia   . Hyperlipidemia   . Internal hemorrhoids   . Pneumonia, organism unspecified   . Prostate cancer ([place])   . Pulmonary nodule   . PVC (premature ventricular contraction)   . PVD (peripheral vascular disease) ([place])   . Thoracic aortic aneurysm [facility])     Past Surgical History:  Procedure Laterality Date  . ABDOMINAL AORTIC ANEURYSM REPAIR  1998  . APPENDECTOMY    . APPLICATION OF WOUND VAC  04/18/2012   Procedure: APPLICATION OF WOUND VAC;  Surgeon: Joyice Faster. Cornett, MD;  Location: [place];  Service: General;  Laterality: N/A;  Removal of abdominal wound vac, Application of incisional  wound vac  . INGUINAL HERNIA REPAIR     left  . JOINT REPLACEMENT    . LAPAROTOMY  04/16/2012   Procedure: EXPLORATORY LAPAROTOMY;  Surgeon:  Madilyn Hook, DO;  Location: [place];  Service: General;  Laterality: N/A;  exploratory Laparotomy,Small bowel ressection abdominal wound vac placement.  [name] LAPAROTOMY  04/18/2012   Procedure: EXPLORATORY LAPAROTOMY;  Surgeon: Joyice Faster. Cornett, MD;  Location: [place];  Service: General;  Laterality: N/A;  exploratory laparotomy with small bowel anastomosis  . repair of AA  04/14/2013   done at [place]  . TOTAL KNEE ARTHROPLASTY     bilateral    Family History  Problem Relation Age of Onset  . Dementia Mother   . Stroke Father   . Diabetes Brother   . Heart disease Brother   . Heart disease Sister   . Heart disease Brother   . Obesity Brother   . Cancer Sister     back ??  . Alzheimer's disease Sister   . Colon cancer Neg Hx     Social History:  reports that he quit smoking about 34 years ago. His smoking use included Cigarettes. He has a 88.00 pack-year smoking history. He has never used smokeless tobacco. He reports that he does not drink alcohol or use drugs.  Allergies: No Known Allergies  Medications:  I have reviewed the patient's current medications. Prior to Admission:  Prescriptions Prior to Admission  Medication Sig Dispense Refill Last Dose  . aspirin 81 MG  tablet Take 81 mg by mouth every morning.    06/14/2016  . atorvastatin (LIPITOR) 10 MG tablet TAKE 1 TABLET BY MOUTH DAILY (Patient taking differently: TAKE 1 TABLET (10 mg) BY MOUTH DAILY) 30 tablet 11 06/16/2016 at Unknown time  . Fluticasone-Salmeterol (ADVAIR DISKUS) 250-50 MCG/DOSE AEPB inhale 1 dose by mouth twice a day (Patient taking differently: Inhale 1 puff into the lungs 2 (two) times daily. ) 180 each 3 06/16/2016 at Unknown time  . furosemide (LASIX) 40 MG tablet TAKE 1 TABLET BY MOUTH DAILY (Patient taking differently: TAKE 1 TABLET (40 mg) BY MOUTH DAILY) 90 tablet 0 06/17/2016  . guaiFENesin (MUCINEX) 600 MG 12 hr tablet Take 600 mg by mouth 2 (two) times daily. COPD.   06/16/2016 at Unknown time  .  levothyroxine (SYNTHROID, LEVOTHROID) 50 MCG tablet TAKE 1 TABLET BY MOUTH DAILY (Patient taking differently: TAKE 1 TABLET (50 mcg) BY MOUTH DAILY) 90 tablet 0 06/16/2016 at Unknown time  . losartan (COZAAR) 100 MG tablet TAKE 1 TABLET BY MOUTH EVERY DAY (Patient taking differently: TAKE 1 TABLET (100 mg) BY MOUTH EVERY DAY) 90 tablet 3 06/16/2016 at Unknown time  . metoprolol succinate (TOPROL XL) 25 MG 24 hr tablet Take 1 tablet (25 mg total) by mouth daily. 90 tablet 3 06/17/2016 at 1100  . Multiple Vitamin (MULTIVITAMIN) tablet Take 1 tablet by mouth daily.     06/14/2016 at Unknown time  . tiotropium (SPIRIVA HANDIHALER) 18 MCG inhalation capsule USE 2 INHALATIONS FROM ONE CAPSULE DAILY AS DIRECTED (Patient taking differently: Place 18 mcg into inhaler and inhale. USE 2 INHALATIONS FROM ONE CAPSULE DAILY AS DIRECTED) 90 capsule 1 06/16/2016 at Unknown time  . traMADol (ULTRAM) 50 MG tablet TAKE 1 TABLET BY MOUTH THREE TIMES DAILY AS NEEDED FOR PAIN (Patient taking differently: TAKE 1 TABLET BY MOUTH once DAILY AS NEEDED FOR PAIN) 90 tablet 0 06/14/2016 at Unknown time  . warfarin (COUMADIN) 2.5 MG tablet TAKE AS DIRECTED BY ANTICOAGULATION CLINIC (Patient taking differently: TAKE 1.25 mg (1/2 tablet) every morning as DIRECTED BY ANTICOAGULATION CLINIC) 30 tablet 3 Past Week at Unknown time  . ibuprofen (ADVIL,MOTRIN) 200 MG tablet Take 200 mg by mouth every 6 (six) hours as needed for fever, headache or moderate pain.    06/15/2016    Results for orders placed or performed during the hospital encounter of 06/17/16 (from the past 48 hour(s))  CBC     Status: Abnormal   Collection Time: 06/20/16  2:41 AM  Result Value Ref Range   WBC 10.8 (H) 4.0 - 10.5 K/uL   RBC 3.14 (L) 4.22 - 5.81 MIL/uL   Hemoglobin 9.6 (L) 13.0 - 17.0 g/dL   HCT 28.7 (L) 39.0 - 52.0 %   MCV 91.4 78.0 - 100.0 fL   MCH 30.6 26.0 - 34.0 pg   MCHC 33.4 30.0 - 36.0 g/dL   RDW 15.6 (H) 11.5 - 15.5 %   Platelets 87 (L) 150 - 400  K/uL    Comment: CONSISTENT WITH PREVIOUS RESULT  Basic metabolic panel     Status: Abnormal   Collection Time: 06/20/16  2:41 AM  Result Value Ref Range   Sodium 140 135 - 145 mmol/L   Potassium 4.3 3.5 - 5.1 mmol/L    Comment: DELTA CHECK NOTED   Chloride 116 (H) 101 - 111 mmol/L   CO2 16 (L) 22 - 32 mmol/L   Glucose, Bld 99 65 - 99 mg/dL   BUN 94 (H) 6 -  20 mg/dL   Creatinine, Ser 4.49 (H) 0.61 - 1.24 mg/dL   Calcium 8.2 (L) 8.9 - 10.3 mg/dL   GFR calc non Af Amer 10 (L) >60 mL/min   GFR calc Af Amer 12 (L) >60 mL/min    Comment: (NOTE) The eGFR has been calculated using the CKD EPI equation. This calculation has not been validated in all clinical situations. eGFR's persistently <60 mL/min signify possible Chronic Kidney Disease.    Anion gap 8 5 - 15  Protime-INR     Status: Abnormal   Collection Time: 06/20/16  2:41 AM  Result Value Ref Range   Prothrombin Time 17.2 (H) 11.4 - 15.2 seconds   INR 1.39   Lactic acid, plasma     Status: None   Collection Time: 06/20/16  2:41 AM  Result Value Ref Range   Lactic Acid, Venous 1.3 0.5 - 1.9 mmol/L  Glucose, capillary     Status: None   Collection Time: 06/20/16  5:23 AM  Result Value Ref Range   Glucose-Capillary 86 65 - 99 mg/dL  Protime-INR     Status: Abnormal   Collection Time: 06/21/16  4:00 AM  Result Value Ref Range   Prothrombin Time 17.4 (H) 11.4 - 15.2 seconds   INR 1.41   CBC     Status: Abnormal   Collection Time: 06/21/16  4:00 AM  Result Value Ref Range   WBC 13.2 (H) 4.0 - 10.5 K/uL   RBC 3.47 (L) 4.22 - 5.81 MIL/uL   Hemoglobin 10.7 (L) 13.0 - 17.0 g/dL   HCT 32.6 (L) 39.0 - 52.0 %   MCV 93.9 78.0 - 100.0 fL   MCH 30.8 26.0 - 34.0 pg   MCHC 32.8 30.0 - 36.0 g/dL   RDW 16.1 (H) 11.5 - 15.5 %   Platelets 94 (L) 150 - 400 K/uL    Comment: CONSISTENT WITH PREVIOUS RESULT  Basic metabolic panel     Status: Abnormal   Collection Time: 06/21/16  4:00 AM  Result Value Ref Range   Sodium 139 135 - 145  mmol/L   Potassium 5.7 (H) 3.5 - 5.1 mmol/L    Comment: DELTA CHECK NOTED   Chloride 114 (H) 101 - 111 mmol/L   CO2 16 (L) 22 - 32 mmol/L   Glucose, Bld 94 65 - 99 mg/dL   BUN 86 (H) 6 - 20 mg/dL   Creatinine, Ser 4.60 (H) 0.61 - 1.24 mg/dL   Calcium 8.5 (L) 8.9 - 10.3 mg/dL   GFR calc non Af Amer 10 (L) >60 mL/min   GFR calc Af Amer 12 (L) >60 mL/min    Comment: (NOTE) The eGFR has been calculated using the CKD EPI equation. This calculation has not been validated in all clinical situations. eGFR's persistently <60 mL/min signify possible Chronic Kidney Disease.    Anion gap 9 5 - 15    Ct Abdomen Pelvis Wo Contrast  Result Date: 06/20/2016 CLINICAL DATA:  Diffuse abdominal pain. EXAM: CT ABDOMEN AND PELVIS WITHOUT CONTRAST TECHNIQUE: Multidetector CT imaging of the abdomen and pelvis was performed following the standard protocol without IV contrast. COMPARISON:  CT scan June 17, 2016 FINDINGS: Lower chest: Bilateral pleural effusions and underlying atelectasis are identified. Emphysematous changes are seen in the bases, most prominent in the lingula. Suggested mild edema. Coronary artery calcifications. Hepatobiliary: A calcification is either in the liver or adjacent gallbladder on series 2, image 28, unchanged. The liver and gallbladder are otherwise normal. Pancreas: Unremarkable. No  pancreatic ductal dilatation or surrounding inflammatory changes. Spleen: Normal in size without focal abnormality. Adrenals/Urinary Tract: An anterior superior right renal cyst is stable. No evidence of renal obstruction or ureteral stones. The kidneys are otherwise stable. Stomach/Bowel: The stomach and small bowel are normal. The colon is unremarkable. The appendix is not seen but there is no secondary evidence of appendicitis. Vascular/Lymphatic: An abdominal aortic aneurysm is again identified with endovascular repair. The aneurysm measures up to 7 cm, unchanged. No evidence of leak. Stents are also  seen in the celiac, superior mesenteric, and renal arteries. No adenopathy. Reproductive: No acute abnormalities in the prostate or seminal vesicles. Other: Diffuse mild increased attenuation in the fat of the abdomen and pelvis in addition to the subcutaneous tissues suggests volume overload. There is wall thickening associated with the superior anterior bladder, mildly worsened in the interval. There appears to be a catheter in the penile urethra with a rounded collection of fluid on series 2, image 107. This is suspicious for inflation of the balloon within the penile urethra. Musculoskeletal: Loss of height of L4 is stable. Degenerative changes seen within the spine. No acute bony abnormalities are identified. IMPRESSION: 1. The Foley catheter balloon is inflated within the penile urethra. Recommend repositioning. 2. Bilateral pleural effusions, mild pulmonary edema, and increased attenuation of the subcutaneous and intra-abdominal fat, all consistent with volume overload. 3. Abdominal aortic aneurysm with endovascular repair, unchanged. These results will be called to the ordering clinician or representative by the Radiologist Assistant, and communication documented in the PACS or zVision Dashboard. Electronically Signed   By: Dorise Bullion III M.D   On: 06/20/2016 15:15   Mr Brain Wo Contrast  Result Date: 06/21/2016 CLINICAL DATA:  Syncopal episode after bowel movement. Unresponsive. History of atrial fibrillation, hypertension, hypercholesterolemia. EXAM: MRI HEAD WITHOUT CONTRAST TECHNIQUE: Multiplanar, multiecho pulse sequences of the brain and surrounding structures were obtained without intravenous contrast. COMPARISON:  MRI head Feb 06, 2012 FINDINGS: Mildly motion degraded examination. BRAIN: No reduced diffusion to suggest acute ischemia. No susceptibility artifact to suggest hemorrhage. Moderate to severe stable ventriculomegaly on the basis of global parenchymal brain volume loss. No midline  shift, mass effect or masses. 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 Patchy to confluent supratentorial white matter FLAIR T2 hyperintensities. Old RIGHT thalamus lacunar infarct. Prominent bilateral basal ganglia perivascular spaces are similar. No abnormal extra-axial fluid collections. Move VASCULAR: Attenuated LEFT vertebral artery and basilar artery. Dolicoectatic internal carotid arteries present previously. SKULL AND UPPER CERVICAL SPINE: No abnormal sellar expansion. Bright T1 bone marrow signal compatible with osteopenia. No suspicious calvarial bone marrow signal. Craniocervical junction maintained. SINUSES/ORBITS: The mastoid air-cells and included paranasal sinuses are well-aerated. Status post bilateral ocular lens implants. The included ocular globes and orbital contents are non-suspicious. OTHER: None. IMPRESSION: No acute intracranial process. Stable examination including attenuated LEFT vertebral artery and basilar artery which can be seen with slow flow, occlusion or hypoplasia. Involutional changes, moderate chronic small vessel ischemic disease and old RIGHT thalamus lacunar infarct. Electronically Signed   By: Elon Alas M.D.   On: 06/21/2016 01:10   Dg Abd Portable 1v  Result Date: 06/20/2016 CLINICAL DATA:  82 year old male with abdominal pain. EXAM: PORTABLE ABDOMEN - 1 VIEW COMPARISON:  Abdominal CT dated 06/17/2016 FINDINGS: There is moderate stool throughout the colon. No bowel dilatation or evidence of obstruction. No free air or radiopaque calculi identified. There is thoracolumbar aortic aneurysm endovascular stent graft repair. Bilateral renal artery stents also noted. There is degenerative changes  of the spine. Chronic L4 compression deformity. No acute fracture. IMPRESSION: No bowel obstruction or free air. Endovascular stent graft repair of the thoracolumbar aortic aneurysm. Electronically Signed   By: Anner Crete M.D.   On: 06/20/2016 07:01    ROS Blood pressure  111/65, pulse 85, temperature 99.3 F (37.4 C), temperature source Oral, resp. rate 20, height 5' 8"  (1.727 m), weight 77.2 kg (170 lb 1.6 oz), SpO2 99 %. Physical Exam Physical Examination: General appearance - chronically ill, confused,  Mental status - Ox1, pleasant Eyes - pupils equal and reactive, extraocular eye movements intact, funduscopic exam normal, discs flat and sharp Mouth - mucous membranes moist, pharynx normal without lesions Neck - adenopathy noted PCL Lymphatics - posterior cervical nodes Chest - rales noted bibasilar, decreased air entry noted bilat Heart - irregularly irregular rhythm with rate 78, systolic murmur LG0/9 at apex Abdomen - soft, nontender, pos bs, liver down 4 cm, epigastric bruit Musculoskeletal - no joint tenderness, deformity or swelling Extremities - 1+ edema, stasis changes in legs, bilat fem bruits Skin - actinic changes in sun exposed areas, stasis changes in legs  Assessment/Plan: 1 AKI/CKD 4  Has one small kidney and the other mild hydro, now with foley.  Most likely acute injury is hemodynamic with low bps in setting of ARB and NSAID.  Will reeval hydro. Check urine chem .  With poor GFR to start, liklihood of recovery less.  Need to correct bicarb, K (RTA), and limit vol , keep neg to even.  Discussed with daughter, not RRT candidate 2 GIB stable,check Fe 3 Hypertension: avoid angiotensin active agents in this setting acute and chronic 4. Anemia follow 5. Metabolic Bone Disease: check PTH 6 Dementia 7 PVD 8 Hydronephrosis 9 Afib P UA, U/s, check diff, check Fe, Pth, conservative mgmt, ^ Na bicarb  Chasya Zenz L 06/21/2016, 4:15 PM

## 2016-06-21 NOTE — Progress Notes (Signed)
Physical Therapy Treatment Patient Details Name: Ryan Hess MRN: [id number] DOB: 1924/02/12 Today's Date: 06/21/2016    History of Present Illness Pt adm with GI bleed. PMH - afib, copd, htn, chf    PT Comments    Pt is up to walk with the therapist for only a short trip as he is having elevated pulse at 130 bedside, restricted his distance to avoid worsening the situation, and nursing aware of his level afterward.  He is appropriate to continue acutely and will expect his progression to longer gait trips.  Son is in to observe and is the caregiver at home.    Follow Up Recommendations  Home health PT;Supervision for mobility/OOB     Equipment Recommendations  None recommended by PT    Recommendations for Other Services       Precautions / Restrictions Precautions Precautions: Fall Restrictions Weight Bearing Restrictions: No    Mobility  Bed Mobility Overal bed mobility: Needs Assistance Bed Mobility: Supine to Sit     Supine to sit: Min guard;Min assist     General bed mobility comments: help to finish scooting to EOB  Transfers Overall transfer level: Needs assistance Equipment used: Rolling walker (2 wheeled);1 person hand held assist Transfers: Sit to/from [facility] Sit to Stand: Min assist Stand pivot transfers: Min assist (dense cues for sequence and safety)       General transfer comment: assisted to sequence and make pt aware of obstacles, to direct walker  Ambulation/Gait Ambulation/Gait assistance: Min guard;Min assist Ambulation Distance (Feet): 20 Feet Assistive device: Rolling walker (2 wheeled) Gait Pattern/deviations: Step-to pattern;Step-through pattern;Trunk flexed;Wide base of support;Shuffle Gait velocity: reduced Gait velocity interpretation: Below normal speed for age/gender General Gait Details: redirection of sequence and safety of the task   Stairs            Wheelchair Mobility    Modified Rankin  (Stroke Patients Only)       Balance Overall balance assessment: Needs assistance Sitting-balance support: Feet supported Sitting balance-Leahy Scale: Good     Standing balance support: Bilateral upper extremity supported Standing balance-Leahy Scale: Poor Standing balance comment: unsteady but improving                    Cognition Arousal/Alertness: Awake/alert Behavior During Therapy: Impulsive Overall Cognitive Status: Impaired/Different from baseline Area of Impairment: Attention;Following commands;Safety/judgement;Awareness   Current Attention Level: Sustained Memory: Decreased recall of precautions;Decreased short-term memory Following Commands: Follows one step commands inconsistently Safety/Judgement: Decreased awareness of safety;Decreased awareness of deficits Awareness: Intellectual Problem Solving: Slow processing;Requires verbal cues;Requires tactile cues;Difficulty sequencing General Comments: pt tries to stand even when PT is asking him to wait, son attempting to intervene unsuccesfully    Exercises      General Comments        Pertinent Vitals/Pain Pain Assessment: No/denies pain    Home Living                      Prior Function            PT Goals (current goals can now be found in the care plan section) Acute Rehab PT Goals Patient Stated Goal: return home with son Progress towards PT goals: Progressing toward goals    Frequency    Min 3X/week      PT Plan Current plan remains appropriate    Co-evaluation             End of Session Equipment Utilized  During Treatment: Gait belt Activity Tolerance: Patient tolerated treatment well Patient left: in chair;with call bell/phone within reach;with chair alarm set;with family/visitor present     Time: [id number] PT Time Calculation (min) (ACUTE ONLY): 27 min  Charges:  $Gait Training: 8-22 mins $Therapeutic Activity: 8-22 mins                    G Codes:       Ryan Hess 06-22-16, 10:51 AM    Mee Hives, PT MS Acute Rehab Dept. Number: [place]

## 2016-06-21 NOTE — Progress Notes (Signed)
Family has put up 4th bedrail, they have been educated and wish to keep it up. Will continue to monitor.

## 2016-06-21 NOTE — Progress Notes (Signed)
PROGRESS NOTE    Ryan Hess  [id number] DOB: 1924-03-27 DOA: 06/17/2016 PCP: Noralee Space, MD    Brief Narrative:  Ryan Hess is a 82 y.o. male with medical history significant of complex TAAA s/p EVAR at [facility], CKD stage 3, A.Fib on coumadin, HTN.  Patient presents to the ED with 3 weeks of intermittent melena that became consistent melena for the past 3 days or so.  No h/o N/V or abdominal pain.  No fever, no cough, no congestion.  Patient's family report that he is so weak he cannot walk which is why they brought him in.  Weakness is generalized without specific neurologic symptoms. He presented with hypotension, SBP in the 60s, AKI, UTI, melana, anemia, and supratherapeutic INR. He received vitamin K, pRBC transfusion, PPI. GI has seen patient and has signed off.   On 9/26, he developed an unresponsive episode while having Bm, possibly due to vagal response. MRI brain was ordered, which was negative for acute changes. Urology was consulted for mild hydro and recommended keep foley at time of discharge and outpatient follow up with urology.   Assessment & Plan:   Principal Problem:   Melena Active Problems:   Essential hypertension   Atrial fibrillation (HCC)   COPD (chronic obstructive pulmonary disease) with chronic bronchitis (HCC)   Acute blood loss anemia   Chronic systolic congestive heart failure (HCC)   Acute kidney failure (HCC)   UTI (lower urinary tract infection)   GI bleed   Metabolic acidosis, NAG, bicarbonate losses   CKD (chronic kidney disease) stage 3, GFR 30-59 ml/min   Pressure injury of skin   Melena with acute blood loss anemia -S/p prbc transfusion x2 units -Hypotension resolved with volume resuscitation  -No further bleeding -GI signed off  -Spoke with daughter regarding restarting Coumadin. We need to weigh the benefits and risks of restarting blood thinner since patient has had melanotic stools versus benefit offered for stroke prevention.  Will further discuss with patient's son and primary care giver Coralyn Mark tomorrow.   AKI on CKD III -Cr on admission is 5.39, baseline around 2 -AKI likely due to uti/dehydration/gi bleed/obstruction  -Renal US on 9/24 new mild left sided hydronephrosis, new since ct ab on 9/23, thick walled bladder with debris, foley placed, urology consulted, per urology will keep foley in at discharge and outpatient follow up with urology. -Patient/family should call [facility] on discharge to schedule follow up appointment with Dr. Jeffie Pollock or partner.  -Consult Nephrology today   Klebsiella UTI, recurrent, unknown if present on admission -Ancef   Hyperkalemia -Iatrogenic -Stop supplementation  Metabolic acidosis  -RTA?  -Was on bicarb drip, changed to oral supplement -Consult Nephrology today   Chronic afib -Rate controlled, betablocker held initially due to hypotension -Lopressor restarted  -On coumadin at home which is held since admission due to gi bleed  Chronic systolic chf -Last EF in [id number] 45-50%  COPD -Stable  -Continue home meds  Dementia -Not oriented to time or place,very supportive family in room 24/7 for assistance  Thrombocytopenia -Monitor trend. No exposure to heparin product, no physical signs to suggest DVT.  Bilateral pleural effusion -IVF discontinued  HLD -Lipitor   Hypothyroidism -Synthroid   AAA s/p endoscopic repair -7cm , unchanged   DVT prophylaxis: SCDs Code Status: DNR Family Communication: daughter at bedside Disposition Plan: Will consult Nephrology today. Patient had significant renal failure at time of admission and was given volume resuscitation. Now more fluid overloaded. Patient also has  metabolic acidosis.    Consultants:   [facility]   Nephrology   Procedures:   pRBC transfusion x2units on 9/23-24night  Antimicrobials:   Rocephin from 9/23 to 9/26  Cefazolin from 9/26 >>>     Subjective: Patient remains confused in bed. Daughter is at bedside. No acute events overnight.  Objective: Vitals:   06/21/16 0750 06/21/16 0800 06/21/16 0801 06/21/16 1142  BP:    111/65  Pulse:    85  Resp:    20  Temp:    99.3 F (37.4 C)  TempSrc:    Oral  SpO2: 100% 100% 100% 99%  Weight:      Height:        Intake/Output Summary (Last 24 hours) at 06/21/16 1430 Last data filed at 06/21/16 0907  Gross per 24 hour  Intake             1267 ml  Output              900 ml  Net              367 ml   Filed Weights   06/18/16 0145 06/19/16 2231 06/21/16 0655  Weight: 74.7 kg (164 lb 11.2 oz) 77 kg (169 lb 12.1 oz) 77.2 kg (170 lb 1.6 oz)    Examination:  General exam: Appears calm and comfortable, Confused Respiratory system: Clear to auscultation. Respiratory effort normal. Cardiovascular system: S1 & S2 heard, RRR. No JVD, murmurs, rubs, gallops or clicks. Gastrointestinal system: Abdomen is nondistended, soft and nontender. No organomegaly or masses felt. Normal bowel sounds heard. Central nervous system: Alert and not oriented Extremities: Moves all extremities spontaneously  Data Reviewed: I have personally reviewed following labs and imaging studies  CBC:  Recent Labs Lab 06/17/16 1730 06/18/16 0152 06/19/16 0245 06/20/16 0241 06/21/16 0400  WBC 9.5 9.6 8.6 10.8* 13.2*  NEUTROABS 7.5  --   --   --   --   HGB 8.1* 10.4*  10.5* 9.4* 9.6* 10.7*  HCT 23.6* 31.1*  31.1* 27.4* 28.7* 32.6*  MCV 92.5 91.5 90.1 91.4 93.9  PLT 134* 122* 95* 87* 94*   Basic Metabolic Panel:  Recent Labs Lab 06/17/16 1730 06/18/16 0152 06/19/16 0245 06/20/16 0241 06/21/16 0400  NA 137 140 141 140 139  K 3.3* 3.4* 2.8* 4.3 5.7*  CL 113* 115* 115* 116* 114*  CO2 12* 11* 17* 16* 16*  GLUCOSE 105* 94 94 99 94  BUN 141* 124* 111* 94* 86*  CREATININE 5.39* 4.84* 4.63* 4.49* 4.60*  CALCIUM 8.1* 8.0* 7.9* 8.2* 8.5*   GFR: Estimated Creatinine Clearance: 10.3 mL/min  (by C-G formula based on SCr of 4.6 mg/dL (H)). Liver Function Tests:  Recent Labs Lab 06/17/16 1730  AST 20  ALT 17  ALKPHOS 93  BILITOT 1.1  PROT 4.9*  ALBUMIN 2.5*   No results for input(s): LIPASE, AMYLASE in the last 168 hours. No results for input(s): AMMONIA in the last 168 hours. Coagulation Profile:  Recent Labs Lab 06/17/16 1730 06/18/16 0152 06/19/16 0906 06/20/16 0241 06/21/16 0400  INR 3.15 2.04 1.38 1.39 1.41   Cardiac Enzymes:  Recent Labs Lab 06/17/16 1730  TROPONINI 0.04*   BNP (last 3 results)  Recent Labs  12/14/15 1219  PROBNP 462.0*   HbA1C: No results for input(s): HGBA1C in the last 72 hours. CBG:  Recent Labs Lab 06/20/16 0523  GLUCAP 86   Lipid Profile: No results for input(s): CHOL, HDL, LDLCALC, TRIG,  CHOLHDL, LDLDIRECT in the last 72 hours. Thyroid Function Tests:  Recent Labs  06/19/16 0246  TSH 5.310*   Anemia Panel: No results for input(s): VITAMINB12, FOLATE, FERRITIN, TIBC, IRON, RETICCTPCT in the last 72 hours. Sepsis Labs:  Recent Labs Lab 06/17/16 1744 06/20/16 0241  LATICACIDVEN 1.62 1.3    Recent Results (from the past 240 hour(s))  Urine culture     Status: Abnormal   Collection Time: 06/17/16  5:00 PM  Result Value Ref Range Status   Specimen Description URINE, RANDOM  Final   Special Requests NONE  Final   Culture 50,000 COLONIES/mL KLEBSIELLA PNEUMONIAE (A)  Final   Report Status 06/19/2016 FINAL  Final   Organism ID, Bacteria KLEBSIELLA PNEUMONIAE (A)  Final      Susceptibility   Klebsiella pneumoniae - MIC*    AMPICILLIN >=32 RESISTANT Resistant     CEFAZOLIN <=4 SENSITIVE Sensitive     CEFTRIAXONE <=1 SENSITIVE Sensitive     CIPROFLOXACIN <=0.25 SENSITIVE Sensitive     GENTAMICIN <=1 SENSITIVE Sensitive     IMIPENEM <=0.25 SENSITIVE Sensitive     NITROFURANTOIN 32 SENSITIVE Sensitive     TRIMETH/SULFA <=20 SENSITIVE Sensitive     AMPICILLIN/SULBACTAM 4 SENSITIVE Sensitive     PIP/TAZO  <=4 SENSITIVE Sensitive     Extended ESBL NEGATIVE Sensitive     * 50,000 COLONIES/mL KLEBSIELLA PNEUMONIAE  MRSA PCR Screening     Status: None   Collection Time: 06/18/16  1:44 AM  Result Value Ref Range Status   MRSA by PCR NEGATIVE NEGATIVE Final    Comment:        The GeneXpert MRSA Assay (FDA approved for NASAL specimens only), is one component of a comprehensive MRSA colonization surveillance program. It is not intended to diagnose MRSA infection nor to guide or monitor treatment for MRSA infections.   Urine culture     Status: Abnormal   Collection Time: 06/18/16 10:45 AM  Result Value Ref Range Status   Specimen Description URINE, CLEAN CATCH  Final   Special Requests NONE  Final   Culture >=100,000 COLONIES/mL KLEBSIELLA PNEUMONIAE (A)  Final   Report Status 06/21/2016 FINAL  Final   Organism ID, Bacteria KLEBSIELLA PNEUMONIAE (A)  Final      Susceptibility   Klebsiella pneumoniae - MIC*    AMPICILLIN >=32 RESISTANT Resistant     CEFAZOLIN <=4 SENSITIVE Sensitive     CEFTRIAXONE <=1 SENSITIVE Sensitive     CIPROFLOXACIN <=0.25 SENSITIVE Sensitive     GENTAMICIN <=1 SENSITIVE Sensitive     IMIPENEM <=0.25 SENSITIVE Sensitive     NITROFURANTOIN 32 SENSITIVE Sensitive     TRIMETH/SULFA <=20 SENSITIVE Sensitive     AMPICILLIN/SULBACTAM 4 SENSITIVE Sensitive     PIP/TAZO <=4 SENSITIVE Sensitive     Extended ESBL NEGATIVE Sensitive     * >=100,000 COLONIES/mL KLEBSIELLA PNEUMONIAE  Culture, blood (routine x 2)     Status: None (Preliminary result)   Collection Time: 06/18/16  6:29 PM  Result Value Ref Range Status   Specimen Description BLOOD LEFT ANTECUBITAL  Final   Special Requests BOTTLES DRAWN AEROBIC AND ANAEROBIC 5CC  Final   Culture NO GROWTH 2 DAYS  Final   Report Status PENDING  Incomplete  Culture, blood (routine x 2)     Status: None (Preliminary result)   Collection Time: 06/18/16  6:36 PM  Result Value Ref Range Status   Specimen Description BLOOD  LEFT ARM  Final   Special Requests BOTTLES  DRAWN AEROBIC AND ANAEROBIC 5CC  Final   Culture NO GROWTH 2 DAYS  Final   Report Status PENDING  Incomplete         Radiology Studies: Ct Abdomen Pelvis Wo Contrast  Result Date: 06/20/2016 CLINICAL DATA:  Diffuse abdominal pain. EXAM: CT ABDOMEN AND PELVIS WITHOUT CONTRAST TECHNIQUE: Multidetector CT imaging of the abdomen and pelvis was performed following the standard protocol without IV contrast. COMPARISON:  CT scan June 17, 2016 FINDINGS: Lower chest: Bilateral pleural effusions and underlying atelectasis are identified. Emphysematous changes are seen in the bases, most prominent in the lingula. Suggested mild edema. Coronary artery calcifications. Hepatobiliary: A calcification is either in the liver or adjacent gallbladder on series 2, image 28, unchanged. The liver and gallbladder are otherwise normal. Pancreas: Unremarkable. No pancreatic ductal dilatation or surrounding inflammatory changes. Spleen: Normal in size without focal abnormality. Adrenals/Urinary Tract: An anterior superior right renal cyst is stable. No evidence of renal obstruction or ureteral stones. The kidneys are otherwise stable. Stomach/Bowel: The stomach and small bowel are normal. The colon is unremarkable. The appendix is not seen but there is no secondary evidence of appendicitis. Vascular/Lymphatic: An abdominal aortic aneurysm is again identified with endovascular repair. The aneurysm measures up to 7 cm, unchanged. No evidence of leak. Stents are also seen in the celiac, superior mesenteric, and renal arteries. No adenopathy. Reproductive: No acute abnormalities in the prostate or seminal vesicles. Other: Diffuse mild increased attenuation in the fat of the abdomen and pelvis in addition to the subcutaneous tissues suggests volume overload. There is wall thickening associated with the superior anterior bladder, mildly worsened in the interval. There appears to be a  catheter in the penile urethra with a rounded collection of fluid on series 2, image 107. This is suspicious for inflation of the balloon within the penile urethra. Musculoskeletal: Loss of height of L4 is stable. Degenerative changes seen within the spine. No acute bony abnormalities are identified. IMPRESSION: 1. The Foley catheter balloon is inflated within the penile urethra. Recommend repositioning. 2. Bilateral pleural effusions, mild pulmonary edema, and increased attenuation of the subcutaneous and intra-abdominal fat, all consistent with volume overload. 3. Abdominal aortic aneurysm with endovascular repair, unchanged. These results will be called to the ordering clinician or representative by the Radiologist Assistant, and communication documented in the PACS or zVision Dashboard. Electronically Signed   By: Dorise Bullion III M.D   On: 06/20/2016 15:15   Mr Brain Wo Contrast  Result Date: 06/21/2016 CLINICAL DATA:  Syncopal episode after bowel movement. Unresponsive. History of atrial fibrillation, hypertension, hypercholesterolemia. EXAM: MRI HEAD WITHOUT CONTRAST TECHNIQUE: Multiplanar, multiecho pulse sequences of the brain and surrounding structures were obtained without intravenous contrast. COMPARISON:  MRI head Feb 06, 2012 FINDINGS: Mildly motion degraded examination. BRAIN: No reduced diffusion to suggest acute ischemia. No susceptibility artifact to suggest hemorrhage. Moderate to severe stable ventriculomegaly on the basis of global parenchymal brain volume loss. No midline shift, mass effect or masses. 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 Patchy to confluent supratentorial white matter FLAIR T2 hyperintensities. Old RIGHT thalamus lacunar infarct. Prominent bilateral basal ganglia perivascular spaces are similar. No abnormal extra-axial fluid collections. Move VASCULAR: Attenuated LEFT vertebral artery and basilar artery. Dolicoectatic internal carotid arteries present previously. SKULL  AND UPPER CERVICAL SPINE: No abnormal sellar expansion. Bright T1 bone marrow signal compatible with osteopenia. No suspicious calvarial bone marrow signal. Craniocervical junction maintained. SINUSES/ORBITS: The mastoid air-cells and included paranasal sinuses are well-aerated. Status post bilateral ocular lens  implants. The included ocular globes and orbital contents are non-suspicious. OTHER: None. IMPRESSION: No acute intracranial process. Stable examination including attenuated LEFT vertebral artery and basilar artery which can be seen with slow flow, occlusion or hypoplasia. Involutional changes, moderate chronic small vessel ischemic disease and old RIGHT thalamus lacunar infarct. Electronically Signed   By: Elon Alas M.D.   On: 06/21/2016 01:10   Dg Abd Portable 1v  Result Date: 06/20/2016 CLINICAL DATA:  82 year old male with abdominal pain. EXAM: PORTABLE ABDOMEN - 1 VIEW COMPARISON:  Abdominal CT dated 06/17/2016 FINDINGS: There is moderate stool throughout the colon. No bowel dilatation or evidence of obstruction. No free air or radiopaque calculi identified. There is thoracolumbar aortic aneurysm endovascular stent graft repair. Bilateral renal artery stents also noted. There is degenerative changes of the spine. Chronic L4 compression deformity. No acute fracture. IMPRESSION: No bowel obstruction or free air. Endovascular stent graft repair of the thoracolumbar aortic aneurysm. Electronically Signed   By: Anner Crete M.D.   On: 06/20/2016 07:01        Scheduled Meds: . atorvastatin  10 mg Oral q1800  .  ceFAZolin (ANCEF) IV  1 g Intravenous Q12H  . famotidine (PEPCID) IV  20 mg Intravenous Q24H  . fluticasone furoate-vilanterol  1 puff Inhalation Daily  . levothyroxine  50 mcg Oral QAC breakfast  . mouth rinse  15 mL Mouth Rinse BID  . metoprolol tartrate  12.5 mg Oral BID  . polyethylene glycol  17 g Oral Daily  . senna-docusate  2 tablet Oral BID  . sodium  bicarbonate  650 mg Oral QID  . tiotropium  18 mcg Inhalation Daily   Continuous Infusions:    LOS: 4 days    Time spent: 40 minutes    Dessa Phi, DO [facility] Pager [phone]  If 7PM-7AM, please contact night-coverage [email or web address] Password [email or web address] 06/21/2016, 2:30 PM

## 2016-06-21 NOTE — Care Management Note (Signed)
Case Management Note  Patient Details  Name: Ryan Hess MRN: [id number] Date of Birth: 05-Apr-1924  Subjective/Objective:          Admitted with Melena          Action/Plan: Patient lives at home with his son ( primary caregiver). CM talked to pt with his daughter Butch Penny at the bedside; [place] choice offered, daughter chose [facility]; Manuela Schwartz with [facility] called for arrangements. Attending MD at discharge please enter the face to face document in Epic for [facility] services.  Expected Discharge Date:  Possibly 06/22/2016               Expected Discharge Plan:  [place]  Discharge planning Services  CM Consult  Choice offered to:  Adult Children  HH Arranged:  RN, PT HH Agency:  [facility]  Status of Service:  In process, will continue to follow  Sherrilyn Rist [id number] 06/21/2016, 3:17 PM

## 2016-06-22 ENCOUNTER — Other Ambulatory Visit (HOSPITAL_COMMUNITY): Payer: Medicare Other

## 2016-06-22 ENCOUNTER — Inpatient Hospital Stay (HOSPITAL_COMMUNITY): Payer: Medicare Other

## 2016-06-22 DIAGNOSIS — N179 Acute kidney failure, unspecified: Secondary | ICD-10-CM

## 2016-06-22 DIAGNOSIS — M6281 Muscle weakness (generalized): Secondary | ICD-10-CM

## 2016-06-22 DIAGNOSIS — L89329 Pressure ulcer of left buttock, unspecified stage: Secondary | ICD-10-CM

## 2016-06-22 DIAGNOSIS — I716 Thoracoabdominal aortic aneurysm, without rupture: Secondary | ICD-10-CM

## 2016-06-22 LAB — PARATHYROID HORMONE, INTACT (NO CA): PTH: 77 pg/mL — ABNORMAL HIGH (ref 15–65)

## 2016-06-22 LAB — CBC WITH DIFFERENTIAL/PLATELET
Basophils Absolute: 0 10*3/uL (ref 0.0–0.1)
Basophils Relative: 0 %
Eosinophils Absolute: 0.2 10*3/uL (ref 0.0–0.7)
Eosinophils Relative: 3 %
HCT: 29.8 % — ABNORMAL LOW (ref 39.0–52.0)
Hemoglobin: 9.9 g/dL — ABNORMAL LOW (ref 13.0–17.0)
Lymphocytes Relative: 8 %
Lymphs Abs: 0.7 10*3/uL (ref 0.7–4.0)
MCH: 31.7 pg (ref 26.0–34.0)
MCHC: 33.2 g/dL (ref 30.0–36.0)
MCV: 95.5 fL (ref 78.0–100.0)
Monocytes Absolute: 0.9 10*3/uL (ref 0.1–1.0)
Monocytes Relative: 11 %
Neutro Abs: 6.8 10*3/uL (ref 1.7–7.7)
Neutrophils Relative %: 79 %
Platelets: 80 10*3/uL — ABNORMAL LOW (ref 150–400)
RBC: 3.12 MIL/uL — ABNORMAL LOW (ref 4.22–5.81)
RDW: 16.4 % — ABNORMAL HIGH (ref 11.5–15.5)
WBC: 8.7 10*3/uL (ref 4.0–10.5)

## 2016-06-22 LAB — RENAL FUNCTION PANEL
Albumin: 2.4 g/dL — ABNORMAL LOW (ref 3.5–5.0)
Anion gap: 9 (ref 5–15)
BUN: 80 mg/dL — ABNORMAL HIGH (ref 6–20)
CO2: 15 mmol/L — ABNORMAL LOW (ref 22–32)
Calcium: 8.3 mg/dL — ABNORMAL LOW (ref 8.9–10.3)
Chloride: 116 mmol/L — ABNORMAL HIGH (ref 101–111)
Creatinine, Ser: 4.7 mg/dL — ABNORMAL HIGH (ref 0.61–1.24)
GFR calc Af Amer: 11 mL/min — ABNORMAL LOW (ref >60)
GFR calc non Af Amer: 10 mL/min — ABNORMAL LOW (ref >60)
Glucose, Bld: 70 mg/dL (ref 65–99)
Phosphorus: 3.3 mg/dL (ref 2.5–4.6)
Potassium: 5.6 mmol/L — ABNORMAL HIGH (ref 3.5–5.1)
Sodium: 140 mmol/L (ref 135–145)

## 2016-06-22 LAB — PROTIME-INR
INR: 1.48
Prothrombin Time: 18 seconds — ABNORMAL HIGH (ref 11.4–15.2)

## 2016-06-22 MED ORDER — FAMOTIDINE 20 MG PO TABS
20.0000 mg | ORAL_TABLET | Freq: Every day | ORAL | Status: DC
  Administered 2016-06-22 – 2016-06-26 (×5): 20 mg via ORAL
  Filled 2016-06-22 (×5): qty 1

## 2016-06-22 NOTE — Progress Notes (Signed)
PROGRESS NOTE    Ryan Hess  [id number] DOB: January 05, 1924 DOA: 06/17/2016 PCP: Noralee Space, MD    Brief Narrative:  Ryan Hess is a 82 y.o. male with medical history significant of complex TAAA s/p EVAR at [facility], CKD stage 3, A.Fib on coumadin, HTN.  Patient presents to the ED with 3 weeks of intermittent melena that became consistent melena for the past 3 days or so.  No h/o N/V or abdominal pain.  No fever, no cough, no congestion.  Patient's family report that he is so weak he cannot walk which is why they brought him in.  Weakness is generalized without specific neurologic symptoms. He presented with hypotension, SBP in the 60s, AKI, UTI, melana, anemia, and supratherapeutic INR. He received vitamin K, pRBC transfusion, PPI. GI has seen patient and has signed off.   On 9/26, he developed an unresponsive episode while having Bm, possibly due to vagal response. MRI brain was ordered, which was negative for acute changes. Urology was consulted for mild hydro and recommended keep foley at time of discharge and outpatient follow up with urology.    Assessment & Plan:   Principal Problem:   GI bleed Active Problems:   Essential hypertension   Atrial fibrillation (HCC)   COPD (chronic obstructive pulmonary disease) with chronic bronchitis (HCC)   Acute blood loss anemia   Chronic systolic congestive heart failure (HCC)   Acute kidney failure (HCC)   Melena   UTI (lower urinary tract infection)   Metabolic acidosis, NAG, bicarbonate losses   CKD (chronic kidney disease) stage 3, GFR 30-59 ml/min   Pressure injury of skin   Melena with acute blood loss anemia -S/p prbc transfusion x2 units -Hypotension resolved with volume resuscitation  -No further bleeding -GI signed off  -Spoke with daughter and son regarding restarting Coumadin. We need to weigh the benefits and risks of restarting blood thinner since patient has had melanotic stools versus benefit offered for stroke  prevention. No decision yet. Continue to hold.   AKI on CKD III -Cr on admission is 5.39, baseline around 2 -AKI likely due to uti/dehydration/gi bleed/obstruction  -Renal US on 9/24 new mild left sided hydronephrosis, new since ct ab on 9/23, thick walled bladder with debris, foley placed, urology consulted, per urology will keep foley in at discharge and outpatient follow up with urology. -Patient/family should call [facility] on discharge to schedule follow up appointment with Dr. Jeffie Pollock or partner.  -Appreciate Nephrology consult   Klebsiella UTI, recurrent, unknown if present on admission -Ancef   Hyperkalemia -Iatrogenic -Stop supplementation  Metabolic acidosis  -Was on bicarb drip, changed to oral supplement -Appreciate Nephrology consult   Chronic afib -Rate controlled, betablocker held initially due to hypotension -Lopressor restarted  -On coumadin at home which is held since admission due to gi bleed  Chronic systolic chf -Last EF in [id number] 45-50%  COPD -Stable  -Continue home meds  Dementia -Not oriented to time or place,very supportive family in room 24/7 for assistance  Thrombocytopenia -Monitor trend. No exposure to heparin product, no physical signs to suggest DVT.  Bilateral pleural effusion -IVF discontinued  HLD -Lipitor   Hypothyroidism -Synthroid   AAA s/p endoscopic repair -7cm , unchanged   DVT prophylaxis: SCDs Code Status: DNR Family Communication: daughter and son at bedside Disposition Plan: Continue to monitor kidney function. Patient without any improvement today.   Consultants:   [facility]   Nephrology   Procedures:   pRBC  transfusion x2units on 9/23-24night  Antimicrobials:   Rocephin from 9/23 to 9/26  Cefazolin from 9/26 >>>    Subjective: Patient remains confused in bed. Daughter and son are at bedside. No acute events overnight.   Objective: Vitals:   06/21/16 2101  06/22/16 0640 06/22/16 0645 06/22/16 1215  BP: 104/65  126/84 (!) 122/59  Pulse: 84  70 97  Resp: '20  20 18  '$ Temp: 97.9 F (36.6 C)  97.6 F (36.4 C) 98.3 F (36.8 C)  TempSrc: Oral  Oral Oral  SpO2: 96%  97% 97%  Weight:  75.4 kg (166 lb 4.8 oz)    Height:        Intake/Output Summary (Last 24 hours) at 06/22/16 1320 Last data filed at 06/22/16 0818  Gross per 24 hour  Intake              410 ml  Output             1320 ml  Net             -910 ml   Filed Weights   06/19/16 2231 06/21/16 0655 06/22/16 0640  Weight: 77 kg (169 lb 12.1 oz) 77.2 kg (170 lb 1.6 oz) 75.4 kg (166 lb 4.8 oz)    Examination:  General exam: Appears calm and comfortable, Confused Respiratory system: Clear to auscultation. Respiratory effort normal. Cardiovascular system: S1 & S2 heard, RRR. No JVD, murmurs, rubs, gallops or clicks. Gastrointestinal system: Abdomen is nondistended, soft and nontender. No organomegaly or masses felt. Normal bowel sounds heard. Central nervous system: Alert and not oriented Extremities: Moves all extremities spontaneously  Data Reviewed: I have personally reviewed following labs and imaging studies  CBC:  Recent Labs Lab 06/17/16 1730 06/18/16 0152 06/19/16 0245 06/20/16 0241 06/21/16 0400 06/21/16 1640 06/22/16 0509  WBC 9.5 9.6 8.6 10.8* 13.2*  --  8.7  NEUTROABS 7.5  --   --   --   --  9.4* 6.8  HGB 8.1* 10.4*  10.5* 9.4* 9.6* 10.7*  --  9.9*  HCT 23.6* 31.1*  31.1* 27.4* 28.7* 32.6*  --  29.8*  MCV 92.5 91.5 90.1 91.4 93.9  --  95.5  PLT 134* 122* 95* 87* 94*  --  80*   Basic Metabolic Panel:  Recent Labs Lab 06/18/16 0152 06/19/16 0245 06/20/16 0241 06/21/16 0400 06/22/16 0517  NA 140 141 140 139 140  K 3.4* 2.8* 4.3 5.7* 5.6*  CL 115* 115* 116* 114* 116*  CO2 11* 17* 16* 16* 15*  GLUCOSE 94 94 99 94 70  BUN 124* 111* 94* 86* 80*  CREATININE 4.84* 4.63* 4.49* 4.60* 4.70*  CALCIUM 8.0* 7.9* 8.2* 8.5* 8.3*  PHOS  --   --   --   --  3.3     GFR: Estimated Creatinine Clearance: 10.1 mL/min (by C-G formula based on SCr of 4.7 mg/dL (H)). Liver Function Tests:  Recent Labs Lab 06/17/16 1730 06/22/16 0517  AST 20  --   ALT 17  --   ALKPHOS 93  --   BILITOT 1.1  --   PROT 4.9*  --   ALBUMIN 2.5* 2.4*   No results for input(s): LIPASE, AMYLASE in the last 168 hours. No results for input(s): AMMONIA in the last 168 hours. Coagulation Profile:  Recent Labs Lab 06/18/16 0152 06/19/16 0906 06/20/16 0241 06/21/16 0400 06/22/16 0509  INR 2.04 1.38 1.39 1.41 1.48   Cardiac Enzymes:  Recent Labs Lab 06/17/16 1730  TROPONINI 0.04*   BNP (last 3 results)  Recent Labs  12/14/15 1219  PROBNP 462.0*   HbA1C: No results for input(s): HGBA1C in the last 72 hours. CBG:  Recent Labs Lab 06/20/16 0523  GLUCAP 86   Lipid Profile: No results for input(s): CHOL, HDL, LDLCALC, TRIG, CHOLHDL, LDLDIRECT in the last 72 hours. Thyroid Function Tests: No results for input(s): TSH, T4TOTAL, FREET4, T3FREE, THYROIDAB in the last 72 hours. Anemia Panel:  Recent Labs  06/21/16 1640  TIBC 147*  IRON 11*   Sepsis Labs:  Recent Labs Lab 06/17/16 1744 06/20/16 0241  LATICACIDVEN 1.62 1.3    Recent Results (from the past 240 hour(s))  Urine culture     Status: Abnormal   Collection Time: 06/17/16  5:00 PM  Result Value Ref Range Status   Specimen Description URINE, RANDOM  Final   Special Requests NONE  Final   Culture 50,000 COLONIES/mL KLEBSIELLA PNEUMONIAE (A)  Final   Report Status 06/19/2016 FINAL  Final   Organism ID, Bacteria KLEBSIELLA PNEUMONIAE (A)  Final      Susceptibility   Klebsiella pneumoniae - MIC*    AMPICILLIN >=32 RESISTANT Resistant     CEFAZOLIN <=4 SENSITIVE Sensitive     CEFTRIAXONE <=1 SENSITIVE Sensitive     CIPROFLOXACIN <=0.25 SENSITIVE Sensitive     GENTAMICIN <=1 SENSITIVE Sensitive     IMIPENEM <=0.25 SENSITIVE Sensitive     NITROFURANTOIN 32 SENSITIVE Sensitive      TRIMETH/SULFA <=20 SENSITIVE Sensitive     AMPICILLIN/SULBACTAM 4 SENSITIVE Sensitive     PIP/TAZO <=4 SENSITIVE Sensitive     Extended ESBL NEGATIVE Sensitive     * 50,000 COLONIES/mL KLEBSIELLA PNEUMONIAE  MRSA PCR Screening     Status: None   Collection Time: 06/18/16  1:44 AM  Result Value Ref Range Status   MRSA by PCR NEGATIVE NEGATIVE Final    Comment:        The GeneXpert MRSA Assay (FDA approved for NASAL specimens only), is one component of a comprehensive MRSA colonization surveillance program. It is not intended to diagnose MRSA infection nor to guide or monitor treatment for MRSA infections.   Urine culture     Status: Abnormal   Collection Time: 06/18/16 10:45 AM  Result Value Ref Range Status   Specimen Description URINE, CLEAN CATCH  Final   Special Requests NONE  Final   Culture >=100,000 COLONIES/mL KLEBSIELLA PNEUMONIAE (A)  Final   Report Status 06/21/2016 FINAL  Final   Organism ID, Bacteria KLEBSIELLA PNEUMONIAE (A)  Final      Susceptibility   Klebsiella pneumoniae - MIC*    AMPICILLIN >=32 RESISTANT Resistant     CEFAZOLIN <=4 SENSITIVE Sensitive     CEFTRIAXONE <=1 SENSITIVE Sensitive     CIPROFLOXACIN <=0.25 SENSITIVE Sensitive     GENTAMICIN <=1 SENSITIVE Sensitive     IMIPENEM <=0.25 SENSITIVE Sensitive     NITROFURANTOIN 32 SENSITIVE Sensitive     TRIMETH/SULFA <=20 SENSITIVE Sensitive     AMPICILLIN/SULBACTAM 4 SENSITIVE Sensitive     PIP/TAZO <=4 SENSITIVE Sensitive     Extended ESBL NEGATIVE Sensitive     * >=100,000 COLONIES/mL KLEBSIELLA PNEUMONIAE  Culture, blood (routine x 2)     Status: None (Preliminary result)   Collection Time: 06/18/16  6:29 PM  Result Value Ref Range Status   Specimen Description BLOOD LEFT ANTECUBITAL  Final   Special Requests BOTTLES DRAWN AEROBIC AND ANAEROBIC 5CC  Final   Culture NO GROWTH  3 DAYS  Final   Report Status PENDING  Incomplete  Culture, blood (routine x 2)     Status: None (Preliminary result)     Collection Time: 06/18/16  6:36 PM  Result Value Ref Range Status   Specimen Description BLOOD LEFT ARM  Final   Special Requests BOTTLES DRAWN AEROBIC AND ANAEROBIC 5CC  Final   Culture NO GROWTH 3 DAYS  Final   Report Status PENDING  Incomplete         Radiology Studies: Ct Abdomen Pelvis Wo Contrast  Result Date: 06/20/2016 CLINICAL DATA:  Diffuse abdominal pain. EXAM: CT ABDOMEN AND PELVIS WITHOUT CONTRAST TECHNIQUE: Multidetector CT imaging of the abdomen and pelvis was performed following the standard protocol without IV contrast. COMPARISON:  CT scan June 17, 2016 FINDINGS: Lower chest: Bilateral pleural effusions and underlying atelectasis are identified. Emphysematous changes are seen in the bases, most prominent in the lingula. Suggested mild edema. Coronary artery calcifications. Hepatobiliary: A calcification is either in the liver or adjacent gallbladder on series 2, image 28, unchanged. The liver and gallbladder are otherwise normal. Pancreas: Unremarkable. No pancreatic ductal dilatation or surrounding inflammatory changes. Spleen: Normal in size without focal abnormality. Adrenals/Urinary Tract: An anterior superior right renal cyst is stable. No evidence of renal obstruction or ureteral stones. The kidneys are otherwise stable. Stomach/Bowel: The stomach and small bowel are normal. The colon is unremarkable. The appendix is not seen but there is no secondary evidence of appendicitis. Vascular/Lymphatic: An abdominal aortic aneurysm is again identified with endovascular repair. The aneurysm measures up to 7 cm, unchanged. No evidence of leak. Stents are also seen in the celiac, superior mesenteric, and renal arteries. No adenopathy. Reproductive: No acute abnormalities in the prostate or seminal vesicles. Other: Diffuse mild increased attenuation in the fat of the abdomen and pelvis in addition to the subcutaneous tissues suggests volume overload. There is wall thickening  associated with the superior anterior bladder, mildly worsened in the interval. There appears to be a catheter in the penile urethra with a rounded collection of fluid on series 2, image 107. This is suspicious for inflation of the balloon within the penile urethra. Musculoskeletal: Loss of height of L4 is stable. Degenerative changes seen within the spine. No acute bony abnormalities are identified. IMPRESSION: 1. The Foley catheter balloon is inflated within the penile urethra. Recommend repositioning. 2. Bilateral pleural effusions, mild pulmonary edema, and increased attenuation of the subcutaneous and intra-abdominal fat, all consistent with volume overload. 3. Abdominal aortic aneurysm with endovascular repair, unchanged. These results will be called to the ordering clinician or representative by the Radiologist Assistant, and communication documented in the PACS or zVision Dashboard. Electronically Signed   By: Dorise Bullion III M.D   On: 06/20/2016 15:15   Mr Brain Wo Contrast  Result Date: 06/21/2016 CLINICAL DATA:  Syncopal episode after bowel movement. Unresponsive. History of atrial fibrillation, hypertension, hypercholesterolemia. EXAM: MRI HEAD WITHOUT CONTRAST TECHNIQUE: Multiplanar, multiecho pulse sequences of the brain and surrounding structures were obtained without intravenous contrast. COMPARISON:  MRI head Feb 06, 2012 FINDINGS: Mildly motion degraded examination. BRAIN: No reduced diffusion to suggest acute ischemia. No susceptibility artifact to suggest hemorrhage. Moderate to severe stable ventriculomegaly on the basis of global parenchymal brain volume loss. No midline shift, mass effect or masses. 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 Patchy to confluent supratentorial white matter FLAIR T2 hyperintensities. Old RIGHT thalamus lacunar infarct. Prominent bilateral basal ganglia perivascular spaces are similar. No abnormal extra-axial fluid collections. Move VASCULAR:  Attenuated LEFT  vertebral artery and basilar artery. Dolicoectatic internal carotid arteries present previously. SKULL AND UPPER CERVICAL SPINE: No abnormal sellar expansion. Bright T1 bone marrow signal compatible with osteopenia. No suspicious calvarial bone marrow signal. Craniocervical junction maintained. SINUSES/ORBITS: The mastoid air-cells and included paranasal sinuses are well-aerated. Status post bilateral ocular lens implants. The included ocular globes and orbital contents are non-suspicious. OTHER: None. IMPRESSION: No acute intracranial process. Stable examination including attenuated LEFT vertebral artery and basilar artery which can be seen with slow flow, occlusion or hypoplasia. Involutional changes, moderate chronic small vessel ischemic disease and old RIGHT thalamus lacunar infarct. Electronically Signed   By: Elon Alas M.D.   On: 06/21/2016 01:10   US Renal  Result Date: 06/22/2016 CLINICAL DATA:  Renal insufficiency. EXAM: RENAL / URINARY TRACT ULTRASOUND COMPLETE COMPARISON:  CT scan 06/20/2016 FINDINGS: Right Kidney: Length: 11.2 cm. Diffuse increased echogenicity and renal cortical thinning with prominent renal sinus fat. No hydronephrosis, renal mass or renal calculi. There is a 4.1 x 4.1 x 3.7 cm cyst Left Kidney: Length: 10.3 cm. Diffuse increased echogenicity and renal cortical thinning with prominent renal sinus fat. No hydronephrosis, renal mass or renal calculus. Bladder: Decompressed by a Foley catheter. IMPRESSION: Diffuse increased echogenicity of both kidneys and renal cortical thinning could consistent with medical renal disease. No hydronephrosis. 4 cm upper pole right renal cyst. Electronically Signed   By: Marijo Sanes M.D.   On: 06/22/2016 12:15        Scheduled Meds: . atorvastatin  10 mg Oral q1800  .  ceFAZolin (ANCEF) IV  1 g Intravenous Q12H  . famotidine  20 mg Oral Daily  . fluticasone furoate-vilanterol  1 puff Inhalation Daily  . levothyroxine  50 mcg Oral  QAC breakfast  . mouth rinse  15 mL Mouth Rinse BID  . metoprolol tartrate  12.5 mg Oral BID  . polyethylene glycol  17 g Oral Daily  . senna-docusate  2 tablet Oral BID  . sodium bicarbonate  1,300 mg Oral TID  . tiotropium  18 mcg Inhalation Daily   Continuous Infusions:    LOS: 5 days    Time spent: 40 minutes    Dessa Phi, DO [facility] Pager [phone]  If 7PM-7AM, please contact night-coverage [email or web address] Password [email or web address] 06/22/2016, 1:20 PM

## 2016-06-22 NOTE — Care Management Important Message (Signed)
Important Message  Patient Details  Name: Ryan Hess MRN: [id number] Date of Birth: July 05, 1924   Medicare Important Message Given:  Yes    [name] 06/22/2016, 2:30 PM

## 2016-06-22 NOTE — Progress Notes (Signed)
Subjective: Interval History: has no complaint. Feels he is a little better. Son feels is more congested .  Objective: Vital signs in last 24 hours: Temp:  [97.6 F (36.4 C)-99.3 F (37.4 C)] 97.6 F (36.4 C) (09/28 0645) Pulse Rate:  [70-85] 70 (09/28 0645) Resp:  [20] 20 (09/28 0645) BP: (104-126)/(65-84) 126/84 (09/28 0645) SpO2:  [96 %-99 %] 97 % (09/28 0645) Weight:  [75.4 kg (166 lb 4.8 oz)] 75.4 kg (166 lb 4.8 oz) (09/28 0640) Weight change: -1.724 kg (-3 lb 12.8 oz)  Intake/Output from previous day: 09/27 0701 - 09/28 0700 In: 410 [P.O.:360] Out: 1470 [Urine:1470] Intake/Output this shift: No intake/output data recorded.  General appearance: cooperative, pale and confused Resp: rales bibasilar and rhonchi bibasilar Cardio: S1, S2 normal and systolic murmur: holosystolic 2/6, blowing at apex GI: pso bs, soft Extremities: edema 1-2+  Lab Results:  Recent Labs  06/21/16 0400 06/22/16 0509  WBC 13.2* 8.7  HGB 10.7* 9.9*  HCT 32.6* 29.8*  PLT 94* 80*   BMET:  Recent Labs  06/21/16 0400 06/22/16 0517  NA 139 140  K 5.7* 5.6*  CL 114* 116*  CO2 16* 15*  GLUCOSE 94 70  BUN 86* 80*  CREATININE 4.60* 4.70*  CALCIUM 8.5* 8.3*   No results for input(s): PTH in the last 72 hours. Iron Studies:  Recent Labs  06/21/16 1640  IRON 11*  TIBC 147*    Studies/Results: Ct Abdomen Pelvis Wo Contrast  Result Date: 06/20/2016 CLINICAL DATA:  Diffuse abdominal pain. EXAM: CT ABDOMEN AND PELVIS WITHOUT CONTRAST TECHNIQUE: Multidetector CT imaging of the abdomen and pelvis was performed following the standard protocol without IV contrast. COMPARISON:  CT scan June 17, 2016 FINDINGS: Lower chest: Bilateral pleural effusions and underlying atelectasis are identified. Emphysematous changes are seen in the bases, most prominent in the lingula. Suggested mild edema. Coronary artery calcifications. Hepatobiliary: A calcification is either in the liver or adjacent  gallbladder on series 2, image 28, unchanged. The liver and gallbladder are otherwise normal. Pancreas: Unremarkable. No pancreatic ductal dilatation or surrounding inflammatory changes. Spleen: Normal in size without focal abnormality. Adrenals/Urinary Tract: An anterior superior right renal cyst is stable. No evidence of renal obstruction or ureteral stones. The kidneys are otherwise stable. Stomach/Bowel: The stomach and small bowel are normal. The colon is unremarkable. The appendix is not seen but there is no secondary evidence of appendicitis. Vascular/Lymphatic: An abdominal aortic aneurysm is again identified with endovascular repair. The aneurysm measures up to 7 cm, unchanged. No evidence of leak. Stents are also seen in the celiac, superior mesenteric, and renal arteries. No adenopathy. Reproductive: No acute abnormalities in the prostate or seminal vesicles. Other: Diffuse mild increased attenuation in the fat of the abdomen and pelvis in addition to the subcutaneous tissues suggests volume overload. There is wall thickening associated with the superior anterior bladder, mildly worsened in the interval. There appears to be a catheter in the penile urethra with a rounded collection of fluid on series 2, image 107. This is suspicious for inflation of the balloon within the penile urethra. Musculoskeletal: Loss of height of L4 is stable. Degenerative changes seen within the spine. No acute bony abnormalities are identified. IMPRESSION: 1. The Foley catheter balloon is inflated within the penile urethra. Recommend repositioning. 2. Bilateral pleural effusions, mild pulmonary edema, and increased attenuation of the subcutaneous and intra-abdominal fat, all consistent with volume overload. 3. Abdominal aortic aneurysm with endovascular repair, unchanged. These results will be called to the  ordering clinician or representative by the Radiologist Assistant, and communication documented in the PACS or zVision  Dashboard. Electronically Signed   By: Dorise Bullion III M.D   On: 06/20/2016 15:15   Mr Brain Wo Contrast  Result Date: 06/21/2016 CLINICAL DATA:  Syncopal episode after bowel movement. Unresponsive. History of atrial fibrillation, hypertension, hypercholesterolemia. EXAM: MRI HEAD WITHOUT CONTRAST TECHNIQUE: Multiplanar, multiecho pulse sequences of the brain and surrounding structures were obtained without intravenous contrast. COMPARISON:  MRI head Feb 06, 2012 FINDINGS: Mildly motion degraded examination. BRAIN: No reduced diffusion to suggest acute ischemia. No susceptibility artifact to suggest hemorrhage. Moderate to severe stable ventriculomegaly on the basis of global parenchymal brain volume loss. No midline shift, mass effect or masses. 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 Patchy to confluent supratentorial white matter FLAIR T2 hyperintensities. Old RIGHT thalamus lacunar infarct. Prominent bilateral basal ganglia perivascular spaces are similar. No abnormal extra-axial fluid collections. Move VASCULAR: Attenuated LEFT vertebral artery and basilar artery. Dolicoectatic internal carotid arteries present previously. SKULL AND UPPER CERVICAL SPINE: No abnormal sellar expansion. Bright T1 bone marrow signal compatible with osteopenia. No suspicious calvarial bone marrow signal. Craniocervical junction maintained. SINUSES/ORBITS: The mastoid air-cells and included paranasal sinuses are well-aerated. Status post bilateral ocular lens implants. The included ocular globes and orbital contents are non-suspicious. OTHER: None. IMPRESSION: No acute intracranial process. Stable examination including attenuated LEFT vertebral artery and basilar artery which can be seen with slow flow, occlusion or hypoplasia. Involutional changes, moderate chronic small vessel ischemic disease and old RIGHT thalamus lacunar infarct. Electronically Signed   By: Elon Alas M.D.   On: 06/21/2016 01:10    I have reviewed  the patient's current medications.  Assessment/Plan: 1 AKI hemodynamic vs AIN with infx and obstruction.  U/S pending. Vol xs but O>I so hold off aggressive diuesis.  Acidemia on bicarb, due to low GFR. Cont po bicarb.  Not clear he will turn around 2 UTI on AB 3 Obstruction of his good kidney U/S 4 CHF stable 5 afib reg this am 6 dementia 7 COPD P po bicarb, U/S, follow vol closely    LOS: 5 days   Berlynn Warsame L 06/22/2016,8:26 AM

## 2016-06-23 LAB — RENAL FUNCTION PANEL
Albumin: 2.1 g/dL — ABNORMAL LOW (ref 3.5–5.0)
Anion gap: 10 (ref 5–15)
BUN: 76 mg/dL — ABNORMAL HIGH (ref 6–20)
CO2: 17 mmol/L — ABNORMAL LOW (ref 22–32)
Calcium: 7.9 mg/dL — ABNORMAL LOW (ref 8.9–10.3)
Chloride: 113 mmol/L — ABNORMAL HIGH (ref 101–111)
Creatinine, Ser: 4.73 mg/dL — ABNORMAL HIGH (ref 0.61–1.24)
GFR calc Af Amer: 11 mL/min — ABNORMAL LOW (ref >60)
GFR calc non Af Amer: 10 mL/min — ABNORMAL LOW (ref >60)
Glucose, Bld: 80 mg/dL (ref 65–99)
Phosphorus: 3.8 mg/dL (ref 2.5–4.6)
Potassium: 5.2 mmol/L — ABNORMAL HIGH (ref 3.5–5.1)
Sodium: 140 mmol/L (ref 135–145)

## 2016-06-23 LAB — CBC WITH DIFFERENTIAL/PLATELET
Basophils Absolute: 0 10*3/uL (ref 0.0–0.1)
Basophils Relative: 0 %
Eosinophils Absolute: 0.3 10*3/uL (ref 0.0–0.7)
Eosinophils Relative: 5 %
HCT: 26.5 % — ABNORMAL LOW (ref 39.0–52.0)
Hemoglobin: 8.7 g/dL — ABNORMAL LOW (ref 13.0–17.0)
Lymphocytes Relative: 15 %
Lymphs Abs: 1.1 10*3/uL (ref 0.7–4.0)
MCH: 31 pg (ref 26.0–34.0)
MCHC: 32.8 g/dL (ref 30.0–36.0)
MCV: 94.3 fL (ref 78.0–100.0)
Monocytes Absolute: 0.7 10*3/uL (ref 0.1–1.0)
Monocytes Relative: 11 %
Neutro Abs: 4.9 10*3/uL (ref 1.7–7.7)
Neutrophils Relative %: 69 %
Platelets: 85 10*3/uL — ABNORMAL LOW (ref 150–400)
RBC: 2.81 MIL/uL — ABNORMAL LOW (ref 4.22–5.81)
RDW: 16.1 % — ABNORMAL HIGH (ref 11.5–15.5)
WBC: 7 10*3/uL (ref 4.0–10.5)

## 2016-06-23 LAB — CULTURE, BLOOD (ROUTINE X 2)
Culture: NO GROWTH
Culture: NO GROWTH

## 2016-06-23 MED ORDER — NAPHAZOLINE-GLYCERIN 0.012-0.2 % OP SOLN: 1.0000 [drp] | Freq: Four times a day (QID) | OPHTHALMIC | Status: DC | PRN

## 2016-06-23 MED ORDER — NAPHAZOLINE-GLYCERIN 0.012-0.2 % OP SOLN
1.0000 [drp] | Freq: Four times a day (QID) | OPHTHALMIC | Status: DC | PRN
  Administered 2016-06-23 (×2): 2 [drp] via OPHTHALMIC
  Filled 2016-06-23: qty 15

## 2016-06-23 MED ORDER — NEOMYCIN-POLYMYXIN-DEXAMETH 3.5-10000-0.1 OP OINT
1.0000 "application " | TOPICAL_OINTMENT | OPHTHALMIC | Status: DC | PRN
  Administered 2016-06-23: 1 via OPHTHALMIC

## 2016-06-23 NOTE — Progress Notes (Signed)
Patient transferred from 3E15 to 3E04 due to ants. Biomedical scientist notified; [employer] called per Network engineer. Family at bedside and aware.

## 2016-06-23 NOTE — Progress Notes (Signed)
Physical Therapy Treatment Patient Details Name: Ryan Hess MRN: [id number] DOB: 12-03-1923 Today's Date: 06/23/2016    History of Present Illness Pt adm with GI bleed. PMH - afib, copd, htn, chf    PT Comments    Pt admitted with above diagnosis. Pt currently with functional limitations due to balance and endurance deficits. Pt was able to ambulate with RW with VSS today.  Some cues needed. Will have son at home to assist.   Pt will benefit from skilled PT to increase their independence and safety with mobility to allow discharge to the venue listed below.    Follow Up Recommendations  Home health PT;Supervision for mobility/OOB     Equipment Recommendations  None recommended by PT    Recommendations for Other Services       Precautions / Restrictions Precautions Precautions: Fall Restrictions Weight Bearing Restrictions: No    Mobility  Bed Mobility Overal bed mobility: Needs Assistance Bed Mobility: Supine to Sit     Supine to sit: Min guard;Min assist     General bed mobility comments: help to finish scooting to EOB  Transfers Overall transfer level: Needs assistance Equipment used: Rolling walker (2 wheeled);1 person hand held assist Transfers: Sit to/from Stand Sit to Stand: Min assist         General transfer comment: Needed cues for hand placement only.    Ambulation/Gait Ambulation/Gait assistance: Min guard;Min assist;+2 safety/equipment Ambulation Distance (Feet): 220 Feet (70 feet, seated rest break and then 150 feet) Assistive device: Rolling walker (2 wheeled) Gait Pattern/deviations: Decreased stride length;Step-through pattern;Trunk flexed;Wide base of support;Shuffle   Gait velocity interpretation: Below normal speed for age/gender General Gait Details: redirection of sequence and safety of the task as pt does keep trunk flexed and pt needs cues to stay close to RW.  Pt ambulates impulsively at times as well.  Followed with chair but pt  better today and was able to make it back toroom.    Stairs            Wheelchair Mobility    Modified Rankin (Stroke Patients Only)       Balance     Sitting balance-Leahy Scale: Good     Standing balance support: Bilateral upper extremity supported;During functional activity Standing balance-Leahy Scale: Poor Standing balance comment: Pt unsteady on his feet at times needing min assist.                      Cognition Arousal/Alertness: Awake/alert Behavior During Therapy: Impulsive Overall Cognitive Status: Impaired/Different from baseline Area of Impairment: Attention;Following commands;Safety/judgement;Awareness   Current Attention Level: Sustained Memory: Decreased recall of precautions;Decreased short-term memory Following Commands: Follows one step commands consistently Safety/Judgement: Decreased awareness of safety Awareness: Intellectual Problem Solving: Requires verbal cues;Requires tactile cues      Exercises      General Comments        Pertinent Vitals/Pain Pain Assessment: No/denies pain  Hr 94-125 bpm.  Most of walk HR 108-118 bpm.      Home Living                      Prior Function            PT Goals (current goals can now be found in the care plan section) Acute Rehab PT Goals Patient Stated Goal: return home with son Progress towards PT goals: Progressing toward goals    Frequency    Min 3X/week  PT Plan Current plan remains appropriate    Co-evaluation             End of Session Equipment Utilized During Treatment: Gait belt Activity Tolerance: Patient tolerated treatment well Patient left: in chair;with call bell/phone within reach;with chair alarm set;with family/visitor present     Time: [id number] PT Time Calculation (min) (ACUTE ONLY): 10 min  Charges:  $Gait Training: 8-22 mins                    G CodesDenice Paradise 04-Jul-2016, 1:26 PM Raed Schalk,PT Acute  Rehabilitation [phone] (pager)

## 2016-06-23 NOTE — Progress Notes (Signed)
PROGRESS NOTE    WAKE ACRE  [id number] DOB: 08/11/1924 DOA: 06/17/2016 PCP: Noralee Space, MD    Brief Narrative:  KWABENA ACHEE is a 82 y.o. male with medical history significant of complex TAAA s/p EVAR at [facility], CKD stage 3, A.Fib on coumadin, HTN.  Patient presents to the ED with 3 weeks of intermittent melena that became consistent melena for the past 3 days or so.  No h/o N/V or abdominal pain.  No fever, no cough, no congestion.  Patient's family report that he is so weak he cannot walk which is why they brought him in.  Weakness is generalized without specific neurologic symptoms. He presented with hypotension, SBP in the 60s, AKI, UTI, melana, anemia, and supratherapeutic INR. He received vitamin K, pRBC transfusion, PPI. GI has seen patient and has signed off.   On 9/26, he developed an unresponsive episode while having Bm, possibly due to vagal response. MRI brain was ordered, which was negative for acute changes. Urology was consulted for mild hydro and recommended keep foley at time of discharge and outpatient follow up with urology.    Assessment & Plan:   Principal Problem:   GI bleed Active Problems:   Essential hypertension   Atrial fibrillation (HCC)   COPD (chronic obstructive pulmonary disease) with chronic bronchitis (HCC)   Acute blood loss anemia   Chronic systolic congestive heart failure (HCC)   Acute kidney failure (HCC)   Melena   UTI (lower urinary tract infection)   Metabolic acidosis, NAG, bicarbonate losses   CKD (chronic kidney disease) stage 3, GFR 30-59 ml/min   Pressure injury of skin   Melena with acute blood loss anemia -S/p prbc transfusion x2 units -GI signed off  -Spoke with daughter and son regarding restarting Coumadin. We need to weigh the benefits and risks of restarting blood thinner since patient has had melanotic stools versus benefit offered for stroke prevention. Would favor holding coumadin in setting of decreasing Hgb  without overt bleed   AKI on CKD III -Cr on admission is 5.39, baseline around 2 -AKI likely due to uti/dehydration/gi bleed/obstruction  -Renal US on 9/24 new mild left sided hydronephrosis, new since ct ab on 9/23, thick walled bladder with debris, foley placed, urology consulted, per urology will keep foley in at discharge and outpatient follow up with urology. -Patient/family should call [facility] on discharge to schedule follow up appointment with Dr. Jeffie Pollock or partner.  -Appreciate Nephrology consult   Klebsiella UTI, recurrent, unknown if present on admission -Ancef   Hyperkalemia -Iatrogenic -Stop supplementation  Metabolic acidosis  -Was on bicarb drip, changed to oral supplement -Appreciate Nephrology consult   Chronic afib -Rate controlled, betablocker held initially due to hypotension -Lopressor restarted  -On coumadin at home which is held since admission due to gi bleed  Chronic systolic chf -Last EF in [id number] 45-50%  COPD -Stable  -Continue home meds  Dementia -Not oriented to time or place,very supportive family in room 24/7 for assistance  Thrombocytopenia -Monitor trend. No exposure to heparin product, no physical signs to suggest DVT.  Bilateral pleural effusion -IVF discontinued  HLD -Lipitor   Hypothyroidism -Synthroid   AAA s/p endoscopic repair -7cm , unchanged   DVT prophylaxis: SCDs Code Status: DNR Family Communication: son at bedside Disposition Plan: Continue to monitor kidney function. Patient without any improvement today.   Consultants:   [facility]  Urology   Nephrology   Procedures:   pRBC transfusion x2units on 9/23-24night  Antimicrobials:   Rocephin from 9/23 to 9/26  Cefazolin from 9/26 >>>    Subjective: Patient remains pleasantly confused in bed. No acute events overnight. No complaints.   Objective: Vitals:   06/22/16 1900 06/22/16 2158 06/23/16 0736 06/23/16 0854  BP: (!)  95/45 (!) 118/59 116/61   Pulse: 68 (!) 109 89   Resp: 19  20   Temp: 98.6 F (37 C)  98 F (36.7 C)   TempSrc: Oral  Oral   SpO2: 95%  94% 97%  Weight:   76.1 kg (167 lb 12.8 oz)   Height:        Intake/Output Summary (Last 24 hours) at 06/23/16 1030 Last data filed at 06/23/16 0738  Gross per 24 hour  Intake              400 ml  Output              875 ml  Net             -475 ml   Filed Weights   06/21/16 0655 06/22/16 0640 06/23/16 0736  Weight: 77.2 kg (170 lb 1.6 oz) 75.4 kg (166 lb 4.8 oz) 76.1 kg (167 lb 12.8 oz)    Examination:  General exam: Appears calm and comfortable, Confused Respiratory system: Clear to auscultation. Respiratory effort normal. Cardiovascular system: S1 & S2 heard, RRR. No JVD, murmurs, rubs, gallops or clicks. Gastrointestinal system: Abdomen is nondistended, soft and nontender. No organomegaly or masses felt. Normal bowel sounds heard. Central nervous system: Alert and not oriented Extremities: Moves all extremities spontaneously  Data Reviewed: I have personally reviewed following labs and imaging studies  CBC:  Recent Labs Lab 06/17/16 1730  06/19/16 0245 06/20/16 0241 06/21/16 0400 06/21/16 1640 06/22/16 0509 06/23/16 0449  WBC 9.5  < > 8.6 10.8* 13.2*  --  8.7 7.0  NEUTROABS 7.5  --   --   --   --  9.4* 6.8 4.9  HGB 8.1*  < > 9.4* 9.6* 10.7*  --  9.9* 8.7*  HCT 23.6*  < > 27.4* 28.7* 32.6*  --  29.8* 26.5*  MCV 92.5  < > 90.1 91.4 93.9  --  95.5 94.3  PLT 134*  < > 95* 87* 94*  --  80* 85*  < > = values in this interval not displayed. Basic Metabolic Panel:  Recent Labs Lab 06/19/16 0245 06/20/16 0241 06/21/16 0400 06/22/16 0517 06/23/16 0449  NA 141 140 139 140 140  K 2.8* 4.3 5.7* 5.6* 5.2*  CL 115* 116* 114* 116* 113*  CO2 17* 16* 16* 15* 17*  GLUCOSE 94 99 94 70 80  BUN 111* 94* 86* 80* 76*  CREATININE 4.63* 4.49* 4.60* 4.70* 4.73*  CALCIUM 7.9* 8.2* 8.5* 8.3* 7.9*  PHOS  --   --   --  3.3 3.8    GFR: Estimated Creatinine Clearance: 10 mL/min (by C-G formula based on SCr of 4.73 mg/dL (H)). Liver Function Tests:  Recent Labs Lab 06/17/16 1730 06/22/16 0517 06/23/16 0449  AST 20  --   --   ALT 17  --   --   ALKPHOS 93  --   --   BILITOT 1.1  --   --   PROT 4.9*  --   --   ALBUMIN 2.5* 2.4* 2.1*   No results for input(s): LIPASE, AMYLASE in the last 168 hours. No results for input(s): AMMONIA in the last 168 hours. Coagulation Profile:  Recent Labs  Lab 06/18/16 0152 06/19/16 0906 06/20/16 0241 06/21/16 0400 06/22/16 0509  INR 2.04 1.38 1.39 1.41 1.48   Cardiac Enzymes:  Recent Labs Lab 06/17/16 1730  TROPONINI 0.04*   BNP (last 3 results)  Recent Labs  12/14/15 1219  PROBNP 462.0*   HbA1C: No results for input(s): HGBA1C in the last 72 hours. CBG:  Recent Labs Lab 06/20/16 0523  GLUCAP 86   Lipid Profile: No results for input(s): CHOL, HDL, LDLCALC, TRIG, CHOLHDL, LDLDIRECT in the last 72 hours. Thyroid Function Tests: No results for input(s): TSH, T4TOTAL, FREET4, T3FREE, THYROIDAB in the last 72 hours. Anemia Panel:  Recent Labs  06/21/16 1640  TIBC 147*  IRON 11*   Sepsis Labs:  Recent Labs Lab 06/17/16 1744 06/20/16 0241  LATICACIDVEN 1.62 1.3    Recent Results (from the past 240 hour(s))  Urine culture     Status: Abnormal   Collection Time: 06/17/16  5:00 PM  Result Value Ref Range Status   Specimen Description URINE, RANDOM  Final   Special Requests NONE  Final   Culture 50,000 COLONIES/mL KLEBSIELLA PNEUMONIAE (A)  Final   Report Status 06/19/2016 FINAL  Final   Organism ID, Bacteria KLEBSIELLA PNEUMONIAE (A)  Final      Susceptibility   Klebsiella pneumoniae - MIC*    AMPICILLIN >=32 RESISTANT Resistant     CEFAZOLIN <=4 SENSITIVE Sensitive     CEFTRIAXONE <=1 SENSITIVE Sensitive     CIPROFLOXACIN <=0.25 SENSITIVE Sensitive     GENTAMICIN <=1 SENSITIVE Sensitive     IMIPENEM <=0.25 SENSITIVE Sensitive      NITROFURANTOIN 32 SENSITIVE Sensitive     TRIMETH/SULFA <=20 SENSITIVE Sensitive     AMPICILLIN/SULBACTAM 4 SENSITIVE Sensitive     PIP/TAZO <=4 SENSITIVE Sensitive     Extended ESBL NEGATIVE Sensitive     * 50,000 COLONIES/mL KLEBSIELLA PNEUMONIAE  MRSA PCR Screening     Status: None   Collection Time: 06/18/16  1:44 AM  Result Value Ref Range Status   MRSA by PCR NEGATIVE NEGATIVE Final    Comment:        The GeneXpert MRSA Assay (FDA approved for NASAL specimens only), is one component of a comprehensive MRSA colonization surveillance program. It is not intended to diagnose MRSA infection nor to guide or monitor treatment for MRSA infections.   Urine culture     Status: Abnormal   Collection Time: 06/18/16 10:45 AM  Result Value Ref Range Status   Specimen Description URINE, CLEAN CATCH  Final   Special Requests NONE  Final   Culture >=100,000 COLONIES/mL KLEBSIELLA PNEUMONIAE (A)  Final   Report Status 06/21/2016 FINAL  Final   Organism ID, Bacteria KLEBSIELLA PNEUMONIAE (A)  Final      Susceptibility   Klebsiella pneumoniae - MIC*    AMPICILLIN >=32 RESISTANT Resistant     CEFAZOLIN <=4 SENSITIVE Sensitive     CEFTRIAXONE <=1 SENSITIVE Sensitive     CIPROFLOXACIN <=0.25 SENSITIVE Sensitive     GENTAMICIN <=1 SENSITIVE Sensitive     IMIPENEM <=0.25 SENSITIVE Sensitive     NITROFURANTOIN 32 SENSITIVE Sensitive     TRIMETH/SULFA <=20 SENSITIVE Sensitive     AMPICILLIN/SULBACTAM 4 SENSITIVE Sensitive     PIP/TAZO <=4 SENSITIVE Sensitive     Extended ESBL NEGATIVE Sensitive     * >=100,000 COLONIES/mL KLEBSIELLA PNEUMONIAE  Culture, blood (routine x 2)     Status: None (Preliminary result)   Collection Time: 06/18/16  6:29 PM  Result Value  Ref Range Status   Specimen Description BLOOD LEFT ANTECUBITAL  Final   Special Requests BOTTLES DRAWN AEROBIC AND ANAEROBIC 5CC  Final   Culture NO GROWTH 4 DAYS  Final   Report Status PENDING  Incomplete  Culture, blood (routine  x 2)     Status: None (Preliminary result)   Collection Time: 06/18/16  6:36 PM  Result Value Ref Range Status   Specimen Description BLOOD LEFT ARM  Final   Special Requests BOTTLES DRAWN AEROBIC AND ANAEROBIC 5CC  Final   Culture NO GROWTH 4 DAYS  Final   Report Status PENDING  Incomplete         Radiology Studies: US Renal  Result Date: 06/22/2016 CLINICAL DATA:  Renal insufficiency. EXAM: RENAL / URINARY TRACT ULTRASOUND COMPLETE COMPARISON:  CT scan 06/20/2016 FINDINGS: Right Kidney: Length: 11.2 cm. Diffuse increased echogenicity and renal cortical thinning with prominent renal sinus fat. No hydronephrosis, renal mass or renal calculi. There is a 4.1 x 4.1 x 3.7 cm cyst Left Kidney: Length: 10.3 cm. Diffuse increased echogenicity and renal cortical thinning with prominent renal sinus fat. No hydronephrosis, renal mass or renal calculus. Bladder: Decompressed by a Foley catheter. IMPRESSION: Diffuse increased echogenicity of both kidneys and renal cortical thinning could consistent with medical renal disease. No hydronephrosis. 4 cm upper pole right renal cyst. Electronically Signed   By: Marijo Sanes M.D.   On: 06/22/2016 12:15        Scheduled Meds: . atorvastatin  10 mg Oral q1800  .  ceFAZolin (ANCEF) IV  1 g Intravenous Q12H  . famotidine  20 mg Oral Daily  . fluticasone furoate-vilanterol  1 puff Inhalation Daily  . levothyroxine  50 mcg Oral QAC breakfast  . mouth rinse  15 mL Mouth Rinse BID  . metoprolol tartrate  12.5 mg Oral BID  . polyethylene glycol  17 g Oral Daily  . senna-docusate  2 tablet Oral BID  . sodium bicarbonate  1,300 mg Oral TID  . tiotropium  18 mcg Inhalation Daily   Continuous Infusions:    LOS: 6 days    Time spent: 30 minutes    Dessa Phi, DO [facility] Pager [phone]  If 7PM-7AM, please contact night-coverage [email or web address] Password [email or web address] 06/23/2016, 10:30 AM

## 2016-06-23 NOTE — Progress Notes (Signed)
Subjective: Interval History: has no complaint,not interactive this am.  Objective: Vital signs in last 24 hours: Temp:  [98 F (36.7 C)-98.6 F (37 C)] 98 F (36.7 C) (09/29 0736) Pulse Rate:  [68-109] 89 (09/29 0736) Resp:  [18-20] 20 (09/29 0736) BP: (95-122)/(45-61) 116/61 (09/29 0736) SpO2:  [94 %-97 %] 94 % (09/29 0736) Weight:  [76.1 kg (167 lb 12.8 oz)] 76.1 kg (167 lb 12.8 oz) (09/29 0736) Weight change:   Intake/Output from previous day: 09/28 0701 - 09/29 0700 In: 640 [P.O.:640] Out: 1075 [Urine:1075] Intake/Output this shift: Total I/O In: -  Out: 225 [Urine:225]  General appearance: cooperative, pale and slowed mentation Resp: diminished breath sounds bilaterally and rales bibasilar Cardio: S1, S2 normal and systolic murmur: holosystolic 2/6, blowing at apex GI: soft, liver down 4 cm, pos bss Extremities: edema 1+  Lab Results:  Recent Labs  06/22/16 0509 06/23/16 0449  WBC 8.7 7.0  HGB 9.9* 8.7*  HCT 29.8* 26.5*  PLT 80* 85*   BMET:  Recent Labs  06/22/16 0517 06/23/16 0449  NA 140 140  K 5.6* 5.2*  CL 116* 113*  CO2 15* 17*  GLUCOSE 70 80  BUN 80* 76*  CREATININE 4.70* 4.73*  CALCIUM 8.3* 7.9*    Recent Labs  06/21/16 1640  PTH 77*   Iron Studies:  Recent Labs  06/21/16 1640  IRON 11*  TIBC 147*    Studies/Results: US Renal  Result Date: 06/22/2016 CLINICAL DATA:  Renal insufficiency. EXAM: RENAL / URINARY TRACT ULTRASOUND COMPLETE COMPARISON:  CT scan 06/20/2016 FINDINGS: Right Kidney: Length: 11.2 cm. Diffuse increased echogenicity and renal cortical thinning with prominent renal sinus fat. No hydronephrosis, renal mass or renal calculi. There is a 4.1 x 4.1 x 3.7 cm cyst Left Kidney: Length: 10.3 cm. Diffuse increased echogenicity and renal cortical thinning with prominent renal sinus fat. No hydronephrosis, renal mass or renal calculus. Bladder: Decompressed by a Foley catheter. IMPRESSION: Diffuse increased echogenicity of  both kidneys and renal cortical thinning could consistent with medical renal disease. No hydronephrosis. 4 cm upper pole right renal cyst. Electronically Signed   By: Marijo Sanes M.D.   On: 06/22/2016 12:15    I have reviewed the patient's current medications.  Assessment/Plan: 1 AKI vol xs, Cr has plateaued but at very low level function.  Hemodynamic injury.  Acid base stable to better. 2 GIB stable 3 Anemia will give Fe 4 Cardiac dz   5 Dementia 6 Obstructive Uropathy  Hydro better 7 COPD P follow Cr, allow to spont diurese, cont bicarb.      LOS: 6 days   Kiaira Pointer L 06/23/2016,7:39 AM

## 2016-06-24 LAB — RENAL FUNCTION PANEL
Albumin: 2 g/dL — ABNORMAL LOW (ref 3.5–5.0)
Anion gap: 7 (ref 5–15)
BUN: 72 mg/dL — ABNORMAL HIGH (ref 6–20)
CO2: 20 mmol/L — ABNORMAL LOW (ref 22–32)
Calcium: 7.6 mg/dL — ABNORMAL LOW (ref 8.9–10.3)
Chloride: 115 mmol/L — ABNORMAL HIGH (ref 101–111)
Creatinine, Ser: 4.58 mg/dL — ABNORMAL HIGH (ref 0.61–1.24)
GFR calc Af Amer: 12 mL/min — ABNORMAL LOW (ref >60)
GFR calc non Af Amer: 10 mL/min — ABNORMAL LOW (ref >60)
Glucose, Bld: 114 mg/dL — ABNORMAL HIGH (ref 65–99)
Phosphorus: 3.8 mg/dL (ref 2.5–4.6)
Potassium: 4 mmol/L (ref 3.5–5.1)
Sodium: 142 mmol/L (ref 135–145)

## 2016-06-24 LAB — CBC WITH DIFFERENTIAL/PLATELET
Basophils Absolute: 0 10*3/uL (ref 0.0–0.1)
Basophils Relative: 0 %
Eosinophils Absolute: 0.3 10*3/uL (ref 0.0–0.7)
Eosinophils Relative: 4 %
HCT: 26.8 % — ABNORMAL LOW (ref 39.0–52.0)
Hemoglobin: 8.7 g/dL — ABNORMAL LOW (ref 13.0–17.0)
Lymphocytes Relative: 12 %
Lymphs Abs: 0.8 10*3/uL (ref 0.7–4.0)
MCH: 31.1 pg (ref 26.0–34.0)
MCHC: 32.5 g/dL (ref 30.0–36.0)
MCV: 95.7 fL (ref 78.0–100.0)
Monocytes Absolute: 1 10*3/uL (ref 0.1–1.0)
Monocytes Relative: 15 %
Neutro Abs: 4.3 10*3/uL (ref 1.7–7.7)
Neutrophils Relative %: 68 %
Platelets: 73 10*3/uL — ABNORMAL LOW (ref 150–400)
RBC: 2.8 MIL/uL — ABNORMAL LOW (ref 4.22–5.81)
RDW: 16 % — ABNORMAL HIGH (ref 11.5–15.5)
WBC: 6.3 10*3/uL (ref 4.0–10.5)

## 2016-06-24 MED ORDER — DOCUSATE SODIUM 100 MG PO CAPS
100.0000 mg | ORAL_CAPSULE | Freq: Two times a day (BID) | ORAL | Status: DC
  Administered 2016-06-24 – 2016-06-26 (×2): 100 mg via ORAL
  Filled 2016-06-24 (×5): qty 1

## 2016-06-24 MED ORDER — GLYCERIN (LAXATIVE) 2.1 G RE SUPP
1.0000 | Freq: Every day | RECTAL | Status: DC | PRN
  Filled 2016-06-24 (×2): qty 1

## 2016-06-24 NOTE — Progress Notes (Signed)
Subjective: Interval History: has no complaint, no pain.  Objective: Vital signs in last 24 hours: Temp:  [97.4 F (36.3 C)-97.8 F (36.6 C)] 97.4 F (36.3 C) (09/30 [id number]) Pulse Rate:  [71-111] 111 (09/30 0917) Resp:  [18] 18 (09/30 0642) BP: (106-124)/(59-75) 112/59 (09/30 0917) SpO2:  [93 %-98 %] 98 % (09/30 0642) Weight:  [77.4 kg (170 lb 11.2 oz)] 77.4 kg (170 lb 11.2 oz) (09/30 [id number]) Weight change:   Intake/Output from previous day: 09/29 0701 - 09/30 0700 In: 840 [P.O.:840] Out: 1645 [Urine:1645] Intake/Output this shift: No intake/output data recorded.  General appearance: alert, cooperative and pale Resp: alert, cooperative and confused, pale Cardio: irregularly irregular rhythm and systolic murmur: holosystolic 2/6, blowing at apex GI: soft, non-tender; bowel sounds normal; no masses,  no organomegaly Extremities: edema 1-2+  Resp crackles in bases , decreased bs Lab Results:  Recent Labs  06/23/16 0449 06/24/16 0259  WBC 7.0 6.3  HGB 8.7* 8.7*  HCT 26.5* 26.8*  PLT 85* 73*   BMET:  Recent Labs  06/23/16 0449 06/24/16 0259  NA 140 142  K 5.2* 4.0  CL 113* 115*  CO2 17* 20*  GLUCOSE 80 114*  BUN 76* 72*  CREATININE 4.73* 4.58*  CALCIUM 7.9* 7.6*    Recent Labs  06/21/16 1640  PTH 77*   Iron Studies:  Recent Labs  06/21/16 1640  IRON 11*  TIBC 147*    Studies/Results: US Renal  Result Date: 06/22/2016 CLINICAL DATA:  Renal insufficiency. EXAM: RENAL / URINARY TRACT ULTRASOUND COMPLETE COMPARISON:  CT scan 06/20/2016 FINDINGS: Right Kidney: Length: 11.2 cm. Diffuse increased echogenicity and renal cortical thinning with prominent renal sinus fat. No hydronephrosis, renal mass or renal calculi. There is a 4.1 x 4.1 x 3.7 cm cyst Left Kidney: Length: 10.3 cm. Diffuse increased echogenicity and renal cortical thinning with prominent renal sinus fat. No hydronephrosis, renal mass or renal calculus. Bladder: Decompressed by a Foley catheter.  IMPRESSION: Diffuse increased echogenicity of both kidneys and renal cortical thinning could consistent with medical renal disease. No hydronephrosis. 4 cm upper pole right renal cyst. Electronically Signed   By: Marijo Sanes M.D.   On: 06/22/2016 12:15    I have reviewed the patient's current medications.  Assessment/Plan: 1 AKI/CKD4  Vol a little better Cr may be a little better, will cont conservative tx.  Eito Losartan and NSAIDS in setting of low bps 2 BOO foley 3 anemia given Fe 4 Dementia 5 afib agree with hold off coumadin 6 Prostate Ca 7 HTn not an issue 8 CHF stable P allow to diurese, cont foley, tx conservatively   LOS: 7 days   Jacqulyn Barresi L 06/24/2016,10:19 AM

## 2016-06-24 NOTE — Progress Notes (Signed)
PROGRESS NOTE    Ryan Hess  [id number] DOB: 21-Nov-1923 DOA: 06/17/2016 PCP: Noralee Space, MD    Brief Narrative:  Ryan Hess is a 82 y.o. male with medical history significant of complex TAAA s/p EVAR at [facility], CKD stage 3, A.Fib on coumadin, HTN.  Patient presents to the ED with 3 weeks of intermittent melena that became consistent melena for the past 3 days or so.  No h/o N/V or abdominal pain.  No fever, no cough, no congestion.  Patient's family report that he is so weak he cannot walk which is why they brought him in.  Weakness is generalized without specific neurologic symptoms. He presented with hypotension, SBP in the 60s, AKI, UTI, melana, anemia, and supratherapeutic INR. He received vitamin K, pRBC transfusion, PPI. GI has seen patient and has signed off.   On 9/26, he developed an unresponsive episode while having Bm, possibly due to vagal response. MRI brain was ordered, which was negative for acute changes. Urology was consulted for mild hydro and recommended keep foley at time of discharge and outpatient follow up with urology.    Assessment & Plan:   Principal Problem:   GI bleed Active Problems:   Essential hypertension   Atrial fibrillation (HCC)   COPD (chronic obstructive pulmonary disease) with chronic bronchitis (HCC)   Acute blood loss anemia   Chronic systolic congestive heart failure (HCC)   Acute kidney failure (HCC)   Melena   UTI (lower urinary tract infection)   Metabolic acidosis, NAG, bicarbonate losses   CKD (chronic kidney disease) stage 3, GFR 30-59 ml/min   Pressure injury of skin   Melena with acute blood loss anemia -S/p prbc transfusion x2 units -GI signed off  -Spoke with daughter and son regarding restarting Coumadin. We need to weigh the benefits and risks of restarting blood thinner since patient has had melanotic stools versus benefit offered for stroke prevention. Would favor holding coumadin in setting of decreasing Hgb  without overt bleed  -Monitor CBC   AKI on CKD III -Cr on admission is 5.39, baseline around 2 -AKI likely due to uti/dehydration/gi bleed/obstruction  -Renal US on 9/24 new mild left sided hydronephrosis, new since ct ab on 9/23, thick walled bladder with debris, foley placed, urology consulted, per urology will keep foley in at discharge and outpatient follow up with urology. -Patient/family should call [facility] on discharge to schedule follow up appointment with Dr. Jeffie Pollock or partner.  -Monitor BMP  -Appreciate Nephrology   Klebsiella UTI, recurrent, unknown if present on admission -Ancef total 10 days   Hyperkalemia -Resolved   Metabolic acidosis  -Was on bicarb drip, changed to oral supplement -Appreciate Nephrology consult   Chronic afib -Rate controlled, betablocker held initially due to hypotension -Lopressor restarted  -On coumadin at home which is held since admission due to gi bleed  Chronic systolic chf -Last EF in 123456 45-50%  COPD -Stable  -Continue home meds  Dementia -Not oriented to time or place,very supportive family in room 24/7 for assistance  Thrombocytopenia -Monitor trend. No exposure to heparin product, no physical signs to suggest DVT.  Bilateral pleural effusion -IVF discontinued  HLD -Lipitor   Hypothyroidism -Synthroid   AAA s/p endoscopic repair -7cm , unchanged   DVT prophylaxis: SCDs Code Status: DNR Family Communication: son, daughter at bedside Disposition Plan: Continue to monitor kidney function. Patient with some improvement today.    Consultants:   [facility]   Nephrology  Procedures:   pRBC transfusion x2units on 9/23-24night  Antimicrobials:   Rocephin from 9/23 to 9/26  Cefazolin from 9/26 >>> 10/5 (total 10 days)    Subjective: Patient doing well this morning. He is much more alert and interactive than he had been in the past few days. He worked with PT and walked  down to the main desk and back. He has no specific complaints and states that he is eating well. No chest pain or shortness of breath.  Objective: Vitals:   06/23/16 1958 06/24/16 0642 06/24/16 0917 06/24/16 1230  BP: 122/74 124/75 (!) 112/59 (!) 137/43  Pulse: 71 91 (!) 111 81  Resp: 18 18  20   Temp: 97.8 F (36.6 C) 97.4 F (36.3 C)  97.3 F (36.3 C)  TempSrc: Oral Oral  Oral  SpO2: 94% 98%  92%  Weight:  77.4 kg (170 lb 11.2 oz)    Height:        Intake/Output Summary (Last 24 hours) at 06/24/16 1344 Last data filed at 06/24/16 0900  Gross per 24 hour  Intake              840 ml  Output             1420 ml  Net             -580 ml   Filed Weights   06/22/16 0640 06/23/16 0736 06/24/16 0642  Weight: 75.4 kg (166 lb 4.8 oz) 76.1 kg (167 lb 12.8 oz) 77.4 kg (170 lb 11.2 oz)    Examination:  General exam: Appears calm and comfortable Respiratory system: Clear to auscultation. Respiratory effort normal. Cardiovascular system: S1 & S2 heard, RRR. No JVD, murmurs, rubs, gallops or clicks. +1 pedal edema Gastrointestinal system: Abdomen is nondistended, soft and nontender. No organomegaly or masses felt. Normal bowel sounds heard. Central nervous system: Alert Extremities: Moves all extremities spontaneously  Data Reviewed: I have personally reviewed following labs and imaging studies  CBC:  Recent Labs Lab 06/17/16 1730  06/20/16 0241 06/21/16 0400 06/21/16 1640 06/22/16 0509 06/23/16 0449 06/24/16 0259  WBC 9.5  < > 10.8* 13.2*  --  8.7 7.0 6.3  NEUTROABS 7.5  --   --   --  9.4* 6.8 4.9 4.3  HGB 8.1*  < > 9.6* 10.7*  --  9.9* 8.7* 8.7*  HCT 23.6*  < > 28.7* 32.6*  --  29.8* 26.5* 26.8*  MCV 92.5  < > 91.4 93.9  --  95.5 94.3 95.7  PLT 134*  < > 87* 94*  --  80* 85* 73*  < > = values in this interval not displayed. Basic Metabolic Panel:  Recent Labs Lab 06/20/16 0241 06/21/16 0400 06/22/16 0517 06/23/16 0449 06/24/16 0259  NA 140 139 140 140 142  K 4.3  5.7* 5.6* 5.2* 4.0  CL 116* 114* 116* 113* 115*  CO2 16* 16* 15* 17* 20*  GLUCOSE 99 94 70 80 114*  BUN 94* 86* 80* 76* 72*  CREATININE 4.49* 4.60* 4.70* 4.73* 4.58*  CALCIUM 8.2* 8.5* 8.3* 7.9* 7.6*  PHOS  --   --  3.3 3.8 3.8   GFR: Estimated Creatinine Clearance: 10.4 mL/min (by C-G formula based on SCr of 4.58 mg/dL (H)). Liver Function Tests:  Recent Labs Lab 06/17/16 1730 06/22/16 0517 06/23/16 0449 06/24/16 0259  AST 20  --   --   --   ALT 17  --   --   --   Arabella Merles  93  --   --   --   BILITOT 1.1  --   --   --   PROT 4.9*  --   --   --   ALBUMIN 2.5* 2.4* 2.1* 2.0*   No results for input(s): LIPASE, AMYLASE in the last 168 hours. No results for input(s): AMMONIA in the last 168 hours. Coagulation Profile:  Recent Labs Lab 06/18/16 0152 06/19/16 0906 06/20/16 0241 06/21/16 0400 06/22/16 0509  INR 2.04 1.38 1.39 1.41 1.48   Cardiac Enzymes:  Recent Labs Lab 06/17/16 1730  TROPONINI 0.04*   BNP (last 3 results)  Recent Labs  12/14/15 1219  PROBNP 462.0*   HbA1C: No results for input(s): HGBA1C in the last 72 hours. CBG:  Recent Labs Lab 06/20/16 0523  GLUCAP 86   Lipid Profile: No results for input(s): CHOL, HDL, LDLCALC, TRIG, CHOLHDL, LDLDIRECT in the last 72 hours. Thyroid Function Tests: No results for input(s): TSH, T4TOTAL, FREET4, T3FREE, THYROIDAB in the last 72 hours. Anemia Panel:  Recent Labs  06/21/16 1640  TIBC 147*  IRON 11*   Sepsis Labs:  Recent Labs Lab 06/17/16 1744 06/20/16 0241  LATICACIDVEN 1.62 1.3    Recent Results (from the past 240 hour(s))  Urine culture     Status: Abnormal   Collection Time: 06/17/16  5:00 PM  Result Value Ref Range Status   Specimen Description URINE, RANDOM  Final   Special Requests NONE  Final   Culture 50,000 COLONIES/mL KLEBSIELLA PNEUMONIAE (A)  Final   Report Status 06/19/2016 FINAL  Final   Organism ID, Bacteria KLEBSIELLA PNEUMONIAE (A)  Final      Susceptibility    Klebsiella pneumoniae - MIC*    AMPICILLIN >=32 RESISTANT Resistant     CEFAZOLIN <=4 SENSITIVE Sensitive     CEFTRIAXONE <=1 SENSITIVE Sensitive     CIPROFLOXACIN <=0.25 SENSITIVE Sensitive     GENTAMICIN <=1 SENSITIVE Sensitive     IMIPENEM <=0.25 SENSITIVE Sensitive     NITROFURANTOIN 32 SENSITIVE Sensitive     TRIMETH/SULFA <=20 SENSITIVE Sensitive     AMPICILLIN/SULBACTAM 4 SENSITIVE Sensitive     PIP/TAZO <=4 SENSITIVE Sensitive     Extended ESBL NEGATIVE Sensitive     * 50,000 COLONIES/mL KLEBSIELLA PNEUMONIAE  MRSA PCR Screening     Status: None   Collection Time: 06/18/16  1:44 AM  Result Value Ref Range Status   MRSA by PCR NEGATIVE NEGATIVE Final    Comment:        The GeneXpert MRSA Assay (FDA approved for NASAL specimens only), is one component of a comprehensive MRSA colonization surveillance program. It is not intended to diagnose MRSA infection nor to guide or monitor treatment for MRSA infections.   Urine culture     Status: Abnormal   Collection Time: 06/18/16 10:45 AM  Result Value Ref Range Status   Specimen Description URINE, CLEAN CATCH  Final   Special Requests NONE  Final   Culture >=100,000 COLONIES/mL KLEBSIELLA PNEUMONIAE (A)  Final   Report Status 06/21/2016 FINAL  Final   Organism ID, Bacteria KLEBSIELLA PNEUMONIAE (A)  Final      Susceptibility   Klebsiella pneumoniae - MIC*    AMPICILLIN >=32 RESISTANT Resistant     CEFAZOLIN <=4 SENSITIVE Sensitive     CEFTRIAXONE <=1 SENSITIVE Sensitive     CIPROFLOXACIN <=0.25 SENSITIVE Sensitive     GENTAMICIN <=1 SENSITIVE Sensitive     IMIPENEM <=0.25 SENSITIVE Sensitive     NITROFURANTOIN 32  SENSITIVE Sensitive     TRIMETH/SULFA <=20 SENSITIVE Sensitive     AMPICILLIN/SULBACTAM 4 SENSITIVE Sensitive     PIP/TAZO <=4 SENSITIVE Sensitive     Extended ESBL NEGATIVE Sensitive     * >=100,000 COLONIES/mL KLEBSIELLA PNEUMONIAE  Culture, blood (routine x 2)     Status: None   Collection Time: 06/18/16   6:29 PM  Result Value Ref Range Status   Specimen Description BLOOD LEFT ANTECUBITAL  Final   Special Requests BOTTLES DRAWN AEROBIC AND ANAEROBIC 5CC  Final   Culture NO GROWTH 5 DAYS  Final   Report Status 06/23/2016 FINAL  Final  Culture, blood (routine x 2)     Status: None   Collection Time: 06/18/16  6:36 PM  Result Value Ref Range Status   Specimen Description BLOOD LEFT ARM  Final   Special Requests BOTTLES DRAWN AEROBIC AND ANAEROBIC 5CC  Final   Culture NO GROWTH 5 DAYS  Final   Report Status 06/23/2016 FINAL  Final         Radiology Studies: No results found.      Scheduled Meds: . atorvastatin  10 mg Oral q1800  .  ceFAZolin (ANCEF) IV  1 g Intravenous Q12H  . docusate sodium  100 mg Oral BID  . famotidine  20 mg Oral Daily  . fluticasone furoate-vilanterol  1 puff Inhalation Daily  . levothyroxine  50 mcg Oral QAC breakfast  . mouth rinse  15 mL Mouth Rinse BID  . metoprolol tartrate  12.5 mg Oral BID  . polyethylene glycol  17 g Oral Daily  . sodium bicarbonate  1,300 mg Oral TID  . tiotropium  18 mcg Inhalation Daily   Continuous Infusions:    LOS: 7 days    Time spent: 30 minutes    Dessa Phi, DO [facility] Pager [phone]  If 7PM-7AM, please contact night-coverage [email or web address] Password [email or web address] 06/24/2016, 1:43 PM

## 2016-06-24 NOTE — Progress Notes (Signed)
Nutrition Brief Note  Patient identified on the Malnutrition Screening Tool (MST) Report  Wt Readings from Last 15 Encounters:  06/24/16 170 lb 11.2 oz (77.4 kg)  01/18/16 172 lb 9.6 oz (78.3 kg)  12/14/15 174 lb (78.9 kg)  11/15/15 171 lb (77.6 kg)  06/16/15 146 lb 9.6 oz (66.5 kg)  12/14/14 182 lb (82.6 kg)  08/14/14 193 lb (87.5 kg)  04/08/14 181 lb 12.8 oz (82.5 kg)  02/09/14 174 lb 12.8 oz (79.3 kg)  09/11/13 175 lb 12.8 oz (79.7 kg)  09/11/13 175 lb (79.4 kg)  09/06/13 175 lb (79.4 kg)  08/19/13 177 lb (80.3 kg)  06/30/13 177 lb (80.3 kg)  05/19/13 174 lb (78.9 kg)   Ryan Hess is a 82 y.o. male with medical history significant of complex TAAA s/p EVAR at [facility], CKD stage 3, A.Fib on coumadin, HTN.  Patient presents to the ED with 3 weeks of intermittent melena that became consistent melena for the past 3 days or so.  No h/o N/V or abdominal pain.  No fever, no cough, no congestion.  Patient's family report that he is so weak he cannot walk which is why they brought him in.  Weakness is generalized without specific neurologic symptoms.  Pt admitted with melena with acute blood loss anemia.   Spoke with pt and pt daughter at bedside. Pt daughter reports pt experienced poor appetite 1 week PTA due to acute illness, however, appetite has since returned. Pt underwent BSE on 06/19/16 by SLP; recommended regular consistency diet with thin liquids. Pt generally has a good appetite and consumes 3 balanced meals per day. Pt lives with his son, who is very involved in his care, per pt daughter "he eats very healthy".   Pt daughter suspects mild wt loss due to poor appetite PTA. Reviewed wt hx, which reveals wt stability over the past year. UBW approximately 172#.   Nutrition-Focused physical exam completed. Findings are no fat depletion, mild muscle depletion, and mild edema.   Pt and daughter deny any further nutrition questions or concerns, however, express appreciation for visit.  Reinforced importance of good meal intake to promote healing.   Body mass index is 25.95 kg/m. Patient meets criteria for overweight based on current BMI.   Current diet order is Soft, patient is consuming approximately 75-100% of meals at this time. Labs and medications reviewed.   No nutrition interventions warranted at this time. If nutrition issues arise, please consult RD.   Evah Rashid A. Jimmye Norman, RD, LDN, CDE Pager: [phone] After hours Pager: [phone]

## 2016-06-25 DIAGNOSIS — N3000 Acute cystitis without hematuria: Secondary | ICD-10-CM

## 2016-06-25 DIAGNOSIS — L89329 Pressure ulcer of left buttock, unspecified stage: Secondary | ICD-10-CM

## 2016-06-25 LAB — RENAL FUNCTION PANEL
Albumin: 2 g/dL — ABNORMAL LOW (ref 3.5–5.0)
Anion gap: 7 (ref 5–15)
BUN: 60 mg/dL — ABNORMAL HIGH (ref 6–20)
CO2: 21 mmol/L — ABNORMAL LOW (ref 22–32)
Calcium: 7.7 mg/dL — ABNORMAL LOW (ref 8.9–10.3)
Chloride: 115 mmol/L — ABNORMAL HIGH (ref 101–111)
Creatinine, Ser: 4.11 mg/dL — ABNORMAL HIGH (ref 0.61–1.24)
GFR calc Af Amer: 13 mL/min — ABNORMAL LOW (ref >60)
GFR calc non Af Amer: 12 mL/min — ABNORMAL LOW (ref >60)
Glucose, Bld: 87 mg/dL (ref 65–99)
Phosphorus: 3.5 mg/dL (ref 2.5–4.6)
Potassium: 3.9 mmol/L (ref 3.5–5.1)
Sodium: 143 mmol/L (ref 135–145)

## 2016-06-25 LAB — CBC WITH DIFFERENTIAL/PLATELET
Basophils Absolute: 0 10*3/uL (ref 0.0–0.1)
Basophils Relative: 0 %
Eosinophils Absolute: 0.4 10*3/uL (ref 0.0–0.7)
Eosinophils Relative: 4 %
HCT: 27.9 % — ABNORMAL LOW (ref 39.0–52.0)
Hemoglobin: 9 g/dL — ABNORMAL LOW (ref 13.0–17.0)
Lymphocytes Relative: 15 %
Lymphs Abs: 1.3 10*3/uL (ref 0.7–4.0)
MCH: 30.7 pg (ref 26.0–34.0)
MCHC: 32.3 g/dL (ref 30.0–36.0)
MCV: 95.2 fL (ref 78.0–100.0)
Monocytes Absolute: 0.9 10*3/uL (ref 0.1–1.0)
Monocytes Relative: 11 %
Neutro Abs: 5.7 10*3/uL (ref 1.7–7.7)
Neutrophils Relative %: 69 %
Platelets: 87 10*3/uL — ABNORMAL LOW (ref 150–400)
RBC: 2.93 MIL/uL — ABNORMAL LOW (ref 4.22–5.81)
RDW: 15.8 % — ABNORMAL HIGH (ref 11.5–15.5)
WBC: 8.3 10*3/uL (ref 4.0–10.5)

## 2016-06-25 MED ORDER — CEPHALEXIN 500 MG PO CAPS
500.0000 mg | ORAL_CAPSULE | Freq: Two times a day (BID) | ORAL | Status: DC
Start: 2016-06-25 — End: 2016-06-26
  Administered 2016-06-25 – 2016-06-26 (×3): 500 mg via ORAL
  Filled 2016-06-25 (×3): qty 1

## 2016-06-25 NOTE — Progress Notes (Signed)
PROGRESS NOTE    Ryan Hess  [id number] DOB: 1924-03-04 DOA: 06/17/2016 PCP: Noralee Space, MD    Brief Narrative:  Ryan Hess is a 82 y.o. male with medical history significant of complex TAAA s/p EVAR at [facility], CKD stage 3, A.Fib on coumadin, HTN.  Patient presents to the ED with 3 weeks of intermittent melena that became consistent melena for the past 3 days or so.  No h/o N/V or abdominal pain.  No fever, no cough, no congestion.  Patient's family report that he is so weak he cannot walk which is why they brought him in.  Weakness is generalized without specific neurologic symptoms. He presented with hypotension, SBP in the 60s, AKI, UTI, melana, anemia, and supratherapeutic INR. He received vitamin K, pRBC transfusion, PPI. GI has seen patient and has signed off.   On 9/26, he developed an unresponsive episode while having Bm, possibly due to vagal response. MRI brain was ordered, which was negative for acute changes. Urology was consulted for mild hydro and recommended keep foley at time of discharge and outpatient follow up with urology.    Assessment & Plan:   Principal Problem:   GI bleed Active Problems:   Essential hypertension   Atrial fibrillation (HCC)   COPD (chronic obstructive pulmonary disease) with chronic bronchitis (HCC)   Acute blood loss anemia   Chronic systolic congestive heart failure (HCC)   Acute kidney failure (HCC)   Melena   UTI (lower urinary tract infection)   Metabolic acidosis, NAG, bicarbonate losses   CKD (chronic kidney disease) stage 3, GFR 30-59 ml/min   Pressure injury of skin   Melena with acute blood loss anemia -S/p prbc transfusion x2 units -GI signed off  -Spoke with daughter and son regarding restarting Coumadin. We need to weigh the benefits and risks of restarting blood thinner since patient has had melanotic stools versus benefit offered for stroke prevention. Would favor holding coumadin in setting of decreasing Hgb  without overt bleed  -Monitor CBC   AKI on CKD III -Cr on admission is 5.39, baseline around 2 -AKI likely due to uti/dehydration/gi bleed/obstruction  -Renal US on 9/24 new mild left sided hydronephrosis, new since ct ab on 9/23, thick walled bladder with debris, foley placed, urology consulted, per urology will keep foley in at discharge and outpatient follow up with urology. -Patient/family should call [facility] on discharge to schedule follow up appointment with Dr. Jeffie Pollock or partner.  -No angiotensin active agents -Monitor BMP  -Appreciate Nephrology   Klebsiella UTI, recurrent, unknown if present on admission -Switch to keflex, for total 10 day course (10/2 last day)   Metabolic acidosis  -Was on bicarb drip, changed to oral supplement -Appreciate Nephrology consult  -Improving   Chronic afib -Rate controlled, betablocker held initially due to hypotension -Lopressor restarted  -On coumadin at home which is held since admission due to gi bleed  Chronic systolic chf -Last EF in 123456 45-50% -Ordered ted hose   COPD -Stable  -Continue home meds  Dementia -Not oriented to time or place,very supportive family in room 24/7 for assistance  Thrombocytopenia -Monitor trend. No exposure to heparin product, no physical signs to suggest DVT. -Stable  Bilateral pleural effusion -IVF discontinued  HLD -Lipitor   Hypothyroidism -Synthroid   AAA s/p endoscopic repair -7cm , unchanged   DVT prophylaxis: SCDs Code Status: DNR Family Communication: son at bedside  Disposition Plan: Continue to monitor kidney function. Patient with improvement today.  Pt:  Home health PT    Consultants:   [place]  [facility]   Nephrology   Procedures:   pRBC transfusion x2units on 9/23-24night  Antimicrobials:   Rocephin from 9/23 to 9/26  Cefazolin from 9/26 >>> 10/1  Keflex 10/1 - 10/2    Subjective: Patient doing well this morning. He has no  specific complaints and states that he is eating well. No chest pain or shortness of breath.  Objective: Vitals:   06/24/16 1230 06/24/16 2025 06/25/16 0433 06/25/16 0833  BP: (!) 137/43 136/67 106/79   Pulse: 81 80 (!) 59   Resp: 20 20 19    Temp: 97.3 F (36.3 C) 97.9 F (36.6 C) 97.7 F (36.5 C)   TempSrc: Oral Oral Oral   SpO2: 92% 97% 99% 98%  Weight:   76.7 kg (169 lb 1.6 oz)   Height:        Intake/Output Summary (Last 24 hours) at 06/25/16 1228 Last data filed at 06/25/16 1000  Gross per 24 hour  Intake              580 ml  Output             1203 ml  Net             -623 ml   Filed Weights   06/23/16 0736 06/24/16 0642 06/25/16 0433  Weight: 76.1 kg (167 lb 12.8 oz) 77.4 kg (170 lb 11.2 oz) 76.7 kg (169 lb 1.6 oz)    Examination:  General exam: Appears calm and comfortable Respiratory system: Clear to auscultation. Respiratory effort normal. Cardiovascular system: S1 & S2 heard, irreg irreg. No JVD, murmurs, rubs, gallops or clicks. +1 pedal edema Gastrointestinal system: Abdomen is nondistended, soft and nontender. No organomegaly or masses felt. Normal bowel sounds heard. Central nervous system: Alert Extremities: Moves all extremities spontaneously  Data Reviewed: I have personally reviewed following labs and imaging studies  CBC:  Recent Labs Lab 06/21/16 0400 06/21/16 1640 06/22/16 0509 06/23/16 0449 06/24/16 0259 06/25/16 0345  WBC 13.2*  --  8.7 7.0 6.3 8.3  NEUTROABS  --  9.4* 6.8 4.9 4.3 5.7  HGB 10.7*  --  9.9* 8.7* 8.7* 9.0*  HCT 32.6*  --  29.8* 26.5* 26.8* 27.9*  MCV 93.9  --  95.5 94.3 95.7 95.2  PLT 94*  --  80* 85* 73* 87*   Basic Metabolic Panel:  Recent Labs Lab 06/21/16 0400 06/22/16 0517 06/23/16 0449 06/24/16 0259 06/25/16 0345  NA 139 140 140 142 143  K 5.7* 5.6* 5.2* 4.0 3.9  CL 114* 116* 113* 115* 115*  CO2 16* 15* 17* 20* 21*  GLUCOSE 94 70 80 114* 87  BUN 86* 80* 76* 72* 60*  CREATININE 4.60* 4.70* 4.73* 4.58*  4.11*  CALCIUM 8.5* 8.3* 7.9* 7.6* 7.7*  PHOS  --  3.3 3.8 3.8 3.5   GFR: Estimated Creatinine Clearance: 11.6 mL/min (by C-G formula based on SCr of 4.11 mg/dL (H)). Liver Function Tests:  Recent Labs Lab 06/22/16 0517 06/23/16 0449 06/24/16 0259 06/25/16 0345  ALBUMIN 2.4* 2.1* 2.0* 2.0*   No results for input(s): LIPASE, AMYLASE in the last 168 hours. No results for input(s): AMMONIA in the last 168 hours. Coagulation Profile:  Recent Labs Lab 06/19/16 0906 06/20/16 0241 06/21/16 0400 06/22/16 0509  INR 1.38 1.39 1.41 1.48   Cardiac Enzymes: No results for input(s): CKTOTAL, CKMB, CKMBINDEX, TROPONINI in the last 168 hours. BNP (last 3 results)  Recent Labs  12/14/15 1219  PROBNP 462.0*   HbA1C: No results for input(s): HGBA1C in the last 72 hours. CBG:  Recent Labs Lab 06/20/16 0523  GLUCAP 86   Lipid Profile: No results for input(s): CHOL, HDL, LDLCALC, TRIG, CHOLHDL, LDLDIRECT in the last 72 hours. Thyroid Function Tests: No results for input(s): TSH, T4TOTAL, FREET4, T3FREE, THYROIDAB in the last 72 hours. Anemia Panel: No results for input(s): VITAMINB12, FOLATE, FERRITIN, TIBC, IRON, RETICCTPCT in the last 72 hours. Sepsis Labs:  Recent Labs Lab 06/20/16 0241  LATICACIDVEN 1.3    Recent Results (from the past 240 hour(s))  Urine culture     Status: Abnormal   Collection Time: 06/17/16  5:00 PM  Result Value Ref Range Status   Specimen Description URINE, RANDOM  Final   Special Requests NONE  Final   Culture 50,000 COLONIES/mL KLEBSIELLA PNEUMONIAE (A)  Final   Report Status 06/19/2016 FINAL  Final   Organism ID, Bacteria KLEBSIELLA PNEUMONIAE (A)  Final      Susceptibility   Klebsiella pneumoniae - MIC*    AMPICILLIN >=32 RESISTANT Resistant     CEFAZOLIN <=4 SENSITIVE Sensitive     CEFTRIAXONE <=1 SENSITIVE Sensitive     CIPROFLOXACIN <=0.25 SENSITIVE Sensitive     GENTAMICIN <=1 SENSITIVE Sensitive     IMIPENEM <=0.25 SENSITIVE  Sensitive     NITROFURANTOIN 32 SENSITIVE Sensitive     TRIMETH/SULFA <=20 SENSITIVE Sensitive     AMPICILLIN/SULBACTAM 4 SENSITIVE Sensitive     PIP/TAZO <=4 SENSITIVE Sensitive     Extended ESBL NEGATIVE Sensitive     * 50,000 COLONIES/mL KLEBSIELLA PNEUMONIAE  MRSA PCR Screening     Status: None   Collection Time: 06/18/16  1:44 AM  Result Value Ref Range Status   MRSA by PCR NEGATIVE NEGATIVE Final    Comment:        The GeneXpert MRSA Assay (FDA approved for NASAL specimens only), is one component of a comprehensive MRSA colonization surveillance program. It is not intended to diagnose MRSA infection nor to guide or monitor treatment for MRSA infections.   Urine culture     Status: Abnormal   Collection Time: 06/18/16 10:45 AM  Result Value Ref Range Status   Specimen Description URINE, CLEAN CATCH  Final   Special Requests NONE  Final   Culture >=100,000 COLONIES/mL KLEBSIELLA PNEUMONIAE (A)  Final   Report Status 06/21/2016 FINAL  Final   Organism ID, Bacteria KLEBSIELLA PNEUMONIAE (A)  Final      Susceptibility   Klebsiella pneumoniae - MIC*    AMPICILLIN >=32 RESISTANT Resistant     CEFAZOLIN <=4 SENSITIVE Sensitive     CEFTRIAXONE <=1 SENSITIVE Sensitive     CIPROFLOXACIN <=0.25 SENSITIVE Sensitive     GENTAMICIN <=1 SENSITIVE Sensitive     IMIPENEM <=0.25 SENSITIVE Sensitive     NITROFURANTOIN 32 SENSITIVE Sensitive     TRIMETH/SULFA <=20 SENSITIVE Sensitive     AMPICILLIN/SULBACTAM 4 SENSITIVE Sensitive     PIP/TAZO <=4 SENSITIVE Sensitive     Extended ESBL NEGATIVE Sensitive     * >=100,000 COLONIES/mL KLEBSIELLA PNEUMONIAE  Culture, blood (routine x 2)     Status: None   Collection Time: 06/18/16  6:29 PM  Result Value Ref Range Status   Specimen Description BLOOD LEFT ANTECUBITAL  Final   Special Requests BOTTLES DRAWN AEROBIC AND ANAEROBIC 5CC  Final   Culture NO GROWTH 5 DAYS  Final   Report Status 06/23/2016 FINAL  Final  Culture, blood (routine  x  2)     Status: None   Collection Time: 06/18/16  6:36 PM  Result Value Ref Range Status   Specimen Description BLOOD LEFT ARM  Final   Special Requests BOTTLES DRAWN AEROBIC AND ANAEROBIC 5CC  Final   Culture NO GROWTH 5 DAYS  Final   Report Status 06/23/2016 FINAL  Final         Radiology Studies: No results found.      Scheduled Meds: . atorvastatin  10 mg Oral q1800  . cephALEXin  500 mg Oral Q12H  . docusate sodium  100 mg Oral BID  . famotidine  20 mg Oral Daily  . fluticasone furoate-vilanterol  1 puff Inhalation Daily  . levothyroxine  50 mcg Oral QAC breakfast  . mouth rinse  15 mL Mouth Rinse BID  . metoprolol tartrate  12.5 mg Oral BID  . polyethylene glycol  17 g Oral Daily  . sodium bicarbonate  1,300 mg Oral TID  . tiotropium  18 mcg Inhalation Daily   Continuous Infusions:    LOS: 8 days    Time spent: 30 minutes    Dessa Phi, DO [facility] Pager [phone]  If 7PM-7AM, please contact night-coverage [email or web address] Password [email or web address] 06/25/2016, 12:28 PM

## 2016-06-25 NOTE — Progress Notes (Signed)
Subjective: Interval History: has no complaint, slept well.  Objective: Vital signs in last 24 hours: Temp:  [97.3 F (36.3 C)-97.9 F (36.6 C)] 97.7 F (36.5 C) (10/01 0433) Pulse Rate:  [59-111] 59 (10/01 0433) Resp:  [19-20] 19 (10/01 0433) BP: (106-137)/(43-79) 106/79 (10/01 0433) SpO2:  [92 %-99 %] 99 % (10/01 0433) Weight:  [76.7 kg (169 lb 1.6 oz)] 76.7 kg (169 lb 1.6 oz) (10/01 0433) Weight change: 0.59 kg (1 lb 4.8 oz)  Intake/Output from previous day: 09/30 0701 - 10/01 0700 In: 820 [P.O.:820] Out: 1103 [Urine:1101; Stool:2] Intake/Output this shift: No intake/output data recorded.  General appearance: alert, cooperative, pale and Ox1 Resp: diminished breath sounds bilaterally and rales RLL Cardio: irregularly irregular rhythm and systolic murmur: holosystolic 2/6, blowing at apex GI: pos bs,soft,liver down 4 cm Extremities: edema 1+  Lab Results:  Recent Labs  06/24/16 0259 06/25/16 0345  WBC 6.3 8.3  HGB 8.7* 9.0*  HCT 26.8* 27.9*  PLT 73* 87*   BMET:  Recent Labs  06/24/16 0259 06/25/16 0345  NA 142 143  K 4.0 3.9  CL 115* 115*  CO2 20* 21*  GLUCOSE 114* 87  BUN 72* 60*  CREATININE 4.58* 4.11*  CALCIUM 7.6* 7.7*   No results for input(s): PTH in the last 72 hours. Iron Studies: No results for input(s): IRON, TIBC, TRANSFERRIN, FERRITIN in the last 72 hours.  Studies/Results: No results found.  I have reviewed the patient's current medications.  Assessment/Plan: 1 CKD 4/AKI improving slowly  Acid/base improving with bicarb. Needs foley for obstruction. 2 BOO 3Anemia stable,got Fe 4 HPTH start Vit D 5 Low ptlt 6 Dementia 7 Afib 8 COPD 9GIB  stable P diet,conservative mgmt, Na bicarb.allow to diurese, restart Lasix in future, NO ANGIOTENSIN ACTIVE AGENTS    LOS: 8 days   Marwah Disbro L 06/25/2016,8:30 AM

## 2016-06-26 ENCOUNTER — Other Ambulatory Visit: Payer: Self-pay | Admitting: Pulmonary Disease

## 2016-06-26 LAB — CBC WITH DIFFERENTIAL/PLATELET
Basophils Absolute: 0.1 10*3/uL (ref 0.0–0.1)
Basophils Relative: 1 %
Eosinophils Absolute: 0.3 10*3/uL (ref 0.0–0.7)
Eosinophils Relative: 4 %
HCT: 27.6 % — ABNORMAL LOW (ref 39.0–52.0)
Hemoglobin: 8.9 g/dL — ABNORMAL LOW (ref 13.0–17.0)
Lymphocytes Relative: 16 %
Lymphs Abs: 1.4 10*3/uL (ref 0.7–4.0)
MCH: 30.8 pg (ref 26.0–34.0)
MCHC: 32.2 g/dL (ref 30.0–36.0)
MCV: 95.5 fL (ref 78.0–100.0)
Monocytes Absolute: 0.7 10*3/uL (ref 0.1–1.0)
Monocytes Relative: 8 %
Neutro Abs: 6 10*3/uL (ref 1.7–7.7)
Neutrophils Relative %: 72 %
Platelets: 109 10*3/uL — ABNORMAL LOW (ref 150–400)
RBC: 2.89 MIL/uL — ABNORMAL LOW (ref 4.22–5.81)
RDW: 15.5 % (ref 11.5–15.5)
WBC: 8.4 10*3/uL (ref 4.0–10.5)

## 2016-06-26 LAB — RENAL FUNCTION PANEL
Albumin: 2 g/dL — ABNORMAL LOW (ref 3.5–5.0)
Anion gap: 6 (ref 5–15)
BUN: 55 mg/dL — ABNORMAL HIGH (ref 6–20)
CO2: 22 mmol/L (ref 22–32)
Calcium: 7.7 mg/dL — ABNORMAL LOW (ref 8.9–10.3)
Chloride: 116 mmol/L — ABNORMAL HIGH (ref 101–111)
Creatinine, Ser: 3.92 mg/dL — ABNORMAL HIGH (ref 0.61–1.24)
GFR calc Af Amer: 14 mL/min — ABNORMAL LOW (ref >60)
GFR calc non Af Amer: 12 mL/min — ABNORMAL LOW (ref >60)
Glucose, Bld: 94 mg/dL (ref 65–99)
Phosphorus: 3.3 mg/dL (ref 2.5–4.6)
Potassium: 4 mmol/L (ref 3.5–5.1)
Sodium: 144 mmol/L (ref 135–145)

## 2016-06-26 NOTE — Care Management Important Message (Signed)
Important Message  Patient Details  Name: Ryan Hess MRN: [id number] Date of Birth: 1924/05/25   Medicare Important Message Given:  Yes    Huda Petrey Montine [address] 06/26/2016, 12:48 PM

## 2016-06-26 NOTE — Discharge Summary (Signed)
Physician Discharge Summary  Ryan Hess [id number] DOB: Jul 13, 1924 DOA: 06/17/2016  PCP: Noralee Space, MD  Admit date: 06/17/2016 Discharge date: 06/26/2016  Admitted From: Home Disposition:  Home   Recommendations for Outpatient Follow-up:  1. Follow up with PCP in 1-2 weeks 2. Follow up with Urology [facility] on discharge to schedule follow up appointment with Dr. Jeffie Pollock or partner). Leave foley in place until follow up outpatient.  3. Please obtain BMP/CBC in one week  Home Health: PT. Supervision for mobility Equipment/Devices: None    Discharge Condition: Stable CODE STATUS: DNR  Diet recommendation: General   Brief/Interim Summary: Ryan Hess is a 82 y.o.malewith medical history significant of complex TAAA s/p EVAR at [facility], CKD stage 3, A.Fib on coumadin, HTN. Patient presents to the ED with 3 weeks of intermittent melena that became consistent melena for the past 3 days or so. No h/o N/V or abdominal pain. No fever, no cough, no congestion. Patient's family report that he is so weak he cannot walk which is why they brought him in. Weakness is generalized without specific neurologic symptoms. He presented with hypotension, SBP in the 60s, AKI, UTI, melana, anemia, and supratherapeutic INR. He received vitamin K, pRBC transfusion, PPI. GI has seen patient and has signed off. Coumadin was held throughout his hospitalization. This was extensively discussed with family members for benefit and risk of restarting Coumadin. We decided to stop Coumadin at this time. This can be further discussed with primary care physician. Patient did receive 2 units of blood transfusion during his hospitalization. No further bleeding episode was noted.   On 9/26, he developed an unresponsive episode while having Bm, possibly due to vagal response. MRI brain was ordered, which was negative for acute changes.  Urology was consulted for mild hydro and recommended keep  foley at time of discharge and outpatient follow up with urology.   Patient also had acute kidney injury. Creatinine on admission was 5.39 with baseline around 2. Nephrology was consulted. Patient improved slowly. He needs to stop angiotensin active agents and restart lasix in the future.   Patient was also treated for Klebsiella UTI with total 10 day course of cephalosporin. Antibiotics were finished on the day of discharge.  Discharge Diagnoses:  Principal Problem:   GI bleed Active Problems:   Essential hypertension   Atrial fibrillation (HCC)   COPD (chronic obstructive pulmonary disease) with chronic bronchitis (HCC)   Acute blood loss anemia   Chronic systolic congestive heart failure (HCC)   Acute kidney failure (HCC)   Melena   UTI (lower urinary tract infection)   Metabolic acidosis, NAG, bicarbonate losses   CKD (chronic kidney disease) stage 3, GFR 30-59 ml/min   Pressure injury of skin  Melena with acute blood loss anemia -S/p prbc transfusion x2 units -GI signed off  -Would favor holding coumadin for now  -Monitor CBC   AKI on CKD III -Cr on admission is 5.39, baseline around 2 -AKI likely due to uti/dehydration/gi bleed/obstruction  -Renal US on 9/24 new mild left sided hydronephrosis, new since ct ab on 9/23, thick walled bladder with debris, foley placed, urology consulted, per urology will keep foley in at discharge and outpatient follow up with urology. -Patient/family should call [facility] on discharge to schedule follow up appointment with Dr. Jeffie Pollock or partner.  -No angiotensin active agents -Monitor BMP  -Appreciate Nephrology   Klebsiella UTI, recurrent, unknown if present on admission -Switch to keflex, for total 10 day course (  10/2 last day)   Metabolic acidosis -Was on bicarb drip, changed to oral supplement, stop on discharge  -Appreciate Nephrology consult  -Improving   Chronic afib -Rate controlled, betablocker held  initially due to hypotension -Lopressor restarted  -On coumadin at home which is held since admission due to gi bleed  Chronic systolic chf -Last EF in 123456 45-50% -Ordered ted hose   COPD -Stable  -Continue home meds  Dementia -Not oriented to time or place,very supportivefamily in room 24/7 for assistance  Thrombocytopenia -Monitor trend. No exposure to heparin product, no physical signs to suggest DVT. -Stable  Bilateral pleural effusion -IVF discontinued  HLD -Lipitor   Hypothyroidism -Synthroid   AAA s/p endoscopic repair -7cm , unchanged   Discharge Instructions  Discharge Instructions    Diet - low sodium heart healthy    Complete by:  As directed    Face-to-face encounter (required for Medicare/Medicaid patients)    Complete by:  As directed    I Xu,Fang certify that this patient is under my care and that I, or a nurse practitioner or physician's assistant working with me, had a face-to-face encounter that meets the physician face-to-face encounter requirements with this patient on 06/18/2016. The encounter with the patient was in whole, or in part for the following medical condition(s) which is the primary reason for home health care (List medical condition): FTT Need home health RN to check INR and send result to coumadin clinic   The encounter with the patient was in whole, or in part, for the following medical condition, which is the primary reason for home health care:  FTT   I certify that, based on my findings, the following services are medically necessary home health services:   Nursing Physical therapy     Reason for Medically Necessary Home Health Services:  Skilled Nursing- Change/Decline in Patient Status   My clinical findings support the need for the above services:  Cognitive impairments, dementia, or mental confusion  that make it unsafe to leave home   Further, I certify that my clinical findings support that this patient is homebound  due to:  Mental confusion   Home Health    Complete by:  As directed    To provide the following care/treatments:   PT [employer] work     Increase activity slowly    Complete by:  As directed        Medication List    STOP taking these medications   aspirin 81 MG tablet   furosemide 40 MG tablet Commonly known as:  LASIX   ibuprofen 200 MG tablet Commonly known as:  ADVIL,MOTRIN   losartan 100 MG tablet Commonly known as:  COZAAR   warfarin 2.5 MG tablet Commonly known as:  COUMADIN     TAKE these medications   atorvastatin 10 MG tablet Commonly known as:  LIPITOR TAKE 1 TABLET BY MOUTH DAILY What changed:  See the new instructions.   Fluticasone-Salmeterol 250-50 MCG/DOSE Aepb Commonly known as:  ADVAIR DISKUS inhale 1 dose by mouth twice a day What changed:  how much to take  how to take this  when to take this  additional instructions   guaiFENesin 600 MG 12 hr tablet Commonly known as:  MUCINEX Take 600 mg by mouth 2 (two) times daily. COPD.   levothyroxine 50 MCG tablet Commonly known as:  SYNTHROID, LEVOTHROID TAKE 1 TABLET BY MOUTH DAILY What changed:  See the new instructions.  metoprolol succinate 25 MG 24 hr tablet Commonly known as:  TOPROL XL Take 1 tablet (25 mg total) by mouth daily.   multivitamin tablet Take 1 tablet by mouth daily.   neomycin-polymyxin b-dexamethasone 3.5-10000-0.1 Oint Commonly known as:  MAXITROL Place 1 application into both eyes as needed (for eye irriation or itching).   tiotropium 18 MCG inhalation capsule Commonly known as:  SPIRIVA HANDIHALER USE 2 INHALATIONS FROM ONE CAPSULE DAILY AS DIRECTED What changed:  how much to take  how to take this  additional instructions   traMADol 50 MG tablet Commonly known as:  ULTRAM TAKE 1 TABLET BY MOUTH THREE TIMES DAILY AS NEEDED FOR PAIN What changed:  See the new instructions.      Follow-up Information    [facility] .   Why:  a home health care nurse and physical therapist will go to your home at discharge Contact information: [address] [place] [address] [phone]        Noralee Space, MD. Schedule an appointment as soon as possible for a visit in 1 week(s).   Specialty:  Pulmonary Disease Contact information: [place] [address] [phone]        Malka So, MD. Schedule an appointment as soon as possible for a visit in 1 week(s).   Specialty:  Urology Contact information: [place] [address] [phone]          No Known Allergies  Consultations:  GI  Urology  Nephrology   Procedures/Studies: Ct Abdomen Pelvis Wo Contrast  Result Date: 06/20/2016 CLINICAL DATA:  Diffuse abdominal pain. EXAM: CT ABDOMEN AND PELVIS WITHOUT CONTRAST TECHNIQUE: Multidetector CT imaging of the abdomen and pelvis was performed following the standard protocol without IV contrast. COMPARISON:  CT scan June 17, 2016 FINDINGS: Lower chest: Bilateral pleural effusions and underlying atelectasis are identified. Emphysematous changes are seen in the bases, most prominent in the lingula. Suggested mild edema. Coronary artery calcifications. Hepatobiliary: A calcification is either in the liver or adjacent gallbladder on series 2, image 28, unchanged. The liver and gallbladder are otherwise normal. Pancreas: Unremarkable. No pancreatic ductal dilatation or surrounding inflammatory changes. Spleen: Normal in size without focal abnormality. Adrenals/Urinary Tract: An anterior superior right renal cyst is stable. No evidence of renal obstruction or ureteral stones. The kidneys are otherwise stable. Stomach/Bowel: The stomach and small bowel are normal. The colon is unremarkable. The appendix is not seen but there is no secondary evidence of appendicitis. Vascular/Lymphatic: An abdominal aortic aneurysm is again identified with endovascular  repair. The aneurysm measures up to 7 cm, unchanged. No evidence of leak. Stents are also seen in the celiac, superior mesenteric, and renal arteries. No adenopathy. Reproductive: No acute abnormalities in the prostate or seminal vesicles. Other: Diffuse mild increased attenuation in the fat of the abdomen and pelvis in addition to the subcutaneous tissues suggests volume overload. There is wall thickening associated with the superior anterior bladder, mildly worsened in the interval. There appears to be a catheter in the penile urethra with a rounded collection of fluid on series 2, image 107. This is suspicious for inflation of the balloon within the penile urethra. Musculoskeletal: Loss of height of L4 is stable. Degenerative changes seen within the spine. No acute bony abnormalities are identified. IMPRESSION: 1. The Foley catheter balloon is inflated within the penile urethra. Recommend repositioning. 2. Bilateral pleural effusions, mild pulmonary edema, and increased attenuation of the subcutaneous and intra-abdominal  fat, all consistent with volume overload. 3. Abdominal aortic aneurysm with endovascular repair, unchanged. These results will be called to the ordering clinician or representative by the Radiologist Assistant, and communication documented in the PACS or zVision Dashboard. Electronically Signed   By: Dorise Bullion III M.D   On: 06/20/2016 15:15   Ct Abdomen Pelvis Wo Contrast  Result Date: 06/17/2016 CLINICAL DATA:  Fatigue and weakness for several weeks with black stools. History of thoracoabdominal aortic aneurysm repair in July 2014. EXAM: CT ABDOMEN AND PELVIS WITHOUT CONTRAST TECHNIQUE: Multidetector CT imaging of the abdomen and pelvis was performed following the standard protocol without IV contrast. COMPARISON:  Outside CT abdomen/pelvis from 05/19/2015. FINDINGS: Lower chest: Patchy subpleural reticulation at both lung bases. Centrilobular emphysema at the lung bases. Partially  visualized bandlike focus of consolidation in the posterior right lower lobe is not definitely changed. Coronary atherosclerosis. Fluid is seen in the lower thoracic esophagus. Hepatobiliary: Diffuse hepatic steatosis. No liver mass. No appreciable cholelithiasis. A subcentimeter coarse calcification at the gallbladder fossa is favored to represent a calcified liver granuloma and is unchanged. No definite gallbladder wall thickening or pericholecystic fluid. No biliary ductal dilatation. Pancreas: Diffuse fatty infiltration of the otherwise unremarkable pancreas, with no mass or duct dilation. Spleen: Normal size. No mass. Adrenals/Urinary Tract: Normal adrenals. No right hydronephrosis. Fullness of the left renal collecting system without overt left hydronephrosis. Punctate nonobstructing 2 mm stone in the lower left kidney. No right renal stones. Simple 4.1 cm renal cyst in the anterior upper right kidney. Subcentimeter hypodense renal cortical lesions in the interpolar left kidney, too small to characterize, for which no further follow-up is required. Stable diffuse bladder wall thickening with mild bladder distention. No bladder stones. Stomach/Bowel: Grossly normal stomach. Normal caliber small bowel with no small bowel wall thickening. Appendectomy . Mild sigmoid diverticulosis, with no large bowel wall thickening or pericolonic fat stranding. Mild-to-moderate colorectal stool volume. Vascular/Lymphatic: Aortic atherosclerosis. There is a complex thoracoabdominal aortic aneurysm status post partially visualized aorto bi-iliac stent graft, with maximum aortic aneurysm diameter 7.0 cm, not appreciably changed from 05/19/2015. Celiac trunk, superior mesenteric artery and bilateral renal artery stents are in place. No acute para-aortic fluid collections. No pathologically enlarged lymph nodes in the abdomen or pelvis. Reproductive: Normal size prostate. Fiducial markers are seen in the prostate. Other: No  pneumoperitoneum, ascites or focal fluid collection. Musculoskeletal: No aggressive appearing focal osseous lesions. Moderate thoracolumbar spondylosis. Stable moderate to severe L4 vertebral compression fracture. IMPRESSION: 1. No acute abnormality. No evidence of bowel obstruction or acute bowel inflammation. Mild sigmoid diverticulosis, with no evidence of acute diverticulitis. 2. Stable noncontrast CT appearance of the visualized portions of the complex thoracoabdominal aneurysm status post aortobi-iliac stent graft, measuring 7.0 cm in maximum diameter. 3. Stable chronic diffuse bladder wall thickening with mild bladder distention. 4. Additional findings include aortic atherosclerosis, chronic L4 vertebral compression fracture, coronary atherosclerosis, centrilobular emphysema at the lung bases, punctate nonobstructing left renal stone and diffuse hepatic steatosis. Electronically Signed   By: Ilona Sorrel M.D.   On: 06/17/2016 23:13   Mr Brain Wo Contrast  Result Date: 06/21/2016 CLINICAL DATA:  Syncopal episode after bowel movement. Unresponsive. History of atrial fibrillation, hypertension, hypercholesterolemia. EXAM: MRI HEAD WITHOUT CONTRAST TECHNIQUE: Multiplanar, multiecho pulse sequences of the brain and surrounding structures were obtained without intravenous contrast. COMPARISON:  MRI head Feb 06, 2012 FINDINGS: Mildly motion degraded examination. BRAIN: No reduced diffusion to suggest acute ischemia. No susceptibility artifact to suggest hemorrhage. Moderate  to severe stable ventriculomegaly on the basis of global parenchymal brain volume loss. No midline shift, mass effect or masses. 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 Patchy to confluent supratentorial white matter FLAIR T2 hyperintensities. Old RIGHT thalamus lacunar infarct. Prominent bilateral basal ganglia perivascular spaces are similar. No abnormal extra-axial fluid collections. Move VASCULAR: Attenuated LEFT vertebral artery and basilar  artery. Dolicoectatic internal carotid arteries present previously. SKULL AND UPPER CERVICAL SPINE: No abnormal sellar expansion. Bright T1 bone marrow signal compatible with osteopenia. No suspicious calvarial bone marrow signal. Craniocervical junction maintained. SINUSES/ORBITS: The mastoid air-cells and included paranasal sinuses are well-aerated. Status post bilateral ocular lens implants. The included ocular globes and orbital contents are non-suspicious. OTHER: None. IMPRESSION: No acute intracranial process. Stable examination including attenuated LEFT vertebral artery and basilar artery which can be seen with slow flow, occlusion or hypoplasia. Involutional changes, moderate chronic small vessel ischemic disease and old RIGHT thalamus lacunar infarct. Electronically Signed   By: Elon Alas M.D.   On: 06/21/2016 01:10   US Renal  Result Date: 06/22/2016 CLINICAL DATA:  Renal insufficiency. EXAM: RENAL / URINARY TRACT ULTRASOUND COMPLETE COMPARISON:  CT scan 06/20/2016 FINDINGS: Right Kidney: Length: 11.2 cm. Diffuse increased echogenicity and renal cortical thinning with prominent renal sinus fat. No hydronephrosis, renal mass or renal calculi. There is a 4.1 x 4.1 x 3.7 cm cyst Left Kidney: Length: 10.3 cm. Diffuse increased echogenicity and renal cortical thinning with prominent renal sinus fat. No hydronephrosis, renal mass or renal calculus. Bladder: Decompressed by a Foley catheter. IMPRESSION: Diffuse increased echogenicity of both kidneys and renal cortical thinning could consistent with medical renal disease. No hydronephrosis. 4 cm upper pole right renal cyst. Electronically Signed   By: Marijo Sanes M.D.   On: 06/22/2016 12:15   US Renal  Result Date: 06/18/2016 CLINICAL DATA:  Elevated creatinine. EXAM: RENAL / URINARY TRACT ULTRASOUND COMPLETE COMPARISON:  CT of 1 day prior FINDINGS: Right Kidney: Length: 9.7 cm. Increased echogenicity. Cortical thinning. Interpolar right renal  cyst measures 4.2 cm. Left Kidney: Length: 11.1 cm. Mild left-sided hydronephrosis, new since yesterday's CT. Normal renal cortical thickness and echogenicity for age. Bladder: Bladder distension with debris within.  Mild wall thickening. IMPRESSION: 1. Mild left-sided hydronephrosis, new since yesterday's CT. This may be secondary to bladder distention. 2. Distended thick-walled bladder with debris within. This is most consistent with a component of bladder outlet obstruction. 3. Right-sided renal cortical thinning with increased echogenicity, suggesting medical renal disease. Electronically Signed   By: Abigail Miyamoto M.D.   On: 06/18/2016 13:34   Dg Abd Portable 1v  Result Date: 06/20/2016 CLINICAL DATA:  82 year old male with abdominal pain. EXAM: PORTABLE ABDOMEN - 1 VIEW COMPARISON:  Abdominal CT dated 06/17/2016 FINDINGS: There is moderate stool throughout the colon. No bowel dilatation or evidence of obstruction. No free air or radiopaque calculi identified. There is thoracolumbar aortic aneurysm endovascular stent graft repair. Bilateral renal artery stents also noted. There is degenerative changes of the spine. Chronic L4 compression deformity. No acute fracture. IMPRESSION: No bowel obstruction or free air. Endovascular stent graft repair of the thoracolumbar aortic aneurysm. Electronically Signed   By: Anner Crete M.D.   On: 06/20/2016 07:01     Subjective: Patient doing well this morning. Sitting in chair. He has no specific complaints and states that he is eating well. No chest pain or shortness of breath.  Discharge Exam: Vitals:   06/26/16 1100 06/26/16 1203  BP: (!) 147/79 124/62  Pulse: 69  73  Resp:  18  Temp:  98.2 F (36.8 C)   Vitals:   06/26/16 0534 06/26/16 0829 06/26/16 1100 06/26/16 1203  BP: 132/75  (!) 147/79 124/62  Pulse: 78 80 69 73  Resp: '19 19  18  '$ Temp: 98.1 F (36.7 C)   98.2 F (36.8 C)  TempSrc: Oral   Oral  SpO2: 99% 98%  98%  Weight: 78.4 kg (172  lb 14.4 oz)     Height:       General exam: Appears calm and comfortable Respiratory system: Clear to auscultation. Respiratory effort normal. Cardiovascular system: S1 & S2 heard, irreg irreg. No JVD, murmurs, rubs, gallops or clicks. +1 pedal edema Gastrointestinal system: Abdomen is nondistended, soft and nontender. No organomegaly or masses felt. Normal bowel sounds heard. Central nervous system: Alert Extremities: Moves all extremities spontaneously  The results of significant diagnostics from this hospitalization (including imaging, microbiology, ancillary and laboratory) are listed below for reference.     Microbiology: Recent Results (from the past 240 hour(s))  Urine culture     Status: Abnormal   Collection Time: 06/17/16  5:00 PM  Result Value Ref Range Status   Specimen Description URINE, RANDOM  Final   Special Requests NONE  Final   Culture 50,000 COLONIES/mL KLEBSIELLA PNEUMONIAE (A)  Final   Report Status 06/19/2016 FINAL  Final   Organism ID, Bacteria KLEBSIELLA PNEUMONIAE (A)  Final      Susceptibility   Klebsiella pneumoniae - MIC*    AMPICILLIN >=32 RESISTANT Resistant     CEFAZOLIN <=4 SENSITIVE Sensitive     CEFTRIAXONE <=1 SENSITIVE Sensitive     CIPROFLOXACIN <=0.25 SENSITIVE Sensitive     GENTAMICIN <=1 SENSITIVE Sensitive     IMIPENEM <=0.25 SENSITIVE Sensitive     NITROFURANTOIN 32 SENSITIVE Sensitive     TRIMETH/SULFA <=20 SENSITIVE Sensitive     AMPICILLIN/SULBACTAM 4 SENSITIVE Sensitive     PIP/TAZO <=4 SENSITIVE Sensitive     Extended ESBL NEGATIVE Sensitive     * 50,000 COLONIES/mL KLEBSIELLA PNEUMONIAE  MRSA PCR Screening     Status: None   Collection Time: 06/18/16  1:44 AM  Result Value Ref Range Status   MRSA by PCR NEGATIVE NEGATIVE Final    Comment:        The GeneXpert MRSA Assay (FDA approved for NASAL specimens only), is one component of a comprehensive MRSA colonization surveillance program. It is not intended to diagnose  MRSA infection nor to guide or monitor treatment for MRSA infections.   Urine culture     Status: Abnormal   Collection Time: 06/18/16 10:45 AM  Result Value Ref Range Status   Specimen Description URINE, CLEAN CATCH  Final   Special Requests NONE  Final   Culture >=100,000 COLONIES/mL KLEBSIELLA PNEUMONIAE (A)  Final   Report Status 06/21/2016 FINAL  Final   Organism ID, Bacteria KLEBSIELLA PNEUMONIAE (A)  Final      Susceptibility   Klebsiella pneumoniae - MIC*    AMPICILLIN >=32 RESISTANT Resistant     CEFAZOLIN <=4 SENSITIVE Sensitive     CEFTRIAXONE <=1 SENSITIVE Sensitive     CIPROFLOXACIN <=0.25 SENSITIVE Sensitive     GENTAMICIN <=1 SENSITIVE Sensitive     IMIPENEM <=0.25 SENSITIVE Sensitive     NITROFURANTOIN 32 SENSITIVE Sensitive     TRIMETH/SULFA <=20 SENSITIVE Sensitive     AMPICILLIN/SULBACTAM 4 SENSITIVE Sensitive     PIP/TAZO <=4 SENSITIVE Sensitive     Extended ESBL NEGATIVE Sensitive     * >=  100,000 COLONIES/mL KLEBSIELLA PNEUMONIAE  Culture, blood (routine x 2)     Status: None   Collection Time: 06/18/16  6:29 PM  Result Value Ref Range Status   Specimen Description BLOOD LEFT ANTECUBITAL  Final   Special Requests BOTTLES DRAWN AEROBIC AND ANAEROBIC 5CC  Final   Culture NO GROWTH 5 DAYS  Final   Report Status 06/23/2016 FINAL  Final  Culture, blood (routine x 2)     Status: None   Collection Time: 06/18/16  6:36 PM  Result Value Ref Range Status   Specimen Description BLOOD LEFT ARM  Final   Special Requests BOTTLES DRAWN AEROBIC AND ANAEROBIC 5CC  Final   Culture NO GROWTH 5 DAYS  Final   Report Status 06/23/2016 FINAL  Final     Labs: BNP (last 3 results) No results for input(s): BNP in the last 8760 hours. Basic Metabolic Panel:  Recent Labs Lab 06/22/16 0517 06/23/16 0449 06/24/16 0259 06/25/16 0345 06/26/16 0420  NA 140 140 142 143 144  K 5.6* 5.2* 4.0 3.9 4.0  CL 116* 113* 115* 115* 116*  CO2 15* 17* 20* 21* 22  GLUCOSE 70 80 114*  87 94  BUN 80* 76* 72* 60* 55*  CREATININE 4.70* 4.73* 4.58* 4.11* 3.92*  CALCIUM 8.3* 7.9* 7.6* 7.7* 7.7*  PHOS 3.3 3.8 3.8 3.5 3.3   Liver Function Tests:  Recent Labs Lab 06/22/16 0517 06/23/16 0449 06/24/16 0259 06/25/16 0345 06/26/16 0420  ALBUMIN 2.4* 2.1* 2.0* 2.0* 2.0*   No results for input(s): LIPASE, AMYLASE in the last 168 hours. No results for input(s): AMMONIA in the last 168 hours. CBC:  Recent Labs Lab 06/22/16 0509 06/23/16 0449 06/24/16 0259 06/25/16 0345 06/26/16 0420  WBC 8.7 7.0 6.3 8.3 8.4  NEUTROABS 6.8 4.9 4.3 5.7 6.0  HGB 9.9* 8.7* 8.7* 9.0* 8.9*  HCT 29.8* 26.5* 26.8* 27.9* 27.6*  MCV 95.5 94.3 95.7 95.2 95.5  PLT 80* 85* 73* 87* 109*   Cardiac Enzymes: No results for input(s): CKTOTAL, CKMB, CKMBINDEX, TROPONINI in the last 168 hours. BNP: Invalid input(s): POCBNP CBG:  Recent Labs Lab 06/20/16 0523  GLUCAP 86   D-Dimer No results for input(s): DDIMER in the last 72 hours. Hgb A1c No results for input(s): HGBA1C in the last 72 hours. Lipid Profile No results for input(s): CHOL, HDL, LDLCALC, TRIG, CHOLHDL, LDLDIRECT in the last 72 hours. Thyroid function studies No results for input(s): TSH, T4TOTAL, T3FREE, THYROIDAB in the last 72 hours.  Invalid input(s): FREET3 Anemia work up No results for input(s): VITAMINB12, FOLATE, FERRITIN, TIBC, IRON, RETICCTPCT in the last 72 hours. Urinalysis    Component Value Date/Time   COLORURINE YELLOW 06/21/2016 1612   APPEARANCEUR CLOUDY (A) 06/21/2016 1612   LABSPEC 1.015 06/21/2016 1612   PHURINE 5.0 06/21/2016 1612   GLUCOSEU NEGATIVE 06/21/2016 1612   GLUCOSEU NEGATIVE 10/25/2009 1134   HGBUR LARGE (A) 06/21/2016 1612   BILIRUBINUR NEGATIVE 06/21/2016 1612   KETONESUR NEGATIVE 06/21/2016 1612   PROTEINUR 100 (A) 06/21/2016 1612   UROBILINOGEN 0.2 04/16/2012 1810   NITRITE NEGATIVE 06/21/2016 1612   LEUKOCYTESUR SMALL (A) 06/21/2016 1612   Sepsis Labs Invalid input(s):  PROCALCITONIN,  WBC,  LACTICIDVEN Microbiology Recent Results (from the past 240 hour(s))  Urine culture     Status: Abnormal   Collection Time: 06/17/16  5:00 PM  Result Value Ref Range Status   Specimen Description URINE, RANDOM  Final   Special Requests NONE  Final   Culture 50,000  COLONIES/mL KLEBSIELLA PNEUMONIAE (A)  Final   Report Status 06/19/2016 FINAL  Final   Organism ID, Bacteria KLEBSIELLA PNEUMONIAE (A)  Final      Susceptibility   Klebsiella pneumoniae - MIC*    AMPICILLIN >=32 RESISTANT Resistant     CEFAZOLIN <=4 SENSITIVE Sensitive     CEFTRIAXONE <=1 SENSITIVE Sensitive     CIPROFLOXACIN <=0.25 SENSITIVE Sensitive     GENTAMICIN <=1 SENSITIVE Sensitive     IMIPENEM <=0.25 SENSITIVE Sensitive     NITROFURANTOIN 32 SENSITIVE Sensitive     TRIMETH/SULFA <=20 SENSITIVE Sensitive     AMPICILLIN/SULBACTAM 4 SENSITIVE Sensitive     PIP/TAZO <=4 SENSITIVE Sensitive     Extended ESBL NEGATIVE Sensitive     * 50,000 COLONIES/mL KLEBSIELLA PNEUMONIAE  MRSA PCR Screening     Status: None   Collection Time: 06/18/16  1:44 AM  Result Value Ref Range Status   MRSA by PCR NEGATIVE NEGATIVE Final    Comment:        The GeneXpert MRSA Assay (FDA approved for NASAL specimens only), is one component of a comprehensive MRSA colonization surveillance program. It is not intended to diagnose MRSA infection nor to guide or monitor treatment for MRSA infections.   Urine culture     Status: Abnormal   Collection Time: 06/18/16 10:45 AM  Result Value Ref Range Status   Specimen Description URINE, CLEAN CATCH  Final   Special Requests NONE  Final   Culture >=100,000 COLONIES/mL KLEBSIELLA PNEUMONIAE (A)  Final   Report Status 06/21/2016 FINAL  Final   Organism ID, Bacteria KLEBSIELLA PNEUMONIAE (A)  Final      Susceptibility   Klebsiella pneumoniae - MIC*    AMPICILLIN >=32 RESISTANT Resistant     CEFAZOLIN <=4 SENSITIVE Sensitive     CEFTRIAXONE <=1 SENSITIVE Sensitive      CIPROFLOXACIN <=0.25 SENSITIVE Sensitive     GENTAMICIN <=1 SENSITIVE Sensitive     IMIPENEM <=0.25 SENSITIVE Sensitive     NITROFURANTOIN 32 SENSITIVE Sensitive     TRIMETH/SULFA <=20 SENSITIVE Sensitive     AMPICILLIN/SULBACTAM 4 SENSITIVE Sensitive     PIP/TAZO <=4 SENSITIVE Sensitive     Extended ESBL NEGATIVE Sensitive     * >=100,000 COLONIES/mL KLEBSIELLA PNEUMONIAE  Culture, blood (routine x 2)     Status: None   Collection Time: 06/18/16  6:29 PM  Result Value Ref Range Status   Specimen Description BLOOD LEFT ANTECUBITAL  Final   Special Requests BOTTLES DRAWN AEROBIC AND ANAEROBIC 5CC  Final   Culture NO GROWTH 5 DAYS  Final   Report Status 06/23/2016 FINAL  Final  Culture, blood (routine x 2)     Status: None   Collection Time: 06/18/16  6:36 PM  Result Value Ref Range Status   Specimen Description BLOOD LEFT ARM  Final   Special Requests BOTTLES DRAWN AEROBIC AND ANAEROBIC 5CC  Final   Culture NO GROWTH 5 DAYS  Final   Report Status 06/23/2016 FINAL  Final     Time coordinating discharge: Over 30 minutes  SIGNED:  Dessa Phi, DO [facility] Pager [phone]  If 7PM-7AM, please contact night-coverage [email or web address] Password [email or web address] 06/26/2016, 1:18 PM

## 2016-06-26 NOTE — Progress Notes (Signed)
Subjective: Interval History: 1400 of UOP- creatinine down again - says walked in hall   Objective: Vital signs in last 24 hours: Temp:  [98 F (36.7 C)-98.2 F (36.8 C)] 98.2 F (36.8 C) (10/02 1203) Pulse Rate:  [69-80] 73 (10/02 1203) Resp:  [18-19] 18 (10/02 1203) BP: (119-147)/(57-79) 124/62 (10/02 1203) SpO2:  [97 %-99 %] 98 % (10/02 1203) Weight:  [78.4 kg (172 lb 14.4 oz)] 78.4 kg (172 lb 14.4 oz) (10/02 0534) Weight change: 1.724 kg (3 lb 12.8 oz)  Intake/Output from previous day: 10/01 0701 - 10/02 0700 In: 920 [P.O.:920] Out: 1450 [Urine:1450] Intake/Output this shift: Total I/O In: 320 [P.O.:320] Out: 520 [Urine:520]  General appearance: alert, cooperative and pale Resp: alert, cooperative and confused, pale Cardio: irregularly irregular rhythm and systolic murmur: holosystolic 2/6, blowing at apex GI: soft, non-tender; bowel sounds normal; no masses,  no organomegaly Extremities: edema 1-2+  Resp crackles in bases , decreased bs Lab Results:  Recent Labs  06/25/16 0345 06/26/16 0420  WBC 8.3 8.4  HGB 9.0* 8.9*  HCT 27.9* 27.6*  PLT 87* 109*   BMET:   Recent Labs  06/25/16 0345 06/26/16 0420  NA 143 144  K 3.9 4.0  CL 115* 116*  CO2 21* 22  GLUCOSE 87 94  BUN 60* 55*  CREATININE 4.11* 3.92*  CALCIUM 7.7* 7.7*   No results for input(s): PTH in the last 72 hours. Iron Studies: No results for input(s): IRON, TIBC, TRANSFERRIN, FERRITIN in the last 72 hours.  Studies/Results: No results found.  I have reviewed the patient's current medications.  Assessment/Plan: 1 AKI/CKD4  Vol a little better Cr  better, will cont conservative tx.  Not a dialysis candidate- family aware. Etiol Losartan and NSAIDS in setting of low bps- appears to be slowly resolving  2 BOO- foley and OP urology follow up  3 anemia given Fe 4 Dementia 5 afib agree with hold off coumadin 6 Prostate Ca 7 HTn not an issue 8 CHF stable P allow to diurese, cont foley, tx  conservatively  Dispo- renal function improving slowly- not a dialysis candidate.  No more ACE/ARB/NSAIDS.  Should continue to improve slowly.  There is no renal contraindication for discharge.  I would arrange follow up with PCP and urology- I do not see need for nephrology follow up at this time- conservative management.  Renal will sign off, call with questions    LOS: 9 days   Libbey Duce A 06/26/2016,12:24 PM

## 2016-06-26 NOTE — Progress Notes (Signed)
All d/c instructions explained and given to pt and his son at 88.  Verbalized understanding.  D/c off floor at 1511 to awaiting transport.  Karie Kirks, Therapist, sports.

## 2016-06-26 NOTE — Progress Notes (Signed)
Physical Therapy Treatment Patient Details Name: Ryan Hess MRN: [id number] DOB: [birth year] Today's Date: 06/26/2016    History of Present Illness Pt adm with GI bleed. PMH - afib, copd, htn, chf    PT Comments    Pt admitted with above diagnosis. Pt currently with functional limitations due to balance and endurance deficits. Pt was able to ambulate in hall with less assist than last session.  Pt still with poor safety awareness and will need assist at home for safety.  Continue PT.   Pt will benefit from skilled PT to increase their independence and safety with mobility to allow discharge to the venue listed below.    Follow Up Recommendations  Home health PT;Supervision for mobility/OOB     Equipment Recommendations  None recommended by PT    Recommendations for Other Services       Precautions / Restrictions Precautions Precautions: Fall Restrictions Weight Bearing Restrictions: No    Mobility  Bed Mobility Overal bed mobility: Needs Assistance Bed Mobility: Supine to Sit     Supine to sit: Min guard     General bed mobility comments: help to finish scooting to EOB  Transfers Overall transfer level: Needs assistance Equipment used: Rolling walker (2 wheeled);1 person hand held assist Transfers: Sit to/from Stand Sit to Stand: Min assist;Min guard         General transfer comment: Needed cues for hand placement.  Pt somewhat impulsive at times.     Ambulation/Gait Ambulation/Gait assistance: Min guard;Min assist Ambulation Distance (Feet): 250 Feet Assistive device: Rolling walker (2 wheeled) Gait Pattern/deviations: Step-through pattern;Decreased stride length;Trunk flexed;Wide base of support;Shuffle   Gait velocity interpretation: Below normal speed for age/gender General Gait Details: redirection of sequence and safety of the task as pt does keep trunk flexed and pt needs cues to stay close to RW.  Pt ambulates impulsively at times as well.     Stairs            Wheelchair Mobility    Modified Rankin (Stroke Patients Only)       Balance Overall balance assessment: Needs assistance;History of Falls Sitting-balance support: No upper extremity supported;Feet supported Sitting balance-Leahy Scale: Good     Standing balance support: Bilateral upper extremity supported;During functional activity Standing balance-Leahy Scale: Poor Standing balance comment: Pt unsteady at times needing min guard assist.              High level balance activites: Direction changes;Turns;Sudden stops High Level Balance Comments: Needs min guard asssit and cues as pt does not keep RW close enough.     Cognition Arousal/Alertness: Awake/alert Behavior During Therapy: Impulsive Overall Cognitive Status: Impaired/Different from baseline Area of Impairment: Attention;Following commands;Safety/judgement;Awareness   Current Attention Level: Sustained Memory: Decreased recall of precautions;Decreased short-term memory Following Commands: Follows one step commands consistently Safety/Judgement: Decreased awareness of safety Awareness: Intellectual Problem Solving: Requires verbal cues;Requires tactile cues      Exercises General Exercises - Lower Extremity Ankle Circles/Pumps: AROM;Both;10 reps;Supine Long Arc Quad: AROM;Both;10 reps;Seated    General Comments        Pertinent Vitals/Pain Pain Assessment: No/denies pain  VSS    Home Living                      Prior Function            PT Goals (current goals can now be found in the care plan section) Acute Rehab PT Goals Patient Stated Goal: return home with son  Progress towards PT goals: Progressing toward goals    Frequency    Min 3X/week      PT Plan Current plan remains appropriate    Co-evaluation             End of Session Equipment Utilized During Treatment: Gait belt Activity Tolerance: Patient tolerated treatment well Patient  left: in chair;with call bell/phone within reach;with chair alarm set     Time: 1044-1100 PT Time Calculation (min) (ACUTE ONLY): 16 min  Charges:  $Gait Training: 8-22 mins                    G CodesDenice Paradise 07/24/2016, 1:07 PM [facility] [facility] Acute Rehabilitation [phone] (pager)

## 2016-06-26 NOTE — Progress Notes (Signed)
PROGRESS NOTE    Ryan Hess  [id number] DOB: [birth year] DOA: 06/17/2016 PCP: Noralee Space, MD    Brief Narrative:  Ryan Hess is a 82 y.o. male with medical history significant of complex TAAA s/p EVAR at [facility], CKD stage 3, A.Fib on coumadin, HTN.  Patient presents to the ED with 3 weeks of intermittent melena that became consistent melena for the past 3 days or so.  No h/o N/V or abdominal pain.  No fever, no cough, no congestion.  Patient's family report that he is so weak he cannot walk which is why they brought him in.  Weakness is generalized without specific neurologic symptoms. He presented with hypotension, SBP in the 60s, AKI, UTI, melana, anemia, and supratherapeutic INR. He received vitamin K, pRBC transfusion, PPI. GI has seen patient and has signed off.   On 9/26, he developed an unresponsive episode while having Bm, possibly due to vagal response. MRI brain was ordered, which was negative for acute changes. Urology was consulted for mild hydro and recommended keep foley at time of discharge and outpatient follow up with urology.    Assessment & Plan:   Principal Problem:   GI bleed Active Problems:   Essential hypertension   Atrial fibrillation (HCC)   COPD (chronic obstructive pulmonary disease) with chronic bronchitis (HCC)   Acute blood loss anemia   Chronic systolic congestive heart failure (HCC)   Acute kidney failure (HCC)   Melena   UTI (lower urinary tract infection)   Metabolic acidosis, NAG, bicarbonate losses   CKD (chronic kidney disease) stage 3, GFR 30-59 ml/min   Pressure injury of skin   Melena with acute blood loss anemia -S/p prbc transfusion x2 units -GI signed off  -Would favor holding coumadin for now  -Monitor CBC   AKI on CKD III -Cr on admission is 5.39, baseline around 2 -AKI likely due to uti/dehydration/gi bleed/obstruction  -Renal US on 9/24 new mild left sided hydronephrosis, new since ct ab on 9/23, thick walled bladder  with debris, foley placed, urology consulted, per urology will keep foley in at discharge and outpatient follow up with urology. -Patient/family should call [facility] on discharge to schedule follow up appointment with Dr. Jeffie Pollock or partner.  -No angiotensin active agents -Monitor BMP  -Appreciate Nephrology   Klebsiella UTI, recurrent, unknown if present on admission -Switch to keflex, for total 10 day course (10/2 last day)   Metabolic acidosis  -Was on bicarb drip, changed to oral supplement -Appreciate Nephrology consult  -Improving   Chronic afib -Rate controlled, betablocker held initially due to hypotension -Lopressor restarted  -On coumadin at home which is held since admission due to gi bleed  Chronic systolic chf -Last EF in 123456 45-50% -Ordered ted hose   COPD -Stable  -Continue home meds  Dementia -Not oriented to time or place,very supportive family in room 24/7 for assistance  Thrombocytopenia -Monitor trend. No exposure to heparin product, no physical signs to suggest DVT. -Stable  Bilateral pleural effusion -IVF discontinued  HLD -Lipitor   Hypothyroidism -Synthroid   AAA s/p endoscopic repair -7cm , unchanged   DVT prophylaxis: SCDs Code Status: DNR Family Communication: no family at bedside, attempted to call Coralyn Mark without answer Disposition Plan: Continue to monitor kidney function. Patient with improvement today.  Pt: Home health PT    Consultants:   [facility]  Urology   Nephrology   Procedures:   pRBC transfusion x2units on 9/23-24night  Antimicrobials:   Rocephin from  9/23 to 9/26  Cefazolin from 9/26 >>> 10/1  Keflex 10/1 - 10/2    Subjective: Patient doing well this morning. Sitting in chair. He has no specific complaints and states that he is eating well. No chest pain or shortness of breath.  Objective: Vitals:   06/26/16 0100 06/26/16 0534 06/26/16 0829 06/26/16 1100  BP: (!) 147/79  132/75  (!) 147/79  Pulse: 69 78 80 69  Resp:  19 19   Temp:  98.1 F (36.7 C)    TempSrc:  Oral    SpO2:  99% 98%   Weight:  78.4 kg (172 lb 14.4 oz)    Height:        Intake/Output Summary (Last 24 hours) at 06/26/16 1148 Last data filed at 06/26/16 0906  Gross per 24 hour  Intake             1000 ml  Output             1370 ml  Net             -370 ml   Filed Weights   06/24/16 0642 06/25/16 0433 06/26/16 0534  Weight: 77.4 kg (170 lb 11.2 oz) 76.7 kg (169 lb 1.6 oz) 78.4 kg (172 lb 14.4 oz)    Examination:  General exam: Appears calm and comfortable Respiratory system: Clear to auscultation. Respiratory effort normal. Cardiovascular system: S1 & S2 heard, irreg irreg. No JVD, murmurs, rubs, gallops or clicks. +1 pedal edema Gastrointestinal system: Abdomen is nondistended, soft and nontender. No organomegaly or masses felt. Normal bowel sounds heard. Central nervous system: Alert Extremities: Moves all extremities spontaneously  Data Reviewed: I have personally reviewed following labs and imaging studies  CBC:  Recent Labs Lab 06/22/16 0509 06/23/16 0449 06/24/16 0259 06/25/16 0345 06/26/16 0420  WBC 8.7 7.0 6.3 8.3 8.4  NEUTROABS 6.8 4.9 4.3 5.7 6.0  HGB 9.9* 8.7* 8.7* 9.0* 8.9*  HCT 29.8* 26.5* 26.8* 27.9* 27.6*  MCV 95.5 94.3 95.7 95.2 95.5  PLT 80* 85* 73* 87* 0000000*   Basic Metabolic Panel:  Recent Labs Lab 06/22/16 0517 06/23/16 0449 06/24/16 0259 06/25/16 0345 06/26/16 0420  NA 140 140 142 143 144  K 5.6* 5.2* 4.0 3.9 4.0  CL 116* 113* 115* 115* 116*  CO2 15* 17* 20* 21* 22  GLUCOSE 70 80 114* 87 94  BUN 80* 76* 72* 60* 55*  CREATININE 4.70* 4.73* 4.58* 4.11* 3.92*  CALCIUM 8.3* 7.9* 7.6* 7.7* 7.7*  PHOS 3.3 3.8 3.8 3.5 3.3   GFR: Estimated Creatinine Clearance: 12.1 mL/min (by C-G formula based on SCr of 3.92 mg/dL (H)). Liver Function Tests:  Recent Labs Lab 06/22/16 0517 06/23/16 0449 06/24/16 0259 06/25/16 0345 06/26/16 0420    ALBUMIN 2.4* 2.1* 2.0* 2.0* 2.0*   No results for input(s): LIPASE, AMYLASE in the last 168 hours. No results for input(s): AMMONIA in the last 168 hours. Coagulation Profile:  Recent Labs Lab 06/20/16 0241 06/21/16 0400 06/22/16 0509  INR 1.39 1.41 1.48   Cardiac Enzymes: No results for input(s): CKTOTAL, CKMB, CKMBINDEX, TROPONINI in the last 168 hours. BNP (last 3 results)  Recent Labs  12/14/15 1219  PROBNP 462.0*   HbA1C: No results for input(s): HGBA1C in the last 72 hours. CBG:  Recent Labs Lab 06/20/16 0523  GLUCAP 86   Lipid Profile: No results for input(s): CHOL, HDL, LDLCALC, TRIG, CHOLHDL, LDLDIRECT in the last 72 hours. Thyroid Function Tests: No results for input(s): TSH, T4TOTAL, FREET4,  T3FREE, THYROIDAB in the last 72 hours. Anemia Panel: No results for input(s): VITAMINB12, FOLATE, FERRITIN, TIBC, IRON, RETICCTPCT in the last 72 hours. Sepsis Labs:  Recent Labs Lab 06/20/16 0241  LATICACIDVEN 1.3    Recent Results (from the past 240 hour(s))  Urine culture     Status: Abnormal   Collection Time: 06/17/16  5:00 PM  Result Value Ref Range Status   Specimen Description URINE, RANDOM  Final   Special Requests NONE  Final   Culture 50,000 COLONIES/mL KLEBSIELLA PNEUMONIAE (A)  Final   Report Status 06/19/2016 FINAL  Final   Organism ID, Bacteria KLEBSIELLA PNEUMONIAE (A)  Final      Susceptibility   Klebsiella pneumoniae - MIC*    AMPICILLIN >=32 RESISTANT Resistant     CEFAZOLIN <=4 SENSITIVE Sensitive     CEFTRIAXONE <=1 SENSITIVE Sensitive     CIPROFLOXACIN <=0.25 SENSITIVE Sensitive     GENTAMICIN <=1 SENSITIVE Sensitive     IMIPENEM <=0.25 SENSITIVE Sensitive     NITROFURANTOIN 32 SENSITIVE Sensitive     TRIMETH/SULFA <=20 SENSITIVE Sensitive     AMPICILLIN/SULBACTAM 4 SENSITIVE Sensitive     PIP/TAZO <=4 SENSITIVE Sensitive     Extended ESBL NEGATIVE Sensitive     * 50,000 COLONIES/mL KLEBSIELLA PNEUMONIAE  MRSA PCR Screening      Status: None   Collection Time: 06/18/16  1:44 AM  Result Value Ref Range Status   MRSA by PCR NEGATIVE NEGATIVE Final    Comment:        The GeneXpert MRSA Assay (FDA approved for NASAL specimens only), is one component of a comprehensive MRSA colonization surveillance program. It is not intended to diagnose MRSA infection nor to guide or monitor treatment for MRSA infections.   Urine culture     Status: Abnormal   Collection Time: 06/18/16 10:45 AM  Result Value Ref Range Status   Specimen Description URINE, CLEAN CATCH  Final   Special Requests NONE  Final   Culture >=100,000 COLONIES/mL KLEBSIELLA PNEUMONIAE (A)  Final   Report Status 06/21/2016 FINAL  Final   Organism ID, Bacteria KLEBSIELLA PNEUMONIAE (A)  Final      Susceptibility   Klebsiella pneumoniae - MIC*    AMPICILLIN >=32 RESISTANT Resistant     CEFAZOLIN <=4 SENSITIVE Sensitive     CEFTRIAXONE <=1 SENSITIVE Sensitive     CIPROFLOXACIN <=0.25 SENSITIVE Sensitive     GENTAMICIN <=1 SENSITIVE Sensitive     IMIPENEM <=0.25 SENSITIVE Sensitive     NITROFURANTOIN 32 SENSITIVE Sensitive     TRIMETH/SULFA <=20 SENSITIVE Sensitive     AMPICILLIN/SULBACTAM 4 SENSITIVE Sensitive     PIP/TAZO <=4 SENSITIVE Sensitive     Extended ESBL NEGATIVE Sensitive     * >=100,000 COLONIES/mL KLEBSIELLA PNEUMONIAE  Culture, blood (routine x 2)     Status: None   Collection Time: 06/18/16  6:29 PM  Result Value Ref Range Status   Specimen Description BLOOD LEFT ANTECUBITAL  Final   Special Requests BOTTLES DRAWN AEROBIC AND ANAEROBIC 5CC  Final   Culture NO GROWTH 5 DAYS  Final   Report Status 06/23/2016 FINAL  Final  Culture, blood (routine x 2)     Status: None   Collection Time: 06/18/16  6:36 PM  Result Value Ref Range Status   Specimen Description BLOOD LEFT ARM  Final   Special Requests BOTTLES DRAWN AEROBIC AND ANAEROBIC 5CC  Final   Culture NO GROWTH 5 DAYS  Final   Report Status 06/23/2016  FINAL  Final          Radiology Studies: No results found.      Scheduled Meds: . atorvastatin  10 mg Oral q1800  . cephALEXin  500 mg Oral Q12H  . docusate sodium  100 mg Oral BID  . famotidine  20 mg Oral Daily  . fluticasone furoate-vilanterol  1 puff Inhalation Daily  . levothyroxine  50 mcg Oral QAC breakfast  . mouth rinse  15 mL Mouth Rinse BID  . metoprolol tartrate  12.5 mg Oral BID  . polyethylene glycol  17 g Oral Daily  . sodium bicarbonate  1,300 mg Oral TID  . tiotropium  18 mcg Inhalation Daily   Continuous Infusions:    LOS: 9 days    Time spent: 30 minutes    Dessa Phi, DO [facility] Pager [phone]  If 7PM-7AM, please contact night-coverage [email or web address] Password [email or web address] 06/26/2016, 11:48 AM

## 2016-06-26 NOTE — Progress Notes (Signed)
Order received from Dr. Maylene Roes to d/c pt home with foley.  Karie Kirks, Therapist, sports.

## 2016-06-28 ENCOUNTER — Other Ambulatory Visit: Payer: Self-pay | Admitting: Pulmonary Disease

## 2016-06-29 ENCOUNTER — Encounter: Payer: Self-pay | Admitting: Pulmonary Disease

## 2016-06-29 ENCOUNTER — Other Ambulatory Visit (INDEPENDENT_AMBULATORY_CARE_PROVIDER_SITE_OTHER): Payer: Medicare Other

## 2016-06-29 ENCOUNTER — Ambulatory Visit (INDEPENDENT_AMBULATORY_CARE_PROVIDER_SITE_OTHER): Payer: Medicare Other | Admitting: Pulmonary Disease

## 2016-06-29 VITALS — BP 124/88 | HR 66 | Temp 97.0°F | Wt 174.0 lb

## 2016-06-29 DIAGNOSIS — E440 Moderate protein-calorie malnutrition: Secondary | ICD-10-CM

## 2016-06-29 DIAGNOSIS — I10 Essential (primary) hypertension: Secondary | ICD-10-CM | POA: Diagnosis not present

## 2016-06-29 DIAGNOSIS — N183 Chronic kidney disease, stage 3 unspecified: Secondary | ICD-10-CM

## 2016-06-29 DIAGNOSIS — K921 Melena: Secondary | ICD-10-CM

## 2016-06-29 DIAGNOSIS — J449 Chronic obstructive pulmonary disease, unspecified: Secondary | ICD-10-CM

## 2016-06-29 DIAGNOSIS — M150 Primary generalized (osteo)arthritis: Secondary | ICD-10-CM

## 2016-06-29 DIAGNOSIS — I714 Abdominal aortic aneurysm, without rupture, unspecified: Secondary | ICD-10-CM

## 2016-06-29 DIAGNOSIS — I4819 Other persistent atrial fibrillation: Secondary | ICD-10-CM

## 2016-06-29 DIAGNOSIS — J4489 Other specified chronic obstructive pulmonary disease: Secondary | ICD-10-CM

## 2016-06-29 DIAGNOSIS — M159 Polyosteoarthritis, unspecified: Secondary | ICD-10-CM

## 2016-06-29 DIAGNOSIS — R339 Retention of urine, unspecified: Secondary | ICD-10-CM

## 2016-06-29 DIAGNOSIS — R413 Other amnesia: Secondary | ICD-10-CM

## 2016-06-29 DIAGNOSIS — I5022 Chronic systolic (congestive) heart failure: Secondary | ICD-10-CM

## 2016-06-29 DIAGNOSIS — E46 Unspecified protein-calorie malnutrition: Secondary | ICD-10-CM | POA: Insufficient documentation

## 2016-06-29 DIAGNOSIS — I481 Persistent atrial fibrillation: Secondary | ICD-10-CM | POA: Diagnosis not present

## 2016-06-29 DIAGNOSIS — Z95828 Presence of other vascular implants and grafts: Secondary | ICD-10-CM

## 2016-06-29 DIAGNOSIS — E039 Hypothyroidism, unspecified: Secondary | ICD-10-CM

## 2016-06-29 DIAGNOSIS — I739 Peripheral vascular disease, unspecified: Secondary | ICD-10-CM

## 2016-06-29 DIAGNOSIS — D62 Acute posthemorrhagic anemia: Secondary | ICD-10-CM

## 2016-06-29 LAB — CBC WITH DIFFERENTIAL/PLATELET
Basophils Absolute: 0.1 10*3/uL (ref 0.0–0.1)
Basophils Relative: 0.7 % (ref 0.0–3.0)
Eosinophils Absolute: 0.2 10*3/uL (ref 0.0–0.7)
Eosinophils Relative: 1.8 % (ref 0.0–5.0)
HCT: 31.8 % — ABNORMAL LOW (ref 39.0–52.0)
Hemoglobin: 10.5 g/dL — ABNORMAL LOW (ref 13.0–17.0)
Lymphocytes Relative: 9.5 % — ABNORMAL LOW (ref 12.0–46.0)
Lymphs Abs: 1 10*3/uL (ref 0.7–4.0)
MCHC: 32.9 g/dL (ref 30.0–36.0)
MCV: 95.6 fl (ref 78.0–100.0)
Monocytes Absolute: 0.8 10*3/uL (ref 0.1–1.0)
Monocytes Relative: 7.6 % (ref 3.0–12.0)
Neutro Abs: 8.7 10*3/uL — ABNORMAL HIGH (ref 1.4–7.7)
Neutrophils Relative %: 80.4 % — ABNORMAL HIGH (ref 43.0–77.0)
Platelets: 216 10*3/uL (ref 150.0–400.0)
RBC: 3.33 Mil/uL — ABNORMAL LOW (ref 4.22–5.81)
RDW: 15.4 % (ref 11.5–15.5)
WBC: 10.8 10*3/uL — ABNORMAL HIGH (ref 4.0–10.5)

## 2016-06-29 MED ORDER — QUETIAPINE FUMARATE 25 MG PO TABS: 25.0000 mg | ORAL_TABLET | Freq: Every day | ORAL | 0 refills | Status: DC

## 2016-06-29 NOTE — Progress Notes (Signed)
Subjective:     Patient ID: Ryan Hess, male   DOB: Apr 16, 1924, 82 y.o.   MRN: [id number]  HPI 82 y/o WM here for a follow up visit... he has multiple medical problems as noted below...   ~  SEE PREV EPIC NOTES FOR OLDER DATA >>     5/14:  Ryan Hess is awaiting a call from DrFarber at [facility] about the next stage in his planned thoracoabd aneurysm repair- he had his situation reviewed by DrBrabham here & he rec f/u at [facility] per the original plan since this needs to be done endovascularly & is quite complicated...   LABS 5/14:  Chems- ok w/ Hess mild RI (Cr=1.5);  CBC- wnl w/ Hg=14.2;  TSH=6.21 (Synthroid50 started);  BNP=263;  Protimes adjusted by the CC...   Ryan Hess has had his f/u surg at [facility] by [employer] w/ 5 stents placed 04/15/13 in a >4h operation w/ special spinal precautions; he developed a blood clot in his left arm w/ some persist swelling...  CXR 10/14 showed chr changes of Ao stent graft, Hess cardiomegaly, no infiltrates, NAD...   LABS 10/14:  CBC- wnl;  Protimes followed in CC...    LABS 5/15:  Chems- ok x BUN=26 Cr=1.6;  CBC- wnl w/ Hg=13.2;  TSH=1.92 on Levothy50;  BNP=184...   LABS 7/15:  Chems- ok x BUN=33 Cr=2.0;  CBC- ok w/ Hg=12.2 WBC=6.9;  Protime=49sec, INR=4.8;  Sed=37  D-Dimer=2.50  CRP=4.4  VenDopplers 7/15 showed NEG for DVT...   ~  December 14, 2014:  67moROV & [name] a follow up [facility] and [facility] [place] [place] [place]> see details below but everything Hess and no med changes required:  CXR 1/16 revealed Hess thoracoabdominal aortic endograft- no change, Hess heart size, COPD/Emphysema & mild right basilar atelectasis, NAD...  Abd XRay 1/16 showed 4-vessel fenestrated thoracoabdominal aortic endograft w/ branch stents in celiac/ SMA/ & bilat renal arts- all unchanged, extensive DDD in spine, L4 compression- all unchanged...   Renal/ Mesenteric Ultrasound 1/16> no evid of hemodynamically signif RAS  noted bilat, right renal cyst, distal SMA stent/ mid & distal SMA were normal, celiac & prox SMA stent not visualized...   CT Chest/ Abd/ Pelvis 1/16 w/o contrast> Hess to sl decr in size of the mid thoracic Ao aneurysm, Hess appearance of the 4-vessel fenestrated thoracoabdominal endograft; thickening & trabeculation of the bladder wall & prox ascending bowel wall thickening... NOTE> they rec f/u w/ Urology & GI in light of these findings... Ryan Hess [name] he states he is feeling well, now 82y/o, and denies CP, palpit, dizzy, change is dyspnea, & no edema; he is able to walk to the mailbox slowly & does ok- this is Hess; he notes min cough/ beige sput/ no hemoptysis or f/c/s; he notes that he is a little more forgetful & son confirms some progressive decline in memory- they are ready to try [name] Rx (start w/ 521md 7 incr to 1058m)...     COPD, HxPneumonia> on Advair250Bid & Spiriva daily, plus Mucinex600Bid; denies resp exac- continue same, avoid infections...    Cards- HBP, CHF, AFib> on Coumadin via CC, ASA81, Losar100, Lasix40; BP= 122/60 & they note that DrFarber doesn't want it too low due to his atherosclerosis & the endograft; he is overdue for Cards follow up visit...     Peripheral Vasc Dis> see above...    Hyperlipidemia> on Atorva10; FLP today  showed TChol 133, TG 82, HDL 43, LDL 74... continue same Rx...    Hypothyroid> on Synthroid50; clinically euthyroid & Labs 3/16 showed TSH= 2.82...    GI- HxDivertics, Polyps, ischemic bowel> see above; CT Abd&Pelvis w/o contrast 1/16 at [facility] w/ focal thickening in bowel wall in Prox asc colon; he saw DrDBrodie last 2012 & they decided to forgo any f/y colonoscopies for the obvious reasons; we will follow clinically- watch blood counts and stools, DrFarber plans f/u CT scans in another 93mo..     GU- prostate cancer and RI> he is followed by DrWrenn for Urology & seen several weeks ago by his hx (we do  not have notes from them); pt reports everything was good & I will [facility] & [facility] to Urologist's attn...    DJD> Ryan Hess, s/p bilat TKRs, on Tramadol & tylenol prn...    Anemia> Labs today showed Hg= 14.4, MCV= 94, all back to wnl... We reviewed prob list, meds, xrays and labs> see below for updates >>   LABS 3/16:  FLP- at goals on Atorva10;  Chems- ok w/ Cr improved to 1.85;  CBC- wnl;  TSH=2.82 on Synthroid50... PLAN>>  We decided to start [name] 5=>171md for his vascular dementia & memory loss;  He will f/u w/ Cards at his earliest convenience; we will send [facility] & scan report to DrWrenn, Urology to review...   ~  June 16, 2015:  28m29moV & Ryan Hess that he is doing well- feeling good, min cough, no sput, no blood, Hess SOB/DOE w/o change; he ambulates w/ a cane & needs more exercise, we discussed this- not having any CP, palpit, edema...  [facility]-- no changes made & they are continuing 28mo 35mo; they did rec f/u by Urology due to CT scan findings and he has an upcoming appt w/ DrWrenn...   CXR 8/16> similar appearance to the thoracoabd endograft, lungs appear unchanged...   Abd film 8/16> similar appearance of the endograft w/o kink/ stenosis/ or other complic evident; degen changes in the spine...  Renal/ Mesenteric Ultrasound 8/16> no evid of renal art stenosis bilat w/ patent stents; norm hepatic & mesenteric art findings w/ patent SMA stent  CTA Chest/ Abd/ Pelvis 8/16> no change in appearance of the Aortic fenestrated endograft spaning the desc aorta to the infrarenal AA w/ stent limbs to the celiac art, SMA, bilat renal arts; 2 areas of aneurysmal dilatation- mid-thor sac measures 5.8cm (prev 5.5cm), & upper abd aneurysm sac measures 7.2cm (prev 7.5cm); apical scarring & severe centrilob & paraseptal emphysema, 1cm RUL nodule unchanged, no adenopathy; right renal cyst & irreg enhancing  post bladder wall thickening which is new; radiotherapy seeds in prostate; esoph is patulous w/ A/F level distally; DJD & L4 compression w/o change... COPY TO PT TO SHOW TO Dr. WrennJeffie PollockLABS at [facility]:  BUN=31, Cr=1.87 EXAM shows Afeb, VSS, O2sat=97% on RA;  HEENT- neg, mallampati1;  Chest- sl decr BS at bases w/ scat rhonchi, no w/r/consolidation;  Heart- irreg AFib, Gr1/6SEM w/o r/g;  Abd- soft, nontender;  Ext- w/o c/c/e;  Neuro- intact...    COPD, HxPneumonia> on Advair250Bid & Spiriva daily, plus Mucinex600Bid; denies resp exac- continue same, avoid infections...    Cards- HBP, CHF, AFib> on Coumadin via CC, ASA81, Losar50, Lasix40; BP= 116/50 & they note that DrFarber doesn't want it too low due to his atherosclerosis & the endograft;  he is overdue for Cards follow up visit...     Peripheral Vasc Dis> [facility]...    Hyperlipidemia> on Atorva10; FLP 3/16 showed TChol 133, TG 82, HDL 43, LDL 74... continue same Rx...    Hypothyroid> on Synthroid50; clinically euthyroid & Labs 3/16 showed TSH= 2.82...    GI- HxDivertics, Polyps, ischemic bowel> see above; CT[facility]Pelvis 8/16 at [facility] looked ok; he saw DrDBrodie last 2012 & they decided to forgo any f/u colonoscopies; we will follow clinically- watch blood counts and stools...    GU- prostate cancer and RI> he is followed by DrWrenn for Urology & he has up coming appt to address the abn finding on CTA Abd/Pelvis w/ post bladder wall thickening...    DJD> Ryan Hess appears to be Hess, s/p bilat TKRs, on Tramadol & Tylenol prn...    Anemia> Labs 3/16 showed Hg= 14.4, MCV= 94, all back to wnl...    Memory Loss> we had prev started [name]5=>10 but pt has stopped this med & they do not want to pursue this eval or Rx... IMP/PLAN>>  We sent a copy of his CT Chest/ Abd/ Pelvis from [facility] Everywhere report) with the pt to give to DrWrenn; he will continue current meds and incr exercise; overdue for Cards f/u & they will call; Given  2016 FLU shot today...  ~  December 14, 2015:  62moROV & Ryan Hess reports that he is doing OK overall- he has developed a small skin ulser over his sacrum 7 we discussed avoiding pressure on this area (use donut), wash/ gauze/ vaseline or desitin/ etc, they will call if not resolving... He reports breathing is good- sl cough, sm amt yellow sput, no f/c/s, no CP & Hess SOB/DOE (no change...  EXAM shows Afeb, VSS, O2sat=97% on RA;  HEENT- neg, mallampati1;  Chest- sl decr BS at bases w/ scat rhonchi, no w/r/consolidation;  Heart- irreg AFib, Gr1/6SEM w/o r/g;  Abd- soft, nontender;  Ext- w/o c/c/e;  Neuro- intact...    COPD, HxPneumonia> on Advair250Bid & Spiriva daily, plus Mucinex600Bid; denies resp exac- continue same, avoid infections...    Cards- HBP, CHF, AFib> on Coumadin via CC, ASA81, Aten25, Losar25, Lasix40; BP= 112/70 & they note that DrFarber doesn't want it too low due to his atherosclerosis & the endograft; he saw DrNishan 11/15/15-- HBP, AAA repair, [facility] on cou[facility]- he restarted Aten25 for nodal blockade..    Peripheral Vasc Dis> [facility]last seen in Care Everywhere 04/2015 w/ CTA chest/abd/pelvis- note reviewed)...    Hyperlipidemia> on Atorva10; FLP 3/17 showed TChol 120, TG 98, HDL 36, LDL 65... continue same Rx...    Hypothyroid> on Synthroid50; clinically euthyroid but Labs 3/17 showed TSH= 6.98 so we rec incr Synthroid 7173m/d...    GI- HxDivertics, Polyps, ischemic bowel> see above; [facility] he saw DrDBrodie last 2012 & they decided to forgo any f/u colonoscopies; we will follow clinically- watch blood counts and stools...    GU- prostate cancer, RI, chr cystitis> he is followed by DrWrenn for Urology, abn finding on CTA Abd/Pelvis w/ post bladder wall thickening, last seen 10/22/15- cysto 06/2015 w/ inflamm in bladder but no mass, cyto neg, he's had hematuria & prev on 73m71moppressive Keflex for chr cystitis,  they are following closely...    DJD> GeoAbhinavpears to be Hess, s/p bilat TKRs, LBP, etc; on Tramadol & Tylenol prn...Marland KitchenMarland Kitchen  Anemia> Labs 3/17 showed Hg= 14.6, MCV= 94, all back to wnl...    Memory Loss> we had prev started [name]5=>10 but pt has stopped this med & they do not want to pursue this eval or Rx...  EKG 11/15/15>  Chr AFib, rate114, LAD, PVC, NSSTTWA...  CXR 12/14/15>  Norm heart size, extensive Ao stent graft in place, coarse interstitial markings bilat- NAD= no infiltrate/ masses/ fluid/ etc...  LABS 12/14/15>  FLP-  At goals on Lip10;  Chems- ok x BUN=32, Cr=2.00, AlkPhos=235 & GGT=119 (7-51);  BNP=462 on Lasix40;  CBC- wnl w/ Hg=14.6, wbc=11.6;  TSH=6.98 IMP/PLAN>>  Ryan Hess is 83 y/o 7 remarkably Hess for all he's been through- rec to continue current meds, continue regular follow up visits, avoid infections, stay active, & he will call for any problems in the interval...  ~  June 29, 2016:  71moROV & post hosp check>  [facility] due to melena, weakness, hypotension, AKI w/ BUN=141, Cr=5.39 (from his baseline of 32/2.0) , UTI w/ sens Klebs, and anemic w/ Hg=8.1 (from baseline~14), mcv=92, Fe=11 (7%sat), stool heme pos & his INR was 6.7 (Coumadin held by CC several days prior to adm);  Rx w/ VitK, 2uPCs, PPI, and Coumadin held;  GI consulted- they decided against endoscopic studies or further testing;  He had an unresponsive episode during the hosp (occured after a bowel movement & felt to be vagally mediated)- MRI Brain was NEG for acute abn;  Urology was consulted for mild hydronephrosis, hx prostate Ca w/ prev XRT 2009 & PSA recurrence (PSA=2.8 [id number] nadir <0.04)- foley placed & will be managed as outpt;  Nephrology also consulted- prev Losartan & Lasix were held; Klebs UTI treated w/ Cephalosporin x10d;  AKI (hypotension, GIB, meds, mild hydro) and CKD4- not a dialysis cand, he was treated w/ IVF, bicarb, may need Lasix restarted as outpt (no ACE,  ARB, NSAIDs)...    Since disch he remains weak, doesn't want to eat or do anything according to his son, agitated & confused Qhs & sun-downing;  Foley in- draining clear urine;  They have home health nurse checking on Pt... We decided to try a low dose SEROQUEL 282m1/2 to 1 Qhs for the sun-downing, agitation, etc & it worked well the first night, son will assess need daily... we reviewed & updated the following medical problems during today's office visit >>     COPD, HxPneumonia> on Advair250Bid & Spiriva daily, plus Mucinex600Bid; denies resp exac- continue same meds, avoid infections...    Cards- HBP, CHF, AFib> Hosp 05/2016 w/ INR=6.7, GIB, & AKI- Coumadin held/ reveresed, off Lasix, on MetopER25; BP= 124/88 & they note that DrFarber doesn't want BP too low due to his atherosclerosis & the endograft; he saw DrNishan 12/2015-- HBP on Aten25, AAA repair[facility] reviewed, no change in meds...    Peripheral Vasc Dis> [facility] Every[facility]/2015 w/ CTA chest/abd/pelvis- note reviewed) & they are to contact pt soon regarding future appts...    Hyperlipidemia> on Atorva10; FLP 3/17 showed TChol 120, TG 98, HDL 36, LDL 65... continue same Rx...    Hypothyroid> on Synthroid50; clinically euthyroid but Labs 3/17 showed TSH= 6.98 & pill count was way off, son to control his meds, still on 5057md w/ TSH 05/2016 = 5.31, continue same for now...    GI- HxDivertics, Polyps, hx ischemic bowel, GIB 05/2016  when PT/INR=6.7> see above; CTA Abd&Pelvis 8/16 at [facility] looked ok; he saw DrDBrodie last 2012 & they decided to forgo any f/u colonoscopies; GIB 05/2016 when INR=6.7 Hosp by [facility] & seen by DrSchooler, no interventions- Coumadin held, reveresed, transfused, etc; no abd pain, n/v, had mod stool burden, melena resolved...    GU- prostate cancer, chr cystitis, CKD4> followed by DrWrenn for Urology, abn finding on CTA Abd/Pelvis w/ post bladder  wall thickening, last seen 10/22/15- cysto 06/2015 w/ inflamm in bladder but no mass, cyto neg, he's had hematuria & prev on 58mosuppressive Keflex for chr cystitis;  ADM 05/2016 w/ GIB, INR=6.7, AKI w/ Cr=5.39, mild left hydro- Foley, IVF & Tx, etc & improved to Cr=4.39; followed by Urology & Neph DrPatel/Deterding not a dialysis cand.    DJD> GYeshuaappears to be Hess, s/p bilat TKRs, LBP, etc; on Tramadol & Tylenol prn...    Anemia> Labs 3/17 showed Hg= 14.6, MCV= 94; then 05/2016 ADM w/ GIB & INR=6.7, Hg was 8.1 w/ Fe=11 (7%), transfused 2u & needs longer term FeSO4...    Memory Loss, MRI w/ vasc dis, old infarcts, atrophy> we had prev started [name]5=>10 but pt has stopped this med & they do not want to pursue this eval or Rx... EXAM shows Afeb, VSS w/ BP=124/88, O2sat=100% on RA;  HEENT- neg, mallampati1;  Chest- sl decr BS at bases w/ scat rhonchi, no w/r/consolidation;  Heart- irreg AFib, Gr1/6SEM w/o r/g;  Abd- soft, nontender;  Ext- w/o c/c/e;  Neuro- intact w/o focal deficits...   CT Abd&Pelvis 06/17/16>  Lung bases show emphysema & patchy subpleural reticulation, coronary atherosclerosis; no acute intra-abd abn- mild sigm divertics w/o -itis, mod stool burden; Ao atherosclerosis w/ complex thoracoabd aneurysm s/p Ao-bi-iliac stent graft (maxdiameter 7.0 cm w/ celiac/ SMA/ bilat renal art stents in place); nonobstructing renal stone, cysts, bladder wall thickening, hepatic steatosis, L4 compression...   Abd Sonar 06/22/16>  Signs medical renal dis bilat, right renal cyst, foley in bladder, no hydroneph present...  MRI Brain 06/21/16>  No acute ischemia or hemorrhage, mod to severe ventriculomegaly & global parenchymal vol loss, old left cerebellar infarcts, old right thalamic lacunar infarct, attenuated left vertebral & basilar arteries, small vessel dis & ectatic ICAs,   EKG 06/17/16>  Afib, LAD, low voltage limb leads, NSSTTWA...   LABS 05/2016 hosp> reviewed in Epic, pertinent numbers above in  prob list...  LABS 06/2016 via office>   IMP/PLAN>>  Prob List updated w/ recent GIB, supratherapeutic INR, anemia, AKI, etc as above;  For now has Foley & appt w/ DrWrenn for f/u;  We will recheck labs regarding CBC, renal function, & decision regarding restart of Coumadin for his Afib & vasc dis;  He will also need f/u w/ Nephrology & Cardiology, son indicates too difficult [facility]... we plan short term recheck in 1 week.           Problem List:  COPD (ICD-496) Hx of PNEUMONIA, ORGANISM UNSPECIFIED (ICD-486) PULMONARY NODULE & CAVITY RLL (ICD-518.89) - he is an ex-smoker, having [place]... he was exercising regularly and walking daily ~149m5-6 days per week... on ADGreenbackaily, +MUCINEX 2Bid w/ Fluids... ~  baseline CXR & CTChest w/ marked emphysema, atheromatous calcif, 5cm desc thorAA...  ~  CXR & CTA 2/11 in hosp showed cardiomeg + coronary calcif, advanced atherosclerosis of Ao w/ aneuryms 5.8cm into abd, underlying emphysema  w/ interst edema & bilat effusions, no PE... ~  4/11:  RLL pneumonia which was slow to clear... ~  6/11:  f/u CXR & CT Chest 6/11 w/ 6cm cavitary area RLL & nodular component inferiorly, +hilar adenopathy, no mediastinal nodes, etc; Tumor markers showed CEA=7.5 & Ca19-9= 10.8; > IR felt lesion was too hi risk for bx, therefore contin Rx & observe. ~  8/11:  f/u CXR w/o change in RLL opac (no worsening)>> repeat CEA=6.5; plan f/u CT Chest in 35mo ~  10/11: f/u CT Chest showed interval decrease in size of RLL cavitary lesion & the inferiorly placed soft tissue component; otherw there is severe emphysema, cardiomeg, coronary calcif, thoracoabd aneurysm w/o change; there was an air-fluid level in a dilated esoph> check Ba Esophagram= Nonspecific esophageal motility disorder with very poor primary esophageal contractions (no HH, stricture, etc)... ~  12/12: f/u CTChest showed severe emphysema &  scarring w/ bibasilar atx (no infiltrates no cavities), markedly tortuous & ectaticThorAo w/ extensive atherosclerotic changes & no change in decr thor aneurysm measuring 5.9 x 5.6cm, mild cardiomeg & dense coronary calcif as well... ~  4/13:  He had a fenestrated endovasc [facility]abdominal [facility] ~  subseq CXRs thru 7/13 hosp w/ cardiomeg, low lung vols, bibasilar airsp dis, etc... ~  CXR 1/14 showed cardiomeg, bibasilar fibrosis, thoracic aortic stent noted, NAD..[name] ~  7/14:  ?RUL pneumonia treated w/ Avelox & resolved=> [facility]  CXR 10/14 showed chr changes of Ao stent graft, Hess cardiomegaly, no infiltrates, NAD..[name] ~  5/15: on Advair250, Spiriva, Mucinex; last CT 12/12 w/ severe emphysema & scarring, plus his extensive atherosclerotic dis/ ectatic thorAo/ aneurysm/ etc; his breathing is Hess- mild cough, min sput, no hemoptysis, Hess DOE, etc. ~  11/15: Hess on same meds... ~  [facility] thoracoabdominal aortic end[facility]- no change, Hess heart size, COPD/Emphysema & mild right basilar atelectasis, NAD..Marland KitchenMarland Kitchen~  CT Chest ([facility]/ [facility]Pelvis[facility] sl decr in size of the mid thoracic Ao aneurysm, Hess appearance of the 4-vessel fenestrated thoracoabdominal endograft... ~  3/16: on Advair250Bid & Spiriva daily, plus Mucinex600Bid; denies resp exac; [facility] Hess & no changes made...    HYPERTENSION (ICD-401.9)                         << followed by DCherly Hensenfor Cards >> CONGESTIVE HEART FAILURE (ICD-428.0) ATRIAL FIBRILLATION (ICD-427.31) w/ clot in left atrial appendage >> resolved on f/u TEE, on COUMADIN per Cards & cardioverted. PREMATURE VENTRICULAR CONTRACTIONS, FREQUENT (ICD-427.69)  MEDS> prev on ATENOLOL 263md, & LASIX 2073mAM + KCl 44m45md, and off Losartan... prev Norvasc discontinued & hx ACE cough in the past on Lisinopril. ~   NuclearStressTest 1/05 was neg- no ischemia or infarct (+diaphrag attenuation), EF=56%... ~  repeat Nuclear study 6/09 was neg- no ischemia, mild inferoapic thinning, not gated due to PVCs... ~  2DEcho 6/09 showed mild dilated LV w/ EF= 45-50% but no regional wall motion abn, mild AoV calcif & AI, LA mild dil... ~  12/10:  changed from [place] to LOSAMowbray Mountainsave $$ ~  2/11:  in hosp- Cath= mild nonobstructive 3 vessel CAD, mod LVD w/ EF=40-45%; TEE= CHF w/ EF~45% w/ HK & left atrial appendage clot & AFib; he was diuresed & Norvasc stopped, placed on Coumadin w/ careful f/u by  DrNishan & the CC... ~  Subseq successful [facility] & holding NSR w/ PVCs... ~  Recurrent AFib w/ RVR after his Thoracoabdominal stent graft 4/13... ~  6/13:  BP was low assoc w/ GU bleeding & ATENOLOL25 & LOSARTAN50 placed on HOLD... ~  COUMADIN on HOLD since GU bleeding & acute blood loss anemia; it has not yet been restarted... ~  8/13:  BP= 116/78 on LASIX '40mg'$ /d & K10-Bid;  Wt down 14# to 173# ~  10/13:  BP= 114/82 on Lasix40+K1-Bid (he takes Aten25 if BP>110 at home)... ~  12/13: he had Cards f/u DrNishan> note reviewed, no change in meds, he referred him to DrGearhart for f/u stent as he did not want to [facility] to [facility]... ~  5/14: HBP, CHF, AFib w/ hx clot in LA appendage- resolved on Coumadin> on ASA81, Aten25, Lasix40, K10Bid; BP= 114/72 & he denies CP, palpit etc; followed by Cherly Hensen for Cards- his notes are reviewed, Hess- no changes made. ~  8/14: he is post op extensive vasc surg at [facility] w/ 5 stents placed by DrFarber... ~  5/15: on ASA81, Coumadin, Aten25 (if BP>110), Losartan100, Lasix40; BP= 120/64 & he denies CP, palpit etc; followed by Cherly Hensen for Cards- his notes are reviewed, Hess- BNP 5/15= 184... ~  7/15: on ASA81, Coumadin, Aten25 (if BP>110), Losartan100, Lasix40; BP= 110/60 & he presented w/ incr left leg swelling after fall; Renal function sl worse w/ Cr=2.0 & rec to decr Losartan100=>50 ~  11/15: on ASA81,  Coumadin, Losar50, Lasix40; BP= 130/72 & he denies CP, palpit etc; followed by Cherly Hensen for Cards- last seen 12/14, Hess- no changes made & he is due for f/u visit...Marland KitchenMarland KitchenMarland Kitchen  PERIPHERAL VASCULAR DISEASE (ICD-443.9) - back on ASA '81mg'$ /d...  ~  he is s/p infrarenal AAA repair w/ right common iliac aneurysm repair (via Ao Bi-iliac graft) in 1998 by DrLawson; also has a known desc thor AA measuring ~5+cm and followed by DrGearhart...  ~  seen by DrGearhart 4/10 w/ CT scan showing ~5cm thoracoabdominal aneurysm w/o signif change...  ~  seen by DrGearhart 2/11 in hosp w/ sl incr size of AAA- f/u planned 57mo ~  also followed by PV- DrCooper. ~  seen by DrGearhardt 10/11 & Hess, no change, f/u 1 yr. ~  Seen 12/12 by DrGearhardt & CTscan is Hess, continue conservative approach... ~  2/13:  [facility]... ~  4/13:  He had a fenestrated endovasc [facility] of Thor[facility]abdominal [facility] complic by lumbar epidural hematomas (on Coumadin) w/ paraplegia requiring readmission for laminectomy & evac of the hematomas;  [facility] and [facility] only spent 4d [facility] w/ altered mental status, pneumonia, AFib w/ rvr, & FTT> Neuro w/u was otherw neg (& he improved w/ supportive care)... ~  2/14:  CT Chest, Abdomen, Pelvis> per DrBrabham - see report (the abdominal component has enlarged)... 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 DrFarbers note indicates that to finish the repair he will require 3 branches and a left renal fenestration, they  quoted him a risk of  paraplegia in the 10-15% range & he will need preadmission for a lumbar drain to be placed, etc; he is awaiting the call from [facility]... ~  7/14: s/p surg at [facility] by DrFarber w/ 5 stents placed 04/15/13 in a >4h operation w/ special spinal precautions- he developed a blood clot in his left arm w/ some persist swelling; he has reg f/u at [facility]... ~  He maintains a regular sched of f/u visits at [facility] clinic w/ XRays, CT scans, dopplers, etc on a regular basis- results reviewed in Care Everywhere section of Epic...  VENOUS INSUFFICIENCY, EDEMA >>  ~  He has chronic venous insuffic w/ mild chronic edema maintained on a low sodium diet, elevation, support hose when nec, & LASIX17m/d... ~  7/15:  Presented 2wks after fall at home, left leg red/ swollen/ sl tender, no broken skin/ drainage/ not hot etc; VenDopplers showed NEG for DVT & Protime was too thin... ~  11/15: edema resolved and back to baseline...  HYPERCHOLESTEROLEMIA (ICD-272.0) - on LIPITOR 179md...  ~  FLSturgeon/07 showed TChol 115, Tg 58, HDL 36, LDL 67 ~  FLP 5/08 showed TChol 118, TG 66, HDL 31, LDL 74 ~  FLP 5/09 showed TChol 126, TG 71, HDL 37, LDL 75 ~  FLP 6/10 showed TChol 116, TG 73, HDL 40, LDL 62 ~  2/11:  FLP not checked during the hospitalization... ~  FLP 4/11 in hosp showed TChol 94, TG 52, HDL 23, LDL 61 ~  FLP 12/12 on Lip10 showed TChol 107, TG 38, HDL 40, LDL 60 ~  FLP 3/16 on Lip10 showed TChol 133, TG 82, HDL 43, LDL 74  HYPOTHYROIDISM >> he remains clinically euthyroid... ~  Labs 12/12 showed TSH= 6.10 ~  Labs 6/13 showed TSH= 6.66 ~  Labs 5/14 showed TSH= 6.21... We decided to start SYNTHROID 5041md... ~  Labs 5/15 on Levothy50 showed TSH= 1.92 ~  Labs 3/16 on Synthroid50 showed TSH= 2.82  DIVERTICULOSIS OF COLON (ICD-562.10) & COLONIC POLYPS (ICD-211.3) - hx polyps in 2003 = tubular adenoma... ~  last colonoscopy 9/06 showed divertics, hems, no polyps. ~  Pt followed by DrDBrodie & they decided to forgo any further  GI procedures/ colonoscopies...  ISCHEMIC BOWEL >> GeoNeryesented to the ER 7/23 - 04/25/12 w/ abd pain- ELap surg revealed ~5 feet of frankly ischemic small bowel, likely due to internal hernia and closed loop obstruction=> Bowel resected & pulses intact w/ no evidence of embolic or thrombotic pathology;  Viable small bowel anastomosed & wound vac applied; he had a Hess post op course...  RENAL INSUFFICIENCY >>  PROSTATE CANCER (ICD-185) - eval by DrWrenn and they decided on XRT by DrKinard- finished 5/09 and he states that DrKinard "released me"... he sees DrWrenn every 6 months & they follow PSA closely (notes reviewed)... ~  8/12: f/u prostate cancer dx in 2009 & treated w/ XRT completed 6/09; slowly rising PSA w/ doubling time 6-44mo41mon voiding symptoms, he is considering androgen ablation if needed... ~  6/13: He developed hematuria & urinary retention requiring Urology eval, discontinuation of Coumadin, & Foley placement=> subseq removed 8/13 w/ adeq voiding trial... ~  3/14:  He had f/u visit w/ DrWrenn- PSA nadir after XRT was <0.04 and it has climbed to 0.54 (3/14) w/ PSADT of ~47yr;6yry chose to continue watchful waiting... ~  1/15:  DrWrenn reports that PSA is up to 1.46 ~  Labs 5/15 on Lasix40 showed Cr=1.6,  BNP=184 ~  7/15: he had f/u w/ DrWrenn> BPH w/ BOO, Prostate ca; on Flomax0.4, PSA up to 1.84, neg bone scan x degen changes; they continue to follow w/ watchful waiting, ROV in 90mo.. ~  He maintains regular f/u w/ drWrenn> we do not have recent clinic notes but pt indicates that everthing is ok... ~  3/16: [facility] & reviewed in CNasonindicates thickening & trabeculation of the bladder wall & prox ascending bowel wall thickening... NOTE> they rec f/u w/ Urology & records sent in light of these findings... ~  Labs 3/16 showed BUN=32, Cr= 1.85...  DEGENERATIVE JOINT DISEASE (ICD-715.90) - s/p bilat TKR's, [facility]- DrOlin... He has OXY-IR '5mg'$  as  needed for pain...  MEMORY LOSS, SENILE DEMENTIA, Vasc disease/ infarcts on MRI/ global atrophy >> he tried [name] in the past & they stopped it... 05/2016> he's been sundowning & we prescribed Seroquel '25mg'$ - 1/2 to 1 tab as needed...   ACTINIC SKIN DAMAGE (ICD-692.70) - followed by DBrunetta Jeans he knows to avoid sun exposure, use sun screen, etc...  ANEMIA >> ~  Labs 6/13 showed Hg= 8.4, Fe= 31 (11%sat); Rec to start FeSO4 Bid... ~  Labs 7/13 in hosp showed Hg= 12.6=>8.2 at disch... ~  Labs 8/13 showed Hg= 9.5, MCV= 94; rec to continue FeSO4 Bid... ~  Labs 10/13 showed Hg= 13.2 & ok to wean Fe to 1/d til gone then stop... ~  Labs 5/14 showed Hg= 14.2 ~  Labs 5/15 showed Hg= 13.2 ~  Labs 3/16 showed Hg= 14.4   Past Surgical History:  Procedure Laterality Date  . ABDOMINAL AORTIC ANEURYSM REPAIR  1998  . APPENDECTOMY    . APPLICATION OF WOUND VAC  04/18/2012   Procedure: APPLICATION OF WOUND VAC;  Surgeon: TJoyice Faster Cornett, MD;  [place]  Service: General;  Laterality: N/A;  Removal of abdominal wound vac, Application of incisional  wound vac  . INGUINAL HERNIA REPAIR     left  . JOINT REPLACEMENT    . LAPAROTOMY  04/16/2012   Procedure: EXPLORATORY LAPAROTOMY;  Surgeon: BMadilyn Hook DO;  [place]  Service: General;  Laterality: N/A;  exploratory Laparotomy,Small bowel ressection abdominal wound vac placement.  .Marland KitchenLAPAROTOMY  04/18/2012   Procedure: EXPLORATORY LAPAROTOMY;  Surgeon: TJoyice Faster Cornett, MD;  [place] [facility]  Service: General;  Laterality: N/A;  exploratory laparotomy with small bowel anastomosis  . repair of AA  04/14/2013   [place] TOTAL KNEE ARTHROPLASTY     bilateral    Outpatient Encounter Prescriptions as of 06/29/2016  Medication Sig Dispense Refill  . atorvastatin (LIPITOR) 10 MG tablet TAKE 1 TABLET BY MOUTH DAILY (Patient taking differently: TAKE 1 TABLET (10 mg) BY MOUTH DAILY) 30 tablet 11  . Fluticasone-Salmeterol (ADVAIR  DISKUS) 250-50 MCG/DOSE AEPB inhale 1 dose by mouth twice a day (Patient taking differently: Inhale 1 puff into the lungs 2 (two) times daily. ) 180 each 3  . guaiFENesin (MUCINEX) 600 MG 12 hr tablet Take 600 mg by mouth 2 (two) times daily. COPD.    .Marland Kitchenlevothyroxine (SYNTHROID, LEVOTHROID) 50 MCG tablet TAKE 1 TABLET BY MOUTH DAILY 90 tablet 0  . metoprolol succinate (TOPROL XL) 25 MG 24 hr tablet Take 1 tablet (25 mg total) by mouth daily. 90 tablet 3  . Multiple Vitamin (MULTIVITAMIN) tablet Take 1 tablet by mouth daily.      .Marland Kitchenneomycin-polymyxin b-dexamethasone (MAXITROL) 3.5-10000-0.1 OINT Place  1 application into both eyes as needed (for eye irriation or itching).    [name] tiotropium (SPIRIVA HANDIHALER) 18 MCG inhalation capsule USE 2 INHALATIONS FROM ONE CAPSULE DAILY AS DIRECTED (Patient taking differently: Place 18 mcg into inhaler and inhale. USE 2 INHALATIONS FROM ONE CAPSULE DAILY AS DIRECTED) 90 capsule 1  . traMADol (ULTRAM) 50 MG tablet TAKE 1 TABLET BY MOUTH THREE TIMES DAILY AS NEEDED FOR PAIN (Patient taking differently: TAKE 1 TABLET BY MOUTH once DAILY AS NEEDED FOR PAIN) 90 tablet 0  . QUEtiapine (SEROQUEL) 25 MG tablet Take 1 tablet (25 mg total) by mouth at bedtime. 10 tablet 0   No facility-administered encounter medications on file as of 06/29/2016.     No Known Allergies   Current Medications, Allergies, Past Medical History, Past Surgical History, Family History, and Social History were reviewed in [employer] record.   Review of Systems        See HPI - all other systems neg except as noted...  The patient complains of dyspnea on exertion.  The patient denies anorexia, fever, weight loss, weight gain, vision loss, decreased hearing, hoarseness, chest pain, syncope, peripheral edema, prolonged cough, headaches, hemoptysis, abdominal pain, melena, hematochezia, severe indigestion/heartburn, hematuria, incontinence, muscle weakness, suspicious skin  lesions, transient blindness, difficulty walking, depression, unusual weight change, abnormal bleeding, enlarged lymph nodes, and angioedema.     Objective:   Physical Exam    WD, WN, 82 y/o WM in NAD... GENERAL:  Alert & oriented; pleasant & cooperative; pale complexion... HEENT:  /AT, EOM-wnl, PERRLA, EACs-clear, TMs-wnl, NOSE-clear, THROAT-clear & wnl. NECK:  Supple w/ fairROM; no JVD; normal carotid impulses w/o bruits; no thyromegaly or nodules palpated; no lymphadenopathy. CHEST:  decr BS bilat, scat bibasilar rales, w/o wheezing/ rhonchi/ signs of consolidation. HEART:  irregular rhythm, gr1/6 diast murmur in Ao area, without rubs or gallops detected... ABDOMEN:  Soft & nontender; normal bowel sounds; no organomegaly or masses palpated... EXT: s/p bilat TKR's, mod arthritic changes; +venous insuffic, left leg swelling/ red > right leg, no drainage NEURO:  CN's intact;  gait abn; no focal neuro deficits... DERM:  No lesions noted; no rash etc...  RADIOLOGY DATA:  Reviewed in the EPIC EMR & discussed w/ the patient...  LABORATORY DATA:  Reviewed in the EPIC EMR & discussed w/ the patient...   Assessment:      COPD/Emphysema, Hx Pneumonia>  baseline severe COPD/Emphysema w/ Cardiomeg, Aneurysm & bibasilar fibrosis, Hess on Advair250, Spiriva, Mucinex...  HBP>  HBP, CHF, AFib> Coumadin on HOLD after GIB, off ASA81, off Losar25, on MetopER25, & Lasix40 on HOLD; BP= 124/88 range & they note that DrFarber doesn't want it too low due to his atherosclerosis & the endograft; he last saw [facility] 12/2015...  CAD/ CHF/ etc>  Followed by Cherly Hensen & his prev notes are reviewed;  BNP 12/14 was up to 4200 & on Lasix40 it has improved to 184 by 5/15; 3/17 it measures 462 & reminded low sodium...Marland KitchenMarland KitchenMarland Kitchen  AFib>  On above + Coumadin followed in the [facility] but Protime became supratherapeutic 05/2016 causing GIB, AKI, etc; Coumadin on hold now...  Periph Vasc Dis>  S/p AAA repair 1998 by Sheryn Bison; then  followed by DrGearhardt for thoraco-abd aneurysm & referred to [facility] for stent graft surg- done 4/13 by DrFarber w/ complications, and 2nd stage of the surgery 7/14 w/ 5 stents placed per DrFarber... They continue to follow regularly w/ CT scans every 25mo..  Ven Insuffic & Edema>  Prev improved  w/ Lasix40/d;  He knows to elim salt, elev legs, wear support hose, etc;  He fell 7/15 w/ bruise reported, then followed by redness in skin & incr left leg swelling=> VenDoppler is neg for DVT & Protime was too thin=> adjusted; swelling resolved over time & back to baseline...  CHOL>  FLP looks good on Lip10...  Subclinical Hypothyroid>  TSH was 6.21 and Synthroid50 started 5/14=> TSH improved to 2.82...  GI> Presbyesoph, Divertics, Hx polpyps>  Hess & followed by DrDBrodie... 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 05/2016>  ADM w/ GIB due to INR=6.7; seen by DrSchooler, no intervention; treated w/ PPI but not disch on one...Marland KitchenMarland KitchenMarland Kitchen  Prostate Cancer>  Followed by DrWrenn  w/ slowly rising PSA as noted; then developed urinary retention after lumbar surg and hematuria after foley & coumadin; now back to baseline & being followed... 9/16> there is a post bladder wall abn on CT [facility]- copy given to pt to take to f/u appt w/ DrWrenn... 9/17> he is followed by DrWrenn- hx prostate Ca w/ prev XRT 2009 & PSA recurrence (PSA=2.8 [id number] from nadir <0.04); Hosp w/ GIB, had UTI w/ Klebs, urinary retention, & foley placed, outpt management by Urology...  DJD>  Followed by DrOlin, s/p bilat TKRs...  ANEMIA>  He developed acute blood loss anemia w/ post op Hg= 11-12 but dropped to 8.6 w/ hematuria & INR=4.2; Coumadin & ASA placed on HOLD; f/u labs showed Hg=8.4 & Fe=31 & started on FeSO4 Bid;  Then Hg=9.5 & rec to continue Fe Bid;  Cards restarted his Coumadin;  Now Hg= 14.4  05/2016> he was HOSP by [facility] w/  GIB/melena, INR=6.7, Hg=8.1, low Fe- given 2u Tx & rec for OTC Fe...  Actinic skin changes>  Aware, he is monitored by Derm...     Plan:     Patient's Medications  New Prescriptions   QUETIAPINE (SEROQUEL) 25 MG TABLET    Take 1 tablet (25 mg total) by mouth at bedtime.  Previous Medications   ATORVASTATIN (LIPITOR) 10 MG TABLET    TAKE 1 TABLET BY MOUTH DAILY   FLUTICASONE-SALMETEROL (ADVAIR DISKUS) 250-50 MCG/DOSE AEPB    inhale 1 dose by mouth twice a day   GUAIFENESIN (MUCINEX) 600 MG 12 HR TABLET    Take 600 mg by mouth 2 (two) times daily. COPD.   LEVOTHYROXINE (SYNTHROID, LEVOTHROID) 50 MCG TABLET    TAKE 1 TABLET BY MOUTH DAILY   METOPROLOL SUCCINATE (TOPROL XL) 25 MG 24 HR TABLET    Take 1 tablet (25 mg total) by mouth daily.   MULTIPLE VITAMIN (MULTIVITAMIN) TABLET    Take 1 tablet by mouth daily.     NEOMYCIN-POLYMYXIN B-DEXAMETHASONE (MAXITROL) 3.5-10000-0.1 OINT    Place 1 application into both eyes as needed (for eye irriation or itching).   TIOTROPIUM (SPIRIVA HANDIHALER) 18 MCG INHALATION CAPSULE    USE 2 INHALATIONS FROM ONE CAPSULE DAILY AS DIRECTED   TRAMADOL (ULTRAM) 50 MG TABLET    TAKE 1 TABLET BY MOUTH THREE TIMES DAILY AS NEEDED FOR PAIN  Modified Medications   No medications on file  Discontinued Medications   No medications on file

## 2016-06-29 NOTE — Patient Instructions (Signed)
Today we updated your med list in our EPIC system...    Continue your current medications the same...  We wrote a new medication to try> SEROQUEL 25mg  one tab each day given in the evening to help w/ the agitation etc.   Today we rechecked his Metabolic panel & blood count...    We will contact you w/ the results when available...   Call for any questions or if we can be of service in any way...  Ryan Hess already has an appt to see [place] back next week.Marland KitchenMarland Kitchen

## 2016-06-30 ENCOUNTER — Telehealth: Payer: Self-pay | Admitting: Pulmonary Disease

## 2016-06-30 ENCOUNTER — Other Ambulatory Visit: Payer: Self-pay | Admitting: Pulmonary Disease

## 2016-06-30 DIAGNOSIS — K921 Melena: Secondary | ICD-10-CM

## 2016-06-30 NOTE — Telephone Encounter (Signed)
PA for Seroquel initiated through [facility] Key: [place] information has been submitted to [employer] [place]. [place] will review the request and notify you of the determination decision directly, typically within 3 business days of your submission and once all necessary information is received. You will also receive your request decision electronically. To check for an update later, open the request again from your dashboard. If [place] has not responded within the specified timeframe or if you have any questions about your PA submission, contact [place] directly at [facility]) [phone] or ([place]) [phone].

## 2016-07-03 ENCOUNTER — Telehealth: Payer: Self-pay | Admitting: Pulmonary Disease

## 2016-07-03 MED ORDER — TRAMADOL HCL 50 MG PO TABS: 50.0000 mg | ORAL_TABLET | Freq: Three times a day (TID) | ORAL | 0 refills | Status: AC | PRN

## 2016-07-03 NOTE — Telephone Encounter (Signed)
PA was approved.   Effective from 06/30/2016 through 06/30/2017.  Pharmacy notified.   ATC pt, n/a and no vm. WCB

## 2016-07-03 NOTE — Telephone Encounter (Signed)
Per SN: okay to refill Tramadol 50mg  #90 1po TID prn pain, refill x5.  [facility] and spoke with pharmacist Marya Amsler - verbal order given for the Tramadol as written by SN.   Med list updated Left detailed message on named VM informing Coralyn Mark of the above Nothing further needed; will sign off

## 2016-07-03 NOTE — Telephone Encounter (Signed)
Spoke with pt son (DPR) pt is requesting tramdol for back pain from previous back surgery.  SN please advise if ok to refill. Thanks.

## 2016-07-04 ENCOUNTER — Other Ambulatory Visit (INDEPENDENT_AMBULATORY_CARE_PROVIDER_SITE_OTHER): Payer: Medicare Other

## 2016-07-04 DIAGNOSIS — K921 Melena: Secondary | ICD-10-CM

## 2016-07-04 LAB — COMPREHENSIVE METABOLIC PANEL
ALT: 6 U/L (ref 0–53)
AST: 29 U/L (ref 0–37)
Albumin: 3.1 g/dL — ABNORMAL LOW (ref 3.5–5.2)
Alkaline Phosphatase: 282 U/L — ABNORMAL HIGH (ref 39–117)
BUN: 42 mg/dL — ABNORMAL HIGH (ref 6–23)
CO2: 19 mEq/L (ref 19–32)
Calcium: 8.7 mg/dL (ref 8.4–10.5)
Chloride: 112 mEq/L (ref 96–112)
Creatinine, Ser: 3.27 mg/dL — ABNORMAL HIGH (ref 0.40–1.50)
GFR: 18.98 mL/min — ABNORMAL LOW (ref >60.00)
Glucose, Bld: 74 mg/dL (ref 70–99)
Potassium: 4.2 mEq/L (ref 3.5–5.1)
Sodium: 145 mEq/L (ref 135–145)
Total Bilirubin: 1.4 mg/dL — ABNORMAL HIGH (ref 0.2–1.2)
Total Protein: 6.5 g/dL (ref 6.0–8.3)

## 2016-07-04 NOTE — Telephone Encounter (Signed)
Pt returning call.Ryan Hess ° °

## 2016-07-04 NOTE — Telephone Encounter (Signed)
LMTC x 1  

## 2016-07-04 NOTE — Telephone Encounter (Signed)
Left detailed message on machine that PA for Seroquel has been approved.  Contact our office for further questions.

## 2016-07-05 ENCOUNTER — Inpatient Hospital Stay: Payer: Medicare Other | Admitting: Pulmonary Disease

## 2016-07-05 ENCOUNTER — Ambulatory Visit (INDEPENDENT_AMBULATORY_CARE_PROVIDER_SITE_OTHER): Payer: Medicare Other | Admitting: Pulmonary Disease

## 2016-07-05 VITALS — BP 126/72 | HR 73 | Temp 97.0°F | Wt 170.0 lb

## 2016-07-05 DIAGNOSIS — M159 Polyosteoarthritis, unspecified: Secondary | ICD-10-CM

## 2016-07-05 DIAGNOSIS — I10 Essential (primary) hypertension: Secondary | ICD-10-CM

## 2016-07-05 DIAGNOSIS — I481 Persistent atrial fibrillation: Secondary | ICD-10-CM | POA: Diagnosis not present

## 2016-07-05 DIAGNOSIS — J449 Chronic obstructive pulmonary disease, unspecified: Secondary | ICD-10-CM

## 2016-07-05 DIAGNOSIS — I5022 Chronic systolic (congestive) heart failure: Secondary | ICD-10-CM | POA: Diagnosis not present

## 2016-07-05 DIAGNOSIS — I739 Peripheral vascular disease, unspecified: Secondary | ICD-10-CM

## 2016-07-05 DIAGNOSIS — M150 Primary generalized (osteo)arthritis: Secondary | ICD-10-CM

## 2016-07-05 DIAGNOSIS — I4819 Other persistent atrial fibrillation: Secondary | ICD-10-CM

## 2016-07-05 DIAGNOSIS — N289 Disorder of kidney and ureter, unspecified: Secondary | ICD-10-CM

## 2016-07-05 DIAGNOSIS — J4489 Other specified chronic obstructive pulmonary disease: Secondary | ICD-10-CM

## 2016-07-05 NOTE — Patient Instructions (Signed)
Today we updated your med list in our EPIC system...    Continue your current medications the same...  LABS yest showed slow steady improvement...    Continue your same meds for now...    Gradually increase your activity level...   Call for any questions...  Let's plan a follow up visit in 2wks, sooner if needed for problems.Marland KitchenMarland Kitchen

## 2016-07-06 ENCOUNTER — Other Ambulatory Visit: Payer: Self-pay | Admitting: Pulmonary Disease

## 2016-07-07 ENCOUNTER — Telehealth: Payer: Self-pay | Admitting: Cardiovascular Disease

## 2016-07-07 ENCOUNTER — Telehealth: Payer: Self-pay | Admitting: *Deleted

## 2016-07-07 ENCOUNTER — Encounter: Payer: Self-pay | Admitting: Pulmonary Disease

## 2016-07-07 ENCOUNTER — Other Ambulatory Visit: Payer: Self-pay | Admitting: *Deleted

## 2016-07-07 MED ORDER — WARFARIN SODIUM 1 MG PO TABS: 1.0000 mg | ORAL_TABLET | ORAL | 2 refills | Status: DC

## 2016-07-07 NOTE — Progress Notes (Signed)
Subjective:     Patient ID: Ryan Hess, male   DOB: Apr 16, 1924, 82 y.o.   MRN: [id number]  HPI 82 y/o WM here for a follow up visit... he has multiple medical problems as noted below...   ~  SEE PREV EPIC NOTES FOR OLDER DATA >>     5/14:  Ryan Hess is awaiting a call from Ryan Hess at [facility] about the next stage in his planned thoracoabd aneurysm repair- he had his situation reviewed by Ryan Hess here & he rec f/u at [facility] per the original plan since this needs to be done endovascularly & is quite complicated...   LABS 5/14:  Chems- ok w/ stable mild RI (Cr=1.5);  CBC- wnl w/ Hg=14.2;  TSH=6.21 (Synthroid50 started);  BNP=263;  Protimes adjusted by the CC...   Ryan Hess has had his f/u surg at [facility] by Ryan Hess in a >4h operation w/ special spinal precautions; he developed a blood clot in his left arm w/ some persist swelling...  CXR 10/14 showed chr changes of Ao stent graft, stable cardiomegaly, no infiltrates, NAD...   LABS 10/14:  CBC- wnl;  Protimes followed in CC...    LABS 5/15:  Chems- ok x BUN=26 Cr=1.6;  CBC- wnl w/ Hg=13.2;  TSH=1.92 on Levothy50;  BNP=184...   LABS 7/15:  Chems- ok x BUN=33 Cr=2.0;  CBC- ok w/ Hg=12.2 WBC=6.9;  Protime=49sec, INR=4.8;  Sed=37  D-Dimer=2.50  CRP=4.4  VenDopplers 7/15 showed NEG for DVT...   ~  December 14, 2014:  67moROV & [name] a follow up appt w/ [facility] and [place]> see details below but everything stable and no med changes required:  CXR 1/16 revealed stable thoracoabdominal aortic endograft- no change, stable heart size, COPD/Emphysema & mild right basilar atelectasis, NAD...  Abd XRay 1/16 showed 4-vessel fenestrated thoracoabdominal aortic endograft w/ branch stents in celiac/ SMA/ & bilat renal arts- all unchanged, extensive DDD in spine, L4 compression- all unchanged...   Renal/ Mesenteric Ultrasound 1/16> no evid of hemodynamically signif RAS  noted bilat, right renal cyst, distal SMA stent/ mid & distal SMA were normal, celiac & prox SMA stent not visualized...   CT Chest/ Abd/ Pelvis 1/16 w/o contrast> stable to sl decr in size of the mid thoracic Ao aneurysm, stable appearance of the 4-vessel fenestrated thoracoabdominal endograft; thickening & trabeculation of the bladder wall & prox ascending bowel wall thickening... NOTE> they rec f/u w/ Urology & GI in light of these findings... Ryan Hess was very pleased w/ his f[facility]> he states he is feeling well, now 82y/o, and denies CP, palpit, dizzy, change is dyspnea, & no edema; he is able to walk to the mailbox slowly & does ok- this is stable; he notes min cough/ beige sput/ no hemoptysis or f/c/s; he notes that he is a little more forgetful & son confirms some progressive decline in memory- they are ready to try [name] Rx (start w/ 521md 7 incr to 1058m)...     COPD, HxPneumonia> on Advair250Bid & Spiriva daily, plus Mucinex600Bid; denies resp exac- continue same, avoid infections...    Cards- HBP, CHF, AFib> on Coumadin via CC, ASA81, Losar100, Lasix40; BP= 122/60 & they note that Ryan Hess doesn't want it too low due to his atherosclerosis & the endograft; he is overdue for Cards follow up visit...     Peripheral Vasc Dis> see above...    Hyperlipidemia> on Atorva10; FLP today  showed TChol 133, TG 82, HDL 43, LDL 74... continue same Rx...    Hypothyroid> on Synthroid50; clinically euthyroid & Labs 3/16 showed TSH= 2.82...    GI- HxDivertics, Polyps, ischemic bowel> see above; CT Abd&Pelvis w/o contrast 1/16 at Ryan Hess w/ focal thickening in bowel wall in Prox asc colon; he saw Ryan Hess last 2012 & they decided to forgo any f/y colonoscopies for the obvious reasons; we will follow clinically- watch blood counts and stools, Ryan Hess plans f/u CT scans in another 93mo..     GU- prostate cancer and RI> he is followed by Ryan Hess for Urology & seen several weeks ago by his hx (we do  not have notes from them); pt reports everything was good & I will [facility]notes & [facility] to Urologist's attn...    DJD> Ryan Hess to be stable, s/p bilat TKRs, on Tramadol & tylenol prn...    Anemia> Labs today showed Hg= 14.4, MCV= 94, all back to wnl... We reviewed prob list, meds, xrays and labs> see below for updates >>   LABS 3/16:  FLP- at goals on Atorva10;  Chems- ok w/ Cr improved to 1.85;  CBC- wnl;  TSH=2.82 on Synthroid50... PLAN>>  We decided to start [name] 5=>171md for his vascular dementia & memory loss;  He will f/u w/ Cards at his earliest convenience; we will send [facility]otes & scan report to Ryan Hess, Urology to review...   ~  June 16, 2015:  28m29moV & Ryan Hess that he is doing well- feeling good, min cough, no sput, no blood, stable SOB/DOE w/o change; he ambulates w/ a cane & needs more exercise, we discussed this- not having any CP, palpit, edema...  He had thorough 28mo228mo at Ryan Hess 04/2015 w/ studies below-- no changes made & they are continuing 28mo 35mo; they did rec f/u by Urology due to CT scan findings and he has an upcoming appt w/ Ryan Hess...   CXR 8/16> similar appearance to the thoracoabd endograft, lungs appear unchanged...   Abd film 8/16> similar appearance of the endograft w/o kink/ stenosis/ or other complic evident; degen changes in the spine...  Renal/ Mesenteric Ultrasound 8/16> no evid of renal art stenosis bilat w/ patent stents; norm hepatic & mesenteric art findings w/ patent SMA stent  CTA Chest/ Abd/ Pelvis 8/16> no change in appearance of the Aortic fenestrated endograft spaning the desc aorta to the infrarenal AA w/ stent limbs to the celiac art, SMA, bilat renal arts; 2 areas of aneurysmal dilatation- mid-thor sac measures 5.8cm (prev 5.5cm), & upper abd aneurysm sac measures 7.2cm (prev 7.5cm); apical scarring & severe centrilob & paraseptal emphysema, 1cm RUL nodule unchanged, no adenopathy; right renal cyst & irreg enhancing  post bladder wall thickening which is new; radiotherapy seeds in prostate; esoph is patulous w/ A/F level distally; DJD & L4 compression w/o change... COPY TO PT TO SHOW TO Dr. WrennJeffie PollockLABS [facility]:  BUN=31, Cr=1.87 EXAM shows Afeb, VSS, O2sat=97% on RA;  HEENT- neg, mallampati1;  Chest- sl decr BS at bases w/ scat rhonchi, no w/r/consolidation;  Heart- irreg AFib, Gr1/6SEM w/o r/g;  Abd- soft, nontender;  Ext- w/o c/c/e;  Neuro- intact...    COPD, HxPneumonia> on Advair250Bid & Spiriva daily, plus Mucinex600Bid; denies resp exac- continue same, avoid infections...    Cards- HBP, CHF, AFib> on Coumadin via CC, ASA81, Losar50, Lasix40; BP= 116/50 & they note that Ryan Hess doesn't want it too low due to his atherosclerosis & the endograft;  he is overdue for Cards follow up visit...     Peripheral Vasc Dis> see above per Ryan Hess at [facility]...    Hyperlipidemia> on Atorva10; FLP 3/16 showed TChol 133, TG 82, HDL 43, LDL 74... continue same Rx...    Hypothyroid> on Synthroid50; clinically euthyroid & Labs 3/16 showed TSH= 2.82...    GI- HxDivertics, Polyps, ischemic bowel> see above; CTA Abd&Pelvis 8/16 at [facility] looked ok; he saw Ryan Hess last 2012 & they decided to forgo any f/u colonoscopies; we will follow clinically- watch blood counts and stools...    GU- prostate cancer and RI> he is followed by Ryan Hess for Urology & he has up coming appt to address the abn finding on CTA Abd/Pelvis w/ post bladder wall thickening...    DJD> Ryan Hess appears to be stable, s/p bilat TKRs, on Tramadol & Tylenol prn...    Anemia> Labs 3/16 showed Hg= 14.4, MCV= 94, all back to wnl...    Memory Loss> we had prev started [name]5=>10 but pt has stopped this med & they do not want to pursue this eval or Rx... IMP/PLAN>>  We sent a copy of his CT Chest/ Abd/ Pelvis from [facility] report) with the pt to give to Ryan Hess; he will continue current meds and incr exercise; overdue for Cards f/u & they will call; Given  2016 FLU shot today...  ~  December 14, 2015:  62moROV & Ryan Hess reports that he is doing OK overall- he has developed a small skin ulser over his sacrum 7 we discussed avoiding pressure on this area (use donut), wash/ gauze/ vaseline or desitin/ etc, they will call if not resolving... He reports breathing is good- sl cough, sm amt yellow sput, no f/c/s, no CP & stable SOB/DOE (no change...  EXAM shows Afeb, VSS, O2sat=97% on RA;  HEENT- neg, mallampati1;  Chest- sl decr BS at bases w/ scat rhonchi, no w/r/consolidation;  Heart- irreg AFib, Gr1/6SEM w/o r/g;  Abd- soft, nontender;  Ext- w/o c/c/e;  Neuro- intact...    COPD, HxPneumonia> on Advair250Bid & Spiriva daily, plus Mucinex600Bid; denies resp exac- continue same, avoid infections...    Cards- HBP, CHF, AFib> on Coumadin via CC, ASA81, Aten25, Losar25, Lasix40; BP= 112/70 & they note that Ryan Hess doesn't want it too low due to his atherosclerosis & the endograft; he saw DrNishan 11/15/15-- HBP, AAA repair, [facility] on coumadin- he restarted Aten25 for nodal blockade..    Peripheral Vasc Dis> [facility]last seen in [facility] 04/2015 w/ CTA chest/abd/pelvis- note reviewed)...    Hyperlipidemia> on Atorva10; FLP 3/17 showed TChol 120, TG 98, HDL 36, LDL 65... continue same Rx...    Hypothyroid> on Synthroid50; clinically euthyroid but Labs 3/17 showed TSH= 6.98 so we rec incr Synthroid 7173m/d...    GI- HxDivertics, Polyps, ischemic bowel> see above; [facility] he saw [facility] last 2012 & they decided to forgo any f/u colonoscopies; we will follow clinically- watch blood counts and stools...    GU- prostate cancer, RI, chr cystitis> he is followed by Ryan Hess for Urology, abn finding on CTA Abd/Pelvis w/ post bladder wall thickening, last seen 10/22/15- cysto 06/2015 w/ inflamm in bladder but no mass, cyto neg, he's had hematuria & prev on 73m71moppressive Keflex for chr cystitis,  they are following closely...    DJD> GeoAbhinavpears to be stable, s/p bilat TKRs, LBP, etc; on Tramadol & Tylenol prn...Marland KitchenMarland Kitchen  Anemia> Labs 3/17 showed Hg= 14.6, MCV= 94, all back to wnl...    Memory Loss> we had prev started [name]5=>10 but pt has stopped this med & they do not want to pursue this eval or Rx...  EKG 11/15/15>  Chr AFib, rate114, LAD, PVC, NSSTTWA...  CXR 12/14/15>  Norm heart size, extensive Ao stent graft in place, coarse interstitial markings bilat- NAD= no infiltrate/ masses/ fluid/ etc...  LABS 12/14/15>  FLP-  At goals on Lip10;  Chems- ok x BUN=32, Cr=2.00, AlkPhos=235 & GGT=119 (7-51);  BNP=462 on Lasix40;  CBC- wnl w/ Hg=14.6, wbc=11.6;  TSH=6.98 IMP/PLAN>>  Ryan Hess is 83 y/o 7 remarkably stable for all he's been through- rec to continue current meds, continue regular follow up visits, avoid infections, stay active, & he will call for any problems in the interval...  ~  June 29, 2016:  71moROV & post hosp check>  [facility] due to melena, weakness, hypotension, AKI w/ BUN=141, Cr=5.39 (from his baseline of 32/2.0) , UTI w/ sens Klebs, and anemic w/ Hg=8.1 (from baseline~14), mcv=92, Fe=11 (7%sat), stool heme pos & his INR was 6.7 (Coumadin held by CC several days prior to adm);  Rx w/ VitK, 2uPCs, PPI, and Coumadin held;  GI consulted- they decided against endoscopic studies or further testing;  He had an unresponsive episode during the hosp (occured after a bowel movement & felt to be vagally mediated)- MRI Brain was NEG for acute abn;  Urology was consulted for mild hydronephrosis, hx prostate Ca w/ prev XRT 2009 & PSA recurrence (PSA=2.8 [id number] nadir <0.04)- foley placed & will be managed as outpt;  Nephrology also consulted- prev Losartan & Lasix were held; Klebs UTI treated w/ Cephalosporin x10d;  AKI (hypotension, GIB, meds, mild hydro) and CKD4- not a dialysis cand, he was treated w/ IVF, bicarb, may need Lasix restarted as outpt (no ACE,  ARB, NSAIDs)...    Since disch he remains weak, doesn't want to eat or do anything according to his son, agitated & confused Qhs & sun-downing;  Foley in- draining clear urine;  They have home health nurse checking on Pt... We decided to try a low dose SEROQUEL 282m1/2 to 1 Qhs for the sun-downing, agitation, etc & it worked well the first night, son will assess need daily... we reviewed & updated the following medical problems during today's office visit >>     COPD, HxPneumonia> on Advair250Bid & Spiriva daily, plus Mucinex600Bid; denies resp exac- continue same meds, avoid infections...    Cards- HBP, CHF, AFib> Hosp 05/2016 w/ INR=6.7, GIB, & AKI- Coumadin held/ reveresed, off Lasix, on MetopER25; BP= 124/88 & they note that Ryan Hess doesn't want BP too low due to his atherosclerosis & the endograft; he saw DrNishan 12/2015-- HBP on Aten25, AAA repair[facility] reviewed, no change in meds...    Peripheral Vasc Dis> [facility] w/ CTA chest/abd/pelvis- note reviewed) & they are to contact pt soon regarding future appts...    Hyperlipidemia> on Atorva10; FLP 3/17 showed TChol 120, TG 98, HDL 36, LDL 65... continue same Rx...    Hypothyroid> on Synthroid50; clinically euthyroid but Labs 3/17 showed TSH= 6.98 & pill count was way off, son to control his meds, still on 5057md w/ TSH 05/2016 = 5.31, continue same for now...    GI- HxDivertics, Polyps, hx ischemic bowel, GIB 05/2016  when PT/INR=6.7> see above; CT[facility]Pelvis 8/[facility] at [facility] looked [facility]; he saw Ryan Hess last 2012 & they decided to forgo any f/u colonoscopies; GIB 05/2016 when INR=6.7 Hosp by [facility] & seen by DrSchooler, no interventions- Coumadin held, reveresed, transfused, etc; no abd pain, n/v, had mod stool burden, melena resolved...    GU- prostate cancer, chr cystitis, CKD4> followed by Ryan Hess for Urology, abn finding on CTA Abd/Pelvis w/ post bladder  wall thickening, last seen 10/22/15- cysto 06/2015 w/ inflamm in bladder but no mass, cyto neg, he's had hematuria & prev on 54mosuppressive Keflex for chr cystitis;  ADM 05/2016 w/ GIB, INR=6.7, AKI w/ Cr=5.39, mild left hydro- Foley, IVF & Tx, etc & improved to Cr=4.39; followed by Urology & Neph DrPatel/Deterding not a dialysis cand.    DJD> GDabneyappears to be stable, s/p bilat TKRs, LBP, etc; on Tramadol & Tylenol prn...    Anemia> Labs 3/17 showed Hg= 14.6, MCV= 94; then 05/2016 ADM w/ GIB & INR=6.7, Hg was 8.1 w/ Fe=11 (7%), transfused 2u & needs longer term FeSO4...    Memory Loss, MRI w/ vasc dis, old infarcts, atrophy> we had prev started [name]5=>10 but pt has stopped this med & they do not want to pursue this eval or Rx... EXAM shows Afeb, VSS w/ BP=124/88, O2sat=100% on RA;  HEENT- neg, mallampati1;  Chest- sl decr BS at bases w/ scat rhonchi, no w/r/consolidation;  Heart- irreg AFib, Gr1/6SEM w/o r/g;  Abd- soft, nontender;  Ext- w/o c/c/e;  Neuro- intact w/o focal deficits...   CT Abd&Pelvis 06/17/16>  Lung bases show emphysema & patchy subpleural reticulation, coronary atherosclerosis; no acute intra-abd abn- mild sigm divertics w/o -itis, mod stool burden; Ao atherosclerosis w/ complex thoracoabd aneurysm s/p Ao-bi-iliac stent graft (maxdiameter 7.0 cm w/ celiac/ SMA/ bilat renal art stents in place); nonobstructing renal stone, cysts, bladder wall thickening, hepatic steatosis, L4 compression...   Abd Sonar 06/22/16>  Signs medical renal dis bilat, right renal cyst, foley in bladder, no hydroneph present...  MRI Brain 06/21/16>  No acute ischemia or hemorrhage, mod to severe ventriculomegaly & global parenchymal vol loss, old left cerebellar infarcts, old right thalamic lacunar infarct, attenuated left vertebral & basilar arteries, small vessel dis & ectatic ICAs,   EKG 06/17/16>  Afib, LAD, low voltage limb leads, NSSTTWA...   LABS 05/2016 hosp> reviewed in Epic, pertinent numbers above in  prob list... IMP/PLAN>>  Prob List updated w/ recent GIB, supratherapeutic INR, anemia, AKI, etc as above;  For now has Foley & appt w/ Ryan Hess for f/u;  We will recheck labs regarding CBC, renal function, & decision regarding restart of Coumadin for his Afib & vasc dis;  He will also need f/u w/ Nephrology & Cardiology, son indicates too difficult [facility]... we plan short term recheck in 1 week.  ~  July 05, 2016:  1wk ROV recheck & don indicates that GEastenis sl improved, got a good night's rest w/ Seroquel, although still up & down w/ the Foley cath- removed yest by Ryan Hess, using depends, bowel movement wore him out, feels SOB/ weak/ but slowly improving- confusion is better, eating better,etc...  EXAM shows Afeb, VSS w/ BP=126/72, O2sat=98% on RA;  HEENT- neg, mallampati1;  Chest- sl decr BS at bases w/ scat rhonchi, no w/r/consolidation;  Heart- irreg AFib, Gr1/6SEM w/o r/g;  Abd- soft, nontender;  Ext- w/o c/c/e;  Neuro- intact w/o focal deficits...   LABS 06/2016 via office>  Chems- improved w/ BUN=42, Cr=3.27, Alb=3.1;  CBC- improved w/ Hg=10.5.Marland KitchenMarland Kitchen IMP/PLAN>>  Slowly improving w/ foley out, urology following, rec FeSO4 '325mg'$ /d, and we will call coumadin clinic to restart Coumadin but w/ INR target ~2.0;  He has Cards f/u w/ Cherly Hensen at end of the month...           Problem List:  COPD (ICD-496) Hx of PNEUMONIA, ORGANISM UNSPECIFIED (ICD-486) PULMONARY NODULE & CAVITY RLL (ICD-518.89) - he is an ex-smoker, having quit in [place] after 40 yrs of smoking... he was exercising regularly and walking daily ~20m 5-6 days per week... on ABiglervilledaily, +MUCINEX 2Bid w/ Fluids... ~  baseline CXR & CTChest w/ marked emphysema, atheromatous calcif, 5cm desc thorAA...  ~  CXR & CTA 2/11 in hosp showed cardiomeg + coronary calcif, advanced atherosclerosis of Ao w/ aneuryms 5.8cm into abd, underlying emphysema w/ interst edema & bilat effusions, no  PE... ~  4/11:  RLL pneumonia which was slow to clear... ~  6/11:  f/u CXR & CT Chest 6/11 w/ 6cm cavitary area RLL & nodular component inferiorly, +hilar adenopathy, no mediastinal nodes, etc; Tumor markers showed CEA=7.5 & Ca19-9= 10.8; > IR felt lesion was too hi risk for bx, therefore contin Rx & observe. ~  8/11:  f/u CXR w/o change in RLL opac (no worsening)>> repeat CEA=6.5; plan f/u CT Chest in 227mo~  10/11: f/u CT Chest showed interval decrease in size of RLL cavitary lesion & the inferiorly placed soft tissue component; otherw there is severe emphysema, cardiomeg, coronary calcif, thoracoabd aneurysm w/o change; there was an air-fluid level in a dilated esoph> check Ba Esophagram= Nonspecific esophageal motility disorder with very poor primary esophageal contractions (no HH, stricture, etc)... ~  12/12: f/u CTChest showed severe emphysema & scarring w/ bibasilar atx (no infiltrates no cavities), markedly tortuous & ectaticThorAo w/ extensive atherosclerotic changes & no change in decr thor aneurysm measuring 5.9 x 5.6cm, mild cardiomeg & dense coronary calcif as well... ~  4/13:  He had a fenestrated endovasc [facility]abdominal [facility] ~  subseq CXRs thru 7/13 hosp w/ cardiomeg, low lung vols, bibasilar airsp dis, etc... ~  CXR 1/14 showed cardiomeg, bibasilar fibrosis, thoracic aortic stent noted, NAD...[name]~  7/14:  ?RUL pneumonia treated w/ Avelox & resolved=> [facility]  CXR 10/14 showed chr changes of Ao stent graft, stable cardiomegaly, no infiltrates, NAD...[name]~  5/15: on Advair250, Spiriva, Mucinex; last CT 12/12 w/ severe emphysema & scarring, plus his extensive atherosclerotic dis/ ectatic thorAo/ aneurysm/ etc; his breathing is stable- mild cough, min sput, no hemoptysis, stable DOE, etc. ~  11/15: stable on same meds... ~  [facility]- no change, stable heart size,  COPD/Emphysema & mild right basilar atelectasis, NAD...Marland KitchenMarland Kitchen  CT Chest (along w/ Abd&Pelvis[facility] dec[facility] in size of the mid thoracic Ao aneurysm, stable appearance of the 4-vessel fenestrated thoracoabdominal endograft... ~  3/16: on Advair250Bid & Spiriva daily, plus Mucinex600Bid; denies resp exac; [facility]- stable & no changes made...    HYPERTENSION (ICD-401.9)                         << followed by DrCherly Hensenor Cards >> CONGESTIVE HEART FAILURE (ICD-428.0) ATRIAL FIBRILLATION (ICD-427.31) w/ clot in left atrial appendage >> resolved  on f/u TEE, on COUMADIN per Cards & cardioverted. PREMATURE VENTRICULAR CONTRACTIONS, FREQUENT (ICD-427.69)  MEDS> prev on ATENOLOL '25mg'$ /d, & LASIX '20mg'$ -2AM + KCl 1mqBid, and off Losartan... prev Norvasc discontinued & hx ACE cough in the past on Lisinopril. ~  NuclearStressTest 1/05 was neg- no ischemia or infarct (+diaphrag attenuation), EF=56%... ~  repeat Nuclear study 6/09 was neg- no ischemia, mild inferoapic thinning, not gated due to PVCs... ~  2DEcho 6/09 showed mild dilated LV w/ EF= 45-50% but no regional wall motion abn, mild AoV calcif & AI, LA mild dil... ~  12/10:  changed from Atacand to LClosterto save $$ ~  2/11:  in hosp- Cath= mild nonobstructive 3 vessel CAD, mod LVD w/ EF=40-45%; TEE= CHF w/ EF~45% w/ HK & left atrial appendage clot & AFib; he was diuresed & Norvasc stopped, placed on Coumadin w/ careful f/u by DCherly Hensen& the CC... ~  [facility] holding NSR w/ PVCs... ~  Recurrent AFib w/ RVR after his Thoracoabdominal stent graft 4/13... ~  6/13:  BP was low assoc w/ GU bleeding & ATENOLOL25 & LOSARTAN50 placed on HOLD... ~  COUMADIN on HOLD since GU bleeding & acute blood loss anemia; it has not yet been restarted... ~  8/13:  BP= 116/78 on LASIX '40mg'$ /d & K10-Bid;  Wt down 14# to 173# ~  10/13:  BP= 114/82 on Lasix40+K1-Bid (he takes Aten25 if BP>110 at home)... ~  12/13: he  had Cards f/u DrNishan> note reviewed, no change in meds, he referred him to DrGearhart for f/u stent [facility] ~  5/14: HBP, CHF, AFib w/ hx clot in LA appendage- resolved on Coumadin> on ASA81, Aten25, Lasix40, K10Bid; BP= 114/72 & he denies CP, palpit etc; followed by DCherly Hensenfor Cards- his notes are reviewed, stable- no changes made. ~  8/14: [facility]... ~  5/15: on ASA81, Coumadin, Aten25 (if BP>110), Losartan100, Lasix40; BP= 120/64 & he denies CP, palpit etc; followed by DCherly Hensenfor Cards- his notes are reviewed, stable- BNP 5/15= 184... ~  7/15: on ASA81, Coumadin, Aten25 (if BP>110), Losartan100, Lasix40; BP= 110/60 & he presented w/ incr left leg swelling after fall; Renal function sl worse w/ Cr=2.0 & rec to decr Losartan100=>50 ~  11/15: on ASA81, Coumadin, Losar50, Lasix40; BP= 130/72 & he denies CP, palpit etc; followed by DCherly Hensenfor Cards- last seen 12/14, stable- no changes made & he is due for f/u visit....Marland KitchenMarland KitchenMarland Kitchen PERIPHERAL VASCULAR DISEASE (ICD-443.9) - back on ASA '81mg'$ /d...  ~  he is s/p infrarenal AAA repair w/ right common iliac aneurysm repair (via Ao Bi-iliac graft) in 1998 by DrLawson; also has a known desc thor AA measuring ~5+cm and followed by DrGearhart...  ~  seen by DrGearhart 4/10 w/ CT scan showing ~5cm thoracoabdominal aneurysm w/o signif change...  ~  seen by DrGearhart 2/11 in hosp w/ sl incr size of AAA- f/u planned 672mo~  also followed by PV- DrCooper. ~  seen by DrGearhardt 10/11 & stable, no change, f/u 1 yr. ~  Seen 12/12 by DrGearhardt & CTscan is stable, continue conservative approach... ~  2/13:  [facility] Hess... ~  4/13:  He had a fenestrated end[facility]asc [facility] epid[facility] (on Coumadin) w/ paraplegia requiring readmission for laminectomy & evac of  the hematomas;   He was sent to [facility] for rehab 4/18 - 01/30/12 and then disch to [facility] but only spent 4d there before he had to be readmit to [facility] 5/11 - 02/09/12 by [facility] w/ altered mental status, pneumonia, AFib w/ rvr, & FTT> Neuro w/u was otherw neg (& he improved w/ supportive Hess)... ~  2/14:  CT Chest, Abdomen, Pelvis> per Ryan Hess - see report (the abdominal component has enlarged)... 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 DrFarbers note indicates that to finish the repair he will require 3 branches and a left renal fenestration, they quoted him a risk of paraplegia in the 10-15% range & he will need preadmission for a lumbar drain to be placed, etc; he is awaiting the call from [facility]... ~  7/14: s/p surg at [facility] by Ryan Hess w/ 5 stents placed Hess in a >4h operation w/ special spinal precautions- he developed a blood clot in his left arm w/ some persist swelling; he has reg f/u at [facility]... ~  He maintains a regular sched of f/u visits at [facility] clinic w/ XRays, CT scans, dopplers, etc on a regular basis- results reviewed in Hess Everywhere section of Epic...  VENOUS INSUFFICIENCY, EDEMA >>  ~  He has chronic venous insuffic w/ mild chronic edema maintained on a low sodium diet, elevation, support hose when nec, & LASIX'40mg'$ /d... ~  7/15:  Presented 2wks after fall at home, left leg red/ swollen/ sl tender, no broken skin/ drainage/ not hot etc; VenDopplers showed NEG for DVT & Protime was too thin... ~  11/15: edema resolved and back to baseline...  HYPERCHOLESTEROLEMIA (ICD-272.0) - on LIPITOR '10mg'$ /d...  ~  [place] 5/07 showed TChol 115, Tg 58, HDL 36, LDL 67 ~  FLP 5/08 showed TChol 118, TG 66, HDL 31, LDL 74 ~   FLP 5/09 showed TChol 126, TG 71, HDL 37, LDL 75 ~  FLP 6/10 showed TChol 116, TG 73, HDL 40, LDL 62 ~  2/11:  FLP not checked during the hospitalization... ~  FLP 4/11 in hosp showed TChol 94, TG 52, HDL 23, LDL 61 ~  FLP 12/12 on Lip10 showed TChol 107, TG 38, HDL 40, LDL 60 ~  FLP 3/16 on Lip10 showed TChol 133, TG 82, HDL 43, LDL 74  HYPOTHYROIDISM >> he remains clinically euthyroid... ~  Labs 12/12 showed TSH= 6.10 ~  Labs 6/13 showed TSH= 6.66 ~  Labs 5/14 showed TSH= 6.21... We decided to start SYNTHROID 32mg/d... ~  Labs 5/15 on Levothy50 showed TSH= 1.92 ~  Labs 3/16 on Synthroid50 showed TSH= 2.82  DIVERTICULOSIS OF COLON (ICD-562.10) & COLONIC POLYPS (ICD-211.3) - hx polyps in 2003 = tubular adenoma... ~  last colonoscopy 9/06 showed divertics, hems, no polyps. ~  Pt followed by Ryan Hess & they decided to forgo any further GI procedures/ colonoscopies...  ISCHEMIC BOWEL >> GMikhaipresented to the ER 7/23 - 04/25/12 w/ abd pain- ELap surg revealed ~5 feet of frankly ischemic small bowel, likely due to internal hernia and closed loop obstruction=> Bowel resected & pulses intact w/ no evidence of embolic or thrombotic pathology;  Viable small bowel anastomosed & wound vac applied; he had a stable post op course..Marland KitchenMarland Kitchen  RENAL INSUFFICIENCY >>  PROSTATE CANCER (ICD-185) - eval by Ryan Hess and they decided on XRT by DrKinard- finished 5/09 and he states that DrKinard "released me"... he sees Ryan Hess every 6 months & they follow PSA closely (notes reviewed)... ~  8/12: f/u prostate cancer dx in 2009 & treated w/ XRT completed 6/09; slowly rising PSA w/ doubling time 6-51mo min voiding symptoms, he is considering androgen ablation if needed... ~  6/13: He developed hematuria & urinary retention requiring Urology eval, discontinuation of Coumadin, & Foley placement=> subseq removed 8/13 w/ adeq voiding trial... ~  3/14:  He had f/u visit w/ Ryan Hess- PSA nadir after XRT was <0.04 and it has  climbed to 0.54 (3/14) w/ PSADT of ~174yrthey chose to continue watchful waiting... ~  1/15:  Ryan Hess reports that PSA is up to 1.46 ~  Labs 5/15 on Lasix40 showed Cr=1.6, BNP=184 ~  7/15: he had f/u w/ Ryan Hess> BPH w/ BOO, Prostate ca; on Flomax0.4, PSA up to 1.84, neg bone scan x degen changes; they continue to follow w/ watchful waiting, ROV in 60m43mo ~  He maintains regular f/u w/ Ryan Hess> we do not have recent clinic notes but pt indicates that everthing is ok... ~  3/16: [facility] & reviewed in [place] thickening & trabeculation of the bladder wall & prox ascending bowel wall thickening... NOTE> they rec f/u w/ Urology & records sent in light of these findings... ~  Labs 3/16 showed BUN=32, Cr= 1.85...  DEGENERATIVE JOINT DISEASE (ICD-715.90) - s/p bilat TKR's, [facility]- DrOlin... He has OXY-IR '5mg'$  as needed for pain...  MEMORY LOSS, SENILE DEMENTIA, Vasc disease/ infarcts on MRI/ global atrophy >> he tried [name] in the past & they stopped it... 05/2016> he's been sundowning & we prescribed Seroquel '25mg'$ - 1/2 to 1 tab as needed...   ACTINIC SKIN DAMAGE (ICD-692.70) - followed by DrHBrunetta Jeanse knows to avoid sun exposure, use sun screen, etc...  ANEMIA >> ~  Labs 6/13 showed Hg= 8.4, Fe= 31 (11%sat); Rec to start FeSO4 Bid... ~  Labs 7/13 in hosp showed Hg= 12.6=>8.2 at disch... ~  Labs 8/13 showed Hg= 9.5, MCV= 94; rec to continue FeSO4 Bid... ~  Labs 10/13 showed Hg= 13.2 & ok to wean Fe to 1/d til gone then stop... ~  Labs 5/14 showed Hg= 14.2 ~  Labs 5/15 showed Hg= 13.2 ~  Labs 3/16 showed Hg= 14.4   Past Surgical History:  Procedure Laterality Date  . ABDOMINAL AORTIC ANEURYSM REPAIR  1998  . APPENDECTOMY    . APPLICATION OF WOUND VAC  04/18/2012   Procedure: APPLICATION OF WOUND VAC;  Surgeon: ThoJoyice Fasterornett, MD;  [facility];  Laterality: N/A;  Removal of abdominal wound vac, Application of incisional   wound vac  . INGUINAL HERNIA REPAIR     left  . JOINT REPLACEMENT    . LAPAROTOMY  04/16/2012   Procedure: EXPLORATORY LAPAROTOMY;  Surgeon: BriMadilyn HookO;  [facility];  Laterality: N/A;  exploratory Laparotomy,Small bowel ressection abdominal wound vac placement.  . LMarland KitchenPAROTOMY  04/18/2012   Procedure: EXPLORATORY LAPAROTOMY;  Surgeon: ThoJoyice Fasterornett, MD;  [facility] [place] [place];  Laterality: N/A;  exploratory laparotomy with small bowel anastomosis  . repair of AA  04/14/2013   [place]     bilateral    Outpatient Encounter Prescriptions as of 07/05/2016  Medication Sig Dispense Refill  . atorvastatin (LIPITOR) 10 MG tablet TAKE 1 TABLET BY MOUTH DAILY (Patient taking differently: TAKE 1 TABLET (10 mg) BY MOUTH DAILY) 30 tablet 11  . Fluticasone-Salmeterol (ADVAIR DISKUS) 250-50 MCG/DOSE AEPB inhale 1 dose by mouth twice a day (Patient taking differently: Inhale 1 puff into the lungs 2 (two) times daily. ) 180 each 3  . guaiFENesin (MUCINEX) 600 MG 12 hr tablet Take 600 mg by mouth 2 (two) times daily. COPD.    [name] levothyroxine (SYNTHROID, LEVOTHROID) 50 MCG tablet TAKE 1 TABLET BY MOUTH DAILY 90 tablet 0  . metoprolol succinate (TOPROL XL) 25 MG 24 hr tablet Take 1 tablet (25 mg total) by mouth daily. 90 tablet 3  . Multiple Vitamin (MULTIVITAMIN) tablet Take 1 tablet by mouth daily.      [name] neomycin-polymyxin b-dexamethasone (MAXITROL) 3.5-10000-0.1 OINT Place 1 application into both eyes as needed (for eye irriation or itching).    . QUEtiapine (SEROQUEL) 25 MG tablet Take 1 tablet (25 mg total) by mouth at bedtime. 10 tablet 0  . tiotropium (SPIRIVA HANDIHALER) 18 MCG inhalation capsule USE 2 INHALATIONS FROM ONE CAPSULE DAILY AS DIRECTED (Patient taking differently: Place 18 mcg into inhaler and inhale. USE 2 INHALATIONS FROM ONE CAPSULE DAILY AS DIRECTED) 90 capsule 1  . traMADol (ULTRAM) 50 MG tablet Take 1  tablet (50 mg total) by mouth 3 (three) times daily as needed. for pain 90 tablet 0   No facility-administered encounter medications on file as of 07/05/2016.     No Known Allergies   Current Medications, Allergies, Past Medical History, Past Surgical History, Family History, and Social History were reviewed in [employer] record.   Review of Systems        See HPI - all other systems neg except as noted...  The patient complains of dyspnea on exertion.  The patient denies anorexia, fever, weight loss, weight gain, vision loss, decreased hearing, hoarseness, chest pain, syncope, peripheral edema, prolonged cough, headaches, hemoptysis, abdominal pain, melena, hematochezia, severe indigestion/heartburn, hematuria, incontinence, muscle weakness, suspicious skin lesions, transient blindness, difficulty walking, depression, unusual weight change, abnormal bleeding, enlarged lymph nodes, and angioedema.     Objective:   Physical Exam    WD, WN, 82 y/o WM in NAD... GENERAL:  Alert & oriented; pleasant & cooperative; pale complexion... HEENT:  [place]/AT, EOM-wnl, PERRLA, EACs-clear, TMs-wnl, NOSE-clear, THROAT-clear & wnl. NECK:  Supple w/ fairROM; no JVD; normal carotid impulses w/o bruits; no thyromegaly or nodules palpated; no lymphadenopathy. CHEST:  decr BS bilat, scat bibasilar rales, w/o wheezing/ rhonchi/ signs of consolidation. HEART:  irregular rhythm, gr1/6 diast murmur in Ao area, without rubs or gallops detected... ABDOMEN:  Soft & nontender; normal bowel sounds; no organomegaly or masses palpated... EXT: s/p bilat TKR's, mod arthritic changes; +venous insuffic, left leg swelling/ red > right leg, no drainage NEURO:  CN's intact;  gait abn; no focal neuro deficits... DERM:  No lesions noted; no rash etc...  RADIOLOGY DATA:  Reviewed in the EPIC EMR & discussed w/ the patient...  LABORATORY DATA:  Reviewed in the EPIC EMR & discussed w/ the  patient...   Assessment:      COPD/Emphysema, Hx Pneumonia>  baseline severe COPD/Emphysema w/ Cardiomeg, Aneurysm & bibasilar fibrosis, stable on Advair250, Spiriva, Mucinex...  HBP>  HBP, CHF, AFib> Coumadin on HOLD after GIB, off ASA81, off Losar25, on MetopER25, & Lasix40 on HOLD; BP= 124/88 range & they note that Ryan Hess doesn't want it too  low due to his atherosclerosis & the endograft; he [facility] saw [facility] 12/2015...  CAD/ CHF/ etc>  Followed by Cherly Hensen & his prev notes are reviewed;  BNP 12/14 was up to 4200 & on Lasix40 it has improved to 184 by 5/15; 3/17 it measures 462 & reminded low sodium...Marland KitchenMarland KitchenMarland Kitchen  AFib>  On above + Coumadin followed in the [facility] but Protime became supratherapeutic 05/2016 causing GIB, AKI, etc; Coumadin on hold now... 07/05/16>  We discussed restart Coumadin w/ reduced INR goal ~2.0  Periph Vasc Dis>  S/p AAA repair 1998 by Sheryn Bison; then followed by DrGearhardt for thoraco-abd aneurysm & referred to [facility] for stent graft surg- done 4/13 by Ryan Hess w/ complications, and 2nd stage of the surgery 7/14 w/ 5 stents placed per Ryan Hess... They continue to follow regularly w/ CT scans every 36mo..  Ven Insuffic & Edema>  Prev improved w/ Lasix40/d;  He knows to elim salt, elev legs, wear support hose, etc;  He fell 7/15 w/ bruise reported, then followed by redness in skin & incr left leg swelling=> VenDoppler is neg for DVT & Protime was too thin=> adjusted; swelling resolved over time & back to baseline...  CHOL>  FLP looks good on Lip10...  Subclinical Hypothyroid>  TSH was 6.21 and Synthroid50 started 5/14=> TSH improved to 2.82...  GI> Presbyesoph, Divertics, Hx polpyps>  Stable & followed by Ryan Hess... 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 05/2016>  ADM w/ GIB due to INR=6.7; seen by DrSchooler, no intervention; treated w/ PPI but not disch on  one....Marland KitchenMarland KitchenMarland Kitchen Prostate Cancer>  Followed by Ryan Hess  w/ slowly rising PSA as noted; then developed urinary retention after lumbar surg and hematuria after foley & coumadin; now back to baseline & being followed... 9/16> there is a [facility] given to pt to take to f/u appt w/ Ryan Hess... 9/17> he is followed by Ryan Hess- hx prostate Ca w/ prev XRT 2009 & PSA recurrence (PSA=2.8 [id number] nadir <0.04); Hosp w/ GIB, had UTI w/ Klebs, urinary retention, & foley placed, outpt management by Urology...  DJD>  Followed by DrOlin, s/p bilat TKRs...  ANEMIA>  He developed acute blood loss anemia w/ post op Hg= 11-12 but dropped to 8.6 w/ hematuria & INR=4.2; Coumadin & ASA placed on HOLD; f/u labs showed Hg=8.4 & Fe=31 & started on FeSO4 Bid;  Then Hg=9.5 & rec to continue Fe Bid;  Cards restarted his Coumadin;  Now Hg= 14.4  05/2016> he was HOSP by [facility] w/ GIB/melena, INR=6.7, Hg=8.1, low Fe- given 2u Tx & rec for OTC Fe...  Actinic skin changes>  Aware, he is monitored by Derm...     Plan:     Patient's Medications  New Prescriptions             COUMADIN >> to be restarted by the Coumadin Clinic w/ INR goal ~2.0..Marland KitchenMarland Kitchen            FeSO4 '325mg'$  one tab daily   Previous Medications   ATORVASTATIN (LIPITOR) 10 MG TABLET    TAKE 1 TABLET BY MOUTH DAILY   FLUTICASONE-SALMETEROL (ADVAIR DISKUS) 250-50 MCG/DOSE AEPB    inhale 1 dose by mouth twice a day   GUAIFENESIN (MUCINEX) 600 MG 12 HR TABLET    Take 600 mg by mouth 2 (two) times daily. COPD.   LEVOTHYROXINE (SYNTHROID, LEVOTHROID) 50 MCG TABLET  TAKE 1 TABLET BY MOUTH DAILY   METOPROLOL SUCCINATE (TOPROL XL) 25 MG 24 HR TABLET    Take 1 tablet (25 mg total) by mouth daily.   MULTIPLE VITAMIN (MULTIVITAMIN) TABLET    Take 1 tablet by mouth daily.     NEOMYCIN-POLYMYXIN B-DEXAMETHASONE (MAXITROL) 3.5-10000-0.1 OINT    Place 1 application into both eyes as needed (for eye irriation or itching).   QUETIAPINE (SEROQUEL) 25  MG TABLET    Take 1 tablet (25 mg total) by mouth at bedtime.   TIOTROPIUM (SPIRIVA HANDIHALER) 18 MCG INHALATION CAPSULE    USE 2 INHALATIONS FROM ONE CAPSULE DAILY AS DIRECTED   TRAMADOL (ULTRAM) 50 MG TABLET    Take 1 tablet (50 mg total) by mouth 3 (three) times daily as needed. for pain  Modified Medications   No medications on file  Discontinued Medications   No medications on file

## 2016-07-07 NOTE — Telephone Encounter (Signed)
Dr Lenna Gilford calls today to inform us that he saw pt today. Pt. was recently admitted from 9/23 to 10/2 for GIB with elevated INR in Coumadin Clinic prior to admission. Coumadin was discontinued at discharge. Dr Lenna Gilford  would like Coumadin restarted today  with INR goal around 2.0 until pt is seen and elevated by Cardiology. Pt has a pending appt on 07/13/16 with L. Randlett, [place]. Coumadin restarted at 1mg s QD except 0.5mg s on Tuesday, Thursday and Saturday and INR check on 07/13/16 after cardiology appt.

## 2016-07-07 NOTE — Telephone Encounter (Signed)
Patient's son calling to get an earlier appointment with Dr. Johnsie Cancel. Patient's son stated patient's PCP wanted him to follow-up this month not in November. Scheduled patient with Cecilie Kicks NP next week.

## 2016-07-07 NOTE — Telephone Encounter (Signed)
New message       Talk to the nurse to give update on patient's health.

## 2016-07-10 ENCOUNTER — Other Ambulatory Visit: Payer: Self-pay | Admitting: Pulmonary Disease

## 2016-07-10 ENCOUNTER — Telehealth: Payer: Self-pay | Admitting: Pulmonary Disease

## 2016-07-10 MED ORDER — QUETIAPINE FUMARATE 25 MG PO TABS: 25.0000 mg | ORAL_TABLET | Freq: Every day | ORAL | 5 refills | Status: AC

## 2016-07-10 NOTE — Telephone Encounter (Signed)
Called and spoke with pts son and he is aware of refill of medication that has been sent to the pharmacy.

## 2016-07-11 ENCOUNTER — Telehealth: Payer: Self-pay | Admitting: Pulmonary Disease

## 2016-07-11 MED ORDER — FUROSEMIDE 40 MG PO TABS: ORAL_TABLET | ORAL | 5 refills | Status: DC

## 2016-07-11 NOTE — Telephone Encounter (Signed)
Ryan Hess returning call.Ryan Hess

## 2016-07-11 NOTE — Telephone Encounter (Signed)
Attempted to contact [place] for [facility], [facility], [facility] Left message for her to call back.

## 2016-07-11 NOTE — Telephone Encounter (Signed)
Beth, patient's [facility] PT nurse, states that patient was a lot more short of breath today with minimal exertion compared to Thursday. Patient's son removed his compression stockings a few days ago due to feeling it was cutting off circulation.  Vitals were good, O2 sats were 90% at rest.  Had a hard time getting SATS on patient, but she feels the O2 was probably lower than 90% when walking only 80 feet.  Son states that he has not gotten his weight lately.  Legs are swelling (approx. 2) without compression stockings on. Beth wanted to make sure that Dr. Lenna Gilford was aware.   FYI to Dr. Lenna Gilford.

## 2016-07-11 NOTE — Telephone Encounter (Signed)
She states son has not been able to get a weight on him in a couple of days either. She states significant change since last Thursday and wanted to let Dr. Lenna Gilford know this.  Has been taking all medications.

## 2016-07-11 NOTE — Telephone Encounter (Signed)
Per SN---  No salt Keep legs elevated Use compression stockings, if not able to get these back on then can use ace wraps rx for lasix 40 mg  2 po every am x 3 days---keep appt with Cardiology on Thursday and call for any further recs.    Called and spoke with pts son and he is aware of SN recs.  He will call and give update on pt in a few days.

## 2016-07-12 ENCOUNTER — Encounter: Payer: Self-pay | Admitting: Cardiology

## 2016-07-13 ENCOUNTER — Ambulatory Visit (INDEPENDENT_AMBULATORY_CARE_PROVIDER_SITE_OTHER): Payer: Medicare Other | Admitting: Cardiology

## 2016-07-13 ENCOUNTER — Encounter (INDEPENDENT_AMBULATORY_CARE_PROVIDER_SITE_OTHER): Payer: Self-pay

## 2016-07-13 ENCOUNTER — Ambulatory Visit (INDEPENDENT_AMBULATORY_CARE_PROVIDER_SITE_OTHER): Payer: Medicare Other | Admitting: Pharmacist

## 2016-07-13 ENCOUNTER — Encounter: Payer: Self-pay | Admitting: Cardiology

## 2016-07-13 VITALS — BP 144/70 | HR 104 | Ht 69.0 in | Wt 175.0 lb

## 2016-07-13 DIAGNOSIS — K922 Gastrointestinal hemorrhage, unspecified: Secondary | ICD-10-CM | POA: Diagnosis not present

## 2016-07-13 DIAGNOSIS — I4821 Permanent atrial fibrillation: Secondary | ICD-10-CM

## 2016-07-13 DIAGNOSIS — I493 Ventricular premature depolarization: Secondary | ICD-10-CM

## 2016-07-13 DIAGNOSIS — I4891 Unspecified atrial fibrillation: Secondary | ICD-10-CM

## 2016-07-13 DIAGNOSIS — I482 Chronic atrial fibrillation: Secondary | ICD-10-CM

## 2016-07-13 DIAGNOSIS — I5022 Chronic systolic (congestive) heart failure: Secondary | ICD-10-CM

## 2016-07-13 DIAGNOSIS — I10 Essential (primary) hypertension: Secondary | ICD-10-CM

## 2016-07-13 DIAGNOSIS — N179 Acute kidney failure, unspecified: Secondary | ICD-10-CM

## 2016-07-13 LAB — CBC WITH DIFFERENTIAL/PLATELET
Basophils Absolute: 0 cells/uL (ref 0–200)
Basophils Relative: 0 %
Eosinophils Absolute: 214 cells/uL (ref 15–500)
Eosinophils Relative: 2 %
HCT: 32.3 % — ABNORMAL LOW (ref 38.5–50.0)
Hemoglobin: 10.6 g/dL — ABNORMAL LOW (ref 13.2–17.1)
Lymphocytes Relative: 8 %
Lymphs Abs: 856 cells/uL (ref 850–3900)
MCH: 31 pg (ref 27.0–33.0)
MCHC: 32.8 g/dL (ref 32.0–36.0)
MCV: 94.4 fL (ref 80.0–100.0)
MPV: 11.4 fL (ref 7.5–12.5)
Monocytes Absolute: 642 cells/uL (ref 200–950)
Monocytes Relative: 6 %
Neutro Abs: 8988 cells/uL — ABNORMAL HIGH (ref 1500–7800)
Neutrophils Relative %: 84 %
Platelets: 184 10*3/uL (ref 140–400)
RBC: 3.42 MIL/uL — ABNORMAL LOW (ref 4.20–5.80)
RDW: 15.3 % — ABNORMAL HIGH (ref 11.0–15.0)
WBC: 10.7 10*3/uL (ref 3.8–10.8)

## 2016-07-13 LAB — BASIC METABOLIC PANEL
BUN: 43 mg/dL — ABNORMAL HIGH (ref 7–25)
CO2: 16 mmol/L — ABNORMAL LOW (ref 20–31)
Calcium: 7.8 mg/dL — ABNORMAL LOW (ref 8.6–10.3)
Chloride: 114 mmol/L — ABNORMAL HIGH (ref 98–110)
Creat: 3.39 mg/dL — ABNORMAL HIGH (ref 0.70–1.11)
Glucose, Bld: 85 mg/dL (ref 65–99)
Potassium: 4.3 mmol/L (ref 3.5–5.3)
Sodium: 143 mmol/L (ref 135–146)

## 2016-07-13 LAB — POCT INR: INR: 1.6

## 2016-07-13 NOTE — Progress Notes (Signed)
Cardiology Office Note   Date:  07/13/2016   ID:  Ryan Hess 1923/12/12, MRN [id number]  PCP:  Noralee Space, MD  Cardiologist:  Dr. Johnsie Cancel    Chief Complaint  Patient presents with  . Hospitalization Follow-up    GI bleed, acute renal failure      History of Present Illness: Ryan Hess is a 82 y.o. male who presents for post hospitalization 06/17/16 to 06/26/16 for GI bleed with heme+ stools and acute renal failure, seen by Dr. Jimmy Footman for renal - does not wish to have dialysis and he is a DNR.     He has a history of aortic aneurysm repair by Dr. Servando Snare. He has residual descending thoracic aneurysm that was stented at [facility] in [place]  2013  After stent he subsequently had ischemic bowel which required emergent surgery with secondary closure. Had been back on coumadin.   Also  complicated by cord edema and he had to have a lumbar procedure. He has hypertension.  He tends towards bradycardia and has had occasional PVCs. Chronic afib on coumadin that has just been resumed since Friday for his a fib.   He had a Myoview study done March 24, 2008, which was nonischemic. EF was not calculated due to PVCs. His EF by echo on March 24, 2008, was 45-50%. We had him on low-dose atenolol and this seems to have settled him down. Echo 01/2016 with similar Ef 45-50% with mild to moderate AR and MR.    Pt presented with 3 weeks of melena that became consistent for 3 days prior.  hgb was 8.1, BP low.  Pt was transfused this admit 2 units  Now  H/H stable with visist to PCP, so coumadin was resumed no colonoscopy was done pt stopped bleeding and was stable.  His coumadin had been held until Friday.  Plan had been if recurrent bleeding then EGD.   He was seen by Dr. Jimmy Footman with renalfailure, pt had been on ibuprofen and losartan which were stopped.    Dr. Risa Grill saw in consult for hx of prostate cancer with radiation in 2009, this admit with renal ultrasound  mild left hydro with layered  debris in bladder, with acute on chronic kidney injury with Cr 5 from baseline ~2.  No intervention for mild hydronephrosis, pt with UTI treated.   Has one small kidney and the other mild hydro.  Slowly Cr improved with conservative treatment.  No further ACE/ARB/NSAIDS.  To be followed by PCP. Pt would follow up with urology.   Syncope after BM thought to be vasovagal.   Today pt is tired, he fell asleep at times in room.  His son is with him today.  His lasix was resumed due to SOB and edema.  80 mg daily -pre hospital he was on 40 mg daly.  His coumadin was also resumed.  Today INR 1.6 letting INR drift up.  No bleeding, we discuss with Dr. Johnsie Cancel need to decrease the range to 2-2.5.  Pt's wt at home is staying stable up and down 169-172 lbs.  He is SOB with any walking. Continues with 1+ edema. Continues in a fib, today rapid HR but has been here with coumadin clinic and this appointment.  No chest pain.    Past Medical History:  Diagnosis Date  . Actinic skin damage   . Atrial fibrillation ([place])   . CHF (congestive heart failure) ([place])   . Colon polyps   . COPD (chronic obstructive  pulmonary disease) ([place])   . Diverticulosis of colon   . DJD (degenerative joint disease)   . Drug therapy   . HTN (hypertension)   . Hypercholesteremia   . Hyperlipidemia   . Internal hemorrhoids   . Pneumonia, organism unspecified(486)   . Prostate cancer ([place])   . Pulmonary nodule   . PVC (premature ventricular contraction)   . PVD (peripheral vascular disease) ([place])   . Thoracic aortic aneurysm [facility])     Past Surgical History:  Procedure Laterality Date  . ABDOMINAL AORTIC ANEURYSM REPAIR  1998  . APPENDECTOMY    . APPLICATION OF WOUND VAC  04/18/2012   Procedure: APPLICATION OF WOUND VAC;  Surgeon: Joyice Faster. Cornett, MD;  Location: [facility];  Service: General;  Laterality: N/A;  Removal of abdominal wound vac, Application of incisional  wound vac  . INGUINAL HERNIA REPAIR     left  . JOINT  REPLACEMENT    . LAPAROTOMY  04/16/2012   Procedure: EXPLORATORY LAPAROTOMY;  Surgeon: Madilyn Hook, DO;  Location: [place];  Service: General;  Laterality: N/A;  exploratory Laparotomy,Small bowel ressection abdominal wound vac placement.  [name] LAPAROTOMY  04/18/2012   Procedure: EXPLORATORY LAPAROTOMY;  Surgeon: Joyice Faster. Cornett, MD;  Location: [place];  Service: General;  Laterality: N/A;  exploratory laparotomy with small bowel anastomosis  . repair of AA  04/14/2013   done at [facility]  . TOTAL KNEE ARTHROPLASTY     bilateral     Current Outpatient Prescriptions  Medication Sig Dispense Refill  . atorvastatin (LIPITOR) 10 MG tablet Take 10 mg by mouth daily.    . Fluticasone-Salmeterol (ADVAIR) 250-50 MCG/DOSE AEPB Inhale 1 puff into the lungs 2 (two) times daily.    . furosemide (LASIX) 40 MG tablet Take 2 tablets by mouth every morning 60 tablet 5  . guaiFENesin (MUCINEX) 600 MG 12 hr tablet Take 600 mg by mouth 2 (two) times daily. COPD.    [name] levothyroxine (SYNTHROID, LEVOTHROID) 50 MCG tablet TAKE 1 TABLET BY MOUTH DAILY 90 tablet 0  . metoprolol succinate (TOPROL XL) 25 MG 24 hr tablet Take 1 tablet (25 mg total) by mouth daily. 90 tablet 3  . Multiple Vitamin (MULTIVITAMIN) tablet Take 1 tablet by mouth daily.      [name] neomycin-polymyxin b-dexamethasone (MAXITROL) 3.5-10000-0.1 OINT Place 1 application into both eyes as needed (for eye irriation or itching).    . QUEtiapine (SEROQUEL) 25 MG tablet Take 1 tablet (25 mg total) by mouth at bedtime. 30 tablet 5  . tiotropium (SPIRIVA) 18 MCG inhalation capsule Place 18 mcg into inhaler and inhale daily.    . traMADol (ULTRAM) 50 MG tablet Take 1 tablet (50 mg total) by mouth 3 (three) times daily as needed. for pain 90 tablet 0  . warfarin (COUMADIN) 1 MG tablet Take 1 tablet (1 mg total) by mouth as directed. 30 tablet 2   No current facility-administered medications for this visit.     Allergies:   Review of patient's allergies  indicates no known allergies.    Social History:  The patient  reports that he quit smoking about 34 years ago. His smoking use included Cigarettes. He has a 88.00 pack-year smoking history. He has never used smokeless tobacco. He reports that he does not drink alcohol or use drugs.   Family History:  The patient's family history includes Alzheimer's disease in his sister; Cancer in his sister; Dementia in his mother; Diabetes in his brother; Heart disease in  his brother, brother, and sister; Obesity in his brother; Stroke in his father.    ROS:  General:no colds or fevers,  weight stable per home weights, + weakness, sleepy Skin:no rashes or ulcers HEENT:no blurred vision, no congestion CV:see HPI PUL:see HPI GI:no diarrhea constipation or melena, no indigestion GU:no hematuria, no dysuria MS:no joint pain, no claudication Neuro:no syncope, no lightheadedness Endo:no diabetes, no thyroid disease  Wt Readings from Last 3 Encounters:  07/13/16 175 lb (79.4 kg)  07/05/16 170 lb (77.1 kg)  06/29/16 174 lb (78.9 kg)     PHYSICAL EXAM: VS:  BP (!) 144/70   Pulse (!) 104   Ht '5\' 9"'$  (1.753 m)   Wt 175 lb (79.4 kg)   SpO2 97%   BMI 25.84 kg/m  , BMI Body mass index is 25.84 kg/m. General:Pleasant affect, NAD Skin:Warm and dry, brisk capillary refill HEENT:normocephalic, sclera clear, mucus membranes moist Neck:supple, no JVD, no bruits  Heart:S1S2 irreg irreg with [id number] systolic murmur, no gallup, rub or click Lungs:clear without rales, rhonchi, or wheezes but diminished breath sounds. [id number], non tender, + BS, do not palpate liver spleen or masses Ext:1+ lower ext edema,  2+ radial pulses Neuro:alert and oriented X 3, MAE, follows commands, + facial symmetry    EKG:  EKG is ordered today. The ekg ordered today demonstrates a fib with RVR and PVCs   Recent Labs: 12/14/2015: Pro B Natriuretic peptide (BNP) 462.0 06/19/2016: TSH 5.310 07/04/2016: ALT 6 07/13/2016: BUN 43;  Creat 3.39; Hemoglobin 10.6; Platelets 184; Potassium 4.3; Sodium 143    Lipid Panel    Component Value Date/Time   CHOL 120 12/14/2015 1219   TRIG 98.0 12/14/2015 1219   HDL 36.20 (L) 12/14/2015 1219   CHOLHDL 3 12/14/2015 1219   VLDL 19.6 12/14/2015 1219   LDLCALC 65 12/14/2015 1219       Other studies Reviewed: Additional studies/ records that were reviewed today include: Hospital notes. ECHO 02/02/16 Study Conclusions  - Left ventricle: The cavity size was normal. Wall thickness was   normal. Systolic function was mildly reduced. The estimated   ejection fraction was in the range of 45% to 55%, with   significant beat to beat variation. The study is not technically   sufficient to allow evaluation of LV diastolic function. - Aortic valve: Mildly calcified leaflets. There was no stenosis.   There was mild to moderate regurgitation. - Mitral valve: Calcified annulus. Mildly thickened leaflets .   There was mild regurgitation. - Left atrium: Severely dilated at 48 ml/m2. - Right atrium: The atrium was mildly dilated. - Tricuspid valve: There was mild regurgitation. - Pulmonary arteries: PA peak pressure: 31 mm Hg (S). - Inferior vena cava: The vessel was dilated. The respirophasic   diameter changes were blunted (< 50%), consistent with elevated   central venous pressure.  Impressions:  - Compared to a prior echo in 2013, the EF ranges from 45-55%, with   significant beat to beat variation due to a-fib and frequent   PVC&'s. There is mild to moderate AI, mild MR, severe LAE, mild   RAE, mild TR, RVSP 31 mmHg, dilated IVC.  ASSESSMENT AND PLAN:   Chronic afib: better rate control with atenolol with recent GI bleed requiring transfusion 2 units, coumadin resumed but will decrease goal range to 2.0-2.5 if Dr. Johnsie Cancel agrees. Currently no melena.   GI bleed - treated conservatively recheck CBC  HTN:  elevated here but other readings controlled.  Will monitor to  see PCP next week.No ACE or ARB with acute renal failure.  Chronic systolic HF lasix resumed-close follow up with BMP, will be difficult to prevent edema/SOB and not stress kidneys.  Acute Renal failure- improved at discharge but creeping upward with adding back lasix.  Still SOB with any exertion. Some lower ext edema.  Will continue lasix 80 for now but will check BMP today   COPD:  No active wheezing   Chol   RecentLabs       Lab Results  Component Value Date   [place] 65 12/14/2015      Vascular:  Post stenting of thoracic aneurysm f/u [facility]  PVC;s  stable EF without change.      Current medicines are reviewed with the patient today.  The patient Has no concerns regarding medicines.  The following changes have been made:  See above Labs/ tests ordered today include:see above  Disposition:   FU:  see above  Signed, Cecilie Kicks, NP  07/13/2016 4:51 PM    [facility] [place], [place], [place] Phone: [phone]; Fax: [phone]

## 2016-07-13 NOTE — Patient Instructions (Signed)
Medication Instructions:  Your physician recommends that you continue on your current medications as directed. Please refer to the Current Medication list given to you today.   Labwork: Your physician recommends that you have lab work today: bmet/cbc   Testing/Procedures: -None  Follow-Up: Your physician recommends that you keep your scheduled  follow-up appointment with Dr. Johnsie Cancel.   Any Other Special Instructions Will Be Listed Below (If Applicable).     If you need a refill on your cardiac medications before your next appointment, please call your pharmacy.

## 2016-07-14 ENCOUNTER — Telehealth: Payer: Self-pay | Admitting: *Deleted

## 2016-07-14 ENCOUNTER — Telehealth: Payer: Self-pay | Admitting: Pulmonary Disease

## 2016-07-14 DIAGNOSIS — I10 Essential (primary) hypertension: Secondary | ICD-10-CM

## 2016-07-14 MED ORDER — SODIUM BICARBONATE 650 MG PO TABS: ORAL_TABLET | ORAL | 5 refills | Status: AC

## 2016-07-14 NOTE — Telephone Encounter (Signed)
pts son called back and he is aware of lab results per SN.  He is aware of new med and this has been sent to the pharmacy. Nothing further is needed.

## 2016-07-14 NOTE — Telephone Encounter (Signed)
Per SN---  Call pt and son  SN reviewed his labs from 10/19 He was not discharged on sodium bicarb tablets and he needs to be taking 650 mg  2 po BID.  Send in #120 with prn refills.  I called and lmomtcb for terry to return my call to make him aware of SN recs.

## 2016-07-14 NOTE — Telephone Encounter (Signed)
Pt son , Coralyn Mark, [place] on file, has been made aware of pts lab results. He will bring pt by 07/17/16 for repeat bmet. Order in [place].  Pt son verbalized appreciation and understanding.

## 2016-07-14 NOTE — Telephone Encounter (Signed)
Pt was treated w/ BICARB tabs- 1300mg  tid throughout his HOSP but was NOT dicharged on this med... Since disch his BMet has shown a steady drop in his BICARB level to 16-- c/w metabolic acidosis from his renal failure => needs to restart the Bicarb tabs-- 650mg  each (he was taking 2 tabs Tid in hosp), we will start w/ 2tabs Bid for now & recheck his BMet... SMN

## 2016-07-14 NOTE — Telephone Encounter (Signed)
-----   Message from Isaiah Serge, NP sent at 07/13/2016  4:41 PM EDT ----- hgb is stable on the coumadin.  Kidney function is mildly increased recheck BMP on Monday .

## 2016-07-17 ENCOUNTER — Other Ambulatory Visit: Payer: Medicare Other | Admitting: *Deleted

## 2016-07-17 DIAGNOSIS — I10 Essential (primary) hypertension: Secondary | ICD-10-CM

## 2016-07-17 LAB — BASIC METABOLIC PANEL
BUN: 53 mg/dL — ABNORMAL HIGH (ref 7–25)
CO2: 21 mmol/L (ref 20–31)
Calcium: 7.7 mg/dL — ABNORMAL LOW (ref 8.6–10.3)
Chloride: 106 mmol/L (ref 98–110)
Creat: 3.54 mg/dL — ABNORMAL HIGH (ref 0.70–1.11)
Glucose, Bld: 89 mg/dL (ref 65–99)
Potassium: 3.1 mmol/L — ABNORMAL LOW (ref 3.5–5.3)
Sodium: 143 mmol/L (ref 135–146)

## 2016-07-18 ENCOUNTER — Telehealth: Payer: Self-pay | Admitting: Pulmonary Disease

## 2016-07-18 ENCOUNTER — Telehealth: Payer: Self-pay | Admitting: Cardiovascular Disease

## 2016-07-18 MED ORDER — FUROSEMIDE 40 MG PO TABS: ORAL_TABLET | ORAL | 1 refills | Status: DC

## 2016-07-18 NOTE — Telephone Encounter (Signed)
New message    Pt verbalized that he is calling to speak to the rn

## 2016-07-18 NOTE — Telephone Encounter (Signed)
Per SN---  Ok to extend the PT for the next 2- 4 weeks.  I have called and lmom to make her aware.

## 2016-07-18 NOTE — Telephone Encounter (Signed)
Pt son, Coralyn Mark, called back re: pt lab results. He will decrease lasix to 40 mg qd and have Dr. Lenna Gilford repeat kidney function.

## 2016-07-19 ENCOUNTER — Telehealth: Payer: Self-pay | Admitting: *Deleted

## 2016-07-19 ENCOUNTER — Encounter: Payer: Self-pay | Admitting: Pulmonary Disease

## 2016-07-19 ENCOUNTER — Ambulatory Visit (INDEPENDENT_AMBULATORY_CARE_PROVIDER_SITE_OTHER): Payer: Medicare Other | Admitting: Pulmonary Disease

## 2016-07-19 ENCOUNTER — Other Ambulatory Visit (INDEPENDENT_AMBULATORY_CARE_PROVIDER_SITE_OTHER): Payer: Medicare Other

## 2016-07-19 DIAGNOSIS — I4819 Other persistent atrial fibrillation: Secondary | ICD-10-CM

## 2016-07-19 DIAGNOSIS — I10 Essential (primary) hypertension: Secondary | ICD-10-CM

## 2016-07-19 DIAGNOSIS — N289 Disorder of kidney and ureter, unspecified: Secondary | ICD-10-CM

## 2016-07-19 DIAGNOSIS — I481 Persistent atrial fibrillation: Secondary | ICD-10-CM | POA: Diagnosis not present

## 2016-07-19 DIAGNOSIS — M159 Polyosteoarthritis, unspecified: Secondary | ICD-10-CM

## 2016-07-19 DIAGNOSIS — M150 Primary generalized (osteo)arthritis: Secondary | ICD-10-CM

## 2016-07-19 DIAGNOSIS — E039 Hypothyroidism, unspecified: Secondary | ICD-10-CM

## 2016-07-19 DIAGNOSIS — J4489 Other specified chronic obstructive pulmonary disease: Secondary | ICD-10-CM

## 2016-07-19 DIAGNOSIS — J449 Chronic obstructive pulmonary disease, unspecified: Secondary | ICD-10-CM

## 2016-07-19 DIAGNOSIS — I739 Peripheral vascular disease, unspecified: Secondary | ICD-10-CM

## 2016-07-19 DIAGNOSIS — I5022 Chronic systolic (congestive) heart failure: Secondary | ICD-10-CM | POA: Diagnosis not present

## 2016-07-19 LAB — BASIC METABOLIC PANEL
BUN: 68 mg/dL — ABNORMAL HIGH (ref 6–23)
CO2: 26 mEq/L (ref 19–32)
Calcium: 8.4 mg/dL (ref 8.4–10.5)
Chloride: 104 mEq/L (ref 96–112)
Creatinine, Ser: 3.73 mg/dL — ABNORMAL HIGH (ref 0.40–1.50)
GFR: 16.3 mL/min — ABNORMAL LOW (ref >60.00)
Glucose, Bld: 110 mg/dL — ABNORMAL HIGH (ref 70–99)
Potassium: 3.4 mEq/L — ABNORMAL LOW (ref 3.5–5.1)
Sodium: 146 mEq/L — ABNORMAL HIGH (ref 135–145)

## 2016-07-19 MED ORDER — POTASSIUM CHLORIDE ER 10 MEQ PO TBCR: EXTENDED_RELEASE_TABLET | ORAL | 1 refills | Status: AC

## 2016-07-19 NOTE — Patient Instructions (Signed)
Today we updated your med list in our EPIC system...    Continue your current medications the same...  Today we rechecked your Metabolic panel...  Call for any questions...  Let's plan a follow up visit in 2-3 weeks, sooner if needed for problems.Marland KitchenMarland Kitchen

## 2016-07-19 NOTE — Telephone Encounter (Signed)
-----   Message from Isaiah Serge, NP sent at 07/18/2016  7:53 PM EDT ----- Also have him take Kdur 20 meq once and then 10 meq daily.

## 2016-07-19 NOTE — Telephone Encounter (Signed)
Pt son, Coralyn Mark, [place] on file, has been made aware that pt needs to start Potassium 10 meq with taking 2 on day 1 and 1 daily thereafter. He verbalized understanding. Rx sent to [facility], per sons request.

## 2016-07-19 NOTE — Progress Notes (Signed)
Subjective:     Patient ID: Ryan Hess, male   DOB: Apr 16, 1924, 82 y.o.   MRN: [id number]  HPI 82 y/o WM here for a follow up visit... he has multiple medical problems as noted below...   ~  SEE PREV EPIC NOTES FOR OLDER DATA >>     5/14:  Ryan Hess is awaiting a call from Ryan Hess at [facility] about the next stage in his planned thoracoabd aneurysm repair- he had his situation reviewed by Ryan Hess here & he rec f/u at [facility] per the original plan since this needs to be done endovascularly & is quite complicated...   LABS 5/14:  Chems- ok w/ stable mild RI (Cr=1.5);  CBC- wnl w/ Hg=14.2;  TSH=6.21 (Synthroid50 started);  BNP=263;  Protimes adjusted by the CC...   Ryan Hess has had his f/u surg at [facility] by [employer] w/ 5 stents placed 04/15/13 in a >4h operation w/ special spinal precautions; he developed a blood clot in his left arm w/ some persist swelling...  CXR 10/14 showed chr changes of Ao stent graft, stable cardiomegaly, no infiltrates, NAD...   LABS 10/14:  CBC- wnl;  Protimes followed in CC...    LABS 5/15:  Chems- ok x BUN=26 Cr=1.6;  CBC- wnl w/ Hg=13.2;  TSH=1.92 on Levothy50;  BNP=184...   LABS 7/15:  Chems- ok x BUN=33 Cr=2.0;  CBC- ok w/ Hg=12.2 WBC=6.9;  Protime=49sec, INR=4.8;  Sed=37  D-Dimer=2.50  CRP=4.4  VenDopplers 7/15 showed NEG for DVT...   ~  December 14, 2014:  67moROV & [name] a follow up [facility] and [facility] [place] [facility]> see details below but everything stable and no med changes required:  CXR 1/16 revealed stable thoracoabdominal aortic endograft- no change, stable heart size, COPD/Emphysema & mild right basilar atelectasis, NAD...  Abd XRay 1/16 showed 4-vessel fenestrated thoracoabdominal aortic endograft w/ branch stents in celiac/ SMA/ & bilat renal arts- all unchanged, extensive DDD in spine, L4 compression- all unchanged...   Renal/ Mesenteric Ultrasound 1/16> no evid of hemodynamically signif RAS  noted bilat, right renal cyst, distal SMA stent/ mid & distal SMA were normal, celiac & prox SMA stent not visualized...   CT Chest/ Abd/ Pelvis 1/16 w/o contrast> stable to sl decr in size of the mid thoracic Ao aneurysm, stable appearance of the 4-vessel fenestrated thoracoabdominal endograft; thickening & trabeculation of the bladder wall & prox ascending bowel wall thickening... NOTE> they rec f/u w/ Urology & GI in light of these findings... Ryan Hess [name] he states he is feeling well, now 82y/o, and denies CP, palpit, dizzy, change is dyspnea, & no edema; he is able to walk to the mailbox slowly & does ok- this is stable; he notes min cough/ beige sput/ no hemoptysis or f/c/s; he notes that he is a little more forgetful & son confirms some progressive decline in memory- they are ready to try [name] Rx (start w/ 521md 7 incr to 1058m)...     COPD, HxPneumonia> on Advair250Bid & Spiriva daily, plus Mucinex600Bid; denies resp exac- continue same, avoid infections...    Cards- HBP, CHF, AFib> on Coumadin via CC, ASA81, Losar100, Lasix40; BP= 122/60 & they note that Ryan Hess doesn't want it too low due to his atherosclerosis & the endograft; he is overdue for Cards follow up visit...     Peripheral Vasc Dis> see above...    Hyperlipidemia> on Atorva10; FLP today  showed TChol 133, TG 82, HDL 43, LDL 74... continue same Rx...    Hypothyroid> on Synthroid50; clinically euthyroid & Labs 3/16 showed TSH= 2.82...    GI- HxDivertics, Polyps, ischemic bowel> see above; CT Abd&Pelvis w/o contrast 1/16 at [facility] w/ focal thickening in bowel wall in Prox asc colon; he saw Ryan Hess last 2012 & they decided to forgo any f/y colonoscopies for the obvious reasons; we will follow clinically- watch blood counts and stools, Ryan Hess plans f/u CT scans in another 93mo..     GU- prostate cancer and RI> he is followed by DrWrenn for Urology & seen several weeks ago by his hx (we do  not have notes from them); pt reports everything was good & [facility] to Urologist's attn...    DJD> [name] to be stable, s/p bilat TKRs, on Tramadol & tylenol prn...    Anemia> Labs today showed Hg= 14.4, MCV= 94, all back to wnl... We reviewed prob list, meds, xrays and labs> see below for updates >>   LABS 3/16:  FLP- at goals on Atorva10;  Chems- ok w/ Cr improved to 1.85;  CBC- wnl;  TSH=2.82 on Synthroid50... PLAN>>  We decided to start [name] 5=>171md for his vascular dementia & memory loss;  He will f/u w/ Cards at his earliest convenience; we will [facility] & scan report to DrWrenn, Urology to review...   ~  June 16, 2015:  28m29moV & [name] that he is doing well- feeling good, min cough, no sput, no blood, stable SOB/DOE w/o change; he ambulates w/ a cane & needs more exercise, we discussed this- not having any CP, palpit, edema...  [facility] below-- no changes made & they are continuing 28mo 35mo; they did rec f/u by Urology due to CT scan findings and he has an upcoming appt w/ DrWrenn...   CXR 8/16> similar appearance to the thoracoabd endograft, lungs appear unchanged...   Abd film 8/16> similar appearance of the endograft w/o kink/ stenosis/ or other complic evident; degen changes in the spine...  Renal/ Mesenteric Ultrasound 8/16> no evid of renal art stenosis bilat w/ patent stents; norm hepatic & mesenteric art findings w/ patent SMA stent  CTA Chest/ Abd/ Pelvis 8/16> no change in appearance of the Aortic fenestrated endograft spaning the desc aorta to the infrarenal AA w/ stent limbs to the celiac art, SMA, bilat renal arts; 2 areas of aneurysmal dilatation- mid-thor sac measures 5.8cm (prev 5.5cm), & upper abd aneurysm sac measures 7.2cm (prev 7.5cm); apical scarring & severe centrilob & paraseptal emphysema, 1cm RUL nodule unchanged, no adenopathy; right renal cyst & irreg enhancing  post bladder wall thickening which is new; radiotherapy seeds in prostate; esoph is patulous w/ A/F level distally; DJD & L4 compression w/o change... COPY TO PT TO SHOW TO Dr. WrennJeffie PollockLABS at [facility]:  BUN=31, Cr=1.87 EXAM shows Afeb, VSS, O2sat=97% on RA;  HEENT- neg, mallampati1;  Chest- sl decr BS at bases w/ scat rhonchi, no w/r/consolidation;  Heart- irreg AFib, Gr1/6SEM w/o r/g;  Abd- soft, nontender;  Ext- w/o c/c/e;  Neuro- intact...    COPD, HxPneumonia> on Advair250Bid & Spiriva daily, plus Mucinex600Bid; denies resp exac- continue same, avoid infections...    Cards- HBP, CHF, AFib> on Coumadin via CC, ASA81, Losar50, Lasix40; BP= 116/50 & they note that Ryan Hess doesn't want it too low due to his atherosclerosis & the endograft;  he is overdue for Cards follow up visit...     Peripheral Vasc Dis> see above per Ryan Hess at [facility]...    Hyperlipidemia> on Atorva10; FLP 3/16 showed TChol 133, TG 82, HDL 43, LDL 74... continue same Rx...    Hypothyroid> on Synthroid50; clinically euthyroid & Labs 3/16 showed TSH= 2.82...    GI- HxDivertics, Polyps, ischemic bowel> see above; CT[facility]Pelvis 8/16 at [facility] looked ok; he saw Ryan Hess last 2012 & they decided to forgo any f/u colonoscopies; we will follow clinically- watch blood counts and stools...    GU- prostate cancer and RI> he is followed by DrWrenn for Urology & he has up coming appt to address the abn finding on CTA Abd/Pelvis w/ post bladder wall thickening...    DJD> Herron appears to be stable, s/p bilat TKRs, on Tramadol & Tylenol prn...    Anemia> Labs 3/16 showed Hg= 14.4, MCV= 94, all back to wnl...    Memory Loss> we had prev started [name]5=>10 but pt has stopped this med & they do not want to pursue this eval or Rx... IMP/PLAN>>  We sent a copy of his CT Chest/ Abd/ Pelvis from [facility] Everywhere report) with the pt to give to DrWrenn; he will continue current meds and incr exercise; overdue for Cards f/u & they will call; Given  2016 FLU shot today...  ~  December 14, 2015:  62moROV & Ozell reports that he is doing OK overall- he has developed a small skin ulser over his sacrum 7 we discussed avoiding pressure on this area (use donut), wash/ gauze/ vaseline or desitin/ etc, they will call if not resolving... He reports breathing is good- sl cough, sm amt yellow sput, no f/c/s, no CP & stable SOB/DOE (no change...  EXAM shows Afeb, VSS, O2sat=97% on RA;  HEENT- neg, mallampati1;  Chest- sl decr BS at bases w/ scat rhonchi, no w/r/consolidation;  Heart- irreg AFib, Gr1/6SEM w/o r/g;  Abd- soft, nontender;  Ext- w/o c/c/e;  Neuro- intact...    COPD, HxPneumonia> on Advair250Bid & Spiriva daily, plus Mucinex600Bid; denies resp exac- continue same, avoid infections...    Cards- HBP, CHF, AFib> on Coumadin via CC, ASA81, Aten25, Losar25, Lasix40; BP= 112/70 & they note that Ryan Hess doesn't want it too low due to his atherosclerosis & the endograft; he saw DrNishan 11/15/15-- HBP, AAA repair, [facility] on cou[facility]- he restarted Aten25 for nodal blockade..    Peripheral Vasc Dis> [facility] in Care Everywhere 04/2015 w/ CTA chest/abd/pelvis- note reviewed)...    Hyperlipidemia> on Atorva10; FLP 3/17 showed TChol 120, TG 98, HDL 36, LDL 65... continue same Rx...    Hypothyroid> on Synthroid50; clinically euthyroid but Labs 3/17 showed TSH= 6.98 so we rec incr Synthroid 7173m/d...    GI- HxDivertics, Polyps, ischemic bowel> see above; [facility] he saw Ryan Hess last 2012 & they decided to forgo any f/u colonoscopies; we will follow clinically- watch blood counts and stools...    GU- prostate cancer, RI, chr cystitis> he is followed by DrWrenn for Urology, abn finding on CTA Abd/Pelvis w/ post bladder wall thickening, last seen 10/22/15- cysto 06/2015 w/ inflamm in bladder but no mass, cyto neg, he's had hematuria & prev on 73m71moppressive Keflex for chr cystitis,  they are following closely...    DJD> GeoAbhinavpears to be stable, s/p bilat TKRs, LBP, etc; on Tramadol & Tylenol prn...Marland KitchenMarland Kitchen  Anemia> Labs 3/17 showed Hg= 14.6, MCV= 94, all back to wnl...    Memory Loss> we had prev started [name]5=>10 but pt has stopped this med & they do not want to pursue this eval or Rx...  EKG 11/15/15>  Chr AFib, rate114, LAD, PVC, NSSTTWA...  CXR 12/14/15>  Norm heart size, extensive Ao stent graft in place, coarse interstitial markings bilat- NAD= no infiltrate/ masses/ fluid/ etc...  LABS 12/14/15>  FLP-  At goals on Lip10;  Chems- ok x BUN=32, Cr=2.00, AlkPhos=235 & GGT=119 (7-51);  BNP=462 on Lasix40;  CBC- wnl w/ Hg=14.6, wbc=11.6;  TSH=6.98 IMP/PLAN>>  Davide is 83 y/o 7 remarkably stable for all he's been through- rec to continue current meds, continue regular follow up visits, avoid infections, stay active, & he will call for any problems in the interval...  ~  June 29, 2016:  71moROV & post hosp check>  [facility] due to melena, weakness, hypotension, AKI w/ BUN=141, Cr=5.39 (from his baseline of 32/2.0) , UTI w/ sens Klebs, and anemic w/ Hg=8.1 (from baseline~14), mcv=92, Fe=11 (7%sat), stool heme pos & his INR was 6.7 (Coumadin held by CC several days prior to adm);  Rx w/ VitK, 2uPCs, PPI, and Coumadin held;  GI consulted- they decided against endoscopic studies or further testing;  He had an unresponsive episode during the hosp (occured after a bowel movement & felt to be vagally mediated)- MRI Brain was NEG for acute abn;  Urology was consulted for mild hydronephrosis, hx prostate Ca w/ prev XRT 2009 & PSA recurrence (PSA=2.8 [id number] nadir <0.04)- foley placed & will be managed as outpt;  Nephrology also consulted- prev Losartan & Lasix were held; Klebs UTI treated w/ Cephalosporin x10d;  AKI (hypotension, GIB, meds, mild hydro) and CKD4- not a dialysis cand, he was treated w/ IVF, bicarb, may need Lasix restarted as outpt (no ACE,  ARB, NSAIDs)...    Since disch he remains weak, doesn't want to eat or do anything according to his son, agitated & confused Qhs & sun-downing;  Foley in- draining clear urine;  They have home health nurse checking on Pt... We decided to try a low dose SEROQUEL 282m1/2 to 1 Qhs for the sun-downing, agitation, etc & it worked well the first night, son will assess need daily... we reviewed & updated the following medical problems during today's office visit >>     COPD, HxPneumonia> on Advair250Bid & Spiriva daily, plus Mucinex600Bid; denies resp exac- continue same meds, avoid infections...    Cards- HBP, CHF, AFib> Hosp 05/2016 w/ INR=6.7, GIB, & AKI- Coumadin held/ reveresed, off Lasix, on MetopER25; BP= 124/88 & they note that Ryan Hess doesn't want BP too low due to his atherosclerosis & the endograft; he saw DrNishan 12/2015-- HBP on Aten25, AAA repair[facility] reviewed, no change in meds...    Peripheral Vasc Dis> [facility] w/ CTA chest/abd/pelvis- note reviewed) & they are to contact pt soon regarding future appts...    Hyperlipidemia> on Atorva10; FLP 3/17 showed TChol 120, TG 98, HDL 36, LDL 65... continue same Rx...    Hypothyroid> on Synthroid50; clinically euthyroid but Labs 3/17 showed TSH= 6.98 & pill count was way off, son to control his meds, still on 5057md w/ TSH 05/2016 = 5.31, continue same for now...    GI- HxDivertics, Polyps, hx ischemic bowel, GIB 05/2016  when PT/INR=6.7> see above; CTA Abd&Pelvis 8/16 at [facility] looked ok; he saw Ryan Hess last 2012 & they decided to forgo any f/u colonoscopies; GIB 05/2016 when INR=6.7 Hosp by [facility] & seen by DrSchooler, no interventions- Coumadin held, reveresed, transfused, etc; no abd pain, n/v, had mod stool burden, melena resolved...    GU- prostate cancer, chr cystitis, CKD4> followed by DrWrenn for Urology, abn finding on CTA Abd/Pelvis w/ post bladder  wall thickening, last seen 10/22/15- cysto 06/2015 w/ inflamm in bladder but no mass, cyto neg, he's had hematuria & prev on 5mosuppressive Keflex for chr cystitis;  ADM 05/2016 w/ GIB, INR=6.7, AKI w/ Cr=5.39, mild left hydro- Foley, IVF & Tx, etc & improved to Cr=4.39; followed by Urology & Neph DrPatel/Deterding not a dialysis cand.    DJD> GKobeyappears to be stable, s/p bilat TKRs, LBP, etc; on Tramadol & Tylenol prn...    Anemia> Labs 3/17 showed Hg= 14.6, MCV= 94; then 05/2016 ADM w/ GIB & INR=6.7, Hg was 8.1 w/ Fe=11 (7%), transfused 2u & needs longer term FeSO4...    Memory Loss, MRI w/ vasc dis, old infarcts, atrophy> we had prev started [name]5=>10 but pt has stopped this med & they do not want to pursue this eval or Rx... EXAM shows Afeb, VSS w/ BP=124/88, O2sat=100% on RA;  HEENT- neg, mallampati1;  Chest- sl decr BS at bases w/ scat rhonchi, no w/r/consolidation;  Heart- irreg AFib, Gr1/6SEM w/o r/g;  Abd- soft, nontender;  Ext- w/o c/c/e;  Neuro- intact w/o focal deficits...   CT Abd&Pelvis 06/17/16>  Lung bases show emphysema & patchy subpleural reticulation, coronary atherosclerosis; no acute intra-abd abn- mild sigm divertics w/o -itis, mod stool burden; Ao atherosclerosis w/ complex thoracoabd aneurysm s/p Ao-bi-iliac stent graft (maxdiameter 7.0 cm w/ celiac/ SMA/ bilat renal art stents in place); nonobstructing renal stone, cysts, bladder wall thickening, hepatic steatosis, L4 compression...   Abd Sonar 06/22/16>  Signs medical renal dis bilat, right renal cyst, foley in bladder, no hydroneph present...  MRI Brain 06/21/16>  No acute ischemia or hemorrhage, mod to severe ventriculomegaly & global parenchymal vol loss, old left cerebellar infarcts, old right thalamic lacunar infarct, attenuated left vertebral & basilar arteries, small vessel dis & ectatic ICAs,   EKG 06/17/16>  Afib, LAD, low voltage limb leads, NSSTTWA...   LABS 05/2016 hosp> reviewed in Epic, pertinent numbers above in  prob list... IMP/PLAN>>  Prob List updated w/ recent GIB, supratherapeutic INR, anemia, AKI, etc as above;  For now has Foley & appt w/ DrWrenn for f/u;  We will recheck labs regarding CBC, renal function, & decision regarding restart of Coumadin for his Afib & vasc dis;  He will also need f/u w/ Nephrology & Cardiology, son indicates too difficult [facility]... we plan short term recheck in 1 week.  ~  July 05, 2016:  1wk ROV recheck & don indicates that GRumaldois sl improved, got a good night's rest w/ Seroquel, although still up & down w/ the Foley cath- removed yest by DrWrenn, using depends, bowel movement wore him out, feels SOB/ weak/ but slowly improving- confusion is better, eating better,etc...  EXAM shows Afeb, VSS w/ BP=126/72, O2sat=98% on RA;  HEENT- neg, mallampati1;  Chest- sl decr BS at bases w/ scat rhonchi, no w/r/consolidation;  Heart- irreg AFib, Gr1/6SEM w/o r/g;  Abd- soft, nontender;  Ext- w/o c/c/e;  Neuro- intact w/o focal deficits...   LABS 06/2016 via office>  Chems- improved w/ BUN=42, Cr=3.27, Alb=3.1;  CBC- improved w/ Hg=10.5.Marland KitchenMarland Kitchen IMP/PLAN>>  Slowly improving w/ foley out, urology following, rec FeSO4 '325mg'$ /d, and we will call coumadin clinic to restart Coumadin but w/ INR target ~2.0;  He has Cards f/u w/ Cherly Hensen at end of the month...  ADDENDUM>>  He saw Cards NP 07/13/16-- post hosp check, noted acute on chr renal failure, consult by DrDeterding, pt declined dialysis & made a DNR; extensive Cards & PV diagnoses, Coumadin recently resumed for his AFib; he had been more SOB w/ incr edema & Lasix was doubled x3d to 80/d + ace wraps for legs; Labs 07/13/16 showed Chems- no ch w/ K=4.3, TCO2=16, BUN=43, Cr=3.39;  CBC- Hg=10.6, MCV=94, PT/INR=1.6 => no changes made... When I was noticied of labs & saw the met acidosis w/ HCO3=16-- I reviewed hosp records and noted that renal had him on BICARB tabs'650mg'$ -2Tid in [place] but not written at disch,  therefore we started HCO3 '650mg'$  tabs 2Bid for now...  ADDENDUM>>  Labs rechecked by Cards 07/19/16-- K=3.1, HCO3=21, BUN=53, Cr=3.54; they rec keep Lasix40/d, add K10/d...   ~  July 19, 2016:  2wk ROV & son Coralyn Mark notes that his edema diminished on the extra Lasix & ACE wrap; he is still quite marginal w/ his advancing renal failure & CHF as the rate-limiting problems; SEE PROB LIST ABOVE...     Underlying COPD on Advair250bid & Spiriva daily, plus Mucinex600Bid; breathing is baseline- mild chest congestion, denies cough/ sputum/ ch in SOB/DOE...    HBP, CHF, AFib, PVD, cerebrovasc dis> on Coumadin '1mg'$  via CC w/ INR goal ~2; last protime 10/26 w/ INR=2.6 & they kept same dose; on MetopER25, Lasix40, K10, NaHCO3-'650mg'$ -2Bid; he has f/u sched w/ Cards...    Medical issues> HL, Hypothy, GI, GU, DJD, Anemia-- on Lip10, Synth50, Seroquel25Qhs, Tramadol50 prn...  EXAM shows Afeb, VSS w/ BP=126/60, O2sat=93% on RA, Wt down to 156#; HEENT- neg, mallampati1;  Chest- sl decr BS at bases w/ scat rhonchi, no w/r/consolidation;  Heart- irreg AFib, Gr1/6SEM w/o r/g;  Abd- soft, nontender;  Ext- w/o c/c/ +tr edema;  Neuro- intact w/o focal deficits, weak, lethargy...   LABS 07/19/16>  K=3.4, HCO3=26, BUN=68, Cr=3.73 and rec to continue same meds, incr fluid intake, maintain f/u coumadin clinic & ROV recheck 2wks...  IMP/PLAN>>  Brewer is going downhill slowly; Hospice is on-board, supportive care/ comfort Rx; we will recheck in 2 wks...           Problem List:  COPD (ICD-496) Hx of PNEUMONIA, ORGANISM UNSPECIFIED (ICD-486) PULMONARY NODULE & CAVITY RLL (ICD-518.89) - he is an ex-smoker, having quit in [place] after 40 yrs of smoking... he was exercising regularly and walking daily ~57m 5-6 days per week... on ABlack Hammockdaily, +MUCINEX 2Bid w/ Fluids... ~  baseline CXR & CTChest w/ marked emphysema, atheromatous calcif, 5cm desc thorAA...  ~  CXR & CTA 2/11 in hosp showed cardiomeg + coronary calcif,  advanced atherosclerosis of Ao w/ aneuryms 5.8cm into abd, underlying emphysema w/ interst edema & bilat effusions, no PE... ~  4/11:  RLL pneumonia which was slow to clear... ~  6/11:  f/u CXR & CT Chest 6/11 w/ 6cm cavitary area RLL & nodular component inferiorly, +hilar adenopathy, no mediastinal nodes, etc; Tumor markers showed CEA=7.5 & Ca19-9= 10.8; > IR felt lesion was too hi risk for bx, therefore contin Rx & observe. ~  8/11:  f/u CXR w/o change in RLL opac (no worsening)>> repeat CEA=6.5; plan  f/u CT Chest in 49mo ~  10/11: f/u CT Chest showed interval decrease in size of RLL cavitary lesion & the inferiorly placed soft tissue component; otherw there is severe emphysema, cardiomeg, coronary calcif, thoracoabd aneurysm w/o change; there was an air-fluid level in a dilated esoph> check Ba Esophagram= Nonspecific esophageal motility disorder with very poor primary esophageal contractions (no HH, stricture, etc)... ~  12/12: f/u CTChest showed severe emphysema & scarring w/ bibasilar atx (no infiltrates no cavities), markedly tortuous & ectaticThorAo w/ extensive atherosclerotic changes & no change in decr thor aneurysm measuring 5.9 x 5.6cm, mild cardiomeg & dense coronary calcif as well... ~  4/13:  He had a fenestrated [facility]asc [facility] ~  subseq CXRs thru 7/13 hosp w/ cardiomeg, low lung vols, bibasilar airsp dis, etc... ~  CXR 1/14 showed cardiomeg, bibasilar fibrosis, thoracic aortic stent noted, NAD..[name] ~  7/14:  ?RUL pneumonia treated w/ Avelox & resolved=> pt [facility]  CXR 10/14 showed chr changes of Ao stent graft, stable cardiomegaly, no infiltrates, NAD..[name] ~  5/15: on Advair250, Spiriva, Mucinex; last CT 12/12 w/ severe emphysema & scarring, plus his extensive atherosclerotic dis/ ectatic thorAo/ aneurysm/ etc; his breathing is stable- mild cough, min sput, no hemoptysis, stable DOE, etc. ~  11/15: stable on  same meds... ~  [facility]- no change, stable heart size, COPD/Emphysema & mild right basilar atelectasis, NAD..Marland KitchenMarland Kitchen~  CT Chest ([facility]/ [facility]Pelvis[facility] decr in size of the mid thoracic Ao aneurysm, stable appearance of the 4-vessel fenestrated thoracoabdominal endograft... ~  3/16: on Advair250Bid & Spiriva daily, plus Mucinex600Bid; denies resp exac; [facility] stable & no changes made...    HYPERTENSION (ICD-401.9)                         << followed by DCherly Hensenfor Cards >> CONGESTIVE HEART FAILURE (ICD-428.0) ATRIAL FIBRILLATION (ICD-427.31) w/ clot in left atrial appendage >> resolved on f/u TEE, on COUMADIN per Cards & cardioverted. PREMATURE VENTRICULAR CONTRACTIONS, FREQUENT (ICD-427.69)  MEDS> prev on ATENOLOL '25mg'$ /d, & LASIX '20mg'$ -2AM + KCl 168mBid, and off Losartan... prev Norvasc discontinued & hx ACE cough in the past on Lisinopril. ~  NuclearStressTest 1/05 was neg- no ischemia or infarct (+diaphrag attenuation), EF=56%... ~  repeat Nuclear study 6/09 was neg- no ischemia, mild inferoapic thinning, not gated due to PVCs... ~  2DEcho 6/09 showed mild dilated LV w/ EF= 45-50% but no regional wall motion abn, mild AoV calcif & AI, LA mild dil... ~  12/10:  changed from [place] to L[place] save $$ ~  2/11:  in hosp- Cath= mild nonobstructive 3 vessel CAD, mod LVD w/ EF=40-45%; TEE= CHF w/ EF~45% w/ HK & left atrial appendage clot & AFib; he was diuresed & Norvasc stopped, placed on Coumadin w/ careful f/u by DrCherly Hensen the CC... ~  [facility] w/ [facility]Cs... ~  Recurrent AFib w/ RVR after his Thoracoabdominal stent graft 4/13... ~  6/13:  BP was low assoc w/ GU bleeding & ATENOLOL25 & LOSARTAN50 placed on HOLD... ~  COUMADIN on HOLD since GU bleeding & acute blood loss anemia; it has not yet been restarted... ~  8/13:  BP= 116/78 on LASIX '40mg'$ /d & K10-Bid;  Wt  down 14# to 173# ~  10/13:  BP= 114/82 on Lasix40+K1-Bid (he takes Aten25 if BP>110 at home)... ~  12/13: he had Cards f/u DrNishan> note reviewed, no change in meds, he referred him to DrGearhart for f/u stent as he did not want to ret to [facility]... ~  5/14: HBP, CHF, AFib w/ hx clot in LA appendage- resolved on Coumadin> on ASA81, Aten25, Lasix40, K10Bid; BP= 114/72 & he denies CP, palpit etc; followed by Cherly Hensen for Cards- his notes are reviewed, stable- no changes made. ~  8/14: he is post op extensive vasc surg at [facility] w/ 5 stents placed by Ryan Hess... ~  5/15: on ASA81, Coumadin, Aten25 (if BP>110), Losartan100, Lasix40; BP= 120/64 & he denies CP, palpit etc; followed by Cherly Hensen for Cards- his notes are reviewed, stable- BNP 5/15= 184... ~  7/15: on ASA81, Coumadin, Aten25 (if BP>110), Losartan100, Lasix40; BP= 110/60 & he presented w/ incr left leg swelling after fall; Renal function sl worse w/ Cr=2.0 & rec to decr Losartan100=>50 ~  11/15: on ASA81, Coumadin, Losar50, Lasix40; BP= 130/72 & he denies CP, palpit etc; followed by Cherly Hensen for Cards- last seen 12/14, stable- no changes made & he is due for f/u visit...Marland KitchenMarland KitchenMarland Kitchen  PERIPHERAL VASCULAR DISEASE (ICD-443.9) - back on ASA '81mg'$ /d...  ~  he is s/p infrarenal AAA repair w/ right common iliac aneurysm repair (via Ao Bi-iliac graft) in 1998 by DrLawson; also has a known desc thor AA measuring ~5+cm and followed by DrGearhart...  ~  seen by DrGearhart 4/10 w/ CT scan showing ~5cm thoracoabdominal aneurysm w/o signif change...  ~  seen by DrGearhart 2/11 in hosp w/ sl incr size of AAA- f/u planned 25mo ~  also followed by [facility]- DrCooper. ~  seen by DrGearhardt 10/11 & stable, no change, f/u 1 yr. ~  Seen 12/12 by DrGearhardt & CTscan is stable, continue conservative approach... ~  2/13:  [facility]... ~  4/13:  He had a fenestrated [facility]asc [facility] by  lumbar epidural hematomas (on Coumadin) w/ paraplegia requiring readmission for laminectomy & evac of the hematomas;  [facility] and then [facility]ch [facility] SNF [facility] but only spent 4d there [facility] w/ altered mental status, pneumonia, AFib w/ rvr, & FTT> Neuro w/u was otherw neg (& he improved w/ supportive care)... ~  2/14:  CT Chest, Abdomen, Pelvis> per Ryan Hess - see report (the abdominal component has enlarged)... 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 DrFarbers note indicates that to finish the repair he will require 3 branches and a left renal fenestration, they quoted him a risk of paraplegia in the 10-15% range & he will need preadmission for a lumbar drain to be placed, etc; [facility] ~  [facility] placed 04/15/13 in a >4h operation w/ special spinal precautions- he developed a blood clot in his left arm w/ some persist swelling; [facility] ~  He maintains a regular sched of f/u visits at  [facility] w/ XRays, CT scans, dopplers, etc on a regular basis- results reviewed in Care Everywhere section of Epic...  VENOUS INSUFFICIENCY, EDEMA >>  ~  He has chronic venous insuffic w/ mild chronic edema maintained on a low sodium diet, elevation, support hose when nec, & LASIX'40mg'$ /d... ~  7/15:  Presented 2wks after fall at home, left leg red/ swollen/ sl tender, no broken skin/ drainage/ not hot etc; VenDopplers showed NEG for DVT & Protime was too thin... ~  11/15: edema resolved and back to baseline...  HYPERCHOLESTEROLEMIA (ICD-272.0) - on LIPITOR  '10mg'$ /d...  ~  [place] 5/07 showed TChol 115, Tg 58, HDL 36, LDL 67 ~  FLP 5/08 showed TChol 118, TG 66, HDL 31, LDL 74 ~  FLP 5/09 showed TChol 126, TG 71, HDL 37, LDL 75 ~  FLP 6/10 showed TChol 116, TG 73, HDL 40, LDL 62 ~  2/11:  FLP not checked during the hospitalization... ~  FLP 4/11 in hosp showed TChol 94, TG 52, HDL 23, LDL 61 ~  FLP 12/12 on Lip10 showed TChol 107, TG 38, HDL 40, LDL 60 ~  FLP 3/16 on Lip10 showed TChol 133, TG 82, HDL 43, LDL 74  HYPOTHYROIDISM >> he remains clinically euthyroid... ~  Labs 12/12 showed TSH= 6.10 ~  Labs 6/13 showed TSH= 6.66 ~  Labs 5/14 showed TSH= 6.21... We decided to start SYNTHROID 59mg/d... ~  Labs 5/15 on Levothy50 showed TSH= 1.92 ~  Labs 3/16 on Synthroid50 showed TSH= 2.82  DIVERTICULOSIS OF COLON (ICD-562.10) & COLONIC POLYPS (ICD-211.3) - hx polyps in 2003 = tubular adenoma... ~  last colonoscopy 9/06 showed divertics, hems, no polyps. ~  Pt followed by Ryan Hess & they decided to forgo any further GI procedures/ colonoscopies...  ISCHEMIC BOWEL >> GFarzadpresented to the ER 7/23 - 04/25/12 w/ abd pain- ELap surg revealed ~5 feet of frankly ischemic small bowel, likely due to internal hernia and closed loop obstruction=> Bowel resected & pulses intact w/ no evidence of embolic or thrombotic pathology;  Viable small bowel anastomosed & wound vac applied; he had a stable post op course...  RENAL INSUFFICIENCY >>  PROSTATE CANCER (ICD-185) - eval by DrWrenn and they decided on XRT by DrKinard- finished 5/09 and he states that DrKinard "released me"... he sees DrWrenn every 6 months & they follow PSA closely (notes reviewed)... ~  8/12: f/u prostate cancer dx in 2009 & treated w/ XRT completed 6/09; slowly rising PSA w/ doubling time 6-98momin voiding symptoms, he is considering androgen ablation if needed... ~  6/13: He developed hematuria & urinary retention requiring Urology eval, discontinuation of Coumadin, & Foley placement=> subseq  removed 8/13 w/ adeq voiding trial... ~  3/14:  He had f/u visit w/ DrWrenn- PSA nadir after XRT was <0.04 and it has climbed to 0.54 (3/14) w/ PSADT of ~1y36yrhey chose to continue watchful waiting... ~  1/15:  DrWrenn reports that PSA is up to 1.46 ~  Labs 5/15 on Lasix40 showed Cr=1.6, BNP=184 ~  7/15: he had f/u w/ DrWrenn> BPH w/ BOO, Prostate ca; on Flomax0.4, PSA up to 1.84, neg bone scan x degen changes; they continue to follow w/ watchful waiting, ROV in 29mo69mo~  He maintains regular f/u w/ drWrenn> we do not have recent clinic notes but pt indicates that everthing is ok... ~  3/16: [facility] & [place] in [place] thickening & trabeculation of the bladder wall & prox  ascending bowel wall thickening... NOTE> they rec f/u w/ Urology & records sent in light of these findings... ~  Labs 3/16 showed BUN=32, Cr= 1.85...  DEGENERATIVE JOINT DISEASE (ICD-715.90) - s/p bilat TKR's, [facility]- DrOlin... He has OXY-IR '5mg'$  as needed for pain...  MEMORY LOSS, SENILE DEMENTIA, Vasc disease/ infarcts on MRI/ global atrophy >> he tried [name] in the past & they stopped it... 05/2016> he's been sundowning & we prescribed Seroquel '25mg'$ - 1/2 to 1 tab as needed...   ACTINIC SKIN DAMAGE (ICD-692.70) - followed by Brunetta Jeans, he knows to avoid sun exposure, use sun screen, etc...  ANEMIA >> ~  Labs 6/13 showed Hg= 8.4, Fe= 31 (11%sat); Rec to start FeSO4 Bid... ~  Labs 7/13 in hosp showed Hg= 12.6=>8.2 at disch... ~  Labs 8/13 showed Hg= 9.5, MCV= 94; rec to continue FeSO4 Bid... ~  Labs 10/13 showed Hg= 13.2 & ok to wean Fe to 1/d til gone then stop... ~  Labs 5/14 showed Hg= 14.2 ~  Labs 5/15 showed Hg= 13.2 ~  Labs 3/16 showed Hg= 14.4   Past Surgical History:  Procedure Laterality Date  . ABDOMINAL AORTIC ANEURYSM REPAIR  1998  . APPENDECTOMY    . APPLICATION OF WOUND VAC  04/18/2012   Procedure: APPLICATION OF WOUND VAC;  Surgeon: Joyice Faster. Cornett, MD;   Location: [place];  Service: General;  Laterality: N/A;  Removal of abdominal wound vac, Application of incisional  wound vac  . INGUINAL HERNIA REPAIR     left  . JOINT REPLACEMENT    . LAPAROTOMY  04/16/2012   Procedure: EXPLORATORY LAPAROTOMY;  Surgeon: Madilyn Hook, DO;  Location: [place];  Service: General;  Laterality: N/A;  exploratory Laparotomy,Small bowel ressection abdominal wound vac placement.  [name] LAPAROTOMY  04/18/2012   Procedure: EXPLORATORY LAPAROTOMY;  Surgeon: Joyice Faster. Cornett, MD;  Location: [place];  Service: General;  Laterality: N/A;  exploratory laparotomy with small bowel anastomosis  . repair of AA  04/14/2013   done at [place]  . TOTAL KNEE ARTHROPLASTY     bilateral    Outpatient Encounter Prescriptions as of 07/19/2016  Medication Sig Dispense Refill  . atorvastatin (LIPITOR) 10 MG tablet Take 10 mg by mouth daily.    . Fluticasone-Salmeterol (ADVAIR) 250-50 MCG/DOSE AEPB Inhale 1 puff into the lungs 2 (two) times daily.    . furosemide (LASIX) 40 MG tablet Take 40 mg by mouth daily.    [name] guaiFENesin (MUCINEX) 600 MG 12 hr tablet Take 600 mg by mouth 2 (two) times daily. COPD.    [name] levothyroxine (SYNTHROID, LEVOTHROID) 50 MCG tablet TAKE 1 TABLET BY MOUTH DAILY 90 tablet 0  . metoprolol succinate (TOPROL XL) 25 MG 24 hr tablet Take 1 tablet (25 mg total) by mouth daily. 90 tablet 3  . Multiple Vitamin (MULTIVITAMIN) tablet Take 1 tablet by mouth daily.      [name] neomycin-polymyxin b-dexamethasone (MAXITROL) 3.5-10000-0.1 OINT Place 1 application into both eyes as needed (for eye irriation or itching).    . potassium chloride (K-DUR) 10 MEQ tablet Take 2 tablets by mouth on day 1 then 1 tablet by mouth daily thereafter 91 tablet 1  . QUEtiapine (SEROQUEL) 25 MG tablet Take 1 tablet (25 mg total) by mouth at bedtime. 30 tablet 5  . sodium bicarbonate 650 MG tablet Take 2 tablets by mouth two times daily 120 tablet 5  . tiotropium (SPIRIVA) 18 MCG inhalation capsule Place  18 mcg into inhaler and inhale  daily.    . traMADol (ULTRAM) 50 MG tablet Take 1 tablet (50 mg total) by mouth 3 (three) times daily as needed. for pain 90 tablet 0  . warfarin (COUMADIN) 1 MG tablet Take 1 tablet (1 mg total) by mouth as directed. 30 tablet 2  . [DISCONTINUED] furosemide (LASIX) 40 MG tablet Take 2 tablets by mouth every morning (Patient not taking: Reported on 07/19/2016) 30 tablet 1   No facility-administered encounter medications on file as of 07/19/2016.     No Known Allergies   Current Medications, Allergies, Past Medical History, Past Surgical History, Family History, and Social History were reviewed in [employer] record.   Review of Systems        See HPI - all other systems neg except as noted...  The patient complains of dyspnea on exertion.  The patient denies anorexia, fever, weight loss, weight gain, vision loss, decreased hearing, hoarseness, chest pain, syncope, peripheral edema, prolonged cough, headaches, hemoptysis, abdominal pain, melena, hematochezia, severe indigestion/heartburn, hematuria, incontinence, muscle weakness, suspicious skin lesions, transient blindness, difficulty walking, depression, unusual weight change, abnormal bleeding, enlarged lymph nodes, and angioedema.     Objective:   Physical Exam    WD, WN, 82 y/o WM in NAD... GENERAL:  Alert & oriented; pleasant & cooperative; pale complexion... HEENT:  [place]/AT, EOM-wnl, PERRLA, EACs-clear, TMs-wnl, NOSE-clear, THROAT-clear & wnl. NECK:  Supple w/ fairROM; no JVD; normal carotid impulses w/o bruits; no thyromegaly or nodules palpated; no lymphadenopathy. CHEST:  decr BS bilat, scat bibasilar rales, w/o wheezing/ rhonchi/ signs of consolidation. HEART:  irregular rhythm, gr1/6 diast murmur in Ao area, without rubs or gallops detected... ABDOMEN:  Soft & nontender; normal bowel sounds; no organomegaly or masses palpated... EXT: s/p bilat TKR's, mod arthritic changes;  +venous insuffic, left leg swelling/ red > right leg, no drainage NEURO:  CN's intact;  gait abn; no focal neuro deficits... DERM:  No lesions noted; no rash etc...  RADIOLOGY DATA:  Reviewed in the EPIC EMR & discussed w/ the patient...  LABORATORY DATA:  Reviewed in the EPIC EMR & discussed w/ the patient...   Assessment:      COPD/Emphysema, Hx Pneumonia>  baseline severe COPD/Emphysema w/ Cardiomeg, Aneurysm & bibasilar fibrosis, stable on Advair250, Spiriva, Mucinex...  HBP>  HBP, CHF, AFib> Coumadin on HOLD after GIB, off ASA81, off Losar25, on MetopER25, & Lasix40 on HOLD; BP= 124/88 range & they note that Ryan Hess doesn't want it too low due to his atherosclerosis & the endograft; he last saw [facility] 12/2015... 07/19/16>  Continue Lasix40, K10, Bicarb tabs-- Hospice is on board as he is a DNR & refused dialysis; recheck pt in 2 wks  CAD/ CHF/ etc>  Followed by Cherly Hensen & his prev notes are reviewed;  BNP 12/14 was up to 4200 & on Lasix40 it has improved to 184 by 5/15; 3/17 it measures 462 & reminded low sodium...Marland KitchenMarland KitchenMarland Kitchen  AFib>  On above + Coumadin followed in the [facility] but Protime became supratherapeutic 05/2016 causing GIB, AKI, etc; Coumadin on hold now... 07/05/16>  We discussed restart Coumadin w/ reduced INR goal ~2.0 07/20/16>  Followed by [facility] & INR=2.6, they are adjusting & following...  Periph Vasc Dis>  S/p AAA repair 1998 by Sheryn Bison; then followed by DrGearhardt for thoraco-abd aneurysm & referred to [facility] for stent graft surg- done 4/13 by Ryan Hess w/ complications, and 2nd stage of the surgery 7/14 w/ 5 stents placed per Ryan Hess... They continue to follow regularly w/ CT scans every  16mo..  Ven Insuffic & Edema>  Prev improved w/ Lasix40/d;  He knows to elim salt, elev legs, wear support hose, etc;  He fell 7/15 w/ bruise reported, then followed by redness in skin & incr left leg swelling=> VenDoppler is neg for DVT & Protime was too thin=> adjusted; swelling resolved over time &  back to baseline...  CHOL>  FLP looks good on Lip10...  Subclinical Hypothyroid>  TSH was 6.21 and Synthroid50 started 5/14=> TSH improved to 2.82...  GI> Presbyesoph, Divertics, Hx polpyps>  Stable & followed by Ryan Hess... 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 05/2016>  ADM w/ GIB due to INR=6.7; seen by DrSchooler, no intervention; treated w/ PPI but not disch on one....Marland KitchenMarland KitchenMarland Kitchen Prostate Cancer>  Followed by DrWrenn  w/ slowly rising PSA as noted; then developed urinary retention after lumbar surg and hematuria after foley & coumadin; now back to baseline & being followed... 9/16> [facility] pt to take to f/u appt w/ DrWrenn... 9/17> he is followed by DrWrenn- hx prostate Ca w/ prev XRT 2009 & PSA recurrence (PSA=2.8 [id number] nadir <0.04); Hosp w/ GIB, had UTI w/ Klebs, urinary retention, & foley placed, outpt management by Urology...  DJD>  Followed by DrOlin, s/p bilat TKRs...  ANEMIA>  He developed acute blood loss anemia w/ post op Hg= 11-12 but dropped to 8.6 w/ hematuria & INR=4.2; Coumadin & ASA placed on HOLD; f/u labs showed Hg=8.4 & Fe=31 & started on FeSO4 Bid;  Then Hg=9.5 & rec to continue Fe Bid;  Cards restarted his Coumadin;  Now Hg= 14.4  05/2016> he was HOSP by [facility] w/ GIB/melena, INR=6.7, Hg=8.1, low Fe- given 2u Tx & rec for OTC Fe 1/d...  Actinic skin changes>  Aware, he is monitored by Derm...     Plan:     Patient's Medications  New Prescriptions   No medications on file  Previous Medications   ATORVASTATIN (LIPITOR) 10 MG TABLET    Take 10 mg by mouth daily.   FLUTICASONE-SALMETEROL (ADVAIR) 250-50 MCG/DOSE AEPB    Inhale 1 puff into the lungs 2 (two) times daily.   FUROSEMIDE (LASIX) 40 MG TABLET    Take 40 mg by mouth daily.   GUAIFENESIN (MUCINEX) 600 MG 12 HR TABLET    Take 600 mg by mouth 2 (two)  times daily. COPD.   LEVOTHYROXINE (SYNTHROID, LEVOTHROID) 50 MCG TABLET    TAKE 1 TABLET BY MOUTH DAILY   METOPROLOL SUCCINATE (TOPROL XL) 25 MG 24 HR TABLET    Take 1 tablet (25 mg total) by mouth daily.   MULTIPLE VITAMIN (MULTIVITAMIN) TABLET    Take 1 tablet by mouth daily.     NEOMYCIN-POLYMYXIN B-DEXAMETHASONE (MAXITROL) 3.5-10000-0.1 OINT    Place 1 application into both eyes as needed (for eye irriation or itching).   POTASSIUM CHLORIDE (K-DUR) 10 MEQ TABLET    Take 2 tablets by mouth on day 1 then 1 tablet by mouth daily thereafter   QUETIAPINE (SEROQUEL) 25 MG TABLET    Take 1 tablet (25 mg total) by mouth at bedtime.   SODIUM BICARBONATE 650 MG TABLET    Take 2 tablets by mouth two times daily   TIOTROPIUM (SPIRIVA) 18 MCG INHALATION CAPSULE    Place 18 mcg into inhaler and inhale daily.   TRAMADOL (ULTRAM) 50  MG TABLET    Take 1 tablet (50 mg total) by mouth 3 (three) times daily as needed. for pain   WARFARIN (COUMADIN) 1 MG TABLET    Take 1 tablet (1 mg total) by mouth as directed.  Modified Medications   No medications on file  Discontinued Medications   FUROSEMIDE (LASIX) 40 MG TABLET    Take 2 tablets by mouth every morning

## 2016-07-20 ENCOUNTER — Ambulatory Visit (INDEPENDENT_AMBULATORY_CARE_PROVIDER_SITE_OTHER): Payer: Medicare Other | Admitting: Cardiovascular Disease

## 2016-07-20 DIAGNOSIS — I4819 Other persistent atrial fibrillation: Secondary | ICD-10-CM

## 2016-07-20 DIAGNOSIS — I481 Persistent atrial fibrillation: Secondary | ICD-10-CM

## 2016-07-20 LAB — POCT INR: INR: 2.6

## 2016-07-24 ENCOUNTER — Telehealth: Payer: Self-pay | Admitting: Pulmonary Disease

## 2016-07-24 DIAGNOSIS — I5022 Chronic systolic (congestive) heart failure: Secondary | ICD-10-CM

## 2016-07-24 DIAGNOSIS — J4489 Other specified chronic obstructive pulmonary disease: Secondary | ICD-10-CM

## 2016-07-24 DIAGNOSIS — J449 Chronic obstructive pulmonary disease, unspecified: Secondary | ICD-10-CM

## 2016-07-24 DIAGNOSIS — N179 Acute kidney failure, unspecified: Secondary | ICD-10-CM

## 2016-07-24 NOTE — Telephone Encounter (Signed)
Called [facility] back and spoke with Marcie Bal.  She stated that she did not know who Alice Ward is.  She will reach out to the Rn that is taking care of the pt and see if this was from her.  She will have the nurse call us back .

## 2016-07-25 ENCOUNTER — Telehealth: Payer: Self-pay | Admitting: Pulmonary Disease

## 2016-07-25 NOTE — Telephone Encounter (Signed)
Called and spoke with pts daughter Donna---she stated that the pt was up out of bed, has been eating all evening and drinking lots of fluids.  Butch Penny is aware that per SN---ok to stop the lipitor at this time but continue the other meds.  We will check labs in the am for protime and bmp.    I called pam from hospice and lmomtcb --stated in the message about the labs that need to be done--and that ok to stop the lipitor.  Waiting for call back.

## 2016-07-25 NOTE — Telephone Encounter (Signed)
Spoke with pam with hospice who states over the weekend patient became very weak. Pam states pt last ate o meal Sunday which was one meal. Hospice doc suggest with what's going on patient should stop lasix and lipitor. Hospice doc assumes his INR can be going up. Pt heart rate this morning was 98 and irregular. Pam states pt looks a little dehydrated. Pam also ask if SN thinks pt should stop potassium chloride as well.  SN please advise. Thanks.  Current Outpatient Prescriptions on File Prior to Visit  Medication Sig Dispense Refill  . atorvastatin (LIPITOR) 10 MG tablet Take 10 mg by mouth daily.    . Fluticasone-Salmeterol (ADVAIR) 250-50 MCG/DOSE AEPB Inhale 1 puff into the lungs 2 (two) times daily.    . furosemide (LASIX) 40 MG tablet Take 40 mg by mouth daily.    [name] guaiFENesin (MUCINEX) 600 MG 12 hr tablet Take 600 mg by mouth 2 (two) times daily. COPD.    [name] levothyroxine (SYNTHROID, LEVOTHROID) 50 MCG tablet TAKE 1 TABLET BY MOUTH DAILY 90 tablet 0  . metoprolol succinate (TOPROL XL) 25 MG 24 hr tablet Take 1 tablet (25 mg total) by mouth daily. 90 tablet 3  . Multiple Vitamin (MULTIVITAMIN) tablet Take 1 tablet by mouth daily.      [name] neomycin-polymyxin b-dexamethasone (MAXITROL) 3.5-10000-0.1 OINT Place 1 application into both eyes as needed (for eye irriation or itching).    . potassium chloride (K-DUR) 10 MEQ tablet Take 2 tablets by mouth on day 1 then 1 tablet by mouth daily thereafter 91 tablet 1  . QUEtiapine (SEROQUEL) 25 MG tablet Take 1 tablet (25 mg total) by mouth at bedtime. 30 tablet 5  . sodium bicarbonate 650 MG tablet Take 2 tablets by mouth two times daily 120 tablet 5  . tiotropium (SPIRIVA) 18 MCG inhalation capsule Place 18 mcg into inhaler and inhale daily.    . traMADol (ULTRAM) 50 MG tablet Take 1 tablet (50 mg total) by mouth 3 (three) times daily as needed. for pain 90 tablet 0  . warfarin (COUMADIN) 1 MG tablet Take 1 tablet (1 mg total) by mouth as directed. 30  tablet 2  . [DISCONTINUED] pantoprazole (PROTONIX) 40 MG tablet Take 1 tablet (40 mg total) by mouth daily. 30 tablet 5   No current facility-administered medications on file prior to visit.

## 2016-07-25 NOTE — Telephone Encounter (Signed)
Spoke with Danton Clap Ward at [facility], states that pt's son is having difficulty with pt's home management and is requesting hospice referral- states pt is in increased pain, difficulty ambulating, increased confusion.  Alice states that hospice will be reaching out to [place] also. Spoke with Leigh regarding this patient, will place order for hospice.  Nothing further needed at this time.

## 2016-07-25 NOTE — Telephone Encounter (Signed)
Called and spoke with Hospice and she is aware that SN will be the attending. Nothing further is needed.

## 2016-07-26 ENCOUNTER — Encounter: Payer: Self-pay | Admitting: Pulmonary Disease

## 2016-07-26 NOTE — Telephone Encounter (Signed)
Will send the INR results to SN.  Will send the BMET results once they are reported.

## 2016-07-26 NOTE — Telephone Encounter (Signed)
Hospice INR is 1.6 and Bmp was Drawn she is taking it to the lab now [place]

## 2016-07-27 ENCOUNTER — Ambulatory Visit (INDEPENDENT_AMBULATORY_CARE_PROVIDER_SITE_OTHER): Payer: Medicare Other | Admitting: Cardiology

## 2016-07-27 DIAGNOSIS — I4819 Other persistent atrial fibrillation: Secondary | ICD-10-CM

## 2016-07-27 DIAGNOSIS — I481 Persistent atrial fibrillation: Secondary | ICD-10-CM

## 2016-07-27 LAB — POCT INR: INR: 1.6

## 2016-07-27 NOTE — Telephone Encounter (Signed)
Ryan Hess from [place] called stating that she hasn't heard back from [place] regarding results she reported yesterday. Advised we are waiting for BMet results to be faxed to [place]. She would like a call back when we have everything - [phone]

## 2016-07-27 NOTE — Telephone Encounter (Signed)
Noted by triage Will continue to await BMET results

## 2016-07-27 NOTE — Telephone Encounter (Signed)
I called pam from hospice and she stated that she does the Intake and told me to call [phone] and ask for the results of the labs  I called and they are going to fax these to our office.

## 2016-07-27 NOTE — Telephone Encounter (Signed)
SN has reviewed the labs--  We will keep the potassium at 10 meq every day Progressive kidney failure so continue the lasix 40 mg  Daily INR 1.6 and it was 2.6 with the last draw from [facility].  Keep the same dosing and follow up with [facility].   I spoke with Coralyn Mark and [place] from [place] will call the [facility] to follow up with them on the Protime done.  Coralyn Mark is aware that we will cont these meds. Nothing further is needed.

## 2016-08-02 ENCOUNTER — Ambulatory Visit (INDEPENDENT_AMBULATORY_CARE_PROVIDER_SITE_OTHER): Payer: Medicare Other | Admitting: Cardiology

## 2016-08-02 DIAGNOSIS — I481 Persistent atrial fibrillation: Secondary | ICD-10-CM

## 2016-08-02 DIAGNOSIS — I4819 Other persistent atrial fibrillation: Secondary | ICD-10-CM

## 2016-08-02 LAB — POCT INR: INR: 2

## 2016-08-09 ENCOUNTER — Other Ambulatory Visit (INDEPENDENT_AMBULATORY_CARE_PROVIDER_SITE_OTHER): Payer: Medicare Other

## 2016-08-09 ENCOUNTER — Ambulatory Visit (INDEPENDENT_AMBULATORY_CARE_PROVIDER_SITE_OTHER): Payer: Medicare Other | Admitting: Pulmonary Disease

## 2016-08-09 ENCOUNTER — Encounter: Payer: Self-pay | Admitting: Pulmonary Disease

## 2016-08-09 VITALS — BP 112/62 | HR 60 | Temp 96.5°F | Ht 69.0 in | Wt 151.8 lb

## 2016-08-09 DIAGNOSIS — J4489 Other specified chronic obstructive pulmonary disease: Secondary | ICD-10-CM

## 2016-08-09 DIAGNOSIS — M159 Polyosteoarthritis, unspecified: Secondary | ICD-10-CM

## 2016-08-09 DIAGNOSIS — J449 Chronic obstructive pulmonary disease, unspecified: Secondary | ICD-10-CM

## 2016-08-09 DIAGNOSIS — I5022 Chronic systolic (congestive) heart failure: Secondary | ICD-10-CM | POA: Diagnosis not present

## 2016-08-09 DIAGNOSIS — M150 Primary generalized (osteo)arthritis: Secondary | ICD-10-CM

## 2016-08-09 DIAGNOSIS — I481 Persistent atrial fibrillation: Secondary | ICD-10-CM

## 2016-08-09 DIAGNOSIS — I10 Essential (primary) hypertension: Secondary | ICD-10-CM

## 2016-08-09 DIAGNOSIS — I739 Peripheral vascular disease, unspecified: Secondary | ICD-10-CM

## 2016-08-09 DIAGNOSIS — Z23 Encounter for immunization: Secondary | ICD-10-CM

## 2016-08-09 DIAGNOSIS — E039 Hypothyroidism, unspecified: Secondary | ICD-10-CM

## 2016-08-09 DIAGNOSIS — I4819 Other persistent atrial fibrillation: Secondary | ICD-10-CM

## 2016-08-09 DIAGNOSIS — N289 Disorder of kidney and ureter, unspecified: Secondary | ICD-10-CM

## 2016-08-09 LAB — BASIC METABOLIC PANEL
BUN: 51 mg/dL — ABNORMAL HIGH (ref 6–23)
CO2: 24 mEq/L (ref 19–32)
Calcium: 8.7 mg/dL (ref 8.4–10.5)
Chloride: 105 mEq/L (ref 96–112)
Creatinine, Ser: 3.64 mg/dL — ABNORMAL HIGH (ref 0.40–1.50)
GFR: 16.77 mL/min — ABNORMAL LOW (ref >60.00)
Glucose, Bld: 96 mg/dL (ref 70–99)
Potassium: 4 mEq/L (ref 3.5–5.1)
Sodium: 140 mEq/L (ref 135–145)

## 2016-08-09 NOTE — Patient Instructions (Signed)
Today we updated your med list in our EPIC system...    Continue your current medications the same...  Today we rechecked your metabolic panel...    We will contact you w/ the results when available...   Continue your current meds the same for now...  Call for any questions...  Let's plan a follow up visit in 4-6wks, sooner if needed for problems.Marland KitchenMarland Kitchen

## 2016-08-09 NOTE — Progress Notes (Signed)
Subjective:     Patient ID: Ryan Hess, male   DOB: Apr 16, 1924, 82 y.o.   MRN: [id number]  HPI 82 y/o WM here for a follow up visit... he has multiple medical problems as noted below...   ~  SEE PREV EPIC NOTES FOR OLDER DATA >>     5/14:  Ryan Hess is awaiting a call from Ryan Hess at [facility] about the next stage in his planned thoracoabd aneurysm repair- he had his situation reviewed by Ryan Hess here & he rec f/u at [facility] per the original plan since this needs to be done endovascularly & is quite complicated...   LABS 5/14:  Chems- ok w/ stable mild RI (Cr=1.5);  CBC- wnl w/ Hg=14.2;  TSH=6.21 (Synthroid50 started);  BNP=263;  Protimes adjusted by the CC...   Ryan Hess has had his f/u surg at [facility] by [employer] w/ 5 stents placed 04/15/13 in a >4h operation w/ special spinal precautions; he developed a blood clot in his left arm w/ some persist swelling...  CXR 10/14 showed chr changes of Ao stent graft, stable cardiomegaly, no infiltrates, NAD...   LABS 10/14:  CBC- wnl;  Protimes followed in CC...    LABS 5/15:  Chems- ok x BUN=26 Cr=1.6;  CBC- wnl w/ Hg=13.2;  TSH=1.92 on Levothy50;  BNP=184...   LABS 7/15:  Chems- ok x BUN=33 Cr=2.0;  CBC- ok w/ Hg=12.2 WBC=6.9;  Protime=49sec, INR=4.8;  Sed=37  D-Dimer=2.50  CRP=4.4  VenDopplers 7/15 showed NEG for DVT...   ~  December 14, 2014:  67moROV & [name] a follow up [facility] and [facility] [place] [facility]> see details below but everything stable and no med changes required:  CXR 1/16 revealed stable thoracoabdominal aortic endograft- no change, stable heart size, COPD/Emphysema & mild right basilar atelectasis, NAD...  Abd XRay 1/16 showed 4-vessel fenestrated thoracoabdominal aortic endograft w/ branch stents in celiac/ SMA/ & bilat renal arts- all unchanged, extensive DDD in spine, L4 compression- all unchanged...   Renal/ Mesenteric Ultrasound 1/16> no evid of hemodynamically signif RAS  noted bilat, right renal cyst, distal SMA stent/ mid & distal SMA were normal, celiac & prox SMA stent not visualized...   CT Chest/ Abd/ Pelvis 1/16 w/o contrast> stable to sl decr in size of the mid thoracic Ao aneurysm, stable appearance of the 4-vessel fenestrated thoracoabdominal endograft; thickening & trabeculation of the bladder wall & prox ascending bowel wall thickening... NOTE> they rec f/u w/ Urology & GI in light of these findings... Ryan Hess was very [facility] he states he is feeling well, now 82y/o, and denies CP, palpit, dizzy, change is dyspnea, & no edema; he is able to walk to the mailbox slowly & does ok- this is stable; he notes min cough/ beige sput/ no hemoptysis or f/c/s; he notes that he is a little more forgetful & son confirms some progressive decline in memory- they are ready to try [name] Rx (start w/ 521md 7 incr to 1058m)...     COPD, HxPneumonia> on Advair250Bid & Spiriva daily, plus Mucinex600Bid; denies resp exac- continue same, avoid infections...    Cards- HBP, CHF, AFib> on Coumadin via CC, ASA81, Losar100, Lasix40; BP= 122/60 & they note that Ryan Hess doesn't want it too low due to his atherosclerosis & the endograft; he is overdue for Cards follow up visit...     Peripheral Vasc Dis> see above...    Hyperlipidemia> on Atorva10; FLP today  showed TChol 133, TG 82, HDL 43, LDL 74... continue same Rx...    Hypothyroid> on Synthroid50; clinically euthyroid & Labs 3/16 showed TSH= 2.82...    GI- HxDivertics, Polyps, ischemic bowel> see above; CT Abd&Pelvis w/o contrast 1/16 at [facility] w/ focal thickening in bowel wall in Prox asc colon; he saw Ryan Hess last 2012 & they decided to forgo any f/y colonoscopies for the obvious reasons; we will follow clinically- watch blood counts and stools, Ryan Hess plans f/u CT scans in another 93mo..     GU- prostate cancer and RI> he is followed by DrWrenn for Urology & seen several weeks ago by his hx (we do  not have notes from them); pt reports everything was good & I will [facility] & [facility] to Urologist's attn...    DJD> [name] to be stable, s/p bilat TKRs, on Tramadol & tylenol prn...    Anemia> Labs today showed Hg= 14.4, MCV= 94, all back to wnl... We reviewed prob list, meds, xrays and labs> see below for updates >>   LABS 3/16:  FLP- at goals on Atorva10;  Chems- ok w/ Cr improved to 1.85;  CBC- wnl;  TSH=2.82 on Synthroid50... PLAN>>  We decided to start [name] 5=>171md for his vascular dementia & memory loss;  He will f/u w/ Cards at his earliest convenience; we will [facility] & scan report to DrWrenn, Urology to review...   ~  June 16, 2015:  28m29moV & [name] that he is doing well- feeling good, min cough, no sput, no blood, stable SOB/DOE w/o change; he ambulates w/ a cane & needs more exercise, we discussed this- not having any CP, palpit, edema...  [facility]/ studies below-- no changes made & they are continuing 28mo 35mo; they did rec f/u by Urology due to CT scan findings and he has an upcoming appt w/ DrWrenn...   CXR 8/16> similar appearance to the thoracoabd endograft, lungs appear unchanged...   Abd film 8/16> similar appearance of the endograft w/o kink/ stenosis/ or other complic evident; degen changes in the spine...  Renal/ Mesenteric Ultrasound 8/16> no evid of renal art stenosis bilat w/ patent stents; norm hepatic & mesenteric art findings w/ patent SMA stent  CTA Chest/ Abd/ Pelvis 8/16> no change in appearance of the Aortic fenestrated endograft spaning the desc aorta to the infrarenal AA w/ stent limbs to the celiac art, SMA, bilat renal arts; 2 areas of aneurysmal dilatation- mid-thor sac measures 5.8cm (prev 5.5cm), & upper abd aneurysm sac measures 7.2cm (prev 7.5cm); apical scarring & severe centrilob & paraseptal emphysema, 1cm RUL nodule unchanged, no adenopathy; right renal cyst & irreg enhancing  post bladder wall thickening which is new; radiotherapy seeds in prostate; esoph is patulous w/ A/F level distally; DJD & L4 compression w/o change... COPY TO PT TO SHOW TO Dr. WrennJeffie PollockLABS at [facility]:  BUN=31, Cr=1.87 EXAM shows Afeb, VSS, O2sat=97% on RA;  HEENT- neg, mallampati1;  Chest- sl decr BS at bases w/ scat rhonchi, no w/r/consolidation;  Heart- irreg AFib, Gr1/6SEM w/o r/g;  Abd- soft, nontender;  Ext- w/o c/c/e;  Neuro- intact...    COPD, HxPneumonia> on Advair250Bid & Spiriva daily, plus Mucinex600Bid; denies resp exac- continue same, avoid infections...    Cards- HBP, CHF, AFib> on Coumadin via CC, ASA81, Losar50, Lasix40; BP= 116/50 & they note that Ryan Hess doesn't want it too low due to his atherosclerosis & the endograft;  he is overdue for Cards follow up visit...     Peripheral Vasc Dis> see above per Ryan [facility] at [facility] [place] [facility]...    Hyperlipidemia> on Atorva10; FLP 3/16 showed TChol 133, TG 82, HDL 43, LDL 74... continue same Rx...    Hypothyroid> on Synthroid50; clinically euthyroid & Labs 3/16 showed TSH= 2.82...    GI- HxDivertics, Polyps, ischemic bowel> see above; CTA Abd&Pelvis 8/16 at [facility] looked ok; he saw Ryan Hess last 2012 & they decided to forgo any f/u colonoscopies; we will follow clinically- watch blood counts and stools...    GU- prostate cancer and RI> he is followed by DrWrenn for Urology & he has up coming appt to address the abn finding on CTA Abd/Pelvis w/ post bladder wall thickening...    DJD> Herron appears to be stable, s/p bilat TKRs, on Tramadol & Tylenol prn...    Anemia> Labs 3/16 showed Hg= 14.4, MCV= 94, all back to wnl...    Memory Loss> we had prev started [name]5=>10 but pt has stopped this med & they do not want to pursue this eval or Rx... IMP/PLAN>>  We sent a copy of his CT Chest/ Abd/ Pelvis from [facility] Everywhere report) with the pt to give to DrWrenn; he will continue current meds and incr exercise; overdue for Cards f/u & they will call; Given  2016 FLU shot today...  ~  December 14, 2015:  62moROV & Najib reports that he is doing OK overall- he has developed a small skin ulser over his sacrum 7 we discussed avoiding pressure on this area (use donut), wash/ gauze/ vaseline or desitin/ etc, they will call if not resolving... He reports breathing is good- sl cough, sm amt yellow sput, no f/c/s, no CP & stable SOB/DOE (no change...  EXAM shows Afeb, VSS, O2sat=97% on RA;  HEENT- neg, mallampati1;  Chest- sl decr BS at bases w/ scat rhonchi, no w/r/consolidation;  Heart- irreg AFib, Gr1/6SEM w/o r/g;  Abd- soft, nontender;  Ext- w/o c/c/e;  Neuro- intact...    COPD, HxPneumonia> on Advair250Bid & Spiriva daily, plus Mucinex600Bid; denies resp exac- continue same, avoid infections...    Cards- HBP, CHF, AFib> on Coumadin via CC, ASA81, Aten25, Losar25, Lasix40; BP= 112/70 & they note that Ryan Hess doesn't want it too low due to his atherosclerosis & the endograft; he saw DrNishan 11/15/15-- HBP, AAA repair, [facility]ib on cou[facility]- he restarted Aten25 for nodal blockade..    Peripheral Vasc Dis> [facility] seen in Care Everywhere 04/2015 w/ CTA chest/abd/pelvis- note reviewed)...    Hyperlipidemia> on Atorva10; FLP 3/17 showed TChol 120, TG 98, HDL 36, LDL 65... continue same Rx...    Hypothyroid> on Synthroid50; clinically euthyroid but Labs 3/17 showed TSH= 6.98 so we rec incr Synthroid 7173m/d...    GI- HxDivertics, Polyps, ischemic bowel> see above; [facility] he saw Ryan [facility] last 2012 & they decided to forgo any f/u colonoscopies; we will follow clinically- watch blood counts and stools...    GU- prostate cancer, RI, chr cystitis> he is followed by DrWrenn for Urology, abn finding on CTA Abd/Pelvis w/ post bladder wall thickening, last seen 10/22/15- cysto 06/2015 w/ inflamm in bladder but no mass, cyto neg, he's had hematuria & prev on 73m71moppressive Keflex for chr cystitis,  they are following closely...    DJD> GeoAbhinavpears to be stable, s/p bilat TKRs, LBP, etc; on Tramadol & Tylenol prn...Marland KitchenMarland Kitchen  Anemia> Labs 3/17 showed Hg= 14.6, MCV= 94, all back to wnl...    Memory Loss> we had prev started [name]5=>10 but pt has stopped this med & they do not want to pursue this eval or Rx...  EKG 11/15/15>  Chr AFib, rate114, LAD, PVC, NSSTTWA...  CXR 12/14/15>  Norm heart size, extensive Ao stent graft in place, coarse interstitial markings bilat- NAD= no infiltrate/ masses/ fluid/ etc...  LABS 12/14/15>  FLP-  At goals on Lip10;  Chems- ok x BUN=32, Cr=2.00, AlkPhos=235 & GGT=119 (7-51);  BNP=462 on Lasix40;  CBC- wnl w/ Hg=14.6, wbc=11.6;  TSH=6.98 IMP/PLAN>>  Davide is 83 y/o 7 remarkably stable for all he's been through- rec to continue current meds, continue regular follow up visits, avoid infections, stay active, & he will call for any problems in the interval...  ~  June 29, 2016:  71moROV & post hosp check>  [facility] due to melena, weakness, hypotension, AKI w/ BUN=141, Cr=5.39 (from his baseline of 32/2.0) , UTI w/ sens Klebs, and anemic w/ Hg=8.1 (from baseline~14), mcv=92, Fe=11 (7%sat), stool heme pos & his INR was 6.7 (Coumadin held by CC several days prior to adm);  Rx w/ VitK, 2uPCs, PPI, and Coumadin held;  GI consulted- they decided against endoscopic studies or further testing;  He had an unresponsive episode during the hosp (occured after a bowel movement & felt to be vagally mediated)- MRI Brain was NEG for acute abn;  Urology was consulted for mild hydronephrosis, hx prostate Ca w/ prev XRT 2009 & PSA recurrence (PSA=2.8 [id number] nadir <0.04)- foley placed & will be managed as outpt;  Nephrology also consulted- prev Losartan & Lasix were held; Klebs UTI treated w/ Cephalosporin x10d;  AKI (hypotension, GIB, meds, mild hydro) and CKD4- not a dialysis cand, he was treated w/ IVF, bicarb, may need Lasix restarted as outpt (no ACE,  ARB, NSAIDs)...    Since disch he remains weak, doesn't want to eat or do anything according to his son, agitated & confused Qhs & sun-downing;  Foley in- draining clear urine;  They have home health nurse checking on Pt... We decided to try a low dose SEROQUEL 282m1/2 to 1 Qhs for the sun-downing, agitation, etc & it worked well the first night, son will assess need daily... we reviewed & updated the following medical problems during today's office visit >>     COPD, HxPneumonia> on Advair250Bid & Spiriva daily, plus Mucinex600Bid; denies resp exac- continue same meds, avoid infections...    Cards- HBP, CHF, AFib> Hosp 05/2016 w/ INR=6.7, GIB, & AKI- Coumadin held/ reveresed, off Lasix, on MetopER25; BP= 124/88 & they note that Ryan Hess doesn't want BP too low due to his atherosclerosis & the endograft; he saw DrNishan 12/2015-- HBP on Aten25, AAA repair[facility] reviewed, no change in meds...    Peripheral Vasc Dis> [facility] w/ CTA chest/abd/pelvis- note reviewed) & they are to contact pt soon regarding future appts...    Hyperlipidemia> on Atorva10; FLP 3/17 showed TChol 120, TG 98, HDL 36, LDL 65... continue same Rx...    Hypothyroid> on Synthroid50; clinically euthyroid but Labs 3/17 showed TSH= 6.98 & pill count was way off, son to control his meds, still on 5057md w/ TSH 05/2016 = 5.31, continue same for now...    GI- HxDivertics, Polyps, hx ischemic bowel, GIB 05/2016  when PT/INR=6.7> see above; CT[facility] ok; he saw Ryan Hess last 2012 & they decided to forgo any f/u colonoscopies; GIB 05/2016 when INR=6.7 Hosp by [facility] & seen by DrSchooler, no interventions- Coumadin held, reveresed, transfused, etc; no abd pain, n/v, had mod stool burden, melena resolved...    GU- prostate cancer, chr cystitis, CKD4> followed by DrWrenn for Urology, abn finding on CTA Abd/Pelvis w/ post bladder  wall thickening, last seen 10/22/15- cysto 06/2015 w/ inflamm in bladder but no mass, cyto neg, he's had hematuria & prev on 76mosuppressive Keflex for chr cystitis;  ADM 05/2016 w/ GIB, INR=6.7, AKI w/ Cr=5.39, mild left hydro- Foley, IVF & Tx, etc & improved to Cr=4.39; followed by Urology & Neph DrPatel/Deterding not a dialysis cand.    DJD> GTiltonappears to be stable, s/p bilat TKRs, LBP, etc; on Tramadol & Tylenol prn...    Anemia> Labs 3/17 showed Hg= 14.6, MCV= 94; then 05/2016 ADM w/ GIB & INR=6.7, Hg was 8.1 w/ Fe=11 (7%), transfused 2u & needs longer term FeSO4...    Memory Loss, MRI w/ vasc dis, old infarcts, atrophy> we had prev started [name]5=>10 but pt has stopped this med & they do not want to pursue this eval or Rx... EXAM shows Afeb, VSS w/ BP=124/88, O2sat=100% on RA;  HEENT- neg, mallampati1;  Chest- sl decr BS at bases w/ scat rhonchi, no w/r/consolidation;  Heart- irreg AFib, Gr1/6SEM w/o r/g;  Abd- soft, nontender;  Ext- w/o c/c/e;  Neuro- intact w/o focal deficits...   CT Abd&Pelvis 06/17/16>  Lung bases show emphysema & patchy subpleural reticulation, coronary atherosclerosis; no acute intra-abd abn- mild sigm divertics w/o -itis, mod stool burden; Ao atherosclerosis w/ complex thoracoabd aneurysm s/p Ao-bi-iliac stent graft (maxdiameter 7.0 cm w/ celiac/ SMA/ bilat renal art stents in place); nonobstructing renal stone, cysts, bladder wall thickening, hepatic steatosis, L4 compression...   Abd Sonar 06/22/16>  Signs medical renal dis bilat, right renal cyst, foley in bladder, no hydroneph present...  MRI Brain 06/21/16>  No acute ischemia or hemorrhage, mod to severe ventriculomegaly & global parenchymal vol loss, old left cerebellar infarcts, old right thalamic lacunar infarct, attenuated left vertebral & basilar arteries, small vessel dis & ectatic ICAs,   EKG 06/17/16>  Afib, LAD, low voltage limb leads, NSSTTWA...   LABS 05/2016 hosp> reviewed in Epic, pertinent numbers above in  prob list... IMP/PLAN>>  Prob List updated w/ recent GIB, supratherapeutic INR, anemia, AKI, etc as above;  For now has Foley & appt w/ DrWrenn for f/u;  We will recheck labs regarding CBC, renal function, & decision regarding restart of Coumadin for his Afib & vasc dis;  He will also need f/u w/ Nephrology & Cardiology, son indicates too difficult [facility]... we plan short term recheck in 1 week.  ~  July 05, 2016:  1wk ROV recheck & don indicates that GDahmiris sl improved, got a good night's rest w/ Seroquel, although still up & down w/ the Foley cath- removed yest by DrWrenn, using depends, bowel movement wore him out, feels SOB/ weak/ but slowly improving- confusion is better, eating better,etc...  EXAM shows Afeb, VSS w/ BP=126/72, O2sat=98% on RA;  HEENT- neg, mallampati1;  Chest- sl decr BS at bases w/ scat rhonchi, no w/r/consolidation;  Heart- irreg AFib, Gr1/6SEM w/o r/g;  Abd- soft, nontender;  Ext- w/o c/c/e;  Neuro- intact w/o focal deficits...   LABS 06/2016 via office>  Chems- improved w/ BUN=42, Cr=3.27, Alb=3.1;  CBC- improved w/ Hg=10.5.Marland KitchenMarland Kitchen IMP/PLAN>>  Slowly improving w/ foley out, urology following, rec FeSO4 '325mg'$ /d, and we will call coumadin clinic to restart Coumadin but w/ INR target ~2.0;  He has Cards f/u w/ Cherly Hensen at end of the month...  ADDENDUM>>  He saw Cards NP 07/13/16-- post hosp check, noted acute on chr renal failure, consult by DrDeterding, pt declined dialysis & made a DNR; extensive Cards & PV diagnoses, Coumadin recently resumed for his AFib; he had been more SOB w/ incr edema & Lasix was doubled x3d to 80/d + ace wraps for legs; Labs 07/13/16 showed Chems- no ch w/ K=4.3, TCO2=16, BUN=43, Cr=3.39;  CBC- Hg=10.6, MCV=94, PT/INR=1.6 => no changes made... When I was noticed of labs & saw the met acidosis w/ HCO3=16-- I reviewed hosp records and noted that renal had him on BICARB tabs'650mg'$ -2Tid in [place] but not written at disch,  therefore we started HCO3 '650mg'$  tabs 2Bid for now...  ADDENDUM>>  Labs rechecked by Cards 07/19/16-- K=3.1, HCO3=21, BUN=53, Cr=3.54; they rec keep Lasix40/d, add K10/d...   ~  July 19, 2016:  2wk ROV & son Coralyn Mark notes that his edema diminished on the extra Lasix & ACE wrap; he is still quite marginal w/ his advancing renal failure & CHF as the rate-limiting problems; SEE PROB LIST ABOVE...     Underlying COPD on Advair250bid & Spiriva daily, plus Mucinex600Bid; breathing is baseline- mild chest congestion, denies cough/ sputum/ ch in SOB/DOE...    HBP, CHF, AFib, PVD, cerebrovasc dis> on Coumadin '1mg'$  via CC w/ INR goal ~2; last protime 10/26 w/ INR=2.6 & they kept same dose; on MetopER25, Lasix40, K10, NaHCO3-'650mg'$ -2Bid; he has f/u sched w/ Cards...    Medical issues> HL, Hypothy, GI, GU, DJD, Anemia-- on Lip10, Synth50, Seroquel25Qhs, Tramadol50 prn...  EXAM shows Afeb, VSS w/ BP=126/60, O2sat=93% on RA, Wt down to 156#; HEENT- neg, mallampati1;  Chest- sl decr BS at bases w/ scat rhonchi, no w/r/consolidation;  Heart- irreg AFib, Gr1/6SEM w/o r/g;  Abd- soft, nontender;  Ext- w/o c/c/ +tr edema;  Neuro- intact w/o focal deficits, weak, lethargy...   LABS 07/19/16>  K=3.4, HCO3=26, BUN=68, Cr=3.73 and rec to continue same meds, incr fluid intake, maintain f/u coumadin clinic & ROV recheck 2wks...  IMP/PLAN>>  Yaziel is going downhill slowly; Hospice is on-board, supportive care/ comfort Rx; we will recheck in 2 wks...  ~  August 09, 2016:  3wk ROV & Harlyn has had good days (approx 4/7) and bad (approx3/7);  Hospice is on board & he is getting personal care 4d/wk & nursing visits- son indicates that this is very helpful; he also has a hosp bed & all DME needs are met... SOB is unchanged, he notes mild cough, sm amt clear sput, no CP/ tightness/ wheezing/ f/c/s/ etc...    Underlying COPD on Advair250bid & Spiriva daily, plus Mucinex600Bid; breathing is baseline- mild chest congestion, not much  cough/ sputum/ ch in SOB/DOE...    HBP, CHF, AFib, PVD, cerebrovasc dis> on Coumadin '1mg'$  via CC w/ INR goal ~2; on MetopER25, Lasix40, K10, NaHCO3-'650mg'$ -2Bid; he has f/u sched w/ Cards...    Medical issues> HL, Hypothy, GI, GU- renal failure w/ Cr!3.5, DJD, Anemia-- on Lip10, Synth50, Seroquel25Qhs, Tramadol50 prn...  EXAM shows Afeb, VSS w/ BP=126/60, O2sat=100% on RA, Wt down to 152#; HEENT- neg, mallampati1;  Chest- sl decr BS at bases w/ scat rhonchi, no w/r/consolidation;  Heart- irreg AFib, Gr1/6SEM w/o r/g;  Abd-  soft, nontender;  Ext- w/o c/c/ +tr edema;  Neuro- intact w/o focal deficits, weak, lethargy...   LABS 08/09/16>  Stable w/ HCO3=24, K=4.0, Cr=3.6 IMP/PLAN>>  Stable on current meds, continue same & ROV to keep close tabs on his labs in ~6wks...            Problem List:  COPD (ICD-496) Hx of PNEUMONIA, ORGANISM UNSPECIFIED (ICD-486) PULMONARY NODULE & CAVITY RLL (ICD-518.89) - he is an ex-smoker, having quit in [place] after 40 yrs of smoking... he was exercising regularly and walking daily ~58m 5-6 days per week... on AWavelanddaily, +MUCINEX 2Bid w/ Fluids... ~  baseline CXR & CTChest w/ marked emphysema, atheromatous calcif, 5cm desc thorAA...  ~  CXR & CTA 2/11 in hosp showed cardiomeg + coronary calcif, advanced atherosclerosis of Ao w/ aneuryms 5.8cm into abd, underlying emphysema w/ interst edema & bilat effusions, no PE... ~  4/11:  RLL pneumonia which was slow to clear... ~  6/11:  f/u CXR & CT Chest 6/11 w/ 6cm cavitary area RLL & nodular component inferiorly, +hilar adenopathy, no mediastinal nodes, etc; Tumor markers showed CEA=7.5 & Ca19-9= 10.8; > IR felt lesion was too hi risk for bx, therefore contin Rx & observe. ~  8/11:  f/u CXR w/o change in RLL opac (no worsening)>> repeat CEA=6.5; plan f/u CT Chest in 268mo~  10/11: f/u CT Chest showed interval decrease in size of RLL cavitary lesion & the inferiorly placed soft tissue component; otherw there is  severe emphysema, cardiomeg, coronary calcif, thoracoabd aneurysm w/o change; there was an air-fluid level in a dilated esoph> check Ba Esophagram= Nonspecific esophageal motility disorder with very poor primary esophageal contractions (no HH, stricture, etc)... ~  12/12: f/u CTChest showed severe emphysema & scarring w/ bibasilar atx (no infiltrates no cavities), markedly tortuous & ectaticThorAo w/ extensive atherosclerotic changes & no change in decr thor aneurysm measuring 5.9 x 5.6cm, mild cardiomeg & dense coronary calcif as well... ~  4/13:  He had a fenestrated endovasc [facility]dominal [facility] ~  subseq CXRs thru 7/13 hosp w/ cardiomeg, low lung vols, bibasilar airsp dis, etc... ~  CXR 1/14 showed cardiomeg, bibasilar fibrosis, thoracic aortic stent noted, NAD...[name]~  7/14:  ?RUL pneumonia treated w/ Avelox & resolved=> pt [facility]  CXR 10/14 showed chr changes of Ao stent graft, stable cardiomegaly, no infiltrates, NAD...[name]~  5/15: on Advair250, Spiriva, Mucinex; last CT 12/12 w/ severe emphysema & scarring, plus his extensive atherosclerotic dis/ ectatic thorAo/ aneurysm/ etc; his breathing is stable- mild cough, min sput, no hemoptysis, stable DOE, etc. ~  11/15: stable on same meds... ~  [facility]- no change, stable heart size, COPD/Emphysema & mild right basilar atelectasis, NAD...Marland KitchenMarland Kitchen  CT Chest ([facility]/ [facility]elvis[facility] dec[facility] in size of the mid thoracic Ao aneurysm, stable appearance of the 4-vessel fenestrated thoracoabdominal endograft... ~  3/16: on Advair250Bid & Spiriva daily, plus Mucinex600Bid; denies resp exac; [facility]- stable & no changes made...    HYPERTENSION (ICD-401.9)                         << followed by DrCherly Hensenor Cards >> CONGESTIVE HEART FAILURE (ICD-428.0) ATRIAL FIBRILLATION (ICD-427.31) w/ clot in  left atrial appendage >> resolved on f/u TEE,  on COUMADIN per Cards & cardioverted. PREMATURE VENTRICULAR CONTRACTIONS, FREQUENT (ICD-427.69)  MEDS> prev on ATENOLOL '25mg'$ /d, & LASIX '20mg'$ -2AM + KCl 84mqBid, and off Losartan... prev Norvasc discontinued & hx ACE cough in the past on Lisinopril. ~  NuclearStressTest 1/05 was neg- no ischemia or infarct (+diaphrag attenuation), EF=56%... ~  repeat Nuclear study 6/09 was neg- no ischemia, mild inferoapic thinning, not gated due to PVCs... ~  2DEcho 6/09 showed mild dilated LV w/ EF= 45-50% but no regional wall motion abn, mild AoV calcif & AI, LA mild dil... ~  12/10:  changed from [place] to LSpencerto save $$ ~  2/11:  in hosp- Cath= mild nonobstructive 3 vessel CAD, mod LVD w/ EF=40-45%; TEE= CHF w/ EF~45% w/ HK & left atrial appendage clot & AFib; he was diuresed & Norvasc stopped, placed on Coumadin w/ careful f/u by DCherly Hensen& the CC... ~  [facility] w/ PVCs... ~  Recurrent AFib w/ RVR after his Thoracoabdominal stent graft 4/13... ~  6/13:  BP was low assoc w/ GU bleeding & ATENOLOL25 & LOSARTAN50 placed on HOLD... ~  COUMADIN on HOLD since GU bleeding & acute blood loss anemia; it has not yet been restarted... ~  8/13:  BP= 116/78 on LASIX '40mg'$ /d & K10-Bid;  Wt down 14# to 173# ~  10/13:  BP= 114/82 on Lasix40+K1-Bid (he takes Aten25 if BP>110 at home)... ~  12/13: he had Cards f/u DrNishan> note reviewed, no change in meds, he referred him to DrGearhart for f/u stent [facility] ~  5/14: HBP, CHF, AFib w/ hx clot in LA appendage- resolved on Coumadin> on ASA81, Aten25, Lasix40, K10Bid; BP= 114/72 & he denies CP, palpit etc; followed by DCherly Hensenfor Cards- his notes are reviewed, stable- no changes made. ~  8/14: [facility] by Ryan [facility]... ~  5/15: on ASA81, Coumadin, Aten25 (if BP>110), Losartan100, Lasix40; BP= 120/64 & he denies CP, palpit etc;  followed by DCherly Hensenfor Cards- his notes are reviewed, stable- BNP 5/15= 184... ~  7/15: on ASA81, Coumadin, Aten25 (if BP>110), Losartan100, Lasix40; BP= 110/60 & he presented w/ incr left leg swelling after fall; Renal function sl worse w/ Cr=2.0 & rec to decr Losartan100=>50 ~  11/15: on ASA81, Coumadin, Losar50, Lasix40; BP= 130/72 & he denies CP, palpit etc; followed by DCherly Hensenfor Cards- last seen 12/14, stable- no changes made & he is due for f/u visit....Marland KitchenMarland KitchenMarland Kitchen PERIPHERAL VASCULAR DISEASE (ICD-443.9) - back on ASA '81mg'$ /d...  ~  he is s/p infrarenal AAA repair w/ right common iliac aneurysm repair (via Ao Bi-iliac graft) in 1998 by DrLawson; also has a known desc thor AA measuring ~5+cm and followed by DrGearhart...  ~  seen by DrGearhart 4/10 w/ CT scan showing ~5cm thoracoabdominal aneurysm w/o signif change...  ~  seen by DrGearhart 2/11 in hosp w/ sl incr size of AAA- f/u planned 625mo~  also followed by [facility]- DrCooper. ~  seen by DrGearhardt 10/11 & stable, no change, f/u 1 yr. ~  Seen 12/12 by DrGearhardt & CTscan is stable, continue conservative approach... ~  2/13:  [facility]... ~  4/13:  He had a [facility]estrated [facility] lumbar epid[facility] hemat[facility] (on Coumadin) w/ paraplegia requiring readmission for laminectomy & evac of the hematomas;  He was sent to [facility] for rehab 4/18 - 01/30/12 and then [facility] but only spent 4d there before he had to be readmit to [facility] 5/11 - 02/09/12 by [facility] w/ altered mental status, pneumonia, AFib w/ rvr, & FTT> Neuro w/u was otherw neg (& he improved w/ supportive care)... ~  2/14:  CT Chest, Abdomen, Pelvis> per Ryan Hess - see report (the abdominal component has enlarged)... 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 DrFarbers note indicates that to finish the repair he will require 3 branches and a left renal fenestration, they quoted him a risk of paraplegia in the 10-15% range & he will need preadmission for a lumbar drain to be placed, etc; he is awaiting the call from [facility]... ~  7/14: s/p surg at [facility] by Ryan [facility] w/ 5 stents placed 04/15/13 in a >4h operation w/ special spinal precautions- he developed a blood clot in his left arm w/ some persist swelling; he has reg f/u at [facility]... ~  He maintains a regular sched of f/u visits at [facility] w/ XRays, CT scans, dopplers, etc on a regular basis- results reviewed in Care Everywhere section of Epic...  VENOUS INSUFFICIENCY, EDEMA >>  ~  He has chronic venous insuffic w/ mild chronic edema maintained on a low sodium diet, elevation, support hose when nec, & LASIX'40mg'$ /d... ~  7/15:  Presented 2wks after fall at home, left leg red/ swollen/ sl tender, no broken skin/ drainage/ not hot etc; VenDopplers showed NEG for DVT & Protime was too thin... ~  11/15: edema resolved and back to baseline...  HYPERCHOLESTEROLEMIA (ICD-272.0) - on LIPITOR '10mg'$ /d...  ~  [place] 5/07 showed TChol 115, Tg 58, HDL 36, LDL 67 ~  FLP 5/08 showed TChol 118, TG 66, HDL 31, LDL 74 ~  FLP 5/09 showed TChol 126, TG 71, HDL 37, LDL 75 ~  FLP 6/10 showed TChol 116, TG 73, HDL 40, LDL 62 ~  2/11:  FLP not checked during the hospitalization... ~  FLP 4/11 in hosp showed TChol 94, TG 52, HDL 23, LDL 61 ~  FLP 12/12 on Lip10 showed TChol 107, TG 38, HDL 40, LDL 60 ~  FLP 3/16 on Lip10 showed TChol 133, TG 82, HDL 43, LDL 74  HYPOTHYROIDISM >> he remains clinically euthyroid... ~  Labs 12/12 showed TSH= 6.10 ~  Labs 6/13 showed TSH= 6.66 ~  Labs 5/14 showed TSH= 6.21... We decided to start SYNTHROID 73mg/d... ~  Labs 5/15 on  Levothy50 showed TSH= 1.92 ~  Labs 3/16 on Synthroid50 showed TSH= 2.82  DIVERTICULOSIS OF COLON (ICD-562.10) & COLONIC POLYPS (ICD-211.3) - hx polyps in 2003 = tubular adenoma... ~  last colonoscopy 9/06 showed divertics, hems, no polyps. ~  Pt followed by Ryan Hess & they decided to forgo any further GI procedures/ colonoscopies...  ISCHEMIC BOWEL >> GDonyapresented to the ER 7/23 - 04/25/12 w/ abd pain- ELap surg revealed ~5 feet of frankly ischemic small bowel, likely due to internal hernia and closed loop obstruction=> Bowel resected & pulses intact w/ no evidence of embolic or thrombotic pathology;  Viable small bowel anastomosed & wound vac applied; he had a stable post op course...  RENAL INSUFFICIENCY >>  PROSTATE CANCER (ICD-185) - eval by DrWrenn and they decided on XRT by DrKinard- finished 5/09 and he states that DrKinard "released me"... he sees DrWrenn every 6 months & they follow PSA closely (notes reviewed)... ~  8/12: f/u prostate cancer dx in 2009 & treated w/ XRT completed 6/09; slowly rising PSA w/ doubling time 6-386mo min voiding symptoms, he is considering androgen ablation if needed... ~  6/13: He developed hematuria & urinary retention requiring Urology eval, discontinuation of Coumadin, & Foley placement=> subseq removed 8/13 w/ adeq voiding trial... ~  3/14:  He had f/u visit w/ DrWrenn- PSA nadir after XRT was <0.04 and it has climbed to 0.54 (3/14) w/ PSADT of ~158yrthey chose to continue watchful waiting... ~  1/15:  DrWrenn reports that PSA is up to 1.46 ~  Labs 5/15 on Lasix40 showed Cr=1.6, BNP=184 ~  7/15: he had f/u w/ DrWrenn> BPH w/ BOO, Prostate ca; on Flomax0.4, PSA up to 1.84, neg bone scan x degen changes; they continue to follow w/ watchful waiting, ROV in 86m84mo ~  He maintains regular f/u w/ drWrenn> we do not have recent clinic notes but pt indicates that everthing is ok... ~  3/16: [facility] & [place]  thickening & trabeculation of the bladder wall & prox ascending bowel wall thickening... NOTE> they rec f/u w/ Urology & records sent in light of these findings... ~  Labs 3/16 showed BUN=32, Cr= 1.85...  DEGENERATIVE JOINT DISEASE (ICD-715.90) - s/p bilat TKR's, [facility]- DrOlin... He has OXY-IR '5mg'$  as needed for pain...  MEMORY LOSS, SENILE DEMENTIA, Vasc disease/ infarcts on MRI/ global atrophy >> he tried [name] in the past & they stopped it... 05/2016> he's been sundowning & we prescribed Seroquel '25mg'$ - 1/2 to 1 tab as needed...   ACTINIC SKIN DAMAGE (ICD-692.70) - followed by DrHBrunetta Jeanse knows to avoid sun exposure, use sun screen, etc...  ANEMIA >> ~  Labs 6/13 showed Hg= 8.4, Fe= 31 (11%sat); Rec to start FeSO4 Bid... ~  Labs 7/13 in hosp showed Hg= 12.6=>8.2 at disch... ~  Labs 8/13 showed Hg= 9.5, MCV= 94; rec to continue FeSO4 Bid... ~  Labs 10/13 showed Hg= 13.2 & ok to wean Fe to 1/d til gone then stop... ~  Labs 5/14 showed Hg= 14.2 ~  Labs 5/15 showed Hg= 13.2 ~  Labs 3/16 showed Hg= 14.4   Past Surgical History:  Procedure Laterality Date  . ABDOMINAL AORTIC ANEURYSM REPAIR  1998  . APPENDECTOMY    . APPLICATION OF WOUND VAC  04/18/2012   Procedure: APPLICATION OF WOUND VAC;  Surgeon: ThoJoyice Fasterornett, MD;  [facility];  Laterality: N/A;  Removal of abdominal wound vac, Application of incisional  wound vac  . INGUINAL HERNIA REPAIR     left  . JOINT REPLACEMENT    . LAPAROTOMY  04/16/2012   Procedure: EXPLORATORY LAPAROTOMY;  Surgeon: BriMadilyn HookO;  [facility];  Laterality: N/A;  exploratory Laparotomy,Small bowel ressection abdominal wound vac placement.  . LMarland KitchenPAROTOMY  04/18/2012   Procedure: EXPLORATORY LAPAROTOMY;  Surgeon: ThoJoyice Fasterornett, MD;  [facility];  Laterality: N/A;  exploratory laparotomy with small bowel anastomosis  . repair of AA  04/14/2013   [facility]ROP[facility]     bilateral    Outpatient Encounter Prescriptions as of 08/09/2016  Medication Sig Dispense  Refill  . ferrous sulfate 325 (65 FE) MG EC tablet Take 325 mg by mouth daily with breakfast.    . Fluticasone-Salmeterol (ADVAIR) 250-50 MCG/DOSE AEPB Inhale 1 puff into the lungs 2 (two) times daily.    . furosemide (LASIX) 40 MG tablet Take 40 mg by mouth daily.    [name] guaiFENesin (MUCINEX) 600 MG 12 hr tablet Take 600 mg by mouth 2 (two) times daily. COPD.    [name] levothyroxine (SYNTHROID, LEVOTHROID) 50 MCG tablet TAKE 1 TABLET BY MOUTH DAILY 90 tablet 0  . metoprolol succinate (TOPROL XL) 25 MG 24 hr tablet Take 1 tablet (25 mg total) by mouth daily. 90 tablet 3  . Multiple Vitamin (MULTIVITAMIN) tablet Take 1 tablet by mouth daily.      [name] neomycin-polymyxin b-dexamethasone (MAXITROL) 3.5-10000-0.1 OINT Place 1 application into both eyes as needed (for eye irriation or itching).    . potassium chloride (K-DUR) 10 MEQ tablet Take 2 tablets by mouth on day 1 then 1 tablet by mouth daily thereafter 91 tablet 1  . QUEtiapine (SEROQUEL) 25 MG tablet Take 1 tablet (25 mg total) by mouth at bedtime. 30 tablet 5  . sodium bicarbonate 650 MG tablet Take 2 tablets by mouth two times daily 120 tablet 5  . tiotropium (SPIRIVA) 18 MCG inhalation capsule Place 18 mcg into inhaler and inhale daily.    . traMADol (ULTRAM) 50 MG tablet Take 1 tablet (50 mg total) by mouth 3 (three) times daily as needed. for pain 90 tablet 0  . warfarin (COUMADIN) 1 MG tablet Take 1 tablet (1 mg total) by mouth as directed. 30 tablet 2  . [DISCONTINUED] atorvastatin (LIPITOR) 10 MG tablet Take 10 mg by mouth daily.     No facility-administered encounter medications on file as of 08/09/2016.     No Known Allergies   Current Medications, Allergies, Past Medical History, Past Surgical History, Family History, and Social History were reviewed in [employer] record.   Review of Systems         See HPI - all other systems neg except as noted...  The patient complains of dyspnea on exertion.  The patient denies anorexia, fever, weight loss, weight gain, vision loss, decreased hearing, hoarseness, chest pain, syncope, peripheral edema, prolonged cough, headaches, hemoptysis, abdominal pain, melena, hematochezia, severe indigestion/heartburn, hematuria, incontinence, muscle weakness, suspicious skin lesions, transient blindness, difficulty walking, depression, unusual weight change, abnormal bleeding, enlarged lymph nodes, and angioedema.     Objective:   Physical Exam    WD, WN, 82 y/o WM in NAD... GENERAL:  Alert & oriented; pleasant & cooperative; pale complexion... HEENT:  [place]/AT, EOM-wnl, PERRLA, EACs-clear, TMs-wnl, NOSE-clear, THROAT-clear & wnl. NECK:  Supple w/ fairROM; no JVD; normal carotid impulses w/o bruits; no thyromegaly or nodules palpated; no lymphadenopathy. CHEST:  decr BS bilat, scat bibasilar rales, w/o wheezing/ rhonchi/ signs of consolidation. HEART:  irregular rhythm, gr1/6 diast murmur in Ao area, without rubs or gallops detected... ABDOMEN:  Soft & nontender; normal bowel sounds; no organomegaly or masses palpated... EXT: s/p bilat TKR's, mod arthritic changes; +venous insuffic, left leg swelling/ red > right leg, no drainage NEURO:  CN's intact;  gait abn; no focal neuro deficits... DERM:  No lesions noted; no rash etc...  RADIOLOGY DATA:  Reviewed in the EPIC EMR & discussed w/ the patient...  LABORATORY DATA:  Reviewed in the EPIC EMR & discussed w/ the patient...   Assessment:      COPD/Emphysema, Hx Pneumonia>  baseline severe COPD/Emphysema w/ Cardiomeg, Aneurysm & bibasilar fibrosis, stable on Advair250, Spiriva, Mucinex...  HBP>  HBP, CHF, AFib> Coumadin on HOLD after GIB, off ASA81, off Losar25, on MetopER25, & Lasix40 on HOLD; BP= 124/88 range & they note that Ryan Hess doesn't want it too low due to his atherosclerosis & the endograft; he  last saw [facility] 12/2015... 07/19/16>  Continue Lasix40, K10, Bicarb tabs-- Hospice is on board as he is a DNR & refused dialysis; recheck pt in 2 wks  CAD/ CHF/ etc>  Followed by Cherly Hensen & his prev notes are reviewed;  BNP 12/14 was up to 4200 & on Lasix40 it has improved to 184 by 5/15; 3/17 it measures 462 & reminded low sodium...Marland KitchenMarland KitchenMarland Kitchen  AFib>  On above + Coumadin followed in the CC but Protime became supratherapeutic 05/2016 causing GIB, AKI, etc; Coumadin on hold now... 07/05/16>  We discussed restart Coumadin w/ reduced INR goal ~2.0 07/20/16>  Followed by CC & INR=2.6, they are adjusting & following...  Periph Vasc Dis>  S/p AAA repair 1998 by Sheryn Bison; then followed by DrGearhardt for thoraco-abd aneurysm & referred to [facility] for stent graft surg- done 4/13 by Ryan Hess w/ complications, and 2nd stage of the surgery 7/14 w/ 5 stents placed per Ryan Hess... They continue to follow regularly w/ CT scans every 49mo..  Ven Insuffic & Edema>  Prev improved w/ Lasix40/d;  He knows to elim salt, elev legs, wear support hose, etc;  He fell 7/15 w/ bruise reported, then followed by redness in skin & incr left leg swelling=> VenDoppler is neg for DVT & Protime was too thin=> adjusted; swelling resolved over time & back to baseline...  CHOL>  FLP looks good on Lip10...  Subclinical Hypothyroid>  TSH was 6.21 and Synthroid50 started 5/14=> TSH improved to 2.82...  GI> Presbyesoph, Divertics, Hx polpyps>  Stable & followed by Ryan Hess... 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 05/2016>  ADM w/ GIB due to INR=6.7; seen by DrSchooler, no intervention; treated w/ PPI but not disch on one....Marland KitchenMarland KitchenMarland Kitchen Prostate Cancer>  Followed by DrWrenn  w/ slowly rising PSA as noted; then developed urinary retention after lumbar surg and hematuria after foley & coumadin; now back to baseline & being followed... 9/16> there is  a [facility] given to pt to take to f/u appt w/ DrWrenn... 9/17> he is followed by DrWrenn- hx prostate Ca w/ prev XRT 2009 & PSA recurrence (PSA=2.8 [id number] nadir <0.04); Hosp w/ GIB, had UTI w/ Klebs, urinary retention, & foley placed, outpt management by Urology...  DJD>  Followed by DrOlin, s/p bilat TKRs...  ANEMIA>  He developed acute blood loss anemia w/ post op Hg= 11-12 but dropped to 8.6 w/ hematuria & INR=4.2; Coumadin & ASA placed on HOLD; f/u labs showed Hg=8.4 & Fe=31 & started on FeSO4 Bid;  Then Hg=9.5 & rec to continue Fe Bid;  Cards restarted his Coumadin;  Now Hg= 14.4  05/2016> he was HOSP by [facility] w/ GIB/melena, INR=6.7, Hg=8.1, low Fe- given 2u Tx & rec for OTC Fe 1/d...  Actinic skin changes>  Aware, he is monitored by Derm...     Plan:     Patient's Medications  New Prescriptions   No medications on file  Previous Medications   FERROUS SULFATE 325 (65 FE) MG EC TABLET    Take 325 mg by mouth  daily with breakfast.   FLUTICASONE-SALMETEROL (ADVAIR) 250-50 MCG/DOSE AEPB    Inhale 1 puff into the lungs 2 (two) times daily.   FUROSEMIDE (LASIX) 40 MG TABLET    Take 40 mg by mouth daily.   GUAIFENESIN (MUCINEX) 600 MG 12 HR TABLET    Take 600 mg by mouth 2 (two) times daily. COPD.   LEVOTHYROXINE (SYNTHROID, LEVOTHROID) 50 MCG TABLET    TAKE 1 TABLET BY MOUTH DAILY   METOPROLOL SUCCINATE (TOPROL XL) 25 MG 24 HR TABLET    Take 1 tablet (25 mg total) by mouth daily.   MULTIPLE VITAMIN (MULTIVITAMIN) TABLET    Take 1 tablet by mouth daily.     NEOMYCIN-POLYMYXIN B-DEXAMETHASONE (MAXITROL) 3.5-10000-0.1 OINT    Place 1 application into both eyes as needed (for eye irriation or itching).   POTASSIUM CHLORIDE (K-DUR) 10 MEQ TABLET    Take 2 tablets by mouth on day 1 then 1 tablet by mouth daily thereafter   QUETIAPINE (SEROQUEL) 25 MG TABLET    Take 1 tablet (25 mg total) by mouth at bedtime.   SODIUM BICARBONATE 650 MG TABLET    Take 2  tablets by mouth two times daily   TIOTROPIUM (SPIRIVA) 18 MCG INHALATION CAPSULE    Place 18 mcg into inhaler and inhale daily.   TRAMADOL (ULTRAM) 50 MG TABLET    Take 1 tablet (50 mg total) by mouth 3 (three) times daily as needed. for pain   WARFARIN (COUMADIN) 1 MG TABLET    Take 1 tablet (1 mg total) by mouth as directed.  Modified Medications   No medications on file  Discontinued Medications   ATORVASTATIN (LIPITOR) 10 MG TABLET    Take 10 mg by mouth daily.

## 2016-08-16 ENCOUNTER — Ambulatory Visit (INDEPENDENT_AMBULATORY_CARE_PROVIDER_SITE_OTHER): Payer: Medicare Other | Admitting: Cardiovascular Disease

## 2016-08-16 DIAGNOSIS — I481 Persistent atrial fibrillation: Secondary | ICD-10-CM

## 2016-08-16 DIAGNOSIS — I4819 Other persistent atrial fibrillation: Secondary | ICD-10-CM

## 2016-08-16 LAB — POCT INR: INR: 1.6

## 2016-08-24 ENCOUNTER — Ambulatory Visit: Payer: Medicare Other | Admitting: Cardiovascular Disease

## 2016-08-30 ENCOUNTER — Ambulatory Visit (INDEPENDENT_AMBULATORY_CARE_PROVIDER_SITE_OTHER): Payer: Medicare Other | Admitting: Cardiovascular Disease

## 2016-08-30 DIAGNOSIS — I481 Persistent atrial fibrillation: Secondary | ICD-10-CM

## 2016-08-30 DIAGNOSIS — I4819 Other persistent atrial fibrillation: Secondary | ICD-10-CM

## 2016-08-30 LAB — POCT INR: INR: 1.5

## 2016-09-08 ENCOUNTER — Ambulatory Visit (INDEPENDENT_AMBULATORY_CARE_PROVIDER_SITE_OTHER): Payer: Medicare Other | Admitting: Pharmacist Clinician (PhC)/ Clinical Pharmacy Specialist

## 2016-09-08 DIAGNOSIS — I481 Persistent atrial fibrillation: Secondary | ICD-10-CM

## 2016-09-08 DIAGNOSIS — I4819 Other persistent atrial fibrillation: Secondary | ICD-10-CM

## 2016-09-08 LAB — POCT INR: INR: 1.5

## 2016-09-13 ENCOUNTER — Other Ambulatory Visit (INDEPENDENT_AMBULATORY_CARE_PROVIDER_SITE_OTHER): Payer: Medicare Other

## 2016-09-13 ENCOUNTER — Encounter: Payer: Self-pay | Admitting: Pulmonary Disease

## 2016-09-13 ENCOUNTER — Ambulatory Visit (INDEPENDENT_AMBULATORY_CARE_PROVIDER_SITE_OTHER): Payer: Medicare Other | Admitting: Pulmonary Disease

## 2016-09-13 VITALS — BP 118/60 | HR 80 | Temp 96.7°F | Ht 69.0 in | Wt 152.0 lb

## 2016-09-13 DIAGNOSIS — I10 Essential (primary) hypertension: Secondary | ICD-10-CM

## 2016-09-13 DIAGNOSIS — I4819 Other persistent atrial fibrillation: Secondary | ICD-10-CM

## 2016-09-13 DIAGNOSIS — D631 Anemia in chronic kidney disease: Secondary | ICD-10-CM

## 2016-09-13 DIAGNOSIS — J449 Chronic obstructive pulmonary disease, unspecified: Secondary | ICD-10-CM

## 2016-09-13 DIAGNOSIS — N185 Chronic kidney disease, stage 5: Secondary | ICD-10-CM | POA: Insufficient documentation

## 2016-09-13 DIAGNOSIS — I5022 Chronic systolic (congestive) heart failure: Secondary | ICD-10-CM | POA: Diagnosis not present

## 2016-09-13 DIAGNOSIS — I481 Persistent atrial fibrillation: Secondary | ICD-10-CM | POA: Diagnosis not present

## 2016-09-13 DIAGNOSIS — N189 Chronic kidney disease, unspecified: Secondary | ICD-10-CM | POA: Insufficient documentation

## 2016-09-13 DIAGNOSIS — J4489 Other specified chronic obstructive pulmonary disease: Secondary | ICD-10-CM

## 2016-09-13 LAB — CBC WITH DIFFERENTIAL/PLATELET
Basophils Absolute: 0 10*3/uL (ref 0.0–0.1)
Basophils Relative: 0.4 % (ref 0.0–3.0)
Eosinophils Absolute: 0.2 10*3/uL (ref 0.0–0.7)
Eosinophils Relative: 2.1 % (ref 0.0–5.0)
HCT: 35.3 % — ABNORMAL LOW (ref 39.0–52.0)
Hemoglobin: 11.8 g/dL — ABNORMAL LOW (ref 13.0–17.0)
Lymphocytes Relative: 11.7 % — ABNORMAL LOW (ref 12.0–46.0)
Lymphs Abs: 1 10*3/uL (ref 0.7–4.0)
MCHC: 33.6 g/dL (ref 30.0–36.0)
MCV: 95 fl (ref 78.0–100.0)
Monocytes Absolute: 0.9 10*3/uL (ref 0.1–1.0)
Monocytes Relative: 10.3 % (ref 3.0–12.0)
Neutro Abs: 6.3 10*3/uL (ref 1.4–7.7)
Neutrophils Relative %: 75.5 % (ref 43.0–77.0)
Platelets: 165 10*3/uL (ref 150.0–400.0)
RBC: 3.71 Mil/uL — ABNORMAL LOW (ref 4.22–5.81)
RDW: 15.2 % (ref 11.5–15.5)
WBC: 8.3 10*3/uL (ref 4.0–10.5)

## 2016-09-13 LAB — BASIC METABOLIC PANEL
BUN: 56 mg/dL — ABNORMAL HIGH (ref 6–23)
CO2: 19 mEq/L (ref 19–32)
Calcium: 8.7 mg/dL (ref 8.4–10.5)
Chloride: 107 mEq/L (ref 96–112)
Creatinine, Ser: 4.07 mg/dL — ABNORMAL HIGH (ref 0.40–1.50)
GFR: 14.74 mL/min — CL (ref >60.00)
Glucose, Bld: 88 mg/dL (ref 70–99)
Potassium: 3.8 mEq/L (ref 3.5–5.1)
Sodium: 140 mEq/L (ref 135–145)

## 2016-09-13 NOTE — Patient Instructions (Signed)
Today we updated your med list in our EPIC system...    Continue your current medications the same...  Today we rechecked your blood work...    We will contact you w/ the results when available...   As long as things remain stable--    Let's plan a follow up recheck in 2 months.Ryan KitchenMarland Hess

## 2016-09-13 NOTE — Progress Notes (Signed)
Subjective:     Patient ID: Ryan Hess, male   DOB: Apr 16, 1924, 82 y.o.   MRN: [id number]  HPI 82 y/o WM here for a follow up visit... he has multiple medical problems as noted below...   ~  SEE PREV EPIC NOTES FOR OLDER DATA >>     5/14:  Ryan Hess is awaiting a call from DrFarber at [facility] about the next stage in his planned thoracoabd aneurysm repair- he had his situation reviewed by DrBrabham here & he rec f/u at [facility] per the original plan since this needs to be done endovascularly & is quite complicated...   LABS 5/14:  Chems- ok w/ stable mild RI (Cr=1.5);  CBC- wnl w/ Hg=14.2;  TSH=6.21 (Synthroid50 started);  BNP=263;  Protimes adjusted by the CC...   Ryan Hess has had his f/u surg at [facility] by [employer] w/ 5 stents placed 04/15/13 in a >4h operation w/ special spinal precautions; he developed a blood clot in his left arm w/ some persist swelling...  CXR 10/14 showed chr changes of Ao stent graft, stable cardiomegaly, no infiltrates, NAD...   LABS 10/14:  CBC- wnl;  Protimes followed in CC...    LABS 5/15:  Chems- ok x BUN=26 Cr=1.6;  CBC- wnl w/ Hg=13.2;  TSH=1.92 on Levothy50;  BNP=184...   LABS 7/15:  Chems- ok x BUN=33 Cr=2.0;  CBC- ok w/ Hg=12.2 WBC=6.9;  Protime=49sec, INR=4.8;  Sed=37  D-Dimer=2.50  CRP=4.4  VenDopplers 7/15 showed NEG for DVT...   ~  December 14, 2014:  67moROV & [name] a follow up [facility] and [facility]> see details below but everything stable and no med changes required:  CXR 1/16 revealed stable thoracoabdominal aortic endograft- no change, stable heart size, COPD/Emphysema & mild right basilar atelectasis, NAD...  Abd XRay 1/16 showed 4-vessel fenestrated thoracoabdominal aortic endograft w/ branch stents in celiac/ SMA/ & bilat renal arts- all unchanged, extensive DDD in spine, L4 compression- all unchanged...   Renal/ Mesenteric Ultrasound 1/16> no evid of hemodynamically signif RAS  noted bilat, right renal cyst, distal SMA stent/ mid & distal SMA were normal, celiac & prox SMA stent not visualized...   CT Chest/ Abd/ Pelvis 1/16 w/o contrast> stable to sl decr in size of the mid thoracic Ao aneurysm, stable appearance of the 4-vessel fenestrated thoracoabdominal endograft; thickening & trabeculation of the bladder wall & prox ascending bowel wall thickening... NOTE> they rec f/u w/ Urology & GI in light of these findings... Ryan Hess [facility] very [name] he states he is feeling well, now 82y/o, and denies CP, palpit, dizzy, change is dyspnea, & no edema; he is able to walk to the mailbox slowly & does ok- this is stable; he notes min cough/ beige sput/ no hemoptysis or f/c/s; he notes that he is a little more forgetful & son confirms some progressive decline in memory- they are ready to try [name] Rx (start w/ 521md 7 incr to 1058m)...     COPD, HxPneumonia> on Advair250Bid & Spiriva daily, plus Mucinex600Bid; denies resp exac- continue same, avoid infections...    Cards- HBP, CHF, AFib> on Coumadin via CC, ASA81, Losar100, Lasix40; BP= 122/60 & they note that DrFarber doesn't want it too low due to his atherosclerosis & the endograft; he is overdue for Cards follow up visit...     Peripheral Vasc Dis> see above...    Hyperlipidemia> on Atorva10; FLP today  showed TChol 133, TG 82, HDL 43, LDL 74... continue same Rx...    Hypothyroid> on Synthroid50; clinically euthyroid & Labs 3/16 showed TSH= 2.82...    GI- HxDivertics, Polyps, ischemic bowel> see above; CT Abd&Pelvis w/o contrast 1/16 at [facility] w/ focal thickening in bowel wall in Prox asc colon; he saw DrDBrodie last 2012 & they decided to forgo any f/y colonoscopies for the obvious reasons; we will follow clinically- watch blood counts and stools, DrFarber plans f/u CT scans in another 93mo..     GU- prostate cancer and RI> he is followed by DrWrenn for Urology & seen several weeks ago by his hx (we do  not have notes from them); pt reports everything was good & [facility] Urologist's attn...    DJD> [name] to be stable, s/p bilat TKRs, on Tramadol & tylenol prn...    Anemia> Labs today showed Hg= 14.4, MCV= 94, all back to wnl... We reviewed prob list, meds, xrays and labs> see below for updates >>   LABS 3/16:  FLP- at goals on Atorva10;  Chems- ok w/ Cr improved to 1.85;  CBC- wnl;  TSH=2.82 on Synthroid50... PLAN>>  We decided to start [name] 5=>171md for his vascular dementia & memory loss;  He will f/u w/ Cards at his earliest convenience; [facility] will [facility] & scan report to DrWrenn, Urology to review...   ~  June 16, 2015:  28m29moV & Ryan Hess that he is doing well- feeling good, min cough, no sput, no blood, stable SOB/DOE w/o change; he ambulates w/ a cane & needs more exercise, we discussed this- not having any CP, palpit, edema...  [facility]-- no changes made & they are continuing 28mo 35mo; they did rec f/u by Urology due to CT scan findings and he has an upcoming appt w/ DrWrenn...   CXR 8/16> similar appearance to the thoracoabd endograft, lungs appear unchanged...   Abd film 8/16> similar appearance of the endograft w/o kink/ stenosis/ or other complic evident; degen changes in the spine...  Renal/ Mesenteric Ultrasound 8/16> no evid of renal art stenosis bilat w/ patent stents; norm hepatic & mesenteric art findings w/ patent SMA stent  CTA Chest/ Abd/ Pelvis 8/16> no change in appearance of the Aortic fenestrated endograft spaning the desc aorta to the infrarenal AA w/ stent limbs to the celiac art, SMA, bilat renal arts; 2 areas of aneurysmal dilatation- mid-thor sac measures 5.8cm (prev 5.5cm), & upper abd aneurysm sac measures 7.2cm (prev 7.5cm); apical scarring & severe centrilob & paraseptal emphysema, 1cm RUL nodule unchanged, no adenopathy; right renal cyst & irreg enhancing  post bladder wall thickening which is new; radiotherapy seeds in prostate; esoph is patulous w/ A/F level distally; DJD & L4 compression w/o change... COPY TO PT TO SHOW TO Dr. WrennJeffie PollockLABS [facility]:  BUN=31, Cr=1.87 EXAM shows Afeb, VSS, O2sat=97% on RA;  HEENT- neg, mallampati1;  Chest- sl decr BS at bases w/ scat rhonchi, no w/r/consolidation;  Heart- irreg AFib, Gr1/6SEM w/o r/g;  Abd- soft, nontender;  Ext- w/o c/c/e;  Neuro- intact...    COPD, HxPneumonia> on Advair250Bid & Spiriva daily, plus Mucinex600Bid; denies resp exac- continue same, avoid infections...    Cards- HBP, CHF, AFib> on Coumadin via CC, ASA81, Losar50, Lasix40; BP= 116/50 & they note that DrFarber doesn't want it too low due to his atherosclerosis & the endograft;  he is overdue for Cards follow up visit...     Peripheral Vasc Dis> see [facility]...    Hyperlipidemia> on Atorva10; FLP 3/16 showed TChol 133, TG 82, HDL 43, LDL 74... continue same Rx...    Hypothyroid> on Synthroid50; clinically euthyroid & Labs 3/16 showed TSH= 2.82...    GI- HxDivertics, Polyps, ischemic bowel> see above; CT[facility]Pelvis 8/16 at [facility] looked ok; he saw DrDBrodie last 2012 & they decided to forgo any f/u colonoscopies; we will follow clinically- watch blood counts and stools...    GU- prostate cancer and RI> he is followed by DrWrenn for Urology & he has up coming appt to address the abn finding on CTA Abd/Pelvis w/ post bladder wall thickening...    DJD> Ryan Hess appears to be stable, s/p bilat TKRs, on Tramadol & Tylenol prn...    Anemia> Labs 3/16 showed Hg= 14.4, MCV= 94, all back to wnl...    Memory Loss> we had prev started [name]5=>10 but pt has stopped this med & they do not want to pursue this eval or Rx... IMP/PLAN>>  We sent a copy of his CT Chest/ Abd/ Pelvis from [facility] Everywhere report) with the pt to give to DrWrenn; he will continue current meds and incr exercise; overdue for Cards f/u & they will call; Given  2016 FLU shot today...  ~  December 14, 2015:  62moROV & Ryan Hess reports that he is doing OK overall- he has developed a small skin ulser over his sacrum 7 we discussed avoiding pressure on this area (use donut), wash/ gauze/ vaseline or desitin/ etc, they will call if not resolving... He reports breathing is good- sl cough, sm amt yellow sput, no f/c/s, no CP & stable SOB/DOE (no change...  EXAM shows Afeb, VSS, O2sat=97% on RA;  HEENT- neg, mallampati1;  Chest- sl decr BS at bases w/ scat rhonchi, no w/r/consolidation;  Heart- irreg AFib, Gr1/6SEM w/o r/g;  Abd- soft, nontender;  Ext- w/o c/c/e;  Neuro- intact...    COPD, HxPneumonia> on Advair250Bid & Spiriva daily, plus Mucinex600Bid; denies resp exac- continue same, avoid infections...    Cards- HBP, CHF, AFib> on Coumadin via CC, ASA81, Aten25, Losar25, Lasix40; BP= 112/70 & they note that DrFarber doesn't want it too low due to his atherosclerosis & the endograft; he saw DrNishan 11/15/15-- HBP, AAA repair, [facility] on cou[facility]- he restarted Aten25 for nodal blockade..    Peripheral Vasc Dis> [facility]last seen in Care Everywhere 04/2015 w/ CTA chest/abd/pelvis- note reviewed)...    Hyperlipidemia> on Atorva10; FLP 3/17 showed TChol 120, TG 98, HDL 36, LDL 65... continue same Rx...    Hypothyroid> on Synthroid50; clinically euthyroid but Labs 3/17 showed TSH= 6.98 so we rec incr Synthroid 7173m/d...    GI- HxDivertics, Polyps, ischemic bowel> see above; [facility] he saw DrDBrodie last 2012 & they decided to forgo any f/u colonoscopies; we will follow clinically- watch blood counts and stools...    GU- prostate cancer, RI, chr cystitis> he is followed by DrWrenn for Urology, abn finding on CTA Abd/Pelvis w/ post bladder wall thickening, last seen 10/22/15- cysto 06/2015 w/ inflamm in bladder but no mass, cyto neg, he's had hematuria & prev on 73m71moppressive Keflex for chr cystitis,  they are following closely...    DJD> GeoAbhinavpears to be stable, s/p bilat TKRs, LBP, etc; on Tramadol & Tylenol prn...Marland KitchenMarland Kitchen  Anemia> Labs 3/17 showed Hg= 14.6, MCV= 94, all back to wnl...    Memory Loss> we had prev started [name]5=>10 but pt has stopped this med & they do not want to pursue this eval or Rx...  EKG 11/15/15>  Chr AFib, rate114, LAD, PVC, NSSTTWA...  CXR 12/14/15>  Norm heart size, extensive Ao stent graft in place, coarse interstitial markings bilat- NAD= no infiltrate/ masses/ fluid/ etc...  LABS 12/14/15>  FLP-  At goals on Lip10;  Chems- ok x BUN=32, Cr=2.00, AlkPhos=235 & GGT=119 (7-51);  BNP=462 on Lasix40;  CBC- wnl w/ Hg=14.6, wbc=11.6;  TSH=6.98 IMP/PLAN>>  Ryan Hess is 83 y/o 7 remarkably stable for all he's been through- rec to continue current meds, continue regular follow up visits, avoid infections, stay active, & he will call for any problems in the interval...  ~  June 29, 2016:  71moROV & post hosp check>  [facility] due to melena, weakness, hypotension, AKI w/ BUN=141, Cr=5.39 (from his baseline of 32/2.0) , UTI w/ sens Klebs, and anemic w/ Hg=8.1 (from baseline~14), mcv=92, Fe=11 (7%sat), stool heme pos & his INR was 6.7 (Coumadin held by CC several days prior to adm);  Rx w/ VitK, 2uPCs, PPI, and Coumadin held;  GI consulted- they decided against endoscopic studies or further testing;  He had an unresponsive episode during the hosp (occured after a bowel movement & felt to be vagally mediated)- MRI Brain was NEG for acute abn;  Urology was consulted for mild hydronephrosis, hx prostate Ca w/ prev XRT 2009 & PSA recurrence (PSA=2.8 [id number] nadir <0.04)- foley placed & will be managed as outpt;  Nephrology also consulted- prev Losartan & Lasix were held; Klebs UTI treated w/ Cephalosporin x10d;  AKI (hypotension, GIB, meds, mild hydro) and CKD4- not a dialysis cand, he was treated w/ IVF, bicarb, may need Lasix restarted as outpt (no ACE,  ARB, NSAIDs)...    Since disch he remains weak, doesn't want to eat or do anything according to his son, agitated & confused Qhs & sun-downing;  Foley in- draining clear urine;  They have home health nurse checking on Pt... We decided to try a low dose SEROQUEL 282m1/2 to 1 Qhs for the sun-downing, agitation, etc & it worked well the first night, son will assess need daily... we reviewed & updated the following medical problems during today's office visit >>     COPD, HxPneumonia> on Advair250Bid & Spiriva daily, plus Mucinex600Bid; denies resp exac- continue same meds, avoid infections...    Cards- HBP, CHF, AFib> Hosp 05/2016 w/ INR=6.7, GIB, & AKI- Coumadin held/ reveresed, off Lasix, on MetopER25; BP= 124/88 & they note that DrFarber doesn't want BP too low due to his atherosclerosis & the endograft; he saw DrNishan 12/2015-- HBP on Aten25, AAA repair[facility] reviewed, no change in meds...    Peripheral Vasc Dis> [facility] w/ CTA chest/abd/pelvis- note reviewed) & they are to contact pt soon regarding future appts...    Hyperlipidemia> on Atorva10; FLP 3/17 showed TChol 120, TG 98, HDL 36, LDL 65... continue same Rx...    Hypothyroid> on Synthroid50; clinically euthyroid but Labs 3/17 showed TSH= 6.98 & pill count was way off, son to control his meds, still on 5057md w/ TSH 05/2016 = 5.31, continue same for now...    GI- HxDivertics, Polyps, hx ischemic bowel, GIB 05/2016  when PT/INR=6.7> see above; CTA Abd&Pelvis 8/16 at [facility] looked ok; he saw DrDBrodie last 2012 & they decided to forgo any f/u colonoscopies; GIB 05/2016 when INR=6.7 Hosp by [facility] & seen by DrSchooler, no interventions- Coumadin held, reveresed, transfused, etc; no abd pain, n/v, had mod stool burden, melena resolved...    GU- prostate cancer, chr cystitis, CKD4> followed by DrWrenn for Urology, abn finding on CTA Abd/Pelvis w/ post bladder  wall thickening, last seen 10/22/15- cysto 06/2015 w/ inflamm in bladder but no mass, cyto neg, he's had hematuria & prev on 13mosuppressive Keflex for chr cystitis;  ADM 05/2016 w/ GIB, INR=6.7, AKI w/ Cr=5.39, mild left hydro- Foley, IVF & Tx, etc & improved to Cr=4.39; followed by Urology & Neph DrPatel/Deterding not a dialysis cand.    DJD> GDeontayeappears to be stable, s/p bilat TKRs, LBP, etc; on Tramadol & Tylenol prn...    Anemia> Labs 3/17 showed Hg= 14.6, MCV= 94; then 05/2016 ADM w/ GIB & INR=6.7, Hg was 8.1 w/ Fe=11 (7%), transfused 2u & needs longer term FeSO4...    Memory Loss, MRI w/ vasc dis, old infarcts, atrophy> we had prev started [name]5=>10 but pt has stopped this med & they do not want to pursue this eval or Rx... EXAM shows Afeb, VSS w/ BP=124/88, O2sat=100% on RA;  HEENT- neg, mallampati1;  Chest- sl decr BS at bases w/ scat rhonchi, no w/r/consolidation;  Heart- irreg AFib, Gr1/6SEM w/o r/g;  Abd- soft, nontender;  Ext- w/o c/c/e;  Neuro- intact w/o focal deficits...   CT Abd&Pelvis 06/17/16>  Lung bases show emphysema & patchy subpleural reticulation, coronary atherosclerosis; no acute intra-abd abn- mild sigm divertics w/o -itis, mod stool burden; Ao atherosclerosis w/ complex thoracoabd aneurysm s/p Ao-bi-iliac stent graft (maxdiameter 7.0 cm w/ celiac/ SMA/ bilat renal art stents in place); nonobstructing renal stone, cysts, bladder wall thickening, hepatic steatosis, L4 compression...   Abd Sonar 06/22/16>  Signs medical renal dis bilat, right renal cyst, foley in bladder, no hydroneph present...  MRI Brain 06/21/16>  No acute ischemia or hemorrhage, mod to severe ventriculomegaly & global parenchymal vol loss, old left cerebellar infarcts, old right thalamic lacunar infarct, attenuated left vertebral & basilar arteries, small vessel dis & ectatic ICAs,   EKG 06/17/16>  Afib, LAD, low voltage limb leads, NSSTTWA...   LABS 05/2016 hosp> reviewed in Epic, pertinent numbers above in  prob list... IMP/PLAN>>  Prob List updated w/ recent GIB, supratherapeutic INR, anemia, AKI, etc as above;  For now has Foley & appt w/ DrWrenn for f/u;  We will recheck labs regarding CBC, renal function, & decision regarding restart of Coumadin for his Afib & vasc dis;  He will also need f/u w/ Nephrology & Cardiology, son indicates too difficult [facility]... we plan short term recheck in 1 week.  ~  July 05, 2016:  1wk ROV recheck & don indicates that GLuciusis sl improved, got a good night's rest w/ Seroquel, although still up & down w/ the Foley cath- removed yest by DrWrenn, using depends, bowel movement wore him out, feels SOB/ weak/ but slowly improving- confusion is better, eating better,etc...  EXAM shows Afeb, VSS w/ BP=126/72, O2sat=98% on RA;  HEENT- neg, mallampati1;  Chest- sl decr BS at bases w/ scat rhonchi, no w/r/consolidation;  Heart- irreg AFib, Gr1/6SEM w/o r/g;  Abd- soft, nontender;  Ext- w/o c/c/e;  Neuro- intact w/o focal deficits...   LABS 06/2016 via office>  Chems- improved w/ BUN=42, Cr=3.27, Alb=3.1;  CBC- improved w/ Hg=10.5.Marland KitchenMarland Kitchen IMP/PLAN>>  Slowly improving w/ foley out, urology following, rec FeSO4 '325mg'$ /d, and we will call coumadin clinic to restart Coumadin but w/ INR target ~2.0;  He has Cards f/u w/ Cherly Hensen at end of the month...  ADDENDUM>>  He saw Cards NP 07/13/16-- post hosp check, noted acute on chr renal failure, consult by DrDeterding, pt declined dialysis & made a DNR; extensive Cards & PV diagnoses, Coumadin recently resumed for his AFib; he had been more SOB w/ incr edema & Lasix was doubled x3d to 80/d + ace wraps for legs; Labs 07/13/16 showed Chems- no ch w/ K=4.3, TCO2=16, BUN=43, Cr=3.39;  CBC- Hg=10.6, MCV=94, PT/INR=1.6 => no changes made... When I was noticed of labs & saw the met acidosis w/ HCO3=16-- I reviewed hosp records and noted that renal had him on BICARB tabs'650mg'$ -2Tid in [place] but not written at disch,  therefore we started HCO3 '650mg'$  tabs 2Bid for now...  ADDENDUM>>  Labs rechecked by Cards 07/19/16-- K=3.1, HCO3=21, BUN=53, Cr=3.54; they rec keep Lasix40/d, add K10/d...   ~  July 19, 2016:  2wk ROV & son Coralyn Mark notes that his edema diminished on the extra Lasix & ACE wrap; he is still quite marginal w/ his advancing renal failure & CHF as the rate-limiting problems; SEE PROB LIST ABOVE...     Underlying COPD on Advair250bid & Spiriva daily, plus Mucinex600Bid; breathing is baseline- mild chest congestion, denies cough/ sputum/ ch in SOB/DOE...    HBP, CHF, AFib, PVD, cerebrovasc dis> on Coumadin '1mg'$  via CC w/ INR goal ~2; last protime 10/26 w/ INR=2.6 & they kept same dose; on MetopER25, Lasix40, K10, NaHCO3-'650mg'$ -2Bid; he has f/u sched w/ Cards...    Medical issues> HL, Hypothy, GI, GU, DJD, Anemia-- on Lip10, Synth50, Seroquel25Qhs, Tramadol50 prn...  EXAM shows Afeb, VSS w/ BP=126/60, O2sat=93% on RA, Wt down to 156#; HEENT- neg, mallampati1;  Chest- sl decr BS at bases w/ scat rhonchi, no w/r/consolidation;  Heart- irreg AFib, Gr1/6SEM w/o r/g;  Abd- soft, nontender;  Ext- w/o c/c/ +tr edema;  Neuro- intact w/o focal deficits, weak, lethargy...   LABS 07/19/16>  K=3.4, HCO3=26, BUN=68, Cr=3.73 and rec to continue same meds, incr fluid intake, maintain f/u coumadin clinic & ROV recheck 2wks...  IMP/PLAN>>  Ryan Hess is going downhill slowly; Hospice is on-board, supportive care/ comfort Rx; we will recheck in 2 wks...  ~  August 09, 2016:  3wk ROV & Ryan Hess has had good days (approx 4/7) and bad (approx3/7);  Hospice is on board & he is getting personal care 4d/wk & nursing visits- son indicates that this is very helpful; he also has a hosp bed & all DME needs are met... SOB is unchanged, he notes mild cough, sm amt clear sput, no CP/ tightness/ wheezing/ f/c/s/ etc...    Underlying COPD on Advair250bid & Spiriva daily, plus Mucinex600Bid; breathing is baseline- mild chest congestion, not much  cough/ sputum/ ch in SOB/DOE...    HBP, CHF, AFib, PVD, cerebrovasc dis> on Coumadin '1mg'$  via CC w/ INR goal ~2; on MetopER25, Lasix40, K10, NaHCO3-'650mg'$ -2Bid; he has f/u sched w/ Cards...    Medical issues> HL, Hypothy, GI, GU- renal failure w/ Cr!3.5, DJD, Anemia-- on Lip10, Synth50, Seroquel25Qhs, Tramadol50 prn...  EXAM shows Afeb, VSS w/ BP=126/60, O2sat=100% on RA, Wt down to 152#; HEENT- neg, mallampati1;  Chest- sl decr BS at bases w/ scat rhonchi, no w/r/consolidation;  Heart- irreg AFib, Gr1/6SEM w/o r/g;  Abd-  soft, nontender;  Ext- w/o c/c/ +tr edema;  Neuro- intact w/o focal deficits, weak, lethargy...   LABS 08/09/16>  Stable w/ HCO3=24, K=4.0, Cr=3.6 IMP/PLAN>>  Stable on current meds, continue same & ROV to keep close tabs on his labs in ~6wks...   ~  September 13, 2016:  53moROV & here to recheck labs> GJazzielreports stable, son indicates that he is slowly going downhill, doesn't eat or drink much, won't get OOB, more difficulty getting around at home; Hospice is on board & we want them to concentrate on his comfort... See above problem list...    EXAM shows weaker but Afeb, VSS w/ BP=118/60, O2sat=97% on RA, Wt down to 152#; HEENT- neg, mallampati1;  Chest- sl decr BS at bases w/ scat rhonchi, no w/r/consolidation;  Heart- irreg AFib, Gr1/6SEM w/o r/g;  Abd- soft, nontender;  Ext- w/o c/c/ +tr edema;  Neuro- intact w/o focal deficits, weak, lethargy...   LABS 09/13/16>  Chems- K=3.8, HCO3=19 (down from 24), BUN=56, Cr=4.07;  CBC- Hg=11.8, WBC=8.3 IMP/PLAN>>  He last saw DrWrenn 06/2016 & decided to forgo further f/u visits; [facility] & rec Hospice care, signed off; Discussed w/ pt 7 son-- rec to incr Bicarb to 5 tabs /d, incr fluid intake, continue hospice comfort care...            Problem List:  COPD (ICD-496) Hx of PNEUMONIA, ORGANISM UNSPECIFIED (ICD-486) PULMONARY NODULE & CAVITY RLL (ICD-518.89) - he is an  ex-smoker, having quit in 1[place]after 40 yrs of smoking... he was exercising regularly and walking daily ~137m5-6 days per week... on ADCarrolltonaily, +MUCINEX 2Bid w/ Fluids... ~  baseline CXR & CTChest w/ marked emphysema, atheromatous calcif, 5cm desc thorAA...  ~  CXR & CTA 2/11 in hosp showed cardiomeg + coronary calcif, advanced atherosclerosis of Ao w/ aneuryms 5.8cm into abd, underlying emphysema w/ interst edema & bilat effusions, no PE... ~  4/11:  RLL pneumonia which was slow to clear... ~  6/11:  f/u CXR & CT Chest 6/11 w/ 6cm cavitary area RLL & nodular component inferiorly, +hilar adenopathy, no mediastinal nodes, etc; Tumor markers showed CEA=7.5 & Ca19-9= 10.8; > IR felt lesion was too hi risk for bx, therefore contin Rx & observe. ~  8/11:  f/u CXR w/o change in RLL opac (no worsening)>> repeat CEA=6.5; plan f/u CT Chest in 20m28mo  10/11: f/u CT Chest showed interval decrease in size of RLL cavitary lesion & the inferiorly placed soft tissue component; otherw there is severe emphysema, cardiomeg, coronary calcif, thoracoabd aneurysm w/o change; there was an air-fluid level in a dilated esoph> check Ba Esophagram= Nonspecific esophageal motility disorder with very poor primary esophageal contractions (no HH, stricture, etc)... ~  12/12: f/u CTChest showed severe emphysema & scarring w/ bibasilar atx (no infiltrates no cavities), markedly tortuous & ectaticThorAo w/ extensive atherosclerotic changes & no change in decr thor aneurysm measuring 5.9 x 5.6cm, mild cardiomeg & dense coronary calcif as well... ~  4/13:  He had a fenestrated endovasc repair of Thor[facility]abdominal AA [facility] ~  subseq CXRs thru 7/13 hosp w/ cardiomeg, low lung vols, bibasilar airsp dis, etc... ~  CXR 1/14 showed cardiomeg, bibasilar fibrosis, thoracic aortic stent noted, NAD... [name]  7/14:  ?RUL pneumonia treated w/ Avelox & resolved=> pt had his planned [facility]  10/14 showed chr changes of  Ao stent graft, stable cardiomegaly, no infiltrates, NAD.[name]. ~  5/15: on Advair250, Spiriva, Mucinex; last CT 12/12 w/ severe emphysema & scarring, plus his extensive atherosclerotic dis/ ectatic thorAo/ aneurysm/ etc; his breathing is stable- mild cough, min sput, no hemoptysis, stable DOE, etc. ~  11/15: stable on same meds... ~  CXR 1/16 at [facility] showed stable thoracoabdominal aortic endograft- no change, stable heart size, COPD/Emphysema & mild right basilar atelectasis, NAD.Marland KitchenMarland Kitchen ~  CT Chest (along w/ Abd&Pelvis) 1/16 at [facility] showed stable to sl decr in size of the mid thoracic Ao aneurysm, stable appearance of the 4-vessel fenestrated thoracoabdominal endograft... ~  3/16: on Advair250Bid & Spiriva daily, plus Mucinex600Bid; denies resp exac; he had thorough eval at [facility] 1/16- stable & no changes made...    HYPERTENSION (ICD-401.9)                         << followed by Cherly Hensen for Cards >> CONGESTIVE HEART FAILURE (ICD-428.0) ATRIAL FIBRILLATION (ICD-427.31) w/ clot in left atrial appendage >> resolved on f/u TEE, on COUMADIN per Cards & cardioverted. PREMATURE VENTRICULAR CONTRACTIONS, FREQUENT (ICD-427.69)  MEDS> prev on ATENOLOL '25mg'$ /d, & LASIX '20mg'$ -2AM + KCl 61mqBid, and off Losartan... prev Norvasc discontinued & hx ACE cough in the past on Lisinopril. ~  NuclearStressTest 1/05 was neg- no ischemia or infarct (+diaphrag attenuation), EF=56%... ~  repeat Nuclear study 6/09 was neg- no ischemia, mild inferoapic thinning, not gated due to PVCs... ~  2DEcho 6/09 showed mild dilated LV w/ EF= 45-50% but no regional wall motion abn, mild AoV calcif & AI, LA mild dil... ~  12/10:  changed from [place] to [place] save $$ ~  2/11:  in hosp- Cath= mild nonobstructive 3 vessel CAD, mod LVD w/ EF=40-45%; TEE= CHF w/ EF~45% w/ HK & left atrial appendage clot & AFib; he was diuresed & Norvasc stopped, placed on Coumadin w/ careful f/u by DCherly Hensen& the CC... ~  [facility] w/ PVCs... ~  Recurrent AFib w/ RVR after his Thoracoabdominal stent graft 4/13... ~  6/13:  BP was low assoc w/ GU bleeding & ATENOLOL25 & LOSARTAN50 placed on HOLD... ~  COUMADIN on HOLD since GU bleeding & acute blood loss anemia; it has not yet been restarted... ~  8/13:  BP= 116/78 on LASIX '40mg'$ /d & K10-Bid;  Wt down 14# to 173# ~  10/13:  BP= 114/82 on Lasix40+K1-Bid (he takes Aten25 if BP>110 at home)... ~  12/13: he had Cards f/u DrNishan> note reviewed, no change in meds, he referred him to DrGearhart for f/u stent [facility] ~  5/14: HBP, CHF, AFib w/ hx clot in LA appendage- resolved on Coumadin> on ASA81, Aten25, Lasix40, K10Bid; BP= 114/72 & he denies CP, palpit etc; followed by DCherly Hensenfor Cards- his notes are reviewed, stable- no changes made. ~  8/14: [facility] DrFarber... ~  5/15: on ASA81, Coumadin, Aten25 (if BP>110), Losartan100, Lasix40; BP= 120/64 & he denies CP, palpit etc; followed by DCherly Hensenfor Cards- his notes are reviewed, stable- BNP 5/15= 184... ~  7/15: on ASA81, Coumadin, Aten25 (if BP>110), Losartan100, Lasix40; BP= 110/60 & he presented w/ incr left leg swelling after fall; Renal function sl worse w/ Cr=2.0 & rec to decr Losartan100=>50 ~  11/15: on ASA81, Coumadin, Losar50, Lasix40; BP= 130/72 & he denies CP, palpit  etc; followed by Cherly Hensen for Cards- last seen 12/14, stable- no changes made & he is due for f/u visit...Marland KitchenMarland KitchenMarland Kitchen  PERIPHERAL VASCULAR DISEASE (ICD-443.9) - back on ASA '81mg'$ /d...  ~  he is s/p infrarenal AAA repair w/ right common iliac aneurysm repair (via Ao Bi-iliac graft) in 1998 by DrLawson; also has a known desc thor AA measuring ~5+cm and followed by DrGearhart...  ~  seen by DrGearhart 4/10 w/ CT scan showing ~5cm thoracoabdominal aneurysm w/o signif change...  ~  seen by DrGearhart 2/11 in hosp w/ sl incr size of AAA- f/u planned 64mo ~   also followed by [facility]- DrCooper. ~  seen by DrGearhardt 10/11 & stable, no change, f/u 1 yr. ~  Seen 12/12 by DrGearhardt & CTscan is stable, continue conservative approach... ~  2/13:  [facility]... ~  4/13:  He had a fenestrated endovasc [facility]dominal [facility] complic by lumbar epidural hematomas (on Coumadin) w/ paraplegia requiring readmission for laminectomy & evac of the hematomas;  [facility] and then [facility] but only spent 4d there before [facility] w/ altered mental status, pneumonia, AFib w/ rvr, & FTT> Neuro w/u was otherw neg (& he improved w/ supportive care)... ~  2/14:  CT Chest, Abdomen, Pelvis> per DrBrabham - see report (the abdominal component has enlarged)... 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 DrFarbers note indicates that to finish the repair he will require 3 branches and a left renal fenestration, they quoted him a risk of paraplegia in the 10-15% range & he will need preadmission for a lumbar drain to be placed, etc; [facility] ~  [facility]14[facility]/ 5 stents placed 04/15/13 in a >4h operation w/ special spinal precautions- he developed a blood clot in his left arm w/ some persist swelling; [facility] ~  [facility]/ X[facility], CT scans, dopplers, etc on a regular basis- results reviewed in Care Everywhere section of Epic...  VENOUS INSUFFICIENCY, EDEMA >>  ~  He has chronic venous insuffic w/ mild chronic edema maintained on a  low sodium diet, elevation, support hose when nec, & LASIX'40mg'$ /d... ~  7/15:  Presented 2wks after fall at home, left leg red/ swollen/ sl tender, no broken skin/ drainage/ not hot etc; VenDopplers showed NEG for DVT & Protime was too thin... ~  11/15: edema resolved and back to baseline...  HYPERCHOLESTEROLEMIA (ICD-272.0) - on LIPITOR '10mg'$ /d...  ~  [place] TChol 115, Tg 58, HDL 36, LDL 67 ~  FLP 5/08 showed TChol 118, TG 66, HDL 31, LDL 74 ~  FLP 5/09 showed TChol 126, TG 71, HDL 37, LDL 75 ~  FLP 6/10 showed TChol 116, TG 73, HDL 40, LDL 62 ~  2/11:  FLP not checked during the hospitalization... ~  FLP 4/11 in hosp showed TChol 94, TG 52, HDL 23, LDL 61 ~  FLP 12/12 on Lip10  showed TChol 107, TG 38, HDL 40, LDL 60 ~  FLP 3/16 on Lip10 showed TChol 133, TG 82, HDL 43, LDL 74  HYPOTHYROIDISM >> he remains clinically euthyroid... ~  Labs 12/12 showed TSH= 6.10 ~  Labs 6/13 showed TSH= 6.66 ~  Labs 5/14 showed TSH= 6.21... We decided to start SYNTHROID 56mg/d... ~  Labs 5/15 on Levothy50 showed TSH= 1.92 ~  Labs 3/16 on Synthroid50 showed TSH= 2.82  DIVERTICULOSIS OF COLON (ICD-562.10) & COLONIC POLYPS (ICD-211.3) - hx polyps in 2003 = tubular adenoma... ~  last colonoscopy 9/06 showed divertics, hems, no polyps. ~  Pt followed by DrDBrodie & they decided to forgo any further GI procedures/ colonoscopies...  ISCHEMIC BOWEL >> GDerrallpresented to the ER 7/23 - 04/25/12 w/ abd pain- ELap surg revealed ~5 feet of frankly ischemic small bowel, likely due to internal hernia and closed loop obstruction=> Bowel resected & pulses intact w/ no evidence of embolic or thrombotic pathology;  Viable small bowel anastomosed & wound vac applied; he had a stable post op course...  RENAL INSUFFICIENCY >>  PROSTATE CANCER (ICD-185) - eval by DrWrenn and they decided on XRT by DrKinard- finished 5/09 and he states that DrKinard "released me"... he sees DrWrenn every 6 months & they follow PSA closely  (notes reviewed)... ~  8/12: f/u prostate cancer dx in 2009 & treated w/ XRT completed 6/09; slowly rising PSA w/ doubling time [id number] voiding symptoms, he is considering androgen ablation if needed... ~  6/13: He developed hematuria & urinary retention requiring Urology eval, discontinuation of Coumadin, & Foley placement=> subseq removed 8/13 w/ adeq voiding trial... ~  3/14:  He had f/u visit w/ DrWrenn- PSA nadir after XRT was <0.04 and it has climbed to 0.54 (3/14) w/ PSADT of ~1y37yrhey chose to continue watchful waiting... ~  1/15:  DrWrenn reports that PSA is up to 1.46 ~  Labs 5/15 on Lasix40 showed Cr=1.6, BNP=184 ~  7/15: he had f/u w/ DrWrenn> BPH w/ BOO, Prostate ca; on Flomax0.4, PSA up to 1.84, neg bone scan x degen changes; they continue to follow w/ watchful waiting, ROV in 101mo24mo~  He maintains regular f/u w/ drWrenn> we do not have recent clinic notes but pt indicates that everthing is ok... ~  3/16: [facility] & reviewed in CareMorrisonicates thickening & trabeculation of the bladder wall & prox ascending bowel wall thickening... NOTE> they rec f/u w/ Urology & records sent in light of these findings... ~  Labs 3/16 showed BUN=32, Cr= 1.85...  DEGENERATIVE JOINT DISEASE (ICD-715.90) - s/p bilat TKR's, [facility]- DrOlin... He has OXY-IR '5mg'$  as needed for pain...  MEMORY LOSS, SENILE DEMENTIA, Vasc disease/ infarcts on MRI/ global atrophy >> he tried [name] in the past & they stopped it... 05/2016> he's been sundowning & we prescribed Seroquel '25mg'$ - 1/2 to 1 tab as needed...   ACTINIC SKIN DAMAGE (ICD-692.70) - followed by DrHoBrunetta Jeans knows to avoid sun exposure, use sun screen, etc...  ANEMIA >> ~  Labs 6/13 showed Hg= 8.4, Fe= 31 (11%sat); Rec to start FeSO4 Bid... ~  Labs 7/13 in hosp showed Hg= 12.6=>8.2 at disch... ~  Labs 8/13 showed Hg= 9.5, MCV= 94; rec to continue FeSO4 Bid... ~  Labs 10/13 showed Hg= 13.2 & ok to wean Fe to 1/d til  gone then stop... ~  Labs 5/14 showed Hg= 14.2 ~  Labs 5/15 showed Hg= 13.2 ~  Labs 3/16  showed Hg= 14.4   Past Surgical History:  Procedure Laterality Date  . ABDOMINAL AORTIC ANEURYSM REPAIR  1998  . APPENDECTOMY    . APPLICATION OF WOUND VAC  04/18/2012   Procedure: APPLICATION OF WOUND VAC;  Surgeon: Joyice Faster. Cornett, MD;  Location: [place];  Service: General;  Laterality: N/A;  Removal of abdominal wound vac, Application of incisional  wound vac  . INGUINAL HERNIA REPAIR     left  . JOINT REPLACEMENT    . LAPAROTOMY  04/16/2012   Procedure: EXPLORATORY LAPAROTOMY;  Surgeon: Madilyn Hook, DO;  Location: [place];  Service: General;  Laterality: N/A;  exploratory Laparotomy,Small bowel ressection abdominal wound vac placement.  [name] LAPAROTOMY  04/18/2012   Procedure: EXPLORATORY LAPAROTOMY;  Surgeon: Joyice Faster. Cornett, MD;  Location: [place];  Service: General;  Laterality: N/A;  exploratory laparotomy with small bowel anastomosis  . repair of AA  04/14/2013   done at [place]  . TOTAL KNEE ARTHROPLASTY     bilateral    Outpatient Encounter Prescriptions as of 09/13/2016  Medication Sig Dispense Refill  . ferrous sulfate 325 (65 FE) MG EC tablet Take 325 mg by mouth daily with breakfast.    . Fluticasone-Salmeterol (ADVAIR) 250-50 MCG/DOSE AEPB Inhale 1 puff into the lungs 2 (two) times daily.    . furosemide (LASIX) 40 MG tablet Take 40 mg by mouth daily.    [name] guaiFENesin (MUCINEX) 600 MG 12 hr tablet Take 600 mg by mouth 2 (two) times daily. COPD.    [name] levothyroxine (SYNTHROID, LEVOTHROID) 50 MCG tablet TAKE 1 TABLET BY MOUTH DAILY 90 tablet 0  . metoprolol succinate (TOPROL XL) 25 MG 24 hr tablet Take 1 tablet (25 mg total) by mouth daily. 90 tablet 3  . Multiple Vitamin (MULTIVITAMIN) tablet Take 1 tablet by mouth daily.      [name] neomycin-polymyxin b-dexamethasone (MAXITROL) 3.5-10000-0.1 OINT Place 1 application into both eyes as needed (for eye irriation or itching).    . potassium  chloride (K-DUR) 10 MEQ tablet Take 2 tablets by mouth on day 1 then 1 tablet by mouth daily thereafter 91 tablet 1  . QUEtiapine (SEROQUEL) 25 MG tablet Take 1 tablet (25 mg total) by mouth at bedtime. 30 tablet 5  . sodium bicarbonate 650 MG tablet Take 2 tablets by mouth two times daily 120 tablet 5  . tiotropium (SPIRIVA) 18 MCG inhalation capsule Place 18 mcg into inhaler and inhale daily.    . traMADol (ULTRAM) 50 MG tablet Take 1 tablet (50 mg total) by mouth 3 (three) times daily as needed. for pain 90 tablet 0  . warfarin (COUMADIN) 1 MG tablet Take 1 tablet (1 mg total) by mouth as directed. 30 tablet 2   No facility-administered encounter medications on file as of 09/13/2016.     No Known Allergies   Current Medications, Allergies, Past Medical History, Past Surgical History, Family History, and Social History were reviewed in [employer] record.   Review of Systems        See HPI - all other systems neg except as noted...  The patient complains of dyspnea on exertion.  The patient denies anorexia, fever, weight loss, weight gain, vision loss, decreased hearing, hoarseness, chest pain, syncope, peripheral edema, prolonged cough, headaches, hemoptysis, abdominal pain, melena, hematochezia, severe indigestion/heartburn, hematuria, incontinence, muscle weakness, suspicious skin lesions, transient blindness, difficulty walking, depression, unusual weight change, abnormal bleeding, enlarged lymph nodes, and angioedema.     Objective:  Physical Exam    WD, WN, 82 y/o WM in NAD... GENERAL:  Alert & oriented; pleasant & cooperative; pale complexion... HEENT:  [place]/AT, EOM-wnl, PERRLA, EACs-clear, TMs-wnl, NOSE-clear, THROAT-clear & wnl. NECK:  Supple w/ fairROM; no JVD; normal carotid impulses w/o bruits; no thyromegaly or nodules palpated; no lymphadenopathy. CHEST:  decr BS bilat, scat bibasilar rales, w/o wheezing/ rhonchi/ signs of consolidation. HEART:   irregular rhythm, gr1/6 diast murmur in Ao area, without rubs or gallops detected... ABDOMEN:  Soft & nontender; normal bowel sounds; no organomegaly or masses palpated... EXT: s/p bilat TKR's, mod arthritic changes; +venous insuffic, left leg swelling/ red > right leg, no drainage NEURO:  CN's intact;  gait abn; no focal neuro deficits... DERM:  No lesions noted; no rash etc...  RADIOLOGY DATA:  Reviewed in the EPIC EMR & discussed w/ the patient...  LABORATORY DATA:  Reviewed in the EPIC EMR & discussed w/ the patient...   Assessment:      COPD/Emphysema, Hx Pneumonia>  baseline severe COPD/Emphysema w/ Cardiomeg, Aneurysm & bibasilar fibrosis, stable on Advair250, Spiriva, Mucinex...  HBP>  HBP, CHF, AFib> Coumadin on HOLD after GIB, off ASA81, off Losar25, on MetopER25, & Lasix40 on HOLD; BP= 124/88 range & they note that DrFarber doesn't want it too low due to his atherosclerosis & the endograft; he [facility] saw [facility] 12/2015... 07/19/16>  Continue Lasix40, K10, Bicarb tabs-- Hospice is on board as he is a DNR & refused dialysis; recheck pt in 2 wks  CAD/ CHF/ etc>  Followed by Cherly Hensen & his prev notes are reviewed;  BNP 12/14 was up to 4200 & on Lasix40 it has improved to 184 by 5/15; 3/17 it measures 462 & reminded low sodium...Marland KitchenMarland KitchenMarland Kitchen  AFib>  On above + Coumadin followed in the CC but Protime became supratherapeutic 05/2016 causing GIB, AKI, etc; Coumadin on hold now... 07/05/16>  We discussed restart Coumadin w/ reduced INR goal ~2.0 07/20/16>  Followed by CC & INR=2.6, they are adjusting & following...  Periph Vasc Dis>  S/p AAA repair 1998 by Sheryn Bison; then followed by DrGearhardt for thoraco-abd aneurysm & referred to [facility] for stent graft surg- done 4/13 by DrFarber w/ complications, and 2nd stage of the surgery 7/14 w/ 5 stents placed per DrFarber... They continue to follow regularly w/ CT scans every 31mo..  Ven Insuffic & Edema>  Prev improved w/ Lasix40/d;  He knows to elim salt,  elev legs, wear support hose, etc;  He fell 7/15 w/ bruise reported, then followed by redness in skin & incr left leg swelling=> VenDoppler is neg for DVT & Protime was too thin=> adjusted; swelling resolved over time & back to baseline...  CHOL>  FLP looks good on Lip10...  Subclinical Hypothyroid>  TSH was 6.21 and Synthroid50 started 5/14=> TSH improved to 2.82...  GI> Presbyesoph, Divertics, Hx polpyps>  Stable & followed by DrDBrodie... 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 05/2016>  ADM w/ GIB due to INR=6.7; seen by DrSchooler, no intervention; treated w/ PPI but not disch on one......  Hx Prostate Cancer & Stage 4 Renal Failure>  Followed by DrWrenn  w/ slowly rising PSA as noted; then developed urinary retention after lumbar surg and hematuria after foley & coumadin; now back to baseline & being followed... 9/16> there is [facility] given to pt to take to f/u appt w/ DrWrenn..Marland KitchenMarland Kitchen  9/17> he is followed by DrWrenn- hx prostate Ca w/ prev XRT 2009 & PSA recurrence (PSA=2.8 [id number] from nadir <0.04); Hosp w/ GIB, had UTI w/ Klebs, urinary retention, & foley placed, outpt management by Urology... 10/17> Nishanth was [facility] 9/23-10/2/17 w/ AKI & Cr=5.39, improved to 3.27 & slowly rising since then; Nephrology indicated not a dialysis candidate, hospice care, comfort measures...  DJD>  Followed by DrOlin, s/p bilat TKRs...  ANEMIA>  He developed acute blood loss anemia w/ post op Hg= 11-12 but dropped to 8.6 w/ hematuria & INR=4.2; Coumadin & ASA placed on HOLD; f/u labs showed Hg=8.4 & Fe=31 & started on FeSO4 Bid;  Then Hg=9.5 & rec to continue Fe Bid;  Cards restarted his Coumadin;  Now Hg= 14.4  05/2016> he was HOSP by [facility] w/ GIB/melena, INR=6.7, Hg=8.1, low Fe- given 2u Tx & rec for OTC Fe 1/d...  Actinic skin changes>  Aware, he is monitored by  Derm...     Plan:     Patient's Medications  New Prescriptions   No medications on file  Previous Medications   FERROUS SULFATE 325 (65 FE) MG EC TABLET    Take 325 mg by mouth daily with breakfast.   FLUTICASONE-SALMETEROL (ADVAIR) 250-50 MCG/DOSE AEPB    Inhale 1 puff into the lungs 2 (two) times daily.   FUROSEMIDE (LASIX) 40 MG TABLET    Take 40 mg by mouth daily.   GUAIFENESIN (MUCINEX) 600 MG 12 HR TABLET    Take 600 mg by mouth 2 (two) times daily. COPD.   LEVOTHYROXINE (SYNTHROID, LEVOTHROID) 50 MCG TABLET    TAKE 1 TABLET BY MOUTH DAILY   METOPROLOL SUCCINATE (TOPROL XL) 25 MG 24 HR TABLET    Take 1 tablet (25 mg total) by mouth daily.   MULTIPLE VITAMIN (MULTIVITAMIN) TABLET    Take 1 tablet by mouth daily.     NEOMYCIN-POLYMYXIN B-DEXAMETHASONE (MAXITROL) 3.5-10000-0.1 OINT    Place 1 application into both eyes as needed (for eye irriation or itching).   POTASSIUM CHLORIDE (K-DUR) 10 MEQ TABLET    Take 2 tablets by mouth on day 1 then 1 tablet by mouth daily thereafter   QUETIAPINE (SEROQUEL) 25 MG TABLET    Take 1 tablet (25 mg total) by mouth at bedtime.   SODIUM BICARBONATE 650 MG TABLET    Take 2 tablets by mouth two times daily   TIOTROPIUM (SPIRIVA) 18 MCG INHALATION CAPSULE    Place 18 mcg into inhaler and inhale daily.   TRAMADOL (ULTRAM) 50 MG TABLET    Take 1 tablet (50 mg total) by mouth 3 (three) times daily as needed. for pain   WARFARIN (COUMADIN) 1 MG TABLET    Take 1 tablet (1 mg total) by mouth as directed.  Modified Medications   No medications on file  Discontinued Medications   No medications on file

## 2016-09-15 ENCOUNTER — Ambulatory Visit (INDEPENDENT_AMBULATORY_CARE_PROVIDER_SITE_OTHER): Payer: Medicare Other | Admitting: Interventional Cardiology

## 2016-09-15 DIAGNOSIS — I4819 Other persistent atrial fibrillation: Secondary | ICD-10-CM

## 2016-09-15 DIAGNOSIS — I481 Persistent atrial fibrillation: Secondary | ICD-10-CM

## 2016-09-15 LAB — POCT INR: INR: 1.8

## 2016-09-21 ENCOUNTER — Telehealth: Payer: Self-pay | Admitting: Pulmonary Disease

## 2016-09-21 MED ORDER — LEVOFLOXACIN 500 MG PO TABS: 500.0000 mg | ORAL_TABLET | Freq: Every day | ORAL | 0 refills | Status: DC

## 2016-09-21 NOTE — Telephone Encounter (Signed)
Per SN---  Pt should definitely take his inhalers daily---katie said that he has been using here and there---she stated that he has been refusing some of his oral meds daily but has been taking the coumdin regularly.   We added mucinex, but Joellen Jersey stated that the pt already had an order for the mucinex.  We will call in levaquin 500 mg  1 daily to his pharmacy and Joellen Jersey is aware. Nothing further is needed.

## 2016-09-21 NOTE — Telephone Encounter (Signed)
Katie from hospice stated that the pt is having tightness, pressure in his chest from the cough and the thick mucus.  Lungs sounds are clear but is having SOB when walking from the bedroom to the bathroom. Cough during the day but cough is much worse at night with yellow/brown red tinged phlegm.  Pt seems to be weaker and has been in the bed for the last couple of days.  Only liquid intake today---wanted to keep SN updated.  Please advise if anything further needs to be done.  Thanks  No Known Allergies

## 2016-09-21 NOTE — Telephone Encounter (Signed)
Katie [phone]) wanted to leave a update:  patient is having complaints of thickness,tightness, pressure in chest. lungs sound clear interior and posterior, but his shortness when walking from bedrm to bathrm.Marland Kitchenoccasional cough during day, constant at night with yellowish-brown red tinted phlegm, patient has becoming weaker, and been in bed for the last 2 days. Temp: 98.6, Resp: 26, BP 120/50, Pulse 100..had not taken no oral medications or inhalers (advair and spiriva) only intake liquid today, had bites of food yesterday.Mearl Latin

## 2016-09-22 ENCOUNTER — Other Ambulatory Visit: Payer: Self-pay | Admitting: Internal Medicine

## 2016-09-22 ENCOUNTER — Ambulatory Visit (INDEPENDENT_AMBULATORY_CARE_PROVIDER_SITE_OTHER): Payer: Medicare Other | Admitting: Cardiology

## 2016-09-22 DIAGNOSIS — I4819 Other persistent atrial fibrillation: Secondary | ICD-10-CM

## 2016-09-22 DIAGNOSIS — I481 Persistent atrial fibrillation: Secondary | ICD-10-CM

## 2016-09-22 LAB — POCT INR: INR: 2.1

## 2016-09-22 NOTE — Telephone Encounter (Deleted)
CY please advise on refill. Thanks. 

## 2016-10-03 ENCOUNTER — Other Ambulatory Visit: Payer: Self-pay | Admitting: Cardiovascular Disease

## 2016-10-06 ENCOUNTER — Ambulatory Visit (INDEPENDENT_AMBULATORY_CARE_PROVIDER_SITE_OTHER): Payer: Medicare Other | Admitting: Cardiology

## 2016-10-06 DIAGNOSIS — I481 Persistent atrial fibrillation: Secondary | ICD-10-CM

## 2016-10-06 DIAGNOSIS — I4819 Other persistent atrial fibrillation: Secondary | ICD-10-CM

## 2016-10-06 LAB — POCT INR: INR: 2.8

## 2016-10-20 ENCOUNTER — Ambulatory Visit (INDEPENDENT_AMBULATORY_CARE_PROVIDER_SITE_OTHER): Payer: Medicare Other | Admitting: Internal Medicine

## 2016-10-20 DIAGNOSIS — I481 Persistent atrial fibrillation: Secondary | ICD-10-CM

## 2016-10-20 DIAGNOSIS — I4819 Other persistent atrial fibrillation: Secondary | ICD-10-CM

## 2016-10-20 LAB — POCT INR: INR: 1.6

## 2016-10-27 ENCOUNTER — Ambulatory Visit (INDEPENDENT_AMBULATORY_CARE_PROVIDER_SITE_OTHER): Payer: Medicare Other | Admitting: Internal Medicine

## 2016-10-27 DIAGNOSIS — I4819 Other persistent atrial fibrillation: Secondary | ICD-10-CM

## 2016-10-27 DIAGNOSIS — I481 Persistent atrial fibrillation: Secondary | ICD-10-CM

## 2016-10-27 LAB — POCT INR: INR: 2

## 2016-10-30 ENCOUNTER — Other Ambulatory Visit: Payer: Self-pay | Admitting: Cardiovascular Disease

## 2016-11-03 ENCOUNTER — Ambulatory Visit (INDEPENDENT_AMBULATORY_CARE_PROVIDER_SITE_OTHER): Payer: Medicare Other | Admitting: Internal Medicine

## 2016-11-03 DIAGNOSIS — I4819 Other persistent atrial fibrillation: Secondary | ICD-10-CM

## 2016-11-03 DIAGNOSIS — I481 Persistent atrial fibrillation: Secondary | ICD-10-CM

## 2016-11-03 LAB — POCT INR: INR: 2

## 2016-11-15 ENCOUNTER — Telehealth: Payer: Self-pay | Admitting: Pulmonary Disease

## 2016-11-15 ENCOUNTER — Ambulatory Visit: Payer: Medicare Other | Admitting: Pulmonary Disease

## 2016-11-15 NOTE — Telephone Encounter (Signed)
Forms have been received. SN is not here this AM yet. I have called and let Shavonne know that we have received these orders and we will have SN sign ASAP.

## 2016-11-16 ENCOUNTER — Ambulatory Visit: Payer: Medicare Other | Admitting: Cardiovascular Disease

## 2016-11-16 NOTE — Telephone Encounter (Signed)
Orders have been faxed back.

## 2016-11-17 ENCOUNTER — Ambulatory Visit (INDEPENDENT_AMBULATORY_CARE_PROVIDER_SITE_OTHER): Payer: Medicare Other | Admitting: Internal Medicine

## 2016-11-17 DIAGNOSIS — I481 Persistent atrial fibrillation: Secondary | ICD-10-CM

## 2016-11-17 DIAGNOSIS — I4819 Other persistent atrial fibrillation: Secondary | ICD-10-CM

## 2016-11-17 LAB — POCT INR: INR: 2.4

## 2016-12-01 ENCOUNTER — Ambulatory Visit (INDEPENDENT_AMBULATORY_CARE_PROVIDER_SITE_OTHER): Payer: Medicare Other | Admitting: Internal Medicine

## 2016-12-01 DIAGNOSIS — I481 Persistent atrial fibrillation: Secondary | ICD-10-CM

## 2016-12-01 DIAGNOSIS — I4819 Other persistent atrial fibrillation: Secondary | ICD-10-CM

## 2016-12-01 LAB — POCT INR: INR: 3.1

## 2016-12-15 ENCOUNTER — Telehealth: Payer: Self-pay | Admitting: Pulmonary Disease

## 2016-12-15 ENCOUNTER — Ambulatory Visit (INDEPENDENT_AMBULATORY_CARE_PROVIDER_SITE_OTHER): Payer: Medicare Other | Admitting: Pharmacist

## 2016-12-15 DIAGNOSIS — I4819 Other persistent atrial fibrillation: Secondary | ICD-10-CM

## 2016-12-15 DIAGNOSIS — I481 Persistent atrial fibrillation: Secondary | ICD-10-CM

## 2016-12-15 LAB — POCT INR: INR: 3.8

## 2016-12-15 NOTE — Telephone Encounter (Signed)
Per SN-  Symptom mgmt and comfort care are recommended.  Spoke with [name] at [facility], aware of recs.  Nothing further needed at this time.

## 2016-12-15 NOTE — Telephone Encounter (Signed)
Spoke with [name] at [facility], giving a status update on pt- pt is having to be coaxed to eat, drink, or take meds.  Eats maybe 1 meal daily.  Pt is in bed over 12 hours daily, but is relatively alert and responsive.  Pt does have a prod cough with yellow-tan sputum.    BP 92/40 this morrning, edema 2-3+ in R wrist, hand, L lower extremity edema 1-2.   Pt is on taking lasix and hospice docs do not want to push more lasix to help with swelling d/t such low BP.   [name] is wanting further recs- should they proceed with symptom mgmt as the focus, or comfort care?    SN please advise.  Thanks!

## 2016-12-29 ENCOUNTER — Ambulatory Visit (INDEPENDENT_AMBULATORY_CARE_PROVIDER_SITE_OTHER): Payer: Medicare Other

## 2016-12-29 DIAGNOSIS — I4819 Other persistent atrial fibrillation: Secondary | ICD-10-CM

## 2016-12-29 DIAGNOSIS — I481 Persistent atrial fibrillation: Secondary | ICD-10-CM

## 2016-12-29 LAB — POCT INR: INR: 3.2

## 2017-01-12 ENCOUNTER — Ambulatory Visit (INDEPENDENT_AMBULATORY_CARE_PROVIDER_SITE_OTHER): Payer: Medicare Other | Admitting: Cardiovascular Disease

## 2017-01-12 DIAGNOSIS — I481 Persistent atrial fibrillation: Secondary | ICD-10-CM

## 2017-01-12 DIAGNOSIS — I4819 Other persistent atrial fibrillation: Secondary | ICD-10-CM

## 2017-02-01 ENCOUNTER — Telehealth: Payer: Self-pay | Admitting: Pulmonary Disease

## 2017-02-01 MED ORDER — LEVOFLOXACIN 500 MG PO TABS: 500.0000 mg | ORAL_TABLET | Freq: Every day | ORAL | 0 refills | Status: AC

## 2017-02-01 NOTE — Telephone Encounter (Signed)
SN  Please Advise-  [name] from Hospice called and stated pt is c/o congestion,coughing up tanish colored sputum, Denies him having increase sob,wheezing,fever. She states pt is no longer able to take mucinex due to not being able to swallow it. She did mention his breathing is shallow. Son wanted to know if an antibiotic could be called in

## 2017-02-01 NOTE — Telephone Encounter (Signed)
lmomtcb x1 

## 2017-02-01 NOTE — Telephone Encounter (Signed)
Per SN---  levaquin 500 mg  #10  1 daily until gone Take align once daily  I have called and spoke with [name] from hospice and she is aware of SN recs.  She will call the pts son and make him aware.

## 2017-02-07 ENCOUNTER — Telehealth: Payer: Self-pay | Admitting: Pulmonary Disease

## 2017-02-07 MED ORDER — HYDROMORPHONE HCL 1 MG/ML PO LIQD: ORAL | 0 refills | Status: AC

## 2017-02-07 NOTE — Telephone Encounter (Signed)
Spoke with pt with Joy, who states pt is having some some pain and agitation. Joy states pain seems to be all over, however pt is not very verbal. Caryl Asp states pt is not content with his current pain meds, and is requesting a new Rx to be sent to [facility] on [address]. Pt currently taking Hydrocodone 5-325 and Ativan 0.5mg . Joy request liquid, as pt is having difficulty swallowing.   SN please advise. Thanks.

## 2017-02-07 NOTE — Telephone Encounter (Signed)
Joy returning call -she can be reached at [phone] -pr

## 2017-02-07 NOTE — Telephone Encounter (Signed)
lmtcb x2 for [employer]

## 2017-02-07 NOTE — Telephone Encounter (Signed)
Per SN: okay for Dilaudid 5mg /27mL  2-4mg  (2-68mL) every 4hours as needed  #175mL  Called spoke with Joi, advised of SN's recs, she voiced her understanding.  [facility] [address] Called [facility] spoke with pharmacist - they do not have liquid Dilaudid in stock, will be Friday if ordered.  He recommended we send the Rx to a 24 hour pharmacy.  Called [facility] TCB x1 for Joi to get pharmacy she would like this sent to Rx printed - Dilaudid 1mg /73mL because this is the only strength available in epic Rx signed by SN and placed on 'sick message board' in triage

## 2017-02-07 NOTE — Telephone Encounter (Signed)
Joi, Hospice nurse, returning call, CB [phone].

## 2017-02-07 NOTE — Telephone Encounter (Signed)
LMTCB

## 2017-02-08 MED ORDER — UMECLIDINIUM-VILANTEROL 62.5-25 MCG/INH IN AEPB: 1.0000 | INHALATION_SPRAY | Freq: Every day | RESPIRATORY_TRACT | 0 refills | Status: DC

## 2017-02-09 ENCOUNTER — Telehealth: Payer: Self-pay | Admitting: Pulmonary Disease

## 2017-02-09 NOTE — Telephone Encounter (Signed)
SN is aware. Nothing further is needed.  

## 2017-02-12 ENCOUNTER — Telehealth: Payer: Self-pay

## 2017-02-12 NOTE — Telephone Encounter (Signed)
On 02/12/17 I received a death certificate from Richrd Humbles (original). The death certificate is for burial. The patient is a patient of Doctor Lenna Gilford. The death certificate will be taken to Pulmonary Unit @ [facility] this am for signature.  On 03/11/17 I received the death certificate back from Doctor Lenna Gilford. I got the death certificate ready and called the funeral home to let them know the death certificate is ready for pickup.

## 2017-02-23 NOTE — Telephone Encounter (Signed)
Called and spoke with Joy with hospice and she is aware of current pharmacys request.  Called and spoke with terry, pts son, and he is ok with sending this rx to [facility] [address].  I will call them at 9 to make sure they have the med in stock and then will contact terry back to give him an update.

## 2017-02-23 NOTE — Telephone Encounter (Signed)
Called both [facility] in pt's saved pharmacies and neither cary liquid Dilaudid. Called Joy and she states [facility] [address] will carry the Dilaudid. Rx faxed to preferred pharmacy with confirmation fax. Called and informed Coralyn Mark and Caryl Asp. Nothing further needed at this time.

## 2017-02-23 DEATH — deceased

## 2022-11-28 ENCOUNTER — Encounter: Payer: Self-pay | Admitting: Pulmonary Disease
